# Patient Record
Sex: Female | Born: 1950
Health system: Southern US, Community
[De-identification: ages and names within clinical notes are randomized; demographics above are authoritative.]

## PROBLEM LIST (undated history)

## (undated) DIAGNOSIS — I739 Peripheral vascular disease, unspecified: Secondary | ICD-10-CM

## (undated) DIAGNOSIS — Z794 Long term (current) use of insulin: Secondary | ICD-10-CM

## (undated) DIAGNOSIS — K5730 Diverticulosis of large intestine without perforation or abscess without bleeding: Secondary | ICD-10-CM

## (undated) DIAGNOSIS — D649 Anemia, unspecified: Secondary | ICD-10-CM

## (undated) DIAGNOSIS — E119 Type 2 diabetes mellitus without complications: Secondary | ICD-10-CM

## (undated) DIAGNOSIS — Z862 Personal history of diseases of the blood and blood-forming organs and certain disorders involving the immune mechanism: Secondary | ICD-10-CM

## (undated) DIAGNOSIS — N812 Incomplete uterovaginal prolapse: Secondary | ICD-10-CM

## (undated) DIAGNOSIS — Z972 Presence of dental prosthetic device (complete) (partial): Secondary | ICD-10-CM

## (undated) DIAGNOSIS — N8110 Cystocele, unspecified: Secondary | ICD-10-CM

## (undated) DIAGNOSIS — Z8619 Personal history of other infectious and parasitic diseases: Secondary | ICD-10-CM

## (undated) DIAGNOSIS — M1990 Unspecified osteoarthritis, unspecified site: Secondary | ICD-10-CM

## (undated) DIAGNOSIS — G629 Polyneuropathy, unspecified: Secondary | ICD-10-CM

## (undated) DIAGNOSIS — Z973 Presence of spectacles and contact lenses: Secondary | ICD-10-CM

## (undated) DIAGNOSIS — G4733 Obstructive sleep apnea (adult) (pediatric): Secondary | ICD-10-CM

## (undated) DIAGNOSIS — K08109 Complete loss of teeth, unspecified cause, unspecified class: Secondary | ICD-10-CM

## (undated) DIAGNOSIS — G5600 Carpal tunnel syndrome, unspecified upper limb: Secondary | ICD-10-CM

## (undated) DIAGNOSIS — N182 Chronic kidney disease, stage 2 (mild): Secondary | ICD-10-CM

## (undated) DIAGNOSIS — I10 Essential (primary) hypertension: Secondary | ICD-10-CM

## (undated) DIAGNOSIS — J449 Chronic obstructive pulmonary disease, unspecified: Secondary | ICD-10-CM

## (undated) DIAGNOSIS — E785 Hyperlipidemia, unspecified: Secondary | ICD-10-CM

## (undated) DIAGNOSIS — G4730 Sleep apnea, unspecified: Secondary | ICD-10-CM

## (undated) DIAGNOSIS — I2510 Atherosclerotic heart disease of native coronary artery without angina pectoris: Secondary | ICD-10-CM

## (undated) HISTORY — PX: WRIST SURGERY: SHX841

## (undated) HISTORY — DX: Anemia, unspecified: D64.9

## (undated) HISTORY — DX: Type 2 diabetes mellitus without complications: E11.9

## (undated) HISTORY — DX: Peripheral vascular disease, unspecified: I73.9

## (undated) HISTORY — DX: Sleep apnea, unspecified: G47.30

## (undated) HISTORY — DX: Atherosclerotic heart disease of native coronary artery without angina pectoris: I25.10

## (undated) HISTORY — PX: APPENDECTOMY: SHX54

## (undated) HISTORY — DX: Carpal tunnel syndrome, unspecified upper limb: G56.00

## (undated) HISTORY — DX: Essential (primary) hypertension: I10

## (undated) HISTORY — DX: Unspecified osteoarthritis, unspecified site: M19.90

## (undated) HISTORY — PX: OTHER SURGICAL HISTORY: SHX169

## (undated) HISTORY — PX: BREAST EXCISIONAL BIOPSY: SUR124

## (undated) HISTORY — PX: CORONARY ANGIOPLASTY WITH STENT PLACEMENT: SHX49

## (undated) HISTORY — DX: Hyperlipidemia, unspecified: E78.5

## (undated) HISTORY — PX: TONSILLECTOMY: SUR1361

---

## 2001-06-23 HISTORY — PX: APPENDECTOMY: SHX54

## 2003-06-24 DIAGNOSIS — I2510 Atherosclerotic heart disease of native coronary artery without angina pectoris: Secondary | ICD-10-CM

## 2003-06-24 HISTORY — DX: Atherosclerotic heart disease of native coronary artery without angina pectoris: I25.10

## 2003-06-24 HISTORY — PX: CARPAL TUNNEL RELEASE: SHX101

## 2003-10-11 ENCOUNTER — Inpatient Hospital Stay (HOSPITAL_COMMUNITY): Admission: EM | Admit: 2003-10-11 | Discharge: 2003-10-12 | Payer: Self-pay | Admitting: Emergency Medicine

## 2004-02-07 ENCOUNTER — Ambulatory Visit (HOSPITAL_COMMUNITY): Admission: RE | Admit: 2004-02-07 | Discharge: 2004-02-07 | Payer: Self-pay | Admitting: Family Medicine

## 2004-03-14 ENCOUNTER — Ambulatory Visit (HOSPITAL_COMMUNITY): Admission: RE | Admit: 2004-03-14 | Discharge: 2004-03-14 | Payer: Self-pay | Admitting: Infectious Diseases

## 2004-05-19 ENCOUNTER — Inpatient Hospital Stay (HOSPITAL_COMMUNITY): Admission: EM | Admit: 2004-05-19 | Discharge: 2004-05-25 | Payer: Self-pay | Admitting: Emergency Medicine

## 2004-05-19 ENCOUNTER — Ambulatory Visit: Payer: Self-pay | Admitting: Internal Medicine

## 2004-05-19 ENCOUNTER — Ambulatory Visit: Payer: Self-pay | Admitting: Cardiology

## 2004-05-21 ENCOUNTER — Ambulatory Visit: Payer: Self-pay | Admitting: Cardiology

## 2004-05-21 ENCOUNTER — Ambulatory Visit: Payer: Self-pay | Admitting: Internal Medicine

## 2004-05-21 HISTORY — PX: CARDIAC CATHETERIZATION: SHX172

## 2004-05-23 DIAGNOSIS — Z955 Presence of coronary angioplasty implant and graft: Secondary | ICD-10-CM

## 2004-05-23 HISTORY — DX: Presence of coronary angioplasty implant and graft: Z95.5

## 2004-05-24 HISTORY — PX: CORONARY ANGIOPLASTY WITH STENT PLACEMENT: SHX49

## 2004-06-23 ENCOUNTER — Emergency Department (HOSPITAL_COMMUNITY): Admission: EM | Admit: 2004-06-23 | Discharge: 2004-06-23 | Payer: Self-pay | Admitting: Emergency Medicine

## 2004-06-25 ENCOUNTER — Ambulatory Visit: Payer: Self-pay | Admitting: Cardiology

## 2004-07-08 ENCOUNTER — Ambulatory Visit: Payer: Self-pay | Admitting: Dentistry

## 2004-07-08 ENCOUNTER — Encounter: Admission: RE | Admit: 2004-07-08 | Discharge: 2004-07-08 | Payer: Self-pay | Admitting: Dentistry

## 2004-07-30 ENCOUNTER — Ambulatory Visit: Payer: Self-pay | Admitting: Internal Medicine

## 2004-08-27 ENCOUNTER — Ambulatory Visit: Payer: Self-pay | Admitting: Cardiology

## 2004-08-27 ENCOUNTER — Inpatient Hospital Stay (HOSPITAL_COMMUNITY): Admission: EM | Admit: 2004-08-27 | Discharge: 2004-08-29 | Payer: Self-pay | Admitting: *Deleted

## 2004-09-17 ENCOUNTER — Ambulatory Visit: Payer: Self-pay | Admitting: Internal Medicine

## 2004-11-05 ENCOUNTER — Ambulatory Visit: Payer: Self-pay | Admitting: Internal Medicine

## 2005-01-06 ENCOUNTER — Emergency Department (HOSPITAL_COMMUNITY): Admission: EM | Admit: 2005-01-06 | Discharge: 2005-01-06 | Payer: Self-pay | Admitting: Emergency Medicine

## 2005-01-16 ENCOUNTER — Ambulatory Visit: Payer: Self-pay | Admitting: Cardiology

## 2005-01-29 ENCOUNTER — Emergency Department (HOSPITAL_COMMUNITY): Admission: EM | Admit: 2005-01-29 | Discharge: 2005-01-29 | Payer: Self-pay | Admitting: Family Medicine

## 2005-02-05 ENCOUNTER — Ambulatory Visit: Payer: Self-pay | Admitting: Internal Medicine

## 2005-04-02 ENCOUNTER — Ambulatory Visit: Payer: Self-pay | Admitting: Internal Medicine

## 2005-04-25 ENCOUNTER — Emergency Department (HOSPITAL_COMMUNITY): Admission: EM | Admit: 2005-04-25 | Discharge: 2005-04-25 | Payer: Self-pay | Admitting: *Deleted

## 2005-05-22 ENCOUNTER — Ambulatory Visit (HOSPITAL_COMMUNITY): Admission: RE | Admit: 2005-05-22 | Discharge: 2005-05-22 | Payer: Self-pay | Admitting: Internal Medicine

## 2005-06-05 ENCOUNTER — Ambulatory Visit: Payer: Self-pay | Admitting: Cardiology

## 2005-09-12 ENCOUNTER — Emergency Department (HOSPITAL_COMMUNITY): Admission: EM | Admit: 2005-09-12 | Discharge: 2005-09-12 | Payer: Self-pay | Admitting: Emergency Medicine

## 2005-09-18 ENCOUNTER — Emergency Department (HOSPITAL_COMMUNITY): Admission: EM | Admit: 2005-09-18 | Discharge: 2005-09-18 | Payer: Self-pay | Admitting: Emergency Medicine

## 2005-09-22 ENCOUNTER — Emergency Department (HOSPITAL_COMMUNITY): Admission: EM | Admit: 2005-09-22 | Discharge: 2005-09-22 | Payer: Self-pay | Admitting: Emergency Medicine

## 2006-04-20 ENCOUNTER — Ambulatory Visit: Payer: Self-pay | Admitting: Cardiovascular Disease

## 2006-04-20 ENCOUNTER — Observation Stay (HOSPITAL_COMMUNITY): Admission: EM | Admit: 2006-04-20 | Discharge: 2006-04-20 | Payer: Self-pay | Admitting: Emergency Medicine

## 2006-04-20 HISTORY — PX: CARDIAC CATHETERIZATION: SHX172

## 2006-04-22 ENCOUNTER — Ambulatory Visit: Payer: Self-pay | Admitting: Cardiology

## 2006-05-04 ENCOUNTER — Ambulatory Visit: Payer: Self-pay | Admitting: Cardiovascular Disease

## 2006-07-27 ENCOUNTER — Ambulatory Visit: Payer: Self-pay | Admitting: Cardiology

## 2006-07-27 ENCOUNTER — Emergency Department (HOSPITAL_COMMUNITY): Admission: EM | Admit: 2006-07-27 | Discharge: 2006-07-27 | Payer: Self-pay | Admitting: Emergency Medicine

## 2006-07-30 ENCOUNTER — Ambulatory Visit: Payer: Self-pay | Admitting: Cardiology

## 2006-10-08 ENCOUNTER — Emergency Department (HOSPITAL_COMMUNITY): Admission: EM | Admit: 2006-10-08 | Discharge: 2006-10-08 | Payer: Self-pay | Admitting: Emergency Medicine

## 2006-10-09 ENCOUNTER — Ambulatory Visit (HOSPITAL_COMMUNITY): Admission: RE | Admit: 2006-10-09 | Discharge: 2006-10-09 | Payer: Self-pay | Admitting: Internal Medicine

## 2007-02-03 ENCOUNTER — Ambulatory Visit (HOSPITAL_COMMUNITY): Admission: RE | Admit: 2007-02-03 | Discharge: 2007-02-03 | Payer: Self-pay | Admitting: Gastroenterology

## 2007-02-03 ENCOUNTER — Encounter (INDEPENDENT_AMBULATORY_CARE_PROVIDER_SITE_OTHER): Payer: Self-pay | Admitting: Gastroenterology

## 2007-05-05 ENCOUNTER — Encounter: Admission: RE | Admit: 2007-05-05 | Discharge: 2007-05-05 | Payer: Self-pay | Admitting: Internal Medicine

## 2007-07-16 ENCOUNTER — Ambulatory Visit: Payer: Self-pay | Admitting: Cardiovascular Disease

## 2007-07-16 ENCOUNTER — Observation Stay (HOSPITAL_COMMUNITY): Admission: EM | Admit: 2007-07-16 | Discharge: 2007-07-17 | Payer: Self-pay | Admitting: Emergency Medicine

## 2007-08-12 ENCOUNTER — Ambulatory Visit: Payer: Self-pay | Admitting: Cardiology

## 2007-10-22 ENCOUNTER — Emergency Department (HOSPITAL_COMMUNITY): Admission: EM | Admit: 2007-10-22 | Discharge: 2007-10-22 | Payer: Self-pay | Admitting: Emergency Medicine

## 2007-11-22 ENCOUNTER — Emergency Department (HOSPITAL_COMMUNITY): Admission: EM | Admit: 2007-11-22 | Discharge: 2007-11-22 | Payer: Self-pay | Admitting: Emergency Medicine

## 2008-03-06 ENCOUNTER — Ambulatory Visit (HOSPITAL_COMMUNITY): Admission: RE | Admit: 2008-03-06 | Discharge: 2008-03-06 | Payer: Self-pay | Admitting: Internal Medicine

## 2008-04-20 ENCOUNTER — Encounter: Admission: RE | Admit: 2008-04-20 | Discharge: 2008-04-20 | Payer: Self-pay | Admitting: Internal Medicine

## 2008-07-03 DIAGNOSIS — F17200 Nicotine dependence, unspecified, uncomplicated: Secondary | ICD-10-CM | POA: Insufficient documentation

## 2008-07-04 ENCOUNTER — Ambulatory Visit: Payer: Self-pay | Admitting: Cardiology

## 2008-07-05 DIAGNOSIS — I10 Essential (primary) hypertension: Secondary | ICD-10-CM | POA: Insufficient documentation

## 2008-07-05 DIAGNOSIS — I2510 Atherosclerotic heart disease of native coronary artery without angina pectoris: Secondary | ICD-10-CM | POA: Insufficient documentation

## 2008-07-07 ENCOUNTER — Ambulatory Visit: Payer: Self-pay

## 2008-07-15 ENCOUNTER — Emergency Department (HOSPITAL_COMMUNITY): Admission: EM | Admit: 2008-07-15 | Discharge: 2008-07-15 | Payer: Self-pay | Admitting: Emergency Medicine

## 2008-10-03 ENCOUNTER — Emergency Department (HOSPITAL_COMMUNITY): Admission: EM | Admit: 2008-10-03 | Discharge: 2008-10-03 | Payer: Self-pay | Admitting: Emergency Medicine

## 2008-12-27 ENCOUNTER — Encounter (INDEPENDENT_AMBULATORY_CARE_PROVIDER_SITE_OTHER): Payer: Self-pay | Admitting: Internal Medicine

## 2008-12-27 ENCOUNTER — Ambulatory Visit (HOSPITAL_COMMUNITY): Admission: RE | Admit: 2008-12-27 | Discharge: 2008-12-27 | Payer: Self-pay | Admitting: Internal Medicine

## 2008-12-27 ENCOUNTER — Ambulatory Visit: Payer: Self-pay | Admitting: Surgery

## 2009-02-19 ENCOUNTER — Encounter (INDEPENDENT_AMBULATORY_CARE_PROVIDER_SITE_OTHER): Payer: Self-pay | Admitting: *Deleted

## 2009-05-01 ENCOUNTER — Ambulatory Visit (HOSPITAL_COMMUNITY): Admission: RE | Admit: 2009-05-01 | Discharge: 2009-05-01 | Payer: Self-pay | Admitting: Internal Medicine

## 2009-05-04 ENCOUNTER — Ambulatory Visit: Payer: Self-pay | Admitting: Cardiovascular Disease

## 2009-05-04 ENCOUNTER — Inpatient Hospital Stay (HOSPITAL_COMMUNITY): Admission: EM | Admit: 2009-05-04 | Discharge: 2009-05-05 | Payer: Self-pay | Admitting: Emergency Medicine

## 2009-05-09 ENCOUNTER — Encounter: Admission: RE | Admit: 2009-05-09 | Discharge: 2009-05-09 | Payer: Self-pay | Admitting: Internal Medicine

## 2009-05-09 ENCOUNTER — Telehealth (INDEPENDENT_AMBULATORY_CARE_PROVIDER_SITE_OTHER): Payer: Self-pay

## 2009-05-10 ENCOUNTER — Encounter: Payer: Self-pay | Admitting: Cardiology

## 2009-05-10 ENCOUNTER — Encounter (HOSPITAL_COMMUNITY): Admission: RE | Admit: 2009-05-10 | Discharge: 2009-06-22 | Payer: Self-pay | Admitting: Cardiology

## 2009-05-10 ENCOUNTER — Ambulatory Visit: Payer: Self-pay | Admitting: Internal Medicine

## 2009-05-10 ENCOUNTER — Ambulatory Visit: Payer: Self-pay

## 2009-05-22 ENCOUNTER — Encounter: Payer: Self-pay | Admitting: Internal Medicine

## 2009-10-30 ENCOUNTER — Ambulatory Visit: Payer: Self-pay | Admitting: Internal Medicine

## 2009-10-30 ENCOUNTER — Encounter (INDEPENDENT_AMBULATORY_CARE_PROVIDER_SITE_OTHER): Payer: Self-pay | Admitting: Family Medicine

## 2009-10-30 LAB — CONVERTED CEMR LAB
ALT: 13 units/L (ref 0–35)
Alkaline Phosphatase: 95 units/L (ref 39–117)
BUN: 10 mg/dL (ref 6–23)
Basophils Absolute: 0 10*3/uL (ref 0.0–0.1)
Basophils Relative: 0 % (ref 0–1)
CO2: 28 meq/L (ref 19–32)
Calcium: 9 mg/dL (ref 8.4–10.5)
Chloride: 105 meq/L (ref 96–112)
Eosinophils Absolute: 0 10*3/uL (ref 0.0–0.7)
Eosinophils Relative: 0 % (ref 0–5)
Glucose, Bld: 75 mg/dL (ref 70–99)
HCT: 37.7 % (ref 36.0–46.0)
HDL: 66 mg/dL (ref >39)
Hemoglobin: 12.2 g/dL (ref 12.0–15.0)
Lymphs Abs: 2.7 10*3/uL (ref 0.7–4.0)
MCV: 75.2 fL — ABNORMAL LOW (ref 78.0–100.0)
Neutro Abs: 2.7 10*3/uL (ref 1.7–7.7)
Potassium: 4 meq/L (ref 3.5–5.3)
RBC: 5.01 M/uL (ref 3.87–5.11)
RDW: 16 % — ABNORMAL HIGH (ref 11.5–15.5)
TSH: 0.71 microintl units/mL (ref 0.350–4.500)
Total Bilirubin: 0.3 mg/dL (ref 0.3–1.2)
Total CHOL/HDL Ratio: 2.6
Triglycerides: 77 mg/dL (ref <150)

## 2009-10-31 ENCOUNTER — Ambulatory Visit: Payer: Self-pay | Admitting: Internal Medicine

## 2009-11-28 ENCOUNTER — Ambulatory Visit: Payer: Self-pay | Admitting: Internal Medicine

## 2009-12-17 ENCOUNTER — Ambulatory Visit: Payer: Self-pay | Admitting: Internal Medicine

## 2010-02-11 ENCOUNTER — Ambulatory Visit: Payer: Self-pay | Admitting: Internal Medicine

## 2010-02-22 ENCOUNTER — Ambulatory Visit: Payer: Self-pay | Admitting: Internal Medicine

## 2010-02-23 ENCOUNTER — Emergency Department (HOSPITAL_COMMUNITY): Admission: EM | Admit: 2010-02-23 | Discharge: 2010-02-24 | Payer: Self-pay | Admitting: Emergency Medicine

## 2010-05-02 ENCOUNTER — Ambulatory Visit (HOSPITAL_COMMUNITY): Admission: RE | Admit: 2010-05-02 | Discharge: 2010-05-02 | Payer: Self-pay | Admitting: Internal Medicine

## 2010-05-28 ENCOUNTER — Ambulatory Visit (HOSPITAL_COMMUNITY)
Admission: RE | Admit: 2010-05-28 | Discharge: 2010-05-28 | Payer: Self-pay | Source: Home / Self Care | Admitting: Internal Medicine

## 2010-07-14 ENCOUNTER — Encounter: Payer: Self-pay | Admitting: Internal Medicine

## 2010-09-16 ENCOUNTER — Encounter (INDEPENDENT_AMBULATORY_CARE_PROVIDER_SITE_OTHER): Payer: Self-pay | Admitting: *Deleted

## 2010-09-16 LAB — CONVERTED CEMR LAB
ALT: 15 units/L (ref 0–35)
Alkaline Phosphatase: 97 units/L (ref 39–117)
Basophils Absolute: 0 10*3/uL (ref 0.0–0.1)
Basophils Relative: 0 % (ref 0–1)
CO2: 28 meq/L (ref 19–32)
Calcium: 9.7 mg/dL (ref 8.4–10.5)
Cholesterol: 155 mg/dL (ref 0–200)
Eosinophils Absolute: 0 10*3/uL (ref 0.0–0.7)
Eosinophils Relative: 1 % (ref 0–5)
Hemoglobin: 12.2 g/dL (ref 12.0–15.0)
Hgb A1c MFr Bld: 6.3 % — ABNORMAL HIGH (ref <5.7)
LDL Cholesterol: 65 mg/dL (ref 0–99)
Monocytes Absolute: 0.6 10*3/uL (ref 0.1–1.0)
Monocytes Relative: 9 % (ref 3–12)
Platelets: 283 10*3/uL (ref 150–400)
RBC: 5.11 M/uL (ref 3.87–5.11)
Sodium: 143 meq/L (ref 135–145)
Total Bilirubin: 0.3 mg/dL (ref 0.3–1.2)
Triglycerides: 197 mg/dL — ABNORMAL HIGH (ref <150)
VLDL: 39 mg/dL (ref 0–40)
WBC: 6.6 10*3/uL (ref 4.0–10.5)

## 2010-09-25 LAB — BASIC METABOLIC PANEL
BUN: 20 mg/dL (ref 6–23)
Creatinine, Ser: 1.04 mg/dL (ref 0.4–1.2)
Glucose, Bld: 81 mg/dL (ref 70–99)
Sodium: 142 mEq/L (ref 135–145)

## 2010-09-25 LAB — CK TOTAL AND CKMB (NOT AT ARMC)
CK, MB: 1.7 ng/mL (ref 0.3–4.0)
Relative Index: 1.1 (ref 0.0–2.5)
Total CK: 149 U/L (ref 7–177)

## 2010-09-25 LAB — DIFFERENTIAL
Basophils Absolute: 0 10*3/uL (ref 0.0–0.1)
Basophils Relative: 0 % (ref 0–1)
Eosinophils Relative: 2 % (ref 0–5)
Lymphs Abs: 3.1 10*3/uL (ref 0.7–4.0)
Monocytes Absolute: 0.7 10*3/uL (ref 0.1–1.0)
Monocytes Relative: 9 % (ref 3–12)
Neutro Abs: 4.2 10*3/uL (ref 1.7–7.7)
Neutrophils Relative %: 51 % (ref 43–77)

## 2010-09-25 LAB — CARDIAC PANEL(CRET KIN+CKTOT+MB+TROPI)
Relative Index: 1 (ref 0.0–2.5)
Total CK: 130 U/L (ref 7–177)
Troponin I: 0.01 ng/mL (ref 0.00–0.06)

## 2010-09-25 LAB — LIPID PANEL
HDL: 58 mg/dL (ref >39)
Total CHOL/HDL Ratio: 3.1 RATIO
VLDL: 34 mg/dL (ref 0–40)

## 2010-09-25 LAB — POCT CARDIAC MARKERS
CKMB, poc: 1.4 ng/mL (ref 1.0–8.0)
CKMB, poc: 1.5 ng/mL (ref 1.0–8.0)
Myoglobin, poc: 64.2 ng/mL (ref 12–200)
Troponin i, poc: <0.05 ng/mL (ref 0.00–0.09)
Troponin i, poc: <0.05 ng/mL (ref 0.00–0.09)

## 2010-09-25 LAB — GLUCOSE, CAPILLARY
Glucose-Capillary: 127 mg/dL — ABNORMAL HIGH (ref 70–99)
Glucose-Capillary: 132 mg/dL — ABNORMAL HIGH (ref 70–99)

## 2010-09-25 LAB — HEMOGLOBIN A1C
Hgb A1c MFr Bld: 6.3 % — ABNORMAL HIGH (ref 4.6–6.1)
Mean Plasma Glucose: 134 mg/dL

## 2010-09-25 LAB — T4, FREE: Free T4: 1.3 ng/dL (ref 0.80–1.80)

## 2010-09-25 LAB — CBC
Platelets: 248 10*3/uL (ref 150–400)
RBC: 4.82 MIL/uL (ref 3.87–5.11)
WBC: 8.2 10*3/uL (ref 4.0–10.5)

## 2010-10-02 LAB — POCT CARDIAC MARKERS
CKMB, poc: <1 ng/mL — ABNORMAL LOW (ref 1.0–8.0)
Myoglobin, poc: 40.6 ng/mL (ref 12–200)
Myoglobin, poc: 41.1 ng/mL (ref 12–200)

## 2010-10-02 LAB — DIFFERENTIAL
Basophils Relative: 1 % (ref 0–1)
Eosinophils Absolute: 0.2 10*3/uL (ref 0.0–0.7)
Eosinophils Relative: 2 % (ref 0–5)
Lymphs Abs: 2.8 10*3/uL (ref 0.7–4.0)
Monocytes Relative: 6 % (ref 3–12)
Neutrophils Relative %: 52 % (ref 43–77)

## 2010-10-02 LAB — CBC
HCT: 34.5 % — ABNORMAL LOW (ref 36.0–46.0)
MCHC: 32.5 g/dL (ref 30.0–36.0)
MCV: 76.8 fL — ABNORMAL LOW (ref 78.0–100.0)
Platelets: 242 10*3/uL (ref 150–400)
RBC: 4.49 MIL/uL (ref 3.87–5.11)

## 2010-10-02 LAB — BASIC METABOLIC PANEL
BUN: 18 mg/dL (ref 6–23)
CO2: 30 mEq/L (ref 19–32)
Chloride: 103 mEq/L (ref 96–112)
Creatinine, Ser: 0.94 mg/dL (ref 0.4–1.2)
Potassium: 3.2 mEq/L — ABNORMAL LOW (ref 3.5–5.1)

## 2010-10-07 LAB — URINALYSIS, ROUTINE W REFLEX MICROSCOPIC
Bilirubin Urine: NEGATIVE
Glucose, UA: NEGATIVE mg/dL
Hgb urine dipstick: NEGATIVE
Ketones, ur: NEGATIVE mg/dL
Nitrite: NEGATIVE
Protein, ur: NEGATIVE mg/dL
Specific Gravity, Urine: 1.014 (ref 1.005–1.030)
Urobilinogen, UA: 0.2 mg/dL (ref 0.0–1.0)
pH: 7 (ref 5.0–8.0)

## 2010-10-07 LAB — URINE MICROSCOPIC-ADD ON

## 2010-11-05 NOTE — Assessment & Plan Note (Signed)
[facility]                            CARDIOLOGY OFFICE NOTE   NAME:[name] [name], [name] [name]                       MRN:          [id number]  DATE:07/04/2008                            DOB:          Sep 23, 1950    PRIMARY CARE PHYSICIAN:  Dr. Candyce Churn. Allyne Gee, M.D.   REASON FOR PRESENTATION:  Evaluate patient with coronary disease and  chest pain.   HISTORY OF PRESENT ILLNESS:  The patient presents for followup of the  above.  She is 60 years old now.  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  She was complaining of stress and fatigue and some  chest discomfort.  She was added to this schedule.  She said that she  had some chest discomfort in mid-December.  She actually takes  nitroglycerin.  She took 2 in 1 day.  She has not had to have any since  then.  She said this was a mild discomfort.  It was somewhat sharp.  She  was not sure whether this was similar to the previous angina.  There  were no associated symptoms.  It did go away after the second  nitroglycerin.  This was happening at rest.  She states she has been  under a lot of stress with her children and grandchildren.  She was  tearful talking about this.  She has not been particularly physically  active.  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  She feels chronically fatigued.  She says she is having  shortness of breath with moderate activity.  She is not having any  shortness of breath at rest and she is not describing any PND or  orthopnea.  She does continue to smoke about 3 cigarettes a day,  unfortunately.   PAST MEDICAL HISTORY:  Coronary artery disease. (40% left main stenosis,  LAD 50% - 60% stenosis, circumflex 30% proximal stenosis, right coronary  artery 80% stenosis followed by 50%, followed by 60% stenosis.  EF of  60%.  She had a Cypher stent placed in her right coronary artery in  March 2005.  She had a followup catheterization in 2007,  demonstrating  the stent to be widely patent).  1. Iron deficiency anemia.  2. Arthritis.  3. Hypertension.  4. Ongoing tobacco use.  5. Carpal tunnel syndrome.  6. Diabetes.   PAST SURGICAL HISTORY:  Appendectomy.   ALLERGIES:  None.   MEDICATIONS:  1. Calcium.  2. Multivitamins.  3. Aspirin 81 mg daily.  4. Lopressor 50 mg b.i.d.  5. Iron.  6. Crestor 10 mg daily.  7. Janumet.  8. Benicar 40/25 daily.  9. Amlodipine 10 mg daily.   REVIEW OF SYSTEMS:  As stated in the HPI, and otherwise negative for all  other systems.   PHYSICAL EXAMINATION:  GENERAL:  The patient is pleasant and in no  distress.  VITAL SIGNS:  Blood pressure 130/60, heart rate 70 and regular.  HEENT:  Eyes are unremarkable; pupils equal, round, and reactive to  light; fundi not visualized, oral mucosa unremarkable.  NECK:  No jugular venous distension at 45 degrees, carotid upstroke  brisk and symmetrical.  No bruits.  No thyromegaly.  LYMPHATICS:  No cervical, axillary, or inguinal adenopathy.  LUNGS:  Clear to auscultation bilaterally.  BACK:  No costovertebral angle tenderness.  CHEST:  Unremarkable.  HEART:  PMI not displaced or sustained.  S1 and S2 are within normal  limits.  No S3, no S4.  No clicks, no rubs, no murmurs.  ABDOMEN:  Flat, positive bowel sounds, normal in frequency and pitch.  No bruits, no rebound, no guarding.  No midline pulsatile mass.  No  hepatomegaly or splenomegaly.  SKIN:  No rashes, no nodules.  EXTREMITIES:  Pulses 2+, varicose veins.  No cyanosis, no clubbing, no  edema.  NEURO:  Oriented to person, place, and time.  Cranial nerves II through  XII grossly intact.  Motor grossly intact.   ASSESSMENT/PLAN:  1. Chest.  This patient does have chest discomfort with ongoing      cardiovascular risk factors and known coronary disease.  The pre-      test probability of obstructive coronary artery disease is at least      moderate.  Therefore, she will have a stress  perfusion study.      Further evaluation will be based on these results.  She will      continue on progressive risk reduction.  2. Dyslipidemia per Dr. Allyne Gee.  The goal will be an LDL less than      100 and HDL greater than 50.  3. Tobacco abuse.  I discussed with her the need to stop smoking.  I      spent greater than 3 minutes discussing this.  She does not want to      try Chantix, because she says  I am already depressed.  4. Depression.  I have asked her to talk this over with her primary      care doctor.  I do think that the stress of the graveyard shift      along with her medical problems is not healthy for her, and I have      written a letter to [employer] at [facility], to see if she can get      moved to a different shift.  5. Followup.  I will see her again in 6 months or sooner if needed.    Rollene Rotunda, MD, [facility]  Electronically Signed   JH/MedQ  DD: 07/06/2008  DT: 07/07/2008  Job #: [id number]   cc:   Candyce Churn. Allyne Gee, M.D.

## 2010-11-05 NOTE — Discharge Summary (Signed)
NAME:  [name] [name], [name] [name]              ACCOUNT NO.:  [id number]   MEDICAL RECORD NO.:  [id number]          PATIENT TYPE:  INP   LOCATION:  3736                         FACILITY:  [facility]   PHYSICIAN:  Peter C. Eden Emms, MD, FACCDATE OF BIRTH:  1951-04-11   DATE OF ADMISSION:  07/16/2007  DATE OF DISCHARGE:  07/17/2007                               DISCHARGE SUMMARY   PRIMARY CARDIOLOGIST:  Rollene Rotunda, MD.   PRIMARY CARE PHYSICIAN:  [name], MD.   PROCEDURES PERFORMED DURING HOSPITALIZATION:  None.   FINAL DISCHARGE DIAGNOSES:  1. Noncardiac chest pain.  2. Known coronary artery disease status post diethylstilbestrol to the      right coronary artery in December of 2005.      a.     Last cardiac catheterization April 20, 2006, revealing       minimal coronary artery disease with patent right coronary artery       stent.      b.     Last stress Myoview dated August 27, 2004, revealing an       ejection fraction of 66 percent with no ischemia.   SECONDARY DIAGNOSES:  1. Iron deficiency anemia.      a.     Status post esophagogastroduodenoscopy on January 24, 2007,       revealing small hiatal hernia.      b.     Status post colonoscopy August 05, 2006, revealing colon       polyps.  2. Arthritis.  3. Hypertension.  4. Hyperlipidemia.  5. Type 2 diabetes.  6. Carpal tunnel.  7. Chronic varicose veins.   HOSPITAL COURSE:  This is a 60 year old African-American female who  presented to [facility] Emergency Room with complaints of chest pain  described as left chest discomfort under breasts, radiating to left  shoulder and arm, occurring x2 weeks and described as sharp, lasting a  minute to 2 minutes several times a day, with and without exertion.  Nitroglycerin gave minimal relief.  She began to feel uncomfortable  around 4:30 in the morning and continued to have discomfort until 6:30  in the morning and came to the emergency room.  The patient admits to  social issues.   Stressors include finances and current bills and a  recent death of her sister.  The patient was admitted to rule out  myocardial infarction with cardiac enzymes cycled.  Troponin was  negative at 0.05 initially.  Followup troponins were also negative at  0.01, 0.01 and 0.01.  The patient also was checked for occult blood and  was found to be negative.  The patient's hemoglobin A1c was also checked  and was found to be high normal at 6.1.  The patients EKG revealed  nothing acute as far as ischemic event.  EKG revealed normal sinus  rhythm.   The patient was seen and examined by Dr. Charlton Haws on day of  discharge, found to be stable with blood pressure 129/85, heart rate 63,  respirations 20.  The patient had no further chest discomfort and was  diagnosed with atypical chest pain  not related to CAD.  The patient will  follow up with Dr. Antoine Poche in a couple of weeks post discharge and  continue her current medications.   DISCHARGE LABS:  Cholesterol 94, lipids 105, HDL 22, LDL 51, sodium 139,  potassium 3.4, chloride 101, CO2 29, glucose 140, BUN 13, creatinine  0.93.  Hemoglobin 11.1, hematocrit 34.4, white blood cells 7.7 and  platelets 273,000.  PTT 33, PT 12.6, INR 0.9.   VITAL SIGNS ON DISCHARGE:  As described.   DISCHARGE MEDICATIONS:  1. Metoprolol 50 mg twice a day.  2. DynaCirc CR 10 mg daily.  3. Benicar/HCTZ 40/25 mg daily.  4. Crestor 10 mg daily.  5. Feosol 325 mg three times a day.  6. Janumet 500/100 daily.  7. Bayer aspirin 325 mg daily.  8. Os-Cal D 500 mg daily.   ALLERGIES:  No known drug allergies.   FOLLOWUP PLANS AND APPOINTMENT:  1. The patient will follow with Dr. Antoine Poche in approximately 2 weeks.      Our office will call, as this is a weekend and they will schedule      that appointment for her by phone.  2. She is to follow up with her primary care physician for continued      medical management.   Time spent with the patient, to include  physician time, 30 minutes.      Bettey Mare. Lyman Bishop, NP      Noralyn Pick. Eden Emms, MD, [facility]  Electronically Signed    KML/MEDQ  D:  07/17/2007  T:  07/18/2007  Job:  [id number]   cc:   Candyce Churn. Allyne Gee, M.D.

## 2010-11-05 NOTE — Assessment & Plan Note (Signed)
[facility]                            CARDIOLOGY OFFICE NOTE   NAME:[name] [name], [name] [name]                       MRN:          [id number]  DATE:08/12/2007                            DOB:          10-08-1950    REASON FOR PRESENTATION:  Evaluate the patient with recent  hospitalization chest pain.   HISTORY OF PRESENT ILLNESS:  The patient was hospitalized on January 23  overnight for chest discomfort.  She had no objective evidence of  ischemia.  No further testing was done at that time.  Since then she has  had no further chest discomfort.  She has had no shortness of breath.  Denies any PND or orthopnea.  She had no palpitations, presyncope or  syncope.   PAST MEDICAL HISTORY:  Coronary artery disease (40% left main stenosis,  LAD 50-60% stenosis, circumflex 30% proximal stenosis, right coronary  artery 80% stenosis, followed by 50% followed by 60% stenosis.  EF 60%.  The patient has a CYPHER stent placed her right coronary artery March  2005.  She had a follow-up catheterization 2007 demonstrating the stent  to be wide open), iron-deficiency anemia, arthritis, hypertension,  tobacco use, carpal tunnel syndrome, appendectomy.   ALLERGIES:  None.   MEDICATIONS:  Calcium, multivitamin, aspirin, Lopressor 50 mg b.i.d.,  iron, Crestor 10 mg daily, Janumet, Benicar 40/25 daily, amlodipine 10  mg daily.   REVIEW OF SYSTEMS:  As stated in the HPI otherwise negative for other  systems.   PHYSICAL EXAMINATION:  The patient is in no distress.  Blood pressure 140/100, heart rate 70 and regular.  HEENT:  Eyelids unremarkable.  Pupils are equal, round, and reactive to  light and accommodation.  Fundi are not visualized.  Oral mucosa  unremarkable.  NECK:  No jugular venous distension at 45 degrees, carotid upstroke  brisk and symmetric, no bruits, thyromegaly.  LYMPHATICS:  No cervical, axillary, or inguinal adenopathy.  LUNGS:  Clear to auscultation  bilaterally.  BACK:  No costovertebral angle tenderness.  CHEST:  Unremarkable.  HEART:  PMI not displaced or sustained, S1 and S2 within normal limits,  no S3, no S4, no clicks, rubs, murmurs.  ABDOMEN:  Flat, positive bowel sounds, normal in frequency and pitch, no  bruits, rebound, guarding.  No midline pulsatile mass, hepatomegaly,  splenomegaly.  SKIN:  No rashes, no nodules.  EXTREMITIES:  With 2+ pulses throughout, no edema, cyanosis, clubbing.  NEURO:  Oriented to person, place, and time, cranial nerves 2-12 grossly  intact, motor grossly intact.    EKG sinus rhythm, rate 70, axis within normal limits, intervals within  normal limits, no acute ST-T wave changes.   ASSESSMENT/PLAN:  1. Coronary disease.  Patient is having no ongoing symptoms.  No      further cardiovascular testing is suggested.  She will continue      with risk reduction.  2. Hypertension.  Blood pressure is mildly elevated but had not been      on previous evaluations in the hospital.  She can keep a check on      this at home.  She does report that it is typically in the 120/70s      when she takes it at home.  She keep a blood pressure diary and      present this to her primary care doctor.  3. Follow-up.  I plan on seeing her back in 1 year or sooner if      needed.     Rollene Rotunda, MD, [facility]  Electronically Signed    JH/MedQ  DD: 08/12/2007  DT: 08/13/2007  Job #: [id number]   cc:   Candyce Churn. Allyne Gee, M.D.

## 2010-11-05 NOTE — H&P (Signed)
NAME:  [name] [name], [name] [name]              ACCOUNT NO.:  [id number]   MEDICAL RECORD NO.:  [id number]          PATIENT TYPE:  INP   LOCATION:  1846                         FACILITY:  [facility]   PHYSICIAN:  Veverly Fells. Excell Seltzer, MD  DATE OF BIRTH:  [birth year]   DATE OF ADMISSION:  07/16/2007  DATE OF DISCHARGE:                              HISTORY & PHYSICAL   SUMMARY OF HISTORY:  Ms. Dacey is a 60 year old African American  female who presents to [facility] Emergency Room complaining of chest  discomfort.  Her history is very vague and somewhat inconsistent.  However, she does describe a 2-week history of sharp, left-sided chest  discomfort underneath her breast radiating into her left shoulder and  arm.  She states at first that these discomforts last less than a  minute.  However, then she reiterates 15 minutes and then 30 minutes.  The frequency is unclear, however, it seems that she may be having these  up to 20 times a day and can occur anytime.  It is not necessarily  precipitated by exertion as they have occurred at rest, with exertion,  and nocturnally.  She has tried nitroglycerin on 2 occasions with  questionable relief.  She is not able to rate the discomfort.  She  describes it is difficult to catch her breath during these episodes.  It  is not clear if she actually short of breath.  She denied any nausea,  vomiting, or diaphoresis.  Today, towards the end of her work at  approximately 4:30 a.m., she stated that she became very uncomfortable  and started having similar chest discomfort as above.  When she got off  work at 6:30, the feelings were still there so as she was walking out of  the hospital she decided to come to the emergency room for further  evaluation.   In the emergency room, EKG and point of care marker were unremarkable.  She is tearful at the time of interview and she states that she is under  a lot of stress given the death of her sister in Aug 29, 2008which  sounds like sudden death, work situation, her sister's diagnosis of  cancer, finances, and multiple bill problems.  She states that she has  sold her house to try to downsize and is trying to manage her finances.  However, initially she states that she is taking her medications,  however, then later states that she has not been taking some of her  medications at least for 2 weeks.  These include Plavix and Lopressor,  as well as Crestor.   PAST MEDICAL HISTORY:  NO KNOWN DRUG ALLERGIES.   MEDICATIONS PRIOR TO ADMISSION:  Supposed to include:  1. Plavix 75 mg daily.  2. Lopressor 50 mg b.i.d.  3. DynaCirc CR 10 mg daily.  4. Benicar 40/25 daily.  5. Crestor 10 daily.  6. Iron 325, unknown frequency.  7. Possibly vitamin D she describes a recent prescription but has not      had this filled.  8. Janumet 50/1000 mg, unknown frequency.  9. Aspirin 325 daily.  10.Calcium, unknown  dosage and frequency.  11.It is not specifically clear from the patient as to which      medications that she is actually taking on a regular basis.   PAST MEDICAL HISTORY:  Notable for:  1. Iron-deficiency anemia with a recent evaluation by Dr. Loreta Ave with an      EGD and colonoscopy in august 2008.  This did show some small      hiatal hernia and 3 colon polyps which were biopsied.  2. She has a history of arthritis.  3. Hypertension.  4. Hyperlipidemia, unknown last check.  5. Type 2 diabetes for which her sugars range from 70 to 180 at home.  6. She has known coronary artery disease with a Cypher drug-eluting      stent to the RCA in December 2005, last catheterization by Dr.      Samule Ohm in October 2007 describes minimal coronary artery disease      with a patent RCA stent.  Her last stress Myoview was on August 27, 2004, showed an EF of 66%, no ischemia.  7. She has a history of varicose veins.  8. Carpal tunnel.  9. Status post appendectomy.   SOCIAL HISTORY:  She resides in [place] alone in an  apartment.  One  son is deceased at age 81 with a motor vehicle accident.  One daughter  is alive.  She has 13 grandchildren and 4 great grandchildren.  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  She  smokes approximately 1 pack every 3 days and has been doing so for 25  years.  She denies any alcohol for a long time or drug use for a long  time.  She denies any herbal medications.  She tries to maintain a low-  salt ADA diet.  She states that she uses her abdominal lounge machine  and walks without difficulty.   FAMILY HISTORY:  Her mother is deceased at age 57 with a history of  heart problems and diabetes.  Her father is deceased at age 58 with  myocardial infarction, hypertension, diabetes.  She has 4 sisters that  are living, one with a history of hypertension, stomach problems,  another was recently diagnosed with some type of cancer.  She has 1  brother who is deceased of a myocardial infarction.  She has a brother  that is living, unknown specific health.  She has 1 sister deceased with  heart problems.   REVIEW OF SYSTEMS:  In addition to the above, is notable for occasional  headaches, very poor dentition, and she states she needs several teeth  pulled but has not been able to come off Plavix to have this done,  glasses, edema which decreases during sleep, positive snoring, post  menopausal, numbness in her lower extremities, left worse than the  right, depression, anxiety, arthralgias in her hands, and occasional  constipation secondary to her medications.   PHYSICAL EXAM:  GENERAL:  Well-nourished, well-developed, pleasant,  African American female in no apparent distress.  On interview in the  emergency room, she becomes very tearful talking about her social  stressors.  Temperature is 98.1.  Blood pressure 131/77.  Pulse 56 and  regular.  Respirations 22 and regular and 98% sats on room air.  HEENT:  Unremarkable except for poor dentition.   NECK:  Supple without thyromegaly, adenopathy, JVD, or carotid bruits.  CHEST:  Symmetrical excursion.  Lung sounds are diminished  but clear to  auscultation without rales, rhonchi, or wheezing.  HEART:  PMI is not displaced.  Regular rate and rhythm.  I do not  appreciate S3, S4, murmurs, rubs, clicks, or gallops.  All pulses are  symmetrical and nontender so I did not appreciate any abdominal or  femoral bruits.  SKIN:  Integument is also intact.  ABDOMEN:  Rounded.  Bowel sounds present without organomegaly, masses,  or tenderness.  EXTREMITIES:  Negative for cyanosis, clubbing, or edema.  MUSCULOSKELETAL:  Unremarkable.  NEURO:  Unremarkable.   Chest x-ray shows cardiomegaly without signs of any acute findings or  edema.  EKG shows sinus bradycardia, normal axis, normal intervals.  She  does have a delayed R-wave but no essential change from September 07, 2006.  H&H is 11.1 and 34.4, normal indices, platelets 273, WBC 7.7, sodium  139, potassium 34, BUN 13, creatinine 0.93, glucose 140, normal LFTs,  PTT 33, point of care marker negative x1.   IMPRESSION:  1. Atypical chest discomfort with vague history.  2. Noncompliance with medications secondary to finances.  3. Multiple social and financial stressors.  4. History as noted per past medical history.   DISPOSITION:  Dr. Excell Seltzer reviewed the patient's history, spoke with, and  examined the patient and agrees with the above.  We will admit her  overnight for observation.  I think her main issue is mostly financial  and social stressors.  Her chest discomfort does not appear to be  cardiac in etiology.  However, if she does rule out for myocardial  infarction, we will discharge her home.  I have already arranged an  appointment with Dr. Antoine Poche for February 19th at 4 p.m.  We have asked  case management to see her today to assist with financial and possibly  social needs.  As she has been on the Plavix since 2005, we will   discontinue this and change her medications to more affordable  substitutes.  Dr. Excell Seltzer will discuss this with Dr. Allyne Gee.      Joellyn Rued, PA-C      Veverly Fells. Excell Seltzer, MD  Electronically Signed    EW/MEDQ  D:  07/16/2007  T:  07/16/2007  Job:  [id number]   cc:   Candyce Churn. Allyne Gee, M.D.  Dr. _____

## 2010-11-05 NOTE — Op Note (Signed)
NAME:  [name] [name], [name] [name]              ACCOUNT NO.:  [id number]   MEDICAL RECORD NO.:  [id number]          PATIENT TYPE:  AMB   LOCATION:  ENDO                         [facility]AC[facility]   PHYSICIAN:  Anselmo Rod, M.D.  DATE OF BIRTH:  1950-09-15   DATE OF PROCEDURE:  02/03/2007  DATE OF DISCHARGE:  02/03/2007                               OPERATIVE REPORT   PROCEDURE PERFORMED:  Esophagogastroduodenoscopy with multiple cold  biopsies.   ENDOSCOPIST:  Anselmo Rod, M.D.   INSTRUMENT USED:  Pentax video panendoscope.   INDICATIONS FOR PROCEDURE:  A 60 year old African American female  undergoing EGD.  The patient had a history of blood in stool and iron  deficiency anemia.  Rule out peptic ulcer disease, esophagitis,  gastritis, etc.   PREPROCEDURE PREPARATION:  Informed consent was procured from the  patient.  The patient fasted for 8 hours prior to the procedure.  Risks  and benefits of the procedure were discussed with the patient in great  detail.   PREPROCEDURE PHYSICAL:  The patient had stable vital signs.  Neck  supple.  Chest clear to auscultation.  S1, S2 regular.  Abdomen soft  with normal bowel sounds.   DESCRIPTION OF PROCEDURE:  The patient was placed in left lateral  decubitus position and sedated with 75 mcg of Fentanyl and 5 mg of  Versed given intravenously in slow incremental doses. Once the patient  was adequately sedated and maintained on low-flow oxygen and continuous  cardiac monitoring, the Pentax video panendoscope was advanced through  the mouthpiece over the tongue into the esophagus under direct vision. A  small papilloma was biopsied from the proximal esophagus at 20 cm.  The  scope was then advanced into the distal esophagus.  The rest of the  esophageal mucosa appeared healthy.  On advancing the scope into the  stomach, a small hiatal hernia was seen on high retroflexion.  There was  evidence of gastritis.  No ulcers, erosions, masses, polyps  were seen.  The proximal small bowel appeared normal.  Small-bowel biopsies were  done to rule out sprue.  The patient tolerated the procedure well  without immediate complications.   IMPRESSION:  1. Normal-appearing esophagus except for a small papilloma biopsied      from 20 cm.  2. Small hiatal hernia seen on high retroflexion.  3. Mild diffuse gastritis was no ulcers, erosions, masses or polyps      seen.  4. Normal proximal small bowel.  Small-bowel biopsies done to rule out      sprue.   RECOMMENDATIONS:  1. Await pathology results.  2. Avoid all nonsteroidals including aspirin for the next four weeks.  3. Proceed with a colonoscopy at this time.  4. Further recommendations made thereafter.      Anselmo Rod, M.D.  Electronically Signed     JNM/MEDQ  D:  02/03/2007  T:  02/05/2007  Job:  [id number]   cc:   Candyce Churn. Allyne Gee, M.D.  Fax: [phone]

## 2010-11-05 NOTE — Op Note (Signed)
NAME:  [name] [name], [name] [name]              ACCOUNT NO.:  [id number]   MEDICAL RECORD NO.:  [id number]          PATIENT TYPE:  AMB   LOCATION:  ENDO                         FACILITY:  [facility]   PHYSICIAN:  Anselmo Rod, M.D.  DATE OF BIRTH:  [birth year]   DATE OF PROCEDURE:  02/03/2007  DATE OF DISCHARGE:  02/03/2007                               OPERATIVE REPORT   PROCEDURE PERFORMED:  Colonoscopy with cold biopsies x 3.   ENDOSCOPIST:  Anselmo Rod, M.D.   INSTRUMENT USED:  Pentax video colonoscope.   INDICATIONS FOR PROCEDURE:  A 60 year old [place] American female with a  history of hypertension and diabetes, undergoing screening colonoscopy.  The patient has a history of iron-deficiency anemia and occasional  rectal bleeding rule out colonic polyps, masses, etc.   PREPROCEDURE PREPARATION:  Informed consent was procured from the  patient.  The patient fasted for 8 hours prior to the procedure and  prepped with a bottle of magnesium citrate and gallon of NuLytely the  night prior to the procedure.  The risks and benefits of the procedure  including a 10% miss rate of cancer and polyps were discussed with the  patient as well.   PREPROCEDURE PHYSICAL:  The patient had stable vital signs.  Neck  supple.  Chest clear to auscultation.  S1, S2 regular.  Abdomen soft  with normal bowel sounds.   DESCRIPTION OF PROCEDURE:  The patient was placed in left lateral  decubitus position and sedated with an additional 1 mg of Versed given  intravenously.  Once the patient was adequately sedated and maintained  on low-flow oxygen and continuous cardiac monitoring the Pentax video  colonoscope was advanced from the rectum to cecum.  A few sigmoid  diverticula were noted.  Three small sessile polyp were biopsied from  the rectum rest exam was unremarkable.  The left colon, transverse  colon, right colon, cecum and terminal ileum appeared healthy.  The  patient tolerated the procedure well  without immediate complications.   IMPRESSION:  1. Three small sessile polyps biopsied in the rectum.  2. A few early sigmoid diverticula.  3. Otherwise normal-appearing transverse colon, right colon, cecum and      terminal ileum.   RECOMMENDATIONS:  1. Await pathology results.  2. Repeat colonoscopy depending on pathology results.  3. Avoid all nonsteroidals including aspirin for the next four weeks.  4. Outpatient follow-up in the next two weeks for further      recommendations.      Anselmo Rod, M.D.  Electronically Signed     JNM/MEDQ  D:  02/03/2007  T:  02/05/2007  Job:  [id number]   cc:   Candyce Churn. Allyne Gee, M.D.  Fax: [phone]

## 2010-11-05 NOTE — Assessment & Plan Note (Signed)
[facility]                             PULMONARY OFFICE NOTE   NAME:[name] [name], [name] [name]                       MRN:          [id number]  DATE:07/04/2008                            DOB:          May 08, 1951    PRIMARY CARE PHYSICIAN:  Candyce Churn. Allyne Gee, MD.   REASON FOR PRESENTATION:  Evaluate the patient with coronary artery  disease.   HISTORY OF PRESENT ILLNESS:  The patient returns for followup of the  above.  Since I last saw her, she has had no new problems other than  fatigue.  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  She has been very tired.  She does not exercise routinely because of  this.  She does her activities of daily living which includes walking at  work.  With this, she does not get any chest pressure, neck or arm  discomfort.  She does not have any palpitation, presyncope, or syncope.  She has no PND or orthopnea.  Unfortunately, she is still smoking  cigarettes.   PAST MEDICAL HISTORY:  1. Coronary artery disease (40% left main stenosed, LAD 50-60%      stenosed, circumflex 30% proximal stenosed, right coronary artery      80% followed by 50% followed by 60% stenosed.  EF 60%.  The patient      had Cypher stent placed to her right coronary artery in March 2005.      She had a followup catheterization in 2007 demonstrating the stent      to be widely patent).  2. Iron-deficiency anemia.  3. Arthritis.  4. Hypertension.  5. Ongoing tobacco use.  6. Carpal tunnel syndrome.  7. Appendectomy.  8. Diabetes.   ALLERGIES:  None.   MEDICATIONS:  1. Calcium 600 mg daily.  2. Multivitamins.  3. Aspirin 325 mg daily.  4. Lopressor 50 mg b.i.d.  5. Iron.  6. Crestor 10 mg daily.  7. Janumet 5/100 b.i.d.  8. Benicar HCT 40/25 daily.  9. Amlodipine 10 mg daily.   REVIEW OF SYSTEMS:  As stated in the HPI and otherwise negative for  other systems.   PHYSICAL EXAMINATION:  GENERAL:  The patient is in no  distress.  VITAL SIGNS:  Blood pressure 140/91, heart rate 70 and regular, and  weight 158 pounds.  HEENT:  Eyes unremarkable.  Pupils equal, round, and reactive to light.  Fundi not visualized.  Oral mucosa unremarkable.  NECK:  No jugular venous distention at 45 degrees.  Carotid upstroke  brisk and symmetric.  No bruits.  No thyromegaly.  LYMPHATICS:  No cervical, axillary, or inguinal adenopathy.  LUNGS:  Clear to auscultation bilaterally.  BACK:  No costovertebral angle tenderness.  CHEST:  Unremarkable.  HEART:  PMI not displaced or sustained.  S1 and S2 within normal limits.  No S3, no S4.  No clicks, no rubs, no murmurs.  ABDOMEN:  Flat, positive bowel sounds, normal in frequency and pitch.  No bruits, no rebound, no guarding.  No midline pulsatile mass.  No  hepatomegaly.  No splenomegaly.  SKIN:  No rashes, no nodules.  EXTREMITIES:  Pulses 2+ throughout.  No edema, no cyanosis, no clubbing.  NEUROLOGIC:  Oriented to person, place, and time.  Cranial nerves II  through XII grossly intact.  Motor grossly intact.   EKG:  Sinus rhythm, rate 57, axis within normal limits, intervals within  normal limits, diffuse T-wave flattening, poor anterior R-wave  progression, no acute ST-T wave changes.   ASSESSMENT AND PLAN:  1. Coronary disease.  The patient is not having any new symptoms.  No      further cardiovascular testing is suggested.  She should continue      with aggressive risk reduction.  2. Tobacco.  We talked about the need to stop smoking.  I offered her      Chantix, but she says she has enough problem with depression and      she does not want to try this.  Hopefully, she can quit (greater      than 3 minutes spent discussing this).  3. Dyslipidemia per Dr. Allyne Gee.  The goal should be an LDL less than      100 and HDL greater than 40.  4. Hypertension.  Blood pressure is at the upper limits of normal.  No      further therapy is warranted though we can keep an eye  on this.      Therapeutic lifestyle changes such as salt restriction, increased      exercise, and a few pounds of weight loss will help.  5. Stress.  The patient was tearful today.  She is under stress      because of her family.  She is also under stress because of her      third shift.  I will send a letter to her employers suggesting a      change of shift.  6. Followup.  I will see her back in 6 months or sooner if needed      given the fact that she is having complaints of fatigue.     Rollene Rotunda, MD, [facility]     JH/MedQ  DD: 07/05/2008  DT: 07/06/2008  Job #: [id number]   cc:   Candyce Churn. Allyne Gee, M.D.

## 2010-11-05 NOTE — Letter (Signed)
July 05, 2008    To Whom It May Concern   RE:  KALEAH, HAGEMEISTER  MRN:  [id number]  /  DOB:  Feb 24, 1951   Dear Sir/Madam:   Ms. Lemus is under my care for treatment of coronary artery disease.  She has other medical problems including hypertension, dyslipidemia,  diabetes.  She is under quite a bit of stress and has chronic fatigue.  All of this I believe is compounded by working the third shift at [employer].  I think her health would improve with change to a different  shift, if you would consider this request.  If you have any further  questions, please do not hesitate to call my office at [phone].    Sincerely,      Rollene Rotunda, MD, [facility]  Electronically Signed    JH/MedQ  DD: 07/05/2008  DT: 07/06/2008  Job #: [id number]

## 2010-11-08 NOTE — Cardiovascular Report (Signed)
NAME:  [name] [name], [name] [name] NO.:  [id number]   MEDICAL RECORD NO.:  [id number]          PATIENT TYPE:  OBV   LOCATION:  1823                         FACILITY:  [facility]   PHYSICIAN:  Salvadore Farber, MD  DATE OF BIRTH:  26-Sep-1950   DATE OF PROCEDURE:  04/20/2006  DATE OF DISCHARGE:                              CARDIAC CATHETERIZATION   PROCEDURES:  1. Left heart catheterization.  2. Coronary angiography.  3. Star Close closure of the right common femoral arteriotomy site.   INDICATIONS:  Ms. Klett is a 60 year old woman with diabetes mellitus and  coronary disease, which she is status post stenting of the ostium of the  right coronary artery.  She presents with somewhat atypical chest pain over  the past 2 days.  It was felt by Dr. Eden Emms to represent unstable angina.  An electrocardiogram demonstrated minor nonspecific ST-T abnormalities.  Initial cardiac enzymes were negative.  He requested cardiac  catheterization.   PROCEDURAL TECHNIQUE:  Informed consent was obtained.  The patient was noted  to be on metformin, having taken a dose this morning.  Creatinine was 1.0.  Since she had preserved renal function, we felt that the risk of contrast  nephropathy was low especially with minimization of contrast use.  We  therefore decided to proceed to the requested angiography with plans for  holding the metformin post procedure and close follow-up of creatinine  subsequently.     Under 1% lidocaine local anesthesia, a 5-French sheath was placed in the  right common femoral artery using the modified Seldinger technique.  Diagnostic angiography was performed using JL-4 and JR-4 catheters.  The JR-  4 catheter was advanced into the left ventricle.  Pressures were measured.  The arteriotomy was then closed using a Star Close device.  Complete  hemostasis was obtained.  The patient was transferred to the holding room in  stable condition.   COMPLICATIONS:  None.   IMPRESSION/PLAN:  The patient has minimal coronary disease with a patent  stent in the right coronary artery.  I suspect a noncardiac etiology to her  chest discomfort.  Will plan on checking renal function renal function on  Wednesday.  Hold metformin until creatinine is known.  Will have her follow  up with Dr. Antoine Poche subsequently.      Salvadore Farber, MD  Electronically Signed     WED/MEDQ  D:  04/20/2006  T:  04/21/2006  Job:  [id number]   cc:   Lorne Skeens. Hoxworth, M.D.  Candyce Churn. Allyne Gee, M.D.

## 2010-11-08 NOTE — H&P (Signed)
NAME:  [name] [name], [name] [name]              ACCOUNT NO.:  [id number]   MEDICAL RECORD NO.:  [id number]          PATIENT TYPE:  EMS   LOCATION:  MAJO                         FACILITY:  [facility]   PHYSICIAN:  Peter C. Eden Emms, MD, FACCDATE OF BIRTH:  31-Jan-1951   DATE OF ADMISSION:  04/20/2006  DATE OF DISCHARGE:                                HISTORY & PHYSICAL   PRIMARY CARDIOLOGIST:  Dr. Antoine Poche.   PRIMARY CARE PHYSICIAN:  Tim Lair.   [name] [name] is a 60 year old African American female with a known history  of coronary artery disease who presents with a presentation of atypical  chest pain, initially started Saturday morning when she woke up.  She states  it felt like her chest was sore and then she noticed that she had a  heaviness, like someone was sitting on her chest midsternally.  No  associated symptoms at that time.  She states she got up, did not take any  nitroglycerin, went ahead and did her usual routine, the discomfort lasted 1  to 2 hours, it gradually eased off.  She rated it on a scale of 1 to 10  about a 4 to 5 at that time.  She states it returned intermittently Sunday.  She slept most of the day.  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  She says around 5:30 this morning, when she got  off work, she noticed more discomfort in the form of soreness in her right  side with some shooting pain through both arms.  She became short of breath  at that time and diaphoretic.  No dizziness.  She came down here to the ER  to get evaluated.  Currently, she is having some sharp pains under left  breast, which she is rating an 8.  No acute EKG changes noted.   ALLERGIES:  NO KNOWN DRUG ALLERGIES.   MEDICATIONS:  Include:  1. Metoprolol 50 mg b.i.d.  2. Aspirin 325.  3. Benicar HCT 40.  4. Crestor 10.  5. Ferrous sulfate 325 t.i.d.  6. Plavix 75.  7. DynaCirc CR 5.  8.  Januvia 100.  9. Glimepiride 4 mg.  10.Os-Cal.  11.Metformin 1000 mg b.i.d.   PAST MEDICAL HISTORY:  Includes:  1. Hypertension.  2. Hypercholesterolemia.  3. Arthritis.  4. Iron deficiency anemia.  5. Type 2 diabetes.  6. A remote history of drug abuse many years ago.  7. Appendectomy.  8. Ongoing tobacco use.  9. Coronary artery disease.   [name] [name] underwent a cardiac catheterization in 2005, at that time, she  was found to have 40% left main stenosis, LAD with 50% to 60% ostial  stenosis, circumflex 30% proximal stenosis and the right coronary had 80%  proximal followed by 60% stenosis.  Patient had a CYPHER drug-eluting stent  placed in the right coronary artery at that time.  Ejection fraction was  calculated at 60%.   SOCIAL HISTORY:  She lives in her [place] with her fiance.  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  Tobacco use ongoing, one pack lasts  her approximately 4 days.  No exercise.  ETOH:  She states she has a  _________p.r.n.Marland Kitchen  Diet:  She tries to follow a diabetic diet.  No drugs or  herbal medications.   FAMILY HISTORY:  Positive for CAD in her mother.  Father deceased at age 70,  secondary to MI.  She has a sister with hypertension.   REVIEW OF SYSTEMS:  Positive for chills and night sweats, chest pain,  shortness of breath, dyspnea on exertion, some peripheral edema and a  chronic cough.  Positive for some arthritis, arthralgia type symptoms in her  shoulders and neck.  All other systems negative per patient.   PHYSICAL EXAMINATION:  VITAL SIGNS:  Temperature 97.4.  Pulse of 59 and  regular.  Respirations 20.  Blood pressure 168/92.  Patient is saturating  100% on 2 liters.  GENERALLY:  She is in no acute distress.  HEENT:  Normocephalic, atraumatic.  Pupils equal, round and reactive to  light.  Sclerae is clear.  She has her own teeth.  NECK:  Supple without  lymphadenopathy.  Negative bruit or JVD.  CARDIOVASCULAR EXAM:  Regular rate  and rhythm.  S1 and S2.  Pulses are 2+  and equal without bruits.  LUNGS:  Clear to auscultation.  Somewhat distant breath sounds bilateral  bases.  SKIN:  Warm and dry.  ABDOMEN:  Soft, nontender.  Positive bowel sounds.  LOWER EXTREMITIES:  Without clubbing or cyanosis.  She has just a trace of  edema in the bilateral ankles.  She does have a superficial circular  swelling, maybe a half dollar size, to her left outer calf that is tender to  touch.  No redness or streaking noted.  NEUROLOGICALLY:  Patient alert and oriented x3.  Cranial nerves II-XII  grossly intact.   Chest x-ray showing mild cardiomegaly.  No CHF.  EKG:  Sinus rhythm without  acute ST or T-wave changes.   LAB WORK:  I STAT showing an H&H of 11.9 and 36.4, WBC 7.7 and 264,000  platelets.  Sodium 140, potassium 3.8, BUN 13, creatinine 1.0 with a glucose  of 78.  Point of care markers:  Troponin less than 0.05 x2.  D-dimer less  than 0.22.   Dr. Maurine Cane in to exam and assess patient with atypical chest  discomfort; however, that has been persistent on and off for 2 days.  Patient still smoking and multiple risk factors for coronary artery disease,  which include  hypertension, diabetes, hypercholesterolemia, family history.  Patient needs  further evaluation.  She has agreed to proceed with cardiac catheterization  today to reevaluate stent.  The risks and benefits have been discussed with  patient and patient agrees to proceed.     ______________________________  Dorian Pod, ACNP    ______________________________  Noralyn Pick. Eden Emms, MD, [facility]    MB/MEDQ  D:  04/20/2006  T:  04/20/2006  Job:  [id number]

## 2010-11-08 NOTE — Cardiovascular Report (Signed)
NAME:  [name] [name], [name] [name] NO.:  [id number]   MEDICAL RECORD NO.:  [id number]          PATIENT TYPE:  INP   LOCATION:  3728                         FACILITY:  [facility]   PHYSICIAN:  Jonelle Sidle, M.D. LHCDATE OF BIRTH:  1951/05/01   DATE OF PROCEDURE:  DATE OF DISCHARGE:                              CARDIAC CATHETERIZATION   CARDIOLOGIST:  Dr. Antoine Poche.   INDICATIONS:  [name] [name] is a 60 year old woman with a history of  hypertension and long term tobacco use as well as hyperglycemia without a  formal diagnosis of type 2 diabetes mellitus as yet.  She presents with  chest discomfort and has ruled out for myocardial infarction.  She had a  Cardiolite back in April of this year that was negative for ischemia.  This  study is requested to clearly assess the coronary anatomy and evaluate for  any potential revascularization strategies.  Of note, the patient also has a  microcytic anemia with a hemoglobin of 9.9 down from 11.   PROCEDURE PERFORMED:  1.  Left heart catheterization.  2.  Selective coronary angiography.  3.  Left ventriculography.   ACCESS AND EQUIPMENT:  The area about the right femoral artery was  anesthetized with 1% lidocaine and a 6 French sheath was placed in the right  femoral artery via the modified Seldinger technique.  Standard preformed 6  [place] and JR4 catheters were used for selective coronary angiography  and an angled pigtail catheter was used for left heart catheterization and  left ventriculography.  All exchanges were made over a wire and the patient  tolerated the procedure well without immediate complications.   HEMODYNAMICS:  Left ventricle 138/28, aorta 144/83, pull back was inaccurate  due to significant ectopy.   ANGIOGRAPHIC FINDINGS:  1.  Left main coronary artery exhibits approximately 30-40% distal tapering      involving the ostium of the left anterior descending and circumflex      coronary arteries.  2.  The  left anterior descending is a medium caliber vessel with two      diagonal branches.  There is 50-60% narrowing noted in the ostium of the      vessel.  There is also calcification in this region.  The degree of      narrowing did not change substantially following intracoronary      nitroglycerin.  3.  The circumflex coronary artery is a medium caliber vessel as well.      There is approximately 30% stenosis in the proximal portion of this      vessel as well as at the ostium.  No significant disease involved in the      obtuse marginal system.  4.  The right coronary artery is a small to medium caliber vessel that is      dominant.  There is an 80% stenosis in the proximal segment of the      vessel which does not change appreciably with intracoronary      nitroglycerin.  Further distally is noted a 50-60% stenosis.   Left ventriculography was performed in the RAO  projection and revealed an  ejection fraction of approximately 60% with no focal wall motion  abnormality.   DIAGNOSES:  1.  Coronary artery disease as described with a 40% distal left main      stenosis, 50-60% ostial left anterior descending stenosis, 30% proximal      circumflex stenosis, and a focal 80% stenosis in the proximal right      coronary artery with 50-60% more distal stenosis.  2.  Left ventricular ejection fraction approximately 60%.  Elevated left      ventricular end diastolic pressure of 28.  The pull back was inaccurate      due to significant ectopy.   RECOMMENDATIONS:  I reviewed the films in detail with Dr. Riley Kill.  Given  the size of the patient's right coronary artery and the fact that she has  been noted to be hyperglycemic and potentially a diabetic, revascularization  would be potentially best served with a drug-alluding stent.  This would  require long term Plavix and given the patient's newly diagnosed microcytic  anemia, she will need further investigation into this possibility prior to   proceeding.  The plan will be to have the patient's microcytic anemia  evaluated first and then tentatively plan on intervention thereafter.  Other  stenoses will be addressed medically and via risk factor modification at  this particular point.  Further plans to follow.       SGM/MEDQ  D:  05/21/2004  T:  05/21/2004  Job:  [id number]

## 2010-11-08 NOTE — Assessment & Plan Note (Signed)
[facility]                            CARDIOLOGY OFFICE NOTE   NAME:[name] [name], [name] [name]                       MRN:          [id number]  DATE:07/27/2006                            DOB:          06-30-1950    PRIMARY CARE PHYSICIAN:  Candyce Churn. Allyne Gee, M.D.   REASON FOR PRESENTATION:  Evaluate patient with coronary disease.   HISTORY OF PRESENT ILLNESS:  The patient was to present today for  routine follow up.  She is now 60 years old.  Coincidentally, she was in  the emergency room yesterday with chest discomfort.  This was actually a  tingling in her hands and her feet that was the predominant symptom.  However, when I read through the ER note, she does report some chest  discomfort that was 8/10.  She is kind of downplaying that now.  She  really points to her lower epigastric area and down into her abdomen.  She said that it would be worse with some palpation or movement.  It was  not similar to previous angina.  There was no associated nausea,  vomiting or diaphoresis.  She is not describing any palpitations,  presyncope or syncope.  She had negative cardiac enzymes and an  unremarkable EKG per the ER notes.  She did get treated with Toradol.  She says her symptoms have since resolved.  She says she has been under  some stress since the death of her sister in the fall.  She is  unfortunately continuing to smoke cigarettes.  She says that typically  she has not been getting any cardiac symptoms.  She has not had any  recently prior to this, or again none since yesterday.  She has had no  activity-induced nausea, vomiting, diaphoresis or chest pain.  She has  had no activity-induced jaw or arm discomfort.  She has had no new  shortness of breath.   PAST MEDICAL HISTORY:  1. Coronary artery disease (40% left main stenosis, LAD 50-60%      stenosis, circumflex 30% proximal stenosis, right coronary artery      80% proximal stenosis, followed by 50%,  followed by 60%, EF of 60%.      The patient had a Cypher stent placed to her right coronary artery      in March of 2005.  She had a follow up catheterization in 2007      demonstrating the stent to be wide open).  2. Iron-deficiency anemia.  3. Arthritis.  4. Hypertension.  5. Tobacco use.  6. Carpal tunnel syndrome.  7. Appendectomy.   ALLERGIES:  None.   MEDICATIONS:  1. Calcium.  2. Multivitamin.  3. Plavix 75 mg daily.  4. Aspirin 325 mg daily.  5. Lopressor 50 mg b.i.d.  6. Iron  7. Crestor 10 mg daily.  8. DynaCirc 5 mg daily.  9. Benicar 4 mg daily.  10.Janumet 50/1000 b.i.d.   REVIEW OF SYSTEMS:  As stated in the history of present illness and  otherwise negative for other systems.   PHYSICAL EXAMINATION:  GENERAL:  The patient is in no  distress.  VITAL SIGNS:  Blood pressure 139/88, heart rate 60 and regular.  HEENT:  Eyes unremarkable.  Pupils equal, round and reactive to light.  Fundi not visualized.  Oral mucosa unremarkable.  NECK:  No jugular venous distention at 45 degrees.  Carotid upstrokes  brisk and symmetric.  No bruits, no thyromegaly.  LYMPHATICS:  No cervical, axillary or inguinal adenopathy.  LUNGS:  Clear to auscultation bilaterally.  BACK:  No costovertebral angle tenderness.  CHEST:  Unremarkable.  HEART:  PMI not displaced or sustained.  S1 and S2 within normal limits.  No S3, no S4, no clicks, no rubs, no murmurs.  ABDOMEN:  Flat.  There is a palpable firm nodule right above the  umbilicus.  It seems to be about 2 x 3 cm and somewhat irregular.  It  seems to protrude a little bit with sit up, but it is not an umbilical  or ventral hernia.  It seems to be more subcutaneous and not consistent  with a lipoma.  No hepatomegaly, splenomegaly.  No midline pulse, mass  or bruit.  SKIN:  No rashes, no nodules.  EXTREMITIES:  There were 2+ pulses throughout.  No edema, no cyanosis,  no clubbing.  NEUROLOGIC:  Oriented to person, place, and time.   Cranial nerves  grossly intact.  Motor grossly intact.   ELECTROCARDIOGRAM:  EKG revealed sinus rhythm, rate 60.  Axis within  normal limits.  Intervals within normal limits.  No acute ST wave  changes.   ASSESSMENT AND PLAN:  1. The patient was evaluated in the ER yesterday with chest discomfort      and not felt to have angina.  She had a catheterization in December      of 2007 with no obstructive coronary disease.  At this point, she      is not having any symptoms consistent with angina.  No further      cardiovascular testing is suggested.  She will continue with      aggressive risk reduction.  She does have close follow up of her      diabetes and lipids and hypertension by Dr. Allyne Gee.  2. Abdominal mass.  The patient reports that this has been coming and      going and that it is bothersome.  I am not sure what it is.  I      spoke with Dr. Allyne Gee about this.  It had not been a complaint the      patient had described to her previously.  She has not had any work-      up of it.  Dr. Allyne Gee and I decided on starting with a      noncontrasted CT of the abdomen to look at the abdominal wall and      to evaluate this mass.  3. Followup - I will see the patient back in 1 year or sooner if she      is having any problems.  She will continue the follow closely with      Dr. Allyne Gee.     Rollene Rotunda, MD, [facility]  Electronically Signed    JH/MedQ  DD: 07/27/2006  DT: 07/27/2006  Job #: [id number]   cc:   Candyce Churn. Allyne Gee, M.D.

## 2010-11-08 NOTE — Cardiovascular Report (Signed)
NAME:  [name] [name], [name] [name] NO.:  [id number]   MEDICAL RECORD NO.:  [id number]          PATIENT TYPE:  INP   LOCATION:  6527                         FACILITY:  [facility]   PHYSICIAN:  Salvadore Farber, M.D. LHCDATE OF BIRTH:  February 28, 1951   DATE OF PROCEDURE:  05/24/2004  DATE OF DISCHARGE:                              CARDIAC CATHETERIZATION   PROCEDURE:  Drug-eluting stent placement in the proximal right coronary  artery.   INDICATION:  Ms. Millican is a 60 year old lady with hypertension, type 2  diabetes mellitus, and longstanding tobacco abuse who presented with  unstable angina.  She underwent diagnostic study by Dr. Diona Browner on November  29.  This demonstrated a moderate stenosis in the ostium of the LAD and a  severe stenosis of the proximal LAD.  Percutaneous revascularization was  recommended.  However, she was noted to have microcytic anemia.  This was  evaluated by GI who felt that she was not actively bleeding and was stable  for percutaneous intervention including long term Plavix use.  She is  therefore brought to the lab today for planned intervention on the RCA.   PROCEDURAL TECHNIQUE:  Informed consent was obtained.  Under 1% lidocaine  local anesthesia, a 6 French sheath was placed in the right common femoral  artery using the modified Seldinger technique.  Anticoagulation was  initiated with bivalirudin.  ACT was confirmed to be greater than 225  seconds.  A short-tip JR-4 guide was advanced over wire and engaged in the  ostium of the RCA.  Prowater wire was advanced beyond the lesion without  difficulty.  Lesion was directly stented using a 2.5 x 13-mm Cypher deployed  at 14 atmospheres.  The stent was post dilated using a 2.5 x 13-mm Quantum  at 16 atmospheres.  Final angiography demonstrated no residual stenosis and  TIMI-3 flow to the distal vasculature.  The patient tolerated the procedure  well and was transferred to the holding room in stable  condition.   COMPLICATIONS:  None.   IMPRESSION/RECOMMENDATIONS:  Successful drug-eluting stent placement in the  proximal right coronary artery.  The patient will be continued on Plavix for  six months.  Aspirin will be continued indefinitely.      Will   WED/MEDQ  D:  05/24/2004  T:  05/24/2004  Job:  [id number]   cc:   Rollene Rotunda, M.D.

## 2010-11-08 NOTE — Consult Note (Signed)
NAME:  [name] [name], [name] [name]              ACCOUNT NO.:  [id number]   MEDICAL RECORD NO.:  [id number]          PATIENT TYPE:  INP   LOCATION:  3728                         FACILITY:  [facility]   PHYSICIAN:  Lajuana Matte, MD  DATE OF BIRTH:  06/27/1950   DATE OF CONSULTATION:  05/21/2004  DATE OF DISCHARGE:                                   CONSULTATION   REASON FOR CONSULTATION:  Microcytic anemia.   HISTORY OF PRESENT ILLNESS:  [name] [name] is a pleasant 60 year old female  we were asked to see in consultation for evaluation of microcytic anemia.  She was admitted with chest pain on May 19, 2004, atypical, with T wave  depression, inferolaterally.  She was admitted to telemetry, placed on  heparin, and underwent catheterization on May 20, 2004, and now is  awaiting a stent placement today, on May 21, 2004.  She was noticed to  have a decreasing hemoglobin and hematocrit on admission, with MCV of 74,  suggestive of microcytic anemia.  On questioning, the patient mentioned that  she was diagnosed with iron-deficiency anemia, at least 2 years ago, back in  [place].  Initially, she was taking iron pills, but she is off iron for at  least 1 year, failing to follow up.  She denies any GI bleed history.  Hemoglobin is pending.  Iron studies are currently pending.   PAST MEDICAL HISTORY:  1.  Iron-deficiency anemia.  2.  Arthritis.  3.  Hypertension.  4.  History of tobacco abuse.  5.  Remote history of illicit drug use; none now.  6.  Medicine noncompliance.  7.  Carpal tunnel syndrome.   SURGERIES/PROCEDURES:  Status post appendectomy in 2003.   ALLERGIES:  No known drug allergies.   CURRENT MEDICATIONS:  1.  Aspirin 81 mg daily.  2.  Lovenox 80 mg subcutaneous prophylactically.  3.  Lopressor subcutaneous b.i.d.  4.  Protonix 40 mg daily.  5.  Maalox.  6.  Morphine.  7.  Nitroglycerin.  8.  Phenergan.  9.  Senokot.  10. Ambien.  11. Tylenol p.r.n.   REVIEW OF  SYSTEMS:  See HPI for significant positives.  The patient is  currently fatigued.  Denies any weight loss or decrease in appetite.  No  night sweats.  No myalgias.  There is a weight gain of about 10 pounds since  September of 2005, but no abdominal pain or vomiting.  There is mild nausea.  No constipation at this time.  No dysphagia or dysuria or gross hematuria.  No calf tenderness.  She does complain of tingling in the tips of her  digits.  She also complains of bilateral peripheral edema at the ankles.   FAMILY HISTORY:  Mother died with heart disease in her 55s.  Father died  with an MI in his 66s.  She has 5 sisters in good health.  She had 2  brothers, but 1 died with heart disease.  No history of bleeding disorders  in the family.   GYNECOLOGIC HISTORY:  Her last menstrual period was in September of 2005.   SOCIAL HISTORY:  The patient is single.  She had 2 grown children, but 1  died in a motor vehicle accident.  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  No alcohol history.  She smokes one pack of day of tobacco for  the last 25 years.  Her main caretaker is her sister, Wilnette Kales (telephone  number is [phone]).  The patient moved from [place] in September of this  year.  She lives in [place] now  (telephone number [phone]).   HEALTH MAINTENANCE:  Her last mammogram was on February 07, 2004, negative.  Colonoscopy and EGD have never been performed, per patient report.   PHYSICAL EXAMINATION:  GENERAL:  This is a mildly obese 60 year old black  female in no acute distress, alert and oriented x3.  VITAL SIGNS:  Blood pressure elevated at 160/101, pulse 64, respirations 16,  temperature 98, weight 181, height 71 inches, pulse oximetry 99% on room  air.  HEENT:  Normocephalic and atraumatic.  Throat - oral mucosa clear with no  thrush.  NECK:  Supple.  No JVD.  No cervical or supraclavicular masses.  LUNGS:  Clear to auscultation bilaterally.  BREASTS:    Without  masses.  CARDIOVASCULAR:  Regular rate and rhythm without murmurs, rubs, or gallops.  ABDOMEN:  Distended, but nontender.  Bowel sounds x4.  No palpable spleen or  liver.  GU/RECTAL:  Deferred.  EXTREMITIES:  No clubbing or cyanosis.  No edema.  SKIN:  Without lesions.  NEUROLOGIC:  Nonfocal.   LABORATORIES:  Hemoglobin of 10.5, hematocrit 32.5.  On admission, it was 11  and 33.8 respectively.  White count 5.4, platelets 156, neutrophils 2.7, MCV  74.7.  PT 12.2, PTT 33, INR 0.9.  Reticulocyte count pending.  B-12 pending.  Folic acid pending.  TSH 0.556.  Sodium 142, potassium 4.0, BUN 9,  creatinine 0.9, glucose 83.  There is no liver functions available at this  time.   ASSESSMENT AND PLAN:  Dr. Arbutus Ped has seen and evaluated the patient, and  the chart has been reviewed.  This is a 60 year old African-American female  admitted with recent cardiac complaints, consistent with coronary artery  disease, with focal 80% stenosis in the proximal right coronary artery, and  60% distal stenosis.  During her hospitalization, she was found to have  microcytic anemia.  The patient gives a history of iron deficiency,  according to the notes, since she was in 9th grade.  She was on iron  supplement, but discontinued it a year ago because of constipation.  She  denies any recent blood loss, and her menstrual cycles are irregular now,  since she is perimenopausal.  The last menstrual cycle was in September of  2005, and there is no personal family history of blood diseases, especially  sickle cell or thalassemia.  Iron studies are still pending.  The plan is to  check the pending iron studies, and check hemoglobin electrophoresis, to  rule out sickle cell and thalassemia.  Consider GI evaluation for  malabsorption syndrome.  If the studies confirm iron deficiency, the patient  may benefit from IV iron.  Will  continue to follow up with you, and will schedule a follow up appointment in 2-3 weeks  after discharge for further evaluation.   Thank you very much for allowing [place] the opportunity to participate in the  care of [name] [name].      Huntley Dec   SW/MEDQ  D:  05/22/2004  T:  05/22/2004  Job:  [id number]

## 2010-11-08 NOTE — Discharge Summary (Signed)
NAME:  [name] [name], [name] [name] NO.:  [id number]   MEDICAL RECORD NO.:  [id number]          PATIENT TYPE:  INP   LOCATION:  4737                         FACILITY:  [facility]   PHYSICIAN:  Isidor Holts, M.D.  DATE OF BIRTH:  1950/10/01   DATE OF ADMISSION:  08/27/2004  DATE OF DISCHARGE:  08/29/2004                                 DISCHARGE SUMMARY   PMD:  [name], M.D.   CARDIOLOGIST:  Rollene Rotunda, M.D.   DISCHARGE DIAGNOSES:  1.  Atypical chest pain. Negative stress Cardiolite.  2.  Cervical spondylosis with spinal stenosis and radiculopathy.  3.  History of chronic anemia.   DISCHARGE MEDICATIONS:  Pre-admission medications.   PROCEDURES:  1.  Chest x-ray dated August 27, 2004.  This showed cardiomegaly, diffuse      interstitial coarsening, likely chronic, although a component of      interstitial edema is not completely excluded.  2.  Stress Cardiolite dated August 27, 2004.  Myocardial imaging with SPECT      (resting exercise).  Findings: No reversible or irreversible defect to      suggest ischemia or infarct.  Gated left ventricular wall motion study      is normal.  Left ventricular ejection fraction is 66%.  3.  MRI cervical spine dated August 29, 2004.  This showed cervical      spondylitic changes with spinal stenosis most notable on the right at      the C3-4 level and on the left at the C5-6 level.  Uncovertebral joint      degenerative changes, moderate to slightly marked bilateral foraminal      narrowing at C3-4, C4-5, C5-6 and C6-7 levels.  Visualized intracranial      structures reveal mild atrophy as well as soft tissue prominence      posterior superior nasopharyngeal region.   CONSULTATIONS:  Arvilla Meres, M.D., Cardiology.   ADMISSION HISTORY:  As in H&P of August 27, 2004.  However, in brief, this is  a 60 year old African-American female, with known history of coronary artery  disease, status post cardiac catheterization November,  2005, at which time  she was found to have multiple vessel disease and is status post drug  eluting stent in the right coronary artery.  She presents on this occasion  with central chest pain radiating to both her arms, associated with numbness  and tingling in both arms.  She was admitted for further evaluation,  investigation and management.   CLINICAL COURSE:  1.  Chest pain.  This was felt to have both typical and atypical features.      However, given the patient's background history of coronary artery      disease, hypertension and dyslipidemia it was felt that her symptoms      were sufficiently concerning to merit further evaluation.  EKG showed no      acute ischemic changes, cardiac enzymes remained unelevated.  A      cardiology consultation was requested, this was kindly provided by Dr.      Arvilla Meres who felt that the patient's presenting  symptoms      included both typical and atypical features.  He, therefore, suggested a      stress Cardiolite which was carried out and which was negative for      ischemia or infarct.  Keeping in mind the atypical features of patient's      symptoms, i.e. tingling and numbness bilaterally both upper extremities,      it was felt that further investigations were to be carried out to rule      out the possibility of cervical spondylitic myelopathy/radiculopathy.      See #2 below.   1.  Cervical spondylosis with spinal stenosis and radiculopathy.  See      comments above in #1.  The patient underwent a cervical spine MRI on      August 29, 2004, which confirmed degenerative joint disease affecting the      cervical spine with spinal stenosis, most notable on the right in the C3-      4 level and on the left in the C5-6 level.  There were also bilateral      moderate to marked neuro foraminal stenosis at C3-4, C4-5, C5-6 and C6      to C7 levels.  These changes were deemed responsible for patient's arm      symptoms.  She has been  recommended use of a soft cervical collar and      requested to followup with her P.M.D., Dr. Allyne Gee, who we expect will      arrange neurosurgical consultation/opinion.   1.  Chronic anemia.  The patient has a history of chronic iron deficiency      anemia against a background of diverticulosis.  She is currently under      the care of Dr. Si Gaul, hematologist, who is treating her with      intravenous iron supplements.  This did not prove to be an issue during      the course of her hospital stay and, as a matter of fact, at the time of      discharge on August 29, 2004, her hemoglobin was stable at 11.0.   1.  Possible impaired glucose tolerance.  The patient has a background      history of questionable possible impaired glucose tolerance. Her most      recent hemoglobin A1c was 6.5.  She is not on any drug treatment or even      on dietary management.  Suffice it to say, that during the course of her      hospital stay regular Accu-Cheks revealed euglycemia.  Diabetes      mellitus, therefore, does not appear to be a concern at this time.      However, I suspect her primary care physician will continue to keep an      eye on her from this point of view.  This has been communicated to      patient.   DISPOSITION:  Patient was discharged in satisfactory condition on August 29, 2004.   PAIN MANAGEMENT:  Not applicable.   ACTIVITY:  As tolerated.   DIET:  Healthy heart diet.   WOUND CARE:  Not applicable.   SPECIAL INSTRUCTIONS:  Patient has been prescribed a soft cervical collar  which she is to utilize, until re-evaluated by her primary care physician.  She has been instructed to followup with her primary care physician, Dr.  [name], in 1-2 weeks.  She is to call for  an appointment and we  expect her primary care physician will arrange a neurosurgical consultation. The patient has been recommended to return to regular duties on September 09, 2004.      CO/MEDQ   D:  09/02/2004  T:  09/02/2004  Job:  [id number]   cc:   Candyce Churn. Allyne Gee, M.D.  [address]  [address]  [place]  [place] [address]  Fax: [phone]   Rollene Rotunda, M.D.   Arvilla Meres, M.D. [facility]

## 2010-11-08 NOTE — Discharge Summary (Signed)
NAME:  [name] [name], [name] [name] NO.:  [id number]   MEDICAL RECORD NO.:  [id number]          PATIENT TYPE:  OBV   LOCATION:  2853                         FACILITY:  [facility]   PHYSICIAN:  Salvadore Farber, MD  DATE OF BIRTH:  [birth year]   DATE OF ADMISSION:  04/20/2006  DATE OF DISCHARGE:  04/20/2006                                 DISCHARGE SUMMARY   DISCHARGING PHYSICIAN:  Dr. Randa Evens.   PRIMARY CARDIOLOGIST:  Dr. Carolynn Comment.   PRIMARY CARE DOCTOR:  Dr. [name].   DISCHARGE DIAGNOSIS:  Atypical chest pain status post cardiac  catheterization this admission showing patent stent to the right coronary  artery and otherwise non-obstructive CAD (minimal disease).   PAST MEDICAL HISTORY:  Includes hypertension, hypercholesteremia, arthritis,  iron deficiency anemia, type 2 II diabetes, appendectomy, coronary artery  disease status post cardiac catheterization December 2005, placement of a  drug-eluting stent to the proximal RCA.  Ejection fraction 60% by cath at  that time, a remote history of drug use.   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  She  complained of some shortness and heaviness in her chest that had been  intermittent since Saturday morning.  Patient initially evaluated by Dr.  Eden Emms, who felt the patient needed further evaluation of her coronary  arteries.  The patient was admitted to telemetry, proceeded with cardiac  catheterization on the same day.  The patient tolerated catheterization  without any complications, currently in a short stay post cardiac  catheterization, bed rest.  The patient receiving IV fluids x6 hours.  If  cath site is stable, the patient will be discharged home this evening with  instructions to follow up with Dr. Antoine Poche November 7th at 10:45,  schedule  her for a BMET on October 31 at 10:00 a.m.  She has been given the post-  cardiac catheterization discharge instructions with a prescription for  Protonix 40 mg daily, which is new.   Otherwise, she is to continue her previous medications to include metoprolol  15 mg b.i.d., aspirin 325, Benicar/HCT 40, Crestor 10, ferrous sulfate 325  t.i.d. or as previously instructed, Plavix 75, DynaCirc CR 5, Januvia 100,  glimepiride 4 mg, metformin 1000 mg b.i.d.  The patient is instructed to  resume on October 31, Os-Cal as previously taken.  She is to follow at our  office for any problems from her cath site.   Duration of discharge encounter 25 minutes.      Dorian Pod, ACNP      Salvadore Farber, MD  Electronically Signed    MB/MEDQ  D:  04/20/2006  T:  04/21/2006  Job:  [id number]

## 2010-11-08 NOTE — H&P (Signed)
NAME:  Nedd, Zanna              ACCOUNT NO.:  [id number]   MEDICAL RECORD NO.:  [id number]          PATIENT TYPE:  INP   LOCATION:  1823                         FACILITY:  [facility]   PHYSICIAN:  Jonna L. Robb Matar, M.D.DATE OF BIRTH:  1951/03/09   DATE OF ADMISSION:  08/27/2004  DATE OF DISCHARGE:                                HISTORY & PHYSICAL   PRIMARY CARE PHYSICIAN:  Dr. [name]   CARDIOLOGY:  Dr. Antoine Poche   CHIEF COMPLAINT:  Chest pain.   HISTORY:  This is a 60 year old African-American female who developed chest  pain at around 12:30 p.m.  Was in the middle of her chest.  Radiated to both  arms.  She was treated in the emergency room with morphine and  nitroglycerin.  It got better until about 4:30 in the morning when she again  has a dull aching in her chest, numbness and tingling in both arms.  She was  admitted in November of 2005.  At that time was found to have multiple  vessel disease and had a drug-eluting stent put in the right coronary  artery.   ALLERGIES:  None.   MEDICATIONS:  1.  Benicar 20.  2.  Crestor 5.  3.  Plavix 75.  4.  Aspirin 325.  5.  Lopressor 50 b.i.d.  6.  Vasotec 10 b.i.d.  7.  DynaCirc 10 daily.   PAST MEDICAL HISTORY:  1.  Hypercholesterolemia.  2.  Hypertension.  3.  Arthritis.  4.  Iron deficiency anemia felt secondary to menorrhagia.  In November she      had had an EGD and colonoscopy and was just found to have a little bit      of diverticulosis and no GI source of the anemia.  5.  Possible diabetes mellitus.  She has had episodes of hyperglycemia.  Her      A1C last time was 6.5.  Does not seem to be on any oral hypoglycemics.   FAMILY HISTORY:  Mostly heart disease.   SOCIAL HISTORY:  She is an ex-smoker, nondrinker.  No drugs.  Works in the  [employer], third shift.   REVIEW OF SYSTEMS:  Patient denies visual or ear, nose, and throat problems.  No lung disease.  No coughing or wheezing.  No nausea,  vomiting,  hematemesis, or melena.  No kidney problems.  No breast masses or  tenderness.  No thyroid disease.  No skin problems.  She does have mild  osteoarthritis, especially in her back, knees, and feet.  No psychiatric  problems.   PHYSICAL EXAMINATION:  VITAL SIGNS:  Temperature 96.9, pulse 62,  respirations 20, blood pressure 143/92, 98% on room air.  GENERAL:  Relatively overweight African-American female.  HEENT:  Conjunctivae, lids are normal.  Pupils are slightly constricted.  EOMs are full.  She has normal hearing and mucosa.  NECK:  She has no masses, thyromegaly, or carotid bruits.  LUNGS:  Respiratory effort is normal.  Lungs are clear to A&P without  wheezing, rales, rhonchi, or dullness.  BREASTS:  Without masses or tenderness.  HEART:  Regular rate and rhythm.  Normal S1, S2 without murmurs, rubs, or  gallops.  There is no cyanosis, clubbing, or edema.  ABDOMEN:  Nontender.  Normal bowel sounds.  No hepatosplenomegaly.  GENITOURINARY:  External genitalia is normal.  LYMPH:  There is no inguinal adenopathy or cervical adenopathy.  EXTREMITIES:  Muscle strength is 5/5 all four extremities.  SKIN:  No rash, lesion, or nodule.  NEUROLOGIC:  Cranial nerves are intact.  DTRs are 2+ and equal.  Sensation:  Although she says that her hands are numb and tingling, she does have good  sensation to touch.  She is alert and oriented x3.  Normal in memory,  judgment, and affect.   LABORATORY DATA:  EKG shows flattened T-waves and a U-wave.  EKG suggests an  old septal MI.  Cardiac enzymes are negative.  Potassium is 3.3.   IMPRESSION:  1.  Chest pain.  Will add beta blocker and aspirin.  Consult [facility].  Later this morning patient probably needs to have a repeat      catheterization.  2.  Hypokalemia.  I will replace this orally.  3.  Hypertension.  4.  Hyperlipidemia.  5.  Osteoarthritis.      JLB/MEDQ  D:  08/27/2004  T:  08/27/2004  Job:  [id number]    cc:   Candyce Churn. Allyne Gee, M.D.  [address]  [address]  [place]  [place] [address]  Fax: [phone]

## 2010-11-08 NOTE — Consult Note (Signed)
NAME:  [name] [name], PASCHEN NO.:  [id number]   MEDICAL RECORD NO.:  [id number]          PATIENT TYPE:  INP   LOCATION:  1823                         FACILITY:  [facility]   PHYSICIAN:  Arvilla Meres, M.D. LHCDATE OF BIRTH:  Jul 12, 1950   DATE OF CONSULTATION:  08/27/2004  DATE OF DISCHARGE:                                   CONSULTATION   PRIMARY CARDIOLOGIST:  Rollene Rotunda, M.D.   PRIMARY CARE PHYSICIAN:  Robyn N. Allyne Gee, M.D.   CHIEF COMPLAINT:  Hand numbness, bilateral, with tingling, radiating up  right arm and to chest.   HISTORY OF PRESENT ILLNESS:  The patient states her initial discomfort  started around 12:30 yesterday afternoon.  She woke up with numbness and  tingling in the bilateral hands and fingers.  Please note, she works third  shift.  The patient states she took an aspirin.  Her hand continued to be  numb.  Around 6 p.m. last night, she had some shortness of breath, so she  took a nitroglycerin for the hand numbness and she felt a little bit better.  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  She complained of  midsternal tightness with pressure, rating the pain around 8.  She states  the pain radiated across her chest with soreness.  The patient states she  walked down to the emergency department around 1 a.m.  She complained of  shortness of breath, negative for dizziness, positive for diaphoresis,  negative for nausea or vomiting.  She states the pain was relieved only  after morphine was given.  She fell asleep.  She woke up again this morning  in the emergency room with tingling in bilateral hands.   ALLERGIES:  No known drug allergies.   MEDICATIONS:  1.  Benicar 20.  2.  Crestor 5.  3.  Plavix 75 which patient states she continues until May of this year.  4.  Aspirin 325.  5.   Lopressor 50 b.i.d.  6.  Vasotec 10 b.i.d.  7.  DynaCirc 10 daily.   PAST MEDICAL HISTORY:  The patient states since her catheterization in  November 2005, she has increased fatigue with work schedule; however, she is  currently working six days a week.  She denies any further chest pain since  she had her stent in November.  She had a drug-eluting CYPHER stent to the  proximal right coronary artery in November 2005.  Her catheterization at  that time also showed left main coronary exhibiting approximately 30 to 40%  distal tapering involving the ostium of the LAD and circumflex.  The LAD had  a 50 to 60% narrowing noted in the ostium of the vessel.  The circumflex  coronary artery had a 30% stenosis in the proximal portion.  The patient was  seen in followup on June 07, 2004, by Dr. Antoine Poche, doing well from a  cardiac standpoint.  Other medical history includes  anemia, iron deficiency  secondary to menorrhagia.  She is status post endoscopy and colonoscopy  showing diverticulosis with outer hemorrhoids secondary to positive  Hemoccult stools.  Arthritis, hypertension, tobacco abuse in the past,  carpal tunnel syndrome, appendectomy, dyslipidemia, tobacco, type 2 diabetes  - diet controlled, remote history of substance abuse many years ago, EF of  60% by catheterization in November.   SOCIAL HISTORY:  She lives in [place] alone.  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  She has one adult daughter alive  and well.  Exercise limited to working six days a week here, she  states.  She denies any ETOH, or herbal medication use.  She is very compliant, she  states, with a decreased starch, decreased fried, and decreased sugar diet.   FAMILY HISTORY:  Mother deceased at age 25 from coronary artery disease,  father deceased at age 59 from an MI.  She has one adult brother who is  deceased from an MI.   REVIEW OF SYSTEMS:  Positive for chills and hot  flashes.  CARDIOPULMONARY:  Positive for chest pain, shortness of breath at times, and edema resolved at  rest in the lower extremities.  GU:  Positive for menopausal symptoms.  MUSCULOSKELETAL:  Positive for arthritic pain in joints, hands.   PHYSICAL EXAMINATION:  VITAL SIGNS:  Temperature 98.3, pulse 88,  respirations 19, blood pressure 132/88.  She is saturating 99% on 2 L.  GENERAL:  She is alert and oriented, very pleasant and cooperative, in no  acute distress.  HEENT:  Pupils are equal, round, and reactive to light.  NECK:  Supple without lymphadenopathy.  Negative bruit, negative JVD.  CARDIOVASCULAR:  Heart rate regular rate and rhythm.  S1, S2.  LUNGS:  Clear to auscultation bilaterally.  ABDOMEN:  Soft, nontender.  Positive bowel sounds .  EXTREMITIES:  No clubbing, cyanosis or edema.  NEUROLOGIC:  She is alert and oriented x3.  Cranial nerves II-XII are  grossly intact.   LABORATORY DATA:  Chest x-ray - positive cardiomegaly, diffuse interstitial  coarsening, likely chronic component of interstitial edema not excluded, EKG  showing a rate of 60, sinus rhythm, TR 162, QRS 70, QTC 328.  Lab work  showing a white blood cell count of 5.1, hemoglobin 10.9, hematocrit 33.6,  platelets 246.  Sodium 139, potassium 6.3, BUN 15, creatinine 0.8, glucose  139.  Cardiac enzymes:  Troponin point of care negative x 3.  D-dimer less  than 0.22.  Dr. Billy Fischer in to see patient, assessed, examined.   PROBLEM LIST:  A 60 year old female with recent right coronary artery stent  now with recurrent chest pain and arm pain with both typical and atypical  features.  Point of care enzymes negative x 3.  EKG without significant  change.  Doubt this is stent closure but would proceed with gaited stress  test Cardiolite to rule out ischemia, possible cervical disk disease with  bilateral hand pain.   PLAN:  Admit patient to telemetry unit.  We can discontinue the nitroglycerin drip, change to  nitroglycerin paste 1 inch q.6h.  Use Lovenox  in place of heparin prophylaxis, and I will schedule patient for a gaited  stress test in the a.m.      MB/MEDQ  D:  08/27/2004  T:  08/27/2004  Job:  [id number]

## 2010-11-08 NOTE — H&P (Signed)
NAME:  [name], [name]                          ACCOUNT NO.:  [id number]   MEDICAL RECORD NO.:  [id number]                   PATIENT TYPE:  INP   LOCATION:  1830                                 FACILITY:  [facility]   PHYSICIAN:  Corinna L. Lendell Caprice, MD             DATE OF BIRTH:  22-May-1951   DATE OF ADMISSION:  10/11/2003  DATE OF DISCHARGE:                                HISTORY & PHYSICAL   CHIEF COMPLAINT:  Chest pain.   HISTORY OF PRESENT ILLNESS:  [name] [name] is a 60 year old black female with  a history of hypertension and no primary care physician here in town, who  presents with a several-hour history of chest pain.  She describes it as  squeezing, pressure-like, heavy, and band-like.  It is worse on the left  chest and left back.  She also was slightly short of breath.  She had slight  diaphoresis, but she felt it might be due to her hot flashes.  It occurred  at 7 a.m. 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  She came to  the emergency room and apparently got two nitroglycerin sublingual, some  aspirin, and a GI cocktail.  Her chest pain at its worse was 9/10 and  currently is 5/10.  She reports that she had a negative stress test two to  three years ago in [place].  She reports that she has been under a lot of  emotional stress recently and thinks it may be related.  She has no nausea,  no reflux pain, no syncope, and no palpitations.  Her risk factors are blood  pressure for which she is not taking any medicines currently since she moved  from [place] and tobacco abuse.   PAST MEDICAL HISTORY:  1. Hypertension not treated since September when she moved from [place].  2. Tobacco abuse.   MEDICATIONS:  She had been taking an aspirin a day, but stopped a few weeks  ago.  She also takes calcium, vitamin E, and multivitamins.   SOCIAL HISTORY:  She smokes half of a packs of cigarettes per day.  She  moved from [place] to assist an ailing friend.  She use to use  drugs, but  does not currently.  She drinks alcohol occasionally.   FAMILY HISTORY:  Her mother died of heart disease.  She cannot recall her  age.  Her father also died of a heart attack in his 23s.  Her siblings all  have hypertension.   REVIEW OF SYSTEMS:  As above, otherwise negative.   PHYSICAL EXAMINATION:  VITAL SIGNS:  She is afebrile.  Her blood pressure  initially was 184/105.  Currently it is 169/113.  Pulse is 68, respiratory  rate 28,  and oxygen saturation 100% on room air.  GENERAL APPEARANCE:  The patient is well-nourished, well-developed, and in  no acute distress.  Asleep and easily arousable.  HEENT:  Normocephalic and  atraumatic.  Pupils equal, round, and reactive to  light.  Sclerae nonicteric.  Moist mucous membranes.  NECK:  Supple.  No carotid bruits.  LUNGS:  Clear to auscultation bilaterally without wheezing, rhonchi, or  rales.  CARDIOVASCULAR:  Regular rate and rhythm without murmurs, rubs, or gallops.  She has no chest wall tenderness.  ABDOMEN:  Soft, nontender, and nondistended.  GENITOURINARY:  Deferred.  EXTREMITIES:  No cyanosis, clubbing, or edema.  SKIN:  No rash.  PSYCHIATRIC:  Normal affect.  NEUROLOGIC:  Alert and oriented.  Cranial nerves, sensory, and motor exam  are intact.   LABORATORIES:  Three sets of point of care enzymes are normal.  EKG shows  normal sinus rhythm with nonspecific changes, namely flipped T waves  laterally in leads 1 and 2.  Chest x-ray is reportedly normal per ER  physician.   ASSESSMENT AND PLAN:  1. Chest pain.  The patient has received aspirin.  She has also received IV     beta blocker.  I will continue the day aspirin and oxygen.  Will add     nitroglycerin paste.  Rule out MI by serial cardiac enzymes and EKGs.     She will be admitted to telemetry.  I have also spoken with Irving Burton of     [facility], who will oversee the stress test.  I  have not     officially consulted them, but if positive, they  will need to be     consulted.  2. Uncontrolled hypertension.  I will give nitroglycerin paste.  As she will     need a stress test tomorrow, I will avoid further beta blockers and     instead given clonidine.  3. Tobacco abuse.  Counseled against.                                                Corinna L. Lendell Caprice, MD    CLS/MEDQ  D:  10/11/2003  T:  10/12/2003  Job:  [id number]

## 2010-11-08 NOTE — Assessment & Plan Note (Signed)
[facility]                              CARDIOLOGY OFFICE NOTE   NAME:[name] [name], [name] [name]                       MRN:          [id number]  DATE:05/04/2006                            DOB:          Dec 20, 1950    CARDIOLOGIST:  Rollene Rotunda, MD, [facility]   PRIMARY CARE PHYSICIAN:  Candyce Churn. Allyne Gee, M.D.   HISTORY OF PRESENT ILLNESS:  [name] [name] is a 60 year old female patient  followed by Dr. Antoine Poche with history of coronary disease status post Cypher  drug eluting stent placement to the RCA in December 2005 who was recently  admitted with chest pain and shortness of breath.  She ruled out for  myocardial infarction by enzymes.  She was taken for cardiac catheterization  the same day.  This revealed a patent stent in the RCA and minimal stenosis  in the diagonal branch of the LAD.  Her chest pain was felt to be  noncardiac.  She was discharged to home.  She was given a prescription for  Protonix, but she never filled this.  She is in the office today for follow  up post hospitalization.  She denies any recurrent chest pain.  She just got  off work and has not taken any of her blood pressure medications yet.  Blood  pressure is somewhat elevated now.  She denies any shortness of breath,  syncope or presyncope.  She does notice some epigastric tenderness from time  to time.  She did have an endoscopy in 2005 that showed gastroduodenitis.   CURRENT MEDICATIONS:  1. Calcium.  2. Multivitamin.  3. Plavix 75 mg daily.  4. Aspirin 325 mg daily.  5. Lopressor 50 mg b.i.d.  6. Ferrous sulfate 325 mg t.i.d.  7. Crestor 10 mg daily.  8. DynaCirc 10 mg daily.  9. Benicar 40 mg daily.  10.Janumet 50/1000 mg b.i.d.  11.Nitroglycerin.   ALLERGIES:  No known drug allergies.   PHYSICAL EXAMINATION:  GENERAL:  She is a well-developed, well-nourished  female.  VITAL SIGNS:  Blood pressure 160/97, pulse 59, weight 168 pounds.  HEENT:  Unremarkable.  NECK:   Without JVD.  CARDIAC:  S1, S2.  Regular rate and rhythm.  LUNGS:  Clear to auscultation bilaterally.  ABDOMEN:  Soft, normoactive bowel sounds.  Positive epigastric tenderness to  deep palpation.  EXTREMITIES:  Without edema.  Right groin without hematomas or bruits.   IMPRESSION:  1. Epigastric discomfort.  2. Coronary artery disease.      a.     Status post DES stent to the proximal right coronary artery       December 2005.      b.     Stent to the right coronary artery and minimal nonobstructive       CAD elsewhere by catheterization April 20, 2006.  3. Good left ventricular function.  4. Hypertension.  5. Hyperlipidemia.  6. Diabetes mellitus type 2.  7. Iron deficiency anemia.  8. History of gastroduodenitis.   PLAN:  The patient is doing well from a postcatheterization standpoint.  She  has had no further chest  pain.  She is, however, having some epigastric  discomfort.  I think it is important for her to get on the Protonix that was  prescribed to her.  I advised her that she should follow up with her primary  care physician regarding this.  She has an appointment in December.  If her  symptoms in her stomach are not improving in the next couple of weeks, she  should change that appointment to a sooner time.  Otherwise, if it is  improving, she can see Dr. Allyne Gee in December as scheduled.  She can follow  up with Dr. Antoine Poche in three months.      Tereso Newcomer, PA-C  Electronically Signed      Veverly Fells. Excell Seltzer, MD  Electronically Signed   SW/MedQ  DD: 05/04/2006  DT: 05/04/2006  Job #: [id number]   cc:   Candyce Churn. Allyne Gee, M.D.

## 2010-11-08 NOTE — Consult Note (Signed)
NAME:  [name] [name], HEPLER NO.:  [id number]   MEDICAL RECORD NO.:  [id number]          PATIENT TYPE:  INP   LOCATION:  3728                         FACILITY:  [facility]   PHYSICIAN:  Rollene Rotunda, M.D.   DATE OF BIRTH:  04/14/1951   DATE OF CONSULTATION:  05/19/2004  DATE OF DISCHARGE:                                   CONSULTATION   HISTORY OF PRESENT ILLNESS:  This is a very pleasant 60 year old female who  was admitted to [facility] today by the [facility]  for evaluation of atypical chest pain. The patient had previously been  evaluated for chest pain in April of this year. She had an Adenosine  Cardiolite performed at that time that was negative for ischemia. The  ejection fraction was 65%. Apparently, we saw her during that admission but  I do not believe that there was a formal consultation. The patient states  that she developed chest pain yesterday while working around the house. The  pain came on at approximately 5:00 p.m. She described it as being in the  center of her chest, radiating to her left arm. She stated that the pain was  sharp. It was associated with dyspnea and nausea as well as some  diaphoresis, however, she has occasional diaphoresis associated with hot  flashes. She stated that the pain was an 8 on a 1 to 10 scale. It started  around 5:00 p.m. She went off to bed and when she awoke around 10:00 p.m. to  go to work, she was pain free. 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 She went to work that evening. She later developed pain at  approximately 1:30 this morning. She was told to come to the emergency room  for further evaluation. She received morphine and aspirin in the emergency  room and nitroglycerin paste with eventual relief of her pain. She was  admitted to [facility] for further evaluation.   PAST MEDICAL HISTORY:  Significant for hypertension diagnosed in September  of this year. Long  history of tobacco use.   ALLERGIES:  No known drug allergies. No allergies to contrast dye, shrimp or  Iodine.   CURRENT MEDICATIONS:  Include aspirin 81 mg daily, Lovenox 85 mg  subcutaneously q. 12 hours, Lopressor 50 mg b.i.d., nitroglycerin paste,  Protonix 40 mg daily, morphine p.r.n. for pain.   SOCIAL HISTORY:  The patient moved to [place] from [place] in September  of this year to care for an ailing friend. She has a history of tobacco use.  She has smoked for at least 25 years, approximately 1 pack per day. She uses  alcohol occasionally. She has a remote history of illicit drug use but none  recently. 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   FAMILY HISTORY:  Her mother died from heart disease in her 44's. Her father  also died from a myocardial infarction in his 41's. She has 5 sisters and 2  brothers. One brother died from heart disease.   REVIEW OF SYSTEMS:  Negative  except for as noted above with the addition of  the following:  She has recently had some diaphoretic spells, which she  describes as hot flashes. She has had chest pain and dyspnea on exertion. A  non-productive cough and mild lower extremity edema. She has occasional  numbness of the left side of her body. She denies any history of  cerebrovascular accident or seizure history.   PHYSICAL EXAMINATION:  GENERAL:  A pleasant 60 year old African-American  female in no acute distress.  VITAL SIGNS:  Blood pressure 119/73, pulse 71, respiratory rate 20,  temperature 98.2.  HEENT:  Unremarkable.  NECK:  No bruits. No jugular venous distention.  HEART:  Regular rate and rhythm. Without murmur.  LUNGS:  Clear.  ABDOMEN:  Slightly obese, soft, nontender.  EXTREMITIES:  Pulses intact with trace edema.  SKIN:  Warm and dry.   LABORATORY DATA:  Chest x-ray showed cardiomegaly with pulmonary vascular  congestion and question of minimal edema versus bronchitic changes.   An  electrocardiogram shows normal sinus rhythm with rate of 60 beats per  minute with sinus arrhythmia and non-specific changes with poor R-wave  progression.   Other labs include a BUN of 8, creatinine 0.9, potassium 3.7, glucose 137. A  CBC on admission revealed hemoglobin 11 and hematocrit 33.8. White blood  cell 6.3,000. Platelets 309,000. The CBC was repeated several hours later.  The hemoglobin was 9.9 and hematocrit 30. White blood cell count 5.9,000.  Platelets 272,000. Cardiac enzymes x1 have been negative. A PTT was 33, INR  was 0.9.   IMPRESSION:  1.  Chest pain.  2.  Hypertension.  3.  Long history of tobacco use.  4.  Positive family history of coronary artery disease.  5.  Adenosine Cardiolite October 12, 2003 negative for ischemia and ejection      fraction of 65%.  6.  Anemia this admission.  7.  Remote history of substance abuse.   PLAN:  The patient was seen by both myself and Dr. Antoine Poche. The plan will  be to proceed with cardiac catheterization. This has been discussed with the  patient and she is in agreement. She will also need further evaluation of  her anemia. We will check a CBC in the morning. We will also supplement her  potassium somewhat prior to the catheterization.       DR/MEDQ  D:  05/19/2004  T:  05/19/2004  Job:  [id number]   cc:   [facility]

## 2010-11-08 NOTE — Discharge Summary (Signed)
NAME:  [name] [name], [name] [name]              ACCOUNT NO.:  [id number]   MEDICAL RECORD NO.:  [id number]          PATIENT TYPE:  INP   LOCATION:  6527                         FACILITY:  [facility]   PHYSICIAN:  Deirdre Peer. Polite, M.D. DATE OF BIRTH:  July 24, 1950   DATE OF ADMISSION:  05/19/2004  DATE OF DISCHARGE:  05/25/2004                                 DISCHARGE SUMMARY   DISCHARGE DIAGNOSES:  1.  Admitted with chest pain, found to have coronary artery disease.      Patient underwent catheterization which showed multi-vessel disease,      right coronary artery 80%, distal left main 40%, left anterior      descending 50-60% ostial lesion, circumflex 30% proximal lesion, right      coronary artery 80% proximal lesion, 50-60% distal lesion, ejection      fraction of 60%.  2.  Patient underwent drug-eluting stent in the right coronary artery; will      require Plavix and aspirin times six months.  3.  Hypertension.  4.  Early diabetes, hemoglobin A1c 6.5; will not be discharged with      medications, recommend diet and exercise.  5.  Iron deficient anemia.  The patient with history of menorrhagia.      Recommend followup with Dr. Arbutus Ped as outlined, ferrous sulfate b.i.d.      Please note, the patient has had esophagogastroduodenoscopy and      colonoscopy during this hospitalization without signs of active      bleeding.  Does show diverticulosis in the colon, some internal      hemorrhoids which is most likely cause for the heme positive stool.      __________ gastritis on esophagogastroduodenoscopy.  6.  History of tobacco use.  Recommend stop now.  7.  Positive ANA, unknown significance.  Recommend followup clinically with      further outpatient evaluation as deemed necessary.   DISCHARGE MEDICATIONS:  1.  Plavix 75 mg one daily.  2.  Coated aspirin 325 mg one daily.  3.  Nitroglycerin sublingual p.r.n.  4.  Lopressor 50 mg one every 12 hours.  5.  Ferrous sulfate 325 mg one every 12  hours.  6.  Peri-Colace 100 mg one every 12 hours.  7.  Lipitor 10 mg one daily.   DISPOSITION:  The patient is discharged to home in stable condition and  asked to followup with Dr. Antoine Poche on June 07, 2004, at 4:15, number  will be provided [phone].  Patient is asked to obtain a primary M.D., I  provided a number for her to call.   STUDIES:  1.  Patient underwent cardiac catheterization which showed multi-vessel      disease, proximal 80% RCA, distal 50-60% RCA lesion, distal left main      40% lesion, 50-60% LAD ostial lesion and 30% proximal lesion in the      circumflex, EF 60%.  2.  Patient underwent an EGD which showed nonspecific gastritis.  3.  Colonoscopy showed diverticulosis, internal hemorrhoids.   Hemoglobin A1c 6.5, hemoglobin 10.5, TSH 0.5.  BMET within normal limits.  Iron  studies consistent with iron deficiency.  Iron of 33, % sat 11, folate  1002, ferritin 49.  Chest x-ray:  Pulmonary vascular congestion.   HISTORY OF PRESENT ILLNESS:  60 year-old female presented to the ED  with complaint of chest pain.  Please note patient has had evaluation in the  past for chest pain and had an adenosine Cardiolite which was negative for  ischemia.  However, because of the patient's untreated hypertension, tobacco  use and age admission was deemed necessary for further evaluation and  treatment of chest pain.  Please see admission H&P for further details.   PAST MEDICAL HISTORY:  As stated above.   MEDICATIONS ON ADMISSION:  Patient was somewhat noncompliant with her meds.   HOSPITAL COURSE:  1.  Patient was admitted to a telemetry floor bed for evaluation and      treatment of chest pain.  Patient had cardiac enzymes which were      negative for ischemia.  Patient was seen in consultation by cardiology.      It was recommended patient undergo a cardiac cath.  The cardiac cath      results are as stated above, showed multi-vessel coronary artery       disease.  Patient underwent drug-eluting stent in the RCA.  The patient      is to continue on aspirin and Plavix on an outpatient for 6 months and      will have further outpatient cardiology followup.  2.  Untreated hypertension.  Recommend treatment as outlined with Lopressor      for the titration of meds as deemed necessary by her primary M.D.  3.  Iron deficiency anemia.  Most likely secondary to the patient's history      of menorrhagia.  Recommend iron supplement.  4.  Tobacco abuse.  Recommend stop now.  5.  Early diabetes.  Recommend diet and exercise.  6.  An A1c of 6.5.  Will not start medicines at this time.  Feel that      patient should be established with her primary M.D. before putting her      on an oral hypoglycemic.   At this time, patient is medically stable for discharge.      Joen Laura   RDP/MEDQ  D:  05/25/2004  T:  05/26/2004  Job:  [id number]

## 2010-11-08 NOTE — Consult Note (Signed)
NAME:  [name] [name], [name] [name] NO.:  [id number]   MEDICAL RECORD NO.:  [id number]          PATIENT TYPE:  INP   LOCATION:  3728                         FACILITY:  [facility]   PHYSICIAN:  Iva Boop, M.D. LHCDATE OF BIRTH:  December 22, 1950   DATE OF CONSULTATION:  05/22/2004  DATE OF DISCHARGE:                                   CONSULTATION   REASON FOR CONSULTATION:  Hemoccult positive stool with microcytic anemia.   HISTORY OF PRESENT ILLNESS:  This is a 60 year old African American woman  recently diagnosed with diabetes.  She is a smoker, has hypertension and as  part of an atypical chest pain workup had diffuse coronary artery disease.  She is going to need a right coronary artery stent that required Plavix  therapy because of the drug-eluding stent.  She denies any particular GI  symptoms, i.e. no bleeding, no dysphagia, no chronic heartburn symptoms, or  abdominal pain.   FAMILY HISTORY:  There is no family history of colon cancer.   ALLERGIES:  None known.   MEDICATIONS:  1.  Lopressor.  2.  Protonix 40 mg q.d.  3.  Insulin.  4.  Iron sulfate.  5.  Aspirin.   PAST MEDICAL HISTORY:  1.  Hypertension.  2.  There is a history of anemia and iron therapy in the past.  3.  Longstanding tobacco abuse.  4.  Carpal tunnel syndrome.   PAST SURGICAL HISTORY:  Appendectomy in 2003.   SOCIAL HISTORY:  Two children-one of whom has died secondary to a motor  vehicle accident.  She lives in [place].  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  Half pack per day smoker.  No alcohol.  Remote drug abuse in the 1960s.   FAMILY HISTORY:  Positive for coronary artery disease and MI.   REVIEW OF SYMPTOMS:  Recent dental abscess.  She has loose teeth.  She is  premenopausal with hot flashes.  Her last menstrual period was in September.  She has used some Aleve for hand pain, though not acutely recently.  She has  a lot of joint pains, some dyspnea with  exertion.  She has had the chest  pain.  All other systems are negative at this time.   PHYSICAL EXAMINATION:  GENERAL:  A pleasant, well-developed middle-aged  black woman in no acute distress.  VITAL SIGNS:  Temperature 98.6, pulse 68, respirations 20, blood pressure  164/77.  HEENT:  Anicteric.  Mouth free of lesions.  She has poor dentition.  Some  missing. There are multiple areas of caries and there is a right upper  incisor that is very loose.  NECK:  Supple.  No thyromegaly or mass.  CHEST:  Clear.  HEART:  S1 and S2.  No rubs or gallops.  ABDOMEN:  Soft and nontender without organomegaly or mass.  RECTAL:  On Dr. Arturo Morton. Stuckey's exam showed hemoccult positive stools.  EXTREMITIES:  No clubbing, cyanosis, or edema.  NEUROLOGICAL:  She is alert and oriented x 3.  LYMPH NODES:  No neck or supraclavicular nodes.  LABORATORY DATA:  Hemoglobin 10.9, hematocrit 34%, MCV 74.  Her ferritin is  49.  Iron 33, TIBC 287.  Saturation 11%.  BMET was normal.  B12 was normal.  She has a positive ANA in a speckled pattern.  Coagulases are normal.   ASSESSMENT:  1.  Hemoccult positive stool with microcytic anemia, question iron      deficiency:  I think it would be a mixed picture if so.  She has      features consistent with chronic disease though the low normal ferritin      suggest a mixed picture.  Her RDW is mildly elevated in the 15 range.      Other possible causes of anemia are certainly her menstrual blood loss      in the past.  Need to exclude chronic occult gastrointestinal blood loss      from arteriovenous malformations or tumors.  2.  The patient has a history of intravenous drug abuse and is a new      diabetic:  Testing for hepatitis C is reasonable.  Check liver function      tests and hepatitis C antibodies.   PLAN:  Upper GI endoscopy and colonoscopy on May 23, 2004.  She needs  the colonoscopy and EGD to evaluate this to determine safety of chronic  Plavix  therapy after her percutaneous coronary intervention.   I appreciate the opportunity to care for this patient.  I have explained the  risks, benefits, and indication of these procedures.  She understands and  agrees to proceed.       CEG/MEDQ  D:  05/23/2004  T:  05/23/2004  Job:  [id number]   cc:   Rollene Rotunda, M.D.   Deirdre Peer. Polite, M.D.

## 2011-03-13 LAB — COMPREHENSIVE METABOLIC PANEL
ALT: 19
AST: 25
CO2: 29
Calcium: 9.3
Chloride: 101
GFR calc Af Amer: >60
GFR calc non Af Amer: >60
Sodium: 139
Total Bilirubin: 0.6

## 2011-03-13 LAB — LIPID PANEL
Cholesterol: 94
HDL: 22 — ABNORMAL LOW
LDL Cholesterol: 51

## 2011-03-13 LAB — POCT CARDIAC MARKERS
CKMB, poc: 1.2
Myoglobin, poc: 65.1
Troponin i, poc: <0.05

## 2011-03-13 LAB — I-STAT 8, (EC8 V) (CONVERTED LAB)
Acid-Base Excess: 4 — ABNORMAL HIGH
Chloride: 104
HCT: 39
Hemoglobin: 13.3
Potassium: 3.3 — ABNORMAL LOW
Sodium: 140
pH, Ven: 7.381 — ABNORMAL HIGH

## 2011-03-13 LAB — POCT I-STAT CREATININE: Operator id: 288831

## 2011-03-13 LAB — CARDIAC PANEL(CRET KIN+CKTOT+MB+TROPI)
Total CK: 131
Total CK: 137

## 2011-03-13 LAB — APTT: aPTT: 33

## 2011-03-13 LAB — TROPONIN I: Troponin I: <0.01

## 2011-03-13 LAB — CBC
RBC: 4.49
WBC: 7.7

## 2011-03-13 LAB — DIFFERENTIAL
Eosinophils Absolute: 0.2
Eosinophils Relative: 3
Lymphs Abs: 2.9
Monocytes Absolute: 0.7

## 2011-03-13 LAB — PROTIME-INR: Prothrombin Time: 12.6

## 2011-03-13 LAB — OCCULT BLOOD X 1 CARD TO LAB, STOOL: Fecal Occult Bld: NEGATIVE

## 2011-03-20 LAB — COMPREHENSIVE METABOLIC PANEL
ALT: 19
AST: 22
Albumin: 3.7
Alkaline Phosphatase: 82
GFR calc Af Amer: 57 — ABNORMAL LOW
Potassium: 3.3 — ABNORMAL LOW
Sodium: 142
Total Protein: 7

## 2011-03-20 LAB — URINE MICROSCOPIC-ADD ON

## 2011-03-20 LAB — URINALYSIS, ROUTINE W REFLEX MICROSCOPIC
Bilirubin Urine: NEGATIVE
Glucose, UA: NEGATIVE
Hgb urine dipstick: NEGATIVE
Specific Gravity, Urine: 1.012

## 2011-03-20 LAB — DIFFERENTIAL
Basophils Relative: 1
Eosinophils Absolute: 0.1
Lymphocytes Relative: 32
Monocytes Absolute: 0.6
Neutro Abs: 3.9

## 2011-03-20 LAB — CBC
Platelets: 275
RDW: 15

## 2011-05-01 ENCOUNTER — Other Ambulatory Visit (HOSPITAL_COMMUNITY): Payer: Self-pay | Admitting: Internal Medicine

## 2011-05-01 DIAGNOSIS — Z1231 Encounter for screening mammogram for malignant neoplasm of breast: Secondary | ICD-10-CM

## 2011-05-30 ENCOUNTER — Ambulatory Visit (HOSPITAL_COMMUNITY): Payer: Self-pay

## 2011-07-09 ENCOUNTER — Ambulatory Visit (HOSPITAL_COMMUNITY): Payer: Medicaid Other

## 2011-07-10 ENCOUNTER — Other Ambulatory Visit: Payer: Self-pay | Admitting: Internal Medicine

## 2011-07-10 DIAGNOSIS — Z1231 Encounter for screening mammogram for malignant neoplasm of breast: Secondary | ICD-10-CM

## 2011-07-28 ENCOUNTER — Ambulatory Visit
Admission: RE | Admit: 2011-07-28 | Discharge: 2011-07-28 | Disposition: A | Discharge status: Home / Self Care | Payer: Medicaid Other | Source: Ambulatory Visit | Attending: Internal Medicine | Admitting: Internal Medicine

## 2011-07-28 DIAGNOSIS — Z1231 Encounter for screening mammogram for malignant neoplasm of breast: Secondary | ICD-10-CM

## 2011-08-08 ENCOUNTER — Other Ambulatory Visit: Payer: Self-pay | Admitting: Neurology

## 2011-08-08 DIAGNOSIS — M5481 Occipital neuralgia: Secondary | ICD-10-CM

## 2011-08-08 DIAGNOSIS — M47812 Spondylosis without myelopathy or radiculopathy, cervical region: Secondary | ICD-10-CM

## 2011-08-14 ENCOUNTER — Ambulatory Visit
Admission: RE | Admit: 2011-08-14 | Discharge: 2011-08-14 | Disposition: A | Discharge status: Home / Self Care | Payer: Medicaid Other | Source: Ambulatory Visit | Attending: Neurology | Admitting: Neurology

## 2011-08-14 DIAGNOSIS — M47812 Spondylosis without myelopathy or radiculopathy, cervical region: Secondary | ICD-10-CM

## 2011-08-14 DIAGNOSIS — M5481 Occipital neuralgia: Secondary | ICD-10-CM

## 2012-04-16 ENCOUNTER — Ambulatory Visit: Payer: Medicaid Other | Admitting: Cardiology

## 2012-05-13 ENCOUNTER — Ambulatory Visit (INDEPENDENT_AMBULATORY_CARE_PROVIDER_SITE_OTHER): Payer: PRIVATE HEALTH INSURANCE | Admitting: Cardiology

## 2012-05-13 ENCOUNTER — Encounter: Payer: Self-pay | Admitting: Cardiology

## 2012-05-13 VITALS — BP 166/95 | HR 63 | Wt 172.0 lb

## 2012-05-13 DIAGNOSIS — I2510 Atherosclerotic heart disease of native coronary artery without angina pectoris: Secondary | ICD-10-CM

## 2012-05-13 NOTE — Progress Notes (Signed)
HPI The patient presents for evaluation of chest discomfort. She had a history of a PCI in 2005. She has been away from this practice for greater than 3 years. She says that in September she started noticing some chest discomfort with walking. This doesn't happen every time but when it does she reports a mid sternal burning discomfort. She says there might be some radiation to the right arm with swelling of the right arm. It is moderate in intensity. She might get sweating and diaphoresis. It might last for 5-7 minutes. She's not sure that she's ever had this discomfort before. She has no symptoms at rest. She can do some vigorous activities without bringing on any symptoms. She denies any palpitations, presyncope or syncope. She denies any PND or orthopnea. She has had no weight gain or edema.  No Known Allergies  Current Outpatient Prescriptions  Medication Sig Dispense Refill  . aspirin 81 MG tablet Take 81 mg by mouth daily.      . metoprolol (LOPRESSOR) 50 MG tablet Take 50 mg by mouth 2 (two) times daily.      . Multiple Vitamin (MULTIVITAMIN) capsule Take 1 capsule by mouth daily.      Marland Kitchen olmesartan-hydrochlorothiazide (BENICAR HCT) 40-25 MG per tablet Take 1 tablet by mouth daily.      . Omega-3 Fatty Acids (OMEGA 3 PO) Take 1 tablet by mouth daily.      . potassium chloride SA (K-DUR,KLOR-CON) 20 MEQ tablet Take 1 tablet by mouth Daily.      . rosuvastatin (CRESTOR) 10 MG tablet Take 10 mg by mouth daily.      . sitaGLIPtan-metformin (JANUMET) 50-1000 MG per tablet Take 1 tablet by mouth 2 (two) times daily with a meal.      . vitamin C (ASCORBIC ACID) 500 MG tablet Take 500 mg by mouth daily.      . vitamin E 200 UNIT capsule Take 200 Units by mouth daily.        Past Medical History  Diagnosis Date  . Coronary artery disease   . Chest pain   . Hypertension   . Diabetes mellitus without complication   . Osteoarthritis   . Dyslipidemia   . Anemia   . Carpal tunnel syndrome      Past Surgical History  Procedure Date  . Wrist surgery   . Tonsillectomy   . Appendectomy     Family History  Problem Relation Age of Onset  . CAD Father 30  . CAD Mother 47  . CAD Brother     History   Social History  . Marital Status: Single    Spouse Name: N/A    Number of Children: 2  . Years of Education: N/A   Occupational History  . Not on file.   Social History Main Topics  . Smoking status: Former Smoker    Types: Cigarettes  . Smokeless tobacco: Not on file     Comment: Stopped cigarettes one year.    . Alcohol Use: Not on file  . Drug Use: Not on file  . Sexually Active: Not on file   Other Topics Concern  . Not on file   Social History Narrative   Lives alone.   One son died in a car accident.      ROS:  As stated in the HPI and negative for all other systems.  PHYSICAL EXAM BP 166/95  Pulse 63  Wt 172 lb (78.019 kg) GENERAL:  Well appearing HEENT:  Pupils equal round and reactive, fundi not visualized, oral mucosa unremarkable NECK:  No jugular venous distention, waveform within normal limits, carotid upstroke brisk and symmetric, no bruits, no thyromegaly LYMPHATICS:  No cervical, inguinal adenopathy LUNGS:  Clear to auscultation bilaterally BACK:  No CVA tenderness CHEST:  Unremarkable HEART:  PMI not displaced or sustained,S1 and S2 within normal limits, no S3, no S4, no clicks, no rubs, no murmurs ABD:  Flat, positive bowel sounds normal in frequency in pitch, no bruits, no rebound, no guarding, no midline pulsatile mass, no hepatomegaly, no splenomegaly EXT:  2 plus pulses throughout, no edema, no cyanosis no clubbing SKIN:  No rashes no nodules NEURO:  Cranial nerves II through XII grossly intact, motor grossly intact throughout PSYCH:  Cognitively intact, oriented to person place and time   EKG:  Sinus rhythm, rate 63, axis within normal limits, intervals within normal limits, poor anterior R wave progression, nonspecific T-wave  flattening.  ASSESSMENT AND PLAN  Chest pain - I think there is a moderate pretest probability that her symptoms are coming from coronary disease. Therefore, stress perfusion imaging is indicated.  Dyslipidemia - I will check with SANDERS,ROBYN N, MD to see if there has been a recent lipids with a goal LDL less than 100 and HDL greater than 40.  Hypertension - Her blood pressure is slightly elevated today. She is to keep a blood pressure diary and further management will be based on these readings. She also needs therapeutic lifestyle changes to include possibly a couple of pounds of weight loss. She has physical activity and restricts salt.

## 2012-05-13 NOTE — Patient Instructions (Addendum)
Your physician has requested that you have en exercise stress myoview. For further information please visit [email or web address] Please follow instruction sheet, as given.  Do not take your metoprolol the morning of your test.  Your physician recommends that you continue on your current medications as directed. Please refer to the Current Medication list given to you today.   Your physician wants you to follow-up in: 6 months with Dr. Antoine Poche. You will receive a reminder letter in the mail two months in advance. If you don't receive a letter, please call our office to schedule the follow-up appointment.

## 2012-05-21 NOTE — Addendum Note (Signed)
Addended by: Jarvis Newcomer on: 05/21/2012 01:49 PM   Modules accepted: Orders

## 2012-05-24 ENCOUNTER — Encounter (HOSPITAL_COMMUNITY): Payer: PRIVATE HEALTH INSURANCE

## 2012-05-26 ENCOUNTER — Ambulatory Visit (HOSPITAL_COMMUNITY): Payer: PRIVATE HEALTH INSURANCE | Attending: Cardiology | Admitting: Radiology

## 2012-05-26 VITALS — BP 146/95 | Ht 64.0 in | Wt 169.0 lb

## 2012-05-26 DIAGNOSIS — Z87891 Personal history of nicotine dependence: Secondary | ICD-10-CM | POA: Insufficient documentation

## 2012-05-26 DIAGNOSIS — Z8249 Family history of ischemic heart disease and other diseases of the circulatory system: Secondary | ICD-10-CM | POA: Insufficient documentation

## 2012-05-26 DIAGNOSIS — I10 Essential (primary) hypertension: Secondary | ICD-10-CM | POA: Insufficient documentation

## 2012-05-26 DIAGNOSIS — E119 Type 2 diabetes mellitus without complications: Secondary | ICD-10-CM | POA: Insufficient documentation

## 2012-05-26 DIAGNOSIS — R079 Chest pain, unspecified: Secondary | ICD-10-CM

## 2012-05-26 DIAGNOSIS — I2510 Atherosclerotic heart disease of native coronary artery without angina pectoris: Secondary | ICD-10-CM

## 2012-05-26 DIAGNOSIS — R42 Dizziness and giddiness: Secondary | ICD-10-CM | POA: Insufficient documentation

## 2012-05-26 DIAGNOSIS — E785 Hyperlipidemia, unspecified: Secondary | ICD-10-CM | POA: Insufficient documentation

## 2012-05-26 MED ORDER — TECHNETIUM TC 99M SESTAMIBI GENERIC - CARDIOLITE
33.0000 | Freq: Once | INTRAVENOUS | Status: AC | PRN
  Administered 2012-05-26: 33 via INTRAVENOUS

## 2012-05-26 MED ORDER — TECHNETIUM TC 99M SESTAMIBI GENERIC - CARDIOLITE
11.0000 | Freq: Once | INTRAVENOUS | Status: AC | PRN
  Administered 2012-05-26: 11 via INTRAVENOUS

## 2012-05-26 NOTE — Progress Notes (Signed)
[facility] SITE 3 NUCLEAR MED [address] [id number] [place] [address] [phone]  Cardiology Nuclear Med Study  [name] [name] is a 61 y.o. female     MRN : [id number]     DOB: 12-Dec-1950  Procedure Date: 05/26/2012  Nuclear Med Background Indication for Stress Test:  Evaluation for Ischemia History:  2010 MPS Normal EF 74%, 2007 Catheterization- Stent patent EF 60%,  2005 Stent-RCA Cardiac Risk Factors: Family History - CAD, History of Smoking, Hypertension, Lipids and NIDDM  Symptoms:  Chest Pain (last date of chest discomfort 2 weeks ago) and Light-Headedness   Nuclear Pre-Procedure Caffeine/Decaff Intake:  1:00am patient had two oreo cookies NPO After: 8:30am   Lungs:  clear O2 Sat: 98% on room air. IV 0.9% NS with Angio Cath:  20g  IV Site: R Antecubital x 1, tolerated well IV Started by:  Irean Hong, RN  Chest Size (in):  38 Cup Size: C  Height: 5\' 4"  (1.626 m)  Weight:  169 lb (76.658 kg)  BMI:  Body mass index is 29.01 kg/(m^2). Tech Comments:  Held lopressor x 48 hrs    Nuclear Med Study 1 or 2 day study: 1 day  Stress Test Type:  Stress  Reading MD: Cassell Clement, MD  Order Authorizing Provider:  Rollene Rotunda, MD  Resting Radionuclide: Technetium 56m Sestamibi  Resting Radionuclide Dose: 11.0 mCi   Stress Radionuclide:  Technetium 26m Sestamibi  Stress Radionuclide Dose: 33.0 mCi           Stress Protocol Rest HR: 62 Stress HR: 131  Rest BP: 146/95 Stress BP: 215/107  Exercise Time (min): 8:40 METS: 10.1   Predicted Max HR: 159 bpm % Max HR: 82.39 bpm Rate Pressure Product: 28413   Dose of Adenosine (mg):  n/a Dose of Lexiscan: n/a mg  Dose of Atropine (mg): n/a Dose of Dobutamine: n/a mcg/kg/min (at max HR)  Stress Test Technologist: Bonnita Levan, RN  Nuclear Technologist:  Domenic Polite, CNMT     Rest Procedure:  Myocardial perfusion imaging was performed at rest 45 minutes following the intravenous  administration of Technetium 39m Tetrofosmin. Rest ECG: NSR with non-specific ST-T wave changes  Stress Procedure:  The patient performed treadmill exercise using a Bruce  Protocol for 8:40 minutes. The patient stopped due to dyspnea and burning in chest. Patient reached 82% of THR, could not change to Lexiscan d/t prior caffeine intake. Technetium 55m Tetrofosmin was injected at peak exercise and myocardial perfusion imaging was performed after a brief delay. Stress ECG: No significant change from baseline ECG  QPS Raw Data Images:  Normal; no motion artifact; normal heart/lung ratio. Stress Images:  Normal homogeneous uptake in all areas of the myocardium. Rest Images:  Normal homogeneous uptake in all areas of the myocardium. Subtraction (SDS):  No evidence of ischemia. Transient Ischemic Dilatation (Normal <1.22):  1.06 Lung/Heart Ratio (Normal <0.45):  0.42  Quantitative Gated Spect Images QGS EDV:  69 ml QGS ESV:  19 ml  Impression Exercise Capacity:  Good exercise capacity. BP Response:  Hypertensive blood pressure response. Clinical Symptoms:  Mild chest pain/dyspnea. ECG Impression:  No significant ST segment change suggestive of ischemia. Comparison with Prior Nuclear Study: No images to compare  Overall Impression:  Normal stress nuclear study.  LV Ejection Fraction: 72%.  LV Wall Motion:  NL LV Function; NL Wall Motion  [employer]

## 2012-07-20 ENCOUNTER — Other Ambulatory Visit: Payer: Self-pay | Admitting: Internal Medicine

## 2012-07-20 DIAGNOSIS — Z1231 Encounter for screening mammogram for malignant neoplasm of breast: Secondary | ICD-10-CM

## 2012-08-20 ENCOUNTER — Ambulatory Visit
Admission: RE | Admit: 2012-08-20 | Discharge: 2012-08-20 | Disposition: A | Discharge status: Home / Self Care | Payer: PRIVATE HEALTH INSURANCE | Source: Ambulatory Visit | Attending: Internal Medicine | Admitting: Internal Medicine

## 2013-07-19 ENCOUNTER — Other Ambulatory Visit: Payer: Self-pay

## 2013-07-19 DIAGNOSIS — Z1231 Encounter for screening mammogram for malignant neoplasm of breast: Secondary | ICD-10-CM

## 2013-08-22 ENCOUNTER — Ambulatory Visit
Admission: RE | Admit: 2013-08-22 | Discharge: 2013-08-22 | Disposition: A | Discharge status: Home / Self Care | Payer: PRIVATE HEALTH INSURANCE | Source: Ambulatory Visit

## 2013-08-22 DIAGNOSIS — Z1231 Encounter for screening mammogram for malignant neoplasm of breast: Secondary | ICD-10-CM

## 2014-01-03 ENCOUNTER — Encounter: Payer: Self-pay | Admitting: Cardiology

## 2014-01-03 ENCOUNTER — Ambulatory Visit (INDEPENDENT_AMBULATORY_CARE_PROVIDER_SITE_OTHER): Payer: PRIVATE HEALTH INSURANCE | Admitting: Cardiology

## 2014-01-03 ENCOUNTER — Ambulatory Visit: Payer: PRIVATE HEALTH INSURANCE | Admitting: Cardiology

## 2014-01-03 VITALS — BP 130/70 | HR 64 | Ht 65.0 in | Wt 177.0 lb

## 2014-01-03 DIAGNOSIS — I10 Essential (primary) hypertension: Secondary | ICD-10-CM

## 2014-01-03 DIAGNOSIS — I2510 Atherosclerotic heart disease of native coronary artery without angina pectoris: Secondary | ICD-10-CM

## 2014-01-03 NOTE — Progress Notes (Signed)
HPI The patient presents for evaluation of chest discomfort. She had a history of a PCI in 2005.  She had a stress perfusion study in 2013 demonstrated no evidence of ischemia and well-preserved ejection fraction. Since I last saw her she has done well. She has been exercising routinely. She denies any cardiovascular symptoms. The patient denies any new symptoms such as chest discomfort, neck or arm discomfort. There has been no new shortness of breath, PND or orthopnea. There have been no reported palpitations, presyncope or syncope.    No Known Allergies  Current Outpatient Prescriptions  Medication Sig Dispense Refill  . aspirin 81 MG tablet Take 81 mg by mouth daily.      . carisoprodol (SOMA) 350 MG tablet Take 350 mg by mouth 3 (three) times daily.      . chlorthalidone (HYGROTON) 25 MG tablet       . Cholecalciferol ([employer] VIT D 5000 HIGH-POTENCY PO) Take by mouth.      . metoprolol (LOPRESSOR) 50 MG tablet Take 50 mg by mouth 2 (two) times daily.      . Multiple Vitamin (MULTIVITAMIN) capsule Take 1 capsule by mouth daily.      Marland Kitchen olmesartan-hydrochlorothiazide (BENICAR HCT) 40-25 MG per tablet Take 1 tablet by mouth daily.      . Omega-3 Fatty Acids (OMEGA 3 PO) Take 1 tablet by mouth daily.      . rosuvastatin (CRESTOR) 10 MG tablet Take 10 mg by mouth daily.      . sitaGLIPtan-metformin (JANUMET) 50-1000 MG per tablet Take 1 tablet by mouth 2 (two) times daily with a meal.      . vitamin C (ASCORBIC ACID) 500 MG tablet Take 500 mg by mouth daily.      . vitamin E 200 UNIT capsule Take 200 Units by mouth daily.       No current facility-administered medications for this visit.    Past Medical History  Diagnosis Date  . Coronary artery disease     DES to ostial RCA 2005  . Hypertension   . Diabetes mellitus without complication   . Osteoarthritis   . Dyslipidemia   . Anemia   . Carpal tunnel syndrome     Past Surgical History  Procedure Laterality Date  . Wrist surgery     . Tonsillectomy    . Appendectomy      ROS:  As stated in the HPI and negative for all other systems.  PHYSICAL EXAM BP 130/70  Pulse 64  Ht 5\' 5"  (1.651 m)  Wt 177 lb (80.287 kg)  BMI 29.45 kg/m2 GENERAL:  Well appearing HEENT:  Pupils equal round and reactive, fundi not visualized, oral mucosa unremarkable NECK:  No jugular venous distention, waveform within normal limits, carotid upstroke brisk and symmetric, no bruits, no thyromegaly LYMPHATICS:  No cervical, inguinal adenopathy LUNGS:  Clear to auscultation bilaterally BACK:  No CVA tenderness CHEST:  Unremarkable HEART:  PMI not displaced or sustained,S1 and S2 within normal limits, no S3, no S4, no clicks, no rubs, no murmurs ABD:  Flat, positive bowel sounds normal in frequency in pitch, no bruits, no rebound, no guarding, no midline pulsatile mass, no hepatomegaly, no splenomegaly EXT:  2 plus pulses throughout, no edema, no cyanosis no clubbing SKIN:  No rashes no nodules NEURO:  Cranial nerves II through XII grossly intact, motor grossly intact throughout PSYCH:  Cognitively intact, oriented to person place and time   EKG:  Sinus rhythm, rate 64, axis  within normal limits, intervals within normal limits, poor anterior R wave progression, nonspecific T-wave flattening.01/03/2014   ASSESSMENT AND PLAN  Chest pain - The patient has no new sypmtoms.  No further cardiovascular testing is indicated.  We will continue with aggressive risk reduction and meds as listed.  Dyslipidemia - I did get her lipid profile sent to the office.  The LDL was 94 with and HDL of 61.  This is a good ratio and she will stay on the meds as listed.   Hypertension - The blood pressure is at target. No change in medications is indicated. We will continue with therapeutic lifestyle changes (TLC).  Tobacco smoke - She stopped smoking and I congratulated her on this.

## 2014-01-03 NOTE — Patient Instructions (Signed)
Your physician recommends that you schedule a follow-up appointment in: one year with Dr. Hochrein  

## 2014-01-04 NOTE — Addendum Note (Signed)
Addended by: Abram SanderSIGMON, Chuong Casebeer J on: 01/04/2014 02:22 PM   Modules accepted: Orders

## 2014-07-25 ENCOUNTER — Other Ambulatory Visit: Payer: Self-pay

## 2014-07-25 DIAGNOSIS — Z1231 Encounter for screening mammogram for malignant neoplasm of breast: Secondary | ICD-10-CM

## 2014-08-03 ENCOUNTER — Emergency Department (HOSPITAL_COMMUNITY)
Admission: EM | Admit: 2014-08-03 | Discharge: 2014-08-03 | Disposition: A | Discharge status: Home / Self Care | Payer: Medicare Other | Attending: Emergency Medicine | Admitting: Emergency Medicine

## 2014-08-03 ENCOUNTER — Emergency Department (HOSPITAL_COMMUNITY): Payer: Medicare Other

## 2014-08-03 ENCOUNTER — Encounter (HOSPITAL_COMMUNITY): Payer: Self-pay | Admitting: Emergency Medicine

## 2014-08-03 DIAGNOSIS — I10 Essential (primary) hypertension: Secondary | ICD-10-CM | POA: Diagnosis not present

## 2014-08-03 DIAGNOSIS — Z7982 Long term (current) use of aspirin: Secondary | ICD-10-CM | POA: Diagnosis not present

## 2014-08-03 DIAGNOSIS — M1990 Unspecified osteoarthritis, unspecified site: Secondary | ICD-10-CM | POA: Diagnosis not present

## 2014-08-03 DIAGNOSIS — E119 Type 2 diabetes mellitus without complications: Secondary | ICD-10-CM | POA: Insufficient documentation

## 2014-08-03 DIAGNOSIS — Z862 Personal history of diseases of the blood and blood-forming organs and certain disorders involving the immune mechanism: Secondary | ICD-10-CM | POA: Insufficient documentation

## 2014-08-03 DIAGNOSIS — Z79899 Other long term (current) drug therapy: Secondary | ICD-10-CM | POA: Diagnosis not present

## 2014-08-03 DIAGNOSIS — I2510 Atherosclerotic heart disease of native coronary artery without angina pectoris: Secondary | ICD-10-CM | POA: Diagnosis not present

## 2014-08-03 DIAGNOSIS — Z8669 Personal history of other diseases of the nervous system and sense organs: Secondary | ICD-10-CM | POA: Diagnosis not present

## 2014-08-03 DIAGNOSIS — Z87891 Personal history of nicotine dependence: Secondary | ICD-10-CM | POA: Insufficient documentation

## 2014-08-03 DIAGNOSIS — R0789 Other chest pain: Secondary | ICD-10-CM | POA: Insufficient documentation

## 2014-08-03 DIAGNOSIS — Z791 Long term (current) use of non-steroidal anti-inflammatories (NSAID): Secondary | ICD-10-CM | POA: Insufficient documentation

## 2014-08-03 DIAGNOSIS — E785 Hyperlipidemia, unspecified: Secondary | ICD-10-CM | POA: Diagnosis not present

## 2014-08-03 DIAGNOSIS — R079 Chest pain, unspecified: Secondary | ICD-10-CM | POA: Diagnosis present

## 2014-08-03 LAB — CBC
HCT: 37.4 % (ref 36.0–46.0)
Hemoglobin: 12 g/dL (ref 12.0–15.0)
MCH: 23.9 pg — AB (ref 26.0–34.0)
MCHC: 32.1 g/dL (ref 30.0–36.0)
MCV: 74.5 fL — AB (ref 78.0–100.0)
Platelets: 234 10*3/uL (ref 150–400)
RBC: 5.02 MIL/uL (ref 3.87–5.11)
RDW: 14.9 % (ref 11.5–15.5)
WBC: 4.9 10*3/uL (ref 4.0–10.5)

## 2014-08-03 LAB — BASIC METABOLIC PANEL
ANION GAP: 8 (ref 5–15)
BUN: 13 mg/dL (ref 6–23)
CALCIUM: 9.8 mg/dL (ref 8.4–10.5)
CHLORIDE: 101 mmol/L (ref 96–112)
CO2: 35 mmol/L — ABNORMAL HIGH (ref 19–32)
CREATININE: 0.83 mg/dL (ref 0.50–1.10)
GFR calc non Af Amer: 73 mL/min — ABNORMAL LOW (ref >90)
GFR, EST AFRICAN AMERICAN: 85 mL/min — AB (ref >90)
Glucose, Bld: 105 mg/dL — ABNORMAL HIGH (ref 70–99)
Potassium: 3.8 mmol/L (ref 3.5–5.1)
Sodium: 144 mmol/L (ref 135–145)

## 2014-08-03 LAB — I-STAT TROPONIN, ED
Troponin i, poc: 0 ng/mL (ref 0.00–0.08)
Troponin i, poc: 0 ng/mL (ref 0.00–0.08)

## 2014-08-03 MED ORDER — ACETAMINOPHEN 325 MG PO TABS
650.0000 mg | ORAL_TABLET | Freq: Once | ORAL | Status: AC
  Administered 2014-08-03: 650 mg via ORAL
  Filled 2014-08-03: qty 2

## 2014-08-03 NOTE — ED Notes (Signed)
Pt with Hx of CAD and coronary stent c/o left sided pain on arm, chest, rib cage onset on Sunday.

## 2014-08-03 NOTE — ED Notes (Signed)
Awake. Verbally responsive. A/O x4. Resp even and unlabored. No audible adventitious breath sounds noted. ABC's intact.  

## 2014-08-03 NOTE — ED Notes (Signed)
Awake. Verbally responsive. A/O x4. Resp even and unlabored. No audible adventitious breath sounds noted. ABC's intact. Family at bedside. 

## 2014-08-03 NOTE — ED Notes (Signed)
Patient is resting comfortably. 

## 2014-08-03 NOTE — ED Notes (Signed)
MD at bedside and reported to d/c EKG.

## 2014-08-03 NOTE — ED Notes (Signed)
Awake. Verbally responsive. A/O x4. Resp even and unlabored. No audible adventitious breath sounds noted. ABC's intact. Pt denies diaphoresis/SHOB/nausea. Pt reported pain started on RLQ and radiated to Lt lateral torso. Pt reported sharp pain to LUA but denies chest pain.

## 2014-08-03 NOTE — ED Notes (Signed)
MD at bedside. 

## 2014-08-03 NOTE — Discharge Instructions (Signed)
Chest Wall Pain °Chest wall pain is pain in or around the bones and muscles of your chest. It may take up to 6 weeks to get better. It may take longer if you must stay physically active in your work and activities.  °CAUSES  °Chest wall pain may happen on its own. However, it may be caused by: °· A viral illness like the flu. °· Injury. °· Coughing. °· Exercise. °· Arthritis. °· Fibromyalgia. °· Shingles. °HOME CARE INSTRUCTIONS  °· Avoid overtiring physical activity. Try not to strain or perform activities that cause pain. This includes any activities using your chest or your abdominal and side muscles, especially if heavy weights are used. °· Put ice on the sore area. °¨ Put ice in a plastic bag. °¨ Place a towel between your skin and the bag. °¨ Leave the ice on for 15-20 minutes per hour while awake for the first 2 days. °· Only take over-the-counter or prescription medicines for pain, discomfort, or fever as directed by your caregiver. °SEEK IMMEDIATE MEDICAL CARE IF:  °· Your pain increases, or you are very uncomfortable. °· You have a fever. °· Your chest pain becomes worse. °· You have new, unexplained symptoms. °· You have nausea or vomiting. °· You feel sweaty or lightheaded. °· You have a cough with phlegm (sputum), or you cough up blood. °MAKE SURE YOU:  °· Understand these instructions. °· Will watch your condition. °· Will get help right away if you are not doing well or get worse. °Document Released: 06/09/2005 Document Revised: 09/01/2011 Document Reviewed: 02/03/2011 °ExitCare® Patient Information ©2015 [employer]. This information is not intended to replace advice given to you by your health care provider. Make sure you discuss any questions you have with your health care provider. ° °Chest Pain (Nonspecific) °It is often hard to give a specific diagnosis for the cause of chest pain. There is always a chance that your pain could be related to something serious, such as a heart attack or a blood  clot in the lungs. You need to follow up with your health care provider for further evaluation. °CAUSES  °· Heartburn. °· Pneumonia or bronchitis. °· Anxiety or stress. °· Inflammation around your heart (pericarditis) or lung (pleuritis or pleurisy). °· A blood clot in the lung. °· A collapsed lung (pneumothorax). It can develop suddenly on its own (spontaneous pneumothorax) or from trauma to the chest. °· Shingles infection (herpes zoster virus). °The chest wall is composed of bones, muscles, and cartilage. Any of these can be the source of the pain. °· The bones can be bruised by injury. °· The muscles or cartilage can be strained by coughing or overwork. °· The cartilage can be affected by inflammation and become sore (costochondritis). °DIAGNOSIS  °Lab tests or other studies may be needed to find the cause of your pain. Your health care provider may have you take a test called an ambulatory electrocardiogram (ECG). An ECG records your heartbeat patterns over a 24-hour period. You may also have other tests, such as: °· Transthoracic echocardiogram (TTE). During echocardiography, sound waves are used to evaluate how blood flows through your heart. °· Transesophageal echocardiogram (TEE). °· Cardiac monitoring. This allows your health care provider to monitor your heart rate and rhythm in real time. °· Holter monitor. This is a portable device that records your heartbeat and can help diagnose heart arrhythmias. It allows your health care provider to track your heart activity for several days, if needed. °· Stress tests by   exercise or by giving medicine that makes the heart beat faster. °TREATMENT  °· Treatment depends on what may be causing your chest pain. Treatment may include: °¨ Acid blockers for heartburn. °¨ Anti-inflammatory medicine. °¨ Pain medicine for inflammatory conditions. °¨ Antibiotics if an infection is present. °· You may be advised to change lifestyle habits. This includes stopping smoking and  avoiding alcohol, caffeine, and chocolate. °· You may be advised to keep your head raised (elevated) when sleeping. This reduces the chance of acid going backward from your stomach into your esophagus. °Most of the time, nonspecific chest pain will improve within 2-3 days with rest and mild pain medicine.  °HOME CARE INSTRUCTIONS  °· If antibiotics were prescribed, take them as directed. Finish them even if you start to feel better. °· For the next few days, avoid physical activities that bring on chest pain. Continue physical activities as directed. °· Do not use any tobacco products, including cigarettes, chewing tobacco, or electronic cigarettes. °· Avoid drinking alcohol. °· Only take medicine as directed by your health care provider. °· Follow your health care provider's suggestions for further testing if your chest pain does not go away. °· Keep any follow-up appointments you made. If you do not go to an appointment, you could develop lasting (chronic) problems with pain. If there is any problem keeping an appointment, call to reschedule. °SEEK MEDICAL CARE IF:  °· Your chest pain does not go away, even after treatment. °· You have a rash with blisters on your chest. °· You have a fever. °SEEK IMMEDIATE MEDICAL CARE IF:  °· You have increased chest pain or pain that spreads to your arm, neck, jaw, back, or abdomen. °· You have shortness of breath. °· You have an increasing cough, or you cough up blood. °· You have severe back or abdominal pain. °· You feel nauseous or vomit. °· You have severe weakness. °· You faint. °· You have chills. °This is an emergency. Do not wait to see if the pain will go away. Get medical help at once. Call your local emergency services (911 in [place] Do not drive yourself to the hospital. °MAKE SURE YOU:  °· Understand these instructions. °· Will watch your condition. °· Will get help right away if you are not doing well or get worse. °Document Released: 03/19/2005 Document Revised:  06/14/2013 Document Reviewed: 01/13/2008 °ExitCare® Patient Information ©2015 [employer]. This information is not intended to replace advice given to you by your health care provider. Make sure you discuss any questions you have with your health care provider. ° °

## 2014-08-03 NOTE — ED Provider Notes (Signed)
CSN: [id number]     Arrival date & time 08/03/14  1218 History   First MD Initiated Contact with Patient 08/03/14 1456     Chief Complaint  Patient presents with  . Chest Pain     (Consider location/radiation/quality/duration/timing/severity/associated sxs/prior Treatment) HPI  64 year old female presents with chest pain that started 4 days ago. Originally was on the right side and now is on her left lower chest and back. Patient states any type of movement, especially bending over makes the pain worse. The nurses note mentions abdominal pain to the patient states is always been in her chest. No nausea, vomiting, shortness of breath, or diaphoresis. The pain does occasionally worsen but over the past 24 hours it has been constant. Has a prior history of CAD with a stent in 2005, never had chest pain at that time and has never had pain similar to this. Has not noticed a rash. No pleuritic symptoms. No cough. No leg swelling or leg pain. The pain is sharp. Has not taken anything for the pain. Symptoms do not worsen with exertion.  Past Medical History  Diagnosis Date  . Coronary artery disease     DES to ostial RCA 2005  . Hypertension   . Diabetes mellitus without complication     No longer taking meds  . Osteoarthritis   . Dyslipidemia   . Anemia   . Carpal tunnel syndrome    Past Surgical History  Procedure Laterality Date  . Wrist surgery    . Tonsillectomy    . Appendectomy     Family History  Problem Relation Age of Onset  . CAD Father 7363  . CAD Mother [id number]  . CAD Brother    History  Substance Use Topics  . Smoking status: Former Smoker    Types: Cigarettes  . Smokeless tobacco: Not on file     Comment: Stopped cigarettes one year.    . Alcohol Use: Not on file   OB History    No data available     Review of Systems  Constitutional: Negative for fever and diaphoresis.  Respiratory: Negative for cough and shortness of breath.   Cardiovascular: Positive for chest  pain. Negative for leg swelling.  Gastrointestinal: Negative for nausea and vomiting.  Neurological: Negative for numbness.  All other systems reviewed and are negative.     Allergies  Review of patient's allergies indicates no known allergies.  Home Medications   Prior to Admission medications   Medication Sig Start Date End Date Taking? Authorizing Provider  amLODipine (NORVASC) 5 MG tablet Take 1 tablet by mouth daily. 07/19/14  Yes Historical Provider, MD  aspirin 81 MG tablet Take 81 mg by mouth daily.   Yes Historical Provider, MD  chlorthalidone (HYGROTON) 25 MG tablet Take 12.5 mg by mouth daily.  11/12/13  Yes Historical Provider, MD  Cholecalciferol ([employer] VIT D 5000 HIGH-POTENCY PO) Take 1 tablet by mouth daily.    Yes Historical Provider, MD  diazepam (VALIUM) 5 MG tablet Take 1 tablet by mouth 2 (two) times daily. 06/29/14  Yes Historical Provider, MD  Eszopiclone 3 MG TABS Take 1 tablet by mouth at bedtime as needed (sleep).  07/19/14  Yes Historical Provider, MD  metoprolol (LOPRESSOR) 50 MG tablet Take 50 mg by mouth 2 (two) times daily.   Yes Historical Provider, MD  Multiple Vitamin (MULTIVITAMIN) capsule Take 1 capsule by mouth daily.   Yes Historical Provider, MD  naproxen (NAPROSYN) 500 MG tablet Take 1 tablet  by mouth daily. 07/20/14  Yes Historical Provider, MD  olmesartan-hydrochlorothiazide (BENICAR HCT) 40-25 MG per tablet Take 1 tablet by mouth daily.   Yes Historical Provider, MD  Omega-3 Fatty Acids (OMEGA 3 PO) Take 1 tablet by mouth daily.   Yes Historical Provider, MD  rosuvastatin (CRESTOR) 10 MG tablet Take 10 mg by mouth daily.   Yes Historical Provider, MD  vitamin C (ASCORBIC ACID) 500 MG tablet Take 500 mg by mouth daily.   Yes Historical Provider, MD  vitamin E 200 UNIT capsule Take 200 Units by mouth daily.   Yes Historical Provider, MD   BP 158/86 mmHg  Pulse 62  Temp(Src) 97.8 F (36.6 C) (Oral)  Resp 16  SpO2 100% Physical Exam  Constitutional:  She is oriented to person, place, and time. She appears well-developed and well-nourished.  HENT:  Head: Normocephalic and atraumatic.  Right Ear: External ear normal.  Left Ear: External ear normal.  Nose: Nose normal.  Eyes: Right eye exhibits no discharge. Left eye exhibits no discharge.  Cardiovascular: Normal rate, regular rhythm and normal heart sounds.   Pulmonary/Chest: Effort normal and breath sounds normal. She exhibits tenderness.    Abdominal: Soft. She exhibits no distension. There is no tenderness.  Musculoskeletal: She exhibits no edema or tenderness.  Neurological: She is alert and oriented to person, place, and time.  Skin: Skin is warm and dry.  Nursing note and vitals reviewed.   ED Course  Procedures (including critical care time) Labs Review Labs Reviewed  CBC - Abnormal; Notable for the following:    MCV 74.5 (*)    MCH 23.9 (*)    All other components within normal limits  BASIC METABOLIC PANEL - Abnormal; Notable for the following:    CO2 35 (*)    Glucose, Bld 105 (*)    GFR calc non Af Amer 73 (*)    GFR calc Af Amer 85 (*)    All other components within normal limits  I-STAT TROPOININ, ED  Rosezena Sensor, ED    Imaging Review Dg Chest 2 View  08/03/2014   CLINICAL DATA:  Left-sided chest pain.  Hypertension  EXAM: CHEST  2 VIEW  COMPARISON:  May 04, 2009  FINDINGS: There is no edema or consolidation. Heart is upper normal in size with pulmonary vascularity within normal limits. Aorta is slightly prominent. No adenopathy. There is degenerative change in the thoracic spine.  IMPRESSION: No edema or consolidation. Aorta slightly prominent. Question chronic hypertensive change as etiology.   Electronically Signed   By: Bretta Bang III M.D.   On: 08/03/2014 13:42     EKG Interpretation   Date/Time:  Thursday August 03 2014 12:27:11 EST Ventricular Rate:  54 PR Interval:  169 QRS Duration: 88 QT Interval:  439 QTC Calculation:  416 R Axis:   -16 Text Interpretation:  Sinus or ectopic atrial rhythm Borderline left axis  deviation Low voltage, precordial leads Probable anteroseptal infarct, old  Borderline T abnormalities, inferior leads Baseline wander in lead(s) III  Confirmed by ZACKOWSKI  MD, Charlton Boule [phone]) on 08/03/2014 12:30:57 PM      MDM   Final diagnoses:  Chest wall pain    Patient's pain is localized over her lower ribs. There is no rash noted. There is no abdominal tenderness. Unclear why it started on the right side originally. This is very atypical and seems unlikely to be ACS. Initial troponin is negative. EKG shows inferior T-wave abnormalities which seems in a different place  than in 2015. Has always had some T wave abnormalities. No ST depression/elevation. D/w Cardiology, Dr. Tresa Endo, who reviewed this and past EKGs and feels these are very nonspecific and unlikely to be acute cardiac in nature. Recommends discharge with outpatient f/u with Dr. Antoine Poche. Given pain with palpation and any type of movement this is more likely MSK. Doubt PE, no tachycardia, dyspnea, or hypoxia. Discussed strict return precautions and plan.    Audree Camel, MD 08/03/14 [phone]

## 2014-08-23 ENCOUNTER — Ambulatory Visit (INDEPENDENT_AMBULATORY_CARE_PROVIDER_SITE_OTHER): Payer: Medicare Other | Admitting: Cardiology

## 2014-08-23 ENCOUNTER — Encounter: Payer: Self-pay | Admitting: Cardiology

## 2014-08-23 VITALS — BP 112/70 | HR 60 | Ht 65.0 in | Wt 181.0 lb

## 2014-08-23 DIAGNOSIS — R0789 Other chest pain: Secondary | ICD-10-CM

## 2014-08-23 NOTE — Patient Instructions (Signed)
Your physician recommends that you schedule a follow-up appointment in: one year with Dr. Antoine PocheHochrein  We are ordering a stress test for you to get done after April 8th 2016

## 2014-08-23 NOTE — Progress Notes (Signed)
HPI The patient presents for evaluation of back pain. She had a history of a PCI in 2005.  She had a stress perfusion study in 2013 demonstrated no evidence of ischemia and well-preserved ejection fraction.   Yesterday she did quite housework. Today she is having some back pain. This is a point tenderness that is worse with movement or lying flat. She actually had the same kind of discomfort though slightly broader last month and she was in the emergency room. I reviewed these records. There was no active evidence of ischemia. Was felt to be a musculoskeletal pain. Pain she had starting yesterday is slightly improved today after Advil. She's not having any substernal chest pressure, neck or arm discomfort. Not having any new shortness of breath, PND or orthopnea. She's had no palpitations, presyncope or syncope. She's had no weight gain or edema. She's very active.  No Known Allergies  Current Outpatient Prescriptions  Medication Sig Dispense Refill  . amLODipine (NORVASC) 5 MG tablet Take 1 tablet by mouth daily.  5  . aspirin 81 MG tablet Take 81 mg by mouth daily.    . chlorthalidone (HYGROTON) 25 MG tablet Take 12.5 mg by mouth daily.     . Cholecalciferol ([employer] VIT D 5000 HIGH-POTENCY PO) Take 1 tablet by mouth daily.     . diazepam (VALIUM) 5 MG tablet Take 1 tablet by mouth 2 (two) times daily.  5  . Eszopiclone 3 MG TABS Take 1 tablet by mouth at bedtime as needed (sleep).   1  . Iron Combinations (IRON COMPLEX PO) Take 65 mg by mouth.    . metoprolol (LOPRESSOR) 50 MG tablet Take 50 mg by mouth 2 (two) times daily.    . Multiple Vitamin (MULTIVITAMIN) capsule Take 1 capsule by mouth daily.    . naproxen (NAPROSYN) 500 MG tablet Take 1 tablet by mouth daily.  3  . olmesartan-hydrochlorothiazide (BENICAR HCT) 40-25 MG per tablet Take 1 tablet by mouth daily.    . Omega-3 Fatty Acids (OMEGA 3 PO) Take 1 tablet by mouth daily.    . rosuvastatin (CRESTOR) 10 MG tablet Take 10 mg by mouth  daily.    . vitamin C (ASCORBIC ACID) 500 MG tablet Take 500 mg by mouth daily.    . vitamin E 200 UNIT capsule Take 200 Units by mouth daily.     No current facility-administered medications for this visit.    Past Medical History  Diagnosis Date  . Coronary artery disease     DES to ostial RCA 2005  . Hypertension   . Diabetes mellitus without complication     No longer taking meds  . Osteoarthritis   . Dyslipidemia   . Anemia   . Carpal tunnel syndrome     Past Surgical History  Procedure Laterality Date  . Wrist surgery    . Tonsillectomy    . Appendectomy      ROS:  As stated in the HPI and negative for all other systems.  PHYSICAL EXAM BP 112/70 mmHg  Pulse 60  Ht 5\' 5"  (1.651 m)  Wt 181 lb (82.101 kg)  BMI 30.12 kg/m2 GENERAL:  Well appearing LUNGS:  Clear to auscultation bilaterally BACK:  Point tenderness to palpation posterior left ribs CHEST:  Unremarkable HEART:  PMI not displaced or sustained,S1 and S2 within normal limits, no S3, no S4, no clicks, no rubs, no murmurs ABD:  Flat, positive bowel sounds normal in frequency in pitch, no bruits, no rebound,  no guarding, no midline pulsatile mass, no hepatomegaly, no splenomegaly EXT:  2 plus pulses throughout, no edema, no cyanosis no clubbing SKIN:  No rashes no nodules    EKG:  Possible ectopic atrial rhythm, rate 60, axis within normal limits, intervals within normal limits, poor anterior R wave progression, nonspecific T-wave flattening.08/23/2014   ASSESSMENT AND PLAN  CAD - Given the fact she didn't have symptoms before a close eye on her. I'll bring her back and screen her with a POET (Plain Old Exercise Treadmill)  Back pain - I suspect muscle spasm or a pulled muscle. We talked about conservative therapies. This is not cardiac pain.  Dyslipidemia - This is followed by [name], MD  Hypertension - The blood pressure is at target. No change in medications is indicated. We will continue  with therapeutic lifestyle changes (TLC).  Tobacco smoke - She stopped smoking and I congratulated her on this.

## 2014-08-24 ENCOUNTER — Ambulatory Visit
Admission: RE | Admit: 2014-08-24 | Discharge: 2014-08-24 | Disposition: A | Discharge status: Home / Self Care | Payer: Medicare Other | Source: Ambulatory Visit

## 2014-08-24 DIAGNOSIS — Z1231 Encounter for screening mammogram for malignant neoplasm of breast: Secondary | ICD-10-CM

## 2014-09-19 ENCOUNTER — Observation Stay (HOSPITAL_COMMUNITY)
Admission: EM | Admit: 2014-09-19 | Discharge: 2014-09-21 | Disposition: A | Discharge status: Home / Self Care | Payer: Medicare Other | Attending: Internal Medicine | Admitting: Internal Medicine

## 2014-09-19 ENCOUNTER — Encounter (HOSPITAL_COMMUNITY): Payer: Self-pay | Admitting: Emergency Medicine

## 2014-09-19 ENCOUNTER — Other Ambulatory Visit (HOSPITAL_COMMUNITY): Payer: Self-pay

## 2014-09-19 ENCOUNTER — Observation Stay (HOSPITAL_COMMUNITY): Payer: Medicare Other

## 2014-09-19 DIAGNOSIS — I10 Essential (primary) hypertension: Secondary | ICD-10-CM | POA: Diagnosis present

## 2014-09-19 DIAGNOSIS — R739 Hyperglycemia, unspecified: Secondary | ICD-10-CM | POA: Diagnosis not present

## 2014-09-19 DIAGNOSIS — E785 Hyperlipidemia, unspecified: Secondary | ICD-10-CM | POA: Insufficient documentation

## 2014-09-19 DIAGNOSIS — Z7982 Long term (current) use of aspirin: Secondary | ICD-10-CM | POA: Diagnosis not present

## 2014-09-19 DIAGNOSIS — N390 Urinary tract infection, site not specified: Secondary | ICD-10-CM | POA: Diagnosis not present

## 2014-09-19 DIAGNOSIS — Z955 Presence of coronary angioplasty implant and graft: Secondary | ICD-10-CM | POA: Insufficient documentation

## 2014-09-19 DIAGNOSIS — E871 Hypo-osmolality and hyponatremia: Secondary | ICD-10-CM | POA: Diagnosis not present

## 2014-09-19 DIAGNOSIS — R5383 Other fatigue: Secondary | ICD-10-CM

## 2014-09-19 DIAGNOSIS — Z87891 Personal history of nicotine dependence: Secondary | ICD-10-CM | POA: Diagnosis not present

## 2014-09-19 DIAGNOSIS — D509 Iron deficiency anemia, unspecified: Secondary | ICD-10-CM | POA: Diagnosis not present

## 2014-09-19 DIAGNOSIS — N179 Acute kidney failure, unspecified: Secondary | ICD-10-CM | POA: Diagnosis not present

## 2014-09-19 DIAGNOSIS — Z794 Long term (current) use of insulin: Secondary | ICD-10-CM | POA: Diagnosis not present

## 2014-09-19 DIAGNOSIS — N3000 Acute cystitis without hematuria: Secondary | ICD-10-CM | POA: Diagnosis not present

## 2014-09-19 DIAGNOSIS — Z7902 Long term (current) use of antithrombotics/antiplatelets: Secondary | ICD-10-CM | POA: Insufficient documentation

## 2014-09-19 DIAGNOSIS — I2510 Atherosclerotic heart disease of native coronary artery without angina pectoris: Secondary | ICD-10-CM | POA: Diagnosis present

## 2014-09-19 DIAGNOSIS — E1165 Type 2 diabetes mellitus with hyperglycemia: Secondary | ICD-10-CM | POA: Diagnosis not present

## 2014-09-19 DIAGNOSIS — E876 Hypokalemia: Secondary | ICD-10-CM | POA: Insufficient documentation

## 2014-09-19 DIAGNOSIS — R748 Abnormal levels of other serum enzymes: Secondary | ICD-10-CM | POA: Diagnosis present

## 2014-09-19 DIAGNOSIS — N182 Chronic kidney disease, stage 2 (mild): Secondary | ICD-10-CM

## 2014-09-19 DIAGNOSIS — R001 Bradycardia, unspecified: Secondary | ICD-10-CM | POA: Diagnosis present

## 2014-09-19 DIAGNOSIS — M1990 Unspecified osteoarthritis, unspecified site: Secondary | ICD-10-CM | POA: Diagnosis not present

## 2014-09-19 DIAGNOSIS — E1122 Type 2 diabetes mellitus with diabetic chronic kidney disease: Secondary | ICD-10-CM | POA: Diagnosis present

## 2014-09-19 DIAGNOSIS — E860 Dehydration: Secondary | ICD-10-CM | POA: Diagnosis not present

## 2014-09-19 LAB — URINE MICROSCOPIC-ADD ON

## 2014-09-19 LAB — URINALYSIS, ROUTINE W REFLEX MICROSCOPIC
BILIRUBIN URINE: NEGATIVE
Glucose, UA: >1000 mg/dL — AB
Hgb urine dipstick: NEGATIVE
Ketones, ur: NEGATIVE mg/dL
LEUKOCYTES UA: NEGATIVE
NITRITE: NEGATIVE
PROTEIN: NEGATIVE mg/dL
SPECIFIC GRAVITY, URINE: 1.036 — AB (ref 1.005–1.030)
UROBILINOGEN UA: 0.2 mg/dL (ref 0.0–1.0)
pH: 6.5 (ref 5.0–8.0)

## 2014-09-19 LAB — BASIC METABOLIC PANEL
ANION GAP: 10 (ref 5–15)
BUN: 11 mg/dL (ref 6–23)
CHLORIDE: 100 mmol/L (ref 96–112)
CO2: 30 mmol/L (ref 19–32)
Calcium: 9.7 mg/dL (ref 8.4–10.5)
Creatinine, Ser: 1.01 mg/dL (ref 0.50–1.10)
GFR calc Af Amer: 67 mL/min — ABNORMAL LOW (ref >90)
GFR calc non Af Amer: 58 mL/min — ABNORMAL LOW (ref >90)
Glucose, Bld: 314 mg/dL — ABNORMAL HIGH (ref 70–99)
Potassium: 3.4 mmol/L — ABNORMAL LOW (ref 3.5–5.1)
Sodium: 140 mmol/L (ref 135–145)

## 2014-09-19 LAB — CBC
HEMATOCRIT: 36 % (ref 36.0–46.0)
HEMATOCRIT: 36 % (ref 36.0–46.0)
HEMOGLOBIN: 11.6 g/dL — AB (ref 12.0–15.0)
Hemoglobin: 11.7 g/dL — ABNORMAL LOW (ref 12.0–15.0)
MCH: 23.8 pg — ABNORMAL LOW (ref 26.0–34.0)
MCH: 23.5 pg — AB (ref 26.0–34.0)
MCHC: 32.2 g/dL (ref 30.0–36.0)
MCHC: 32.5 g/dL (ref 30.0–36.0)
MCV: 73.9 fL — AB (ref 78.0–100.0)
MCV: 72.4 fL — ABNORMAL LOW (ref 78.0–100.0)
PLATELETS: 225 10*3/uL (ref 150–400)
PLATELETS: 250 10*3/uL (ref 150–400)
RBC: 4.87 MIL/uL (ref 3.87–5.11)
RBC: 4.97 MIL/uL (ref 3.87–5.11)
RDW: 14.7 % (ref 11.5–15.5)
RDW: 14.6 % (ref 11.5–15.5)
WBC: 5 10*3/uL (ref 4.0–10.5)
WBC: 6.3 10*3/uL (ref 4.0–10.5)

## 2014-09-19 LAB — COMPREHENSIVE METABOLIC PANEL
ALK PHOS: 174 U/L — AB (ref 39–117)
ALT: 20 U/L (ref 0–35)
AST: 19 U/L (ref 0–37)
Albumin: 4 g/dL (ref 3.5–5.2)
Anion gap: 10 (ref 5–15)
BILIRUBIN TOTAL: 0.8 mg/dL (ref 0.3–1.2)
BUN: 15 mg/dL (ref 6–23)
CO2: 29 mmol/L (ref 19–32)
CREATININE: 1.18 mg/dL — AB (ref 0.50–1.10)
Calcium: 9.7 mg/dL (ref 8.4–10.5)
Chloride: 93 mmol/L — ABNORMAL LOW (ref 96–112)
GFR calc non Af Amer: 48 mL/min — ABNORMAL LOW (ref >90)
GFR, EST AFRICAN AMERICAN: 56 mL/min — AB (ref >90)
GLUCOSE: 774 mg/dL — AB (ref 70–99)
POTASSIUM: 4.4 mmol/L (ref 3.5–5.1)
Sodium: 132 mmol/L — ABNORMAL LOW (ref 135–145)
TOTAL PROTEIN: 7.4 g/dL (ref 6.0–8.3)

## 2014-09-19 LAB — CBG MONITORING, ED
Glucose-Capillary: >600 mg/dL (ref 70–99)
Glucose-Capillary: 582 mg/dL (ref 70–99)
Glucose-Capillary: 483 mg/dL — ABNORMAL HIGH (ref 70–99)

## 2014-09-19 LAB — CREATININE, SERUM
Creatinine, Ser: 1.01 mg/dL (ref 0.50–1.10)
GFR, EST AFRICAN AMERICAN: 67 mL/min — AB (ref >90)
GFR, EST NON AFRICAN AMERICAN: 58 mL/min — AB (ref >90)

## 2014-09-19 LAB — GLUCOSE, CAPILLARY
GLUCOSE-CAPILLARY: 156 mg/dL — AB (ref 70–99)
GLUCOSE-CAPILLARY: 138 mg/dL — AB (ref 70–99)
Glucose-Capillary: 435 mg/dL — ABNORMAL HIGH (ref 70–99)
Glucose-Capillary: 394 mg/dL — ABNORMAL HIGH (ref 70–99)
Glucose-Capillary: 348 mg/dL — ABNORMAL HIGH (ref 70–99)
Glucose-Capillary: 201 mg/dL — ABNORMAL HIGH (ref 70–99)
Glucose-Capillary: 127 mg/dL — ABNORMAL HIGH (ref 70–99)

## 2014-09-19 LAB — TSH: TSH: 0.467 u[IU]/mL (ref 0.350–4.500)

## 2014-09-19 LAB — TROPONIN I: Troponin I: <0.03 ng/mL (ref <0.031)

## 2014-09-19 MED ORDER — VITAMIN E 45 MG (100 UNIT) PO CAPS
200.0000 [IU] | ORAL_CAPSULE | Freq: Every day | ORAL | Status: DC
  Administered 2014-09-20 – 2014-09-21 (×2): 200 [IU] via ORAL
  Filled 2014-09-19 (×2): qty 2

## 2014-09-19 MED ORDER — ACETAMINOPHEN 325 MG PO TABS
650.0000 mg | ORAL_TABLET | Freq: Four times a day (QID) | ORAL | Status: DC | PRN
  Administered 2014-09-19: 650 mg via ORAL
  Filled 2014-09-19: qty 2

## 2014-09-19 MED ORDER — DEXTROSE 50 % IV SOLN: 25.0000 mL | INTRAVENOUS | Status: DC | PRN

## 2014-09-19 MED ORDER — SODIUM CHLORIDE 0.9 % IV SOLN
INTRAVENOUS | Status: DC
  Administered 2014-09-19: 18:00:00 via INTRAVENOUS
  Administered 2014-09-19: 3.8 [IU]/h via INTRAVENOUS
  Administered 2014-09-19: 8.6 [IU]/h via INTRAVENOUS
  Filled 2014-09-19: qty 2.5

## 2014-09-19 MED ORDER — METOPROLOL TARTRATE 25 MG PO TABS
25.0000 mg | ORAL_TABLET | Freq: Two times a day (BID) | ORAL | Status: DC
Start: 2014-09-19 — End: 2014-09-21
  Administered 2014-09-20 – 2014-09-21 (×3): 25 mg via ORAL
  Filled 2014-09-19 (×4): qty 1

## 2014-09-19 MED ORDER — AMLODIPINE BESYLATE 5 MG PO TABS
5.0000 mg | ORAL_TABLET | Freq: Every day | ORAL | Status: DC
  Administered 2014-09-20: 5 mg via ORAL
  Filled 2014-09-19: qty 1

## 2014-09-19 MED ORDER — HEPARIN SODIUM (PORCINE) 5000 UNIT/ML IJ SOLN
5000.0000 [IU] | Freq: Three times a day (TID) | INTRAMUSCULAR | Status: DC
  Filled 2014-09-19 (×2): qty 1

## 2014-09-19 MED ORDER — ROSUVASTATIN CALCIUM 10 MG PO TABS
10.0000 mg | ORAL_TABLET | Freq: Every day | ORAL | Status: DC
  Administered 2014-09-20: 10 mg via ORAL
  Filled 2014-09-19 (×2): qty 1

## 2014-09-19 MED ORDER — SODIUM CHLORIDE 0.9 % IJ SOLN
3.0000 mL | Freq: Two times a day (BID) | INTRAMUSCULAR | Status: DC
  Administered 2014-09-19 – 2014-09-20 (×3): 3 mL via INTRAVENOUS

## 2014-09-19 MED ORDER — INSULIN REGULAR BOLUS VIA INFUSION
0.0000 [IU] | Freq: Three times a day (TID) | INTRAVENOUS | Status: DC
  Filled 2014-09-19: qty 10

## 2014-09-19 MED ORDER — HYDRALAZINE HCL 20 MG/ML IJ SOLN: 10.0000 mg | Freq: Four times a day (QID) | INTRAMUSCULAR | Status: DC | PRN

## 2014-09-19 MED ORDER — DOCUSATE SODIUM 100 MG PO CAPS
100.0000 mg | ORAL_CAPSULE | Freq: Two times a day (BID) | ORAL | Status: DC
  Administered 2014-09-19 – 2014-09-21 (×4): 100 mg via ORAL
  Filled 2014-09-19 (×4): qty 1

## 2014-09-19 MED ORDER — CLOPIDOGREL BISULFATE 75 MG PO TABS
75.0000 mg | ORAL_TABLET | Freq: Every day | ORAL | Status: DC
Start: 2014-09-20 — End: 2014-09-21
  Filled 2014-09-19 (×2): qty 1

## 2014-09-19 MED ORDER — ONDANSETRON HCL 4 MG/2ML IJ SOLN
4.0000 mg | Freq: Three times a day (TID) | INTRAMUSCULAR | Status: AC | PRN
  Administered 2014-09-19: 4 mg via INTRAVENOUS
  Filled 2014-09-19: qty 2

## 2014-09-19 MED ORDER — GLUCOSAMINE-CHONDROITIN 500-400 MG PO TABS: 1.0000 | ORAL_TABLET | Freq: Every day | ORAL | Status: DC

## 2014-09-19 MED ORDER — SODIUM CHLORIDE 0.9 % IV SOLN
INTRAVENOUS | Status: DC
  Administered 2014-09-20: 01:00:00 via INTRAVENOUS

## 2014-09-19 MED ORDER — SODIUM CHLORIDE 0.9 % IV BOLUS (SEPSIS)
1000.0000 mL | Freq: Once | INTRAVENOUS | Status: AC
  Administered 2014-09-19: 1000 mL via INTRAVENOUS

## 2014-09-19 MED ORDER — VITAMIN C 500 MG PO TABS
500.0000 mg | ORAL_TABLET | Freq: Every day | ORAL | Status: DC
  Administered 2014-09-20 – 2014-09-21 (×2): 500 mg via ORAL
  Filled 2014-09-19 (×2): qty 1

## 2014-09-19 MED ORDER — SODIUM CHLORIDE 0.9 % IV SOLN: INTRAVENOUS | Status: DC

## 2014-09-19 MED ORDER — ZOLPIDEM TARTRATE 5 MG PO TABS
5.0000 mg | ORAL_TABLET | Freq: Every day | ORAL | Status: DC
Start: 2014-09-19 — End: 2014-09-19
  Filled 2014-09-19: qty 1

## 2014-09-19 MED ORDER — POLYETHYLENE GLYCOL 3350 17 G PO PACK
17.0000 g | PACK | Freq: Every day | ORAL | Status: DC
  Administered 2014-09-20 – 2014-09-21 (×2): 17 g via ORAL
  Filled 2014-09-19 (×2): qty 1

## 2014-09-19 MED ORDER — DEXTROSE-NACL 5-0.45 % IV SOLN
INTRAVENOUS | Status: DC
  Administered 2014-09-19: 20:00:00 via INTRAVENOUS

## 2014-09-19 MED ORDER — OMEGA-3-ACID ETHYL ESTERS 1 G PO CAPS
ORAL_CAPSULE | Freq: Every day | ORAL | Status: DC
  Administered 2014-09-20 – 2014-09-21 (×2): 1 g via ORAL
  Filled 2014-09-19 (×2): qty 1

## 2014-09-19 MED ORDER — ACETAMINOPHEN 650 MG RE SUPP: 650.0000 mg | Freq: Four times a day (QID) | RECTAL | Status: DC | PRN

## 2014-09-19 MED ORDER — DIAZEPAM 5 MG PO TABS
5.0000 mg | ORAL_TABLET | Freq: Two times a day (BID) | ORAL | Status: DC
  Administered 2014-09-20 – 2014-09-21 (×3): 5 mg via ORAL
  Filled 2014-09-19 (×4): qty 1

## 2014-09-19 MED ORDER — ASPIRIN EC 81 MG PO TBEC
81.0000 mg | DELAYED_RELEASE_TABLET | Freq: Every day | ORAL | Status: DC
  Administered 2014-09-20 – 2014-09-21 (×2): 81 mg via ORAL
  Filled 2014-09-19 (×2): qty 1

## 2014-09-19 NOTE — Progress Notes (Signed)
PT Cancellation Note  Patient Details Name: [name] [name] MRN: [id number] DOB: [birth year]   Cancelled Treatment:    Reason Eval/Treat Not Completed: Patient at procedure or test/unavailable Pt going off the floor for Xray. Will follow up next available time for PT evaluation.   Alvie HeidelbergFolan, Arron Mcnaught A 09/19/2014, 4:34 PM Alvie HeidelbergShauna Folan, PT, DPT [phone]

## 2014-09-19 NOTE — ED Notes (Signed)
CBG 483 

## 2014-09-19 NOTE — ED Notes (Signed)
Pt stated that she was up all night urinating and then checked her blood sugar today and it was greater than 600.

## 2014-09-19 NOTE — H&P (Signed)
[facility] History and Physical                                                                                    [name] [name], is a 64 y.o. female  MRN: [id number]   DOB - 05/21/1951  Admit Date - 09/19/2014  Outpatient Primary MD for the patient is [name] Aliment, MD  With History of -  Past Medical History  Diagnosis Date  . Coronary artery disease     DES to ostial RCA 2005  . Hypertension   . Diabetes mellitus without complication     No longer taking meds  . Osteoarthritis   . Dyslipidemia   . Anemia   . Carpal tunnel syndrome       Past Surgical History  Procedure Laterality Date  . Wrist surgery    . Tonsillectomy    . Appendectomy      in for   Chief Complaint  Patient presents with  . Hyperglycemia     HPI  [name] [name]  is a 64 y.o. 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 She has a past medical history significant for diabetes mellitus, coronary artery disease (status post stent placement), hypertension, and dyslipidemia. She presents to the ER today with extreme fatigue and is found to have a glucose of 774. [name] [name] states that starting last Saturday she has felt very weak and tired. She has had increased urination, and increased thirst. Her granddaughter attempted suicide and that has put a great deal of stress on her. She felt so weak and tired that she related that all day on Easter Sunday. She reports that her primary care stopped her metformin a few months ago stating that her blood work looked good and she no longer needed it.  In the emergency department her CBGs 774. Creatinine is slightly elevated at 1.18 compared to a baseline of 1.01. Her sodium is 132, chloride 93 these are likely reflective of her diuresis due to hyperglycemia. She is slightly bradycardic at 52 bpm. Blood pressure is low while I am in the room 81/66. She also has isolated elevation of her alkaline phosphatase. (174).  She will be admitted observation in  order to control her hyperglycemia and rule out infection.  Review of Systems   In addition to the HPI above,  No Fever-chills, No Headache, No changes with Vision or hearing, No problems swallowing food or Liquids, No Chest pain, Cough or Shortness of Breath, No Abdominal pain, No Nausea or Vomiting, Bowel movements are regular, No Blood in stool or Urine, No new skin rashes or bruises, No new joints pains-aches,  No new weakness, tingling, numbness in any extremity, No recent weight gain or loss, A full 10 point Review of Systems was done, except as stated above, all other Review of Systems were negative.  Social History History  Substance Use Topics  . Smoking status: Former Smoker    Types: Cigarettes  . Smokeless tobacco: Not on file     Comment: Stopped cigarettes one year.    . Alcohol Use: No    Family History Family History  Problem Relation Age of Onset  .  CAD Father 78  . CAD Mother 73  . CAD Brother     Prior to Admission medications   Medication Sig Start Date End Date Taking? Authorizing Provider  amLODipine (NORVASC) 5 MG tablet Take 1 tablet by mouth daily. 07/19/14  Yes Historical Provider, MD  aspirin 81 MG tablet Take 81 mg by mouth daily.   Yes Historical Provider, MD  chlorthalidone (HYGROTON) 25 MG tablet Take 12.5 mg by mouth daily.  11/12/13  Yes Historical Provider, MD  Cholecalciferol ([employer] VIT D 5000 HIGH-POTENCY PO) Take 1 tablet by mouth daily.    Yes Historical Provider, MD  clopidogrel (PLAVIX) 75 MG tablet Take 75 mg by mouth daily.   Yes Historical Provider, MD  diazepam (VALIUM) 5 MG tablet Take 1 tablet by mouth 2 (two) times daily. 06/29/14  Yes Historical Provider, MD  Eszopiclone 3 MG TABS Take 1 tablet by mouth at bedtime as needed (sleep).  07/19/14  Yes Historical Provider, MD  glucosamine-chondroitin 500-400 MG tablet Take 1 tablet by mouth daily.   Yes Historical Provider, MD  Iron Combinations (IRON COMPLEX PO) Take 65 mg by mouth.    Yes Historical Provider, MD  metoprolol (LOPRESSOR) 50 MG tablet Take 50 mg by mouth 2 (two) times daily.   Yes Historical Provider, MD  Multiple Vitamin (MULTIVITAMIN) capsule Take 1 capsule by mouth daily.   Yes Historical Provider, MD  naproxen (NAPROSYN) 500 MG tablet Take 1 tablet by mouth daily. 07/20/14  Yes Historical Provider, MD  olmesartan-hydrochlorothiazide (BENICAR HCT) 40-25 MG per tablet Take 1 tablet by mouth daily.   Yes Historical Provider, MD  Omega-3 Fatty Acids (OMEGA 3 PO) Take 1 tablet by mouth daily.   Yes Historical Provider, MD  rosuvastatin (CRESTOR) 10 MG tablet Take 10 mg by mouth daily.   Yes Historical Provider, MD  vitamin C (ASCORBIC ACID) 500 MG tablet Take 500 mg by mouth daily.   Yes Historical Provider, MD  vitamin E 200 UNIT capsule Take 200 Units by mouth daily.   Yes Historical Provider, MD    No Known Allergies  Physical Exam  Vitals  Blood pressure 140/95, pulse 62, temperature 97.5 F (36.4 C), temperature source Oral, resp. rate 18, SpO2 100 %.   General: Well-developed, well-nourished, pleasant female lying in bed in NAD, friend at bedside.  Psych:  Normal affect and insight, Not Suicidal or Homicidal, Awake Alert, Oriented X 3.  Neuro:   No F.N deficits, ALL C.Nerves Intact, Strength 5/5 all 4 extremities, Sensation intact all 4 extremities.  ENT:  Ears and Eyes appear Normal, Conjunctivae clear, PER. Moist oral mucosa without erythema or exudates.  Neck:  Supple, No lymphadenopathy appreciated  Respiratory:  Symmetrical chest wall movement, Good air movement bilaterally, CTAB.  Cardiac:  RRR, No Murmurs, no LE edema noted, no JVD.    Abdomen:  Positive bowel sounds, Soft, Non tender, Non distended,  No masses appreciated  Skin:  No Cyanosis, Normal Skin Turgor, No Skin Rash or Bruise.  Extremities:  Able to move all 4. 5/5 strength in each,  no effusions.  Data Review  CBC  Recent Labs Lab 09/19/14 1100  WBC 5.0  HGB  11.6*  HCT 36.0  PLT 225  MCV 73.9*  MCH 23.8*  MCHC 32.2  RDW 14.7    Chemistries   Recent Labs Lab 09/19/14 1100  NA 132*  K 4.4  CL 93*  CO2 29  GLUCOSE 774*  BUN 15  CREATININE 1.18*  CALCIUM 9.7  AST 19  ALT 20  ALKPHOS 174*  BILITOT 0.8     Urinalysis    Component Value Date/Time   COLORURINE YELLOW 09/19/2014 1055   APPEARANCEUR CLEAR 09/19/2014 1055   LABSPEC 1.036* 09/19/2014 1055   PHURINE 6.5 09/19/2014 1055   GLUCOSEU >1000* 09/19/2014 1055   HGBUR NEGATIVE 09/19/2014 1055   BILIRUBINUR NEGATIVE 09/19/2014 1055   KETONESUR NEGATIVE 09/19/2014 1055   PROTEINUR NEGATIVE 09/19/2014 1055   UROBILINOGEN 0.2 09/19/2014 1055   NITRITE NEGATIVE 09/19/2014 1055   LEUKOCYTESUR NEGATIVE 09/19/2014 1055    Imaging results:   Mm Digital Screening Bilateral  08/24/2014   CLINICAL DATA:  Screening.  EXAM: DIGITAL SCREENING BILATERAL MAMMOGRAM WITH CAD  COMPARISON:  Previous exam(s).  ACR Breast Density Category b: There are scattered areas of fibroglandular density.  FINDINGS: There are no findings suspicious for malignancy. Images were processed with CAD.  IMPRESSION: No mammographic evidence of malignancy. A result letter of this screening mammogram will be mailed directly to the patient.  RECOMMENDATION: Screening mammogram in one year. (Code:[id number]-01Y)  BI-RADS CATEGORY  1: Negative.   Electronically Signed   By: Britta MccreedySusan  Turner M.D.   On: 08/24/2014 14:38    My personal review of EKG: NSR, No ST changes noted.   Assessment & Plan  Principal Problem:   Hyperglycemia Active Problems:   Essential hypertension, benign   Coronary atherosclerosis   Bradycardia   Diabetes mellitus   Hyponatremia   Elevated alkaline phosphatase level  Hyperglycemia in the setting of type 2 diabetes.  I do not know why her glucose has become so elevated. Particularly given that her PCP thought it was well enough to DC her metformin. Urinary analysis is clear. Chest x-ray is  clear. She does not show signs of infection. We will admit her to a telemetry bed and place her on a glucose stabilizer. She will likely need insulin at discharge. I will request a diabetic coordinator consultation and insulin teaching. Hemoglobin A1c pending.  Dehydration with mild hyponatremia and hypochloremia Secondary to hyperglycemia. Will hold hydrochlorothiazide, chlorthalidone, ARB, naproxen. Patient receiving IV fluids.  Hypertension Will continue metoprolol at half dose due to bradycardia. We'll continue amlodipine. Holding HCTZ, chlorthalidone and Benicar. Patient may well benefit from olmesartan without the HCTZ combination. Please re-evaluate blood pressure medications prior to discharge.   Bradycardia EKG unrevealing. Will decrease dose of metoprolol and monitor.  history of coronary artery disease with stent placement Continue aspirin and Plavix.  Hyperlipidemia Continue statin   Elevated alkaline phosphatase Uncertain etiology. Have ordered fractionated alkaline phosphatase.  DVT Prophylaxis: heparin  AM Labs Ordered, also please review Full Orders  Family Communication:    none. Patient is alert and oriented   Code Status:   full   Condition:   guarded   Time spent in minutes : [address]   Kilyn Maragh York,  PA-C on 09/19/2014 at 2:11 PM  Between 7am to 7pm - Pager - [phone]  After 7pm go to [email or web address] - password [email or web address]  And look for the night coverage person covering me after hours  [facility]

## 2014-09-19 NOTE — Progress Notes (Signed)
Informed on-call about patient's low blood pressure.  79/46 Took B/P manuel and got 97/62. Informed on-call of current situation.  Held metoprolol.  Also held Ambien and Valem per doctors order.

## 2014-09-20 ENCOUNTER — Encounter (HOSPITAL_COMMUNITY): Payer: Self-pay | Admitting: General Practice

## 2014-09-20 DIAGNOSIS — R739 Hyperglycemia, unspecified: Secondary | ICD-10-CM | POA: Diagnosis not present

## 2014-09-20 DIAGNOSIS — E1165 Type 2 diabetes mellitus with hyperglycemia: Secondary | ICD-10-CM | POA: Diagnosis not present

## 2014-09-20 LAB — BASIC METABOLIC PANEL
Anion gap: 7 (ref 5–15)
BUN: 15 mg/dL (ref 6–23)
CALCIUM: 8.9 mg/dL (ref 8.4–10.5)
CHLORIDE: 99 mmol/L (ref 96–112)
CO2: 29 mmol/L (ref 19–32)
CREATININE: 1.04 mg/dL (ref 0.50–1.10)
GFR calc non Af Amer: 56 mL/min — ABNORMAL LOW (ref >90)
GFR, EST AFRICAN AMERICAN: 65 mL/min — AB (ref >90)
Glucose, Bld: 297 mg/dL — ABNORMAL HIGH (ref 70–99)
Potassium: 4 mmol/L (ref 3.5–5.1)
SODIUM: 135 mmol/L (ref 135–145)

## 2014-09-20 LAB — CBC
HCT: 32.9 % — ABNORMAL LOW (ref 36.0–46.0)
HEMOGLOBIN: 10.6 g/dL — AB (ref 12.0–15.0)
MCH: 23.7 pg — ABNORMAL LOW (ref 26.0–34.0)
MCHC: 32.2 g/dL (ref 30.0–36.0)
MCV: 73.6 fL — ABNORMAL LOW (ref 78.0–100.0)
Platelets: 199 10*3/uL (ref 150–400)
RBC: 4.47 MIL/uL (ref 3.87–5.11)
RDW: 14.7 % (ref 11.5–15.5)
WBC: 6.2 10*3/uL (ref 4.0–10.5)

## 2014-09-20 LAB — GLUCOSE, CAPILLARY
GLUCOSE-CAPILLARY: 269 mg/dL — AB (ref 70–99)
GLUCOSE-CAPILLARY: 241 mg/dL — AB (ref 70–99)
Glucose-Capillary: 123 mg/dL — ABNORMAL HIGH (ref 70–99)
Glucose-Capillary: 265 mg/dL — ABNORMAL HIGH (ref 70–99)
Glucose-Capillary: 293 mg/dL — ABNORMAL HIGH (ref 70–99)
Glucose-Capillary: 320 mg/dL — ABNORMAL HIGH (ref 70–99)

## 2014-09-20 LAB — VITAMIN B12: VITAMIN B 12: 1740 pg/mL — AB (ref 211–911)

## 2014-09-20 MED ORDER — LIVING WELL WITH DIABETES BOOK
Freq: Once | Status: AC
  Administered 2014-09-20: 16:00:00
  Filled 2014-09-20: qty 1

## 2014-09-20 MED ORDER — INSULIN STARTER KIT- PEN NEEDLES (ENGLISH)
1.0000 | Freq: Once | Status: AC
  Administered 2014-09-20: 1
  Filled 2014-09-20: qty 1

## 2014-09-20 MED ORDER — INSULIN ASPART 100 UNIT/ML ~~LOC~~ SOLN
0.0000 [IU] | Freq: Three times a day (TID) | SUBCUTANEOUS | Status: DC
  Administered 2014-09-20 (×2): 8 [IU] via SUBCUTANEOUS
  Administered 2014-09-20: 11 [IU] via SUBCUTANEOUS
  Administered 2014-09-21: 15 [IU] via SUBCUTANEOUS
  Administered 2014-09-21: 5 [IU] via SUBCUTANEOUS

## 2014-09-20 MED ORDER — INSULIN GLARGINE 100 UNIT/ML ~~LOC~~ SOLN
10.0000 [IU] | Freq: Every day | SUBCUTANEOUS | Status: DC
  Administered 2014-09-20 (×2): 10 [IU] via SUBCUTANEOUS
  Filled 2014-09-20 (×2): qty 0.1

## 2014-09-20 NOTE — Progress Notes (Signed)
[facility] PROGRESS NOTE  [name] [name] DOA: 09/19/2014 PCP: [name], MD  Assessment/Plan: Hyperglycemia in the setting of type 2 diabetes.  - f/u with hgb a1c - transitioned to long acting insulin regimen. Will tailor her sq regimen pending blood sugar values.  Dehydration with mild hyponatremia and hypochloremia Secondary to hyperglycemia. Will continued to hold hydrochlorothiazide, chlorthalidone, ARB, naproxen. Patient receiving IV fluids. - improving  Hypertension Will continue metoprolol at half dose due to bradycardia. Given soft blood pressures will plan on discontinuing amlodipine.  Bradycardia EKG unrevealing. Continued decrease dose of metoprolol and monitor.  history of coronary artery disease with stent placement Continue aspirin and Plavix.  Hyperlipidemia Continue statin   Elevated alkaline phosphatase Uncertain etiology. Have ordered fractionated alkaline phosphatase.  Code Status: full Family Communication: none at bedside  Disposition Plan: pending improvement in condition.   Consultants:  None  Procedures:  None  Antibiotics:  None  HPI/Subjective: Pt  Has no new complaints  Objective: Filed Vitals:   09/20/14 1430  BP: 106/68  Pulse: 69  Temp: 98.1 F (36.7 C)  Resp: 16    Intake/Output Summary (Last 24 hours) at 09/20/14 1749 Last data filed at 09/20/14 0500  Gross per 24 hour  Intake    615 ml  Output      0 ml  Net    615 ml   Filed Weights   09/20/14 0525  Weight: 85.866 kg (189 lb 4.8 oz)    Exam:   General:  Pt in nad, alert and awake  Cardiovascular: rrr, no mrg  Respiratory: cta bl, no wheezes  Abdomen: soft, NT, ND  Musculoskeletal: no cyanosis or clubbing   Data Reviewed: Basic Metabolic Panel:  Recent Labs Lab 09/19/14 1100 09/19/14 1600 09/19/14 1858 09/20/14 0422  NA 132*  --  140 135  K 4.4  --  3.4* 4.0  CL 93*  --  100 99  CO2 29  --  30 29   GLUCOSE 774*  --  314* 297*  BUN 15  --  11 15  CREATININE 1.18* 1.01 1.01 1.04  CALCIUM 9.7  --  9.7 8.9   Liver Function Tests:  Recent Labs Lab 09/19/14 1100  AST 19  ALT 20  ALKPHOS 174*  BILITOT 0.8  PROT 7.4  ALBUMIN 4.0   No results for input(s): LIPASE, AMYLASE in the last 168 hours. No results for input(s): AMMONIA in the last 168 hours. CBC:  Recent Labs Lab 09/19/14 1100 09/19/14 1600 09/20/14 0422  WBC 5.0 6.3 6.2  HGB 11.6* 11.7* 10.6*  HCT 36.0 36.0 32.9*  MCV 73.9* 72.4* 73.6*  PLT 225 250 199   Cardiac Enzymes:  Recent Labs Lab 09/19/14 1600  TROPONINI <0.03   BNP (last 3 results) No results for input(s): BNP in the last 8760 hours.  ProBNP (last 3 results) No results for input(s): PROBNP in the last 8760 hours.  CBG:  Recent Labs Lab 09/19/14 2301 09/20/14 0006 09/20/14 0425 09/20/14 0718 09/20/14 1247  GLUCAP 138* 123* 293* 269* 320*    No results found for this or any previous visit (from the past 240 hour(s)).   Studies: X-ray Chest Pa And Lateral  09/19/2014   CLINICAL DATA:  Fatigue, hypertension, hyperglycemia  EXAM: CHEST  2 VIEW  COMPARISON:  08/03/2014  FINDINGS: The heart size and mediastinal contours are within normal limits. Both lungs are clear. The visualized skeletal structures are unremarkable.  IMPRESSION: No active cardiopulmonary disease.  Electronically Signed   By: Charlett NoseKevin  Dover M.D.   On: 09/19/2014 17:09    Scheduled Meds: . amLODipine  5 mg Oral Daily  . aspirin EC  81 mg Oral Daily  . clopidogrel  75 mg Oral Daily  . diazepam  5 mg Oral BID  . docusate sodium  100 mg Oral BID  . heparin  5,000 Units Subcutaneous 3 times per day  . insulin aspart  0-15 Units Subcutaneous TID WC  . insulin glargine  10 Units Subcutaneous QHS  . metoprolol  25 mg Oral BID  . omega-3 acid ethyl esters   Oral Daily  . polyethylene glycol  17 g Oral Daily  . rosuvastatin  10 mg Oral Daily  . sodium chloride  3 mL  Intravenous Q12H  . vitamin C  500 mg Oral Daily  . vitamin E  200 Units Oral Daily   Continuous Infusions:   Principal Problem:   Hyperglycemia Active Problems:   Essential hypertension, benign   Coronary atherosclerosis   Bradycardia   Diabetes mellitus   Hyponatremia   Elevated alkaline phosphatase level   Acute kidney injury    Time spent: > 35 minutes    [name], [name]  [facility] Pager [phone] If 7PM-7AM, please contact night-coverage at [email or web address], password [email or web address] 09/20/2014, 5:49 PM

## 2014-09-20 NOTE — Progress Notes (Signed)
Informed on-call that blood sugar was in target range four times while on glucose stabilizer.  On-call put new orders in.  Started new orders.

## 2014-09-20 NOTE — Progress Notes (Addendum)
Inpatient Diabetes Program Recommendations  AACE/ADA: New Consensus Statement on Inpatient Glycemic Control (2013)  Target Ranges:  Prepandial:   less than 140 mg/dL      Peak postprandial:   less than 180 mg/dL (1-2 hours)      Critically ill patients:  140 - 180 mg/dL   Results for [name], [name] (MRN [id number]) as of 09/20/2014 10:32  Ref. Range 09/19/2014 22:07 09/19/2014 23:01 09/20/2014 00:06 09/20/2014 04:25 09/20/2014 07:18  Glucose-Capillary Latest Range: 70-99 mg/dL 127 (H) 138 (H) 123 (H) 293 (H) 269 (H)   Diabetes history: DM2 Outpatient Diabetes medications: none Current orders for Inpatient glycemic control: Lantus 10 units QHS, Novolog 0-15 units TID with meals  Inpatient Diabetes Program Recommendations Insulin - Basal: Please increase Lantus to 16 units QHS (based on 88 kg x 0.2 units). Correction (SSI): Please order Novolog bedtime correction scale. HgbA1C: A1C in process. Note patient will likely need insulin at discharge. Plan to see patient today.  Note: Initial glucose was 774 mg/dl on 09/19/14 and patient was started on IV insulin and has transitioned to SQ insulin. Patient received Lantus 10 units on 09/20/14 at 1:17 am. Noted in the chart that patient has a history of DM2 but is no longer on any DM medications as an outpatient. Will talk with patient today regarding diabetes and insulin teaching.  09/20/14@11 :50-Spoke with patient about diabetes and home regimen for diabetes control. Patient reports that she is followed by her PCP for diabetes management and currently she was not taking any medications for diabetes as an outpatient for diabetes control. Patient reports that she was taking Janumet 50-1000 mg BID and her PCP took her off the medication in October or November of 2015 since her A1C was down to 6%. Patient reports that she did not check her glucose routinely and when she did check her glucose was between 100-200's mg/dl. According to the patient she saw her PCP on  09/13/14 due to glucose being elevated. Patient states that her PCP was going to have her restart the Janumet but patient wanted to try something else because she kept diarrhea when she was taking the Janumet. Therefore, her PCP talked with her about going on a once a week injection but patient is not sure of name of medication (likely Budureon or Trulicity). Patient states that her PCP told her she would wait on the results of the blood work before she decided whether to start her on the injection.  She began felling bad over the past week and she decided to check her glucose and it was HI on her glucometer at home so she came to the hospital. Discussed basic pathophysiology of DM Type 2, basic home care, importance of checking CBGs and maintaining good CBG control to prevent long-term and short-term complications. Discussed impact of nutrition, exercise, stress, sickness, and medications on diabetes control. Patient reports that she has been under a lot of stress over the past few days (her granddaughter tried to commit suicide and is currently in the hospital and her daughter has been having seizures).  Patient states that she does a lot in her community to help others and she has not been taking time to take care of herself. Stressed importance of getting diabetes controlled and maintaining glycemic control. Discussed Lantus and Novolog insulin (onset and duration). Patient states that her mother use to be on insulin and she use to give her insulin shots (vial/syringe). Discussed insulin pens and insulin vial/syringe. Patient states that she prefers to  use the insulin pens. Educated patient on insulin pen use at home. Reviewed contents of insulin flexpen starter kit. Reviewed all steps of insulin pen including attachment of needle, 2-unit air shot, dialing up dose, giving injection, removing needle, disposal of sharps, storage of unused insulin, disposal of insulin etc. Patient able to provide successful return  demonstration. Have asked patient to check her glucose 3-4 times per day and to take her glucometer or written glucose results with her to her follow up appointment so her PCP can continue to make adjustments with diabetes medications if needed. Patient verbalized understanding of information discussed and she states that she has no further questions at this time related to diabetes.    MD to give patient Rxs for insulin pens and insulin pen needles at time of discharge.  Thanks, Barnie Alderman, RN, MSN, CCRN, CDE Diabetes Coordinator Inpatient Diabetes Program [phone] (Team Pager from [place] to [place]) [phone] ([facility] office) [phone] [facility] office)

## 2014-09-20 NOTE — Progress Notes (Signed)
UR completed 

## 2014-09-20 NOTE — Evaluation (Signed)
Physical Therapy Evaluation Patient Details Name: [name] [name] MRN: [id number] DOB: [birth year] Today's Date: 09/20/2014   History of Present Illness  Patient is a 64 y/o female with PMH of diabetes mellitus, coronary artery disease (status post stent placement), hypertension, and dyslipidemia who presents to the ER with extreme fatigue and is found to have a glucose of 774.   Clinical Impression  Patient presents close to functional baseline and able to tolerate ambulating community distances while performing higher level balance challenges without difficulty or LOB. Pt very active and independent PTA and does an exercise class weekly and walks daily. Pt does not require skilled therapy services. Encourage daily ambulation while in hospital. Discharge from therapy.    Follow Up Recommendations No PT follow up;Supervision - Intermittent    Equipment Recommendations  None recommended by PT    Recommendations for Other Services       Precautions / Restrictions Precautions Precautions: None Restrictions Weight Bearing Restrictions: No      Mobility  Bed Mobility Overal bed mobility: Modified Independent                Transfers Overall transfer level: Independent                  Ambulation/Gait Ambulation/Gait assistance: Independent Ambulation Distance (Feet): 510 Feet Assistive device: None Gait Pattern/deviations: Step-through pattern;Decreased stride length   Gait velocity interpretation: at or above normal speed for age/gender General Gait Details: Pt with steady gait; tolerated higher level balance challenges without difficulty. See balance section.  Stairs            Wheelchair Mobility    Modified Rankin (Stroke Patients Only)       Balance Overall balance assessment: Needs assistance Sitting-balance support: Feet supported;No upper extremity supported Sitting balance-Leahy Scale: Good     Standing balance support: During functional  activity Standing balance-Leahy Scale: Good               High level balance activites: Backward walking;Direction changes;Turns;Sudden stops;Head turns High Level Balance Comments: Tolerated above activities without LOB or deviations in gait.              Pertinent Vitals/Pain Pain Assessment: No/denies pain    Home Living Family/patient expects to be discharged to:: Private residence Living Arrangements: Alone Available Help at Discharge: Family;Available PRN/intermittently Type of Home: Apartment Home Access: Stairs to enter Entrance Stairs-Rails: Right Entrance Stairs-Number of Steps: 12 or stair lift Home Layout: One level Home Equipment: None      Prior Function Level of Independence: Independent               Hand Dominance        Extremity/Trunk Assessment   Upper Extremity Assessment: Defer to OT evaluation;Overall WFL for tasks assessed           Lower Extremity Assessment: Overall WFL for tasks assessed         Communication   Communication: No difficulties  Cognition Arousal/Alertness: Awake/alert Behavior During Therapy: WFL for tasks assessed/performed Overall Cognitive Status: Within Functional Limits for tasks assessed                      General Comments      Exercises        Assessment/Plan    PT Assessment Patent does not need any further PT services  PT Diagnosis Difficulty walking   PT Problem List    PT Treatment Interventions     PT  Goals (Current goals can be found in the Care Plan section) Acute Rehab PT Goals PT Goal Formulation: All assessment and education complete, DC therapy    Frequency     Barriers to discharge        Co-evaluation               End of Session Equipment Utilized During Treatment: Gait belt Activity Tolerance: Patient tolerated treatment well Patient left: in chair;with call bell/phone within reach      Functional Assessment Tool Used: Clinical  judgment Functional Limitation: Mobility: Walking and moving around Mobility: Walking and Moving Around Current Status (Z6109): At least 1 percent but less than 20 percent impaired, limited or restricted Mobility: Walking and Moving Around Goal Status [phone]): 0 percent impaired, limited or restricted Mobility: Walking and Moving Around Discharge Status [phone]): 0 percent impaired, limited or restricted    Time: 1355-1407 PT Time Calculation (min) (ACUTE ONLY): 12 min   Charges:   PT Evaluation $Initial PT Evaluation Tier I: 1 Procedure     PT G Codes:   PT G-Codes **NOT FOR INPATIENT CLASS** Functional Assessment Tool Used: Clinical judgment Functional Limitation: Mobility: Walking and moving around Mobility: Walking and Moving Around Current Status (B1478): At least 1 percent but less than 20 percent impaired, limited or restricted Mobility: Walking and Moving Around Goal Status [phone]): 0 percent impaired, limited or restricted Mobility: Walking and Moving Around Discharge Status [phone]): 0 percent impaired, limited or restricted    Alvie Heidelberg A 09/20/2014, 2:17 PM Alvie Heidelberg, PT, DPT [phone]

## 2014-09-21 DIAGNOSIS — N3000 Acute cystitis without hematuria: Secondary | ICD-10-CM | POA: Diagnosis not present

## 2014-09-21 DIAGNOSIS — Z794 Long term (current) use of insulin: Secondary | ICD-10-CM

## 2014-09-21 DIAGNOSIS — E1122 Type 2 diabetes mellitus with diabetic chronic kidney disease: Secondary | ICD-10-CM | POA: Diagnosis present

## 2014-09-21 DIAGNOSIS — E1165 Type 2 diabetes mellitus with hyperglycemia: Secondary | ICD-10-CM | POA: Diagnosis not present

## 2014-09-21 DIAGNOSIS — R001 Bradycardia, unspecified: Secondary | ICD-10-CM | POA: Diagnosis not present

## 2014-09-21 DIAGNOSIS — N182 Chronic kidney disease, stage 2 (mild): Secondary | ICD-10-CM

## 2014-09-21 DIAGNOSIS — N179 Acute kidney failure, unspecified: Secondary | ICD-10-CM | POA: Diagnosis not present

## 2014-09-21 LAB — GLUCOSE, CAPILLARY
GLUCOSE-CAPILLARY: 357 mg/dL — AB (ref 70–99)
Glucose-Capillary: 205 mg/dL — ABNORMAL HIGH (ref 70–99)
Glucose-Capillary: 261 mg/dL — ABNORMAL HIGH (ref 70–99)

## 2014-09-21 LAB — HEMOGLOBIN A1C
HEMOGLOBIN A1C: 10.6 % — AB (ref 4.8–5.6)
Mean Plasma Glucose: 258 mg/dL

## 2014-09-21 LAB — URINE MICROSCOPIC-ADD ON

## 2014-09-21 LAB — URINALYSIS, ROUTINE W REFLEX MICROSCOPIC
BILIRUBIN URINE: NEGATIVE
Glucose, UA: >1000 mg/dL — AB
HGB URINE DIPSTICK: NEGATIVE
Ketones, ur: NEGATIVE mg/dL
Leukocytes, UA: NEGATIVE
NITRITE: NEGATIVE
PH: 7.5 (ref 5.0–8.0)
Protein, ur: NEGATIVE mg/dL
SPECIFIC GRAVITY, URINE: 1.022 (ref 1.005–1.030)
Urobilinogen, UA: 1 mg/dL (ref 0.0–1.0)

## 2014-09-21 MED ORDER — GLIMEPIRIDE 2 MG PO TABS
2.0000 mg | ORAL_TABLET | Freq: Every day | ORAL | Status: DC
  Filled 2014-09-21: qty 1

## 2014-09-21 MED ORDER — METOPROLOL TARTRATE 50 MG PO TABS: 25.0000 mg | ORAL_TABLET | Freq: Two times a day (BID) | ORAL | Status: DC

## 2014-09-21 MED ORDER — INSULIN GLARGINE 100 UNIT/ML SOLOSTAR PEN: 20.0000 [IU] | PEN_INJECTOR | Freq: Every day | SUBCUTANEOUS | Status: DC

## 2014-09-21 MED ORDER — METFORMIN HCL 500 MG PO TABS: 500.0000 mg | ORAL_TABLET | Freq: Two times a day (BID) | ORAL | Status: DC

## 2014-09-21 MED ORDER — GLIMEPIRIDE 2 MG PO TABS: 2.0000 mg | ORAL_TABLET | Freq: Every day | ORAL | Status: DC

## 2014-09-21 MED ORDER — INSULIN GLARGINE 100 UNIT/ML SOLOSTAR PEN: 30.0000 [IU] | PEN_INJECTOR | Freq: Every day | SUBCUTANEOUS | Status: DC

## 2014-09-21 MED ORDER — INSULIN GLARGINE 100 UNIT/ML ~~LOC~~ SOLN
20.0000 [IU] | Freq: Every day | SUBCUTANEOUS | Status: DC
  Filled 2014-09-21: qty 0.2

## 2014-09-21 MED ORDER — "INSULIN SYRINGE 29G X 1/2"" 0.5 ML MISC": 1.0000 "application " | Freq: Three times a day (TID) | Status: DC

## 2014-09-21 MED ORDER — INSULIN ASPART 100 UNIT/ML ~~LOC~~ SOLN: SUBCUTANEOUS | Status: DC

## 2014-09-21 MED ORDER — CIPROFLOXACIN HCL 500 MG PO TABS: 500.0000 mg | ORAL_TABLET | Freq: Two times a day (BID) | ORAL | Status: AC

## 2014-09-21 NOTE — Progress Notes (Addendum)
Patient had just finished watching Diabetic Teaching videos 502-508 except 505 and will continue to view videos [phone] tomorrow.

## 2014-09-21 NOTE — Progress Notes (Signed)
Pt verbally understood DC instructions, no questions asked 

## 2014-09-21 NOTE — Discharge Instructions (Signed)

## 2014-09-21 NOTE — Progress Notes (Signed)
Inpatient Diabetes Program Recommendations  AACE/ADA: New Consensus Statement on Inpatient Glycemic Control (2013)  Target Ranges:  Prepandial:   less than 140 mg/dL      Peak postprandial:   less than 180 mg/dL (1-2 hours)      Critically ill patients:  140 - 180 mg/dL   Results for [name] [name], [name] [name] (MRN [id number]) as of 09/21/2014 09:58  Ref. Range 09/20/2014 07:18 09/20/2014 12:47 09/20/2014 17:09 09/20/2014 21:29 09/21/2014 07:41  Glucose-Capillary Latest Range: 70-99 mg/dL 409269 (H) 811320 (H) 914265 (H) 241 (H) 205 (H)  Results for [name] [name], [name] [name] (MRN [id number]) as of 09/21/2014 09:58  Ref. Range 09/19/2014 18:58  Hemoglobin A1C Latest Range: 4.8-5.6 % 10.6 (H)   Diabetes history: DM2 Outpatient Diabetes medications: none Current orders for Inpatient glycemic control: Lantus 20 units QHS, Novolog 0-15 units TID with meals  Inpatient Diabetes Program Recommendations Insulin - Basal: Noted Lantus was increased from 10 units to 20 units QHS this morning. Correction (SSI): Please order Novolog bedtime correction scale. HgbA1C: A1C 10.6% on 09/19/14.  Thanks, Orlando PennerMarie Mishal Probert, RN, MSN, CCRN, CDE Diabetes Coordinator Inpatient Diabetes Program [phone] (Team Pager from 8am to 5pm) [phone] ([facility] office) [phone] [facility] office)

## 2014-09-21 NOTE — Discharge Summary (Signed)
Physician Discharge Summary  [name] [name] WUJ:[id number] DOB: 06-15-1951 DOA: 09/19/2014  PCP: [name], MD  Admit date: 09/19/2014 Discharge date: 09/21/2014  Time spent: 30  minutes  Recommendations for Outpatient Follow-up:  1. Discharged home in home health RN 2. Follow-up with PCP in 1 week.  Discharge Diagnoses:  Principal Problem:   Uncontrolled diabetes mellitus type 2 without complications  Active Problems:   Hyperglycemia   Essential hypertension, benign   Coronary atherosclerosis   Bradycardia   Hyponatremia   Elevated alkaline phosphatase level   Acute kidney injury   Acute cystitis   Discharge Condition: Fair  Diet recommendation: Diabetic  Filed Weights   09/20/14 0525 09/20/14 1925 09/21/14 0500  Weight: 85.866 kg (189 lb 4.8 oz) 88.27 kg (194 lb 9.6 oz) 87.091 kg (192 lb)    History of present illness:  Please refer to admission H&P for details, in brief, 64 year old female with history of diabetes mellitus, coronary artery disease status post stent placement, hypertension, lipidemia presented to the ED with fatigue, polyuria and polydipsia at home for past few days prior to admission. She also reports a lot of stress at home. As per H&P primary care stop her metformin a few months ago stating her blood work to GERD and she did not need it any further. However in the ED her CBGs were in 774 without any anion gap on chemistry.. Creatinine was mildly elevated and was mildly hyponatremic as well. A blood pressure was low as well at 81/67 mmHg. Patient admitted under observation and started on insulin drip on admission.  Hospital Course:  Hyperglycemia secondary to uncontrolled type 2 diabetes mellitus pt placed on insulin drip on admission and blood glucose started to improve. Hemoglobin A1c of 10.6. Patient started on Lantus 20 units at bedtime with sliding scale coverage. She refused to resume metformin as it causes persistent diarrhea. Have started  her on Amaryl. Her fsg are better but noted to be elevated from 200-350s. I will increase her Lantus to 30 units upon discharge with sliding scale coverage. (Instructions provided). Arrange for home health RN to provide monitoring of her blood glucose control and education on her disease process. UA is negative for ketones. She reports having a glucometer and other diabetic applies at home.  Dehydration with mild hyponatremia  Secondary to hyperglycemia and polyuria. Blood pressure medications held on admission have been resumed. Improved with IV hydration.  Essential Hypertension Resume home blood pressure medications. Metoprolol dose had been reduced due to sinus bradycardia with heart rate in low 50s.  History of CAD status post stent Continue aspirin and Plavix  Hyperlipidemia Continue statin  UTI Patient complains of burning urination this morning. UA showing few WBC and rare bacteria. Will treat with a 5 day course of oral ciprofloxacin empirically.  Iron deficiency anemia Continue supplement.  Hypokalemia Replenished  Procedures:  None  Consultations:  None  Discharge Exam: Filed Vitals:   09/21/14 0613  BP: 105/67  Pulse: 64  Temp: 98.7 F (37.1 C)  Resp: 16    General: Elderly female in no acute distress HEENT: No pallor, moist oral mucosa, no icterus, neck supple Chest: Clear to auscultation bilaterally, no added sounds CVS: Normal S1 and S2, no murmurs rub or gallop GI: Soft, nondistended, nontender, bowel sounds present Musculoskeletal: Warm, no edema CNS: Alert and oriented   Discharge Instructions    Current Discharge Medication List    START taking these medications   Details  ciprofloxacin (CIPRO) 500 MG tablet Take  1 tablet (500 mg total) by mouth 2 (two) times daily. Qty: 10 tablet, Refills: 0    glimepiride (AMARYL) 2 MG tablet Take 1 tablet (2 mg total) by mouth daily with breakfast. Qty: 30 tablet, Refills: 0    insulin aspart  (NOVOLOG) 100 UNIT/ML injection Blood glucose 0-120, no insulin 120-150, inject 2 units 150-200, inject 4 units 200-250, inject 6 units 250-300, inject 10 units  300-350, inject 12 units 350-400, inject 16 units >400, please call your PCP or go to urgent care during off hours or come to the ED. Qty: 10 mL, Refills: 5    Insulin Glargine (LANTUS SOLOSTAR) 100 UNIT/ML Solostar Pen Inject 30 Units into the skin daily at 10 pm. Qty: 15 mL, Refills: 5    INSULIN SYRINGE .5CC/29G 29G X 1/2" 0.5 ML MISC 1 application by Does not apply route 3 (three) times daily before meals. Qty: 100 each, Refills: 0      CONTINUE these medications which have CHANGED   Details  metoprolol (LOPRESSOR) 50 MG tablet Take 0.5 tablets (25 mg total) by mouth 2 (two) times daily. Qty: 30 tablet, Refills: 0      CONTINUE these medications which have NOT CHANGED   Details  amLODipine (NORVASC) 5 MG tablet Take 1 tablet by mouth daily. Refills: 5    aspirin 81 MG tablet Take 81 mg by mouth daily.    chlorthalidone (HYGROTON) 25 MG tablet Take 12.5 mg by mouth daily.     Cholecalciferol ([employer]S VIT D 5000 HIGH-POTENCY PO) Take 1 tablet by mouth daily.     clopidogrel (PLAVIX) 75 MG tablet Take 75 mg by mouth daily.    diazepam (VALIUM) 5 MG tablet Take 1 tablet by mouth 2 (two) times daily. Refills: 5    Eszopiclone 3 MG TABS Take 1 tablet by mouth at bedtime as needed (sleep).  Refills: 1    glucosamine-chondroitin 500-400 MG tablet Take 1 tablet by mouth daily.    Iron Combinations (IRON COMPLEX PO) Take 65 mg by mouth.    Multiple Vitamin (MULTIVITAMIN) capsule Take 1 capsule by mouth daily.    olmesartan-hydrochlorothiazide (BENICAR HCT) 40-25 MG per tablet Take 1 tablet by mouth daily.    Omega-3 Fatty Acids (OMEGA 3 PO) Take 1 tablet by mouth daily.    rosuvastatin (CRESTOR) 10 MG tablet Take 10 mg by mouth daily.    vitamin C (ASCORBIC ACID) 500 MG tablet Take 500 mg by mouth daily.     vitamin E 200 UNIT capsule Take 200 Units by mouth daily.      STOP taking these medications     naproxen (NAPROSYN) 500 MG tablet        No Known Allergies Follow-up Information    Follow up with SANDERS,ROBYN N, MD. Schedule an appointment as soon as possible for a visit in 1 week.   Specialty:  Internal Medicine   Contact information:   [address] [place] [address] [phone]        The results of significant diagnostics from this hospitalization (including imaging, microbiology, ancillary and laboratory) are listed below for reference.    Significant Diagnostic Studies: X-ray Chest Pa And Lateral  09/19/2014   CLINICAL DATA:  Fatigue, hypertension, hyperglycemia  EXAM: CHEST  2 VIEW  COMPARISON:  08/03/2014  FINDINGS: The heart size and mediastinal contours are within normal limits. Both lungs are clear. The visualized skeletal structures are unremarkable.  IMPRESSION: No active cardiopulmonary disease.   Electronically Signed  By: Charlett NoseKevin  Dover M.D.   On: 09/19/2014 17:09   Mm Digital Screening Bilateral  08/24/2014   CLINICAL DATA:  Screening.  EXAM: DIGITAL SCREENING BILATERAL MAMMOGRAM WITH CAD  COMPARISON:  Previous exam(s).  ACR Breast Density Category b: There are scattered areas of fibroglandular density.  FINDINGS: There are no findings suspicious for malignancy. Images were processed with CAD.  IMPRESSION: No mammographic evidence of malignancy. A result letter of this screening mammogram will be mailed directly to the patient.  RECOMMENDATION: Screening mammogram in one year. (Code:[id number]-B-01Y)  BI-RADS CATEGORY  1: Negative.   Electronically Signed   By: Britta MccreedySusan  Turner M.D.   On: 08/24/2014 14:38    Microbiology: No results found for this or any previous visit (from the past 240 hour(s)).   Labs: Basic Metabolic Panel:  Recent Labs Lab 09/19/14 1100 09/19/14 1600 09/19/14 1858 09/20/14 0422  NA 132*  --  140 135  K 4.4  --  3.4* 4.0  CL  93*  --  100 99  CO2 29  --  30 29  GLUCOSE 774*  --  314* 297*  BUN 15  --  11 15  CREATININE 1.18* 1.01 1.01 1.04  CALCIUM 9.7  --  9.7 8.9   Liver Function Tests:  Recent Labs Lab 09/19/14 1100  AST 19  ALT 20  ALKPHOS 174*  BILITOT 0.8  PROT 7.4  ALBUMIN 4.0   No results for input(s): LIPASE, AMYLASE in the last 168 hours. No results for input(s): AMMONIA in the last 168 hours. CBC:  Recent Labs Lab 09/19/14 1100 09/19/14 1600 09/20/14 0422  WBC 5.0 6.3 6.2  HGB 11.6* 11.7* 10.6*  HCT 36.0 36.0 32.9*  MCV 73.9* 72.4* 73.6*  PLT 225 250 199   Cardiac Enzymes:  Recent Labs Lab 09/19/14 1600  TROPONINI <0.03   BNP: BNP (last 3 results) No results for input(s): BNP in the last 8760 hours.  ProBNP (last 3 results) No results for input(s): PROBNP in the last 8760 hours.  CBG:  Recent Labs Lab 09/20/14 1247 09/20/14 1709 09/20/14 2129 09/21/14 0741 09/21/14 1206  GLUCAP 320* 265* 241* 205* 357*       Signed:  Tanyah Debruyne  [facility] 09/21/2014, 2:30 PM

## 2014-09-25 LAB — ALKALINE PHOSPHATASE ISOENZYMES
Alkaline phosphatase (APISO): 105 U/L (ref 33–130)
Bone %: 47 % (ref 28–66)
INTESTINAL %: 22 % (ref 1–24)
LIVER %: 31 % (ref 25–69)
Macrohepatic isoenzymes: 0 %

## 2014-09-28 NOTE — ED Provider Notes (Signed)
CSN: [id number]     Arrival date & time 09/19/14  1034 History   First MD Initiated Contact with Patient 09/19/14 1045     Chief Complaint  Patient presents with  . Hyperglycemia      HPI Patient presents with urination and elevated blood sugar at home. Past Medical History  Diagnosis Date  . Coronary artery disease     DES to ostial RCA 2005  . Hypertension   . Diabetes mellitus without complication     No longer taking meds  . Osteoarthritis   . Dyslipidemia   . Anemia   . Carpal tunnel syndrome    Past Surgical History  Procedure Laterality Date  . Wrist surgery    . Tonsillectomy    . Appendectomy     Family History  Problem Relation Age of Onset  . CAD Father 36  . CAD Mother 50  . CAD Brother    History  Substance Use Topics  . Smoking status: Former Smoker    Types: Cigarettes    Quit date: 12/20/2013  . Smokeless tobacco: Never Used     Comment: Stopped cigarettes one year.    . Alcohol Use: No   OB History    No data available     Review of Systems  All other systems reviewed and are negative  Allergies  Review of patient's allergies indicates no known allergies.  Home Medications   Prior to Admission medications   Medication Sig Start Date End Date Taking? Authorizing Provider  amLODipine (NORVASC) 5 MG tablet Take 1 tablet by mouth daily. 07/19/14  Yes Historical Provider, MD  aspirin 81 MG tablet Take 81 mg by mouth daily.   Yes Historical Provider, MD  chlorthalidone (HYGROTON) 25 MG tablet Take 12.5 mg by mouth daily.  11/12/13  Yes Historical Provider, MD  Cholecalciferol (CVS VIT D 5000 HIGH-POTENCY PO) Take 1 tablet by mouth daily.    Yes Historical Provider, MD  clopidogrel (PLAVIX) 75 MG tablet Take 75 mg by mouth daily.   Yes Historical Provider, MD  diazepam (VALIUM) 5 MG tablet Take 1 tablet by mouth 2 (two) times daily. 06/29/14  Yes Historical Provider, MD  Eszopiclone 3 MG TABS Take 1 tablet by mouth at bedtime as needed (sleep).   07/19/14  Yes Historical Provider, MD  glucosamine-chondroitin 500-400 MG tablet Take 1 tablet by mouth daily.   Yes Historical Provider, MD  Iron Combinations (IRON COMPLEX PO) Take 65 mg by mouth.   Yes Historical Provider, MD  Multiple Vitamin (MULTIVITAMIN) capsule Take 1 capsule by mouth daily.   Yes Historical Provider, MD  olmesartan-hydrochlorothiazide (BENICAR HCT) 40-25 MG per tablet Take 1 tablet by mouth daily.   Yes Historical Provider, MD  Omega-3 Fatty Acids (OMEGA 3 PO) Take 1 tablet by mouth daily.   Yes Historical Provider, MD  rosuvastatin (CRESTOR) 10 MG tablet Take 10 mg by mouth daily.   Yes Historical Provider, MD  vitamin C (ASCORBIC ACID) 500 MG tablet Take 500 mg by mouth daily.   Yes Historical Provider, MD  vitamin E 200 UNIT capsule Take 200 Units by mouth daily.   Yes Historical Provider, MD  glimepiride (AMARYL) 2 MG tablet Take 1 tablet (2 mg total) by mouth daily with breakfast. 09/22/14   Nishant Dhungel, MD  insulin aspart (NOVOLOG) 100 UNIT/ML injection Blood glucose 0-120, no insulin 120-150, inject 2 units 150-200, inject 4 units 200-250, inject 6 units 250-300, inject 10 units  300-350, inject 12 units  350-400, inject 16 units >400, please call your PCP or go to urgent care during off hours or come to the ED. 09/21/14   Louellen Molder, MD  Insulin Glargine (LANTUS SOLOSTAR) 100 UNIT/ML Solostar Pen Inject 30 Units into the skin daily at 10 pm. 09/21/14   Nishant Dhungel, MD  INSULIN SYRINGE .5CC/29G 29G X 1/2" 0.5 ML MISC 1 application by Does not apply route 3 (three) times daily before meals. 09/21/14   Nishant Dhungel, MD  metoprolol (LOPRESSOR) 50 MG tablet Take 0.5 tablets (25 mg total) by mouth 2 (two) times daily. 09/21/14   Nishant Dhungel, MD   BP 105/67 mmHg  Pulse 64  Temp(Src) 98.7 F (37.1 C) (Oral)  Resp 16  Ht 5' 4"  (1.626 m)  Wt 192 lb (87.091 kg)  BMI 32.94 kg/m2  SpO2 100% Physical Exam Physical Exam  Nursing note and vitals  reviewed. Constitutional: She is oriented to person, place, and time. She appears well-developed and well-nourished. No distress.  HENT:  Head: Normocephalic and atraumatic.  Eyes: Pupils are equal, round, and reactive to light.  Neck: Normal range of motion.  Cardiovascular: Normal rate and intact distal pulses.   Pulmonary/Chest: No respiratory distress.  Abdominal: Normal appearance. She exhibits no distension.  Musculoskeletal: Normal range of motion.  Neurological: She is alert and oriented to person, place, and time. No cranial nerve deficit.  Skin: Skin is warm and dry. No rash noted.  Psychiatric: She has a normal mood and affect. Her behavior is normal.   ED Course  Procedures (including critical care time)  Medications  ondansetron (ZOFRAN) injection 4 mg (4 mg Intravenous Given 09/19/14 1932)  sodium chloride 0.9 % bolus 1,000 mL (0 mLs Intravenous Stopped 09/19/14 1204)  insulin starter kit- pen needles (English) 1 kit (1 kit Other Given 09/20/14 1211)  living well with diabetes book MISC ( Does not apply Given 09/20/14 1629)      Imaging Review No results found.    Patient given IV fluids and admitted to the hospital. MDM   Final diagnoses:  Hyperglycemia        Leonard Schwartz, MD 09/28/14 1547

## 2014-10-26 ENCOUNTER — Other Ambulatory Visit: Payer: Self-pay | Admitting: Internal Medicine

## 2014-11-24 ENCOUNTER — Other Ambulatory Visit: Payer: Self-pay | Admitting: Cardiology

## 2014-11-24 DIAGNOSIS — R0789 Other chest pain: Secondary | ICD-10-CM

## 2014-12-15 ENCOUNTER — Telehealth: Payer: Self-pay | Admitting: Cardiology

## 2014-12-16 ENCOUNTER — Other Ambulatory Visit: Payer: Self-pay | Admitting: Internal Medicine

## 2014-12-18 NOTE — Telephone Encounter (Signed)
Closed encounter °

## 2014-12-21 ENCOUNTER — Encounter: Payer: Self-pay | Admitting: Cardiology

## 2014-12-21 ENCOUNTER — Ambulatory Visit (INDEPENDENT_AMBULATORY_CARE_PROVIDER_SITE_OTHER): Payer: Medicare Other | Admitting: Cardiology

## 2014-12-21 ENCOUNTER — Telehealth (HOSPITAL_COMMUNITY): Payer: Self-pay

## 2014-12-21 VITALS — BP 104/76 | HR 67 | Ht 65.0 in | Wt 181.0 lb

## 2014-12-21 DIAGNOSIS — I2510 Atherosclerotic heart disease of native coronary artery without angina pectoris: Secondary | ICD-10-CM

## 2014-12-21 NOTE — Progress Notes (Signed)
HPI The patient presents for evaluation of back pain. She had a history of a PCI in 2005.  She had a stress perfusion study in 2013 demonstrated no evidence of ischemia and well-preserved ejection fraction.   I saw her recently and she was having some back pain.  I had planned on performing a stress perfusion study but this has not yet been scheduled. She comes back for routine follow-up and actually was hospitalized since I saw her with hyperglycemia. She's now on insulin. However, she has started exercising routinely. She is exercising most days. With an increased level of exertion she feels great. She's lost 10 pounds. She denies any cardiovascular symptoms.    The patient denies any new symptoms such as chest discomfort, neck or arm discomfort. There has been no new shortness of breath, PND or orthopnea. There have been no reported palpitations, presyncope or syncope.  She's not having the back pain she was describing previously.  No Known Allergies  Current Outpatient Prescriptions  Medication Sig Dispense Refill  . amLODipine (NORVASC) 5 MG tablet Take 1 tablet by mouth daily.  5  . aspirin 81 MG tablet Take 81 mg by mouth daily.    . chlorthalidone (HYGROTON) 25 MG tablet Take 12.5 mg by mouth daily.     . Cholecalciferol (CVS VIT D 5000 HIGH-POTENCY PO) Take 1 tablet by mouth daily.     . clopidogrel (PLAVIX) 75 MG tablet Take 75 mg by mouth daily.    . diazepam (VALIUM) 5 MG tablet Take 1 tablet by mouth 2 (two) times daily.  5  . Eszopiclone 3 MG TABS Take 1 tablet by mouth at bedtime as needed (sleep).   1  . glimepiride (AMARYL) 2 MG tablet Take 1 tablet (2 mg total) by mouth daily with breakfast. 30 tablet 0  . glucosamine-chondroitin 500-400 MG tablet Take 1 tablet by mouth daily.    . insulin aspart (NOVOLOG) 100 UNIT/ML injection Blood glucose 0-120, no insulin 120-150, inject 2 units 150-200, inject 4 units 200-250, inject 6 units 250-300, inject 10 units  300-350, inject 12  units 350-400, inject 16 units >400, please call your PCP or go to urgent care during off hours or come to the ED. 10 mL 5  . Insulin Glargine (LANTUS SOLOSTAR) 100 UNIT/ML Solostar Pen Inject 30 Units into the skin daily at 10 pm. 15 mL 5  . INSULIN SYRINGE .5CC/29G 29G X 1/2" 0.5 ML MISC 1 application by Does not apply route 3 (three) times daily before meals. 100 each 0  . Iron Combinations (IRON COMPLEX PO) Take 65 mg by mouth.    . metoprolol (LOPRESSOR) 50 MG tablet Take 0.5 tablets (25 mg total) by mouth 2 (two) times daily. 30 tablet 0  . Multiple Vitamin (MULTIVITAMIN) capsule Take 1 capsule by mouth daily.    Marland Kitchen olmesartan-hydrochlorothiazide (BENICAR HCT) 40-25 MG per tablet Take 1 tablet by mouth daily.    . Omega-3 Fatty Acids (OMEGA 3 PO) Take 1 tablet by mouth daily.    . rosuvastatin (CRESTOR) 10 MG tablet Take 10 mg by mouth daily.    . vitamin C (ASCORBIC ACID) 500 MG tablet Take 500 mg by mouth daily.    . vitamin E 200 UNIT capsule Take 200 Units by mouth daily.     No current facility-administered medications for this visit.    Past Medical History  Diagnosis Date  . Coronary artery disease     DES to ostial RCA 2005  .  Hypertension   . Diabetes mellitus without complication     No longer taking meds  . Osteoarthritis   . Dyslipidemia   . Anemia   . Carpal tunnel syndrome     Past Surgical History  Procedure Laterality Date  . Wrist surgery    . Tonsillectomy    . Appendectomy      ROS:  As stated in the HPI and negative for all other systems.  PHYSICAL EXAM BP 104/76 mmHg  Pulse 67  Ht 5\' 5"  (1.651 m)  Wt 181 lb (82.101 kg)  BMI 30.12 kg/m2 GENERAL:  Well appearing LUNGS:  Clear to auscultation bilaterally BACK:  Point tenderness to palpation posterior left ribs CHEST:  Unremarkable HEART:  PMI not displaced or sustained,S1 and S2 within normal limits, no S3, no S4, no clicks, no rubs, no murmurs ABD:  Flat, positive bowel sounds normal in  frequency in pitch, no bruits, no rebound, no guarding, no midline pulsatile mass, no hepatomegaly, no splenomegaly EXT:  2 plus pulses throughout, no edema, no cyanosis no clubbing SKIN:  No rashes no nodules   ASSESSMENT AND PLAN  CAD  Given the fact that she is exercising routinely not getting any symptoms no further cardiovascular testing is suggested.  I will cancel the previously scheduled POET (Plain Old Exercise Treadmill).    Dyslipidemia - This is followed by Gwynneth AlimentSANDERS,ROBYN N, MD  Hypertension - The blood pressure is at target. No change in medications is indicated. We will continue with therapeutic lifestyle changes (TLC).  Tobacco smoke - She is still not smoking and I congratulated her on this.

## 2014-12-21 NOTE — Telephone Encounter (Signed)
Encounter complete. 

## 2014-12-21 NOTE — Patient Instructions (Addendum)
Your physician wants you to follow-up in: 1 Year You will receive a reminder letter in the mail two months in advance. If you don't receive a letter, please call our office to schedule the follow-up appointment.  You have been referred to Dietitian, Karenann CaiAmy Hager

## 2014-12-22 ENCOUNTER — Telehealth: Payer: Self-pay | Admitting: *Deleted

## 2014-12-22 NOTE — Telephone Encounter (Signed)
Emailed signed order referral for Medical Nutrition Therapy to [email or web address]

## 2014-12-26 ENCOUNTER — Other Ambulatory Visit: Payer: Self-pay | Admitting: Internal Medicine

## 2014-12-26 ENCOUNTER — Encounter (HOSPITAL_COMMUNITY): Payer: Medicare Other

## 2015-01-03 ENCOUNTER — Ambulatory Visit: Payer: Medicare Other | Admitting: Cardiology

## 2015-01-15 ENCOUNTER — Telehealth: Payer: Self-pay | Admitting: Cardiology

## 2015-01-16 NOTE — Telephone Encounter (Signed)
Close encounter 

## 2015-03-16 DIAGNOSIS — N182 Chronic kidney disease, stage 2 (mild): Secondary | ICD-10-CM | POA: Diagnosis not present

## 2015-03-16 DIAGNOSIS — I129 Hypertensive chronic kidney disease with stage 1 through stage 4 chronic kidney disease, or unspecified chronic kidney disease: Secondary | ICD-10-CM | POA: Diagnosis not present

## 2015-03-16 DIAGNOSIS — R51 Headache: Secondary | ICD-10-CM | POA: Diagnosis not present

## 2015-03-16 DIAGNOSIS — E1165 Type 2 diabetes mellitus with hyperglycemia: Secondary | ICD-10-CM | POA: Diagnosis not present

## 2015-03-26 DIAGNOSIS — G629 Polyneuropathy, unspecified: Secondary | ICD-10-CM | POA: Diagnosis not present

## 2015-03-26 DIAGNOSIS — G5600 Carpal tunnel syndrome, unspecified upper limb: Secondary | ICD-10-CM | POA: Diagnosis not present

## 2015-03-29 DIAGNOSIS — E11621 Type 2 diabetes mellitus with foot ulcer: Secondary | ICD-10-CM | POA: Diagnosis not present

## 2015-03-29 DIAGNOSIS — N08 Glomerular disorders in diseases classified elsewhere: Secondary | ICD-10-CM | POA: Diagnosis not present

## 2015-03-29 DIAGNOSIS — E1122 Type 2 diabetes mellitus with diabetic chronic kidney disease: Secondary | ICD-10-CM | POA: Diagnosis not present

## 2015-03-29 DIAGNOSIS — N182 Chronic kidney disease, stage 2 (mild): Secondary | ICD-10-CM | POA: Diagnosis not present

## 2015-05-07 DIAGNOSIS — H8113 Benign paroxysmal vertigo, bilateral: Secondary | ICD-10-CM | POA: Diagnosis not present

## 2015-05-07 DIAGNOSIS — R0982 Postnasal drip: Secondary | ICD-10-CM | POA: Diagnosis not present

## 2015-05-07 DIAGNOSIS — R002 Palpitations: Secondary | ICD-10-CM | POA: Diagnosis not present

## 2015-05-19 ENCOUNTER — Emergency Department (HOSPITAL_COMMUNITY)
Admission: EM | Admit: 2015-05-19 | Discharge: 2015-05-19 | Disposition: A | Discharge status: Home / Self Care | Payer: Medicare Other | Attending: Emergency Medicine | Admitting: Emergency Medicine

## 2015-05-19 ENCOUNTER — Encounter (HOSPITAL_COMMUNITY): Payer: Self-pay | Admitting: Vascular Surgery

## 2015-05-19 DIAGNOSIS — I10 Essential (primary) hypertension: Secondary | ICD-10-CM | POA: Diagnosis not present

## 2015-05-19 DIAGNOSIS — Z7902 Long term (current) use of antithrombotics/antiplatelets: Secondary | ICD-10-CM | POA: Insufficient documentation

## 2015-05-19 DIAGNOSIS — Z794 Long term (current) use of insulin: Secondary | ICD-10-CM | POA: Diagnosis not present

## 2015-05-19 DIAGNOSIS — Z8669 Personal history of other diseases of the nervous system and sense organs: Secondary | ICD-10-CM | POA: Diagnosis not present

## 2015-05-19 DIAGNOSIS — Z87891 Personal history of nicotine dependence: Secondary | ICD-10-CM | POA: Insufficient documentation

## 2015-05-19 DIAGNOSIS — M1990 Unspecified osteoarthritis, unspecified site: Secondary | ICD-10-CM | POA: Diagnosis not present

## 2015-05-19 DIAGNOSIS — Z79899 Other long term (current) drug therapy: Secondary | ICD-10-CM | POA: Insufficient documentation

## 2015-05-19 DIAGNOSIS — M79661 Pain in right lower leg: Secondary | ICD-10-CM | POA: Insufficient documentation

## 2015-05-19 DIAGNOSIS — I2510 Atherosclerotic heart disease of native coronary artery without angina pectoris: Secondary | ICD-10-CM | POA: Diagnosis not present

## 2015-05-19 DIAGNOSIS — Z7982 Long term (current) use of aspirin: Secondary | ICD-10-CM | POA: Insufficient documentation

## 2015-05-19 DIAGNOSIS — M79604 Pain in right leg: Secondary | ICD-10-CM

## 2015-05-19 DIAGNOSIS — E119 Type 2 diabetes mellitus without complications: Secondary | ICD-10-CM | POA: Insufficient documentation

## 2015-05-19 DIAGNOSIS — D649 Anemia, unspecified: Secondary | ICD-10-CM | POA: Insufficient documentation

## 2015-05-19 DIAGNOSIS — E785 Hyperlipidemia, unspecified: Secondary | ICD-10-CM | POA: Diagnosis not present

## 2015-05-19 MED ORDER — ENOXAPARIN SODIUM 150 MG/ML ~~LOC~~ SOLN: 1.0000 mg/kg | Freq: Once | SUBCUTANEOUS | Status: DC

## 2015-05-19 MED ORDER — ENOXAPARIN SODIUM 80 MG/0.8ML ~~LOC~~ SOLN
80.0000 mg | Freq: Once | SUBCUTANEOUS | Status: AC
  Administered 2015-05-19: 80 mg via SUBCUTANEOUS
  Filled 2015-05-19: qty 0.8

## 2015-05-19 NOTE — ED Provider Notes (Addendum)
CSN: [id number]     Arrival date & time 05/19/15  1903 History   First MD Initiated Contact with Patient 05/19/15 1936     Chief Complaint  Patient presents with  . Leg Pain     (Consider location/radiation/quality/duration/timing/severity/associated sxs/prior Treatment) Patient is a 64 y.o. female presenting with leg pain. The history is provided by the patient.  Leg Pain Location:  Leg Time since incident:  5 days Injury: no   Leg location:  R lower leg Pain details:    Quality:  Burning (burning warm sensation but no true pain)   Radiates to:  Does not radiate   Severity:  No pain   Onset quality:  Sudden   Timing:  Intermittent   Progression:  Worsening Chronicity:  New Relieved by:  None tried Worsened by:  Nothing tried Ineffective treatments:  None tried Associated symptoms: no back pain, no decreased ROM, no fatigue, no muscle weakness, no neck pain, no numbness and no swelling     Past Medical History  Diagnosis Date  . Coronary artery disease     DES to ostial RCA 2005  . Hypertension   . Diabetes mellitus without complication (HCC)     No longer taking meds  . Osteoarthritis   . Dyslipidemia   . Anemia   . Carpal tunnel syndrome    Past Surgical History  Procedure Laterality Date  . Wrist surgery    . Tonsillectomy    . Appendectomy     Family History  Problem Relation Age of Onset  . CAD Father 4  . CAD Mother 69  . CAD Brother    Social History  Substance Use Topics  . Smoking status: Former Smoker    Types: Cigarettes    Quit date: 12/20/2013  . Smokeless tobacco: Never Used     Comment: Stopped cigarettes one year.    . Alcohol Use: No   OB History    No data available     Review of Systems  Constitutional: Negative for fatigue.  Musculoskeletal: Negative for back pain and neck pain.  All other systems reviewed and are negative.     Allergies  Review of patient's allergies indicates no known allergies.  Home Medications    Prior to Admission medications   Medication Sig Start Date End Date Taking? Authorizing Provider  amLODipine (NORVASC) 5 MG tablet Take 1 tablet by mouth daily. 07/19/14   Historical Provider, MD  aspirin 81 MG tablet Take 81 mg by mouth daily.    Historical Provider, MD  chlorthalidone (HYGROTON) 25 MG tablet Take 12.5 mg by mouth daily.  11/12/13   Historical Provider, MD  Cholecalciferol ([employer]S VIT D 5000 HIGH-POTENCY PO) Take 1 tablet by mouth daily.     Historical Provider, MD  clopidogrel (PLAVIX) 75 MG tablet Take 75 mg by mouth daily.    Historical Provider, MD  diazepam (VALIUM) 5 MG tablet Take 1 tablet by mouth 2 (two) times daily. 06/29/14   Historical Provider, MD  Eszopiclone 3 MG TABS Take 1 tablet by mouth at bedtime as needed (sleep).  07/19/14   Historical Provider, MD  glimepiride (AMARYL) 2 MG tablet Take 1 tablet (2 mg total) by mouth daily with breakfast. 09/22/14   Nishant Dhungel, MD  glucosamine-chondroitin 500-400 MG tablet Take 1 tablet by mouth daily.    Historical Provider, MD  insulin aspart (NOVOLOG) 100 UNIT/ML injection Blood glucose 0-120, no insulin 120-150, inject 2 units 150-200, inject 4 units 200-250, inject 6  units 250-300, inject 10 units  300-350, inject 12 units 350-400, inject 16 units >400, please call your PCP or go to urgent care during off hours or come to the ED. 09/21/14   Eddie NorthNishant Dhungel, MD  Insulin Glargine (LANTUS SOLOSTAR) 100 UNIT/ML Solostar Pen Inject 30 Units into the skin daily at 10 pm. 09/21/14   Nishant Dhungel, MD  INSULIN SYRINGE .5CC/29G 29G X 1/2" 0.5 ML MISC 1 application by Does not apply route 3 (three) times daily before meals. 09/21/14   Nishant Dhungel, MD  Iron Combinations (IRON COMPLEX PO) Take 65 mg by mouth.    Historical Provider, MD  metoprolol (LOPRESSOR) 50 MG tablet Take 0.5 tablets (25 mg total) by mouth 2 (two) times daily. 09/21/14   Nishant Dhungel, MD  Multiple Vitamin (MULTIVITAMIN) capsule Take 1 capsule by mouth  daily.    Historical Provider, MD  olmesartan-hydrochlorothiazide (BENICAR HCT) 40-25 MG per tablet Take 1 tablet by mouth daily.    Historical Provider, MD  Omega-3 Fatty Acids (OMEGA 3 PO) Take 1 tablet by mouth daily.    Historical Provider, MD  rosuvastatin (CRESTOR) 10 MG tablet Take 10 mg by mouth daily.    Historical Provider, MD  vitamin C (ASCORBIC ACID) 500 MG tablet Take 500 mg by mouth daily.    Historical Provider, MD  vitamin E 200 UNIT capsule Take 200 Units by mouth daily.    Historical Provider, MD   BP 148/89 mmHg  Pulse 55  Temp(Src) 97.7 F (36.5 C) (Oral)  Resp 18  Wt 182 lb (82.555 kg)  SpO2 98% Physical Exam  Constitutional: She is oriented to person, place, and time. She appears well-developed and well-nourished. No distress.  HENT:  Head: Normocephalic and atraumatic.  Eyes: EOM are normal. Pupils are equal, round, and reactive to light.  Cardiovascular: Normal rate.   Pulmonary/Chest: Effort normal.  Musculoskeletal:       Legs: No rashes, swelling or erythema.  2+ DP pulse  Normal sensation in the foot and toes on the right.  Neurological: She is alert and oriented to person, place, and time.  Skin: Skin is warm and dry. No rash noted. No erythema.  Psychiatric: She has a normal mood and affect. Her behavior is normal.  Nursing note and vitals reviewed.   ED Course  Procedures (including critical care time) Labs Review Labs Reviewed - No data to display  Imaging Review No results found. I have personally reviewed and evaluated these images and lab results as part of my medical decision-making.   EKG Interpretation None      MDM   Final diagnoses:  Right leg pain    Patient is a 64 year old female who presents with a warm burning sensation in the right calf that was intermittent since Tuesday but became constant today. She denies tenderness, redness, swelling. No prior symptoms of similar. No trauma. On exam unable to reproduce any type of  pain with normal-appearing skin without signs of cellulitis. No palpable cyst or abscesses. Normal function of the foot with a 2+ DP pulse. Feel most likely patient's symptoms are nerve related however feel that she does warrant DVT rule out. Ultrasound is not available at this time. Patient was given a dose of Lovenox and is to return tomorrow for vascular study.    Gwyneth SproutWhitney Zahki Hoogendoorn, MD 05/19/15 [id number]  Gwyneth SproutWhitney Carola Viramontes, MD 05/19/15 2046

## 2015-05-19 NOTE — ED Notes (Signed)
Pt verbalized understanding of d/c instructions, prescriptions, and follow-up care. No further questions/concerns, VSS, assisted to lobby in wheelchair.  Pt verbalized understanding to return to ED in morning for vascular study.

## 2015-05-19 NOTE — ED Notes (Signed)
Pt reports to the ED for eval of hot sensation to her right calf since Tuesday. Denies any swelling, erythema, or ecchymosis. Also denies any known injury, numbness, tingling, or paralysis. Pt has hx of diabetes. Pt A&Ox4, resp e/u, and skin warm and dry.

## 2015-05-19 NOTE — ED Notes (Signed)
Pt c/o warm sensation in right lower leg x's 5 days.  Strong pedal pulse present.  Pt denies any pain with wt. Bearing or ambulation

## 2015-05-20 ENCOUNTER — Ambulatory Visit (HOSPITAL_COMMUNITY)
Admission: RE | Admit: 2015-05-20 | Discharge: 2015-05-20 | Disposition: A | Discharge status: Home / Self Care | Payer: Medicare Other | Source: Ambulatory Visit | Attending: Emergency Medicine | Admitting: Emergency Medicine

## 2015-05-20 ENCOUNTER — Other Ambulatory Visit (HOSPITAL_COMMUNITY): Payer: Self-pay | Admitting: Emergency Medicine

## 2015-05-20 DIAGNOSIS — M79609 Pain in unspecified limb: Secondary | ICD-10-CM | POA: Diagnosis present

## 2015-05-20 DIAGNOSIS — R52 Pain, unspecified: Secondary | ICD-10-CM | POA: Diagnosis not present

## 2015-05-20 DIAGNOSIS — M79604 Pain in right leg: Secondary | ICD-10-CM | POA: Diagnosis not present

## 2015-05-20 NOTE — Progress Notes (Signed)
VASCULAR LAB PRELIMINARY  PRELIMINARY  PRELIMINARY  PRELIMINARY  Right lower extremity venous duplex completed.    Preliminary report:  There is no DVT or SVT noted in the right lower extremity.    Yuritzi Kamp, RVT 05/20/2015, 10:52 AM

## 2015-05-31 DIAGNOSIS — I10 Essential (primary) hypertension: Secondary | ICD-10-CM | POA: Diagnosis not present

## 2015-05-31 DIAGNOSIS — R42 Dizziness and giddiness: Secondary | ICD-10-CM | POA: Diagnosis not present

## 2015-06-01 DIAGNOSIS — E1165 Type 2 diabetes mellitus with hyperglycemia: Secondary | ICD-10-CM | POA: Diagnosis not present

## 2015-06-14 DIAGNOSIS — N08 Glomerular disorders in diseases classified elsewhere: Secondary | ICD-10-CM | POA: Diagnosis not present

## 2015-06-14 DIAGNOSIS — I129 Hypertensive chronic kidney disease with stage 1 through stage 4 chronic kidney disease, or unspecified chronic kidney disease: Secondary | ICD-10-CM | POA: Diagnosis not present

## 2015-06-14 DIAGNOSIS — N182 Chronic kidney disease, stage 2 (mild): Secondary | ICD-10-CM | POA: Diagnosis not present

## 2015-06-14 DIAGNOSIS — E1122 Type 2 diabetes mellitus with diabetic chronic kidney disease: Secondary | ICD-10-CM | POA: Diagnosis not present

## 2015-06-14 DIAGNOSIS — N951 Menopausal and female climacteric states: Secondary | ICD-10-CM | POA: Diagnosis not present

## 2015-07-12 DIAGNOSIS — I129 Hypertensive chronic kidney disease with stage 1 through stage 4 chronic kidney disease, or unspecified chronic kidney disease: Secondary | ICD-10-CM | POA: Diagnosis not present

## 2015-07-12 DIAGNOSIS — N182 Chronic kidney disease, stage 2 (mild): Secondary | ICD-10-CM | POA: Diagnosis not present

## 2015-07-12 DIAGNOSIS — M542 Cervicalgia: Secondary | ICD-10-CM | POA: Diagnosis not present

## 2015-07-12 DIAGNOSIS — E1140 Type 2 diabetes mellitus with diabetic neuropathy, unspecified: Secondary | ICD-10-CM | POA: Diagnosis not present

## 2015-07-25 ENCOUNTER — Other Ambulatory Visit: Payer: Self-pay

## 2015-07-25 DIAGNOSIS — Z1231 Encounter for screening mammogram for malignant neoplasm of breast: Secondary | ICD-10-CM

## 2015-08-27 ENCOUNTER — Ambulatory Visit
Admission: RE | Admit: 2015-08-27 | Discharge: 2015-08-27 | Disposition: A | Discharge status: Home / Self Care | Payer: Medicare Other | Source: Ambulatory Visit

## 2015-08-27 DIAGNOSIS — Z1231 Encounter for screening mammogram for malignant neoplasm of breast: Secondary | ICD-10-CM

## 2015-09-13 DIAGNOSIS — Z79899 Other long term (current) drug therapy: Secondary | ICD-10-CM | POA: Diagnosis not present

## 2015-09-13 DIAGNOSIS — N182 Chronic kidney disease, stage 2 (mild): Secondary | ICD-10-CM | POA: Diagnosis not present

## 2015-09-13 DIAGNOSIS — I129 Hypertensive chronic kidney disease with stage 1 through stage 4 chronic kidney disease, or unspecified chronic kidney disease: Secondary | ICD-10-CM | POA: Diagnosis not present

## 2015-09-13 DIAGNOSIS — E1140 Type 2 diabetes mellitus with diabetic neuropathy, unspecified: Secondary | ICD-10-CM | POA: Diagnosis not present

## 2015-09-21 DIAGNOSIS — Z79899 Other long term (current) drug therapy: Secondary | ICD-10-CM | POA: Diagnosis not present

## 2015-09-21 DIAGNOSIS — B1920 Unspecified viral hepatitis C without hepatic coma: Secondary | ICD-10-CM | POA: Diagnosis not present

## 2015-10-10 DIAGNOSIS — H2521 Age-related cataract, morgagnian type, right eye: Secondary | ICD-10-CM | POA: Diagnosis not present

## 2015-10-10 DIAGNOSIS — E119 Type 2 diabetes mellitus without complications: Secondary | ICD-10-CM | POA: Diagnosis not present

## 2015-10-10 DIAGNOSIS — B182 Chronic viral hepatitis C: Secondary | ICD-10-CM | POA: Diagnosis not present

## 2015-10-10 DIAGNOSIS — H52223 Regular astigmatism, bilateral: Secondary | ICD-10-CM | POA: Diagnosis not present

## 2015-10-10 DIAGNOSIS — H5213 Myopia, bilateral: Secondary | ICD-10-CM | POA: Diagnosis not present

## 2015-10-11 ENCOUNTER — Other Ambulatory Visit (HOSPITAL_COMMUNITY): Payer: Self-pay | Admitting: Nurse Practitioner

## 2015-10-11 DIAGNOSIS — B182 Chronic viral hepatitis C: Secondary | ICD-10-CM

## 2015-10-17 ENCOUNTER — Other Ambulatory Visit: Payer: Self-pay | Admitting: Nurse Practitioner

## 2015-10-17 ENCOUNTER — Ambulatory Visit
Admission: RE | Admit: 2015-10-17 | Discharge: 2015-10-17 | Disposition: A | Discharge status: Home / Self Care | Payer: Medicare Other | Source: Ambulatory Visit | Attending: Internal Medicine | Admitting: Internal Medicine

## 2015-10-17 DIAGNOSIS — M179 Osteoarthritis of knee, unspecified: Secondary | ICD-10-CM | POA: Diagnosis not present

## 2015-10-17 DIAGNOSIS — M25562 Pain in left knee: Secondary | ICD-10-CM | POA: Diagnosis not present

## 2015-10-17 DIAGNOSIS — E1140 Type 2 diabetes mellitus with diabetic neuropathy, unspecified: Secondary | ICD-10-CM | POA: Diagnosis not present

## 2015-10-25 NOTE — Progress Notes (Signed)
No show

## 2015-10-26 ENCOUNTER — Encounter: Payer: Medicare Other | Admitting: Cardiology

## 2015-10-30 ENCOUNTER — Ambulatory Visit (HOSPITAL_COMMUNITY)
Admission: RE | Admit: 2015-10-30 | Discharge: 2015-10-30 | Disposition: A | Discharge status: Home / Self Care | Payer: Medicare Other | Source: Ambulatory Visit | Attending: Nurse Practitioner | Admitting: Nurse Practitioner

## 2015-10-30 DIAGNOSIS — B182 Chronic viral hepatitis C: Secondary | ICD-10-CM

## 2015-10-30 DIAGNOSIS — B1920 Unspecified viral hepatitis C without hepatic coma: Secondary | ICD-10-CM | POA: Diagnosis not present

## 2015-11-13 ENCOUNTER — Encounter: Payer: Self-pay | Admitting: *Deleted

## 2015-11-13 DIAGNOSIS — M25562 Pain in left knee: Secondary | ICD-10-CM | POA: Diagnosis not present

## 2015-11-22 DIAGNOSIS — M25562 Pain in left knee: Secondary | ICD-10-CM | POA: Diagnosis not present

## 2015-11-22 DIAGNOSIS — M1712 Unilateral primary osteoarthritis, left knee: Secondary | ICD-10-CM | POA: Diagnosis not present

## 2015-11-28 DIAGNOSIS — M1712 Unilateral primary osteoarthritis, left knee: Secondary | ICD-10-CM | POA: Diagnosis not present

## 2015-11-28 DIAGNOSIS — M25562 Pain in left knee: Secondary | ICD-10-CM | POA: Diagnosis not present

## 2015-11-30 DIAGNOSIS — M1712 Unilateral primary osteoarthritis, left knee: Secondary | ICD-10-CM | POA: Diagnosis not present

## 2015-11-30 DIAGNOSIS — M25562 Pain in left knee: Secondary | ICD-10-CM | POA: Diagnosis not present

## 2015-12-04 DIAGNOSIS — M25562 Pain in left knee: Secondary | ICD-10-CM | POA: Diagnosis not present

## 2015-12-04 DIAGNOSIS — M1712 Unilateral primary osteoarthritis, left knee: Secondary | ICD-10-CM | POA: Diagnosis not present

## 2015-12-05 DIAGNOSIS — Z23 Encounter for immunization: Secondary | ICD-10-CM | POA: Diagnosis not present

## 2015-12-06 DIAGNOSIS — M25562 Pain in left knee: Secondary | ICD-10-CM | POA: Diagnosis not present

## 2015-12-06 DIAGNOSIS — M1712 Unilateral primary osteoarthritis, left knee: Secondary | ICD-10-CM | POA: Diagnosis not present

## 2015-12-13 DIAGNOSIS — M25562 Pain in left knee: Secondary | ICD-10-CM | POA: Diagnosis not present

## 2015-12-16 NOTE — Progress Notes (Signed)
HPI The patient presents for follow up of CAD.  I saw her one year ago.  She had a history of a PCI in 2005.  She had a stress perfusion study in 2013 demonstrated no evidence of ischemia and well-preserved ejection fraction.  Since I last saw her she has done well.  The patient denies any new symptoms such as chest discomfort, neck or arm discomfort. There has been no new shortness of breath, PND or orthopnea. There have been no reported palpitations, presyncope or syncope.  No Known Allergies  Current Outpatient Prescriptions  Medication Sig Dispense Refill  . amLODipine (NORVASC) 5 MG tablet Take 1 tablet by mouth daily.  5  . aspirin 81 MG tablet Take 81 mg by mouth daily.    . Cholecalciferol (CVS VIT D 5000 HIGH-POTENCY PO) Take 1 tablet by mouth daily.     . Insulin Glargine (LANTUS SOLOSTAR) 100 UNIT/ML Solostar Pen Inject 30 Units into the skin daily at 10 pm. (Patient taking differently: Inject 15 Units into the skin daily at 10 pm. ) 15 mL 5  . Iron Combinations (IRON COMPLEX PO) Take 65 mg by mouth.    . metoprolol (LOPRESSOR) 50 MG tablet Take 0.5 tablets (25 mg total) by mouth 2 (two) times daily. 30 tablet 0  . Multiple Vitamin (MULTIVITAMIN) capsule Take 1 capsule by mouth daily.    Marland Kitchen. olmesartan-hydrochlorothiazide (BENICAR HCT) 40-25 MG per tablet Take 1 tablet by mouth daily.    . Omega-3 Fatty Acids (OMEGA 3 PO) Take 1 tablet by mouth daily.    . rosuvastatin (CRESTOR) 10 MG tablet Take 10 mg by mouth daily.    . vitamin C (ASCORBIC ACID) 500 MG tablet Take 500 mg by mouth daily.    . vitamin E 200 UNIT capsule Take 200 Units by mouth daily.    Marland Kitchen. glucosamine-chondroitin 500-400 MG tablet Take 1 tablet by mouth daily. Reported on 12/17/2015     No current facility-administered medications for this visit.    Past Medical History  Diagnosis Date  . Coronary artery disease     DES to ostial RCA 2005  . Hypertension   . Diabetes mellitus without complication (HCC)    No longer taking meds  . Osteoarthritis   . Dyslipidemia   . Anemia   . Carpal tunnel syndrome     Past Surgical History  Procedure Laterality Date  . Wrist surgery    . Tonsillectomy    . Appendectomy      ROS:  As stated in the HPI and negative for all other systems.  PHYSICAL EXAM BP 106/72 mmHg  Pulse 60  Ht 5\' 5"  (1.651 m)  Wt 184 lb 6 oz (83.632 kg)  BMI 30.68 kg/m2 GENERAL:  Well appearing LUNGS:  Clear to auscultation bilaterally BACK:  Point tenderness to palpation posterior left ribs CHEST:  Unremarkable HEART:  PMI not displaced or sustained,S1 and S2 within normal limits, no S3, no S4, no clicks, no rubs, no murmurs ABD:  Flat, positive bowel sounds normal in frequency in pitch, no bruits, no rebound, no guarding, no midline pulsatile mass, no hepatomegaly, no splenomegaly EXT:  2 plus pulses throughout, no edema, no cyanosis no clubbing SKIN:  No rashes no nodules  EKG:  Sinus rhythm, rate 61, axis within normal limits, intervals within normal limits, poor anterior R wave progression, no acute ST-T wave changes area  12/17/2015    ASSESSMENT AND PLAN  CAD  The patient has no new  sypmtoms.  No further cardiovascular testing is indicated.  We will continue with aggressive risk reduction and meds as listed.  Dyslipidemia/DM - This is followed by Gwynneth AlimentSANDERS,ROBYN N, MD.  I have asked for a copy of her lipids and A1C.  Hypertension - The blood pressure is at target. No change in medications is indicated. We will continue with therapeutic lifestyle changes (TLC).  Tobacco smoke - She is still not smoking (two years)!!!

## 2015-12-17 ENCOUNTER — Ambulatory Visit (INDEPENDENT_AMBULATORY_CARE_PROVIDER_SITE_OTHER): Payer: Medicare Other | Admitting: Cardiology

## 2015-12-17 ENCOUNTER — Encounter: Payer: Self-pay | Admitting: Cardiology

## 2015-12-17 VITALS — BP 106/72 | HR 60 | Ht 65.0 in | Wt 184.4 lb

## 2015-12-17 DIAGNOSIS — I2510 Atherosclerotic heart disease of native coronary artery without angina pectoris: Secondary | ICD-10-CM

## 2015-12-17 DIAGNOSIS — I10 Essential (primary) hypertension: Secondary | ICD-10-CM | POA: Diagnosis not present

## 2015-12-17 NOTE — Patient Instructions (Signed)
Medication Instructions:  Continue current medications  Labwork: NONE  Testing/Procedures: NONE  Follow-Up: Your physician wants you to follow-up in: 1 Year. You will receive a reminder letter in the mail two months in advance. If you don't receive a letter, please call our office to schedule the follow-up appointment.   Any Other Special Instructions Will Be Listed Below (If Applicable).   If you need a refill on your cardiac medications before your next appointment, please call your pharmacy.   

## 2015-12-18 DIAGNOSIS — M25562 Pain in left knee: Secondary | ICD-10-CM | POA: Diagnosis not present

## 2015-12-18 DIAGNOSIS — M1712 Unilateral primary osteoarthritis, left knee: Secondary | ICD-10-CM | POA: Diagnosis not present

## 2015-12-19 DIAGNOSIS — Z0000 Encounter for general adult medical examination without abnormal findings: Secondary | ICD-10-CM | POA: Diagnosis not present

## 2015-12-19 DIAGNOSIS — D649 Anemia, unspecified: Secondary | ICD-10-CM | POA: Diagnosis not present

## 2015-12-19 DIAGNOSIS — E1140 Type 2 diabetes mellitus with diabetic neuropathy, unspecified: Secondary | ICD-10-CM | POA: Diagnosis not present

## 2015-12-19 DIAGNOSIS — I129 Hypertensive chronic kidney disease with stage 1 through stage 4 chronic kidney disease, or unspecified chronic kidney disease: Secondary | ICD-10-CM | POA: Diagnosis not present

## 2015-12-19 DIAGNOSIS — E559 Vitamin D deficiency, unspecified: Secondary | ICD-10-CM | POA: Diagnosis not present

## 2015-12-19 DIAGNOSIS — N182 Chronic kidney disease, stage 2 (mild): Secondary | ICD-10-CM | POA: Diagnosis not present

## 2015-12-24 ENCOUNTER — Other Ambulatory Visit: Payer: Self-pay | Admitting: Internal Medicine

## 2015-12-24 DIAGNOSIS — R1905 Periumbilic swelling, mass or lump: Secondary | ICD-10-CM

## 2015-12-24 DIAGNOSIS — K469 Unspecified abdominal hernia without obstruction or gangrene: Secondary | ICD-10-CM

## 2015-12-27 DIAGNOSIS — K740 Hepatic fibrosis: Secondary | ICD-10-CM | POA: Diagnosis not present

## 2015-12-27 DIAGNOSIS — B182 Chronic viral hepatitis C: Secondary | ICD-10-CM | POA: Diagnosis not present

## 2016-01-01 ENCOUNTER — Ambulatory Visit
Admission: RE | Admit: 2016-01-01 | Discharge: 2016-01-01 | Disposition: A | Discharge status: Home / Self Care | Payer: Medicare Other | Source: Ambulatory Visit | Attending: Internal Medicine | Admitting: Internal Medicine

## 2016-01-01 DIAGNOSIS — R1907 Generalized intra-abdominal and pelvic swelling, mass and lump: Secondary | ICD-10-CM | POA: Diagnosis not present

## 2016-01-01 DIAGNOSIS — K469 Unspecified abdominal hernia without obstruction or gangrene: Secondary | ICD-10-CM

## 2016-01-01 DIAGNOSIS — R1905 Periumbilic swelling, mass or lump: Secondary | ICD-10-CM

## 2016-01-01 MED ORDER — IOPAMIDOL (ISOVUE-300) INJECTION 61%
100.0000 mL | Freq: Once | INTRAVENOUS | Status: AC | PRN
  Administered 2016-01-01: 100 mL via INTRAVENOUS

## 2016-01-04 DIAGNOSIS — Z23 Encounter for immunization: Secondary | ICD-10-CM | POA: Diagnosis not present

## 2016-01-11 ENCOUNTER — Telehealth: Payer: Self-pay | Admitting: Cardiology

## 2016-01-11 NOTE — Telephone Encounter (Signed)
New message     FYI Calling to let Dr Antoine PocheHochrein know that pt has been switch from crestor to pravastatin 10mg  daily for 12 weeks while on treatment for hepatitis C,  then resume crestor after treatment.

## 2016-01-11 NOTE — Telephone Encounter (Signed)
Will make dr hochrein aware. 

## 2016-01-14 ENCOUNTER — Telehealth: Payer: Self-pay | Admitting: Cardiology

## 2016-01-14 NOTE — Telephone Encounter (Signed)
Error

## 2016-02-14 DIAGNOSIS — K740 Hepatic fibrosis: Secondary | ICD-10-CM | POA: Diagnosis not present

## 2016-02-14 DIAGNOSIS — B182 Chronic viral hepatitis C: Secondary | ICD-10-CM | POA: Diagnosis not present

## 2016-04-17 DIAGNOSIS — N182 Chronic kidney disease, stage 2 (mild): Secondary | ICD-10-CM | POA: Diagnosis not present

## 2016-04-17 DIAGNOSIS — I129 Hypertensive chronic kidney disease with stage 1 through stage 4 chronic kidney disease, or unspecified chronic kidney disease: Secondary | ICD-10-CM | POA: Diagnosis not present

## 2016-04-17 DIAGNOSIS — E1140 Type 2 diabetes mellitus with diabetic neuropathy, unspecified: Secondary | ICD-10-CM | POA: Diagnosis not present

## 2016-04-21 DIAGNOSIS — Z79899 Other long term (current) drug therapy: Secondary | ICD-10-CM | POA: Diagnosis not present

## 2016-07-20 IMAGING — CT CT ABD-PELV W/ CM
3 of 5 series · 13 of 36 positions shown, 19 images · IV contrast (READICAT/WATER & [ID] ISOVUE 300)
Comparison: CT 07/30/2006

CLINICAL DATA: Supraumbilical mass.  Present for several years.

EXAM:
CT ABDOMEN AND PELVIS WITH CONTRAST
TECHNIQUE: Multidetector CT imaging of the abdomen and pelvis was performed
using the standard protocol following bolus administration of
intravenous contrast.
CONTRAST:  100mL [id number] IOPAMIDOL ([id number]) INJECTION 61%

[Series 3: abd/pelvis with · axial · 0.70mm/px · z∈[-249,+36]mm · 7 of 77 slices shown, 12 images]
[im 10/77  soft-tissue]
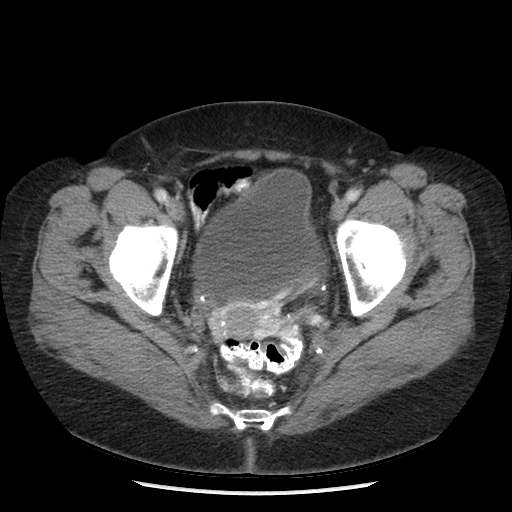
[im 10/77  bone]
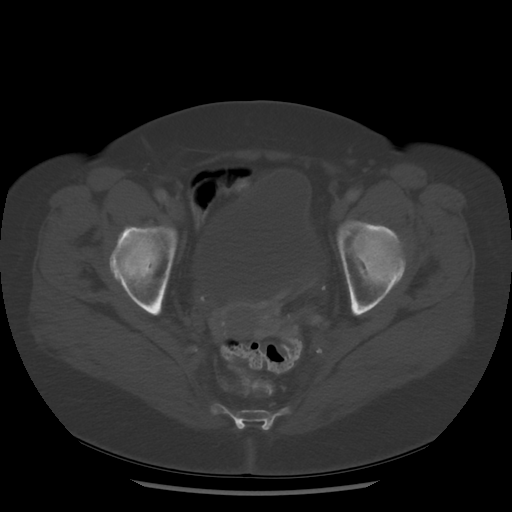
[im 20/77  soft-tissue]
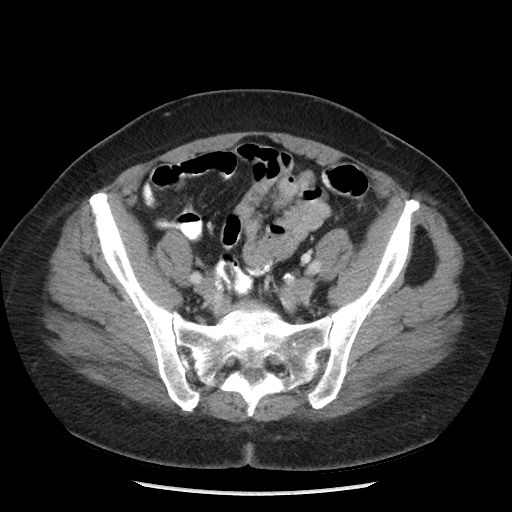
[im 29/77  soft-tissue]
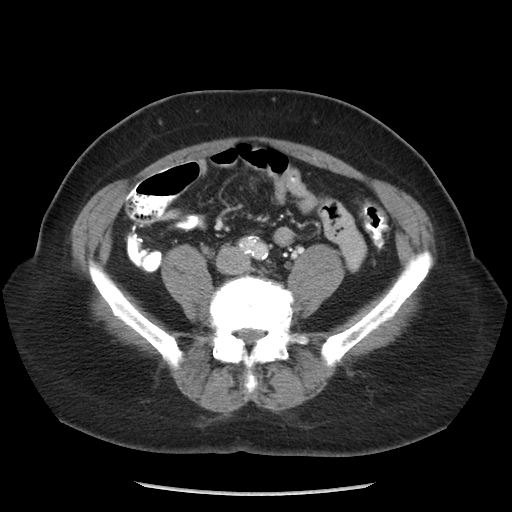
[im 39/77  soft-tissue]
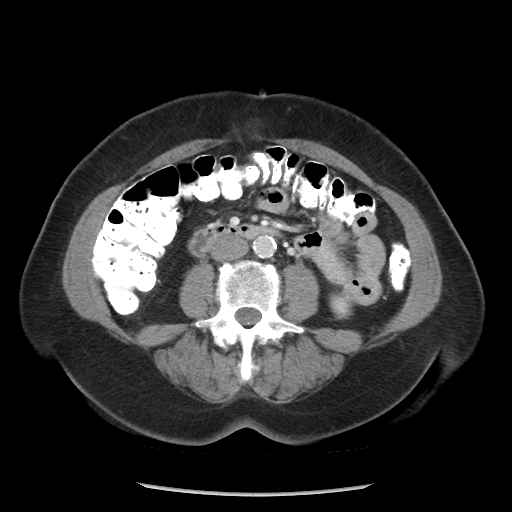
[im 39/77  lung]
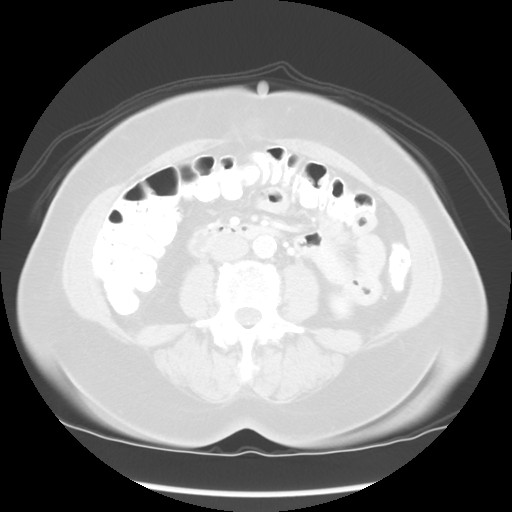
[im 48/77  soft-tissue]
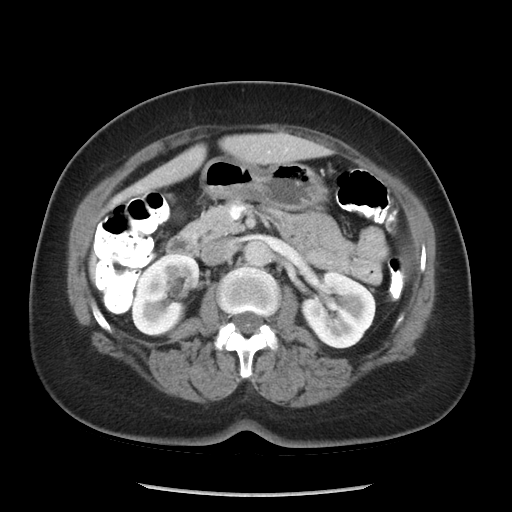
[im 48/77  lung]
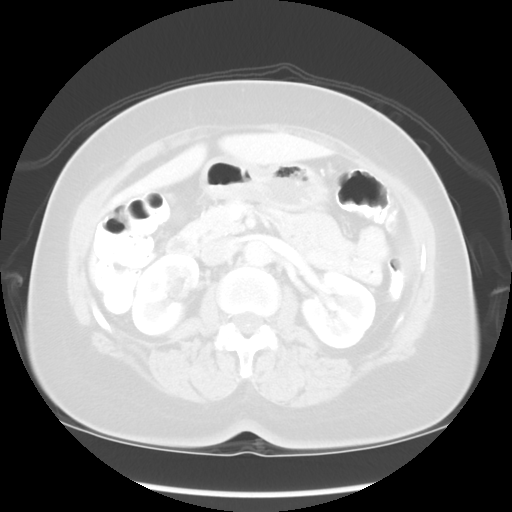
[im 58/77  soft-tissue]
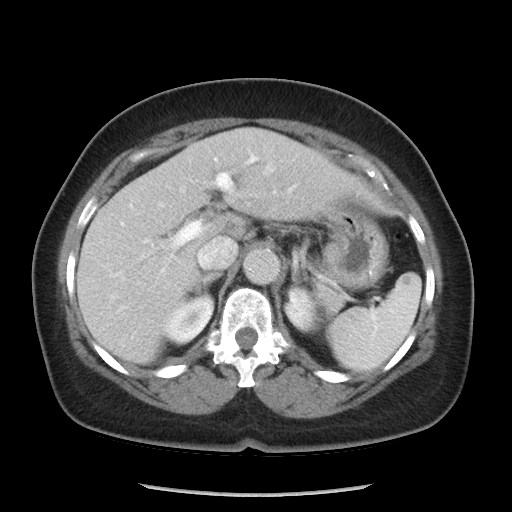
[im 58/77  lung]
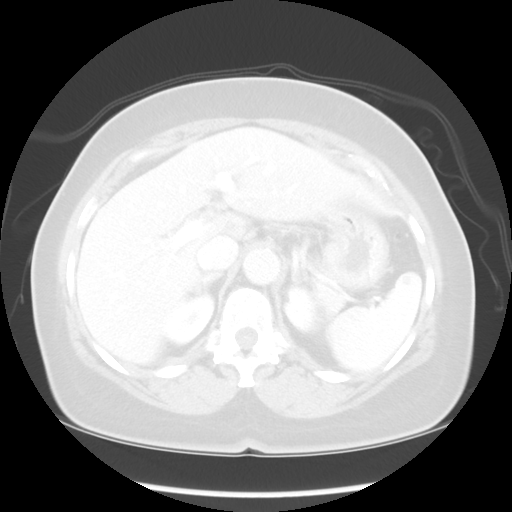
[im 67/77  soft-tissue]
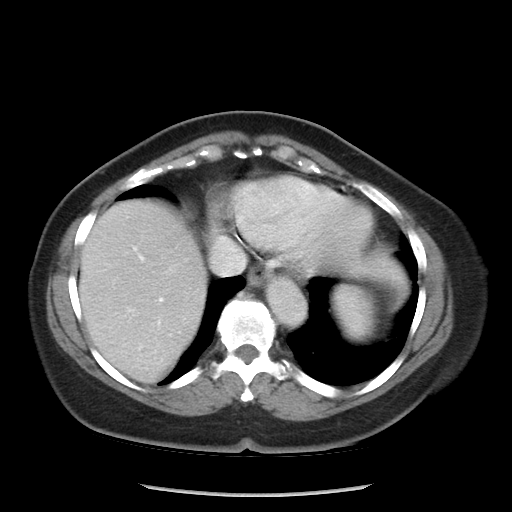
[im 67/77  lung]
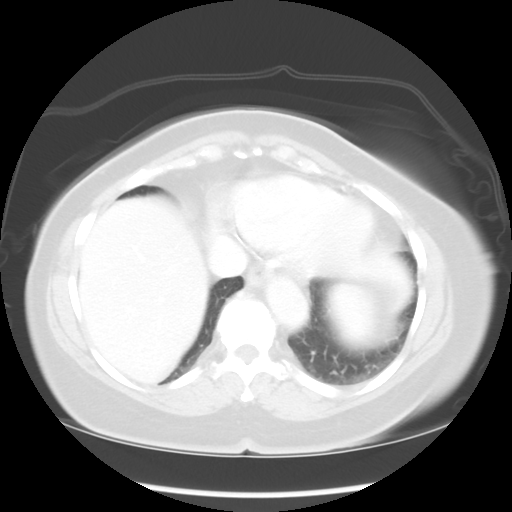

[Series 601: coronal body · coronal · 0.86mm/px · 1 of 115 slices shown, 2 images]
[im 39/115  soft-tissue]
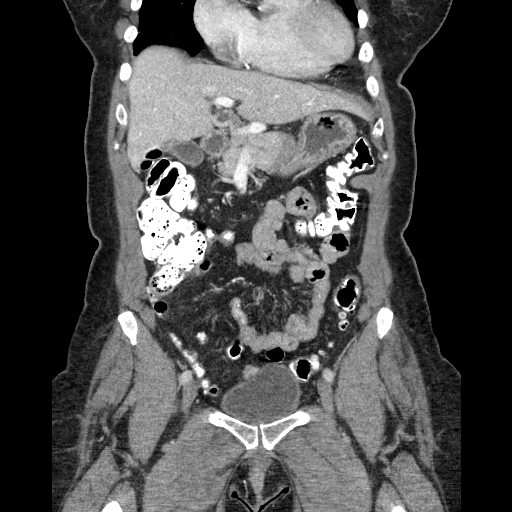
[im 39/115  bone]
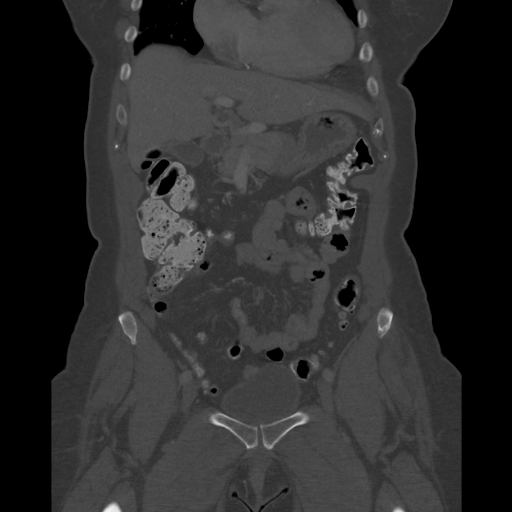

[Series 602: sagittal body · sagittal · 0.86mm/px · 5 of 145 slices shown]
[im 10/145  soft-tissue]
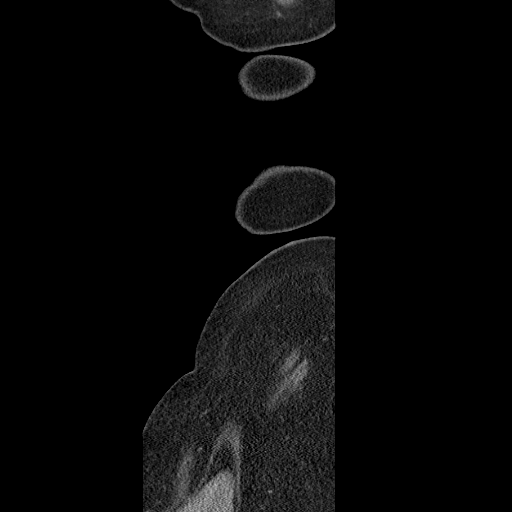
[im 28/145  soft-tissue]
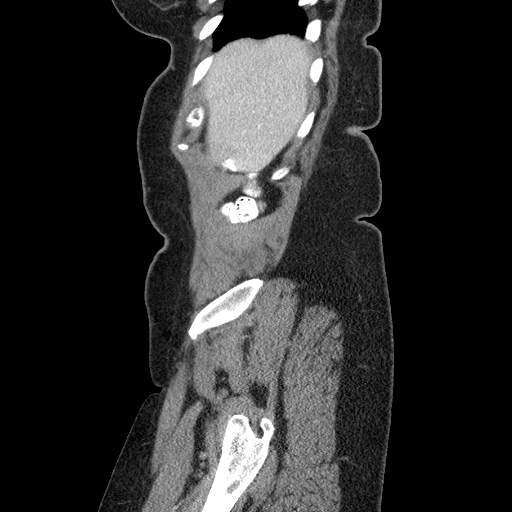
[im 46/145  soft-tissue]
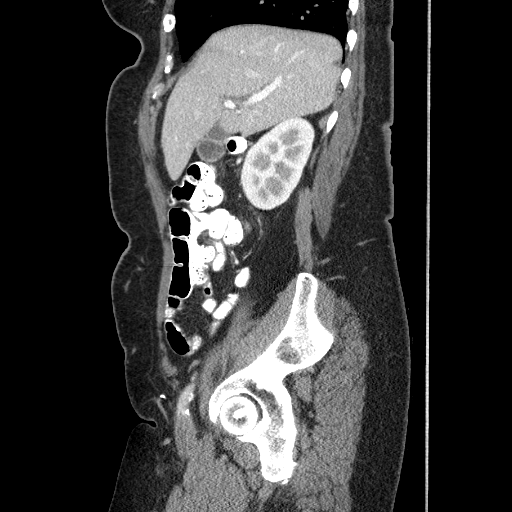
[im 64/145  soft-tissue]
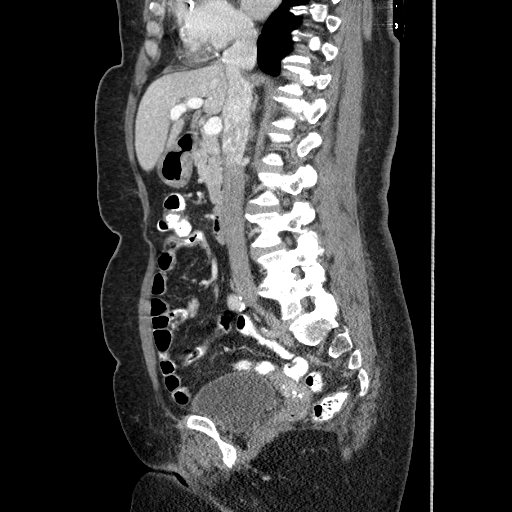
[im 82/145  soft-tissue]
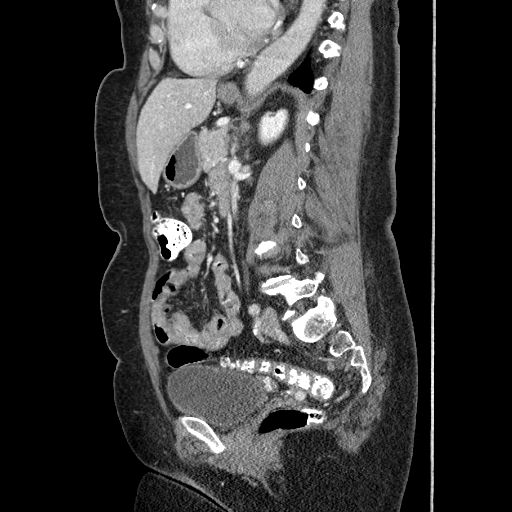

[13 of 36 positions shown; findings below may reference images not displayed]

FINDINGS: Lower chest: Mild cardiomegaly. Coronary artery calcifications in
the visualized right coronary artery. Lung bases clear. No
effusions.

Hepatobiliary: No focal hepatic abnormality. Gallbladder
unremarkable.

Pancreas: No focal abnormality or ductal dilatation.

Spleen: No focal abnormality.  Normal size.

Adrenals/Urinary Tract: 1.8 cm cyst in the midpole of the left
kidney. No hydronephrosis. Small nodule in the left adrenal gland is
stable since prior study, likely adenoma. Right adrenal gland and
urinary bladder unremarkable.

Stomach/Bowel: Sigmoid diverticulosis. No active diverticulitis.
Stomach and small bowel decompressed, grossly unremarkable.

Vascular/Lymphatic: Diffuse aortic calcifications. No aneurysm. No
adenopathy.

Reproductive: Uterus and adnexa unremarkable.  No mass.

Other: No free fluid or free air.

Musculoskeletal: Degenerative disc disease in the lower lumbar
spine. No acute bony abnormality.
IMPRESSION: No acute findings.

Coronary artery disease.  Aortic atherosclerosis.

Sigmoid diverticulosis.

## 2016-07-21 DIAGNOSIS — E1140 Type 2 diabetes mellitus with diabetic neuropathy, unspecified: Secondary | ICD-10-CM | POA: Diagnosis not present

## 2016-07-21 DIAGNOSIS — Z79899 Other long term (current) drug therapy: Secondary | ICD-10-CM | POA: Diagnosis not present

## 2016-07-21 DIAGNOSIS — I129 Hypertensive chronic kidney disease with stage 1 through stage 4 chronic kidney disease, or unspecified chronic kidney disease: Secondary | ICD-10-CM | POA: Diagnosis not present

## 2016-07-21 DIAGNOSIS — N182 Chronic kidney disease, stage 2 (mild): Secondary | ICD-10-CM | POA: Diagnosis not present

## 2016-08-21 ENCOUNTER — Other Ambulatory Visit: Payer: Self-pay | Admitting: Internal Medicine

## 2016-08-21 DIAGNOSIS — Z1231 Encounter for screening mammogram for malignant neoplasm of breast: Secondary | ICD-10-CM

## 2016-09-04 DIAGNOSIS — E1140 Type 2 diabetes mellitus with diabetic neuropathy, unspecified: Secondary | ICD-10-CM | POA: Diagnosis not present

## 2016-09-04 DIAGNOSIS — R252 Cramp and spasm: Secondary | ICD-10-CM | POA: Diagnosis not present

## 2016-09-04 DIAGNOSIS — Z79899 Other long term (current) drug therapy: Secondary | ICD-10-CM | POA: Diagnosis not present

## 2016-09-09 ENCOUNTER — Ambulatory Visit
Admission: RE | Admit: 2016-09-09 | Discharge: 2016-09-09 | Disposition: A | Discharge status: Home / Self Care | Payer: Medicare Other | Source: Ambulatory Visit | Attending: Internal Medicine | Admitting: Internal Medicine

## 2016-09-09 DIAGNOSIS — Z1231 Encounter for screening mammogram for malignant neoplasm of breast: Secondary | ICD-10-CM | POA: Diagnosis not present

## 2016-10-15 ENCOUNTER — Ambulatory Visit (INDEPENDENT_AMBULATORY_CARE_PROVIDER_SITE_OTHER): Payer: Medicare Other | Admitting: Podiatry

## 2016-10-15 ENCOUNTER — Encounter: Payer: Self-pay | Admitting: Podiatry

## 2016-10-15 VITALS — BP 162/93 | HR 60

## 2016-10-15 DIAGNOSIS — R52 Pain, unspecified: Secondary | ICD-10-CM

## 2016-10-15 DIAGNOSIS — M79676 Pain in unspecified toe(s): Secondary | ICD-10-CM | POA: Diagnosis not present

## 2016-10-15 DIAGNOSIS — E119 Type 2 diabetes mellitus without complications: Secondary | ICD-10-CM | POA: Diagnosis not present

## 2016-10-15 DIAGNOSIS — B351 Tinea unguium: Secondary | ICD-10-CM | POA: Diagnosis not present

## 2016-10-15 NOTE — Progress Notes (Addendum)
   Subjective:    Patient ID: [name] [name], female    DOB: [birth year], 66 y.o.   MRN: [id number]  HPI this patient presents the office with chief complaint of painful long thick nails. Patient states the nails are painful walking and wearing her shoes. This patient is a type II diabetic for over 10 years. She presents the office today for further evaluation and treatment. She says that she had fungal toenails which was previously treated successfully with medicine except for the second toe right foot    Review of Systems  HENT: Positive for ear pain.   Respiratory: Positive for cough and wheezing.   Cardiovascular: Positive for leg swelling.  Musculoskeletal:       Joint pain       Objective:   Physical Exam GENERAL APPEARANCE: Alert, conversant. Appropriately groomed. No acute distress.  VASCULAR: Pedal pulses are  palpable at  [facility] and PT bilateral.  Capillary refill time is immediate to all digits,  Normal temperature gradient.   NEUROLOGIC: sensation is normal to 5.07 monofilament at 5/5 sites bilateral.  Light touch is intact bilateral, Muscle strength normal.  MUSCULOSKELETAL: acceptable muscle strength, tone and stability bilateral.  HAV  B/L  DERMATOLOGIC: skin color, texture, and turgor are within normal limits.  No preulcerative lesions or ulcers  are seen, no interdigital maceration noted.  No open lesions present.  . No drainage noted.  NAILS  Thick disfigured discolored nails both feet especially second toenail right foot.         Assessment & Plan:  Onychomycosis  B/L  Diabetes  IE  Debride nails.  She gives a history of having heel pain upon rising in the morning. This patient needs to return to work so we told her to make a follow-up appointment. Solely for her heels   Helane Gunther DPM

## 2016-11-21 ENCOUNTER — Other Ambulatory Visit: Payer: Self-pay

## 2016-11-21 ENCOUNTER — Telehealth: Payer: Self-pay | Admitting: Cardiology

## 2016-11-21 NOTE — Telephone Encounter (Signed)
SENT NOTES TO [facility] BY CARRIER AND ALSO FAXED

## 2016-11-21 NOTE — Telephone Encounter (Signed)
11/21/2016 [facility] for upcoming appointment with Dr. Antoine PocheHochrein on 12/09/2016. [facility]  cbr

## 2016-12-08 NOTE — Progress Notes (Signed)
HPI The patient presents for follow up of CAD.  I saw her one year ago.  She had a history of a PCI in 2005.  She had a stress perfusion study in 2013 demonstrated no evidence of ischemia and well-preserved ejection fraction.  Since I last saw her she has been treated for hepatitis C.  She has had some mild dyspnea and some apparent wheezing at times at night.  She stopped smoking 3 years ago.  The patient denies any new symptoms such as chest discomfort, neck or arm discomfort. There has been no  PND or orthopnea. There have been no reported palpitations, presyncope or syncope.  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     Allergies  Allergen Reactions  . Latex Rash    Current Outpatient Prescriptions  Medication Sig Dispense Refill  . amLODipine (NORVASC) 5 MG tablet Take 1 tablet by mouth daily.  5  . aspirin 81 MG tablet Take 81 mg by mouth daily.    Marland Kitchen BIOTIN PO Take by mouth.    . Cholecalciferol (CVS VIT D 5000 HIGH-POTENCY PO) Take 1 tablet by mouth daily.     Marland Kitchen glucosamine-chondroitin 500-400 MG tablet Take 1 tablet by mouth daily. Reported on 12/17/2015    . Insulin Glargine (LANTUS SOLOSTAR) 100 UNIT/ML Solostar Pen Inject 30 Units into the skin daily at 10 pm. (Patient taking differently: Inject 15 Units into the skin daily at 10 pm. ) 15 mL 5  . JANUVIA 100 MG tablet     . metoprolol (LOPRESSOR) 50 MG tablet Take 0.5 tablets (25 mg total) by mouth 2 (two) times daily. 30 tablet 0  . ONETOUCH VERIO test strip     . rosuvastatin (CRESTOR) 10 MG tablet Take 10 mg by mouth daily.    . temazepam (RESTORIL) 15 MG capsule Take 15 mg by mouth at bedtime.     . vitamin C (ASCORBIC ACID) 500 MG tablet Take 500 mg by mouth daily.     No current facility-administered medications for this visit.     Past Medical History:  Diagnosis Date  . Anemia   . Carpal tunnel syndrome   . Coronary artery disease    DES to ostial RCA 2005  . Diabetes mellitus without  complication (HCC)    No longer taking meds  . Dyslipidemia   . Hypertension   . Osteoarthritis   . PAD (peripheral artery disease) (HCC)     Past Surgical History:  Procedure Laterality Date  . APPENDECTOMY    . TONSILLECTOMY    . WRIST SURGERY      ROS:  As stated in the HPI and negative for all other systems.  PHYSICAL EXAM BP (!) 128/92   Pulse 62   Ht 5\' 5"  (1.651 m)   Wt 190 lb (86.2 kg)   BMI 31.62 kg/m   GENERAL:  Well appearing NECK:  No jugular venous distention, waveform within normal limits, carotid upstroke brisk and symmetric, no bruits, no thyromegaly LUNGS:  Clear to auscultation bilaterally BACK:  No CVA tenderness CHEST:  Unremarkable HEART:  PMI not displaced or sustained,S1 and S2 within normal limits, no S3, no S4, no clicks, no rubs, no murmurs ABD:  Flat, positive bowel sounds normal in frequency in pitch, no bruits, no rebound, no guarding, no midline pulsatile mass, no hepatomegaly, no splenomegaly EXT:  2 plus pulses throughout, no edema, no cyanosis no clubbing    EKG:  Sinus rhythm,  rate 62 , axis within normal limits, intervals within normal limits, poor anterior R wave progression, no acute ST-T wave changes area  12/10/2016   ASSESSMENT AND PLAN  CAD  She does have some dyspnea.  I will bring the patient back for a POET (Plain Old Exercise Test). This will allow me to screen for obstructive coronary disease, risk stratify and very importantly provide a prescription for exercise.  Dyslipidemia/DM - This is followed by Dorothyann PengSanders, Robyn, MD.  Her A1C was slightly increased at 7.1 in Jan.   However, she did have her meds adjusted.  LDL was 78 with an HDL of 52.  This is a good ratio.    Hypertension - The blood pressure is at target. No change in medications is indicated. We will continue with therapeutic lifestyle changes (TLC).  Tobacco smoke - She is  still not smoking (three years)!!!

## 2016-12-09 ENCOUNTER — Encounter: Payer: Self-pay | Admitting: Cardiology

## 2016-12-09 ENCOUNTER — Ambulatory Visit (INDEPENDENT_AMBULATORY_CARE_PROVIDER_SITE_OTHER): Payer: Medicare Other | Admitting: Cardiology

## 2016-12-09 VITALS — BP 128/92 | HR 62 | Ht 65.0 in | Wt 190.0 lb

## 2016-12-09 DIAGNOSIS — R0609 Other forms of dyspnea: Secondary | ICD-10-CM | POA: Diagnosis not present

## 2016-12-09 DIAGNOSIS — I25119 Atherosclerotic heart disease of native coronary artery with unspecified angina pectoris: Secondary | ICD-10-CM | POA: Diagnosis not present

## 2016-12-09 DIAGNOSIS — I10 Essential (primary) hypertension: Secondary | ICD-10-CM | POA: Diagnosis not present

## 2016-12-09 DIAGNOSIS — E785 Hyperlipidemia, unspecified: Secondary | ICD-10-CM

## 2016-12-09 NOTE — Patient Instructions (Signed)
Medication Instructions:  Continue current medications  Labwork: None Ordered  Testing/Procedures: Your physician has requested that you have an exercise tolerance test. For further information please visit [email or web address]. Please also follow instruction sheet, as given.   Follow-Up: Your physician wants you to follow-up in: 1 Year. You will receive a reminder letter in the mail two months in advance. If you don't receive a letter, please call our office to schedule the follow-up appointment.   Any Other Special Instructions Will Be Listed Below (If Applicable).   If you need a refill on your cardiac medications before your next appointment, please call your pharmacy.   

## 2016-12-10 ENCOUNTER — Encounter: Payer: Self-pay | Admitting: Cardiology

## 2016-12-10 DIAGNOSIS — R0609 Other forms of dyspnea: Secondary | ICD-10-CM

## 2016-12-11 DIAGNOSIS — E1140 Type 2 diabetes mellitus with diabetic neuropathy, unspecified: Secondary | ICD-10-CM | POA: Diagnosis not present

## 2016-12-11 DIAGNOSIS — Z1389 Encounter for screening for other disorder: Secondary | ICD-10-CM | POA: Diagnosis not present

## 2016-12-11 DIAGNOSIS — I1310 Hypertensive heart and chronic kidney disease without heart failure, with stage 1 through stage 4 chronic kidney disease, or unspecified chronic kidney disease: Secondary | ICD-10-CM | POA: Diagnosis not present

## 2016-12-11 DIAGNOSIS — N182 Chronic kidney disease, stage 2 (mild): Secondary | ICD-10-CM | POA: Diagnosis not present

## 2016-12-11 DIAGNOSIS — I739 Peripheral vascular disease, unspecified: Secondary | ICD-10-CM | POA: Diagnosis not present

## 2017-01-07 ENCOUNTER — Telehealth (HOSPITAL_COMMUNITY): Payer: Self-pay

## 2017-01-07 NOTE — Telephone Encounter (Signed)
Encounter complete. 

## 2017-01-09 ENCOUNTER — Ambulatory Visit (HOSPITAL_COMMUNITY)
Admission: RE | Admit: 2017-01-09 | Payer: Medicare Other | Source: Ambulatory Visit | Attending: Cardiology | Admitting: Cardiology

## 2017-01-14 ENCOUNTER — Ambulatory Visit: Payer: Medicare Other | Admitting: Podiatry

## 2017-01-21 DIAGNOSIS — Z0000 Encounter for general adult medical examination without abnormal findings: Secondary | ICD-10-CM | POA: Diagnosis not present

## 2017-01-21 DIAGNOSIS — E1140 Type 2 diabetes mellitus with diabetic neuropathy, unspecified: Secondary | ICD-10-CM | POA: Diagnosis not present

## 2017-01-21 DIAGNOSIS — N182 Chronic kidney disease, stage 2 (mild): Secondary | ICD-10-CM | POA: Diagnosis not present

## 2017-01-21 DIAGNOSIS — I1310 Hypertensive heart and chronic kidney disease without heart failure, with stage 1 through stage 4 chronic kidney disease, or unspecified chronic kidney disease: Secondary | ICD-10-CM | POA: Diagnosis not present

## 2017-01-21 DIAGNOSIS — E559 Vitamin D deficiency, unspecified: Secondary | ICD-10-CM | POA: Diagnosis not present

## 2017-01-22 ENCOUNTER — Encounter: Payer: Self-pay | Admitting: Podiatry

## 2017-01-22 ENCOUNTER — Ambulatory Visit (INDEPENDENT_AMBULATORY_CARE_PROVIDER_SITE_OTHER): Payer: Medicare Other | Admitting: Podiatry

## 2017-01-22 ENCOUNTER — Ambulatory Visit (INDEPENDENT_AMBULATORY_CARE_PROVIDER_SITE_OTHER): Payer: Medicare Other

## 2017-01-22 DIAGNOSIS — M722 Plantar fascial fibromatosis: Secondary | ICD-10-CM

## 2017-01-22 MED ORDER — TRIAMCINOLONE ACETONIDE 10 MG/ML IJ SUSP
10.0000 mg | Freq: Once | INTRAMUSCULAR | Status: AC
  Administered 2017-01-22: 10 mg

## 2017-01-23 ENCOUNTER — Ambulatory Visit (HOSPITAL_COMMUNITY)
Admission: RE | Admit: 2017-01-23 | Discharge: 2017-01-23 | Disposition: A | Discharge status: Home / Self Care | Payer: Medicare Other | Source: Ambulatory Visit | Attending: Cardiovascular Disease | Admitting: Cardiovascular Disease

## 2017-01-23 DIAGNOSIS — R0609 Other forms of dyspnea: Secondary | ICD-10-CM

## 2017-01-23 DIAGNOSIS — I25119 Atherosclerotic heart disease of native coronary artery with unspecified angina pectoris: Secondary | ICD-10-CM

## 2017-01-23 NOTE — Progress Notes (Signed)
Subjective:    Patient ID: Oris DroneLinda Teixeira, female   DOB: 66 y.o.   MRN: [id number]   HPI patient presents complaining of significant heel pain over the last several months of both feet stating it's worse when she gets up in the morning and after periods of sitting    ROS      Objective:  Physical Exam neurovascular status intact muscle strength adequate with exquisite discomfort plantar heel region bilateral with inflammation and fluid around the medial band at the insertion calcaneus     Assessment:   Acute plantar fasciitis bilateral      Plan:    H&P x-ray reviewed and today injected the plantar fascial bilateral 3 mg Kenalog 5 mg Xylocaine and instructed on physical therapy supportive shoes and reappoint as needed  X-ray indicates there is spur but no indications of stress fracture arthritis

## 2017-02-04 ENCOUNTER — Telehealth (HOSPITAL_COMMUNITY): Payer: Self-pay

## 2017-02-04 NOTE — Telephone Encounter (Signed)
Encounter complete. 

## 2017-02-05 ENCOUNTER — Telehealth (HOSPITAL_COMMUNITY): Payer: Self-pay

## 2017-02-05 NOTE — Telephone Encounter (Signed)
Awaiting return call at this time.  Encounter complete. 

## 2017-02-06 ENCOUNTER — Ambulatory Visit (HOSPITAL_COMMUNITY)
Admission: RE | Admit: 2017-02-06 | Discharge: 2017-02-06 | Disposition: A | Discharge status: Home / Self Care | Payer: Medicare Other | Source: Ambulatory Visit | Attending: Cardiovascular Disease | Admitting: Cardiovascular Disease

## 2017-02-06 DIAGNOSIS — R0609 Other forms of dyspnea: Secondary | ICD-10-CM | POA: Insufficient documentation

## 2017-02-06 DIAGNOSIS — I25119 Atherosclerotic heart disease of native coronary artery with unspecified angina pectoris: Secondary | ICD-10-CM | POA: Diagnosis not present

## 2017-02-06 LAB — EXERCISE TOLERANCE TEST
CHL RATE OF PERCEIVED EXERTION: 18
CSEPED: 5 min
CSEPEDS: 13 s
Estimated workload: 7 METS
MPHR: 154 {beats}/min
Peak HR: 104 {beats}/min
Percent HR: 67 %
Rest HR: 54 {beats}/min

## 2017-03-03 DIAGNOSIS — I2510 Atherosclerotic heart disease of native coronary artery without angina pectoris: Secondary | ICD-10-CM | POA: Diagnosis not present

## 2017-03-03 DIAGNOSIS — E119 Type 2 diabetes mellitus without complications: Secondary | ICD-10-CM | POA: Diagnosis not present

## 2017-03-03 DIAGNOSIS — Z800 Family history of malignant neoplasm of digestive organs: Secondary | ICD-10-CM | POA: Diagnosis not present

## 2017-04-23 DIAGNOSIS — I739 Peripheral vascular disease, unspecified: Secondary | ICD-10-CM | POA: Diagnosis not present

## 2017-04-23 DIAGNOSIS — E1140 Type 2 diabetes mellitus with diabetic neuropathy, unspecified: Secondary | ICD-10-CM | POA: Diagnosis not present

## 2017-04-23 DIAGNOSIS — N182 Chronic kidney disease, stage 2 (mild): Secondary | ICD-10-CM | POA: Diagnosis not present

## 2017-04-23 DIAGNOSIS — I1310 Hypertensive heart and chronic kidney disease without heart failure, with stage 1 through stage 4 chronic kidney disease, or unspecified chronic kidney disease: Secondary | ICD-10-CM | POA: Diagnosis not present

## 2017-04-23 DIAGNOSIS — M2550 Pain in unspecified joint: Secondary | ICD-10-CM | POA: Diagnosis not present

## 2017-04-23 DIAGNOSIS — I129 Hypertensive chronic kidney disease with stage 1 through stage 4 chronic kidney disease, or unspecified chronic kidney disease: Secondary | ICD-10-CM | POA: Diagnosis not present

## 2017-04-23 DIAGNOSIS — E1122 Type 2 diabetes mellitus with diabetic chronic kidney disease: Secondary | ICD-10-CM | POA: Diagnosis not present

## 2017-04-23 DIAGNOSIS — Z79899 Other long term (current) drug therapy: Secondary | ICD-10-CM | POA: Diagnosis not present

## 2017-05-21 DIAGNOSIS — Z800 Family history of malignant neoplasm of digestive organs: Secondary | ICD-10-CM | POA: Diagnosis not present

## 2017-05-21 DIAGNOSIS — K640 First degree hemorrhoids: Secondary | ICD-10-CM | POA: Diagnosis not present

## 2017-05-21 DIAGNOSIS — K5730 Diverticulosis of large intestine without perforation or abscess without bleeding: Secondary | ICD-10-CM | POA: Diagnosis not present

## 2017-06-18 DIAGNOSIS — H9190 Unspecified hearing loss, unspecified ear: Secondary | ICD-10-CM | POA: Diagnosis not present

## 2017-06-18 DIAGNOSIS — G894 Chronic pain syndrome: Secondary | ICD-10-CM | POA: Diagnosis not present

## 2017-06-18 DIAGNOSIS — M25532 Pain in left wrist: Secondary | ICD-10-CM | POA: Diagnosis not present

## 2017-06-18 DIAGNOSIS — E1140 Type 2 diabetes mellitus with diabetic neuropathy, unspecified: Secondary | ICD-10-CM | POA: Diagnosis not present

## 2017-08-19 DIAGNOSIS — Z79899 Other long term (current) drug therapy: Secondary | ICD-10-CM | POA: Diagnosis not present

## 2017-08-19 DIAGNOSIS — M2550 Pain in unspecified joint: Secondary | ICD-10-CM | POA: Diagnosis not present

## 2017-08-19 DIAGNOSIS — E1140 Type 2 diabetes mellitus with diabetic neuropathy, unspecified: Secondary | ICD-10-CM | POA: Diagnosis not present

## 2017-08-19 DIAGNOSIS — N182 Chronic kidney disease, stage 2 (mild): Secondary | ICD-10-CM | POA: Diagnosis not present

## 2017-08-19 DIAGNOSIS — I129 Hypertensive chronic kidney disease with stage 1 through stage 4 chronic kidney disease, or unspecified chronic kidney disease: Secondary | ICD-10-CM | POA: Diagnosis not present

## 2017-08-27 ENCOUNTER — Other Ambulatory Visit: Payer: Self-pay | Admitting: Internal Medicine

## 2017-08-27 DIAGNOSIS — Z1231 Encounter for screening mammogram for malignant neoplasm of breast: Secondary | ICD-10-CM

## 2017-09-18 ENCOUNTER — Ambulatory Visit
Admission: RE | Admit: 2017-09-18 | Discharge: 2017-09-18 | Disposition: A | Discharge status: Home / Self Care | Payer: Medicare Other | Source: Ambulatory Visit | Attending: Internal Medicine | Admitting: Internal Medicine

## 2017-09-18 DIAGNOSIS — Z1231 Encounter for screening mammogram for malignant neoplasm of breast: Secondary | ICD-10-CM

## 2017-09-23 NOTE — Progress Notes (Signed)
Office Visit Note  Patient: [name] [name]             Date of Birth: [birth year]           MRN: [id number]             PCP: Dorothyann PengSanders, Robyn, MD Referring: Dorothyann PengSanders, Robyn, MD Visit Date: 10/06/2017 Occupation: @GUAROCC @    Subjective:  Pain and Numbness of the Left Hand and New Patient (Initial Visit) (left hand pain and swelling)   History of Present Illness: [name] [name] is a [age] y.o. female seen in consultation per request of her PCP.  According to patient her symptoms started in 2006 with pain in her bilateral CMC joints.  She states the pain had been so severe and difficult to tolerate that she had to go on disability.  Her left CMC was more painful than the right.  Over time she has developed pain in her both knee joints.  She has more pain in the right than the left knee.  She has noticed swelling in her ankles.  She is experiencing some generalized achiness which she describes over her upper extremities and lower extremities she also has burning sensation on her skin.  She denies any history of sicca symptoms.  She is concerned as her daughter has been diagnosed with lupus.  She also has family history of Crohn's disease and multiple sclerosis.  Activities of Daily Living:  Patient reports morning stiffness for 10  minutes.   Patient Reports nocturnal pain.  Difficulty dressing/grooming: Denies Difficulty climbing stairs: Reports Difficulty getting out of chair: Reports Difficulty using hands for taps, buttons, cutlery, and/or writing: Reports   Review of Systems  Constitutional: Positive for fatigue. Negative for night sweats, weight gain and weight loss.  HENT: Negative for ear pain, mouth sores, trouble swallowing, trouble swallowing, mouth dryness and nose dryness.   Eyes: Negative for pain, redness, visual disturbance and dryness.  Respiratory: Negative for cough, shortness of breath and difficulty breathing.   Cardiovascular: Positive for swelling in legs/feet. Negative  for chest pain, palpitations, hypertension and irregular heartbeat.  Gastrointestinal: Positive for constipation. Negative for blood in stool and diarrhea.  Endocrine: Negative for increased urination.  Genitourinary: Negative for difficulty urinating and vaginal dryness.  Musculoskeletal: Positive for arthralgias, joint pain, joint swelling, myalgias, muscle weakness, morning stiffness and myalgias. Negative for muscle tenderness.  Skin: Positive for hair loss. Negative for color change, rash, skin tightness, ulcers and sensitivity to sunlight.  Allergic/Immunologic: Negative for susceptible to infections.  Neurological: Negative for dizziness, numbness, memory loss, night sweats and weakness.  Hematological: Negative for bruising/bleeding tendency and swollen glands.  Psychiatric/Behavioral: Positive for sleep disturbance. Negative for depressed mood. The patient is not nervous/anxious.     PMFS History:  Patient Active Problem List   Diagnosis Date Noted  . Dyspnea on exertion 12/10/2016  . Uncontrolled diabetes mellitus type 2 without complications (HCC) 09/21/2014  . Acute cystitis 09/21/2014  . Hyperglycemia 09/19/2014  . Bradycardia 09/19/2014  . Hyponatremia 09/19/2014  . Elevated alkaline phosphatase level 09/19/2014  . Acute kidney injury (HCC)   . Essential hypertension, benign 07/05/2008  . Coronary atherosclerosis 07/05/2008  . TOBACCO USER 07/03/2008    Past Medical History:  Diagnosis Date  . Anemia   . Carpal tunnel syndrome   . Coronary artery disease    DES to ostial RCA 2005  . Diabetes mellitus without complication (HCC)    No longer taking meds  . Dyslipidemia   . Hypertension   .  Osteoarthritis   . PAD (peripheral artery disease) (HCC)     Family History  Problem Relation Age of Onset  . CAD Father 95  . CAD Mother 1  . CAD Brother   . Cancer Brother   . CAD Sister   . CAD Brother    Past Surgical History:  Procedure Laterality Date  .  APPENDECTOMY    . TONSILLECTOMY    . WRIST SURGERY     Social History   Social History Narrative   Lives alone.   One son died in a car accident.       Objective: Vital Signs: BP 131/75 (BP Location: Left Arm, Patient Position: Sitting, Cuff Size: Large)   Pulse (!) 52   Ht 5\' 4"  (1.626 m)   Wt 190 lb (86.2 kg)   BMI 32.61 kg/m    Physical Exam  Constitutional: She is oriented to person, place, and time. She appears well-developed and well-nourished.  HENT:  Head: Normocephalic and atraumatic.  Eyes: Conjunctivae and EOM are normal.  Neck: Normal range of motion.  Cardiovascular: Normal rate, regular rhythm, normal heart sounds and intact distal pulses.  Pulmonary/Chest: Effort normal and breath sounds normal.  Abdominal: Soft. Bowel sounds are normal.  Lymphadenopathy:    She has no cervical adenopathy.  Neurological: She is alert and oriented to person, place, and time.  Skin: Skin is warm and dry. Capillary refill takes less than 2 seconds.  Psychiatric: She has a normal mood and affect. Her behavior is normal.  Nursing note and vitals reviewed.    Musculoskeletal Exam: C-spine thoracic lumbar spine good range of motion.  No SI joint tenderness was noted.  Shoulder joints, joints, wrist joints good range of motion.  She has good fist formation bilaterally.  She has prominence of bilateral CMC joint with some tenderness.  Hip joints knee joints ankles MTPs PIPs with good range of motion.  She has some crepitus in the knee joints without any warmth swelling or effusion.  She has some generalized hyperalgesia.  CDAI Exam: No CDAI exam completed.    Investigation: No additional findings.  labs 04/23/17: sed rate 25, uric acid 4, RF 15, ANA +, Ro + La -, smith -, RNP -, dsDNA-, CCP - CBC Latest Ref Rng & Units 09/20/2014 09/19/2014 09/19/2014  WBC 4.0 - 10.5 K/uL 6.2 6.3 5.0  Hemoglobin 12.0 - 15.0 g/dL 10.6(L) 11.7(L) 11.6(L)  Hematocrit 36.0 - 46.0 % 32.9(L) 36.0 36.0    Platelets 150 - 400 K/uL 199 250 225   CMP Latest Ref Rng & Units 09/20/2014 09/19/2014 09/19/2014  Glucose 70 - 99 mg/dL 782(N) 562(Z) -  BUN 6 - 23 mg/dL 15 11 -  Creatinine 3.08 - 1.10 mg/dL 6.57 8.46 9.62  Sodium 135 - 145 mmol/L 135 140 -  Potassium 3.5 - 5.1 mmol/L 4.0 3.4(L) -  Chloride 96 - 112 mmol/L 99 100 -  CO2 19 - 32 mmol/L 29 30 -  Calcium 8.4 - 10.5 mg/dL 8.9 9.7 -  Total Protein 6.0 - 8.3 g/dL - - -  Total Bilirubin 0.3 - 1.2 mg/dL - - -  Alkaline Phos 39 - 117 U/L - - -  AST 0 - 37 U/L - - -  ALT 0 - 35 U/L - - -    Imaging: Mm Screening Breast Tomo Bilateral  Result Date: 09/21/2017 CLINICAL DATA:  Screening. EXAM: DIGITAL SCREENING BILATERAL MAMMOGRAM WITH TOMO AND CAD COMPARISON:  Previous exam(s). ACR Breast Density Category c:  The breast tissue is heterogeneously dense, which may obscure small masses. FINDINGS: There are no findings suspicious for malignancy. Images were processed with CAD. IMPRESSION: No mammographic evidence of malignancy. A result letter of this screening mammogram will be mailed directly to the patient. RECOMMENDATION: Screening mammogram in one year. (Code:SM-B-01Y) BI-RADS CATEGORY  1: Negative. Electronically Signed   By: Annia Belt M.D.   On: 09/21/2017 07:49   Xr Hand 2 View Left  Result Date: 10/06/2017 Severe CMC narrowing with calcification around the joint was noted.  PIP and DIP narrowing was noted.  No MCP, intercarpal radiocarpal narrowing was noted.  Noticed the changes were noted. Impression: These findings are consistent with severe CMC arthropathy and osteoarthritis of the hand.  Xr Hand 2 View Right  Result Date: 10/06/2017 Mild spurring in the [facility] joint was noted.  PIP and DIP joint narrowing was noted.  No significant MCP intercarpal radiocarpal joint space narrowing or erosive changes were noted. Impression: These findings are consistent with osteoarthritis of the hand.  Xr Knee 3 View Left  Result Date:  10/06/2017 Moderate to severe medial compartment narrowing with medial and lateral osteophytes were noted.  Intercondylar osteophytes were noted.  No chondrocalcinosis was noted.  Moderate patellofemoral narrowing was noted. Impression: These findings are consistent with moderate osteoarthritis and moderate chondromalacia patella.  Xr Knee 3 View Right  Result Date: 10/06/2017 Severe medial compartment narrowing with medial osteophytes and moderate medial lateral compartment narrowing with intercondylar osteophytes was noted.  No chondrocalcinosis was noted.  Severe patellofemoral narrowing was noted. Impression: These findings are consistent with severe osteoarthritis and severe chondromalacia patella.   Speciality Comments: No specialty comments available.    Procedures:  No procedures performed Allergies: Latex   Assessment / Plan:     Visit Diagnoses: Polyarthralgia - labs 04/23/17: sed rate 25, uric acid 4, RF 15, ANA +, Ro +La -, smith -, RNP -, dsDNA-, CCP -.  Patient has no clinical features of autoimmune disease on examination.  There is positive family history of lupus in her sister.  She also gives family history of Crohn's disease and multiple sclerosis.  Pain in both hands -she continues to have pain and discomfort in her bilateral hands especially her CMC joints.  No synovitis was noted on examination.  Plan: XR Hand 2 View Right, XR Hand 2 View Left.  The x-rays revealed severe right CMC arthritis with calcification.  Mild osteoarthritic changes in both hands were noted otherwise.  Joint protection muscle strengthening was discussed.  A handout on hand and knee exercises was given.  A list of natural anti-inflammatories was given.  She would benefit from a left CMC brace.  A prescription for CMC brace was given.  Chronic pain of both knees -she has ongoing pain and discomfort in the bilateral knee joints.  She is some crepitus of knee examination but no warmth swelling or effusion  was noted.  Plan: XR KNEE 3 VIEW RIGHT, XR KNEE 3 VIEW LEFT.  She has moderate to severe osteoarthritis in bilateral knee joints.  Worse in the right knee than the left.  She has moderate to severe patellofemoral narrowing.  I have given her a handout of knee exercises.  We discussed the option of Visco supplement injections as she is diabetic she cannot do cortisone injections.  Total knee replacement was also discussed with patient.  I can she declined Visco supplement injections and total knee replacement at this time.  Weight loss diet and exercise was  discussed.  Essential hypertension, benign: Appears to be controlled.  History of diabetes mellitus, type II  Vitamin D deficiency: She is on supplement.  Other insomnia: Good sleep hygiene was discussed.  Chronic kidney disease, stage 2 (mild)  Peripheral arterial disease (HCC)    Orders: Orders Placed This Encounter  Procedures  . XR Hand 2 View Right  . XR Hand 2 View Left  . XR KNEE 3 VIEW RIGHT  . XR KNEE 3 VIEW LEFT   No orders of the defined types were placed in this encounter.   Face-to-face time spent with patient was 50 minutes. Greater than 50% of time was spent in counseling and coordination of care.  Follow-Up Instructions: Return for polyarthralgia.   Pollyann Savoy, MD  Note - This record has been created using Animal nutritionist.  Chart creation errors have been sought, but may not always  have been located. Such creation errors do not reflect on  the standard of medical care.

## 2017-10-06 ENCOUNTER — Ambulatory Visit (INDEPENDENT_AMBULATORY_CARE_PROVIDER_SITE_OTHER): Payer: Medicare Other | Admitting: Rheumatology

## 2017-10-06 ENCOUNTER — Ambulatory Visit (INDEPENDENT_AMBULATORY_CARE_PROVIDER_SITE_OTHER): Payer: Self-pay

## 2017-10-06 ENCOUNTER — Encounter: Payer: Self-pay | Admitting: Rheumatology

## 2017-10-06 VITALS — BP 131/75 | HR 52 | Ht 64.0 in | Wt 190.0 lb

## 2017-10-06 DIAGNOSIS — G8929 Other chronic pain: Secondary | ICD-10-CM

## 2017-10-06 DIAGNOSIS — N182 Chronic kidney disease, stage 2 (mild): Secondary | ICD-10-CM

## 2017-10-06 DIAGNOSIS — M79642 Pain in left hand: Secondary | ICD-10-CM

## 2017-10-06 DIAGNOSIS — M79641 Pain in right hand: Secondary | ICD-10-CM | POA: Diagnosis not present

## 2017-10-06 DIAGNOSIS — I739 Peripheral vascular disease, unspecified: Secondary | ICD-10-CM | POA: Diagnosis not present

## 2017-10-06 DIAGNOSIS — G4709 Other insomnia: Secondary | ICD-10-CM | POA: Diagnosis not present

## 2017-10-06 DIAGNOSIS — I10 Essential (primary) hypertension: Secondary | ICD-10-CM

## 2017-10-06 DIAGNOSIS — M25561 Pain in right knee: Secondary | ICD-10-CM | POA: Diagnosis not present

## 2017-10-06 DIAGNOSIS — M2550 Pain in unspecified joint: Secondary | ICD-10-CM

## 2017-10-06 DIAGNOSIS — E559 Vitamin D deficiency, unspecified: Secondary | ICD-10-CM

## 2017-10-06 DIAGNOSIS — M25562 Pain in left knee: Secondary | ICD-10-CM | POA: Diagnosis not present

## 2017-10-06 DIAGNOSIS — Z8639 Personal history of other endocrine, nutritional and metabolic disease: Secondary | ICD-10-CM

## 2017-10-06 NOTE — Patient Instructions (Signed)
Natural anti-inflammatories  You can purchase these at [employer], [employer] or online.  . Turmeric (capsules)  . Ginger (ginger root or capsules)  . Omega 3 (Fish, flax seeds, chia seeds, walnuts, almonds)  . Tart cherry (dried or extract)   Patient should be under the care of a physician while taking these supplements. This may not be reproduced without the permission of Dr. Shaindel Sweeten.  Hand Exercises Hand exercises can be helpful to almost anyone. These exercises can strengthen the hands, improve flexibility and movement, and increase blood flow to the hands. These results can make work and daily tasks easier. Hand exercises can be especially helpful for people who have joint pain from arthritis or have nerve damage from overuse (carpal tunnel syndrome). These exercises can also help people who have injured a hand. Most of these hand exercises are fairly gentle stretching routines. You can do them often throughout the day. Still, it is a good idea to ask your health care provider which exercises would be best for you. Warming your hands before exercise may help to reduce stiffness. You can do this with gentle massage or by placing your hands in warm water for 15 minutes. Also, make sure you pay attention to your level of hand pain as you begin an exercise routine. Exercises Knuckle Bend Repeat this exercise 5-10 times with each hand. 1. Stand or sit with your arm, hand, and all five fingers pointed straight up. Make sure your wrist is straight. 2. Gently and slowly bend your fingers down and inward until the tips of your fingers are touching the tops of your palm. 3. Hold this position for a few seconds. 4. Extend your fingers out to their original position, all pointing straight up again.  Finger Fan Repeat this exercise 5-10 times with each hand. 1. Hold your arm and hand out in front of you. Keep your wrist straight. 2. Squeeze your hand into a fist. 3. Hold this  position for a few seconds. 4. Fan out, or spread apart, your hand and fingers as much as possible, stretching every joint fully.  Tabletop Repeat this exercise 5-10 times with each hand. 1. Stand or sit with your arm, hand, and all five fingers pointed straight up. Make sure your wrist is straight. 2. Gently and slowly bend your fingers at the knuckles where they meet the hand until your hand is making an upside-down L shape. Your fingers should form a tabletop. 3. Hold this position for a few seconds. 4. Extend your fingers out to their original position, all pointing straight up again.  Making Os Repeat this exercise 5-10 times with each hand. 1. Stand or sit with your arm, hand, and all five fingers pointed straight up. Make sure your wrist is straight. 2. Make an O shape by touching your pointer finger to your thumb. Hold for a few seconds. Then open your hand wide. 3. Repeat this motion with each finger on your hand.  Table Spread Repeat this exercise 5-10 times with each hand. 1. Place your hand on a table with your palm facing down. Make sure your wrist is straight. 2. Spread your fingers out as much as possible. Hold this position for a few seconds. 3. Slide your fingers back together again. Hold for a few seconds.  Ball Grip  Repeat this exercise 10-15 times with each hand. 1. Hold a tennis ball or another soft ball in your hand. 2. While slowly increasing pressure, squeeze the ball as hard as   possible. 3. Squeeze as hard as you can for 3-5 seconds. 4. Relax and repeat.  Wrist Curls Repeat this exercise 10-15 times with each hand. 1. Sit in a chair that has armrests. 2. Hold a light weight in your hand, such as a dumbbell that weighs 1-3 pounds (0.5-1.4 kg). Ask your health care provider what weight would be best for you. 3. Rest your hand just over the end of the chair arm with your palm facing up. 4. Gently pivot your wrist up and down while holding the weight. Do not  twist your wrist from side to side.  Contact a health care provider if:  Your hand pain or discomfort gets much worse when you do an exercise.  Your hand pain or discomfort does not improve within 2 hours after you exercise. If you have any of these problems, stop doing these exercises right away. Do not do them again unless your health care provider says that you can. Get help right away if:  You develop sudden, severe hand pain. If this happens, stop doing these exercises right away. Do not do them again unless your health care provider says that you can. This information is not intended to replace advice given to you by your health care provider. Make sure you discuss any questions you have with your health care provider. Document Released: 05/21/2015 Document Revised: 11/15/2015 Document Reviewed: 12/18/2014 Elsevier Interactive Patient Education  2018 [employer]  Knee Exercises Ask your health care provider which exercises are safe for you. Do exercises exactly as told by your health care provider and adjust them as directed. It is normal to feel mild stretching, pulling, tightness, or discomfort as you do these exercises, but you should stop right away if you feel sudden pain or your pain gets worse.Do not begin these exercises until told by your health care provider. STRETCHING AND RANGE OF MOTION EXERCISES These exercises warm up your muscles and joints and improve the movement and flexibility of your knee. These exercises also help to relieve pain, numbness, and tingling. Exercise A: Knee Extension, Prone 1. Lie on your abdomen on a bed. 2. Place your left / right knee just beyond the edge of the surface so your knee is not on the bed. You can put a towel under your left / right thigh just above your knee for comfort. 3. Relax your leg muscles and allow gravity to straighten your knee. You should feel a stretch behind your left / right knee. 4. Hold this position for __________  seconds. 5. Scoot up so your knee is supported between repetitions. Repeat __________ times. Complete this stretch __________ times a day. Exercise B: Knee Flexion, Active  1. Lie on your back with both knees straight. If this causes back discomfort, bend your left / right knee so your foot is flat on the floor. 2. Slowly slide your left / right heel back toward your buttocks until you feel a gentle stretch in the front of your knee or thigh. 3. Hold this position for __________ seconds. 4. Slowly slide your left / right heel back to the starting position. Repeat __________ times. Complete this exercise __________ times a day. Exercise C: Quadriceps, Prone  1. Lie on your abdomen on a firm surface, such as a bed or padded floor. 2. Bend your left / right knee and hold your ankle. If you cannot reach your ankle or pant leg, loop a belt around your foot and grab the belt instead. 3. Gently pull   your heel toward your buttocks. Your knee should not slide out to the side. You should feel a stretch in the front of your thigh and knee. 4. Hold this position for __________ seconds. Repeat __________ times. Complete this stretch __________ times a day. Exercise D: Hamstring, Supine 1. Lie on your back. 2. Loop a belt or towel over the ball of your left / right foot. The ball of your foot is on the walking surface, right under your toes. 3. Straighten your left / right knee and slowly pull on the belt to raise your leg until you feel a gentle stretch behind your knee. ? Do not let your left / right knee bend while you do this. ? Keep your other leg flat on the floor. 4. Hold this position for __________ seconds. Repeat __________ times. Complete this stretch __________ times a day. STRENGTHENING EXERCISES These exercises build strength and endurance in your knee. Endurance is the ability to use your muscles for a long time, even after they get tired. Exercise E: Quadriceps, Isometric  1. Lie on  your back with your left / right leg extended and your other knee bent. Put a rolled towel or small pillow under your knee if told by your health care provider. 2. Slowly tense the muscles in the front of your left / right thigh. You should see your kneecap slide up toward your hip or see increased dimpling just above the knee. This motion will push the back of the knee toward the floor. 3. For __________ seconds, keep the muscle as tight as you can without increasing your pain. 4. Relax the muscles slowly and completely. Repeat __________ times. Complete this exercise __________ times a day. Exercise F: Straight Leg Raises - Quadriceps 1. Lie on your back with your left / right leg extended and your other knee bent. 2. Tense the muscles in the front of your left / right thigh. You should see your kneecap slide up or see increased dimpling just above the knee. Your thigh may even shake a bit. 3. Keep these muscles tight as you raise your leg 4-6 inches (10-15 cm) off the floor. Do not let your knee bend. 4. Hold this position for __________ seconds. 5. Keep these muscles tense as you lower your leg. 6. Relax your muscles slowly and completely after each repetition. Repeat __________ times. Complete this exercise __________ times a day. Exercise G: Hamstring, Isometric 1. Lie on your back on a firm surface. 2. Bend your left / right knee approximately __________ degrees. 3. Dig your left / right heel into the surface as if you are trying to pull it toward your buttocks. Tighten the muscles in the back of your thighs to dig as hard as you can without increasing any pain. 4. Hold this position for __________ seconds. 5. Release the tension gradually and allow your muscles to relax completely for __________ seconds after each repetition. Repeat __________ times. Complete this exercise __________ times a day. Exercise H: Hamstring Curls  If told by your health care provider, do this exercise while  wearing ankle weights. Begin with __________ weights. Then increase the weight by 1 lb (0.5 kg) increments. Do not wear ankle weights that are more than __________. 1. Lie on your abdomen with your legs straight. 2. Bend your left / right knee as far as you can without feeling pain. Keep your hips flat against the floor. 3. Hold this position for __________ seconds. 4. Slowly lower your leg to the   starting position.  Repeat __________ times. Complete this exercise __________ times a day. Exercise I: Squats (Quadriceps) 1. Stand in front of a table, with your feet and knees pointing straight ahead. You may rest your hands on the table for balance but not for support. 2. Slowly bend your knees and lower your hips like you are going to sit in a chair. ? Keep your weight over your heels, not over your toes. ? Keep your lower legs upright so they are parallel with the table legs. ? Do not let your hips go lower than your knees. ? Do not bend lower than told by your health care provider. ? If your knee pain increases, do not bend as low. 3. Hold the squat position for __________ seconds. 4. Slowly push with your legs to return to standing. Do not use your hands to pull yourself to standing. Repeat __________ times. Complete this exercise __________ times a day. Exercise J: Wall Slides (Quadriceps)  1. Lean your back against a smooth wall or door while you walk your feet out 18-24 inches (46-61 cm) from it. 2. Place your feet hip-width apart. 3. Slowly slide down the wall or door until your knees bend __________ degrees. Keep your knees over your heels, not over your toes. Keep your knees in line with your hips. 4. Hold for __________ seconds. Repeat __________ times. Complete this exercise __________ times a day. Exercise K: Straight Leg Raises - Hip Abductors 1. Lie on your side with your left / right leg in the top position. Lie so your head, shoulder, knee, and hip line up. You may bend your  bottom knee to help you keep your balance. 2. Roll your hips slightly forward so your hips are stacked directly over each other and your left / right knee is facing forward. 3. Leading with your heel, lift your top leg 4-6 inches (10-15 cm). You should feel the muscles in your outer hip lifting. ? Do not let your foot drift forward. ? Do not let your knee roll toward the ceiling. 4. Hold this position for __________ seconds. 5. Slowly return your leg to the starting position. 6. Let your muscles relax completely after each repetition. Repeat __________ times. Complete this exercise __________ times a day. Exercise L: Straight Leg Raises - Hip Extensors 1. Lie on your abdomen on a firm surface. You can put a pillow under your hips if that is more comfortable. 2. Tense the muscles in your buttocks and lift your left / right leg about 4-6 inches (10-15 cm). Keep your knee straight as you lift your leg. 3. Hold this position for __________ seconds. 4. Slowly lower your leg to the starting position. 5. Let your leg relax completely after each repetition. Repeat __________ times. Complete this exercise __________ times a day. This information is not intended to replace advice given to you by your health care provider. Make sure you discuss any questions you have with your health care provider. Document Released: 04/23/2005 Document Revised: 03/03/2016 Document Reviewed: 04/15/2015 Elsevier Interactive Patient Education  2018 Elsevier Inc.  

## 2017-10-21 DIAGNOSIS — N182 Chronic kidney disease, stage 2 (mild): Secondary | ICD-10-CM | POA: Diagnosis not present

## 2017-10-21 DIAGNOSIS — I129 Hypertensive chronic kidney disease with stage 1 through stage 4 chronic kidney disease, or unspecified chronic kidney disease: Secondary | ICD-10-CM | POA: Diagnosis not present

## 2017-10-21 DIAGNOSIS — E1122 Type 2 diabetes mellitus with diabetic chronic kidney disease: Secondary | ICD-10-CM | POA: Diagnosis not present

## 2017-10-21 DIAGNOSIS — N08 Glomerular disorders in diseases classified elsewhere: Secondary | ICD-10-CM | POA: Diagnosis not present

## 2017-10-30 DIAGNOSIS — M170 Bilateral primary osteoarthritis of knee: Secondary | ICD-10-CM | POA: Insufficient documentation

## 2017-10-30 DIAGNOSIS — Z840 Family history of diseases of the skin and subcutaneous tissue: Secondary | ICD-10-CM | POA: Insufficient documentation

## 2017-10-30 DIAGNOSIS — M19042 Primary osteoarthritis, left hand: Secondary | ICD-10-CM

## 2017-10-30 DIAGNOSIS — M19041 Primary osteoarthritis, right hand: Secondary | ICD-10-CM | POA: Insufficient documentation

## 2017-10-30 DIAGNOSIS — F5101 Primary insomnia: Secondary | ICD-10-CM | POA: Insufficient documentation

## 2017-10-30 NOTE — Progress Notes (Signed)
Office Visit Note  Patient: [name] [name]             Date of Birth: [birth year]           MRN: [id number]             PCP: Glendale Chard, MD Referring: Glendale Chard, MD Visit Date: 11/05/2017 Occupation: _0 @    Subjective:  Pain in hands and knees.   History of Present Illness: [name] [name] is a 67 y.o. female the of osteoarthritis.  She states she continues to have some discomfort in her bilateral hands and her bilateral knees.  She has been trying to take natural anti-inflammatories and also using right CMC brace.  She states she had some paresthesias in her hands recently which was related to high blood sugar.  She has made some adjustments in her diabetes medications.  Has been also trying to do some exercises for her knee joints which were given during the last visit.  She has been exercising on the treadmill and also on the bike.  She is been also lifting weights.  Activities of Daily Living:  Patient reports morning stiffness for 0 minute.   Patient Reports nocturnal pain.  Difficulty dressing/grooming: Denies Difficulty climbing stairs: Reports Difficulty getting out of chair: Reports Difficulty using hands for taps, buttons, cutlery, and/or writing: Denies   Review of Systems  Constitutional: Positive for fatigue. Negative for night sweats, weight gain and weight loss.  HENT: Negative for mouth sores, trouble swallowing, trouble swallowing, mouth dryness and nose dryness.   Eyes: Negative for pain, redness, visual disturbance and dryness.  Respiratory: Negative for cough, shortness of breath and difficulty breathing.   Cardiovascular: Negative for chest pain, palpitations, hypertension, irregular heartbeat and swelling in legs/feet.  Gastrointestinal: Negative for blood in stool, constipation and diarrhea.  Endocrine: Negative for increased urination.  Genitourinary: Negative for vaginal dryness.  Musculoskeletal: Positive for arthralgias, joint pain and  morning stiffness. Negative for joint swelling, myalgias, muscle weakness, muscle tenderness and myalgias.  Skin: Negative for color change, rash, hair loss, skin tightness, ulcers and sensitivity to sunlight.  Allergic/Immunologic: Negative for susceptible to infections.  Neurological: Negative for dizziness, memory loss, night sweats and weakness.  Hematological: Negative for swollen glands.  Psychiatric/Behavioral: Positive for sleep disturbance. Negative for depressed mood. The patient is not nervous/anxious.     PMFS History:  Patient Active Problem List   Diagnosis Date Noted  . Primary osteoarthritis of both feet 11/05/2017  . Primary osteoarthritis of both hands 10/30/2017  . Primary osteoarthritis of both knees 10/30/2017  . Primary insomnia 10/30/2017  . Family history of lupus erythematosus 10/30/2017  . Dyspnea on exertion 12/10/2016  . Uncontrolled diabetes mellitus type 2 without complications ([place]) 22/07/5425  . Acute cystitis 09/21/2014  . Hyperglycemia 09/19/2014  . Bradycardia 09/19/2014  . Hyponatremia 09/19/2014  . Elevated alkaline phosphatase level 09/19/2014  . Acute kidney injury ([place])   . Essential hypertension, benign 07/05/2008  . Coronary atherosclerosis 07/05/2008  . TOBACCO USER 07/03/2008    Past Medical History:  Diagnosis Date  . Anemia   . Carpal tunnel syndrome   . Coronary artery disease    DES to ostial RCA 2005  . Diabetes mellitus without complication ([place])    No longer taking meds  . Dyslipidemia   . Hypertension   . Osteoarthritis   . PAD (peripheral artery disease) ([facility])     Family History  Problem Relation Age of Onset  . CAD Father 57  .  CAD Mother 61  . CAD Brother   . Cancer Brother   . CAD Sister   . CAD Brother    Past Surgical History:  Procedure Laterality Date  . APPENDECTOMY    . TONSILLECTOMY    . WRIST SURGERY     Social History   Social History Narrative   Lives alone.   One son died in a car accident.        Objective: Vital Signs: BP 130/78 (BP Location: Left Arm, Patient Position: Sitting, Cuff Size: Normal)   Pulse 62   Resp 15   Ht 5' 4" (1.626 m)   Wt 188 lb (85.3 kg)   BMI 32.27 kg/m    Physical Exam  Constitutional: She is oriented to person, place, and time. She appears well-developed and well-nourished.  HENT:  Head: Normocephalic and atraumatic.  Eyes: Conjunctivae and EOM are normal.  Neck: Normal range of motion.  Cardiovascular: Normal rate, regular rhythm, normal heart sounds and intact distal pulses.  Pulmonary/Chest: Effort normal and breath sounds normal.  Abdominal: Soft. Bowel sounds are normal.  Lymphadenopathy:    She has no cervical adenopathy.  Neurological: She is alert and oriented to person, place, and time.  Skin: Skin is warm and dry. Capillary refill takes less than 2 seconds.  Psychiatric: She has a normal mood and affect. Her behavior is normal.  Nursing note and vitals reviewed.    Musculoskeletal Exam: C-spine thoracic lumbar spine good range of motion.  Shoulder joints elbow joints wrist joints are good range of motion.  She has DIP PIP thickening in her hands and left CMC prominence.  Hip joints were in good range of motion.  She has incomplete extension of bilateral knee joints due to osteoarthritis.  She also had crepitus in her bilateral knee joints.  She had bilateral first hallux valgus deformity.  CDAI Exam: No CDAI exam completed.    Investigation: No additional findings.   Imaging: No results found.  Speciality Comments: No specialty comments available.    Procedures:  No procedures performed Allergies: Latex   Assessment / Plan:     Visit Diagnoses: Polyarthralgia - ANA positive, Ro positive,La negative (Smith, RNP, dsDNA, RF, anti-CCP negative, ESR normal, uric acid normal.  No clinical features of autoimmune disease are present at this point.  We went over the clinical features of autoimmune disease if she develops any  new symptoms she is supposed to notify me.  I will see her back in 1 year.  Primary osteoarthritis of both hands-joint protection muscle strengthening was discussed.  I have advised her to use CMC brace on regular basis.  She has been taking natural anti-inflammatories.  Primary osteoarthritis of both knees - Bilateral moderate to severe medial compartment narrowing, moderate to severe chondromalacia patella.  We had detailed discussion regarding Visco supplement injections again today.  Patient declined.  Need for regular exercise and muscle strengthening was discussed.  Primary osteoarthritis of both feet-she has bilateral hallux valgus deformity.  Proper fitting shoes were discussed.  Uncontrolled diabetes mellitus type 2 without complications ([facility])-followed up by her PCP.  Atherosclerosis of native coronary artery of native heart with angina pectoris (HCC)  TOBACCO USER  Essential hypertension, benign  Primary insomnia  Family history of lupus erythematosus - History of lupus daughter, positive family history of Crohn's disease and multiple sclerosis.    Orders: No orders of the defined types were placed in this encounter.  No orders of the defined types were placed in   this encounter.   Face-to-face time spent with patient was 30 minutes. >50% of time was spent in counseling and coordination of care.  Follow-Up Instructions: Return in about 1 year (around 11/06/2018) for Osteoarthritis, +ANA, +Ro.   Shaili Deveshwar, MD  Note - This record has been created using Dragon software.  Chart creation errors have been sought, but may not always  have been located. Such creation errors do not reflect on  the standard of medical care. 

## 2017-11-05 ENCOUNTER — Ambulatory Visit (INDEPENDENT_AMBULATORY_CARE_PROVIDER_SITE_OTHER): Payer: Medicare Other | Admitting: Rheumatology

## 2017-11-05 ENCOUNTER — Encounter: Payer: Self-pay | Admitting: Rheumatology

## 2017-11-05 VITALS — BP 130/78 | HR 62 | Resp 15 | Ht 64.0 in | Wt 188.0 lb

## 2017-11-05 DIAGNOSIS — M2550 Pain in unspecified joint: Secondary | ICD-10-CM

## 2017-11-05 DIAGNOSIS — M19071 Primary osteoarthritis, right ankle and foot: Secondary | ICD-10-CM | POA: Diagnosis not present

## 2017-11-05 DIAGNOSIS — M19072 Primary osteoarthritis, left ankle and foot: Secondary | ICD-10-CM

## 2017-11-05 DIAGNOSIS — M19041 Primary osteoarthritis, right hand: Secondary | ICD-10-CM | POA: Diagnosis not present

## 2017-11-05 DIAGNOSIS — F5101 Primary insomnia: Secondary | ICD-10-CM | POA: Diagnosis not present

## 2017-11-05 DIAGNOSIS — M170 Bilateral primary osteoarthritis of knee: Secondary | ICD-10-CM | POA: Diagnosis not present

## 2017-11-05 DIAGNOSIS — F17200 Nicotine dependence, unspecified, uncomplicated: Secondary | ICD-10-CM | POA: Diagnosis not present

## 2017-11-05 DIAGNOSIS — I25119 Atherosclerotic heart disease of native coronary artery with unspecified angina pectoris: Secondary | ICD-10-CM

## 2017-11-05 DIAGNOSIS — IMO0001 Reserved for inherently not codable concepts without codable children: Secondary | ICD-10-CM

## 2017-11-05 DIAGNOSIS — I10 Essential (primary) hypertension: Secondary | ICD-10-CM

## 2017-11-05 DIAGNOSIS — M19042 Primary osteoarthritis, left hand: Secondary | ICD-10-CM

## 2017-11-05 DIAGNOSIS — Z840 Family history of diseases of the skin and subcutaneous tissue: Secondary | ICD-10-CM | POA: Diagnosis not present

## 2017-11-05 DIAGNOSIS — E1165 Type 2 diabetes mellitus with hyperglycemia: Secondary | ICD-10-CM

## 2017-12-08 DIAGNOSIS — H25093 Other age-related incipient cataract, bilateral: Secondary | ICD-10-CM | POA: Diagnosis not present

## 2017-12-08 DIAGNOSIS — H35373 Puckering of macula, bilateral: Secondary | ICD-10-CM | POA: Diagnosis not present

## 2017-12-08 DIAGNOSIS — H25041 Posterior subcapsular polar age-related cataract, right eye: Secondary | ICD-10-CM | POA: Diagnosis not present

## 2017-12-08 DIAGNOSIS — E119 Type 2 diabetes mellitus without complications: Secondary | ICD-10-CM | POA: Diagnosis not present

## 2017-12-15 DIAGNOSIS — E1122 Type 2 diabetes mellitus with diabetic chronic kidney disease: Secondary | ICD-10-CM | POA: Diagnosis not present

## 2017-12-15 DIAGNOSIS — I129 Hypertensive chronic kidney disease with stage 1 through stage 4 chronic kidney disease, or unspecified chronic kidney disease: Secondary | ICD-10-CM | POA: Diagnosis not present

## 2017-12-15 DIAGNOSIS — N182 Chronic kidney disease, stage 2 (mild): Secondary | ICD-10-CM | POA: Diagnosis not present

## 2017-12-15 DIAGNOSIS — N08 Glomerular disorders in diseases classified elsewhere: Secondary | ICD-10-CM | POA: Diagnosis not present

## 2017-12-18 DIAGNOSIS — E1122 Type 2 diabetes mellitus with diabetic chronic kidney disease: Secondary | ICD-10-CM | POA: Diagnosis not present

## 2018-03-17 DIAGNOSIS — M542 Cervicalgia: Secondary | ICD-10-CM | POA: Diagnosis not present

## 2018-03-17 DIAGNOSIS — R51 Headache: Secondary | ICD-10-CM | POA: Diagnosis not present

## 2018-03-17 DIAGNOSIS — E1165 Type 2 diabetes mellitus with hyperglycemia: Secondary | ICD-10-CM | POA: Diagnosis not present

## 2018-03-17 DIAGNOSIS — G4700 Insomnia, unspecified: Secondary | ICD-10-CM

## 2018-03-17 DIAGNOSIS — Z79899 Other long term (current) drug therapy: Secondary | ICD-10-CM

## 2018-03-17 DIAGNOSIS — I10 Essential (primary) hypertension: Secondary | ICD-10-CM | POA: Diagnosis not present

## 2018-03-17 LAB — LIPID PANEL
Cholesterol: 166 (ref 0–200)
HDL: 48 (ref 35–70)
LDL CALC: 91
LDL/HDL RATIO: 1.9
Triglycerides: 135 (ref 40–160)

## 2018-03-17 LAB — VITAMIN D 25 HYDROXY (VIT D DEFICIENCY, FRACTURES): Vit D, 25-Hydroxy: 28.8

## 2018-03-23 ENCOUNTER — Other Ambulatory Visit: Payer: Self-pay | Admitting: Internal Medicine

## 2018-03-23 IMAGING — US US ABDOMEN COMPLETE W/ ELASTOGRAPHY
1 series · 13 of 25 positions shown · non-contrast
Comparison: None.

CLINICAL DATA: Hepatitis-C.



[Series 1: us abdomen complete w/ elastography · 0.19mm/px · 13 of 89 slices shown]
[im 1/89]
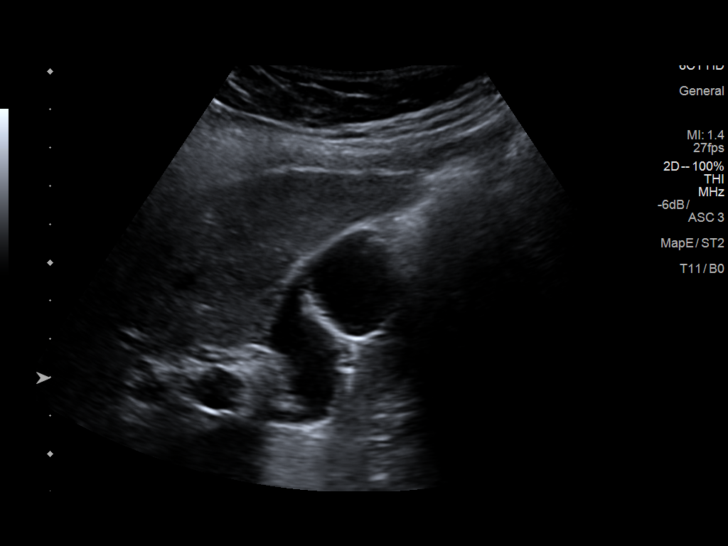
[im 8/89]
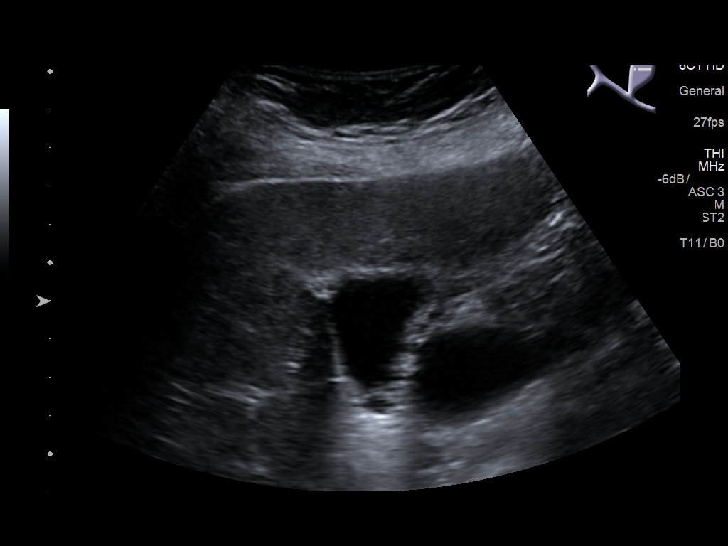
[im 15/89]
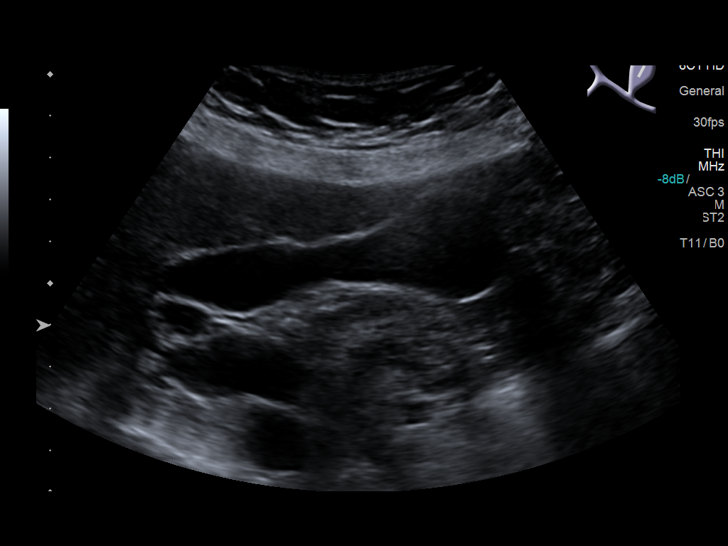
[im 23/89]
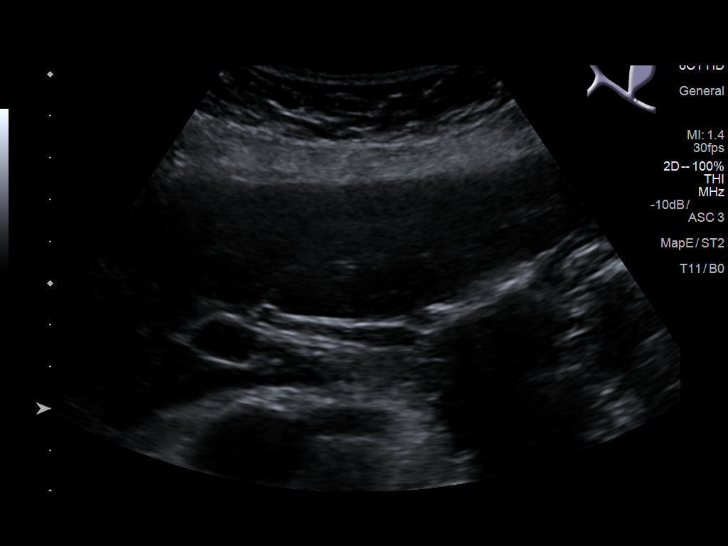
[im 30/89]
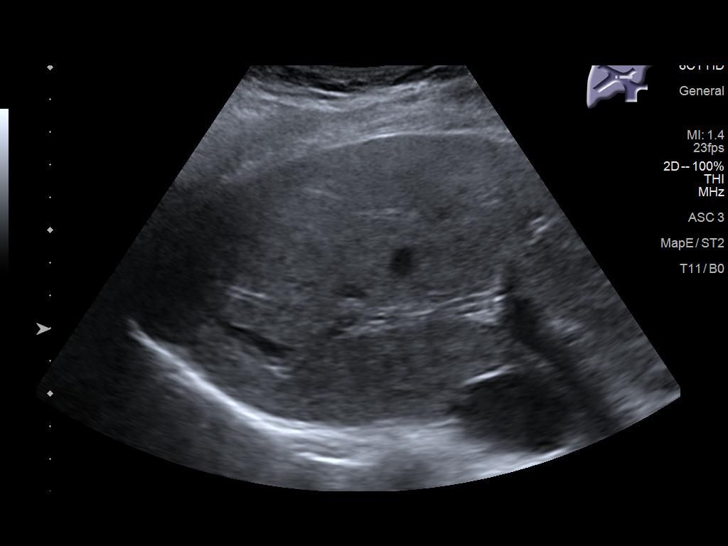
[im 37/89]
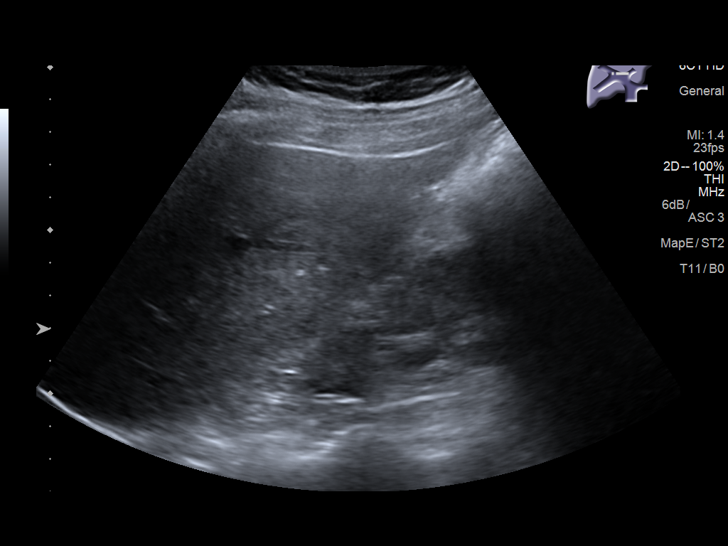
[im 45/89]
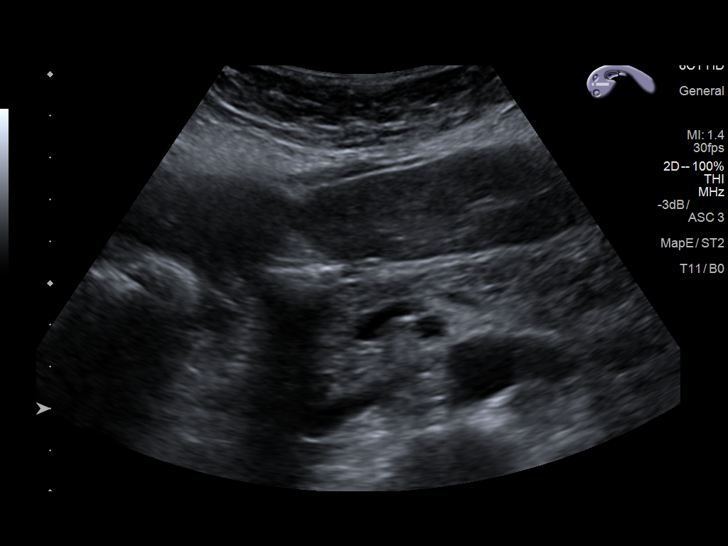
[im 52/89]
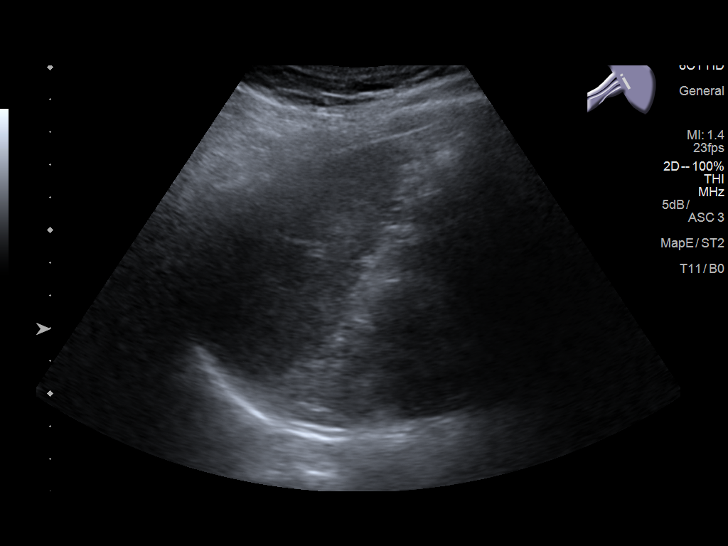
[im 59/89]
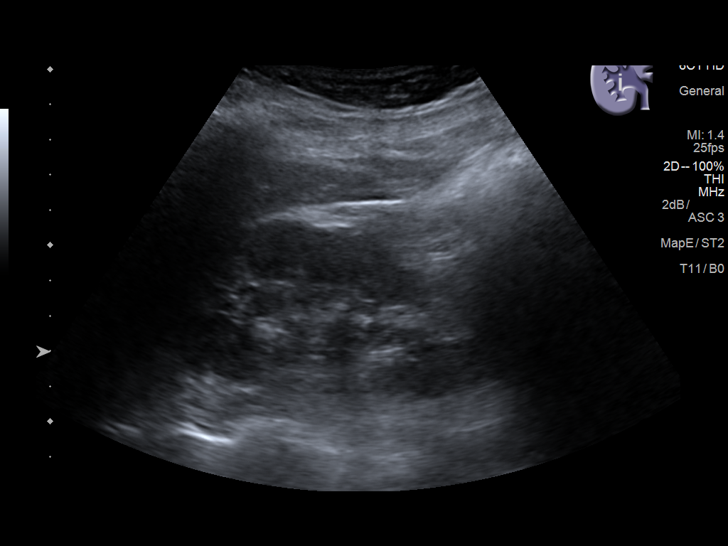
[im 67/89]
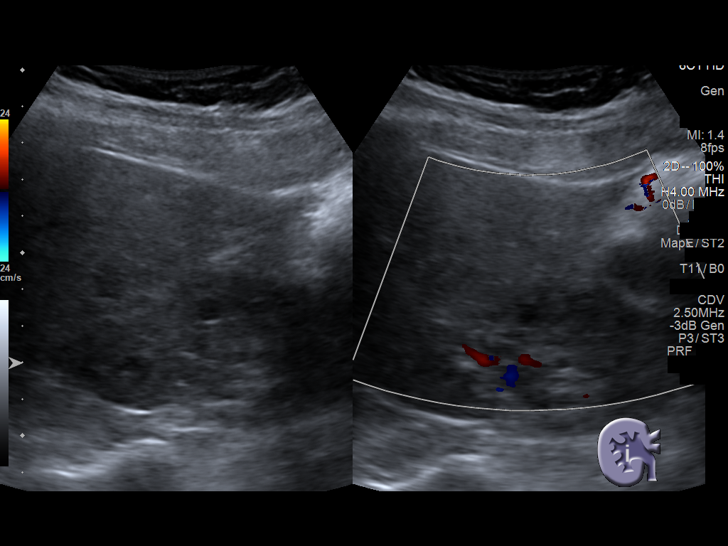
[im 74/89]
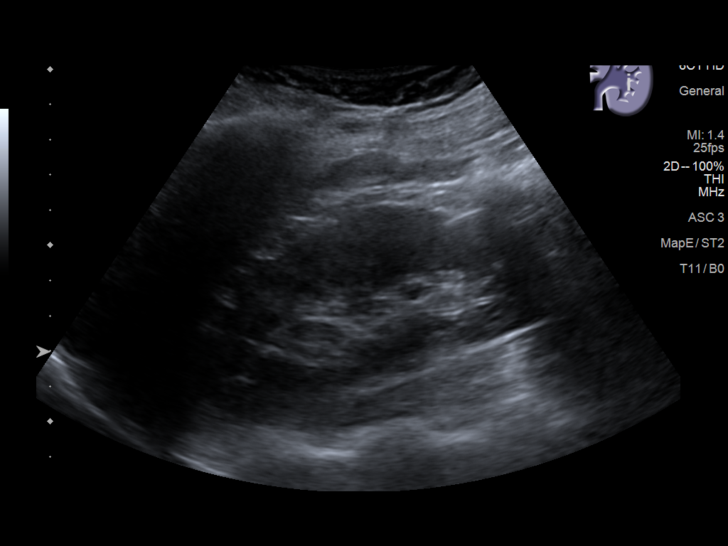
[im 81/89]
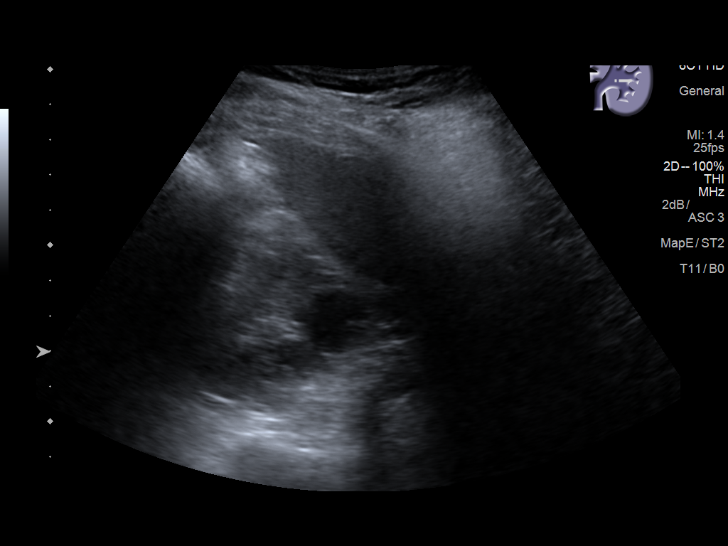
[im 89/89]
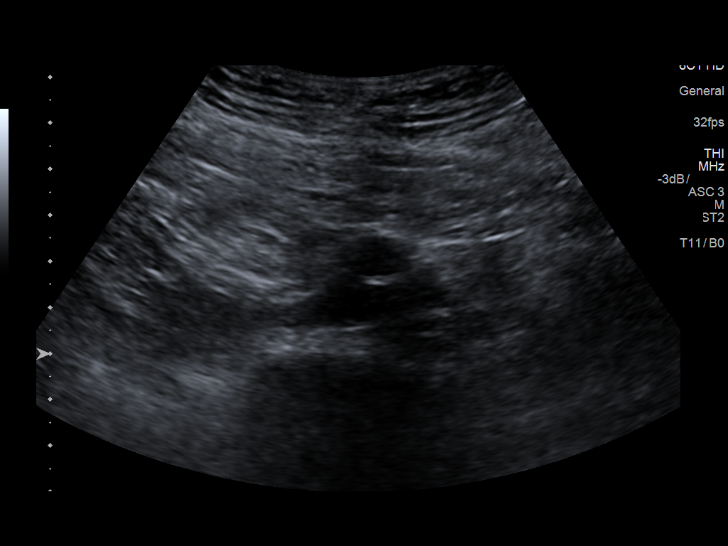

[13 of 25 positions shown; findings below may reference images not displayed]

FINDINGS: ULTRASOUND ABDOMEN

Gallbladder: No gallstones or wall thickening visualized. No
sonographic Murphy sign noted by sonographer.

Common bile duct: Diameter: 3.6 mm

Liver: Normal echogenicity without focal lesion or biliary
dilatation.

IVC: Normal caliber

Pancreas: Sonographically unremarkable.

Spleen: Normal size.  No focal lesions.

Right Kidney: Length: 10.0 cm. Normal renal cortical thickness and
echogenicity without hydronephrosis. There is a simple appearing 11
mm cyst noted.

Left Kidney: Length: 10.6 cm. Normal renal cortical thickness and
echogenicity without hydronephrosis. A simple appearing 2 cm cyst is
noted.

Abdominal aorta: Normal caliber

Other findings: No ascites.

ULTRASOUND HEPATIC ELASTOGRAPHY

Device: Siemens Helix VTQ

Patient position:  Left lateral decubitus

Transducer 6 C1 HD

Number of measurements:  10

Hepatic Segment:  8

Median velocity:   1.07  m/sec

IQR:

IQR/Median velocity ratio

Corresponding Metavir fibrosis score:  F0/1

Risk of fibrosis: Minimal

Limitations of exam: None

Pertinent findings noted on other imaging exams:  None

Please note that abnormal shear wave velocities may also be
identified in clinical settings other than with hepatic fibrosis,
such as: acute hepatitis, elevated right heart and central venous
pressures including use of beta blockers, Yaxcal disease
([place]), infiltrative processes such as
mastocytosis/amyloidosis/infiltrative tumor, extrahepatic
cholestasis, in the post-prandial state, and liver transplantation.
Correlation with patient history, laboratory data, and clinical
condition recommended.
IMPRESSION: Unremarkable abdominal ultrasound examination.

Median hepatic shear wave velocity is calculated at 1.07 m/sec.

Corresponding Metavir fibrosis score is F0/1.

Risk of fibrosis is minimal.

Follow-up:  None required

## 2018-04-08 ENCOUNTER — Encounter: Payer: Self-pay | Admitting: Cardiology

## 2018-04-08 NOTE — Progress Notes (Signed)
HPI The patient presents for follow up of CAD.  I saw her one year ago.  She had a history of a PCI in 2005.  She had a stress perfusion study in 2013 demonstrated no evidence of ischemia and well-preserved ejection fraction.  Last year she had a negative inadequate POET (Plain Old Exercise Treadmill)   Since I last saw her she has done well. The patient denies any new symptoms such as chest discomfort, neck or arm discomfort. There has been no new shortness of breath, PND or orthopnea. There have been no reported palpitations, presyncope or syncope.    Allergies  Allergen Reactions  . Latex Rash    Current Outpatient Medications  Medication Sig Dispense Refill  . amLODipine (NORVASC) 5 MG tablet Take 1 tablet by mouth daily.  5  . aspirin 81 MG tablet Take 81 mg by mouth daily.    . beta carotene 56213 UNIT capsule Take 25,000 Units by mouth daily.    Marland Kitchen BIOTIN PO Take by mouth.    . Cholecalciferol ([employer] VIT D 5000 HIGH-POTENCY PO) Take 1 tablet by mouth daily.     . CHROMIUM PICOLINATE PO Take 1,000 mg by mouth daily.    Marland Kitchen gabapentin (NEURONTIN) 300 MG capsule TK 1 C PO BID  0  . glucosamine-chondroitin 500-400 MG tablet Take 1 tablet by mouth daily. Reported on 12/17/2015    . glucose blood (ONETOUCH VERIO) test strip CHECK BLOOD SUGAR THREE TIMES DAILY dx: e11.65 150 each 11  . MEGARED OMEGA-3 KRILL OIL PO Take by mouth daily.    . metoprolol (LOPRESSOR) 50 MG tablet Take 0.5 tablets (25 mg total) by mouth 2 (two) times daily. 30 tablet 0  . rosuvastatin (CRESTOR) 10 MG tablet Take 10 mg by mouth daily.    . traZODone (DESYREL) 50 MG tablet Take 50 mg by mouth as needed.   2  . TRESIBA FLEXTOUCH 200 UNIT/ML SOPN INJECT 40 UNITS UNDER THE SKIN EVERY NIGHT AT BEDTIME. MAX DOSE IS 80 UNITS.  1  . vitamin C (ASCORBIC ACID) 500 MG tablet Take 500 mg by mouth daily.     No current facility-administered medications for this visit.     Past Medical History:  Diagnosis Date  .  Anemia   . Carpal tunnel syndrome   . Coronary artery disease    DES to ostial RCA 2005  . Diabetes mellitus without complication (HCC)    No longer taking meds  . Dyslipidemia   . Hypertension   . Osteoarthritis   . PAD (peripheral artery disease) (HCC)     Past Surgical History:  Procedure Laterality Date  . APPENDECTOMY    . TONSILLECTOMY    . WRIST SURGERY      ROS:  As stated in the HPI and negative for all other systems.  PHYSICAL EXAM BP 136/86   Pulse (!) 59   Ht 5\' 4"  (1.626 m)   Wt 193 lb (87.5 kg)   BMI 33.13 kg/m   GENERAL:  Well appearing NECK:  No jugular venous distention, waveform within normal limits, carotid upstroke brisk and symmetric, no bruits, no thyromegaly LUNGS:  Clear to auscultation bilaterally CHEST:  Unremarkable HEART:  PMI not displaced or sustained,S1 and S2 within normal limits, no S3, no S4, no clicks, no rubs, no murmurs ABD:  Flat, positive bowel sounds normal in frequency in pitch, no bruits, no rebound, no guarding, no midline pulsatile mass, no hepatomegaly, no splenomegaly EXT:  2 plus pulses throughout, no edema, no cyanosis no clubbing   EKG:  Sinus rhythm, rate 59, axis within normal limits, intervals within normal limits, poor anterior R wave progression, no acute ST-T wave changes area  04/09/2018   ASSESSMENT AND PLAN  CAD  She had a negative inadequate POET (Plain Old Exercise Treadmill) last year. No change in therapy.  She will continue with risk reduction.   Dyslipidemia/DM -   This is followed by [name], MD.  Was 15 recently.  She is been off her diet because she had to move temporarily until although tell was a fix her apartment.  She can get back on her diet and I rather she did this and increase her Crestor but if in 6 months her LDL is not less than 70 she should have that increased.   Hypertension - The blood pressure is at target.  No change in therapy.   DM - She says her A1c is better than the 8.9  but I see.  We talked about diet and activity.  I will defer to [name], MD  Tobacco smoke - She says she is done with cigarettes forever.   She is  still not smoking (four years)!!!

## 2018-04-09 ENCOUNTER — Ambulatory Visit (INDEPENDENT_AMBULATORY_CARE_PROVIDER_SITE_OTHER): Payer: Medicare Other | Admitting: Cardiology

## 2018-04-09 ENCOUNTER — Encounter: Payer: Self-pay | Admitting: Cardiology

## 2018-04-09 VITALS — BP 136/86 | HR 59 | Ht 64.0 in | Wt 193.0 lb

## 2018-04-09 DIAGNOSIS — I10 Essential (primary) hypertension: Secondary | ICD-10-CM

## 2018-04-09 DIAGNOSIS — E785 Hyperlipidemia, unspecified: Secondary | ICD-10-CM

## 2018-04-09 DIAGNOSIS — I25119 Atherosclerotic heart disease of native coronary artery with unspecified angina pectoris: Secondary | ICD-10-CM | POA: Diagnosis not present

## 2018-04-09 NOTE — Patient Instructions (Signed)
Medication Instructions:  Continue current medications  If you need a refill on your cardiac medications before your next appointment, please call your pharmacy.  Labwork: None Ordered   If you have labs (blood work) drawn today and your tests are completely normal, you will receive your results only by: . MyChart Message (if you have MyChart) OR . A paper copy in the mail If you have any lab test that is abnormal or we need to change your treatment, we will call you to review the results.  Testing/Procedures: None Ordered  Follow-Up: You will need a follow up appointment in 1 Year.  Please call our office 2 months in advance([phone]) to schedule the appointment.  You may see  DR Hochrein or one of the following Advanced Practice Providers on your designated Care Team:   . Kathryn Lawrence, DNP, ANP . Rhonda Barrett, PA-C .  . Luke Kilroy, PA-C . Krista Kroeger, PA-C . Callie Goodrich, PA-C .  . Hao Meng, PA-C . Angela Duke, PA-C  At [facility], you and your health needs are our priority.  As part of our continuing mission to provide you with exceptional heart care, we have created designated Provider Care Teams.  These Care Teams include your primary Cardiologist (physician) and Advanced Practice Providers (APPs -  Physician Assistants and Nurse Practitioners) who all work together to provide you with the care you need, when you need it.   Thank you for choosing [facility]!!       

## 2018-04-19 ENCOUNTER — Other Ambulatory Visit: Payer: Self-pay | Admitting: Internal Medicine

## 2018-04-30 DIAGNOSIS — H43812 Vitreous degeneration, left eye: Secondary | ICD-10-CM | POA: Diagnosis not present

## 2018-05-02 ENCOUNTER — Other Ambulatory Visit: Payer: Self-pay | Admitting: Internal Medicine

## 2018-05-13 ENCOUNTER — Encounter: Payer: Self-pay | Admitting: Internal Medicine

## 2018-05-13 ENCOUNTER — Ambulatory Visit (INDEPENDENT_AMBULATORY_CARE_PROVIDER_SITE_OTHER): Payer: Medicare Other | Admitting: Internal Medicine

## 2018-05-13 VITALS — BP 132/84 | HR 69 | Temp 98.1°F | Ht 64.0 in | Wt 193.2 lb

## 2018-05-13 DIAGNOSIS — E6609 Other obesity due to excess calories: Secondary | ICD-10-CM | POA: Diagnosis not present

## 2018-05-13 DIAGNOSIS — I10 Essential (primary) hypertension: Secondary | ICD-10-CM

## 2018-05-13 DIAGNOSIS — J0100 Acute maxillary sinusitis, unspecified: Secondary | ICD-10-CM

## 2018-05-13 DIAGNOSIS — Z6833 Body mass index (BMI) 33.0-33.9, adult: Secondary | ICD-10-CM

## 2018-05-13 DIAGNOSIS — E1165 Type 2 diabetes mellitus with hyperglycemia: Secondary | ICD-10-CM

## 2018-05-13 MED ORDER — AMOXICILLIN-POT CLAVULANATE 875-125 MG PO TABS: 1.0000 | ORAL_TABLET | Freq: Two times a day (BID) | ORAL | 0 refills | Status: AC

## 2018-05-13 MED ORDER — CEFTRIAXONE SODIUM 500 MG IJ SOLR
500.0000 mg | Freq: Once | INTRAMUSCULAR | Status: AC
  Administered 2018-05-13: 500 mg via INTRAMUSCULAR

## 2018-05-13 MED ORDER — HYDROCODONE-HOMATROPINE 5-1.5 MG/5ML PO SYRP: 5.0000 mL | ORAL_SOLUTION | Freq: Four times a day (QID) | ORAL | 0 refills | Status: DC | PRN

## 2018-05-13 NOTE — Patient Instructions (Signed)

## 2018-05-14 LAB — BMP8+EGFR
BUN / CREAT RATIO: 13 (ref 12–28)
BUN: 11 mg/dL (ref 8–27)
CALCIUM: 9.7 mg/dL (ref 8.7–10.3)
CHLORIDE: 100 mmol/L (ref 96–106)
CO2: 27 mmol/L (ref 20–29)
Creatinine, Ser: 0.84 mg/dL (ref 0.57–1.00)
GFR calc Af Amer: 83 mL/min/{1.73_m2} (ref >59)
GFR calc non Af Amer: 72 mL/min/{1.73_m2} (ref >59)
GLUCOSE: 92 mg/dL (ref 65–99)
POTASSIUM: 4.1 mmol/L (ref 3.5–5.2)
Sodium: 141 mmol/L (ref 134–144)

## 2018-05-14 LAB — HEMOGLOBIN A1C
Est. average glucose Bld gHb Est-mCnc: 174 mg/dL
HEMOGLOBIN A1C: 7.7 % — AB (ref 4.8–5.6)

## 2018-05-14 NOTE — Progress Notes (Signed)
Your kidney fxn is stable. hba1c is 7.7, we are back within an acceptable range. Be sure to exercise no less than four days weekly for at least 30 minutes. Kw-please abstract previous a1c and last lipid. Thanks.

## 2018-05-15 ENCOUNTER — Encounter: Payer: Self-pay | Admitting: Internal Medicine

## 2018-05-15 NOTE — Progress Notes (Signed)
Subjective:     Patient ID: [name] [name] , female    DOB: May 21, 1951 , 67 y.o.   MRN: [id number]   Chief Complaint  Patient presents with  . Diabetes  . Hypertension  . Cough    HPI  Diabetes  She presents for her follow-up diabetic visit. She has type 2 diabetes mellitus. Her disease course has been improving. There are no hypoglycemic associated symptoms. Pertinent negatives for hypoglycemia include no headaches. Associated symptoms include fatigue. Pertinent negatives for diabetes include no blurred vision and no chest pain. There are no hypoglycemic complications. Risk factors for coronary artery disease include diabetes mellitus, dyslipidemia, hypertension and post-menopausal. Current diabetic treatment includes insulin injections. She is compliant with treatment most of the time. She is following a diabetic diet. Eye exam is current.  Hypertension  This is a chronic problem. The current episode started more than 1 year ago. The problem has been gradually improving since onset. The problem is controlled. Pertinent negatives include no blurred vision, chest pain, headaches, palpitations or shortness of breath.  Cough  This is a new problem. The current episode started 1 to 4 weeks ago. The problem has been unchanged. The cough is non-productive. Associated symptoms include nasal congestion and postnasal drip. Pertinent negatives include no chest pain, headaches or shortness of breath. The symptoms are aggravated by lying down. She has tried OTC cough suppressant for the symptoms. The treatment provided no relief.     Past Medical History:  Diagnosis Date  . Anemia   . Carpal tunnel syndrome   . Coronary artery disease    DES to ostial RCA 2005  . Diabetes mellitus without complication ([place])    No longer taking meds  . Dyslipidemia   . Hypertension   . Osteoarthritis   . PAD (peripheral artery disease) (HCC)      Family History  Problem Relation Age of Onset  . CAD Father  4  . CAD Mother 25  . CAD Brother   . Cancer Brother   . CAD Sister   . CAD Brother      Current Outpatient Medications:  .  amLODipine (NORVASC) 5 MG tablet, TAKE 1 TABLET BY MOUTH EVERY DAY, Disp: 90 tablet, Rfl: 0 .  aspirin 81 MG tablet, Take 81 mg by mouth daily., Disp: , Rfl:  .  BD PEN NEEDLE NANO U/F 32G X 4 MM MISC, USE TO INJECT INSULIN DAILY, Disp: , Rfl: 4 .  beta carotene 25000 UNIT capsule, Take 25,000 Units by mouth daily., Disp: , Rfl:  .  BIOTIN PO, Take by mouth., Disp: , Rfl:  .  Cholecalciferol ([employer]S VIT D 5000 HIGH-POTENCY PO), Take 1 tablet by mouth daily. , Disp: , Rfl:  .  CHROMIUM PICOLINATE PO, Take 1,000 mg by mouth daily., Disp: , Rfl:  .  Coenzyme Q10 (COQ-10) 100 MG CAPS, Take by mouth., Disp: , Rfl:  .  Cyanocobalamin (VITAMIN B-12 PO), Take by mouth., Disp: , Rfl:  .  gabapentin (NEURONTIN) 300 MG capsule, TK 1 C PO BID, Disp: , Rfl: 0 .  glucosamine-chondroitin 500-400 MG tablet, Take 1 tablet by mouth daily. Reported on 12/17/2015, Disp: , Rfl:  .  glucose blood (ONETOUCH VERIO) test strip, CHECK BLOOD SUGAR THREE TIMES DAILY dx: e11.65, Disp: 150 each, Rfl: 11 .  MEGARED OMEGA-3 KRILL OIL PO, Take by mouth daily., Disp: , Rfl:  .  metoprolol tartrate (LOPRESSOR) 50 MG tablet, Take 50 mg by mouth 2 (two) times daily.,  Disp: , Rfl:  .  ONETOUCH DELICA LANCETS 54O MISC, USE AS DIRECTED TO CHECK BLOOD SUGAR THREE TIMES DAILY, Disp: 100 each, Rfl: 0 .  rosuvastatin (CRESTOR) 10 MG tablet, Take 10 mg by mouth daily., Disp: , Rfl:  .  traZODone (DESYREL) 50 MG tablet, Take 50 mg by mouth as needed. , Disp: , Rfl: 2 .  TRESIBA FLEXTOUCH 200 UNIT/ML SOPN, 44 Units. , Disp: , Rfl: 1 .  vitamin C (ASCORBIC ACID) 500 MG tablet, Take 500 mg by mouth daily., Disp: , Rfl:  .  amoxicillin-clavulanate (AUGMENTIN) 875-125 MG tablet, Take 1 tablet by mouth 2 (two) times daily for 10 days., Disp: 20 tablet, Rfl: 0 .  HYDROcodone-homatropine (HYDROMET) 5-1.5 MG/5ML syrup,  Take 5 mLs by mouth every 6 (six) hours as needed for cough., Disp: 120 mL, Rfl: 0   Allergies  Allergen Reactions  . Latex Rash     Review of Systems  Constitutional: Positive for fatigue.  HENT: Positive for postnasal drip, sinus pressure and sinus pain.   Eyes: Negative for blurred vision.  Respiratory: Positive for cough. Negative for shortness of breath.   Cardiovascular: Negative.  Negative for chest pain and palpitations.  Gastrointestinal: Negative.   Neurological: Negative.  Negative for headaches.     Today's Vitals   05/13/18 1510  BP: 132/84  Pulse: 69  Temp: 98.1 F (36.7 C)  TempSrc: Oral  SpO2: 95%  Weight: 193 lb 3.2 oz (87.6 kg)  Height: 5' 4"  (1.626 m)  PainSc: 0-No pain   Body mass index is 33.16 kg/m.   Objective:  Physical Exam  Constitutional: She is oriented to person, place, and time. She appears well-developed and well-nourished.  Ill appearing  HENT:  Head: Normocephalic and atraumatic.  Right Ear: External ear normal.  Left Ear: External ear normal.  Mouth/Throat: Posterior oropharyngeal erythema present. No oropharyngeal exudate.  Maxillary sinus tenderness  Eyes: EOM are normal.  Cardiovascular: Normal rate, regular rhythm and normal heart sounds.  Pulmonary/Chest: Effort normal and breath sounds normal.  Neurological: She is alert and oriented to person, place, and time.  Psychiatric: She has a normal mood and affect.        Assessment And Plan:     1. Uncontrolled type 2 diabetes mellitus with hyperglycemia ([place])  I will check labs as listed below. She is encouraged to incorporate more exercise into her daily routine.  - BMP8+EGFR - Hemoglobin A1c  2. Essential hypertension, benign  Controlled. She will continue with current meds. She is encouraged to avoid adding salt to her foods.   3. Acute non-recurrent maxillary sinusitis  She was given rx augmentin to take twice daily. She is encouraged to take full course of abx.  She is also encouraged to avoid dairy products while on abx.   - cefTRIAXone (ROCEPHIN) injection 500 mg  4. Class 1 obesity due to excess calories with serious comorbidity and body mass index (BMI) of 33.0 to 33.9 in adult  She is encouraged to strive for BMI less than 30 to decrease cardiac risk. She is encouraged to exercise no less than five days weekly for 30 minutes.   [name], MD

## 2018-05-18 ENCOUNTER — Encounter: Payer: Self-pay | Admitting: Internal Medicine

## 2018-05-18 ENCOUNTER — Telehealth: Payer: Self-pay

## 2018-05-18 ENCOUNTER — Ambulatory Visit: Payer: Medicare Other | Admitting: Internal Medicine

## 2018-05-18 NOTE — Telephone Encounter (Signed)
Returned the pt's call and left a message that I was calling to see what medication that the pt was taking that she said that

## 2018-05-19 ENCOUNTER — Other Ambulatory Visit: Payer: Self-pay | Admitting: Internal Medicine

## 2018-05-19 ENCOUNTER — Encounter: Payer: Self-pay | Admitting: Internal Medicine

## 2018-05-28 ENCOUNTER — Ambulatory Visit (INDEPENDENT_AMBULATORY_CARE_PROVIDER_SITE_OTHER): Payer: Medicare Other | Admitting: Internal Medicine

## 2018-05-28 ENCOUNTER — Encounter: Payer: Self-pay | Admitting: Internal Medicine

## 2018-05-28 VITALS — BP 150/92 | HR 71 | Temp 97.9°F | Ht 64.0 in | Wt 194.2 lb

## 2018-05-28 DIAGNOSIS — M79609 Pain in unspecified limb: Secondary | ICD-10-CM

## 2018-05-28 DIAGNOSIS — R202 Paresthesia of skin: Secondary | ICD-10-CM | POA: Diagnosis not present

## 2018-05-28 DIAGNOSIS — R040 Epistaxis: Secondary | ICD-10-CM | POA: Diagnosis not present

## 2018-05-28 DIAGNOSIS — M791 Myalgia, unspecified site: Secondary | ICD-10-CM | POA: Diagnosis not present

## 2018-05-28 DIAGNOSIS — I10 Essential (primary) hypertension: Secondary | ICD-10-CM

## 2018-05-28 DIAGNOSIS — Z7982 Long term (current) use of aspirin: Secondary | ICD-10-CM

## 2018-05-28 NOTE — Patient Instructions (Signed)
Nosebleed, Adult A nosebleed is when blood comes out of the nose. Nosebleeds are common. Usually, they are not a sign of a serious condition. Nosebleeds can happen if a small blood vessel in your nose starts to bleed or if the lining of your nose (mucous membrane) cracks. They are commonly caused by:  Allergies.  Colds.  Picking your nose.  Blowing your nose too hard.  An injury from sticking an object into your nose or getting hit in the nose.  Dry or cold air.  Less common causes of nosebleeds include:  Toxic fumes.  Something abnormal in the nose or in the air-filled spaces in the bones of the face (sinuses).  Growths in the nose, such as polyps.  Medicines or conditions that cause blood to clot slowly.  Certain illnesses or procedures that irritate or dry out the nasal passages.  Follow these instructions at home: When you have a nosebleed:  Sit down and tilt your head slightly forward.  Use a clean towel or tissue to pinch your nostrils under the bony part of your nose. After 10 minutes, let go of your nose and see if bleeding starts again. Do not release pressure before that time. If there is still bleeding, repeat the pinching and holding for 10 minutes until the bleeding stops.  Do not place tissues or gauze in the nose to stop bleeding.  Avoid lying down and avoid tilting your head backward. That may make blood collect in the throat and cause gagging or coughing.  Use a nasal spray decongestant to help with a nosebleed as told by your health care provider.  Do not use petroleum jelly or mineral oil in your nose. It can drip into your lungs. After a nosebleed:  Avoid blowing your nose or sniffing for a number of hours.  Avoid straining, lifting, or bending at the waist for several days. You may resume other normal activities as you are able.  Use saline spray or a humidifier as told by your health care provider.  Aspirinand blood thinners make bleeding more  likely. If you are prescribed these medicines and you suffer from nosebleeds: ? Ask your health care provider if you should stop taking the medicines or if you should adjust the dose. ? Do not stop taking medicines that your health care provider has recommended unless told by your health care provider.  If your nosebleed was caused by dry mucous membranes, use over-the-counter saline nasal spray or gel. This will keep the mucous membranes moist and allow them to heal. If you must use a lubricant: ? Choose one that is water-soluble. ? Use only as much as you need and use it only as often as needed. ? Do not lie down until several hours after you use it. Contact a health care provider if:  You have a fever.  You get nosebleeds often or more often than usual.  You bruise very easily.  You have a nosebleed from having something stuck in your nose.  You have bleeding in your mouth.  You vomit or cough up brown material.  You have a nosebleed after you start a new medicine. Get help right away if:  You have a nosebleed after a fall or a head injury.  Your nosebleed does not go away after 20 minutes.  You feel dizzy or weak.  You have unusual bleeding from other parts of your body.  You have unusual bruising on other parts of your body.  You become sweaty.    You vomit blood. This information is not intended to replace advice given to you by your health care provider. Make sure you discuss any questions you have with your health care provider. Document Released: 03/19/2005 Document Revised: 02/07/2016 Document Reviewed: 12/25/2015 Elsevier Interactive Patient Education  2018 [employer] Inc[employer]  

## 2018-05-29 LAB — CK: Total CK: 137 U/L (ref 24–173)

## 2018-05-30 ENCOUNTER — Encounter: Payer: Self-pay | Admitting: Internal Medicine

## 2018-05-30 NOTE — Progress Notes (Signed)
Subjective:     Patient ID: [name] [name] , female    DOB: 09/23/1950 , 67 y.o.   MRN: [id number]   Chief Complaint  Patient presents with  . nose bleed    HPI  Epistaxis   The bleeding has been from the left nare. This is a recurrent problem. The current episode started 1 to 4 weeks ago. The problem occurs every several days. The problem has been unchanged. The bleeding is associated with aspirin and dry air. She has tried nothing for the symptoms. The treatment provided no relief. Her past medical history is significant for frequent nosebleeds.     Past Medical History:  Diagnosis Date  . Anemia   . Carpal tunnel syndrome   . Coronary artery disease    DES to ostial RCA 2005  . Diabetes mellitus without complication (HCC)    No longer taking meds  . Dyslipidemia   . Hypertension   . Osteoarthritis   . PAD (peripheral artery disease) (HCC)      Family History  Problem Relation Age of Onset  . CAD Father 52  . CAD Mother 60  . CAD Brother   . Cancer Brother   . CAD Sister   . CAD Brother      Current Outpatient Medications:  .  amLODipine (NORVASC) 5 MG tablet, TAKE 1 TABLET BY MOUTH EVERY DAY, Disp: 90 tablet, Rfl: 0 .  aspirin 81 MG tablet, Take 81 mg by mouth daily., Disp: , Rfl:  .  BD PEN NEEDLE NANO U/F 32G X 4 MM MISC, USE TO INJECT INSULIN DAILY, Disp: , Rfl: 4 .  beta carotene 40981 UNIT capsule, Take 25,000 Units by mouth daily., Disp: , Rfl:  .  BIOTIN PO, Take by mouth., Disp: , Rfl:  .  Cholecalciferol ([employer]S VIT D 5000 HIGH-POTENCY PO), Take 1 tablet by mouth daily. , Disp: , Rfl:  .  CHROMIUM PICOLINATE PO, Take 1,000 mg by mouth daily., Disp: , Rfl:  .  Coenzyme Q10 (COQ-10) 100 MG CAPS, Take by mouth., Disp: , Rfl:  .  Cyanocobalamin (VITAMIN B-12 PO), Take by mouth., Disp: , Rfl:  .  gabapentin (NEURONTIN) 300 MG capsule, TK 1 C PO BID, Disp: , Rfl: 0 .  glucosamine-chondroitin 500-400 MG tablet, Take 1 tablet by mouth daily. Reported on 12/17/2015,  Disp: , Rfl:  .  glucose blood (ONETOUCH VERIO) test strip, CHECK BLOOD SUGAR THREE TIMES DAILY dx: e11.65, Disp: 150 each, Rfl: 11 .  MEGARED OMEGA-3 KRILL OIL PO, Take by mouth daily., Disp: , Rfl:  .  metoprolol tartrate (LOPRESSOR) 50 MG tablet, Take 50 mg by mouth 2 (two) times daily., Disp: , Rfl:  .  ONETOUCH DELICA LANCETS 33G MISC, USE AS DIRECTED TO CHECK BLOOD SUGAR THREE TIMES DAILY, Disp: 100 each, Rfl: 0 .  rosuvastatin (CRESTOR) 10 MG tablet, TAKE 1 TABLET BY MOUTH DAILY, Disp: 90 tablet, Rfl: 0 .  traZODone (DESYREL) 50 MG tablet, Take 50 mg by mouth as needed. , Disp: , Rfl: 2 .  TRESIBA FLEXTOUCH 200 UNIT/ML SOPN, 44 Units. , Disp: , Rfl: 1 .  vitamin C (ASCORBIC ACID) 500 MG tablet, Take 500 mg by mouth daily., Disp: , Rfl:  .  HYDROcodone-homatropine (HYDROMET) 5-1.5 MG/5ML syrup, Take 5 mLs by mouth every 6 (six) hours as needed for cough. (Patient not taking: Reported on 05/28/2018), Disp: 120 mL, Rfl: 0   Allergies  Allergen Reactions  . Latex Rash  Review of Systems  Constitutional: Negative.   HENT: Positive for nosebleeds.   Respiratory: Negative.   Cardiovascular: Negative.   Gastrointestinal: Negative.   Genitourinary: Negative.   Musculoskeletal: Negative.   Psychiatric/Behavioral: Negative.      Today's Vitals   05/28/18 1534  BP: (!) 150/92  Pulse: 71  Temp: 97.9 F (36.6 C)  TempSrc: Oral  Weight: 194 lb 3.2 oz (88.1 kg)  Height: 5\' 4"  (1.626 m)  PainSc: 5   PainLoc: Rib Cage   Body mass index is 33.33 kg/m.   Objective:  Physical Exam  Constitutional: She is oriented to person, place, and time. She appears well-developed and well-nourished.  HENT:  Right Ear: Hearing, tympanic membrane, external ear and ear canal normal.  Left Ear: Hearing, tympanic membrane, external ear and ear canal normal.  Nose: No nasal deformity, septal deviation or nasal septal hematoma. No epistaxis.  Eyes: EOM are normal.  Cardiovascular: Normal rate,  regular rhythm and normal heart sounds.  Pulmonary/Chest: Effort normal and breath sounds normal.  Neurological: She is alert and oriented to person, place, and time.  Psychiatric: She has a normal mood and affect.  Nursing note and vitals reviewed.       Assessment And Plan:     1. Epistaxis  Recurrent. I suspect her sx are related to dry air in her home. She is encouraged to line nostrils w/ Vaseline nightly. She will let me know if her sx persist. If so, I will refer her to ENT for further evaluation. It is also possible her sx are related to her elevated blood pressure. .    2. Essential hypertension, benign  She admits she took her meds along with a [employer] meal on her way to the office. She is encouraged to take meds daily, around the same time. I will not add any meds to her current regimen at this time. If elevated at next visit, I plan to increase her amlodipine. She is encouraged to avoid fast foods, avoid adding salt to her foods and to incorporate more exercise into her daily routine.   3. Myalgia  She is advised to stop cholesterol meds for now. She is also encouraged to increase her water intake as well. I do plan to change statin dosing to M-F and skip the weekends, starting next week.  - CK, total  4. Paresthesia and pain of left extremity  I will stop gabapentin. She was given samples of Lyrica,50mg  to take twice daily. She will RTO in four weeks for re-evaluation. She is encouraged to let me know if she experiences any side effects.   Gwynneth Alimentobyn N Margit Batte, MD

## 2018-05-30 NOTE — Progress Notes (Signed)
Your muscle enzymes are wnl. pls take chol meds m-f and skip the weekends. I do not think your muscle aches are due to your chol meds.

## 2018-06-04 ENCOUNTER — Other Ambulatory Visit: Payer: Self-pay

## 2018-06-05 ENCOUNTER — Other Ambulatory Visit: Payer: Self-pay | Admitting: Internal Medicine

## 2018-06-05 MED ORDER — PREGABALIN 50 MG PO CAPS: 50.0000 mg | ORAL_CAPSULE | Freq: Two times a day (BID) | ORAL | 1 refills | Status: DC

## 2018-06-24 ENCOUNTER — Ambulatory Visit: Payer: Medicare Other | Admitting: Internal Medicine

## 2018-06-29 ENCOUNTER — Other Ambulatory Visit: Payer: Self-pay | Admitting: Internal Medicine

## 2018-06-29 ENCOUNTER — Ambulatory Visit (INDEPENDENT_AMBULATORY_CARE_PROVIDER_SITE_OTHER): Payer: Medicare Other | Admitting: Internal Medicine

## 2018-06-29 ENCOUNTER — Encounter: Payer: Self-pay | Admitting: Internal Medicine

## 2018-06-29 VITALS — BP 156/90 | HR 52 | Temp 97.5°F | Ht 64.0 in | Wt 193.0 lb

## 2018-06-29 DIAGNOSIS — I25119 Atherosclerotic heart disease of native coronary artery with unspecified angina pectoris: Secondary | ICD-10-CM

## 2018-06-29 DIAGNOSIS — Z09 Encounter for follow-up examination after completed treatment for conditions other than malignant neoplasm: Secondary | ICD-10-CM

## 2018-06-29 DIAGNOSIS — I1310 Hypertensive heart and chronic kidney disease without heart failure, with stage 1 through stage 4 chronic kidney disease, or unspecified chronic kidney disease: Secondary | ICD-10-CM | POA: Diagnosis not present

## 2018-06-29 DIAGNOSIS — N182 Chronic kidney disease, stage 2 (mild): Secondary | ICD-10-CM | POA: Diagnosis not present

## 2018-06-29 DIAGNOSIS — Z6833 Body mass index (BMI) 33.0-33.9, adult: Secondary | ICD-10-CM

## 2018-06-29 DIAGNOSIS — R202 Paresthesia of skin: Secondary | ICD-10-CM | POA: Diagnosis not present

## 2018-06-29 DIAGNOSIS — E66811 Obesity, class 1: Secondary | ICD-10-CM

## 2018-06-29 DIAGNOSIS — E6609 Other obesity due to excess calories: Secondary | ICD-10-CM

## 2018-06-29 DIAGNOSIS — Z7982 Long term (current) use of aspirin: Secondary | ICD-10-CM

## 2018-06-29 MED ORDER — AMLODIPINE BESYLATE 10 MG PO TABS: 10.0000 mg | ORAL_TABLET | Freq: Every day | ORAL | 1 refills | Status: DC

## 2018-06-29 NOTE — Patient Instructions (Signed)
Diabetic Neuropathy  Diabetic neuropathy refers to nerve damage that is caused by diabetes (diabetes mellitus). Over time, people with diabetes can develop nerve damage throughout the body. There are several types of diabetic neuropathy:  · Peripheral neuropathy. This is the most common type of diabetic neuropathy. It causes damage to nerves that carry signals between the spinal cord and other parts of the body (peripheral nerves). This usually affects nerves in the feet and legs first, and may eventually affect the hands and arms. The damage affects the ability to sense touch or temperature.  · Autonomic neuropathy. This type causes damage to nerves that control involuntary functions (autonomic nerves). These nerves carry signals that control:  ? Heartbeat.  ? Body temperature.  ? Blood pressure.  ? Urination.  ? Digestion.  ? Sweating.  ? Sexual function.  ? Response to changing blood sugar (glucose) levels.  · Focal neuropathy. This type of nerve damage affects one area of the body, such as an arm, a leg, or the face. The injury may involve one nerve or a small group of nerves. Focal neuropathy can be painful and unpredictable, and occurs most often in older adults with diabetes. This often develops suddenly, but usually improves over time and does not cause long-term problems.  · Proximal neuropathy. This type of nerve damage affects the nerves of the thighs, hips, buttocks, or legs. It causes severe pain, weakness, and muscle death (atrophy), usually in the thigh muscles. It is more common among older men and people who have type 2 diabetes. The length of recovery time may vary.  What are the causes?  Peripheral, autonomic, and focal neuropathies are caused by diabetes that is not well controlled with treatment. The cause of proximal neuropathy is not known, but it may be caused by inflammation related to uncontrolled blood glucose levels.  What are the signs or symptoms?  Peripheral neuropathy  Peripheral  neuropathy develops slowly over time. When the nerves of the feet and legs no longer work, you may experience:  · Burning, stabbing, or aching pain in the legs or feet.  · Pain or cramping in the legs or feet.  · Loss of feeling (numbness) and inability to feel pressure or pain in the feet. This can lead to:  ? Thick calluses or sores on areas of constant pressure.  ? Ulcers.  ? Reduced ability to feel temperature changes.  · Foot deformities.  · Muscle weakness.  · Loss of balance or coordination.  Autonomic neuropathy  The symptoms of autonomic neuropathy vary depending on which nerves are affected. Symptoms may include:  · Problems with digestion, such as:  ? Nausea or vomiting.  ? Poor appetite.  ? Bloating.  ? Diarrhea or constipation.  ? Trouble swallowing.  ? Losing weight without trying to.  · Problems with the heart, blood and lungs, such as:  ? Dizziness, especially when standing up.  ? Fainting.  ? Shortness of breath.  ? Irregular heartbeat.  · Bladder problems, such as:  ? Trouble starting or stopping urination.  ? Leaking urine.  ? Trouble emptying the bladder.  ? Urinary tract infections (UTIs).  · Problems with other body functions, such as:  ? Sweat. You may sweat too much or too little.  ? Temperature. You might get hot easily. Or, you might feel cold more than usual.  ? Sexual function. Men may not be able to get or maintain an erection. Women may have vaginal dryness and difficulty with arousal.    Focal neuropathy  Symptoms affect only one area of the body. Common symptoms include:  · Numbness.  · Tingling.  · Burning pain.  · Prickling feeling.  · Very sensitive skin.  · Weakness.  · Inability to move (paralysis).  · Muscle twitching.  · Muscles getting smaller (wasting).  · Poor coordination.  · Double or blurred vision.  Proximal neuropathy  · Sudden, severe pain in the hip, thigh, or buttocks. Pain may spread from the back into the legs (sciatica).  · Pain and numbness in the arms and  legs.  · Tingling.  · Loss of bladder control or bowel control.  · Weakness and wasting of thigh muscles.  · Difficulty getting up from a seated position.  · Abdominal swelling.  · Unexplained weight loss.  How is this diagnosed?  Diagnosis usually involves reviewing your medical history and any symptoms you have. Diagnosis varies depending on the type of neuropathy your health care provider suspects.  Peripheral neuropathy  Your health care provider will check areas that are affected by your nervous system (neurologic exam), such as your reflexes, how you move, and what you can feel. You may have other tests, such as:  · Blood tests.  · Removal and examination of fluid that surrounds the spinal cord (lumbar puncture).  · CT scan.  · MRI.  · A test to check the nerves that control muscles (electromyogram, EMG).  · Tests of how quickly messages pass through your nerves (nerve conduction velocity tests).  · Removal of a small piece of nerve to be examined under a microscope (biopsy).  Autonomic neuropathy  You may have tests, such as:  · Tests to measure your blood pressure and heart rate. This may include monitoring you while you are safely secured to an exam table that moves you from a lying position to an upright position (table tilt test).  · Breathing tests to check your lungs.  · Tests to check how food moves through the digestive system (gastric emptying tests).  · Blood, sweat, or urine tests.  · Ultrasound of your bladder.  · Spinal fluid tests.  Focal neuropathy  This condition may be diagnosed with:  · A neurologic exam.  · CT scan.  · MRI.  · EMG.  · Nerve conduction velocity tests.  Proximal neuropathy  There is no test to diagnose this type of neuropathy. You may have tests to rule out other possible causes of this type of neuropathy. Tests may include:  · X-rays of your spine and lumbar region.  · Lumbar puncture.  · MRI.  How is this treated?  The goal of treatment is to keep nerve damage from getting  worse. The most important part of treatment is keeping your blood glucose level and your A1C level within your target range by following your diabetes management plan. Over time, maintaining lower blood glucose levels helps lessen symptoms. In some cases, you may need prescription pain medicine.  Follow these instructions at home:    Lifestyle    · Do not use any products that contain nicotine or tobacco, such as cigarettes and e-cigarettes. If you need help quitting, ask your health care provider.  · Be physically active every day. Include strength training and balance exercises.  · Follow a healthy meal plan.  · Work with your health care provider to manage your blood pressure.  General instructions  · Follow your diabetes management plan as directed.  ? Check your blood glucose levels as directed   by your health care provider.  ? Keep your blood glucose in your target range as directed by your health care provider.  ? Have your A1C level checked at least two times a year, or as often as told by your health care provider.  · Take over the counter and prescription medicines only as told by your health care provider. This includes insulin and diabetes medicine.  · Do not drive or use heavy machinery while taking prescription pain medicines.  · Check your skin and feet every day for cuts, bruises, redness, blisters, or sores.  · Keep all follow up visits as told by your health care provider. This is important.  Contact a health care provider if:  · You have burning, stabbing, or aching pain in your legs or feet.  · You are unable to feel pressure or pain in your feet.  · You develop problems with digestion, such as:  ? Nausea.  ? Vomiting.  ? Bloating.  ? Constipation.  ? Diarrhea.  ? Abdominal pain.  · You have difficulty with urination, such as inability:  ? To control when you urinate (incontinence).  ? To completely empty the bladder (retention).  · You have palpitations.  · You feel dizzy, weak, or faint when you  stand up.  Get help right away if:  · You cannot urinate.  · You have sudden weakness or loss of coordination.  · You have trouble speaking.  · You have pain or pressure in your chest.  · You have an irregular heart beat.  · You have sudden inability to move a part of your body.  Summary  · Diabetic neuropathy refers to nerve damage that is caused by diabetes. It can affect nerves throughout the entire body, causing numbness and pain in the arms, legs, digestive tract, heart, and other body systems.  · Keep your blood glucose level and your blood pressure in your target range, as directed by your health care provider. This can help prevent neuropathy from getting worse.  · Check your skin and feet every day for cuts, bruises, redness, blisters, or sores.  · Do not use any products that contain nicotine or tobacco, such as cigarettes and e-cigarettes. If you need help quitting, ask your health care provider.  This information is not intended to replace advice given to you by your health care provider. Make sure you discuss any questions you have with your health care provider.  Document Released: 08/18/2001 Document Revised: 07/22/2017 Document Reviewed: 07/14/2016  Elsevier Interactive Patient Education © 2019 [employer]

## 2018-07-10 ENCOUNTER — Other Ambulatory Visit: Payer: Self-pay | Admitting: Internal Medicine

## 2018-07-12 NOTE — Telephone Encounter (Signed)
Gabapentin refill

## 2018-07-12 NOTE — Progress Notes (Signed)
Subjective:     Patient ID: [name] [name] , female    DOB: May 01, 1951 , 68 y.o.   MRN: [id number]   Chief Complaint  Patient presents with  . Lyrica f/u    HPI  She is here today for f/u paresthesias upper extremity. She was started on Lyrica 50mg  twice daily at her last visit. She reports that she has had some improvement in her symptoms.     Past Medical History:  Diagnosis Date  . Anemia   . Carpal tunnel syndrome   . Coronary artery disease    DES to ostial RCA 2005  . Diabetes mellitus without complication (HCC)    No longer taking meds  . Dyslipidemia   . Hypertension   . Osteoarthritis   . PAD (peripheral artery disease) (HCC)      Family History  Problem Relation Age of Onset  . CAD Father 103  . CAD Mother 15  . CAD Brother   . Cancer Brother   . CAD Sister   . CAD Brother      Current Outpatient Medications:  .  aspirin 81 MG tablet, Take 81 mg by mouth daily., Disp: , Rfl:  .  BD PEN NEEDLE NANO U/F 32G X 4 MM MISC, USE TO INJECT INSULIN DAILY, Disp: , Rfl: 4 .  beta carotene 53794 UNIT capsule, Take 25,000 Units by mouth daily., Disp: , Rfl:  .  BIOTIN PO, Take by mouth., Disp: , Rfl:  .  Cholecalciferol ([employer] VIT D 5000 HIGH-POTENCY PO), Take 1 tablet by mouth daily. , Disp: , Rfl:  .  CHROMIUM PICOLINATE PO, Take 1,000 mg by mouth daily., Disp: , Rfl:  .  Coenzyme Q10 (COQ-10) 100 MG CAPS, Take by mouth., Disp: , Rfl:  .  Cyanocobalamin (VITAMIN B-12 PO), Take by mouth., Disp: , Rfl:  .  glucosamine-chondroitin 500-400 MG tablet, Take 1 tablet by mouth daily. Reported on 12/17/2015, Disp: , Rfl:  .  glucose blood (ONETOUCH VERIO) test strip, CHECK BLOOD SUGAR THREE TIMES DAILY dx: e11.65, Disp: 150 each, Rfl: 11 .  HYDROcodone-homatropine (HYDROMET) 5-1.5 MG/5ML syrup, Take 5 mLs by mouth every 6 (six) hours as needed for cough., Disp: 120 mL, Rfl: 0 .  MEGARED OMEGA-3 KRILL OIL PO, Take by mouth daily., Disp: , Rfl:  .  metoprolol tartrate (LOPRESSOR)  50 MG tablet, Take 50 mg by mouth 2 (two) times daily., Disp: , Rfl:  .  ONETOUCH DELICA LANCETS 33G MISC, USE AS DIRECTED TO CHECK BLOOD SUGAR THREE TIMES DAILY, Disp: 100 each, Rfl: 0 .  pregabalin (LYRICA) 50 MG capsule, Take 1 capsule (50 mg total) by mouth 2 (two) times daily., Disp: 60 capsule, Rfl: 1 .  rosuvastatin (CRESTOR) 10 MG tablet, TAKE 1 TABLET BY MOUTH DAILY, Disp: 90 tablet, Rfl: 0 .  traZODone (DESYREL) 50 MG tablet, Take 50 mg by mouth as needed. , Disp: , Rfl: 2 .  TRESIBA FLEXTOUCH 200 UNIT/ML SOPN, 44 Units. , Disp: , Rfl: 1 .  vitamin C (ASCORBIC ACID) 500 MG tablet, Take 500 mg by mouth daily., Disp: , Rfl:  .  amLODipine (NORVASC) 10 MG tablet, TAKE 1 TABLET(10 MG) BY MOUTH DAILY, Disp: 90 tablet, Rfl: 1   Allergies  Allergen Reactions  . Latex Rash     Review of Systems  Constitutional: Negative.   Respiratory: Negative.   Cardiovascular: Negative.   Gastrointestinal: Negative.   Neurological: Negative.   Psychiatric/Behavioral: Negative.      Today's  Vitals   06/29/18 1007  BP: (!) 156/90  Pulse: (!) 52  Temp: (!) 97.5 F (36.4 C)  TempSrc: Oral  Weight: 193 lb (87.5 kg)  Height: 5\' 4"  (1.626 m)  PainSc: 8   PainLoc: Generalized   Body mass index is 33.13 kg/m.   Objective:  Physical Exam Vitals signs and nursing note reviewed.  Constitutional:      Appearance: Normal appearance.  HENT:     Head: Normocephalic and atraumatic.  Cardiovascular:     Rate and Rhythm: Normal rate and regular rhythm.     Heart sounds: Normal heart sounds.  Pulmonary:     Effort: Pulmonary effort is normal.     Breath sounds: Normal breath sounds.  Musculoskeletal: Normal range of motion.  Neurological:     General: No focal deficit present.     Mental Status: She is alert.  Psychiatric:        Mood and Affect: Mood normal.         Assessment And Plan:     1. Paresthesia of left upper extremity  Somewhat improved with Lyrica. I will increase her  dose to 75mg  twice daily.   2. Hypertensive heart and renal disease with renal failure, stage 1 through stage 4 or unspecified chronic kidney disease, without heart failure  Uncontrolled. I will increase amlodipine to 10mg  once daily. She will rto in four weeks for re-evaluation.   3. Atherosclerosis of native coronary artery of native heart with angina pectoris (HCC)  Chronic, yet stable. Importance of statin compliance was discussed with the patient.   4. Chronic renal disease, stage II  Chronic. I will check a GFR, Cr at her next visit.   5. Class 1 obesity due to excess calories with serious comorbidity and body mass index (BMI) of 33.0 to 33.9 in adult  She is encouraged to strive for BMI less than 30 to decrease cardiac risk. She is encouraged to walk 30 minutes five days weekly.   [name], MD

## 2018-07-17 ENCOUNTER — Other Ambulatory Visit: Payer: Self-pay | Admitting: Nurse Practitioner

## 2018-07-18 ENCOUNTER — Other Ambulatory Visit: Payer: Self-pay | Admitting: Internal Medicine

## 2018-08-10 ENCOUNTER — Other Ambulatory Visit: Payer: Self-pay | Admitting: Internal Medicine

## 2018-08-10 DIAGNOSIS — Z1231 Encounter for screening mammogram for malignant neoplasm of breast: Secondary | ICD-10-CM

## 2018-08-17 ENCOUNTER — Other Ambulatory Visit: Payer: Self-pay | Admitting: Internal Medicine

## 2018-08-19 ENCOUNTER — Ambulatory Visit (INDEPENDENT_AMBULATORY_CARE_PROVIDER_SITE_OTHER): Payer: Medicare Other | Admitting: Internal Medicine

## 2018-08-19 ENCOUNTER — Encounter: Payer: Self-pay | Admitting: Internal Medicine

## 2018-08-19 VITALS — BP 132/86 | HR 57 | Temp 98.5°F | Ht 64.0 in | Wt 197.6 lb

## 2018-08-19 DIAGNOSIS — I25119 Atherosclerotic heart disease of native coronary artery with unspecified angina pectoris: Secondary | ICD-10-CM

## 2018-08-19 DIAGNOSIS — E1165 Type 2 diabetes mellitus with hyperglycemia: Secondary | ICD-10-CM | POA: Diagnosis not present

## 2018-08-19 DIAGNOSIS — N182 Chronic kidney disease, stage 2 (mild): Secondary | ICD-10-CM | POA: Diagnosis not present

## 2018-08-19 DIAGNOSIS — I1310 Hypertensive heart and chronic kidney disease without heart failure, with stage 1 through stage 4 chronic kidney disease, or unspecified chronic kidney disease: Secondary | ICD-10-CM | POA: Diagnosis not present

## 2018-08-19 DIAGNOSIS — E66811 Obesity, class 1: Secondary | ICD-10-CM | POA: Insufficient documentation

## 2018-08-19 DIAGNOSIS — M2550 Pain in unspecified joint: Secondary | ICD-10-CM | POA: Diagnosis not present

## 2018-08-19 DIAGNOSIS — E1122 Type 2 diabetes mellitus with diabetic chronic kidney disease: Secondary | ICD-10-CM | POA: Diagnosis not present

## 2018-08-19 DIAGNOSIS — Z6833 Body mass index (BMI) 33.0-33.9, adult: Secondary | ICD-10-CM

## 2018-08-19 DIAGNOSIS — J40 Bronchitis, not specified as acute or chronic: Secondary | ICD-10-CM | POA: Diagnosis not present

## 2018-08-19 DIAGNOSIS — Z77120 Contact with and (suspected) exposure to mold (toxic): Secondary | ICD-10-CM

## 2018-08-19 DIAGNOSIS — Z794 Long term (current) use of insulin: Secondary | ICD-10-CM

## 2018-08-19 DIAGNOSIS — E6609 Other obesity due to excess calories: Secondary | ICD-10-CM

## 2018-08-19 LAB — POCT URINALYSIS DIPSTICK
BILIRUBIN UA: NEGATIVE
Blood, UA: NEGATIVE
Glucose, UA: POSITIVE — AB
Ketones, UA: NEGATIVE
Leukocytes, UA: NEGATIVE
Nitrite, UA: NEGATIVE
Protein, UA: NEGATIVE
Spec Grav, UA: 1.02 (ref 1.010–1.025)
Urobilinogen, UA: 0.2 E.U./dL
pH, UA: 6 (ref 5.0–8.0)

## 2018-08-19 LAB — POCT UA - MICROALBUMIN
Creatinine, POC: 200 mg/dL
Microalbumin Ur, POC: 30 mg/L

## 2018-08-19 MED ORDER — TRIAMCINOLONE ACETONIDE 40 MG/ML IJ SUSP
40.0000 mg | Freq: Once | INTRAMUSCULAR | Status: AC
  Administered 2018-08-19: 40 mg via INTRAMUSCULAR

## 2018-08-19 MED ORDER — ALBUTEROL SULFATE (2.5 MG/3ML) 0.083% IN NEBU
2.5000 mg | INHALATION_SOLUTION | Freq: Once | RESPIRATORY_TRACT | Status: AC
  Administered 2018-08-19: 2.5 mg via RESPIRATORY_TRACT

## 2018-08-19 NOTE — Patient Instructions (Signed)
Mediterranean Diet  A Mediterranean diet refers to food and lifestyle choices that are based on the traditions of countries located on the [place]. This way of eating has been shown to help prevent certain conditions and improve outcomes for people who have chronic diseases, like kidney disease and heart disease.  What are tips for following this plan?  Lifestyle   Cook and eat meals together with your family, when possible.   Drink enough fluid to keep your urine clear or pale yellow.   Be physically active every day. This includes:  ? Aerobic exercise like running or swimming.  ? Leisure activities like gardening, walking, or housework.   Get 7-8 hours of sleep each night.   If recommended by your health care provider, drink red wine in moderation. This means 1 glass a day for nonpregnant women and 2 glasses a day for men. A glass of wine equals 5 oz (150 mL).  Reading food labels     Check the serving size of packaged foods. For foods such as rice and pasta, the serving size refers to the amount of cooked product, not dry.   Check the total fat in packaged foods. Avoid foods that have saturated fat or trans fats.   Check the ingredients list for added sugars, such as corn syrup.  Shopping   At the grocery store, buy most of your food from the areas near the walls of the store. This includes:  ? Fresh fruits and vegetables (produce).  ? Grains, beans, nuts, and seeds. Some of these may be available in unpackaged forms or large amounts (in bulk).  ? Fresh seafood.  ? Poultry and eggs.  ? Low-fat dairy products.   Buy whole ingredients instead of prepackaged foods.   Buy fresh fruits and vegetables in-season from local farmers markets.   Buy frozen fruits and vegetables in resealable bags.   If you do not have access to quality fresh seafood, buy precooked frozen shrimp or canned fish, such as tuna, salmon, or sardines.   Buy small amounts of raw or cooked vegetables, salads, or olives from  the deli or salad bar at your store.   Stock your pantry so you always have certain foods on hand, such as olive oil, canned tuna, canned tomatoes, rice, pasta, and beans.  Cooking   Cook foods with extra-virgin olive oil instead of using butter or other vegetable oils.   Have meat as a side dish, and have vegetables or grains as your main dish. This means having meat in small portions or adding small amounts of meat to foods like pasta or stew.   Use beans or vegetables instead of meat in common dishes like chili or lasagna.   Experiment with different cooking methods. Try roasting or broiling vegetables instead of steaming or sauteing them.   Add frozen vegetables to soups, stews, pasta, or rice.   Add nuts or seeds for added healthy fat at each meal. You can add these to yogurt, salads, or vegetable dishes.   Marinate fish or vegetables using olive oil, lemon juice, garlic, and fresh herbs.  Meal planning     Plan to eat 1 vegetarian meal one day each week. Try to work up to 2 vegetarian meals, if possible.   Eat seafood 2 or more times a week.   Have healthy snacks readily available, such as:  ? Vegetable sticks with hummus.  ? Greek yogurt.  ? Fruit and nut trail mix.   Eat balanced   meals throughout the week. This includes:  ? Fruit: 2-3 servings a day  ? Vegetables: 4-5 servings a day  ? Low-fat dairy: 2 servings a day  ? Fish, poultry, or lean meat: 1 serving a day  ? Beans and legumes: 2 or more servings a week  ? Nuts and seeds: 1-2 servings a day  ? Whole grains: 6-8 servings a day  ? Extra-virgin olive oil: 3-4 servings a day   Limit red meat and sweets to only a few servings a month  What are my food choices?   Mediterranean diet  ? Recommended  ? Grains: Whole-grain pasta. Brown rice. Bulgar wheat. Polenta. Couscous. Whole-wheat bread. Oatmeal. Quinoa.  ? Vegetables: Artichokes. Beets. Broccoli. Cabbage. Carrots. Eggplant. Green beans. Chard. Kale. Spinach. Onions. Leeks. Peas. Squash.  Tomatoes. Peppers. Radishes.  ? Fruits: Apples. Apricots. Avocado. Berries. Bananas. Cherries. Dates. Figs. Grapes. Lemons. Melon. Oranges. Peaches. Plums. Pomegranate.  ? Meats and other protein foods: Beans. Almonds. Sunflower seeds. Pine nuts. Peanuts. Cod. Salmon. Scallops. Shrimp. Tuna. Tilapia. Clams. Oysters. Eggs.  ? Dairy: Low-fat milk. Cheese. Greek yogurt.  ? Beverages: Water. Red wine. Herbal tea.  ? Fats and oils: Extra virgin olive oil. Avocado oil. Grape seed oil.  ? Sweets and desserts: Greek yogurt with honey. Baked apples. Poached pears. Trail mix.  ? Seasoning and other foods: Basil. Cilantro. Coriander. Cumin. Mint. Parsley. Sage. Rosemary. Tarragon. Garlic. Oregano. Thyme. Pepper. Balsalmic vinegar. Tahini. Hummus. Tomato sauce. Olives. Mushrooms.  ? Limit these  ? Grains: Prepackaged pasta or rice dishes. Prepackaged cereal with added sugar.  ? Vegetables: Deep fried potatoes (french fries).  ? Fruits: Fruit canned in syrup.  ? Meats and other protein foods: Beef. Pork. Lamb. Poultry with skin. Hot dogs. Bacon.  ? Dairy: Ice cream. Sour cream. Whole milk.  ? Beverages: Juice. Sugar-sweetened soft drinks. Beer. Liquor and spirits.  ? Fats and oils: Butter. Canola oil. Vegetable oil. Beef fat (tallow). Lard.  ? Sweets and desserts: Cookies. Cakes. Pies. Candy.  ? Seasoning and other foods: Mayonnaise. Premade sauces and marinades.  ? The items listed may not be a complete list. Talk with your dietitian about what dietary choices are right for you.  Summary   The Mediterranean diet includes both food and lifestyle choices.   Eat a variety of fresh fruits and vegetables, beans, nuts, seeds, and whole grains.   Limit the amount of red meat and sweets that you eat.   Talk with your health care provider about whether it is safe for you to drink red wine in moderation. This means 1 glass a day for nonpregnant women and 2 glasses a day for men. A glass of wine equals 5 oz (150 mL).  This information  is not intended to replace advice given to you by your health care provider. Make sure you discuss any questions you have with your health care provider.  Document Released: 01/31/2016 Document Revised: 03/04/2016 Document Reviewed: 01/31/2016  [employer]vier Interactive Patient Education  2019 [employer]

## 2018-08-19 NOTE — Progress Notes (Signed)
Subjective:     Patient ID: Charlean Sanfilippo , female    DOB: July 04, 1950 , 68 y.o.   MRN: [id number]   Chief Complaint  Patient presents with  . Diabetes  . Hypertension    HPI  Diabetes  She presents for her follow-up diabetic visit. She has type 2 diabetes mellitus. Her disease course has been worsening. There are no hypoglycemic associated symptoms. Pertinent negatives for diabetes include no blurred vision and no chest pain. There are no hypoglycemic complications. Diabetic complications include nephropathy.  Hypertension  Associated symptoms include shortness of breath. Pertinent negatives include no blurred vision or chest pain.     Past Medical History:  Diagnosis Date  . Anemia   . Carpal tunnel syndrome   . Coronary artery disease    DES to ostial RCA 2005  . Diabetes mellitus without complication ([place])    No longer taking meds  . Dyslipidemia   . Hypertension   . Osteoarthritis   . PAD (peripheral artery disease) (HCC)      Family History  Problem Relation Age of Onset  . CAD Father 54  . CAD Mother 55  . CAD Brother   . Cancer Brother   . CAD Sister   . CAD Brother      Current Outpatient Medications:  .  amLODipine (NORVASC) 10 MG tablet, TAKE 1 TABLET(10 MG) BY MOUTH DAILY, Disp: 90 tablet, Rfl: 1 .  aspirin 81 MG tablet, Take 81 mg by mouth daily., Disp: , Rfl:  .  BD PEN NEEDLE NANO U/F 32G X 4 MM MISC, USE TO INJECT INSULIN DAILY, Disp: 100 each, Rfl: 1 .  beta carotene 25000 UNIT capsule, Take 25,000 Units by mouth daily., Disp: , Rfl:  .  BIOTIN PO, Take by mouth., Disp: , Rfl:  .  Cholecalciferol ([employer] VIT D 5000 HIGH-POTENCY PO), Take 1 tablet by mouth daily. , Disp: , Rfl:  .  CHROMIUM PICOLINATE PO, Take 1,000 mg by mouth daily., Disp: , Rfl:  .  Coenzyme Q10 (COQ-10) 100 MG CAPS, Take by mouth., Disp: , Rfl:  .  Cyanocobalamin (VITAMIN B-12 PO), Take by mouth., Disp: , Rfl:  .  glucosamine-chondroitin 500-400 MG tablet, Take 1 tablet by mouth  daily. Reported on 12/17/2015, Disp: , Rfl:  .  glucose blood (ONETOUCH VERIO) test strip, CHECK BLOOD SUGAR THREE TIMES DAILY dx: e11.65, Disp: 150 each, Rfl: 11 .  metoprolol tartrate (LOPRESSOR) 50 MG tablet, TAKE 1 TABLET BY MOUTH TWICE DAILY, Disp: 180 tablet, Rfl: 4 .  ONETOUCH DELICA LANCETS 78H MISC, USE AS DIRECTED TO CHECK BLOOD SUGAR THREE TIMES DAILY, Disp: 100 each, Rfl: 0 .  pregabalin (LYRICA) 50 MG capsule, Take 1 capsule (50 mg total) by mouth 2 (two) times daily., Disp: 60 capsule, Rfl: 1 .  rosuvastatin (CRESTOR) 10 MG tablet, TAKE 1 TABLET BY MOUTH DAILY, Disp: 90 tablet, Rfl: 0 .  traZODone (DESYREL) 50 MG tablet, TAKE 1 TABLET BY MOUTH EVERY NIGHT AT BEDTIME, Disp: 30 tablet, Rfl: 1 .  TRESIBA FLEXTOUCH 200 UNIT/ML SOPN, 44 Units. , Disp: , Rfl: 1 .  vitamin C (ASCORBIC ACID) 500 MG tablet, Take 500 mg by mouth daily., Disp: , Rfl:  .  HYDROcodone-homatropine (HYDROMET) 5-1.5 MG/5ML syrup, Take 5 mLs by mouth every 6 (six) hours as needed for cough. (Patient not taking: Reported on 08/19/2018), Disp: 120 mL, Rfl: 0   Allergies  Allergen Reactions  . Latex Rash     Review of Systems  Constitutional: Negative.   Eyes: Negative for blurred vision.  Respiratory: Positive for shortness of breath and wheezing.        She c/o SOB/wheezing. She volunteers at school. States she sits next to a moldy window. She feels better when at home. Denies fever/chills. Not going to work this week due to her sx.   Cardiovascular: Negative.  Negative for chest pain.  Gastrointestinal: Negative.   Musculoskeletal: Positive for arthralgias (she c/o generalized joint pains. not sure what causes triggers).  Neurological: Negative.   Psychiatric/Behavioral: Negative.      Today's Vitals   08/19/18 0844  BP: 132/86  Pulse: (!) 57  Temp: 98.5 F (36.9 C)  TempSrc: Oral  SpO2: 98%  Weight: 197 lb 9.6 oz (89.6 kg)  Height: _0  (1.626 m)  PainSc: 8   PainLoc: Generalized   Body mass  index is 33.92 kg/m.   Objective:  Physical Exam Vitals signs and nursing note reviewed.  Constitutional:      Appearance: Normal appearance.  HENT:     Head: Normocephalic and atraumatic.  Cardiovascular:     Rate and Rhythm: Normal rate and regular rhythm.     Heart sounds: Normal heart sounds.  Pulmonary:     Effort: Pulmonary effort is normal.     Breath sounds: Wheezing present.  Skin:    General: Skin is warm.  Neurological:     General: No focal deficit present.     Mental Status: She is alert.  Psychiatric:        Mood and Affect: Mood normal.        Behavior: Behavior normal.         Assessment And Plan:     1. Type 2 diabetes with CKD stage 2, w/ long-term use of insulin   I will check labs as listed below. Importance of regular exercise was discussed with the patient. She is encouraged to incorporate more exercise into her daily routine.  She is advised to walk 30 minutes five days weekly. Pt is reminded that exercise will help to improve her blood sugar control.   - Lipid panel - CMP14+EGFR - Hemoglobin A1c  2. Hypertensive heart and renal disease with renal failure, stage 1 through stage 4 or unspecified chronic kidney disease, without heart failure  Fair control. She is encouraged to incorporate more exercise into her daily routine.   3. Atherosclerosis of native coronary artery of native heart with angina pectoris ([place])  Chronic, she is encouraged to comply with statin therapy.   4. Bronchitis  She was given albuterol treatment with improvement of her breath sounds. Pt advised if SOB does not improve over next several days, she will need cardiac evaluation.   - triamcinolone acetonide (KENALOG-40) injection 40 mg - albuterol (PROVENTIL) (2.5 MG/3ML) 0.083% nebulizer solution 2.5 mg  5. Class 1 obesity due to excess calories with serious comorbidity and body mass index (BMI) of 33.0 to 33.9 in adult  Importance of achieving optimal weight to  decrease risk of cardiovascular disease and cancers was discussed with the patient in full detail. She is encouraged to start slowly - start with 10 minutes twice daily at least three to four days per week and to gradually build to 30 minutes five days weekly. She was given tips to incorporate more activity into her daily routine - take stairs when possible, park farther away from her job, grocery stores, etc.     6. Arthralgia, unspecified joint  I will check an  arthritis panel. She is also reminded that her elevated sugars may be contributing to her symptoms. She is also encouraged to follow an anti-inflammatory diet.   - ANA, IFA (with reflex) - CYCLIC CITRUL PEPTIDE ANTIBODY, IGG/IGA - Rheumatoid factor - Sedimentation rate - Uric acid  7. Exposure to mold   [name], MD

## 2018-08-19 NOTE — Addendum Note (Signed)
Addended by: Mariam Dollar on: 08/19/2018 12:41 PM   Modules accepted: Orders

## 2018-08-20 LAB — CMP14+EGFR
ALT: 11 IU/L (ref 0–32)
AST: 15 IU/L (ref 0–40)
Albumin/Globulin Ratio: 1.4 (ref 1.2–2.2)
Albumin: 4.2 g/dL (ref 3.8–4.8)
Alkaline Phosphatase: 190 IU/L — ABNORMAL HIGH (ref 39–117)
BUN/Creatinine Ratio: 13 (ref 12–28)
BUN: 11 mg/dL (ref 8–27)
Bilirubin Total: 0.3 mg/dL (ref 0.0–1.2)
CALCIUM: 9.1 mg/dL (ref 8.7–10.3)
CO2: 23 mmol/L (ref 20–29)
Chloride: 100 mmol/L (ref 96–106)
Creatinine, Ser: 0.83 mg/dL (ref 0.57–1.00)
GFR calc Af Amer: 84 mL/min/{1.73_m2} (ref >59)
GFR, EST NON AFRICAN AMERICAN: 73 mL/min/{1.73_m2} (ref >59)
Globulin, Total: 3.1 g/dL (ref 1.5–4.5)
Glucose: 299 mg/dL — ABNORMAL HIGH (ref 65–99)
Potassium: 3.6 mmol/L (ref 3.5–5.2)
Sodium: 140 mmol/L (ref 134–144)
Total Protein: 7.3 g/dL (ref 6.0–8.5)

## 2018-08-20 LAB — LIPID PANEL
Chol/HDL Ratio: 3 ratio (ref 0.0–4.4)
Cholesterol, Total: 169 mg/dL (ref 100–199)
HDL: 57 mg/dL (ref >39)
LDL Calculated: 92 mg/dL (ref 0–99)
Triglycerides: 99 mg/dL (ref 0–149)
VLDL Cholesterol Cal: 20 mg/dL (ref 5–40)

## 2018-08-20 LAB — ANTINUCLEAR ANTIBODIES, IFA: ANA Titer 1: NEGATIVE

## 2018-08-20 LAB — RHEUMATOID FACTOR: Rhuematoid fact SerPl-aCnc: 14.1 IU/mL — ABNORMAL HIGH (ref 0.0–13.9)

## 2018-08-20 LAB — CYCLIC CITRUL PEPTIDE ANTIBODY, IGG/IGA: Cyclic Citrullin Peptide Ab: 8 units (ref 0–19)

## 2018-08-20 LAB — SEDIMENTATION RATE: Sed Rate: 61 mm/hr — ABNORMAL HIGH (ref 0–40)

## 2018-08-20 LAB — HEMOGLOBIN A1C
ESTIMATED AVERAGE GLUCOSE: 223 mg/dL
Hgb A1c MFr Bld: 9.4 % — ABNORMAL HIGH (ref 4.8–5.6)

## 2018-08-20 LAB — URIC ACID: Uric Acid: 3.9 mg/dL (ref 2.5–7.1)

## 2018-08-21 ENCOUNTER — Other Ambulatory Visit: Payer: Self-pay | Admitting: Internal Medicine

## 2018-08-21 DIAGNOSIS — Z794 Long term (current) use of insulin: Principal | ICD-10-CM

## 2018-08-21 DIAGNOSIS — Z720 Tobacco use: Secondary | ICD-10-CM

## 2018-08-21 DIAGNOSIS — I25119 Atherosclerotic heart disease of native coronary artery with unspecified angina pectoris: Secondary | ICD-10-CM

## 2018-08-21 DIAGNOSIS — I1310 Hypertensive heart and chronic kidney disease without heart failure, with stage 1 through stage 4 chronic kidney disease, or unspecified chronic kidney disease: Secondary | ICD-10-CM

## 2018-08-21 DIAGNOSIS — N182 Chronic kidney disease, stage 2 (mild): Principal | ICD-10-CM

## 2018-08-21 DIAGNOSIS — E1122 Type 2 diabetes mellitus with diabetic chronic kidney disease: Secondary | ICD-10-CM

## 2018-08-24 ENCOUNTER — Telehealth: Payer: Self-pay

## 2018-08-24 ENCOUNTER — Ambulatory Visit: Payer: Self-pay

## 2018-08-24 DIAGNOSIS — Z794 Long term (current) use of insulin: Principal | ICD-10-CM

## 2018-08-24 DIAGNOSIS — E1122 Type 2 diabetes mellitus with diabetic chronic kidney disease: Secondary | ICD-10-CM

## 2018-08-24 DIAGNOSIS — N182 Chronic kidney disease, stage 2 (mild): Principal | ICD-10-CM

## 2018-08-24 NOTE — Telephone Encounter (Signed)
-----   Message from [name], MD sent at 08/21/2018  2:09 PM EST ----- Your chol is not quite at goal Your LDL, bad chol is 92, ideally this should be less than 70. Are you taking chol meds daily? When she was a smoker, how many ppd did she smoke? hba1c is much higher - 9.4. What has changed in her dier? Please increase insulin by 3 units nightly. Needs to call in 3 days with morning blood sugars so we can further adjust her insulin. Also, I will refer her to a case management nurse - she will help me get her blood sugars to goal.

## 2018-08-24 NOTE — Telephone Encounter (Signed)
Left the pt a message to call back for her most recent lab results.

## 2018-08-24 NOTE — Chronic Care Management (AMB) (Signed)
  Care Management Note   [name] [name] is a 68 y.o. year old female who is a primary care patient of [name], MD . Dr. Allyne Gee asked the CM team to consult with the patient for assistance related to uncontrolled DM and HTN.  Review of patient status, including review of consultants reports, rand collaboration with appropriate care team members and the patient's provider was performed as part of comprehensive patient evaluation and provision of chronic care management services. Telephone outreach to patient today to introduce CCM services.   Unsuccessful outreach to the patient by phone today. SW left a HIPAA compliant voice message requesting a return call.  Follow Up Plan: Appointment scheduled for SW follow up with client by phone on: in the next week regarding CCM referral.   Bevelyn Ngo, BSW, CDP [facility] Care Management Social Worker [phone]

## 2018-08-25 ENCOUNTER — Other Ambulatory Visit: Payer: Self-pay | Admitting: Internal Medicine

## 2018-08-25 ENCOUNTER — Other Ambulatory Visit: Payer: Self-pay

## 2018-08-25 DIAGNOSIS — M2550 Pain in unspecified joint: Secondary | ICD-10-CM

## 2018-08-25 MED ORDER — TRESIBA FLEXTOUCH 200 UNIT/ML ~~LOC~~ SOPN: 47.0000 [IU] | PEN_INJECTOR | Freq: Every day | SUBCUTANEOUS | 1 refills | Status: DC

## 2018-08-26 NOTE — Telephone Encounter (Signed)
Pregabalin 50 mg

## 2018-08-30 ENCOUNTER — Ambulatory Visit: Payer: Self-pay

## 2018-08-30 DIAGNOSIS — Z794 Long term (current) use of insulin: Principal | ICD-10-CM

## 2018-08-30 DIAGNOSIS — I10 Essential (primary) hypertension: Secondary | ICD-10-CM

## 2018-08-30 DIAGNOSIS — E1122 Type 2 diabetes mellitus with diabetic chronic kidney disease: Secondary | ICD-10-CM

## 2018-08-30 DIAGNOSIS — N182 Chronic kidney disease, stage 2 (mild): Secondary | ICD-10-CM

## 2018-08-30 NOTE — Chronic Care Management (AMB) (Signed)
  Chronic Care Management   Social Work Note  08/30/2018 Name: [name] [name] MRN: [id number] DOB: 10/14/1950  [name] is a 68 y.o. year old female who sees [name], MD for primary care. Dr. Allyne Gee asked the CCM team to consult the patient for assistance with diabetes education and community resource needs.  Second unsuccessful outreach to the patient to introduced CCM program. SW left a HIPAA compliant voice message requesting a return call.  Follow Up Plan: SW will plan a third and final outreach to introduce the CCM program to the patient within the next week.  Bevelyn Ngo, BSW, CDP [facility] / [facility] Care Management Social Worker [phone]  Total time spent performing care coordination and/or care management activities with the patient by phone or face to face = 5 minutes.

## 2018-08-31 ENCOUNTER — Ambulatory Visit: Payer: Self-pay

## 2018-08-31 DIAGNOSIS — Z77120 Contact with and (suspected) exposure to mold (toxic): Secondary | ICD-10-CM

## 2018-08-31 DIAGNOSIS — N182 Chronic kidney disease, stage 2 (mild): Principal | ICD-10-CM

## 2018-08-31 DIAGNOSIS — E1122 Type 2 diabetes mellitus with diabetic chronic kidney disease: Secondary | ICD-10-CM

## 2018-08-31 DIAGNOSIS — I10 Essential (primary) hypertension: Secondary | ICD-10-CM

## 2018-08-31 DIAGNOSIS — Z794 Long term (current) use of insulin: Principal | ICD-10-CM

## 2018-08-31 NOTE — Patient Instructions (Signed)
Social Worker Visit Information  Materials provided: Verbal education about CCM program provided by phone  Ms. Olenick was given information about Chronic Care Management services today including:  1. CCM service includes personalized support from designated clinical staff supervised by her physician, including individualized plan of care and coordination with other care providers 2. 24/7 contact phone numbers for assistance for urgent and routine care needs. 3. Service will only be billed when office clinical staff spend 20 minutes or more in a month to coordinate care. 4. Only one practitioner may furnish and bill the service in a calendar month. 5. The patient may stop CCM services at any time (effective at the end of the month) by phone call to the office staff. 6. The patient will be responsible for cost sharing (co-pay) of up to 20% of the service fee (after annual deductible is met).  Patient agreed to services and verbal consent obtained.   The patient verbalized understanding of instructions provided today and declined a print copy of patient instruction materials.   Follow up plan: Face to Face appointment with CCM team member scheduled for: Thursday March 19  Bashir Marchetti, [place], [place] [facility] Care Management Social Worker [phone]

## 2018-08-31 NOTE — Chronic Care Management (AMB) (Signed)
  Chronic Care Management    Clinical Social Work CCM Outreach Note  08/31/2018 Name: [name] [name] MRN: [id number] DOB: [birth year]  [name] [name] is a 68 y.o. year old female who is a primary care patient of Glendale Chard, MD . Dr. Baird Cancer asked the CCM team to consult the patient for assistance with diabetes education and community resource needs. SW received return call from [name] [name] today and introduced the CCM program.  [name] [name] was given information about Chronic Care Management services today including:  1. CCM service includes personalized support from designated clinical staff supervised by her physician, including individualized plan of care and coordination with other care providers 2. 24/7 contact phone numbers for assistance for urgent and routine care needs. 3. Service will only be billed when office clinical staff spend 20 minutes or more in a month to coordinate care. 4. Only one practitioner may furnish and bill the service in a calendar month. 5. The patient may stop CCM services at any time (effective at the end of the month) by phone call to the office staff. 6. The patient will be responsible for cost sharing (co-pay) of up to 20% of the service fee (after annual deductible is met).  Patient agreed to services and verbal consent obtained.  The patient states "I want to participate and get my diabetes under control".  Follow Up Plan: Face to Face appointment with CCM team member scheduled for: Thursday March 19.  Daneen Schick, BSW, CDP [facility] / [facility] Care Management Social Worker [phone]  Total time spent performing care coordination and/or care management activities with the patient by phone or face to face = 10 minutes.

## 2018-09-03 ENCOUNTER — Other Ambulatory Visit: Payer: Self-pay

## 2018-09-03 ENCOUNTER — Ambulatory Visit (HOSPITAL_COMMUNITY)
Admission: RE | Admit: 2018-09-03 | Discharge: 2018-09-03 | Disposition: A | Discharge status: Home / Self Care | Payer: Medicare Other | Source: Ambulatory Visit | Attending: Internal Medicine | Admitting: Internal Medicine

## 2018-09-03 DIAGNOSIS — I2510 Atherosclerotic heart disease of native coronary artery without angina pectoris: Secondary | ICD-10-CM | POA: Insufficient documentation

## 2018-09-03 DIAGNOSIS — J432 Centrilobular emphysema: Secondary | ICD-10-CM | POA: Insufficient documentation

## 2018-09-03 DIAGNOSIS — Z720 Tobacco use: Secondary | ICD-10-CM | POA: Diagnosis present

## 2018-09-03 DIAGNOSIS — I700 Atherosclerosis of aorta: Secondary | ICD-10-CM | POA: Diagnosis not present

## 2018-09-03 DIAGNOSIS — Z87891 Personal history of nicotine dependence: Secondary | ICD-10-CM | POA: Diagnosis not present

## 2018-09-09 ENCOUNTER — Other Ambulatory Visit: Payer: Self-pay

## 2018-09-09 ENCOUNTER — Ambulatory Visit: Payer: Self-pay

## 2018-09-09 ENCOUNTER — Ambulatory Visit (INDEPENDENT_AMBULATORY_CARE_PROVIDER_SITE_OTHER): Payer: Medicare Other

## 2018-09-09 DIAGNOSIS — I10 Essential (primary) hypertension: Secondary | ICD-10-CM | POA: Diagnosis not present

## 2018-09-09 DIAGNOSIS — N182 Chronic kidney disease, stage 2 (mild): Secondary | ICD-10-CM

## 2018-09-09 DIAGNOSIS — I25119 Atherosclerotic heart disease of native coronary artery with unspecified angina pectoris: Secondary | ICD-10-CM

## 2018-09-09 DIAGNOSIS — E1122 Type 2 diabetes mellitus with diabetic chronic kidney disease: Secondary | ICD-10-CM

## 2018-09-09 DIAGNOSIS — Z794 Long term (current) use of insulin: Secondary | ICD-10-CM

## 2018-09-09 NOTE — Chronic Care Management (AMB) (Signed)
Chronic Care Management   Initial Visit Note  09/09/2018 Name: [name] [name] MRN: [id number] DOB: 07-Nov-1950  Referred by: Glendale Chard, MD Reason for referral : Chronic Care Management ([place] )   [name] [name] is a 68 y.o. year old female who is a primary care patient of Glendale Chard, MD. The CCM team was consulted for assistance with chronic disease management and care coordination needs.   Review of patient status, including review of consultants reports, relevant laboratory and other test results, and collaboration with appropriate care team members and the patient's provider was performed as part of comprehensive patient evaluation and provision of chronic care management services.    The CCM team spoke with [name] [name] by telephone today.   Objective:  Lab Results  Component Value Date   HGBA1C 9.4 (H) 08/19/2018   HGBA1C 7.7 (H) 05/13/2018   HGBA1C 10.6 (H) 09/19/2014   Lab Results  Component Value Date   MICROALBUR 30 08/19/2018   LDLCALC 92 08/19/2018   CREATININE 0.83 08/19/2018   BP Readings from Last 3 Encounters:  08/19/18 132/86  06/29/18 (!) 156/90  05/28/18 (!) 150/92    Goals Addressed      Patient Stated   . "I am not able to get a full nights sleep" (pt-stated)       Current Barriers:  Marland Kitchen Knowledge Deficits related to Impaired Sleep Disturbance  Nurse Case Manager Clinical Goal(s):  Marland Kitchen Over the next 30 days, patient will work with RN CM  to address needs related to Impaired Sleep/Insomnia  Interventions:  . Evaluation of current treatment plan related to insomnia and patient's adherence to plan as established by provider. . Reviewed medications with patient and discussed taking her vitamins in the am verses taking in the PM to help avoid sleep disturbance . Discussed plans with patient for ongoing care management follow up and provided patient with direct contact information for care management team  .  Scheduled CCM telephone follow up with patient   Patient Self Care Activities:  . Self administers medications as prescribed . Attends all scheduled provider appointments . Calls pharmacy for medication refills . Attends church or other social activities . Performs ADL's independently . Performs IADL's independently . Calls provider office for new concerns or questions  Initial goal documentation    . "I don't want to be Diabetic anymore" (pt-stated)       Current Barriers:  Marland Kitchen Knowledge Deficits related to disease process and Self Health Management of Diabetes  Nurse Case Manager Clinical Goal(s):  Marland Kitchen Over the next 30 days, patient will work with RN CM to address needs related to weight loss plan and Diabetes  Interventions:  . Evaluation of current treatment plan related to Diabetes management and patient's adherence to plan as established by provider. . Reviewed medications with patient and discussed rationale for use of antidiabetic drug . Discussed plans with patient for ongoing care management follow up and provided patient with direct contact information for care management team . Advised patient, providing education and rationale, to check cbg 2-3 times daily before meals and record, calling RN CM and or Dr. Baird Cancer for findings outside established parameters.   . Scheduled CCM telephone follow up with patient  Patient Self Care Activities:  . Self administers medications as prescribed . Attends all scheduled provider appointments . Calls pharmacy for medication refills . Attends church or other social activities . Performs ADL's independently . Performs IADL's independently . Calls provider office  for new concerns or questions  Initial goal documentation    . "I have a lot of questions about my medications" (pt-stated)       Current Barriers:  Marland Kitchen Knowledge Deficits related to potential Side Effects and or Contraindications associated with patients current medication  regimen  . Polypharmacy  Nurse Case Manager Clinical Goal(s):   Over the next 14 days, a medication review by the clinic PharmD will be completed  Over the next 30 days, patient will verbalize increased knowledge and understanding of the indication for use, prescribed dosage and potential contraindications for each medication she is taking.  Interventions:  . Evaluation of current treatment plan related to patient's prescribed medications and patient's adherence to plan as established by provider. . Provided education to patient re: indication of use for each medication prescribed, and how to take each medication based on clinical pharmacology recommendations . Reviewed medications with patient and discussed the importance of taking all Vitamin supplements in the am verses bedtime and explained the rationale . Collaborated with embedded clinic PharmD Lottie Dawson; requesting to review patient's current medication regimen and make any recommendations related to any contraindications noted . Discussed plans with patient for ongoing care management follow up and provided patient with direct contact information for care management team . Reviewed scheduled/upcoming provider appointments including: upcoming mammography scheduled for 10/01/18 _0 :50 AM . Pharmacy referral for medication review  . Collaboration with Dr. Baird Cancer via in basket re: order for labs; Vitamin D & TSH  . Scheduled follow up call with patient   Patient Self Care Activities:  . Self administers medications as prescribed . Attends all scheduled provider appointments . Calls pharmacy for medication refills . Attends church or other social activities . Performs ADL's independently . Performs IADL's independently . Calls provider office for new concerns or questions  Initial goal documentation      [name] was given information about Chronic Care Management services today including:  1. CCM service includes  personalized support from designated clinical staff supervised by her physician, including individualized plan of care and coordination with other care providers 2. 24/7 contact phone numbers for assistance for urgent and routine care needs. 3. Service will only be billed when office clinical staff spend 20 minutes or more in a month to coordinate care. 4. Only one practitioner may furnish and bill the service in a calendar month. 5. The patient may stop CCM services at any time (effective at the end of the month) by phone call to the office staff. 6. The patient will be responsible for cost sharing (co-pay) of up to 20% of the service fee (after annual deductible is met).  Patient agreed to services and verbal consent obtained.   The CM team will reach out to the patient again over the next 7-14 days.   Barb Merino, RN,CCM Care Management Coordinator [facility]  Direct Phone: [phone]

## 2018-09-09 NOTE — Patient Instructions (Signed)
Visit Information  Goals Addressed      Patient Stated   . "I am not able to get a full nights sleep" (pt-stated)       Current Barriers:  Marland Kitchen Knowledge Deficits related to Impaired Sleep Disturbance  Nurse Case Manager Clinical Goal(s):  Marland Kitchen Over the next 30 days, patient will work with RN CM  to address needs related to Impaired Sleep/Insomnia  Interventions:  . Evaluation of current treatment plan related to insomnia and patient's adherence to plan as established by provider. . Reviewed medications with patient and discussed taking her vitamins in the am verses taking in the PM to help avoid sleep disturbance . Discussed plans with patient for ongoing care management follow up and provided patient with direct contact information for care management team  . Scheduled CCM telephone follow up with patient   Patient Self Care Activities:  . Self administers medications as prescribed . Attends all scheduled provider appointments . Calls pharmacy for medication refills . Attends church or other social activities . Performs ADL's independently . Performs IADL's independently . Calls provider office for new concerns or questions  Initial goal documentation    . "I don't want to be Diabetic anymore" (pt-stated)       Current Barriers:  Marland Kitchen Knowledge Deficits related to disease process and Self Health Management of Diabetes  Nurse Case Manager Clinical Goal(s):  Marland Kitchen Over the next 30 days, patient will work with RN CM to address needs related to weight loss plan and Diabetes  Interventions:  . Evaluation of current treatment plan related to Diabetes management and patient's adherence to plan as established by provider. . Reviewed medications with patient and discussed rationale for use of antidiabetic drug . Discussed plans with patient for ongoing care management follow up and provided patient with direct contact information for care management team . Advised patient, providing education and  rationale, to check cbg 2-3 times daily before meals and record, calling RN CM and or Dr. Allyne Gee for findings outside established parameters.   . Scheduled CCM telephone follow up with patient  Patient Self Care Activities:  . Self administers medications as prescribed . Attends all scheduled provider appointments . Calls pharmacy for medication refills . Attends church or other social activities . Performs ADL's independently . Performs IADL's independently . Calls provider office for new concerns or questions  Initial goal documentation    . "I have a lot of questions about my medications" (pt-stated)       Current Barriers:  Marland Kitchen Knowledge Deficits related to potential Side Effects and or Contraindications associated with patients current medication regimen  . Polypharmacy  Nurse Case Manager Clinical Goal(s):   Over the next 14 days, a medication review by the clinic PharmD will be completed  Over the next 30 days, patient will verbalize increased knowledge and understanding of the indication for use, prescribed dosage and potential contraindications for each medication she is taking.  Interventions:  . Evaluation of current treatment plan related to patient's prescribed medications and patient's adherence to plan as established by provider. . Provided education to patient re: indication of use for each medication prescribed, and how to take each medication based on clinical pharmacology recommendations . Reviewed medications with patient and discussed the importance of taking all Vitamin supplements in the am verses bedtime and explained the rationale . Collaborated with embedded clinic PharmD Vanice Sarah; requesting to review patient's current medication regimen and make any recommendations related to any contraindications noted .  Discussed plans with patient for ongoing care management follow up and provided patient with direct contact information for care management  team . Reviewed scheduled/upcoming provider appointments including: upcoming mammography scheduled for 10/01/18 @9 :50 AM . Pharmacy referral for medication review  . Collaboration with Dr. Allyne Gee via in basket re: order for labs; Vitamin D & TSH  . Scheduled follow up call with patient   Patient Self Care Activities:  . Self administers medications as prescribed . Attends all scheduled provider appointments . Calls pharmacy for medication refills . Attends church or other social activities . Performs ADL's independently . Performs IADL's independently . Calls provider office for new concerns or questions  Initial goal documentation     The patient verbalized understanding of instructions provided today and declined a print copy of patient instruction materials.    The CM team will reach out to the patient again over the next 7-14 days.    [name], RN,CCM Care Management Coordinator [facility] Care Management/[facility]  Direct Phone: [phone]

## 2018-09-09 NOTE — Chronic Care Management (AMB) (Addendum)
Chronic Care Management    Clinical Social Work General Note  09/09/2018 Name: [name] [name] MRN: [id number] DOB: 11/13/1950  [name] [name] is a 68 y.o. year old female who is a primary care patient of Glendale Chard, MD. The CCM was consulted to assist the patient with chronic disease management and care coordination   [name] [name] was given information about Chronic Care Management services today including:  1. CCM service includes personalized support from designated clinical staff supervised by her physician, including individualized plan of care and coordination with other care providers 2. 24/7 contact phone numbers for assistance for urgent and routine care needs. 3. Service will only be billed when office clinical staff spend 20 minutes or more in a month to coordinate care. 4. Only one practitioner may furnish and bill the service in a calendar month. 5. The patient may stop CCM services at any time (effective at the end of the month) by phone call to the office staff. 6. The patient will be responsible for cost sharing (co-pay) of up to 20% of the service fee (after annual deductible is met).  Patient agreed to services and verbal consent obtained.   Review of patient status, including review of consultants reports, relevant laboratory and other test results, and collaboration with appropriate care team members and the patient's provider was performed as part of comprehensive patient evaluation and provision of chronic care management services.    SDOH (Social Determinants of Health) screening performed today. See Care Plan Entry related to challenges with: Financial Strain    The patient has reported concerns of running out food at times throughout the month. The patient lives in a low income apartment complex and has reported income from [place]. The patient was offered information on local food pantry sites. The patient reports she receives a  meal delivery from a local food resource every third Saturday. The patient declined any other resources stating her family assists as needed each month in making sure the patient has food.  The patient reports she does not currently have a healthcare agent. Brief education provided to the patient regarding the importance of establishing a health care power of attorney. The patient declines forms stating she has them and plans to complete at a later date.  Goals Addressed            This Visit's Progress     Patient Stated   . "I get anxiety about my power bill" (pt-stated)       Current Barriers:  . Financial constraints  . Increased power bill since recent apartment renovation . [employer] Program assistance has already been used. Patient unaware of other resources available to assist  Clinical Social Work Clinical Goal(s):  Marland Kitchen Over the next 20 days, patient will work with SW to address concerns related to identifying community resources to assist with utility costs . Over the next 10 days, patient will follow up with [employer] to arrange an energy assessment as directed by SW  Interventions: . Patient interviewed and appropriate assessments performed . Provided patient with information about community resource programs . Discussed plans with patient for ongoing care management follow up and provided patient with direct contact information for care management team  . Placed referral via [id number] to [employer] for financial assistance . Placed referral via [place] to [employer] for financial assistance . Patient educated on the importance of outreaching [employer] to discuss concerns of increased bill amount .  Patient advised to arrange an energy assessment  Patient Self Care Activities:  . Self administers medications as prescribed . Attends all scheduled provider appointments . Calls pharmacy for medication refills . Attends church or other social  activities . Performs ADL's independently  Initial goal documentation          Follow Up Plan: SW will follow up with patient by phone over the next two weeks.       Daneen Schick, BSW, CDP [facility] / [facility] Care Management Social Worker [phone]  Total time spent performing care coordination and/or care management activities with the patient by phone or face to face = 40 minutes.

## 2018-09-09 NOTE — Patient Instructions (Signed)
Social Worker Visit Information  Goals we discussed today:  Goals Addressed            This Visit's Progress     Patient Stated   . "I get anxiety about my power bill" (pt-stated)       Current Barriers:  . Financial constraints  . Increased power bill since recent apartment renovation . [employer] Program assistance has already been used. Patient unaware of other resources available to assist  Clinical Social Work Clinical Goal(s):  Marland Kitchen Over the next 20 days, patient will work with SW to address concerns related to identifying community resources to assist with utility costs . Over the next 10 days, patient will follow up with [employer] to arrange an energy assessment as directed by SW  Interventions: . Patient interviewed and appropriate assessments performed . Provided patient with information about community resource programs . Discussed plans with patient for ongoing care management follow up and provided patient with direct contact information for care management team  . Placed referral via [id number] to [employer] for financial assistance . Placed referral via [place] to [employer] for financial assistance  Patient Self Care Activities:  . Self administers medications as prescribed . Attends all scheduled provider appointments . Calls pharmacy for medication refills . Attends church or other social activities . Performs ADL's independently  Initial goal documentation         Materials provided: Verbal education about community resources and advance directives provided by phone  Ms. Poch was given information about Chronic Care Management services today including:  1. CCM service includes personalized support from designated clinical staff supervised by her physician, including individualized plan of care and coordination with other care providers 2. 24/7 contact phone numbers for assistance for urgent and routine care needs. 3. Service will  only be billed when office clinical staff spend 20 minutes or more in a month to coordinate care. 4. Only one practitioner may furnish and bill the service in a calendar month. 5. The patient may stop CCM services at any time (effective at the end of the month) by phone call to the office staff. 6. The patient will be responsible for cost sharing (co-pay) of up to 20% of the service fee (after annual deductible is met).  Patient agreed to services and verbal consent obtained.   The patient verbalized understanding of instructions provided today and declined a print copy of patient instruction materials.   Follow up plan: SW will follow up with patient by phone over the next two weeks   Daneen Schick, BSW, CDP [facility] / [place] Management Social Worker [phone]

## 2018-09-10 ENCOUNTER — Ambulatory Visit: Payer: Self-pay | Admitting: Pharmacist

## 2018-09-12 ENCOUNTER — Other Ambulatory Visit: Payer: Self-pay | Admitting: Internal Medicine

## 2018-09-13 ENCOUNTER — Ambulatory Visit: Payer: Self-pay | Admitting: Pharmacist

## 2018-09-15 ENCOUNTER — Ambulatory Visit: Payer: Self-pay | Admitting: Pharmacist

## 2018-09-16 ENCOUNTER — Telehealth: Payer: Self-pay

## 2018-09-17 ENCOUNTER — Telehealth: Payer: Self-pay

## 2018-09-21 ENCOUNTER — Telehealth: Payer: Self-pay

## 2018-09-22 ENCOUNTER — Telehealth: Payer: Self-pay

## 2018-09-23 ENCOUNTER — Telehealth: Payer: Self-pay

## 2018-09-27 ENCOUNTER — Telehealth: Payer: Self-pay

## 2018-09-28 ENCOUNTER — Telehealth: Payer: Self-pay

## 2018-09-30 ENCOUNTER — Other Ambulatory Visit: Payer: Self-pay

## 2018-10-01 ENCOUNTER — Ambulatory Visit: Payer: Medicare Other

## 2018-10-04 ENCOUNTER — Other Ambulatory Visit: Payer: Self-pay

## 2018-10-04 ENCOUNTER — Ambulatory Visit: Payer: Self-pay | Admitting: Pharmacist

## 2018-10-04 ENCOUNTER — Telehealth: Payer: Self-pay

## 2018-10-04 DIAGNOSIS — E1122 Type 2 diabetes mellitus with diabetic chronic kidney disease: Secondary | ICD-10-CM

## 2018-10-04 DIAGNOSIS — Z794 Long term (current) use of insulin: Principal | ICD-10-CM

## 2018-10-04 DIAGNOSIS — I10 Essential (primary) hypertension: Secondary | ICD-10-CM

## 2018-10-04 DIAGNOSIS — N182 Chronic kidney disease, stage 2 (mild): Secondary | ICD-10-CM

## 2018-10-05 ENCOUNTER — Ambulatory Visit: Payer: Self-pay

## 2018-10-05 ENCOUNTER — Ambulatory Visit (INDEPENDENT_AMBULATORY_CARE_PROVIDER_SITE_OTHER): Payer: Medicare Other

## 2018-10-05 ENCOUNTER — Telehealth: Payer: Self-pay

## 2018-10-05 DIAGNOSIS — E1122 Type 2 diabetes mellitus with diabetic chronic kidney disease: Secondary | ICD-10-CM | POA: Diagnosis not present

## 2018-10-05 DIAGNOSIS — N182 Chronic kidney disease, stage 2 (mild): Secondary | ICD-10-CM | POA: Diagnosis not present

## 2018-10-05 DIAGNOSIS — Z794 Long term (current) use of insulin: Secondary | ICD-10-CM

## 2018-10-05 DIAGNOSIS — I10 Essential (primary) hypertension: Secondary | ICD-10-CM

## 2018-10-05 NOTE — Patient Instructions (Signed)
Social Worker Visit Information  Goals we discussed today:  Goals Addressed            This Visit's Progress     Patient Stated   . "I get anxiety about my power bill" (pt-stated)   Not on track    Current Barriers:  . Financial constraints  . Increased power bill since recent apartment renovation . [employer] Program assistance has already been used. Patient unaware of other resources available to assist  Clinical Social Work Clinical Goal(s):  Marland Kitchen Over the next 20 days, patient will work with SW to address concerns related to identifying community resources to assist with utility costs . Over the next 10 days, patient will follow up with [employer] to arrange an energy assessment as directed by SW  Interventions: . Telephonic follow up call by CCM SW . Updated the patient on [employer] declining patient referral due to lack of funds . Informed the patient SW still awaiting response from [employer] regarding funding capacity . Encouraged the patient to contact [employer] to request an energy assessment based on patient self reported increase in utility costs . Contacted [employer] to request feedback on previous submitted referral . Provided patient demographic information via phone referral and was informed the patient would be contacted regarding program ability to assist with costs  Patient Self Care Activities:  . Self administers medications as prescribed . Attends all scheduled provider appointments . Calls pharmacy for medication refills . Attends church or other social activities . Performs ADL's independently  Please see past updates related to this goal by clicking on the "Past Updates" button in the selected goal          Materials Provided: Verbal education about community resources provided by phone  Follow Up Plan: SW will follow up with patient by phone over the next 7-10 days   Bevelyn Ngo, [place], CDP [facility] / [facility]  Care Management Social Worker [phone]

## 2018-10-05 NOTE — Chronic Care Management (AMB) (Signed)
  Chronic Care Management    Clinical Social Work Follow Up Note  10/05/2018 Name: [name] [name] MRN: [id number] DOB: April 16, 1951  [name] [name] is a 68 y.o. year old female who is a primary care patient of [name], MD. The CCM team was consulted for assistance with [employer].   Review of patient status, including review of consultants reports, other relevant assessments, and collaboration with appropriate care team members and the patient's provider was performed as part of comprehensive patient evaluation and provision of chronic care management services.     Goals Addressed            This Visit's Progress     Patient Stated   . "I get anxiety about my power bill" (pt-stated)   Not on track    Current Barriers:  . Financial constraints  . Increased power bill since recent apartment renovation . [employer] Program assistance has already been used. Patient unaware of other resources available to assist  Clinical Social Work Clinical Goal(s):  Marland Kitchen Over the next 20 days, patient will work with SW to address concerns related to identifying community resources to assist with utility costs . Over the next 10 days, patient will follow up with [employer] to arrange an energy assessment as directed by SW  Interventions: . Telephonic follow up call by CCM SW . Updated the patient on [employer] declining patient referral due to lack of funds . Informed the patient SW still awaiting response from [employer] regarding funding capacity . Encouraged the patient to contact [employer] to request an energy assessment based on patient self reported increase in utility costs . Contacted [employer] to request feedback on previous submitted referral . Provided patient demographic information via phone referral and was informed the patient would be contacted regarding program ability to assist with costs  Patient Self Care Activities:  . Self  administers medications as prescribed . Attends all scheduled provider appointments . Calls pharmacy for medication refills . Attends church or other social activities . Performs ADL's independently  Please see past updates related to this goal by clicking on the "Past Updates" button in the selected goal          Follow Up Plan: SW will follow up with patient by phone over the next 7-10 days.  Bevelyn Ngo, BSW, CDP [facility] / [facility] Care Management Social Worker [phone]  Total time spent performing care coordination and/or care management activities with the patient by phone or face to face = 20 minutes.

## 2018-10-06 NOTE — Chronic Care Management (AMB) (Signed)
Chronic Care Management   Follow Up Note   10/05/2018 Name: [name] [name] Biasi MRN: [id number] DOB: Sep 26, 1950  Referred by: Dorothyann PengSanders, Robyn, MD Reason for referral : Chronic Care Management (CCM Telephone Follow Up)   [name] [name] Fleck is a 68 y.o. year old female who is a primary care patient of Dorothyann PengSanders, Robyn, MD. The CCM team was consulted for assistance with chronic disease management and care coordination needs.    Review of patient status, including review of consultants reports, relevant laboratory and other test results, and collaboration with appropriate care team members and the patient's provider was performed as part of comprehensive patient evaluation and provision of chronic care management services.    I spoke with Ms. Fuente by telephone today.   Goals Addressed      Patient Stated   . "I am not able to get a full nights sleep" (pt-stated)   On track    Current Barriers:  Marland Kitchen. Knowledge Deficits related to Impaired Sleep Disturbance  Nurse Case Manager Clinical Goal(s):  Marland Kitchen. Over the next 30 days, patient will work with RN CM  to address needs related to Impaired Sleep/Insomnia  Interventions:   CCM Telephone Follow Up with patient  Assessed for persistent insomnia  Advised Dr. Allyne GeeSanders recommended she take Trazodone nightly x 3 weeks   Discussed avoiding caffeine and or other stimulus that may worsen insomnia  Scheduled CCM telephone follow up with patient 2-3 weeks  Patient Self Care Activities:   Verbalizes understanding of education/information provided  . Self administers medications as prescribed . Attends all scheduled provider appointments . Calls pharmacy for medication refills . Attends church or other social activities . Performs ADL's independently . Performs IADL's independently . Calls provider office for new concerns or questions  Please see past updates related to this goal by clicking on the "Past Updates" button in the selected goal     . "I  don't want to be Diabetic anymore" (pt-stated)   On track    Current Barriers:  Marland Kitchen. Knowledge Deficits related to disease process and Self Health Management of Diabetes  Nurse Case Manager Clinical Goal(s):  Marland Kitchen. Over the next 30 days, patient will work with RN CM to address needs related to weight loss plan and Diabetes  Interventions:   CCM Telephone Follow up completed with patient  Assessed for adherence to following her prescribed txt plan for diabetes   Assessed for self monitoring of CBG's (pt reports average am CBG is running between 80's-90's, pt is self monitoring her CBG's twice daily, she is following a diabetic diet & exercising daily within her home)  Positive reinforcement provided for patient adherence with her Diabetes treatment regimen  CCM Telephone Follow Up call scheduled for 2-3 weeks  Patient Self Care Activities:   Verbalizes understanding of the education/information provided  . Self administers medications as prescribed . Attends all scheduled provider appointments . Calls pharmacy for medication refills . Attends church or other social activities . Performs ADL's independently . Performs IADL's independently . Calls provider office for new concerns or questions  Please see past updates related to this goal by clicking on the "Past Updates" button in the selected goal     . "I have a lot of questions about my medications" (pt-stated)   On track    Current Barriers:  Marland Kitchen. Knowledge Deficits related to potential Side Effects and or Contraindications associated with patients current medication regimen  . Polypharmacy  Nurse Case Manager Clinical Goal(s):   Over the  next 14 days, a medication review by the clinic PharmD will be completed  Over the next 30 days, patient will verbalize increased knowledge and understanding of the indication for use, prescribed dosage and potential contraindications for each medication she is taking.  Interventions:   CCM  Telephone Follow Up call completed with patient  Assessed for medication questions or concerns  Confirmed embedded pharmacy referral with Vanice Sarah, PharmD was completed  Sent an in basket request to Vanice Sarah, PharmD requesting she contact member to answer question about adding a new supplement, antioxidant green tea extract multipride w/Vitamin A, C, E & Selenium free radicals . Advised Dr. Allyne Gee approved order for labs; Vitamin D & TSH once safe to come into the office for labs . Scheduled follow up call with patient for 2-3 weeks  Patient Self Care Activities:  . Self administers medications as prescribed . Attends all scheduled provider appointments . Calls pharmacy for medication refills . Attends church or other social activities . Performs ADL's independently . Performs IADL's independently . Calls provider office for new concerns or questions  Please see past updates related to this goal by clicking on the "Past Updates" button in the selected goal         The CM team will reach out to the patient again over the next 2-3 weeks.    [name], RN,CCM Care Management Coordinator [facility] Care Management/[facility]  Direct Phone: [phone]

## 2018-10-06 NOTE — Patient Instructions (Signed)
Visit Information  Goals Addressed      Patient Stated   . "I am not able to get a full nights sleep" (pt-stated)   On track    Current Barriers:  Marland Kitchen Knowledge Deficits related to Impaired Sleep Disturbance  Nurse Case Manager Clinical Goal(s):  Marland Kitchen Over the next 30 days, patient will work with RN CM  to address needs related to Impaired Sleep/Insomnia  Interventions:   CCM Telephone Follow Up with patient  Assessed for persistent insomnia  Advised Dr. Allyne Gee recommended she take Trazodone nightly x 3 weeks   Discussed avoiding caffeine and or other stimulus that may worsen insomnia  Scheduled CCM telephone follow up with patient 2-3 weeks  Patient Self Care Activities:   Verbalizes understanding of education/information provided  . Self administers medications as prescribed . Attends all scheduled provider appointments . Calls pharmacy for medication refills . Attends church or other social activities . Performs ADL's independently . Performs IADL's independently . Calls provider office for new concerns or questions  Please see past updates related to this goal by clicking on the "Past Updates" button in the selected goal     . "I don't want to be Diabetic anymore" (pt-stated)   On track    Current Barriers:  Marland Kitchen Knowledge Deficits related to disease process and Self Health Management of Diabetes  Nurse Case Manager Clinical Goal(s):  Marland Kitchen Over the next 30 days, patient will work with RN CM to address needs related to weight loss plan and Diabetes  Interventions:   CCM Telephone Follow up completed with patient  Assessed for adherence to following her prescribed txt plan for diabetes   Assessed for self monitoring of CBG's (pt reports average am CBG is running between 80's-90's, pt is self monitoring her CBG's twice daily, she is following a diabetic diet & exercising daily within her home)  Positive reinforcement provided for patient adherence with her Diabetes  treatment regimen  CCM Telephone Follow Up call scheduled for 2-3 weeks  Patient Self Care Activities:   Verbalizes understanding of the education/information provided  . Self administers medications as prescribed . Attends all scheduled provider appointments . Calls pharmacy for medication refills . Attends church or other social activities . Performs ADL's independently . Performs IADL's independently . Calls provider office for new concerns or questions  Please see past updates related to this goal by clicking on the "Past Updates" button in the selected goal     . "I have a lot of questions about my medications" (pt-stated)   On track    Current Barriers:  Marland Kitchen Knowledge Deficits related to potential Side Effects and or Contraindications associated with patients current medication regimen  . Polypharmacy  Nurse Case Manager Clinical Goal(s):   Over the next 14 days, a medication review by the clinic PharmD will be completed  Over the next 30 days, patient will verbalize increased knowledge and understanding of the indication for use, prescribed dosage and potential contraindications for each medication she is taking.  Interventions:   CCM Telephone Follow Up call completed with patient  Assessed for medication questions or concerns  Confirmed embedded pharmacy referral with Vanice Sarah, PharmD was completed  Sent an in basket request to Vanice Sarah, PharmD requesting she contact member to answer question about adding a new supplement, antioxidant green tea extract multipride w/Vitamin A, C, E & Selenium free radicals . Advised Dr. Allyne Gee approved order for labs; Vitamin D & TSH once safe to come into the  office for labs . Scheduled follow up call with patient for 2-3 weeks  Patient Self Care Activities:  . Self administers medications as prescribed . Attends all scheduled provider appointments . Calls pharmacy for medication refills . Attends church or other social  activities . Performs ADL's independently . Performs IADL's independently . Calls provider office for new concerns or questions  Please see past updates related to this goal by clicking on the "Past Updates" button in the selected goal        The patient verbalized understanding of instructions provided today and declined a print copy of patient instruction materials.   The CM team will reach out to the patient again over the next 2-3 weeks.   Delsa SaleAngel Daijon Wenke, RN,CCM Care Management Coordinator [facility]  Direct Phone: [phone]

## 2018-10-11 ENCOUNTER — Telehealth: Payer: Self-pay | Admitting: Cardiology

## 2018-10-11 NOTE — Telephone Encounter (Signed)
Mychart link sent via email, smartphone, pre reg complete 10/11/18 AF

## 2018-10-11 NOTE — Progress Notes (Signed)
Virtual Visit via Video Note   This visit type was conducted due to national recommendations for restrictions regarding the COVID-19 Pandemic (e.g. social distancing) in an effort to limit this patient's exposure and mitigate transmission in our community.  Due to her co-morbid illnesses, this patient is at least at moderate risk for complications without adequate follow up.  This format is felt to be most appropriate for this patient at this time.  All issues noted in this document were discussed and addressed.  A limited physical exam was performed with this format.  Please refer to the patient's chart for her consent to tele[facility].   Evaluation Performed:  Follow-up visit  Date:  10/12/2018   ID:  [name] DroneLinda [name], DOB September 24, 1950, MRN [id number]  Patient Location: Home Provider Location: Home  PCP:  Dorothyann PengSanders, Robyn, MD  Cardiologist:  Rollene RotundaJames Tredarius Cobern, MD  Electrophysiologist:  None   Chief Complaint:  Chest pain   History of Present Illness:    [name] [name] is a 68 y.o. female who presents for follow up of CAD.   She had a history of a PCI in 2005.  She had a stress perfusion study in 2013 demonstrated no evidence of ischemia and well-preserved ejection fraction.  In 2018 she had a negative inadequate POET (Plain Old Exercise Treadmill).    Since I last saw her the patient is done relatively well.  She denies any symptoms such as chest pressure or neck discomfort.  She gets some fleeting arm discomfort.  This seems to be mild and sporadic.  There is no associated nausea vomiting or diaphoresis.  It is a shooting pain lasting only a few seconds at a time.  It happens at rest.  She cannot bring it on with activities.  She does try to do a little exercise without bringing this on.  She is not been as active since the virus broke out.  It does not sound like the symptoms are new since her treadmill test in 2018.  She has no new shortness of breath, PND or orthopnea.  She is  not describing any palpitations, presyncope or syncope.  She said no weight gain or edema.  The patient does not have symptoms concerning for COVID-19 infection (fever, chills, cough, or new shortness of breath).    Past Medical History:  Diagnosis Date   Anemia    Carpal tunnel syndrome    Coronary artery disease    DES to ostial RCA 2005   Diabetes mellitus without complication (HCC)    No longer taking meds   Dyslipidemia    Hypertension    Osteoarthritis    PAD (peripheral artery disease) (HCC)    Past Surgical History:  Procedure Laterality Date   APPENDECTOMY     TONSILLECTOMY     WRIST SURGERY       Current Meds  Medication Sig   amLODipine (NORVASC) 10 MG tablet TAKE 1 TABLET(10 MG) BY MOUTH DAILY   aspirin 81 MG tablet Take 81 mg by mouth daily.   BD PEN NEEDLE NANO U/F 32G X 4 MM MISC USE TO INJECT INSULIN DAILY   beta carotene [id number] UNIT capsule Take 25,000 Units by mouth daily.   BIOTIN PO Take 500 mg by mouth daily.    Cholecalciferol ([employer] VIT D 5000 HIGH-POTENCY PO) Take 1 tablet by mouth daily.    CHROMIUM PICOLINATE PO Take 1,000 mg by mouth daily.   Coenzyme Q10 (COQ-10) 100 MG CAPS Take by mouth.  Cyanocobalamin (VITAMIN B-12 PO) Take 500 mg by mouth.    Flaxseed, Linseed, (FLAXSEED OIL) 1000 MG CAPS Take 1 mg by mouth.   glucosamine-chondroitin 500-400 MG tablet Take 1 tablet by mouth daily. Reported on 12/17/2015   glucose blood (ONETOUCH VERIO) test strip CHECK BLOOD SUGAR THREE TIMES DAILY dx: e11.65   Krill Oil 350 MG CAPS Take 350 mg by mouth.   magnesium oxide (MAG-OX) 400 MG tablet Take 400 mg by mouth daily.   metoprolol tartrate (LOPRESSOR) 50 MG tablet TAKE 1 TABLET BY MOUTH TWICE DAILY   ONETOUCH DELICA LANCETS 33G MISC USE AS DIRECTED TO CHECK BLOOD SUGAR THREE TIMES DAILY   pregabalin (LYRICA) 50 MG capsule TAKE 1 CAPSULE(50 MG) BY MOUTH TWICE DAILY   rosuvastatin (CRESTOR) 10 MG tablet TAKE 1 TABLET BY MOUTH  DAILY   traZODone (DESYREL) 50 MG tablet TAKE 1 TABLET BY MOUTH EVERY NIGHT AT BEDTIME (Patient taking differently: at bedtime as needed. )   TRESIBA FLEXTOUCH 200 UNIT/ML SOPN INJECT 34 UNITS UNDER THE SKIN EVERY NIGHT AT BEDTIME, MAX DOSE IS 80 UNITS (Patient taking differently: 48 Units. )   vitamin C (ASCORBIC ACID) 500 MG tablet Take 1,000 mg by mouth 2 (two) times daily.    vitamin E 200 UNIT capsule Take 200 Units by mouth daily.   VITAMINS A C E-ZN PO Take by mouth.     Allergies:   Latex   Social History   Tobacco Use   Smoking status: Former Smoker    Years: 30.00    Types: Cigarettes    Last attempt to quit: 12/20/2013    Years since quitting: 4.8   Smokeless tobacco: Never Used  Substance Use Topics   Alcohol use: Yes    Comment: occ   Drug use: Never     Family Hx: The patient's family history includes CAD in her brother, brother, and sister; CAD (age of onset: 81) in her mother; CAD (age of onset: 71) in her father; Cancer in her brother.  ROS:   Please see the history of present illness.    As stated in the HPI and negative for all other systems.   Prior CV studies:   The following studies were reviewed today:  Labs  Labs/Other Tests and Data Reviewed:    EKG:  No ECG reviewed.  Recent Labs: 08/19/2018: ALT 11; BUN 11; Creatinine, Ser 0.83; Potassium 3.6; Sodium 140   Recent Lipid Panel Lab Results  Component Value Date/Time   CHOL 169 08/19/2018 10:01 AM   TRIG 99 08/19/2018 10:01 AM   HDL 57 08/19/2018 10:01 AM   CHOLHDL 3.0 08/19/2018 10:01 AM   CHOLHDL 3.0 Ratio 09/16/2010 09:37 PM   LDLCALC 92 08/19/2018 10:01 AM    Wt Readings from Last 3 Encounters:  10/12/18 197 lb 6.4 oz (89.5 kg)  08/19/18 197 lb 9.6 oz (89.6 kg)  06/29/18 193 lb (87.5 kg)     Objective:    Vital Signs:  BP 123/66    Pulse 63    Ht  (1.626 m)    Wt 197 lb 6.4 oz (89.5 kg)    BMI 33.88 kg/m    VITAL SIGNS:  reviewed GEN:  no acute distress EYES:   sclerae anicteric, EOMI - Extraocular Movements Intact NEURO:  alert and oriented x 3, no obvious focal deficit PSYCH:  normal affect  ASSESSMENT & PLAN:    CAD:  The patient has no new sypmtoms.  No further cardiovascular testing is  indicated.  We will continue with aggressive risk reduction and meds as listed.  Her chest pain is very atypical I think the pretest probability of obstructive coronary disease is low.  DYSLIPIDEMIA: I am going to increase her Crestor to 20 mg daily.  She will get a lipid and liver profile at her primary care office in about 8 to 10 weeks.  Her LDL was still elevated at 92 with an HDL of 57.  DM: Her A1c was very poorly controlled at 9.2.  She will continue the meds as listed.  HTN: Her blood pressure is well controlled.  She continue the meds as listed.  TOBACCO ABUSE: She has not smoked cigarettes in 5 years.  COVID-19 Education: The signs and symptoms of COVID-19 were discussed with the patient and how to seek care for testing (follow up with PCP or arrange E-visit).  The importance of social distancing was discussed today.  Time:   Today, I have spent 25 minutes with the patient with telehealth technology discussing the above problems.     Medication Adjustments/Labs and Tests Ordered: Current medicines are reviewed at length with the patient today.  Concerns regarding medicines are outlined above.   Tests Ordered: No orders of the defined types were placed in this encounter.   Medication Changes: No orders of the defined types were placed in this encounter.   Disposition:  Follow up 12 months  Signed, Rollene Rotunda, MD  10/12/2018 2:43 PM    [facility]

## 2018-10-12 ENCOUNTER — Encounter: Payer: Self-pay | Admitting: Cardiology

## 2018-10-12 ENCOUNTER — Telehealth (INDEPENDENT_AMBULATORY_CARE_PROVIDER_SITE_OTHER): Payer: Medicare Other | Admitting: Cardiology

## 2018-10-12 VITALS — BP 123/66 | HR 63 | Ht 64.0 in | Wt 197.4 lb

## 2018-10-12 DIAGNOSIS — E785 Hyperlipidemia, unspecified: Secondary | ICD-10-CM

## 2018-10-12 DIAGNOSIS — I25119 Atherosclerotic heart disease of native coronary artery with unspecified angina pectoris: Secondary | ICD-10-CM

## 2018-10-12 DIAGNOSIS — I10 Essential (primary) hypertension: Secondary | ICD-10-CM

## 2018-10-12 MED ORDER — ROSUVASTATIN CALCIUM 20 MG PO TABS: 20.0000 mg | ORAL_TABLET | Freq: Every day | ORAL | 3 refills | Status: DC

## 2018-10-12 NOTE — Patient Instructions (Signed)
Medication Instructions:  INCREASE- Crestor 20 mg daily  If you need a refill on your cardiac medications before your next appointment, please call your pharmacy.  Labwork: None Ordered   Testing/Procedures: None Ordered  Follow-Up: You will need a follow up appointment in 1 Year.  Please call our office 2 months in advance to schedule this appointment.  You may see Rollene Rotunda, MD or one of the following Advanced Practice Providers on your designated Care Team:   Theodore Demark, PA-C . Joni Reining, DNP, ANP      At [facility], you and your health needs are our priority.  As part of our continuing mission to provide you with exceptional heart care, we have created designated Provider Care Teams.  These Care Teams include your primary Cardiologist (physician) and Advanced Practice Providers (APPs -  Physician Assistants and Nurse Practitioners) who all work together to provide you with the care you need, when you need it.  Thank you for choosing [facility] at [facility]!!

## 2018-10-14 ENCOUNTER — Telehealth: Payer: Self-pay

## 2018-10-14 ENCOUNTER — Ambulatory Visit: Payer: Self-pay

## 2018-10-14 DIAGNOSIS — I10 Essential (primary) hypertension: Secondary | ICD-10-CM

## 2018-10-14 DIAGNOSIS — N182 Chronic kidney disease, stage 2 (mild): Secondary | ICD-10-CM

## 2018-10-14 DIAGNOSIS — E1122 Type 2 diabetes mellitus with diabetic chronic kidney disease: Secondary | ICD-10-CM

## 2018-10-14 DIAGNOSIS — Z794 Long term (current) use of insulin: Principal | ICD-10-CM

## 2018-10-14 NOTE — Patient Instructions (Signed)
Social Worker Visit Information   Materials Provided: No. Patient not reached.  Follow Up Plan: SW will follow up with patient by phone over the next 7 days.   Imari Reen, BSW, CDP [facility] Care Management Social Worker [phone]         

## 2018-10-14 NOTE — Chronic Care Management (AMB) (Signed)
  Chronic Care Management   Social Work Note  10/14/2018 Name: [name] [name] MRN: [id number] DOB: September 23, 1950  [name] [name] is a 68 y.o. year old female who sees [name], MD for primary care. The CCM team was consulted for assistance with chronic care management of DM.  I placed an unsuccessful call to the patient in effort to follow up on the progression of patient stated goals. I left a HIPAA compliant voice message for the patient requesting a return call.  Follow Up Plan: SW will follow up with patient by phone over the next 7 days.  Bevelyn Ngo, BSW, CDP [facility] / [facility] Care Management Social Worker [phone]  Total time spent performing care coordination and/or care management activities with the patient by phone or face to face = 5 minutes.

## 2018-10-14 NOTE — Patient Instructions (Signed)
Social Worker Visit Information  Goals we discussed today:  Goals Addressed            This Visit's Progress     Patient Stated   . "I get anxiety about my power bill" (pt-stated)   On track    Current Barriers:  . Financial constraints  . Increased power bill since recent apartment renovation . [employer] Program assistance has already been used. Patient unaware of other resources available to assist  Clinical Social Work Clinical Goal(s):  Marland Kitchen Over the next 20 days, patient will work with SW to address concerns related to identifying community resources to assist with utility costs . Over the next 10 days, patient will follow up with [employer] to arrange an energy assessment as directed by SW  Interventions:  Telephonic outreach by [employer] SW  Discussed patient requested energy assessment to [employer]- the patient reports the company is not scheduling these assessments at this time due to COVID- 19  Communicated outcome of this SW call to [employer] regarding patient referral for utility assistance  Determined the patient has yet to be contacted by this program  Provided the patient with the contact number to [facility]  Encouraged the patient to contact this agency and request assistance with her current utility bill  Patient Self Care Activities:  . Self administers medications as prescribed . Attends all scheduled provider appointments . Calls pharmacy for medication refills . Attends church or other social activities . Performs ADL's independently  Please see past updates related to this goal by clicking on the "Past Updates" button in the selected goal          Materials Provided: Verbal education about Designer, fashion/clothing provided by phone  Follow Up Plan: SW will follow up with patient by phone over the next two weeks   Bevelyn Ngo, [place], CDP [facility] / [facility] Care Management Social Worker [phone]

## 2018-10-14 NOTE — Progress Notes (Signed)
  Chronic Care Management   Initial Visit Note  10/14/2018 Name: [name] [name] MRN: [id number] DOB: [birth year]  Referred by: [name] Peng, MD Reason for referral : Chronic Care Management-->medication management   [name] [name] is a 68 y.o. year old female who is a primary care patient of [name] Peng, MD. The CCM team was consulted for assistance with chronic disease management and care coordination needs.   Review of patient status, including review of consultants reports, relevant laboratory and other test results, and collaboration with appropriate care team members and the patient's provider was performed as part of comprehensive patient evaluation and provision of chronic care management services.    Objective:   Goals Addressed            This Visit's Progress     Patient Stated   . I would like to reduce my A1c  (pt-stated)       Current Barriers:  . Non Adherence to DM health diet/lifestyle (current A1c 9.4% on 08/19/18)  Pharmacist Clinical Goal(s):  [name] [name] Over the next 30 days, patient will demonstrate Improved medication adherence as evidenced by daily medication adherence, FBG <130 and controlled BGs throughout the day  Interventions: . Comprehensive medication review performed. . Discussed plans with patient for ongoing care management follow up and provided patient with direct contact information for care management team  . Encouraged patient to continue taking medications as prescribed.  She states her new insulin regimen (updated in Epic).  She denies sign/SX of hypoglycemia, adverse events.  Patient Self Care Activities:  . Self administers medications as prescribed . Attends all scheduled provider appointments . Calls pharmacy for medication refills  Initial goal documentation    . I would like to review my medications for drug interactions (pt-stated)       Current Barriers:  [name] [name] Knowledge Deficits related to pharmacotherapy  Pharmacist Clinical  Goal(s):  [name] [name] Over the next 21 days, polypharmacy will be reduced as evidenced by decreasing the number of vitamins/supplements patient is taking  Interventions: . Comprehensive medication review performed.  Drug-drug interaction report ran via [employer].  No current DDIs exist. . Advised patient to start taking trazodone regularly as she continues to have sleep issues. Encouraged patient to continue good sleep hygiene. . Corrected dosing of biotin, chromuim, and vitamin B from mg -->mcg.  Also, provided patient with maximum recommended daily dosing of supplements.  Dosing these vitamins in mg provide no added benefit and could potentially be harmful. . Cautioned patient to only take additional supplements & OTC medications as directed by PCP and as needed.  Patient has started taking some of her supplements in the AM to aid in her ability to sleep better at night. [name] [name] Collaboration with provider re: medication management and CCM RN   Patient Self Care Activities:  . Attends all scheduled provider appointments . Calls pharmacy for medication refills . Calls provider office for new concerns or questions  Initial goal documentation     PLAN: -Will continue CCM outreach--> The CM team will reach out to the patient again over the next 7 days.   Kieth Brightly, PharmD, BCPS Clinical Pharmacist, [facility]  [facility]  Direct Dial: [phone]

## 2018-10-14 NOTE — Patient Instructions (Addendum)
Visit Information  Goals Addressed            This Visit's Progress     Patient Stated   . I would like to reduce my A1c  (pt-stated)       Current Barriers:  . Non Adherence to DM health diet/lifestyle (current A1c 9.4% on 08/19/18)  Pharmacist Clinical Goal(s):  Marland Kitchen Over the next 30 days, patient will demonstrate Improved medication adherence as evidenced by daily medication adherence, FBG <130 and controlled BGs throughout the day  Interventions: . Comprehensive medication review performed. . Discussed plans with patient for ongoing care management follow up and provided patient with direct contact information for care management team  . Encouraged patient to continue taking medications as prescribed.  She states her new insulin regimen (updated in Epic).  She denies sign/SX of hypoglycemia, adverse events.  Patient Self Care Activities:  . Self administers medications as prescribed . Attends all scheduled provider appointments . Calls pharmacy for medication refills  Initial goal documentation     . I would like to review my medications for drug interactions (pt-stated)       Current Barriers:  Marland Kitchen Knowledge Deficits related to pharmacotherapy  Pharmacist Clinical Goal(s):  Marland Kitchen Over the next 21 days, polypharmacy will be reduced as evidenced by decreasing the number of vitamins/supplements patient is taking  Interventions: . Comprehensive medication review performed.  Drug-drug interaction report ran via [employer]-Drug.  No current DDIs exist. . Advised patient to start taking trazodone regularly as she continues to have sleep issues. Encouraged patient to continue good sleep hygiene. . Corrected dosing of biotin, chromuim, and vitamin B from mg -->mcg.  Dosing these vitamins in mg could potentially be harmdful . Cautioned patient to only take additional supplements & OTC medications as directed by PCP and as needed.  Patient has started taking all supplements in the AM to aid in  her ability to sleep better at night . Collaboration with provider re: medication management  Patient Self Care Activities:  . Attends all scheduled provider appointments . Calls pharmacy for medication refills . Calls provider office for new concerns or questions  Initial goal documentation        The patient verbalized understanding of instructions provided today and declined a print copy of patient instruction materials.   The CM team will reach out to the patient again over the next 10 days.   Kieth Brightly, PharmD, BCPS Clinical Pharmacist, [facility]  [facility]  Direct Dial: [phone]

## 2018-10-14 NOTE — Chronic Care Management (AMB) (Signed)
  Chronic Care Management    Clinical Social Work Follow Up Note  10/14/2018 Name: [name] [name] MRN: [id number] DOB: 1950-10-11  [name] [name] is a 68 y.o. year old female who is a primary care patient of [name], MD. The CCM team was consulted for assistance with chronic care management and care coordination of resource needs.   Review of patient status, including review of consultants reports, other relevant assessments, and collaboration with appropriate care team members and the patient's provider was performed as part of comprehensive patient evaluation and provision of chronic care management services.    I placed a return call to the patient to assess the progression of patient stated goal. HIPAA identifiers confirmed.   Goals Addressed            This Visit's Progress     Patient Stated   . "I get anxiety about my power bill" (pt-stated)   On track    Current Barriers:  . Financial constraints  . Increased power bill since recent apartment renovation . L[employer] Program assistance has already been used. Patient unaware of other resources available to assist  Clinical Social Work Clinical Goal(s):  Marland Kitchen Over the next 20 days, patient will work with SW to address concerns related to identifying community resources to assist with utility costs . Over the next 10 days, patient will follow up with [employer] to arrange an energy assessment as directed by SW  Interventions:  Telephonic outreach by [employer] SW  Discussed patient requested energy assessment to [employer]- the patient reports the company is not scheduling these assessments at this time due to COVID- 19  Communicated outcome of this SW call to [employer] regarding patient referral for utility assistance  Determined the patient has yet to be contacted by this program  Provided the patient with the contact number to [facility]  Encouraged the patient to contact this agency and request assistance with  her current utility bill  Patient Self Care Activities:  . Self administers medications as prescribed . Attends all scheduled provider appointments . Calls pharmacy for medication refills . Attends church or other social activities . Performs ADL's independently  Please see past updates related to this goal by clicking on the "Past Updates" button in the selected goal        Follow Up Plan: SW will follow up with patient by phone over the next two weeks.  Bevelyn Ngo, BSW, CDP [facility] Care Management Social Worker [phone]  Total time spent performing care coordination and/or care management activities with the patient by phone or face to face = 18 minutes.

## 2018-10-19 ENCOUNTER — Telehealth: Payer: Self-pay

## 2018-10-21 ENCOUNTER — Telehealth: Payer: Self-pay

## 2018-10-28 ENCOUNTER — Ambulatory Visit: Payer: Self-pay

## 2018-10-28 ENCOUNTER — Telehealth: Payer: Self-pay

## 2018-10-28 DIAGNOSIS — Z794 Long term (current) use of insulin: Principal | ICD-10-CM

## 2018-10-28 DIAGNOSIS — N182 Chronic kidney disease, stage 2 (mild): Secondary | ICD-10-CM

## 2018-10-28 DIAGNOSIS — I10 Essential (primary) hypertension: Secondary | ICD-10-CM

## 2018-10-28 DIAGNOSIS — E1122 Type 2 diabetes mellitus with diabetic chronic kidney disease: Secondary | ICD-10-CM

## 2018-10-28 NOTE — Chronic Care Management (AMB) (Signed)
  Chronic Care Management   Social Work Note  10/28/2018 Name: Daenerys Wisor MRN: [id number] DOB: 06-13-1951  Damaya Keim is a 68 y.o. year old female who sees [name], MD for primary care. The CCM team was consulted for assistance with [employer].   I placed an outreach call to the patient to follow up on progression of patient stated goals. The patient reported this was not a good time and requested a return call at a later date.  Follow Up Plan: SW will follow up with patient by phone over the next 7-10 days  Bevelyn Ngo, [place], CDP [facility] Care Management Social Worker [phone]  Total time spent performing care coordination and/or care management activities with the patient by phone or face to face = 5 minutes.

## 2018-11-02 ENCOUNTER — Ambulatory Visit: Payer: Self-pay

## 2018-11-02 ENCOUNTER — Telehealth: Payer: Self-pay

## 2018-11-02 ENCOUNTER — Other Ambulatory Visit: Payer: Self-pay

## 2018-11-02 DIAGNOSIS — I10 Essential (primary) hypertension: Secondary | ICD-10-CM

## 2018-11-02 DIAGNOSIS — M791 Myalgia, unspecified site: Secondary | ICD-10-CM

## 2018-11-02 DIAGNOSIS — E1122 Type 2 diabetes mellitus with diabetic chronic kidney disease: Secondary | ICD-10-CM

## 2018-11-03 ENCOUNTER — Ambulatory Visit (INDEPENDENT_AMBULATORY_CARE_PROVIDER_SITE_OTHER): Payer: Medicare Other

## 2018-11-03 DIAGNOSIS — N182 Chronic kidney disease, stage 2 (mild): Secondary | ICD-10-CM | POA: Diagnosis not present

## 2018-11-03 DIAGNOSIS — I10 Essential (primary) hypertension: Secondary | ICD-10-CM

## 2018-11-03 DIAGNOSIS — Z794 Long term (current) use of insulin: Secondary | ICD-10-CM

## 2018-11-03 DIAGNOSIS — E1122 Type 2 diabetes mellitus with diabetic chronic kidney disease: Secondary | ICD-10-CM

## 2018-11-03 NOTE — Chronic Care Management (AMB) (Signed)
  Chronic Care Management    Clinical Social Work Follow Up Note  11/03/2018 Name: [name] [name] MRN: [id number] DOB: April 09, 1951  [name] [name] is a 68 y.o. year old female who is a primary care patient of Glendale Chard, MD. The CCM team was consulted for assistance with [name].   Review of patient status, including review of consultants reports, other relevant assessments, and collaboration with appropriate care team members and the patient's provider was performed as part of comprehensive patient evaluation and provision of chronic care management services.    I placed a follow up call to the patient on today's date to assess progress of patient stated goal.   Goals Addressed            This Visit's Progress     Patient Stated   . "I get anxiety about my power bill" (pt-stated)   Not on track    Current Barriers:  . Financial constraints  . Increased power bill since recent apartment renovation . [employer]IE[employer] Program assistance has already been used. Patient unaware of other resources available to assist  Clinical Social Work Clinical Goal(s):  Marland Kitchen Over the next 20 days, patient will work with SW to address concerns related to identifying community resources to assist with utility costs not met - re-established 5/13 . Over the next 10 days, patient will follow up with [employer] to arrange an energy assessment as directed by SW completed  Interventions:  Telephonic outreach by [employer] SW  Reviewed goal with the patient who reports she has yet to receive a return call from [facility] regarding utility assistance  Reviewed referral sent via [place] with the patient to determine a note had been added by [facility] indicating they had attempted to contact the patient on 5/6 and left a voice message  Provided the patient with the contact number to [facility]  Encouraged the patient to contact this resource today in reference to referral sent by CCM SW   Patient Self Care Activities:  . Self administers medications as prescribed . Attends all scheduled provider appointments . Calls pharmacy for medication refills . Attends church or other social activities . Performs ADL's independently  Please see past updates related to this goal by clicking on the "Past Updates" button in the selected goal          Follow Up Plan: SW will follow up with patient by phone over the next two weeks   Daneen Schick, BSW, CDP [facility] Care Management Social Worker [phone]  Total time spent performing care coordination and/or care management activities with the patient by phone or face to face = 12 minutes.

## 2018-11-03 NOTE — Chronic Care Management (AMB) (Signed)
Chronic Care Management   Follow Up Note   11/02/2018 Name: [name] [name] MRN: [id number] DOB: May 31, 1951  Referred by: Glendale Chard, MD Reason for referral : Chronic Care Management (CCM RN Telephone Follow Up )   [name] [name] is a 68 y.o. year old female who is a primary care patient of Glendale Chard, MD. The CCM team was consulted for assistance with chronic disease management and care coordination needs.    Review of patient status, including review of consultants reports, relevant laboratory and other test results, and collaboration with appropriate care team members and the patient's provider was performed as part of comprehensive patient evaluation and provision of chronic care management services.    I spoke with Ms. Mcauley today by telephone to follow up on her RA and Diabetes management.   Goals Addressed      Patient Stated   . COMPLETED: "I am not able to get a full nights sleep" (pt-stated)       Current Barriers:  Marland Kitchen Knowledge Deficits related to Impaired Sleep Disturbance  Nurse Case Manager Clinical Goal(s):  Marland Kitchen Over the next 30 days, patient will work with RN CM  to address needs related to Impaired Sleep/Insomnia  Interventions:   CCM Telephone Follow Up with patient  Assessed for persistent insomnia  Assessed for patient adherence and effectiveness of taking Trazodone as prescribed for insomnia (pt reports consistently taking this medication each night and states her insomnia has resolved)  Encouraged patient to keep Dr. Baird Cancer informed of ineffectiveness of Trazodone if occurs  Goal Met   Patient Self Care Activities:   Verbalizes understanding of education/information provided  . Self administers medications as prescribed . Attends all scheduled provider appointments . Calls pharmacy for medication refills . Attends church or other social activities . Performs ADL's independently . Performs IADL's independently . Calls provider office for  new concerns or questions  Please see past updates related to this goal by clicking on the "Past Updates" button in the selected goal      . "I don't want to be Diabetic anymore" (pt-stated)   On track    Current Barriers:  Marland Kitchen Knowledge Deficits related to disease process and Self Health Management of Diabetes  Nurse Case Manager Clinical Goal(s):  Marland Kitchen Over the next 30 days, patient will work with RN CM to address needs related to weight loss plan and Diabetes  Interventions:   CCM Telephone Follow up completed with patient  Assessed for adherence to following her prescribed txt plan for diabetes   Assessed for self monitoring of CBG's (pt reports average am CBG is running between 80's-90's, pt is self monitoring her CBG's twice daily, she is following a diabetic diet & exercising daily within her home)  Positive reinforcement provided for patient adherence with her Diabetes treatment regimen  Education provided related to portion control and using the plate method to help adhere to plate method  Mailed printed diabetes educational materials; Meal Planning using the Plate Method, Know Your A1C; Signs and Symptoms of Hypo/Hyperglycemia; RA & DMARDs  CCM Telephone Follow Up call scheduled for 2-3 weeks  Patient Self Care Activities:   Verbalizes understanding of the education/information provided  . Self administers medications as prescribed . Attends all scheduled provider appointments . Calls pharmacy for medication refills . Attends church or other social activities . Performs ADL's independently . Performs IADL's independently . Calls provider office for new concerns or questions  Please see past updates related to this goal by clicking  on the "Past Updates" button in the selected goal      . COMPLETED: "I have a lot of questions about my medications" (pt-stated)       Current Barriers:  Marland Kitchen Knowledge Deficits related to potential Side Effects and or Contraindications  associated with patients current medication regimen  . Polypharmacy  Nurse Case Manager Clinical Goal(s):   Over the next 14 days, a medication review by the clinic PharmD will be completed  Over the next 30 days, patient will verbalize increased knowledge and understanding of the indication for use, prescribed dosage and potential contraindications for each medication she is taking.  Interventions:   CCM Telephone Follow Up call completed with patient  Assessed for medication questions or concerns-pt denies  Encouraged patient to keep Dr. Baird Cancer informed of any new concerns related to cost of meds, effectiveness of meds and or issues with medication management-pt agreed  Goal Met   Patient Self Care Activities:  . Self administers medications as prescribed . Attends all scheduled provider appointments . Calls pharmacy for medication refills . Attends church or other social activities . Performs ADL's independently . Performs IADL's independently . Calls provider office for new concerns or questions  Please see past updates related to this goal by clicking on the "Past Updates" button in the selected goal     . "I would like to know more about DMARDs" (pt-stated)       Current Barriers:  Marland Kitchen Knowledge Deficits related to disease process and pharmacological treatment for Rheumatoid Arthritis  Nurse Case Manager Clinical Goal(s):  Marland Kitchen Over the next 30 days, patient will attend all scheduled medical appointments: with Rheumatologist, Dr. Estanislado Pandy on 11/12/18.  Marland Kitchen Over the next 30 days, patient will verbalize increased knowledge and understanding of the RA disease process and treatment recommendations to best manage this disease.   Interventions:  . Evaluation of current treatment plan related to RA and patient's adherence to plan as established by provider . Provided education to patient re: basic disease process for RA and reason to treat with a DMARD to slow the progression of the  disease . Discussed plans with patient for ongoing care management follow up and provided patient with direct contact information for care management team . Provided patient with printed educational materials related to RA and DMARDs . Confirmed patient has RNCM contact # and discussed nurse availability . Planned a call back to patient for 2-3 weeks to review printed patient ed mats  Patient Self Care Activities:  . Self administers medications as prescribed . Attends all scheduled provider appointments . Calls pharmacy for medication refills . Performs ADL's independently . Performs IADL's independently . Calls provider office for new concerns or questions  Initial goal documentation        The CCM team will reach out to the patient again over the next 2-3 weeks.    Barb Merino, RN,CCM Care Management Coordinator [facility]  Direct Phone: [phone]

## 2018-11-03 NOTE — Progress Notes (Signed)
Office Visit Note  Patient: [name] [name]             Date of Birth: 1950/10/25           MRN: [id number]             PCP: [name], MD Referring: [name], MD Visit Date: 11/12/2018 Occupation: @  Subjective:  Pain in both hands   History of Present Illness: [name] [name] is a 68 y.o. female with history of osteoarthritis of hands, knees, and feet.  She reports that she has intermittent pain and stiffness in both hands but denies any joint swelling.  She states that at times she wears her [facility] joint brace if she is having increased discomfort.  She experiences some discomfort in both knee joints especially when getting up from a chair.  She experiences stiffness in both knees if she sits for prolonged peers of time.  She states that she is starting to walk for exercise and will be starting "groove" dancing exercises once she receives the videos.  She states that she has pedal edema bilaterally.  She denies any other joint pain or joint swelling at this time.   Activities of Daily Living:  Patient reports morning stiffness for 15 minutes.   Patient Denies nocturnal pain.  Difficulty dressing/grooming: Denies Difficulty climbing stairs: Denies Difficulty getting out of chair: Reports Difficulty using hands for taps, buttons, cutlery, and/or writing: Reports  Review of Systems  Constitutional: Positive for fatigue.  HENT: Negative for mouth sores, mouth dryness and nose dryness.   Eyes: Positive for dryness. Negative for pain and visual disturbance.  Respiratory: Negative for cough, hemoptysis, shortness of breath and difficulty breathing.   Cardiovascular: Positive for swelling in legs/feet. Negative for chest pain, palpitations and hypertension.  Gastrointestinal: Positive for constipation. Negative for blood in stool and diarrhea.  Endocrine: Negative for increased urination.  Genitourinary: Negative for difficulty urinating and painful urination.   Musculoskeletal: Positive for arthralgias, joint pain, joint swelling, morning stiffness and muscle tenderness. Negative for myalgias, muscle weakness and myalgias.  Skin: Negative for color change, pallor, rash, hair loss, nodules/bumps, skin tightness, ulcers and sensitivity to sunlight.  Allergic/Immunologic: Negative for susceptible to infections.  Neurological: Negative for dizziness, headaches and weakness.  Hematological: Negative for bruising/bleeding tendency and swollen glands.  Psychiatric/Behavioral: Negative for depressed mood and sleep disturbance. The patient is not nervous/anxious.     PMFS History:  Patient Active Problem List   Diagnosis Date Noted  . Hypertensive heart and renal disease 08/19/2018  . Class 1 obesity due to excess calories with serious comorbidity and body mass index (BMI) of 33.0 to 33.9 in adult 08/19/2018  . Paresthesia of left upper extremity 06/29/2018  . Atherosclerosis of native coronary artery of native heart with angina pectoris (HCC) 04/09/2018  . Dyslipidemia 04/09/2018  . Primary osteoarthritis of both feet 11/05/2017  . Primary osteoarthritis of both hands 10/30/2017  . Primary osteoarthritis of both knees 10/30/2017  . Primary insomnia 10/30/2017  . Family history of lupus erythematosus 10/30/2017  . Dyspnea on exertion 12/10/2016  . Type 2 diabetes mellitus with stage 2 chronic kidney disease, with long-term current use of insulin (HCC) 09/21/2014  . Acute cystitis 09/21/2014  . Hyperglycemia 09/19/2014  . Bradycardia 09/19/2014  . Hyponatremia 09/19/2014  . Elevated alkaline phosphatase level 09/19/2014  . Acute kidney injury (HCC)   . Essential hypertension, benign 07/05/2008  . Coronary atherosclerosis 07/05/2008  . TOBACCO USER 07/03/2008    Past Medical  History:  Diagnosis Date  . Anemia   . Carpal tunnel syndrome   . Coronary artery disease    DES to ostial RCA 2005  . Diabetes mellitus without complication (HCC)    No  longer taking meds  . Dyslipidemia   . Hypertension   . Osteoarthritis   . PAD (peripheral artery disease) (HCC)     Family History  Problem Relation Age of Onset  . CAD Father 2863  . CAD Mother [id number]  . CAD Brother   . Cancer Brother   . CAD Sister   . CAD Brother    Past Surgical History:  Procedure Laterality Date  . APPENDECTOMY    . heart stent    . TONSILLECTOMY    . WRIST SURGERY     Social History   Social History Narrative   Lives alone.   One son died in a car accident.     Immunization History  Administered Date(s) Administered  . Influenza, High Dose Seasonal PF 03/11/2018     Objective: Vital Signs: BP 117/69 (BP Location: Left Arm, Patient Position: Sitting, Cuff Size: Normal)   Pulse 61   Resp 14   Ht 5' 4.5" (1.638 m)   Wt 198 lb 6.4 oz (90 kg)   BMI 33.53 kg/m    Physical Exam Vitals signs and nursing note reviewed.  Constitutional:      Appearance: She is well-developed.  HENT:     Head: Normocephalic and atraumatic.  Eyes:     Conjunctiva/sclera: Conjunctivae normal.  Neck:     Musculoskeletal: Normal range of motion.  Cardiovascular:     Rate and Rhythm: Normal rate and regular rhythm.     Heart sounds: Normal heart sounds.  Pulmonary:     Effort: Pulmonary effort is normal.     Breath sounds: Normal breath sounds.  Abdominal:     General: Bowel sounds are normal.     Palpations: Abdomen is soft.  Lymphadenopathy:     Cervical: No cervical adenopathy.  Skin:    General: Skin is warm and dry.     Capillary Refill: Capillary refill takes less than 2 seconds.  Neurological:     Mental Status: She is alert and oriented to person, place, and time.  Psychiatric:        Behavior: Behavior normal.      Musculoskeletal Exam: C-spine, thoracic spine, spine good range of motion.  No midline spinal tenderness.  No SI joint tenderness.  Shoulder joints have good range of motion.  She has some discomfort with right shoulder range of motion.   Elbow joints, wrist joints, MCPs, PIPs, DIPs good range of motion with no synovitis.  She has complete fist formation bilaterally.  Hip joints have good range of motion with no discomfort.  No tenderness over trochanteric bursa.  Knee joints, ankle joints, MTPs and PIPs and DIPs good range of motion no synovitis.  No warmth or effusion of bilateral knee joints.  Pedal edema noted bilaterally.  CDAI Exam: CDAI Score: Not documented Patient Global Assessment: Not documented; Provider Global Assessment: Not documented Swollen: Not documented; Tender: Not documented Joint Exam   Not documented   There is currently no information documented on the homunculus. Go to the Rheumatology activity and complete the homunculus joint exam.  Investigation: No additional findings.  Imaging: No results found.  Recent Labs: Lab Results  Component Value Date   WBC 6.2 09/20/2014   HGB 10.6 (L) 09/20/2014   PLT 199 09/20/2014  NA 140 08/19/2018   K 3.6 08/19/2018   CL 100 08/19/2018   CO2 23 08/19/2018   GLUCOSE 299 (H) 08/19/2018   BUN 11 08/19/2018   CREATININE 0.83 08/19/2018   BILITOT 0.3 08/19/2018   ALKPHOS 190 (H) 08/19/2018   AST 15 08/19/2018   ALT 11 08/19/2018   PROT 7.3 08/19/2018   ALBUMIN 4.2 08/19/2018   CALCIUM 9.1 08/19/2018   GFRAA 84 08/19/2018    Speciality Comments: No specialty comments available.  Procedures:  No procedures performed Allergies: Latex   Assessment / Plan:     Visit Diagnoses: Primary osteoarthritis of both hands: She has PIP and DIP synovial thickening consistent with osteoarthritis of both hands.  No tenderness or synovitis noted. Joint protection and muscle strengthening were discussed.  She was given a handout of hand exercises to perform.  She was advised to notify us if she develops increased joint pain or joint swelling.    Primary osteoarthritis of both knees: No warmth or effusion of knee joints.  She has good ROM with no discomfort.  She  experiences intermittent pain in both knee joints.  She has had recent weight gain.  We discussed the importance of weight loss.  She is planning on walking and dancing for exercise. She was also given a handout of knee joint exercises.     Primary osteoarthritis of both feet: No joint swelling or tenderness.  Pedal edema bilaterally.  She wears proper fitting shoes.    Other medical conditions are listed as follows:   Uncontrolled diabetes mellitus type 2 without complications (HCC)  Atherosclerosis of native coronary artery of native heart with angina pectoris (HCC)  Essential hypertension, benign  Primary insomnia  Family history of lupus erythematosus  Chronic kidney disease, stage 2 (mild)   Orders: No orders of the defined types were placed in this encounter.  No orders of the defined types were placed in this encounter.    Follow-Up Instructions: Return in about 1 year (around 11/12/2019) for Osteoarthritis.   Gearldine Bienenstock, PA-C   I examined and evaluated the patient with Sherron Ales PA.  Patient continues to have some discomfort due to underlying osteoarthritis.  She had no synovitis on examination.  The plan of care was discussed as noted above.  Pollyann Savoy, MD  Note - This record has been created using Animal nutritionist.  Chart creation errors have been sought, but may not always  have been located. Such creation errors do not reflect on  the standard of medical care.

## 2018-11-03 NOTE — Patient Instructions (Signed)
Social Worker Visit Information  Goals we discussed today:  Goals Addressed            This Visit's Progress     Patient Stated   . "I get anxiety about my power bill" (pt-stated)   Not on track    Current Barriers:  . Financial constraints  . Increased power bill since recent apartment renovation . LIE[employer] Program assistance has already been used. Patient unaware of other resources available to assist  Clinical Social Work Clinical Goal(s):  Marland Kitchen Over the next 20 days, patient will work with SW to address concerns related to identifying community resources to assist with utility costs not met - re-established 5/13 . Over the next 10 days, patient will follow up with [employer] to arrange an energy assessment as directed by SW completed  Interventions:  Telephonic outreach by [employer] SW  Reviewed goal with the patient who reports she has yet to receive a return call from [facility]) regarding utility assistance  Reviewed referral sent via [place] with the patient to determine a note had been added by [facility] indicating they had attempted to contact the patient on 5/6 and left a voice message  Provided the patient with the contact number to [facility]  Encouraged the patient to contact this resource today in reference to referral sent by CCM SW  Patient Self Care Activities:  . Self administers medications as prescribed . Attends all scheduled provider appointments . Calls pharmacy for medication refills . Attends church or other social activities . Performs ADL's independently  Please see past updates related to this goal by clicking on the "Past Updates" button in the selected goal          Materials Provided: No: Patient declined  Follow Up Plan: SW will follow up with patient by phone over the next two weeks   Daneen Schick, BSW, CDP [facility] [facility] Social Worker [phone]

## 2018-11-03 NOTE — Patient Instructions (Signed)
Visit Information  Goals Addressed      Patient Stated   . COMPLETED: "I am not able to get a full nights sleep" (pt-stated)       Current Barriers:  Marland Kitchen Knowledge Deficits related to Impaired Sleep Disturbance  Nurse Case Manager Clinical Goal(s):  Marland Kitchen Over the next 30 days, patient will work with RN CM  to address needs related to Impaired Sleep/Insomnia  Interventions:   CCM Telephone Follow Up with patient  Assessed for persistent insomnia  Assessed for patient adherence and effectiveness of taking Trazodone as prescribed for insomnia (pt reports consistently taking this medication each night and states her insomnia has resolved)  Encouraged patient to keep Dr. Baird Cancer informed of ineffectiveness of Trazodone if occurs  Goal Met   Patient Self Care Activities:   Verbalizes understanding of education/information provided  . Self administers medications as prescribed . Attends all scheduled provider appointments . Calls pharmacy for medication refills . Attends church or other social activities . Performs ADL's independently . Performs IADL's independently . Calls provider office for new concerns or questions  Please see past updates related to this goal by clicking on the "Past Updates" button in the selected goal      . "I don't want to be Diabetic anymore" (pt-stated)   On track    Current Barriers:  Marland Kitchen Knowledge Deficits related to disease process and Self Health Management of Diabetes  Nurse Case Manager Clinical Goal(s):  Marland Kitchen Over the next 30 days, patient will work with RN CM to address needs related to weight loss plan and Diabetes  Interventions:   CCM Telephone Follow up completed with patient  Assessed for adherence to following her prescribed txt plan for diabetes   Assessed for self monitoring of CBG's (pt reports average am CBG is running between 80's-90's, pt is self monitoring her CBG's twice daily, she is following a diabetic diet & exercising daily  within her home)  Positive reinforcement provided for patient adherence with her Diabetes treatment regimen  Education provided related to portion control and using the plate method to help adhere to plate method  Mailed printed diabetes educational materials; Meal Planning using the Plate Method, Know Your A1C; Signs and Symptoms of Hypo/Hyperglycemia; RA & DMARDs  CCM Telephone Follow Up call scheduled for 2-3 weeks  Patient Self Care Activities:   Verbalizes understanding of the education/information provided  . Self administers medications as prescribed . Attends all scheduled provider appointments . Calls pharmacy for medication refills . Attends church or other social activities . Performs ADL's independently . Performs IADL's independently . Calls provider office for new concerns or questions  Please see past updates related to this goal by clicking on the "Past Updates" button in the selected goal      . COMPLETED: "I have a lot of questions about my medications" (pt-stated)       Current Barriers:  Marland Kitchen Knowledge Deficits related to potential Side Effects and or Contraindications associated with patients current medication regimen  . Polypharmacy  Nurse Case Manager Clinical Goal(s):   Over the next 14 days, a medication review by the clinic PharmD will be completed  Over the next 30 days, patient will verbalize increased knowledge and understanding of the indication for use, prescribed dosage and potential contraindications for each medication she is taking.  Interventions:   CCM Telephone Follow Up call completed with patient  Assessed for medication questions or concerns-pt denies  Encouraged patient to keep Dr. Baird Cancer informed of  any new concerns related to cost of meds, effectiveness of meds and or issues with medication management-pt agreed  Goal Met  Patient Self Care Activities:  . Self administers medications as prescribed . Attends all scheduled  provider appointments . Calls pharmacy for medication refills . Attends church or other social activities . Performs ADL's independently . Performs IADL's independently . Calls provider office for new concerns or questions  Please see past updates related to this goal by clicking on the "Past Updates" button in the selected goal     . "I would like to know more about DMARDs" (pt-stated)       Current Barriers:  Marland Kitchen Knowledge Deficits related to disease process and pharmacological treatment for Rheumatoid Arthritis  Nurse Case Manager Clinical Goal(s):  Marland Kitchen Over the next 30 days, patient will attend all scheduled medical appointments: with Rheumatologist, Dr. Estanislado Pandy on 11/12/18.  Marland Kitchen Over the next 30 days, patient will verbalize increased knowledge and understanding of the RA disease process and treatment recommendations to best manage this disease.   Interventions:  . Evaluation of current treatment plan related to RA and patient's adherence to plan as established by provider . Provided education to patient re: basic disease process for RA and reason to treat with a DMARD to slow the progression of the disease . Discussed plans with patient for ongoing care management follow up and provided patient with direct contact information for care management team . Provided patient with printed educational materials related to RA and DMARDs . Confirmed patient has RNCM contact # and discussed nurse availability . Planned a call back to patient for 2-3 weeks to review printed patient ed mats  Patient Self Care Activities:  . Self administers medications as prescribed . Attends all scheduled provider appointments . Calls pharmacy for medication refills . Performs ADL's independently . Performs IADL's independently . Calls provider office for new concerns or questions  Initial goal documentation       The patient verbalized understanding of instructions provided today and declined a print copy  of patient instruction materials.   The CM team will reach out to the patient again over the next 2-3 weeks.   Barb Merino, RN,CCM Care Management Coordinator [facility]  Direct Phone: [phone]

## 2018-11-08 ENCOUNTER — Ambulatory Visit: Payer: Medicare Other | Admitting: Rheumatology

## 2018-11-12 ENCOUNTER — Other Ambulatory Visit: Payer: Self-pay

## 2018-11-12 ENCOUNTER — Ambulatory Visit (INDEPENDENT_AMBULATORY_CARE_PROVIDER_SITE_OTHER): Payer: Medicare Other | Admitting: Rheumatology

## 2018-11-12 ENCOUNTER — Ambulatory Visit: Payer: Self-pay

## 2018-11-12 ENCOUNTER — Encounter: Payer: Self-pay | Admitting: Rheumatology

## 2018-11-12 VITALS — BP 117/69 | HR 61 | Resp 14 | Ht 64.5 in | Wt 198.4 lb

## 2018-11-12 DIAGNOSIS — M19041 Primary osteoarthritis, right hand: Secondary | ICD-10-CM

## 2018-11-12 DIAGNOSIS — Z599 Problem related to housing and economic circumstances, unspecified: Secondary | ICD-10-CM

## 2018-11-12 DIAGNOSIS — M19072 Primary osteoarthritis, left ankle and foot: Secondary | ICD-10-CM

## 2018-11-12 DIAGNOSIS — M19071 Primary osteoarthritis, right ankle and foot: Secondary | ICD-10-CM | POA: Diagnosis not present

## 2018-11-12 DIAGNOSIS — IMO0001 Reserved for inherently not codable concepts without codable children: Secondary | ICD-10-CM

## 2018-11-12 DIAGNOSIS — I10 Essential (primary) hypertension: Secondary | ICD-10-CM

## 2018-11-12 DIAGNOSIS — Z840 Family history of diseases of the skin and subcutaneous tissue: Secondary | ICD-10-CM

## 2018-11-12 DIAGNOSIS — N182 Chronic kidney disease, stage 2 (mild): Secondary | ICD-10-CM

## 2018-11-12 DIAGNOSIS — M19042 Primary osteoarthritis, left hand: Secondary | ICD-10-CM

## 2018-11-12 DIAGNOSIS — M170 Bilateral primary osteoarthritis of knee: Secondary | ICD-10-CM

## 2018-11-12 DIAGNOSIS — E1165 Type 2 diabetes mellitus with hyperglycemia: Secondary | ICD-10-CM | POA: Diagnosis not present

## 2018-11-12 DIAGNOSIS — I25119 Atherosclerotic heart disease of native coronary artery with unspecified angina pectoris: Secondary | ICD-10-CM

## 2018-11-12 DIAGNOSIS — F5101 Primary insomnia: Secondary | ICD-10-CM

## 2018-11-12 DIAGNOSIS — Z598 Other problems related to housing and economic circumstances: Secondary | ICD-10-CM

## 2018-11-12 NOTE — Chronic Care Management (AMB) (Signed)
  Chronic Care Management    Clinical Social Work Follow Up Note  11/12/2018 Name: Avamarie Crossley MRN: [id number] DOB: [birth year]  Melvena Vink is a 68 y.o. year old female who is a primary care patient of Glendale Chard, MD. The CCM team was consulted for assistance with [name].   Review of patient status, including review of consultants reports, other relevant assessments, and collaboration with appropriate care team members and the patient's provider was performed as part of comprehensive patient evaluation and provision of chronic care management services.     Goals Addressed            This Visit's Progress     Patient Stated   . "I get anxiety about my power bill" (pt-stated)   On track    Current Barriers:  . Financial constraints  . Increased power bill since recent apartment renovation . LIEAP Program assistance has already been used. Patient unaware of other resources available to assist  Clinical Social Work Clinical Goal(s):  Marland Kitchen Over the next 20 days, patient will work with SW to address concerns related to identifying community resources to assist with utility costs not met - re-established 5/13 . Over the next 10 days, patient will follow up with [employer] to arrange an energy assessment as directed by SW completed  CCM SW Interventions: Completed 11/12/18  Telephonic outreach by CCM SW to assess progress of patient stated goal  Confirmed the patient had been in contact with [employer] and was instructed to complete an online application  Determined the patient has begun working on the application but has yet to complete  Assessed for barriers the patient is experiencing with application completion. The patient reports when using the computer too long her eyes begin to hurt. The patient also attributes being focused on other things  Discussed the option to enlist a family member or friend to assist with application completion - the  patient reports she does not feel comfortable allowing anyone in her home for extended periods to assist due to concerns with COVID 19  Encouraged the patient to work on the application in short intervals over the next week in order to complete and submit  Patient Self Care Activities:  . Self administers medications as prescribed . Attends all scheduled provider appointments . Calls pharmacy for medication refills . Attends church or other social activities . Performs ADL's independently  Please see past updates related to this goal by clicking on the "Past Updates" button in the selected goal          Follow Up Plan: SW will follow up with patient by phone over the next 3-4 weeks   Daneen Schick, BSW, CDP [facility] / [facility] Care Management Social Worker [phone]  Total time spent performing care coordination and/or care management activities with the patient by phone or face to face = 15 minutes.

## 2018-11-12 NOTE — Patient Instructions (Signed)
Social Worker Visit Information  Goals we discussed today:  Goals Addressed            This Visit's Progress     Patient Stated   . "I get anxiety about my power bill" (pt-stated)   On track    Current Barriers:  . Financial constraints  . Increased power bill since recent apartment renovation . [employer]IE[employer] Program assistance has already been used. Patient unaware of other resources available to assist  Clinical Social Work Clinical Goal(s):  Marland Kitchen Over the next 20 days, patient will work with SW to address concerns related to identifying community resources to assist with utility costs not met - re-established 5/13 . Over the next 10 days, patient will follow up with [employer] to arrange an energy assessment as directed by SW completed  CCM SW Interventions: Completed 11/12/18  Telephonic outreach by CCM SW to assess progress of patient stated goal  Confirmed the patient had been in contact with [employer] and was instructed to complete an online application  Determined the patient has begun working on the application but has yet to complete  Assessed for barriers the patient is experiencing with application completion. The patient reports when using the computer too long her eyes begin to hurt. The patient also attributes being focused on other things  Discussed the option to enlist a family member or friend to assist with application completion - the patient reports she does not feel comfortable allowing anyone in her home for extended periods to assist due to concerns with COVID 19  Encouraged the patient to work on the application in short intervals over the next week in order to complete and submit  Patient Self Care Activities:  . Self administers medications as prescribed . Attends all scheduled provider appointments . Calls pharmacy for medication refills . Attends church or other social activities . Performs ADL's independently  Please see past updates  related to this goal by clicking on the "Past Updates" button in the selected goal          Materials Provided: No: Patient declined  Follow Up Plan: SW will follow up with patient by phone over the next 3-4 weeks  Daneen Schick, BSW, CDP [facility] [place] [facility] Social Worker [phone]

## 2018-11-12 NOTE — Patient Instructions (Signed)
Knee Exercises Ask your health care provider which exercises are safe for you. Do exercises exactly as told by your health care provider and adjust them as directed. It is normal to feel mild stretching, pulling, tightness, or discomfort as you do these exercises, but you should stop right away if you feel sudden pain or your pain gets worse.Do not begin these exercises until told by your health care provider. STRETCHING AND RANGE OF MOTION EXERCISES These exercises warm up your muscles and joints and improve the movement and flexibility of your knee. These exercises also help to relieve pain, numbness, and tingling. Exercise A: Knee Extension, Prone 1. Lie on your abdomen on a bed. 2. Place your left / right knee just beyond the edge of the surface so your knee is not on the bed. You can put a towel under your left / right thigh just above your knee for comfort. 3. Relax your leg muscles and allow gravity to straighten your knee. You should feel a stretch behind your left / right knee. 4. Hold this position for __________ seconds. 5. Scoot up so your knee is supported between repetitions. Repeat __________ times. Complete this stretch __________ times a day. Exercise B: Knee Flexion, Active  1. Lie on your back with both knees straight. If this causes back discomfort, bend your left / right knee so your foot is flat on the floor. 2. Slowly slide your left / right heel back toward your buttocks until you feel a gentle stretch in the front of your knee or thigh. 3. Hold this position for __________ seconds. 4. Slowly slide your left / right heel back to the starting position. Repeat __________ times. Complete this exercise __________ times a day. Exercise C: Quadriceps, Prone  1. Lie on your abdomen on a firm surface, such as a bed or padded floor. 2. Bend your left / right knee and hold your ankle. If you cannot reach your ankle or pant leg, loop a belt around your foot and grab the belt  instead. 3. Gently pull your heel toward your buttocks. Your knee should not slide out to the side. You should feel a stretch in the front of your thigh and knee. 4. Hold this position for __________ seconds. Repeat __________ times. Complete this stretch __________ times a day. Exercise D: Hamstring, Supine 1. Lie on your back. 2. Loop a belt or towel over the ball of your left / right foot. The ball of your foot is on the walking surface, right under your toes. 3. Straighten your left / right knee and slowly pull on the belt to raise your leg until you feel a gentle stretch behind your knee. ? Do not let your left / right knee bend while you do this. ? Keep your other leg flat on the floor. 4. Hold this position for __________ seconds. Repeat __________ times. Complete this stretch __________ times a day. STRENGTHENING EXERCISES These exercises build strength and endurance in your knee. Endurance is the ability to use your muscles for a long time, even after they get tired. Exercise E: Quadriceps, Isometric  1. Lie on your back with your left / right leg extended and your other knee bent. Put a rolled towel or small pillow under your knee if told by your health care provider. 2. Slowly tense the muscles in the front of your left / right thigh. You should see your kneecap slide up toward your hip or see increased dimpling just above the knee. This   motion will push the back of the knee toward the floor. 3. For __________ seconds, keep the muscle as tight as you can without increasing your pain. 4. Relax the muscles slowly and completely. Repeat __________ times. Complete this exercise __________ times a day. Exercise F: Straight Leg Raises - Quadriceps 1. Lie on your back with your left / right leg extended and your other knee bent. 2. Tense the muscles in the front of your left / right thigh. You should see your kneecap slide up or see increased dimpling just above the knee. Your thigh may  even shake a bit. 3. Keep these muscles tight as you raise your leg 4-6 inches (10-15 cm) off the floor. Do not let your knee bend. 4. Hold this position for __________ seconds. 5. Keep these muscles tense as you lower your leg. 6. Relax your muscles slowly and completely after each repetition. Repeat __________ times. Complete this exercise __________ times a day. Exercise G: Hamstring, Isometric 1. Lie on your back on a firm surface. 2. Bend your left / right knee approximately __________ degrees. 3. Dig your left / right heel into the surface as if you are trying to pull it toward your buttocks. Tighten the muscles in the back of your thighs to dig as hard as you can without increasing any pain. 4. Hold this position for __________ seconds. 5. Release the tension gradually and allow your muscles to relax completely for __________ seconds after each repetition. Repeat __________ times. Complete this exercise __________ times a day. Exercise H: Hamstring Curls  If told by your health care provider, do this exercise while wearing ankle weights. Begin with __________ weights. Then increase the weight by 1 lb (0.5 kg) increments. Do not wear ankle weights that are more than __________. 1. Lie on your abdomen with your legs straight. 2. Bend your left / right knee as far as you can without feeling pain. Keep your hips flat against the floor. 3. Hold this position for __________ seconds. 4. Slowly lower your leg to the starting position.  Repeat __________ times. Complete this exercise __________ times a day. Exercise I: Squats (Quadriceps) 1. Stand in front of a table, with your feet and knees pointing straight ahead. You may rest your hands on the table for balance but not for support. 2. Slowly bend your knees and lower your hips like you are going to sit in a chair. ? Keep your weight over your heels, not over your toes. ? Keep your lower legs upright so they are parallel with the table  legs. ? Do not let your hips go lower than your knees. ? Do not bend lower than told by your health care provider. ? If your knee pain increases, do not bend as low. 3. Hold the squat position for __________ seconds. 4. Slowly push with your legs to return to standing. Do not use your hands to pull yourself to standing. Repeat __________ times. Complete this exercise __________ times a day. Exercise J: Wall Slides (Quadriceps)  1. Lean your back against a smooth wall or door while you walk your feet out 18-24 inches (46-61 cm) from it. 2. Place your feet hip-width apart. 3. Slowly slide down the wall or door until your knees bend __________ degrees. Keep your knees over your heels, not over your toes. Keep your knees in line with your hips. 4. Hold for __________ seconds. Repeat __________ times. Complete this exercise __________ times a day. Exercise K: Straight Leg Raises -   Hip Abductors 1. Lie on your side with your left / right leg in the top position. Lie so your head, shoulder, knee, and hip line up. You may bend your bottom knee to help you keep your balance. 2. Roll your hips slightly forward so your hips are stacked directly over each other and your left / right knee is facing forward. 3. Leading with your heel, lift your top leg 4-6 inches (10-15 cm). You should feel the muscles in your outer hip lifting. ? Do not let your foot drift forward. ? Do not let your knee roll toward the ceiling. 4. Hold this position for __________ seconds. 5. Slowly return your leg to the starting position. 6. Let your muscles relax completely after each repetition. Repeat __________ times. Complete this exercise __________ times a day. Exercise L: Straight Leg Raises - Hip Extensors 1. Lie on your abdomen on a firm surface. You can put a pillow under your hips if that is more comfortable. 2. Tense the muscles in your buttocks and lift your left / right leg about 4-6 inches (10-15 cm). Keep your knee  straight as you lift your leg. 3. Hold this position for __________ seconds. 4. Slowly lower your leg to the starting position. 5. Let your leg relax completely after each repetition. Repeat __________ times. Complete this exercise __________ times a day. This information is not intended to replace advice given to you by your health care provider. Make sure you discuss any questions you have with your health care provider. Document Released: 04/23/2005 Document Revised: 03/03/2016 Document Reviewed: 04/15/2015 Elsevier Interactive Patient Education  2018 [employer] Hand Exercises Hand exercises can be helpful to almost anyone. These exercises can strengthen the hands, improve flexibility and movement, and increase blood flow to the hands. These results can make work and daily tasks easier. Hand exercises can be especially helpful for people who have joint pain from arthritis or have nerve damage from overuse (carpal tunnel syndrome). These exercises can also help people who have injured a hand. Most of these hand exercises are fairly gentle stretching routines. You can do them often throughout the day. Still, it is a good idea to ask your health care provider which exercises would be best for you. Warming your hands before exercise may help to reduce stiffness. You can do this with gentle massage or by placing your hands in warm water for 15 minutes. Also, make sure you pay attention to your level of hand pain as you begin an exercise routine. Exercises Knuckle bend Repeat this exercise 5-10 times with each hand. 1. Stand or sit with your arm, hand, and all five fingers pointed straight up. Make sure your wrist is straight. 2. Gently and slowly bend your fingers down and inward until the tips of your fingers are touching the tops of your palm. 3. Hold this position for a few seconds. 4. Extend your fingers out to their original position, all pointing straight up again. Finger fan Repeat this  exercise 5-10 times with each hand. 1. Hold your arm and hand out in front of you. Keep your wrist straight. 2. Squeeze your hand into a fist. 3. Hold this position for a few seconds. 4. Fan out, or spread apart, your hand and fingers as much as possible, stretching every joint fully. Tabletop Repeat this exercise 5-10 times with each hand. 1. Stand or sit with your arm, hand, and all five fingers pointed straight up. Make sure your wrist is straight. 2. Gently   and slowly bend your fingers at the knuckles where they meet the hand until your hand is making an upside-down L shape. Your fingers should form a tabletop. 3. Hold this position for a few seconds. 4. Extend your fingers out to their original position, all pointing straight up again. Making Os Repeat this exercise 5-10 times with each hand. 1. Stand or sit with your arm, hand, and all five fingers pointed straight up. Make sure your wrist is straight. 2. Make an O shape by touching your pointer finger to your thumb. Hold for a few seconds. Then open your hand wide. 3. Repeat this motion with each finger on your hand. Table spread Repeat this exercise 5-10 times with each hand. 1. Place your hand on a table with your palm facing down. Make sure your wrist is straight. 2. Spread your fingers out as much as possible. Hold this position for a few seconds. 3. Slide your fingers back together again. Hold for a few seconds. Ball grip Repeat this exercise 10-15 times with each hand. 1. Hold a tennis ball or another soft ball in your hand. 2. While slowly increasing pressure, squeeze the ball as hard as possible. 3. Squeeze as hard as you can for 3-5 seconds. 4. Relax and repeat.  Wrist curls Repeat this exercise 10-15 times with each hand. 1. Sit in a chair that has armrests. 2. Hold a light weight in your hand, such as a dumbbell that weighs 1-3 pounds (0.5-1.4 kg). Ask your health care provider what weight would be best for you. 3.  Rest your hand just over the end of the chair arm with your palm facing up. 4. Gently pivot your wrist up and down while holding the weight. Do not twist your wrist from side to side. Contact a health care provider if:  Your hand pain or discomfort gets much worse when you do an exercise.  Your hand pain or discomfort does not improve within 2 hours after you exercise. If you have any of these problems, stop doing these exercises right away. Do not do them again unless your health care provider says that you can. Get help right away if:  You develop sudden, severe hand pain. If this happens, stop doing these exercises right away. Do not do them again unless your health care provider says that you can. This information is not intended to replace advice given to you by your health care provider. Make sure you discuss any questions you have with your health care provider. Document Released: 05/21/2015 Document Revised: 10/13/2017 Document Reviewed: 12/18/2014 Elsevier Interactive Patient Education  2019 [employer]  

## 2018-11-16 ENCOUNTER — Other Ambulatory Visit: Payer: Self-pay | Admitting: Internal Medicine

## 2018-11-16 ENCOUNTER — Telehealth: Payer: Self-pay

## 2018-11-16 NOTE — Telephone Encounter (Signed)
REFILL TRAZODONE

## 2018-11-17 ENCOUNTER — Ambulatory Visit: Payer: Self-pay

## 2018-11-17 ENCOUNTER — Ambulatory Visit: Payer: Medicare Other | Admitting: Cardiology

## 2018-11-17 ENCOUNTER — Telehealth: Payer: Self-pay

## 2018-11-17 ENCOUNTER — Other Ambulatory Visit: Payer: Self-pay

## 2018-11-17 DIAGNOSIS — Z794 Long term (current) use of insulin: Secondary | ICD-10-CM | POA: Diagnosis not present

## 2018-11-17 DIAGNOSIS — E1122 Type 2 diabetes mellitus with diabetic chronic kidney disease: Secondary | ICD-10-CM

## 2018-11-17 DIAGNOSIS — M2550 Pain in unspecified joint: Secondary | ICD-10-CM

## 2018-11-17 DIAGNOSIS — N182 Chronic kidney disease, stage 2 (mild): Secondary | ICD-10-CM

## 2018-11-17 DIAGNOSIS — I10 Essential (primary) hypertension: Secondary | ICD-10-CM | POA: Diagnosis not present

## 2018-11-17 DIAGNOSIS — M791 Myalgia, unspecified site: Secondary | ICD-10-CM

## 2018-11-18 ENCOUNTER — Ambulatory Visit: Payer: Medicare Other | Admitting: Cardiology

## 2018-11-18 ENCOUNTER — Telehealth: Payer: Self-pay

## 2018-11-18 NOTE — Patient Instructions (Signed)
Visit Information  Goals Addressed      Patient Stated   . "I don't want to be Diabetic anymore" (pt-stated)   On track    Current Barriers:  Marland Kitchen Knowledge Deficits related to disease process and Self Health Management of Diabetes  Nurse Case Manager Clinical Goal(s):  Marland Kitchen Over the next 90 days, patient will work with RN CM to address needs related to weight loss plan and Diabetes 11/17/18: Target goal date revised to 90 days secondary to COVID-19 treatment delays  CCM RN CM Interventions: Completed on 11/17/18: completed call with patient   Assessed for ongoing adherence to following her prescribed txt plan for diabetes including, following ADA diet, implementing 150 mins of exercise weekly and taking diabetic medications exactly as prescribed  Assessed for self monitoring of CBG's (pt reports average am CBG is running between 80's-90's -- pt is self monitoring her CBG's twice daily  Positive reinforcement provided for patient adherence with her Diabetes treatment regimen  Reinforced utilizing portion control and using the plate method   Discussed patient's A1C and target A1C to be < 7.0  Discussed upcoming PCP follow with Dr. Baird Cancer date/time; recommended fasting for labs  Confirmed patient has the contact # for RN CM and discussed hours of nurse availability  Scheduled a CCM RN CM Telephone follow up for 2-3 weeks  Patient Self Care Activities:   Verbalizes understanding of the education/information provided  . Self administers medications as prescribed . Attends all scheduled provider appointments . Calls pharmacy for medication refills . Attends church or other social activities . Performs ADL's independently . Performs IADL's independently . Calls provider office for new concerns or questions  Please see past updates related to this goal by clicking on the "Past Updates" button in the selected goal     . COMPLETED: "I would like to know more about DMARDs" (pt-stated)        Current Barriers:  Marland Kitchen Knowledge Deficits related to disease process and pharmacological treatment for Rheumatoid Arthritis  Nurse Case Manager Clinical Goal(s):  Marland Kitchen Over the next 30 days, patient will attend all scheduled medical appointments: with Rheumatologist, Dr. Estanislado Pandy on 11/12/18. 11/17/18: Goal Met . Over the next 30 days, patient will verbalize increased knowledge and understanding of the RA disease process and treatment recommendations to best manage this disease. 11/17/18: Goal Met   CCM RN CM Interventions: Completed on 11/17/18: completed call with patient    Discussed and confirmed patient's MD f/u with Rheumatology was completed  Discussed and reviewed MD recommendations for ongoing follow up with Rheumatology in 1 year for Osteoarthritis  Provided verbal education to patient regarding basic disease process and treatment management of Osteoarthritis  Mailed printed materials "Everything You need to Know about Osteoarthritis" to patient for review  Discussed patient's home exercise plan and reinforced patient starting off with 10-15 min. Increments to help avoid exertion, and overworking of joints - pt verbalizes understanding   Patient Self Care Activities:  . Self administers medications as prescribed . Attends all scheduled provider appointments . Calls pharmacy for medication refills . Performs ADL's independently . Performs IADL's independently . Calls provider office for new concerns or questions  Please see past updates related to this goal by clicking on the "Past Updates" button in the selected goal     . I would like to reduce my A1c  (pt-stated)   On track    Current Barriers:  . Non Adherence to DM health diet/lifestyle (current A1c 9.4% on  08/19/18)  Pharmacist Clinical Goal(s):  Marland Kitchen Over the next 60 days, patient will demonstrate Improved medication adherence as evidenced by daily medication adherence, FBG <130 and controlled BGs throughout the day:  11/17/18: Target Goal Date revised to 60 days due to treatment delays secondary to COVID-19  CCM RN CM Interventions: Completed on 11/17/18: completed call with patient  . Comprehensive medication review performed. . Discussed plans with patient for ongoing care management follow up and provided patient with direct contact information for care management team  . Reinforced importance of continuing to take medications as prescribed . Encouraged patient to contact Dr. Baird Cancer and or a CCM team member for questions/concerns related to her medication regimen  Patient Self Care Activities:  . Self administers medications as prescribed . Attends all scheduled provider appointments . Calls pharmacy for medication refills  Please see past updates related to this goal by clicking on the "Past Updates" button in the selected goal      . I would like to review my medications for drug interactions (pt-stated)   On track    Current Barriers:  Marland Kitchen Knowledge Deficits related to pharmacotherapy  Pharmacist Clinical Goal(s):  Marland Kitchen Over the next 60 days, polypharmacy will be reduced as evidenced by decreasing the number of vitamins/supplements patient is taking 11/17/18: Target Goal Date revised to 60 days due to treatment delays secondary to COVID-19  CCM RN CM Interventions: Completed on 11/17/18: completed call with patient   Comprehensive medication review performed  Assessed for questions/concerns related to prescribed medication regimen - pt denies at this time  Assessed for good effectiveness from medications w/o SE - pt denies at this time  Discussed plans with patient for ongoing care management follow up and provided patient with direct contact information for care management team   Patient Self Care Activities:  . Attends all scheduled provider appointments . Calls pharmacy for medication refills . Calls provider office for new concerns or questions  Please see past updates related to  this goal by clicking on the "Past Updates" button in the selected goal         The patient verbalized understanding of instructions provided today and declined a print copy of patient instruction materials.    Telephone follow up appointment with CCM team member scheduled for: 2-3 weeks   Barb Merino, [facility] Care Management Coordinator [place] [facility]  Direct Phone: [phone]

## 2018-11-18 NOTE — Chronic Care Management (AMB) (Signed)
Chronic Care Management   Follow Up Note   11/17/2018 Name: [name] [name] MRN: [id number] DOB: 1951-05-05  Referred by: Glendale Chard, MD Reason for referral : Chronic Care Management (CCM RN CM Telephone Follow Up )   [name] [name] is a 68 y.o. year old female who is a primary care patient of Glendale Chard, MD. The CCM team was consulted for assistance with chronic disease management and care coordination needs.    Review of patient status, including review of consultants reports, relevant laboratory and other test results, and collaboration with appropriate care team members and the patient's provider was performed as part of comprehensive patient evaluation and provision of chronic care management services.    I spoke with [name] by telephone today for a CCM update.   Goals Addressed      Patient Stated   . "I don't want to be Diabetic anymore" (pt-stated)   On track    Current Barriers:  Marland Kitchen Knowledge Deficits related to disease process and Self Health Management of Diabetes  Nurse Case Manager Clinical Goal(s):  Marland Kitchen Over the next 90 days, patient will work with RN CM to address needs related to weight loss plan and Diabetes 11/17/18: Target goal date revised to 90 days secondary to COVID-19 treatment delays  CCM RN CM Interventions: Completed on 11/17/18: completed call with patient   Assessed for ongoing adherence to following her prescribed txt plan for diabetes including, following ADA diet, implementing 150 mins of exercise weekly and taking diabetic medications exactly as prescribed  Assessed for self monitoring of CBG's (pt reports average am CBG is running between 80's-90's -- pt is self monitoring her CBG's twice daily  Positive reinforcement provided for patient adherence with her Diabetes treatment regimen  Reinforced utilizing portion control and using the plate method   Discussed patient's A1C and target A1C to be < 7.0  Discussed upcoming PCP follow  with Dr. Baird Cancer date/time; recommended fasting for labs  Confirmed patient has the contact # for RN CM and discussed hours of nurse availability  Scheduled a CCM RN CM Telephone follow up for 2-3 weeks  Patient Self Care Activities:   Verbalizes understanding of the education/information provided  . Self administers medications as prescribed . Attends all scheduled provider appointments . Calls pharmacy for medication refills . Attends church or other social activities . Performs ADL's independently . Performs IADL's independently . Calls provider office for new concerns or questions  Please see past updates related to this goal by clicking on the "Past Updates" button in the selected goal     . COMPLETED: "I would like to know more about DMARDs" (pt-stated)       Current Barriers:  Marland Kitchen Knowledge Deficits related to disease process and pharmacological treatment for Rheumatoid Arthritis  Nurse Case Manager Clinical Goal(s):  Marland Kitchen Over the next 30 days, patient will attend all scheduled medical appointments: with Rheumatologist, Dr. Estanislado Pandy on 11/12/18. 11/17/18: Goal Met . Over the next 30 days, patient will verbalize increased knowledge and understanding of the RA disease process and treatment recommendations to best manage this disease. 11/17/18: Goal Met   CCM RN CM Interventions: Completed on 11/17/18: completed call with patient    Discussed and confirmed patient's MD f/u with Rheumatology was completed  Discussed and reviewed MD recommendations for ongoing follow up with Rheumatology in 1 year for Osteoarthritis  Provided verbal education to patient regarding basic disease process and treatment management of Osteoarthritis  Mailed printed materials "Everything You need  to Know about Osteoarthritis" to patient for review  Discussed patient's home exercise plan and reinforced patient starting off with 10-15 min. Increments to help avoid exertion, and overworking of joints - pt  verbalizes understanding   Patient Self Care Activities:  . Self administers medications as prescribed . Attends all scheduled provider appointments . Calls pharmacy for medication refills . Performs ADL's independently . Performs IADL's independently . Calls provider office for new concerns or questions  Please see past updates related to this goal by clicking on the "Past Updates" button in the selected goal     . I would like to reduce my A1c  (pt-stated)   On track    Current Barriers:  . Non Adherence to DM health diet/lifestyle (current A1c 9.4% on 08/19/18)  Pharmacist Clinical Goal(s):  Marland Kitchen Over the next 60 days, patient will demonstrate Improved medication adherence as evidenced by daily medication adherence, FBG <130 and controlled BGs throughout the day: 11/17/18: Target Goal Date revised to 60 days due to treatment delays secondary to COVID-19  CCM RN CM Interventions: Completed on 11/17/18: completed call with patient  . Comprehensive medication review performed. . Discussed plans with patient for ongoing care management follow up and provided patient with direct contact information for care management team  . Reinforced importance of continuing to take medications as prescribed . Encouraged patient to contact Dr. Baird Cancer and or a CCM team member for questions/concerns related to her medication regimen  Patient Self Care Activities:  . Self administers medications as prescribed . Attends all scheduled provider appointments . Calls pharmacy for medication refills  Please see past updates related to this goal by clicking on the "Past Updates" button in the selected goal     . I would like to review my medications for drug interactions (pt-stated)   On track    Current Barriers:  Marland Kitchen Knowledge Deficits related to pharmacotherapy  Pharmacist Clinical Goal(s):  Marland Kitchen Over the next 60 days, polypharmacy will be reduced as evidenced by decreasing the number of vitamins/supplements  patient is taking 11/17/18: Target Goal Date revised to 60 days due to treatment delays secondary to COVID-19  CCM RN CM Interventions: Completed on 11/17/18: completed call with patient   Comprehensive medication review performed  Assessed for questions/concerns related to prescribed medication regimen - pt denies at this time  Assessed for good effectiveness from medications w/o SE - pt denies at this time  Discussed plans with patient for ongoing care management follow up and provided patient with direct contact information for care management team  Patient Self Care Activities:  . Attends all scheduled provider appointments . Calls pharmacy for medication refills . Calls provider office for new concerns or questions  Please see past updates related to this goal by clicking on the "Past Updates" button in the selected goal         Telephone follow up appointment with CCM team member scheduled for: 2-3 weeks  Barb Merino, [facility] Care Management Coordinator [facility]  Direct Phone: [phone]

## 2018-11-19 ENCOUNTER — Ambulatory Visit: Payer: Medicare Other

## 2018-11-19 ENCOUNTER — Telehealth: Payer: Self-pay

## 2018-11-22 ENCOUNTER — Ambulatory Visit
Admission: RE | Admit: 2018-11-22 | Discharge: 2018-11-22 | Disposition: A | Discharge status: Home / Self Care | Payer: Medicare Other | Source: Ambulatory Visit | Attending: Internal Medicine | Admitting: Internal Medicine

## 2018-11-22 ENCOUNTER — Other Ambulatory Visit: Payer: Self-pay

## 2018-11-22 DIAGNOSIS — Z1231 Encounter for screening mammogram for malignant neoplasm of breast: Secondary | ICD-10-CM | POA: Diagnosis not present

## 2018-11-23 ENCOUNTER — Encounter: Payer: Self-pay | Admitting: Internal Medicine

## 2018-11-23 ENCOUNTER — Ambulatory Visit (INDEPENDENT_AMBULATORY_CARE_PROVIDER_SITE_OTHER): Payer: Medicare Other | Admitting: Internal Medicine

## 2018-11-23 ENCOUNTER — Telehealth: Payer: Self-pay

## 2018-11-23 VITALS — BP 126/78 | HR 62 | Temp 98.0°F | Ht 64.5 in | Wt 197.8 lb

## 2018-11-23 DIAGNOSIS — N182 Chronic kidney disease, stage 2 (mild): Secondary | ICD-10-CM

## 2018-11-23 DIAGNOSIS — E1122 Type 2 diabetes mellitus with diabetic chronic kidney disease: Secondary | ICD-10-CM

## 2018-11-23 DIAGNOSIS — Z6833 Body mass index (BMI) 33.0-33.9, adult: Secondary | ICD-10-CM

## 2018-11-23 DIAGNOSIS — I25119 Atherosclerotic heart disease of native coronary artery with unspecified angina pectoris: Secondary | ICD-10-CM | POA: Diagnosis not present

## 2018-11-23 DIAGNOSIS — I1310 Hypertensive heart and chronic kidney disease without heart failure, with stage 1 through stage 4 chronic kidney disease, or unspecified chronic kidney disease: Secondary | ICD-10-CM

## 2018-11-23 DIAGNOSIS — R600 Localized edema: Secondary | ICD-10-CM | POA: Diagnosis not present

## 2018-11-23 DIAGNOSIS — Z794 Long term (current) use of insulin: Secondary | ICD-10-CM

## 2018-11-23 DIAGNOSIS — E6609 Other obesity due to excess calories: Secondary | ICD-10-CM

## 2018-11-23 MED ORDER — DULAGLUTIDE 0.75 MG/0.5ML ~~LOC~~ SOAJ: 0.7500 mg | SUBCUTANEOUS | 1 refills | Status: DC

## 2018-11-24 ENCOUNTER — Telehealth: Payer: Self-pay

## 2018-11-24 LAB — BMP8+EGFR
BUN/Creatinine Ratio: 13 (ref 12–28)
BUN: 11 mg/dL (ref 8–27)
CO2: 27 mmol/L (ref 20–29)
Calcium: 9.2 mg/dL (ref 8.7–10.3)
Chloride: 102 mmol/L (ref 96–106)
Creatinine, Ser: 0.88 mg/dL (ref 0.57–1.00)
GFR calc Af Amer: 78 mL/min/{1.73_m2} (ref >59)
GFR calc non Af Amer: 68 mL/min/{1.73_m2} (ref >59)
Glucose: 305 mg/dL — ABNORMAL HIGH (ref 65–99)
Potassium: 4.6 mmol/L (ref 3.5–5.2)
Sodium: 143 mmol/L (ref 134–144)

## 2018-11-24 LAB — HEMOGLOBIN A1C
Est. average glucose Bld gHb Est-mCnc: 214 mg/dL
Hgb A1c MFr Bld: 9.1 % — ABNORMAL HIGH (ref 4.8–5.6)

## 2018-11-24 NOTE — Telephone Encounter (Signed)
Pt left voicemail stating that she has had low blood sugars. Dr Allyne Gee advised pt to decrease insulin to 35 units at night

## 2018-11-25 ENCOUNTER — Telehealth: Payer: Self-pay

## 2018-11-26 ENCOUNTER — Telehealth: Payer: Self-pay

## 2018-11-26 NOTE — Telephone Encounter (Signed)
Left the patient a message to call back for lab results. 

## 2018-11-26 NOTE — Telephone Encounter (Signed)
-----   Message from [name], MD sent at 11/24/2018  8:24 AM EDT ----- Your kidney fxn is stable. Your hba1c is 9.1, it has improved slightly. Goal is less than 7.8. Please increase exercise as we discussed. I do not want to only prescribe more medication to help you reach your goal. I want [place] to work together to achieve optimal glycemic control. Please walk 30 minutes five days a week - start with 15 minutes and each week add 5 minutes until you reach 30 minutes. Also, avoid sodas and juices - deli meats and other processed foods. I know you can do this!

## 2018-11-28 NOTE — Progress Notes (Signed)
Subjective:     Patient ID: [name] [name] , female    DOB: [birth year] , 68 y.o.   MRN: [id number]   Chief Complaint  Patient presents with  . Diabetes  . Hypertension    HPI  Diabetes  She presents for her follow-up diabetic visit. She has type 2 diabetes mellitus. Her disease course has been improving. There are no hypoglycemic associated symptoms. Pertinent negatives for diabetes include no blurred vision and no chest pain. There are no hypoglycemic complications. Risk factors for coronary artery disease include diabetes mellitus, dyslipidemia, hypertension and post-menopausal. Current diabetic treatment includes insulin injections. She is compliant with treatment most of the time. She is following a diabetic diet. She participates in exercise intermittently. Eye exam is current.  Hypertension  This is a chronic problem. The current episode started more than 1 year ago. The problem has been gradually improving since onset. The problem is controlled. Pertinent negatives include no blurred vision, chest pain or palpitations. The current treatment provides moderate improvement. Compliance problems include exercise and diet.  Hypertensive end-organ damage includes kidney disease. Identifiable causes of hypertension include chronic renal disease.     Past Medical History:  Diagnosis Date  . Anemia   . Carpal tunnel syndrome   . Coronary artery disease    DES to ostial RCA 2005  . Diabetes mellitus without complication ([place])    No longer taking meds  . Dyslipidemia   . Hypertension   . Osteoarthritis   . PAD (peripheral artery disease) (HCC)      Family History  Problem Relation Age of Onset  . CAD Father 62  . CAD Mother 19  . CAD Brother   . Cancer Brother   . CAD Sister   . CAD Brother      Current Outpatient Medications:  .  amLODipine (NORVASC) 10 MG tablet, TAKE 1 TABLET(10 MG) BY MOUTH DAILY, Disp: 90 tablet, Rfl: 1 .  aspirin 81 MG tablet, Take 81 mg by mouth  daily., Disp: , Rfl:  .  BD PEN NEEDLE NANO U/F 32G X 4 MM MISC, USE TO INJECT INSULIN DAILY, Disp: 100 each, Rfl: 1 .  beta carotene 25000 UNIT capsule, Take 25,000 Units by mouth daily., Disp: , Rfl:  .  BIOTIN PO, Take 500 mcg by mouth daily. , Disp: , Rfl:  .  Cholecalciferol ([employer] VIT D 5000 HIGH-POTENCY PO), Take 1 tablet by mouth daily. , Disp: , Rfl:  .  CHROMIUM PICOLINATE PO, Take 200 mcg by mouth daily. , Disp: , Rfl:  .  Coenzyme Q10 (COQ-10) 100 MG CAPS, Take 1 tablet by mouth daily. , Disp: , Rfl:  .  Cyanocobalamin (VITAMIN B-12 PO), Take 500 mcg by mouth. , Disp: , Rfl:  .  Flaxseed, Linseed, (FLAXSEED OIL) 1000 MG CAPS, Take 1 capsule by mouth daily. , Disp: , Rfl:  .  glucosamine-chondroitin 500-400 MG tablet, Take 1 tablet by mouth daily. Reported on 12/17/2015, Disp: , Rfl:  .  glucose blood (ONETOUCH VERIO) test strip, CHECK BLOOD SUGAR THREE TIMES DAILY dx: e11.65, Disp: 150 each, Rfl: 11 .  Krill Oil 350 MG CAPS, Take 350 mg by mouth daily. , Disp: , Rfl:  .  magnesium oxide (MAG-OX) 400 MG tablet, Take 400 mg by mouth daily., Disp: , Rfl:  .  metoprolol tartrate (LOPRESSOR) 50 MG tablet, TAKE 1 TABLET BY MOUTH TWICE DAILY, Disp: 180 tablet, Rfl: 4 .  ONETOUCH DELICA LANCETS 86V MISC, USE AS DIRECTED TO  CHECK BLOOD SUGAR THREE TIMES DAILY, Disp: 100 each, Rfl: 0 .  pregabalin (LYRICA) 50 MG capsule, TAKE 1 CAPSULE(50 MG) BY MOUTH TWICE DAILY, Disp: 60 capsule, Rfl: 2 .  rosuvastatin (CRESTOR) 20 MG tablet, Take 1 tablet (20 mg total) by mouth daily., Disp: 90 tablet, Rfl: 3 .  traZODone (DESYREL) 50 MG tablet, TAKE 1 TABLET BY MOUTH EVERY NIGHT AT BEDTIME, Disp: 30 tablet, Rfl: 1 .  TRESIBA FLEXTOUCH 200 UNIT/ML SOPN, INJECT 34 UNITS UNDER THE SKIN EVERY NIGHT AT BEDTIME, MAX DOSE IS 80 UNITS (Patient taking differently: 48 Units. ), Disp: 9 mL, Rfl: 2 .  vitamin C (ASCORBIC ACID) 500 MG tablet, Take 1,000 mg by mouth 2 (two) times daily. , Disp: , Rfl:  .  vitamin E 200 UNIT  capsule, Take 200 Units by mouth daily., Disp: , Rfl:  .  Dulaglutide (TRULICITY) 5.27 PO/2.4MP SOPN, Inject 0.75 mg into the skin once a week., Disp: 4 pen, Rfl: 1   Allergies  Allergen Reactions  . Latex Rash     Review of Systems  Constitutional: Negative.   Eyes: Negative for blurred vision.  Respiratory: Negative.   Cardiovascular: Negative.  Negative for chest pain and palpitations.  Gastrointestinal: Negative.   Neurological: Negative.   Psychiatric/Behavioral: Negative.      Today's Vitals   11/23/18 0939  BP: 126/78  Pulse: 62  Temp: 98 F (36.7 C)  TempSrc: Oral  Weight: 197 lb 12.8 oz (89.7 kg)  Height: 5' 4.5" (1.638 m)  PainSc: 5   PainLoc: Ankle   Body mass index is 33.43 kg/m.   Objective:  Physical Exam Vitals signs and nursing note reviewed.  Constitutional:      Appearance: Normal appearance.  HENT:     Head: Normocephalic and atraumatic.  Cardiovascular:     Rate and Rhythm: Normal rate and regular rhythm.     Heart sounds: Normal heart sounds.  Pulmonary:     Effort: Pulmonary effort is normal.     Breath sounds: Normal breath sounds.  Musculoskeletal:     Right lower leg: 1+ Pitting Edema present.     Left lower leg: 1+ Pitting Edema present.  Skin:    General: Skin is warm.  Neurological:     General: No focal deficit present.     Mental Status: She is alert.  Psychiatric:        Mood and Affect: Mood normal.        Behavior: Behavior normal.         Assessment And Plan:     1. Type 2 diabetes mellitus with stage 2 chronic kidney disease, with long-term current use of insulin ([place])  I will check labs as listed below. Importance of regular exercise was discussed with the patient.   - BMP8+EGFR - Hemoglobin A1c  2. Hypertensive heart and renal disease with renal failure, stage 1 through stage 4 or unspecified chronic kidney disease, without heart failure  Well controlled. She will continue with current meds. She is encouraged  to avoid adding salt to her foods.   3. Atherosclerosis of native coronary artery of native heart with angina pectoris (HCC)  Chronic, yet stable. Importance of dietary, exercise and medication compliance was discussed with the patient.   4. Lower extremity edema  Chronic. She is encouraged to elevate her feet while seated. She is encouraged to avoid cooking with salt, and to avoid adding salt to her foods.   5. Class 1 obesity due to excess  calories with serious comorbidity and body mass index (BMI) of 33.0 to 33.9 in adult  Importance of achieving optimal weight to decrease risk of cardiovascular disease and cancers was discussed with the patient in full detail. She is encouraged to start slowly - start with 10 minutes twice daily at least three to four days per week and to gradually build to 30 minutes five days weekly. She was given tips to incorporate more activity into her daily routine - take stairs when possible, park farther away from grocery stores, etc.    [name], MD    THE PATIENT IS ENCOURAGED TO PRACTICE SOCIAL DISTANCING DUE TO THE COVID-19 PANDEMIC.

## 2018-11-29 ENCOUNTER — Other Ambulatory Visit: Payer: Self-pay | Admitting: Internal Medicine

## 2018-11-29 ENCOUNTER — Telehealth: Payer: Self-pay

## 2018-11-29 NOTE — Telephone Encounter (Signed)
2nd attempt to give labs   

## 2018-11-30 NOTE — Telephone Encounter (Signed)
Pregabalin refill

## 2018-12-07 ENCOUNTER — Ambulatory Visit: Payer: Self-pay

## 2018-12-07 DIAGNOSIS — Z599 Problem related to housing and economic circumstances, unspecified: Secondary | ICD-10-CM

## 2018-12-07 DIAGNOSIS — Z598 Other problems related to housing and economic circumstances: Secondary | ICD-10-CM

## 2018-12-07 NOTE — Chronic Care Management (AMB) (Signed)
  Chronic Care Management    Social Work Follow Up Note  12/07/2018 Name: [name] [name] MRN: [id number] DOB: 01/31/1951  [name] [name] is a 68 y.o. year old female who is a primary care patient of Glendale Chard, MD. The CCM team was consulted for assistance with [name].   Review of patient status, including review of consultants reports, other relevant assessments, and collaboration with appropriate care team members and the patient's provider was performed as part of comprehensive patient evaluation and provision of chronic care management services.     Goals Addressed            This Visit's Progress     Patient Stated   . "I get anxiety about my power bill" (pt-stated)   On track    Current Barriers:  . Financial constraints  . Increased power bill since recent apartment renovation . [employer] Program assistance has already been used. Patient unaware of other resources available to assist  Clinical Social Work Clinical Goal(s):  Marland Kitchen Over the next 20 days, patient will work with SW to address concerns related to identifying community resources to assist with utility costs not met - re-established 5/13 completed 12/07/18 . Over the next 10 days, patient will follow up with [employer] to arrange an energy assessment as directed by SW completed . 12/07/18: Over the next 30 days patient will work with [employer] to determine eligibility for receiving financial assistance  CCM SW Interventions: Completed 12/07/18  Telephonic outreach by CCM SW to assess progress of patient stated goal  Confirmed the patient has completed the application to receive financial assistance from [employer] and submitted the application to the agency  Determined the patient is currently awaiting confirmation whether or not she qualifies for the program  Advised the patient to contact CCM SW once hearing from [facility] with determination  Informed by the patient  she is continuing to make payments on her utility bill as she is able. The patient also reports she keeps her thermostat set to 76 degrees in hopes of lessening her monthly costs  Assessed for continued anxiety over amount owed; the patient reports "I know whether they help me or not I will be okay because God will provide"  Encouraged the patient to continue leaning on her faith to alleviate stress and anxiety towards current challenges  Patient Self Care Activities:  . Self administers medications as prescribed . Attends all scheduled provider appointments . Calls pharmacy for medication refills . Attends church or other social activities . Performs ADL's independently  Please see past updates related to this goal by clicking on the "Past Updates" button in the selected goal         Follow Up Plan: SW will follow up with patient by phone over the next 3-4 weeks   Daneen Schick, BSW, CDP Social Worker, Certified Dementia Practitioner [facility] [phone]  Total time spent performing care coordination and/or care management activities with the patient by phone or face to face = 18 minutes.

## 2018-12-07 NOTE — Patient Instructions (Signed)
Social Worker Visit Information  Goals we discussed today:  Goals Addressed            This Visit's Progress     Patient Stated   . "I get anxiety about my power bill" (pt-stated)   On track    Current Barriers:  . Financial constraints  . Increased power bill since recent apartment renovation . [employer] Program assistance has already been used. Patient unaware of other resources available to assist  Clinical Social Work Clinical Goal(s):  . Over the next 20 days, patient will work with SW to address concerns related to identifying community resources to assist with utility costs not met - re-established 5/13 completed 12/07/18 . Over the next 10 days, patient will follow up with [employer] to arrange an energy assessment as directed by SW completed . 12/07/18: Over the next 30 days patient will work with [employer] to determine eligibility for receiving financial assistance  CCM SW Interventions: Completed 12/07/18  Telephonic outreach by CCM SW to assess progress of patient stated goal  Confirmed the patient has completed the application to receive financial assistance from [employer] and submitted the application to the agency  Determined the patient is currently awaiting confirmation whether or not she qualifies for the program  Advised the patient to contact CCM SW once hearing from [employer] with determination  Informed by the patient she is continuing to make payments on her utility bill as she is able. The patient also reports she keeps her thermostat set to 76 degrees in hopes of lessening her monthly costs  Assessed for continued anxiety over amount owed; the patient reports "I know whether they help me or not I will be okay because God will provide"  Encouraged the patient to continue leaning on her faith to alleviate stress and anxiety towards current challenges  Patient Self Care Activities:  . Self administers medications as  prescribed . Attends all scheduled provider appointments . Calls pharmacy for medication refills . Attends church or other social activities . Performs ADL's independently  Please see past updates related to this goal by clicking on the "Past Updates" button in the selected goal          Follow Up Plan: SW will follow up with patient by phone over the next 3-4 weeks  Kendra Humble, BSW, CDP Social Worker, Certified Dementia Practitioner [facility] [phone]     

## 2018-12-08 ENCOUNTER — Telehealth: Payer: Self-pay

## 2018-12-21 ENCOUNTER — Ambulatory Visit: Payer: Medicare Other | Admitting: Rheumatology

## 2018-12-21 ENCOUNTER — Ambulatory Visit (INDEPENDENT_AMBULATORY_CARE_PROVIDER_SITE_OTHER): Payer: Medicare Other

## 2018-12-21 DIAGNOSIS — I1310 Hypertensive heart and chronic kidney disease without heart failure, with stage 1 through stage 4 chronic kidney disease, or unspecified chronic kidney disease: Secondary | ICD-10-CM | POA: Diagnosis not present

## 2018-12-21 DIAGNOSIS — N182 Chronic kidney disease, stage 2 (mild): Secondary | ICD-10-CM

## 2018-12-21 DIAGNOSIS — Z794 Long term (current) use of insulin: Secondary | ICD-10-CM

## 2018-12-21 DIAGNOSIS — E1122 Type 2 diabetes mellitus with diabetic chronic kidney disease: Secondary | ICD-10-CM | POA: Diagnosis not present

## 2018-12-21 NOTE — Chronic Care Management (AMB) (Signed)
  Chronic Care Management   Outreach Note  12/21/2018 Name: Ellary Casamento MRN: [id number] DOB: February 17, 1951  Referred by: Glendale Chard, MD Reason for referral : Chronic Care Management (CCM RNCM Telephone Follow up )   An unsuccessful telephone outreach was attempted today. The patient was referred to the case management team by Glendale Chard MD for assistance with chronic care management and care coordination.   Follow Up Plan: Telephone follow up appointment with care management team member scheduled for: 12/22/18 per patient request  Barb Merino, RN, BSN, CCM Care Management Coordinator [facility]  Direct Phone: [phone]

## 2018-12-22 ENCOUNTER — Other Ambulatory Visit: Payer: Self-pay

## 2018-12-22 ENCOUNTER — Ambulatory Visit: Payer: Self-pay

## 2018-12-22 ENCOUNTER — Telehealth: Payer: Self-pay

## 2018-12-22 DIAGNOSIS — I1310 Hypertensive heart and chronic kidney disease without heart failure, with stage 1 through stage 4 chronic kidney disease, or unspecified chronic kidney disease: Secondary | ICD-10-CM

## 2018-12-22 DIAGNOSIS — E1122 Type 2 diabetes mellitus with diabetic chronic kidney disease: Secondary | ICD-10-CM

## 2018-12-22 DIAGNOSIS — N182 Chronic kidney disease, stage 2 (mild): Secondary | ICD-10-CM

## 2018-12-22 NOTE — Chronic Care Management (AMB) (Signed)
Chronic Care Management   Follow Up Note   12/22/2018 Name: [name] [name] MRN: [id number] DOB: Jun 06, 1951  Referred by: Glendale Chard, MD Reason for referral : Chronic Care Management (CCM RNCM Telephone Follow Up )   [name] [name] is a 68 y.o. year old female who is a primary care patient of Glendale Chard, MD. The CCM team was consulted for assistance with chronic disease management and care coordination needs.    Review of patient status, including review of consultants reports, relevant laboratory and other test results, and collaboration with appropriate care team members and the patient's provider was performed as part of comprehensive patient evaluation and provision of chronic care management services.    I spoke with [name] [name] by telephone today to f/u on her Diabetes.   Goals Addressed      Patient Stated    "I don't want to be Diabetic anymore" (pt-stated)   On track    Current Barriers:   Knowledge Deficits related to disease process and Self Health Management of Diabetes  Nurse Case Manager Clinical Goal(s):   Over the next 90 days, patient will work with RN CM to address needs related to weight loss plan and Diabetes 11/17/18: Target goal date revised to 90 days secondary to COVID-19 treatment delays -12/22/18 Goal Met  Over the next 30 days, patient will have increased understanding of what foods to eat per recommendations provided by the American Diabetic Association   CCM RN CM Interventions: 12/22/18 call completed with patient   Assessed for ongoing adherence to following her prescribed txt plan for diabetes including, following ADA diet, implementing 150 mins of exercise weekly and taking diabetic medications exactly as prescribed - patient states she is adhering and recently started taking her Trulicity as directed by Dr. Baird Cancer - patient reports she is holding her [place] for post prandial BG<125 - patient reports she is walking 5 days out of 7 in the  early morning with a small group of friends and is feeling better overall  Assessed for self monitoring of CBG's (pt reports average am CBG is running between 80's-90's-- pt is self monitoring her CBG's twice daily  Positive reinforcement provided for patient adherence with her Diabetes treatment regimen  Reinforced utilizing portion control and using the plate method   Discussed patient's A1C and target A1C to be < 7.0  Discussed upcoming PCP follow with Dr. Baird Cancer date/time; recommended fasting for labs  Confirmed patient has the contact # for RN CM and discussed hours of nurse availability  Discussed plans with patient for ongoing care management follow up and provided patient with direct contact information for care management team  Mailed printed diabetic patient information sheet on what foods to eat  Patient Self Care Activities:   Verbalizes understanding of the education/information provided   Self administers medications as prescribed  Attends all scheduled provider appointments  Calls pharmacy for medication refills  Attends church or other social activities  Performs ADL's independently  Performs IADL's independently  Calls provider office for new concerns or questions  Please see past updates related to this goal by clicking on the "Past Updates" button in the selected goal         The care management team will reach out to the patient again over the next 14-21 days.    Barb Merino, RN, BSN, CCM Care Management Coordinator [facility]  Direct Phone: [phone]

## 2018-12-22 NOTE — Patient Instructions (Signed)
Visit Information  Goals Addressed      Patient Stated   . "I don't want to be Diabetic anymore" (pt-stated)   On track    Current Barriers:  Marland Kitchen Knowledge Deficits related to disease process and Self Health Management of Diabetes  Nurse Case Manager Clinical Goal(s):  Marland Kitchen Over the next 90 days, patient will work with RN CM to address needs related to weight loss plan and Diabetes 11/17/18: Target goal date revised to 90 days secondary to COVID-19 treatment delays -12/22/18 Goal Met . Over the next 30 days, patient will have increased understanding of what foods to eat per recommendations provided by the American Diabetic Association   CCM RN CM Interventions: 12/22/18 call completed with patient   Assessed for ongoing adherence to following her prescribed txt plan for diabetes including, following ADA diet, implementing 150 mins of exercise weekly and taking diabetic medications exactly as prescribed - patient states she is adhering and recently started taking her Trulicity as directed by Dr. Baird Cancer - patient reports she is holding her [place] for post prandial BG<125 - patient reports she is walking 5 days out of 7 in the early morning with a small group of friends and is feeling better overall  Assessed for self monitoring of CBG's (pt reports average am CBG is running between 80's-90's-- pt is self monitoring her CBG's twice daily  Positive reinforcement provided for patient adherence with her Diabetes treatment regimen  Reinforced utilizing portion control and using the plate method   Discussed patient's A1C and target A1C to be < 7.0  Discussed upcoming PCP follow with Dr. Baird Cancer date/time; recommended fasting for labs  Confirmed patient has the contact # for RN CM and discussed hours of nurse availability  Discussed plans with patient for ongoing care management follow up and provided patient with direct contact information for care management team  Mailed printed diabetic  patient information sheet on what foods to eat  Patient Self Care Activities:   Verbalizes understanding of the education/information provided  . Self administers medications as prescribed . Attends all scheduled provider appointments . Calls pharmacy for medication refills . Attends church or other social activities . Performs ADL's independently . Performs IADL's independently . Calls provider office for new concerns or questions  Please see past updates related to this goal by clicking on the "Past Updates" button in the selected goal          The patient verbalized understanding of instructions provided today and declined a print copy of patient instruction materials.   The care management team will reach out to the patient again over the next 14-21 days.   Barb Merino, RN, BSN, CCM Care Management Coordinator [place] [facility]  Direct Phone: [phone]

## 2018-12-29 ENCOUNTER — Telehealth: Payer: Self-pay

## 2019-01-04 ENCOUNTER — Ambulatory Visit: Payer: Self-pay

## 2019-01-04 ENCOUNTER — Telehealth: Payer: Self-pay

## 2019-01-04 DIAGNOSIS — Z599 Problem related to housing and economic circumstances, unspecified: Secondary | ICD-10-CM

## 2019-01-04 DIAGNOSIS — I10 Essential (primary) hypertension: Secondary | ICD-10-CM

## 2019-01-04 DIAGNOSIS — Z598 Other problems related to housing and economic circumstances: Secondary | ICD-10-CM

## 2019-01-04 DIAGNOSIS — N182 Chronic kidney disease, stage 2 (mild): Secondary | ICD-10-CM

## 2019-01-04 DIAGNOSIS — E1122 Type 2 diabetes mellitus with diabetic chronic kidney disease: Secondary | ICD-10-CM

## 2019-01-04 NOTE — Chronic Care Management (AMB) (Signed)
  Chronic Care Management   Outreach Note  01/04/2019 Name: [name] [name] MRN: [id number] DOB: 20-Jul-1950  Referred by: Glendale Chard, MD Reason for referral : Care Coordination   An unsuccessful outreach call was placed to the patient in an attempt to assess progression of patient stated goals. CCM SW left a HIPAA compliant voice message requesting a return call.  Follow Up Plan: CCM SW will plan to outreach the patient over the next two weeks.  Daneen Schick, BSW, CDP Social Worker, Certified Dementia Practitioner [place] [facility] [place] [facility] [phone]  Total time spent performing care coordination and/or care management activities with the patient by phone or face to face = 5 minutes.

## 2019-01-05 ENCOUNTER — Ambulatory Visit: Payer: Medicare Other | Admitting: Internal Medicine

## 2019-01-12 ENCOUNTER — Telehealth: Payer: Self-pay

## 2019-01-18 ENCOUNTER — Telehealth: Payer: Self-pay

## 2019-01-18 ENCOUNTER — Ambulatory Visit: Payer: Self-pay

## 2019-01-18 DIAGNOSIS — N182 Chronic kidney disease, stage 2 (mild): Secondary | ICD-10-CM

## 2019-01-18 DIAGNOSIS — Z599 Problem related to housing and economic circumstances, unspecified: Secondary | ICD-10-CM

## 2019-01-18 DIAGNOSIS — Z598 Other problems related to housing and economic circumstances: Secondary | ICD-10-CM

## 2019-01-18 DIAGNOSIS — I10 Essential (primary) hypertension: Secondary | ICD-10-CM

## 2019-01-18 DIAGNOSIS — E1122 Type 2 diabetes mellitus with diabetic chronic kidney disease: Secondary | ICD-10-CM

## 2019-01-18 NOTE — Patient Instructions (Signed)
Social Worker Visit Information  Goals we discussed today:  Goals Addressed            This Visit's Progress     Patient Stated   . "I get anxiety about my power bill" (pt-stated)       Current Barriers:  . Financial constraints  . Increased power bill since recent apartment renovation . LIE[employer] Program assistance has already been used. Patient unaware of other resources available to assist  Clinical Social Work Clinical Goal(s):  Marland Kitchen Over the next 20 days, patient will work with SW to address concerns related to identifying community resources to assist with utility costs not met - re-established 5/13 completed 12/07/18 . Over the next 10 days, patient will follow up with [employer] to arrange an energy assessment as directed by SW completed . 12/07/18: Over the next 30 days patient will work with [employer] to determine eligibility for receiving financial assistance Not met- re-established 01/18/19  CCM SW Interventions: Completed 01/18/19  Inbound call received from patient in response to previous unsuccessful outbound calls  Determined the patient has completed and submitted the online application to receive financial assistance from [employer] [facility]) but the patient has yet to be contacted regarding eligibility  Outbound call placed to [facility] with patient on the call, voice message left requesting return call regarding patients application for assistance  Scheduled follow up call to [facility] if a return call is not received within the next 48 hours  Assessed for patients progress with current outstanding utility bill - the patient reports she currently owes $320 and is making small payments as she is able  Encouraged the patient to continue making payments as her budget allows  Patient Self Care Activities:  . Self administers medications as prescribed . Attends all scheduled provider appointments . Calls pharmacy for medication  refills . Attends church or other social activities . Performs ADL's independently  Please see past updates related to this goal by clicking on the "Past Updates" button in the selected goal          Follow Up Plan: CCM SW will contact [facility] over the next 3 days if return call is not received.   Daneen Schick, BSW, CDP Social Worker, Certified Dementia Practitioner [place] [facility] [place] [facility] [phone]

## 2019-01-18 NOTE — Chronic Care Management (AMB) (Signed)
  Chronic Care Management   Social Work Follow Up Note  01/18/2019 Name: [name] [name] MRN: [id number] DOB: 12/25/1950  [name] [name] is a 68 y.o. year old female who is a primary care patient of Glendale Chard, MD. The CCM team was consulted for assistance with [name].   Review of patient status, including review of consultants reports, other relevant assessments, and collaboration with appropriate care team members and the patient's provider was performed as part of comprehensive patient evaluation and provision of chronic care management services.     Goals Addressed            This Visit's Progress     Patient Stated   . "I get anxiety about my power bill" (pt-stated)       Current Barriers:  . Financial constraints  . Increased power bill since recent apartment renovation . [employer] Program assistance has already been used. Patient unaware of other resources available to assist  Clinical Social Work Clinical Goal(s):  Marland Kitchen Over the next 20 days, patient will work with SW to address concerns related to identifying community resources to assist with utility costs not met - re-established 5/13 completed 12/07/18 . Over the next 10 days, patient will follow up with [employer] to arrange an energy assessment as directed by SW completed . 12/07/18: Over the next 30 days patient will work with [employer] to determine eligibility for receiving financial assistance Not met- re-established 01/18/19  CCM SW Interventions: Completed 01/18/19  Inbound call received from patient in response to previous unsuccessful outbound calls  Determined the patient has completed and submitted the online application to receive financial assistance from [employer] [facility]) but the patient has yet to be contacted regarding eligibility  Outbound call placed to [facility] with patient on the call, voice message left requesting return call regarding patients application  for assistance  Scheduled follow up call to [facility] if a return call is not received within the next 48 hours  Assessed for patients progress with current outstanding utility bill - the patient reports she currently owes $320 and is making small payments as she is able  Encouraged the patient to continue making payments as her budget allows  Patient Self Care Activities:  . Self administers medications as prescribed . Attends all scheduled provider appointments . Calls pharmacy for medication refills . Attends church or other social activities . Performs ADL's independently  Please see past updates related to this goal by clicking on the "Past Updates" button in the selected goal          Follow Up Plan: SW will follow up with [facility] over the next 3 days if return call is not received.   Daneen Schick, BSW, CDP Social Worker, Certified Dementia Practitioner [place] [facility] [place] [facility] [phone]  Total time spent performing care coordination and/or care management activities with the patient by phone or face to face = 9 minutes.

## 2019-01-18 NOTE — Chronic Care Management (AMB) (Signed)
  Chronic Care Management   Outreach Note  01/18/2019 Name: [name] [name] MRN: [id number] DOB: 1951/06/14  Referred by: Glendale Chard, MD Reason for referral : Care Coordination   A second unsuccessful telephone outreach was attempted today in effort of following up on patient referral to [employer] for financial assistance. CCM SW left a HIPAA compliant voice message requesting a return call.  Follow Up Plan: The care management team will reach out to the patient again over the next 14 days.   Daneen Schick, BSW, CDP Social Worker, Certified Dementia Practitioner [place] [facility] [place] [facility] [phone]  Total time spent performing care coordination and/or care management activities with the patient by phone or face to face = 5 minutes.

## 2019-01-21 ENCOUNTER — Ambulatory Visit: Payer: Self-pay

## 2019-01-21 DIAGNOSIS — Z598 Other problems related to housing and economic circumstances: Secondary | ICD-10-CM

## 2019-01-21 DIAGNOSIS — Z599 Problem related to housing and economic circumstances, unspecified: Secondary | ICD-10-CM

## 2019-01-21 NOTE — Chronic Care Management (AMB) (Signed)
  Chronic Care Management   Social Work Follow Up Note  01/21/2019 Name: [name] [name] MRN: [id number] DOB: Jan 03, 1951  [name] [name] is a 68 y.o. year old female who is a primary care patient of Glendale Chard, MD. The CCM team was consulted for assistance with [name] .   Review of patient status, including review of consultants reports, other relevant assessments, and collaboration with appropriate care team members and the patient's provider was performed as part of comprehensive patient evaluation and provision of chronic care management services.     Goals Addressed            This Visit's Progress     Patient Stated   . "I get anxiety about my power bill" (pt-stated)   Not on track    Current Barriers:  . Financial constraints  . Increased power bill since recent apartment renovation . [employer] Program assistance has already been used. Patient unaware of other resources available to assist  Clinical Social Work Clinical Goal(s):  Marland Kitchen Over the next 20 days, patient will work with SW to address concerns related to identifying community resources to assist with utility costs not met - re-established 5/13 completed 12/07/18 . Over the next 10 days, patient will follow up with [employer] to arrange an energy assessment as directed by SW completed . 12/07/18: Over the next 30 days patient will work with [employer] to determine eligibility for receiving financial assistance Not met- re-established 01/18/19  CCM SW Interventions: Completed 01/21/19  Outbound call to [employer] to request status of patients application  Transferred to voice mailbox for Mr. Tomasita Morrow  Voice message left requesting a return call  Patient Self Care Activities:  . Self administers medications as prescribed . Attends all scheduled provider appointments . Calls pharmacy for medication refills . Attends church or other social activities . Performs ADL's  independently  Please see past updates related to this goal by clicking on the "Past Updates" button in the selected goal          Follow Up Plan: SW will follow up with patient by phone over the next 10 days  Daneen Schick, BSW, CDP Social Worker, Certified Dementia Practitioner [place] [facility] [place] [facility] [phone]  Total time spent performing care coordination and/or care management activities with the patient by phone or face to face = 6 minutes.

## 2019-01-21 NOTE — Patient Instructions (Signed)
Social Worker Visit Information  Goals we discussed today:  Goals Addressed            This Visit's Progress     Patient Stated   . "I get anxiety about my power bill" (pt-stated)   Not on track    Current Barriers:  . Financial constraints  . Increased power bill since recent apartment renovation . [employer] Program assistance has already been used. Patient unaware of other resources available to assist  Clinical Social Work Clinical Goal(s):  Marland Kitchen Over the next 20 days, patient will work with SW to address concerns related to identifying community resources to assist with utility costs not met - re-established 5/13 completed 12/07/18 . Over the next 10 days, patient will follow up with [employer] to arrange an energy assessment as directed by SW completed . 12/07/18: Over the next 30 days patient will work with [employer] to determine eligibility for receiving financial assistance Not met- re-established 01/18/19  CCM SW Interventions: Completed 01/21/19  Outbound call to [employer] to request status of patients application  Transferred to voice mailbox for Mr. Tomasita Morrow  Voice message left requesting a return call  Patient Self Care Activities:  . Self administers medications as prescribed . Attends all scheduled provider appointments . Calls pharmacy for medication refills . Attends church or other social activities . Performs ADL's independently  Please see past updates related to this goal by clicking on the "Past Updates" button in the selected goal          Materials Provided: No. Patient not reached.  Follow Up Plan: SW will follow up with patient by phone over the next 10 days   Daneen Schick, BSW, CDP Social Worker, Certified Dementia Practitioner [place] [facility] [place] [facility] [phone]

## 2019-01-26 ENCOUNTER — Other Ambulatory Visit: Payer: Self-pay | Admitting: Internal Medicine

## 2019-01-26 ENCOUNTER — Ambulatory Visit (INDEPENDENT_AMBULATORY_CARE_PROVIDER_SITE_OTHER): Payer: Medicare Other

## 2019-01-26 ENCOUNTER — Encounter: Payer: Self-pay | Admitting: Internal Medicine

## 2019-01-26 ENCOUNTER — Other Ambulatory Visit: Payer: Self-pay

## 2019-01-26 ENCOUNTER — Ambulatory Visit (INDEPENDENT_AMBULATORY_CARE_PROVIDER_SITE_OTHER): Payer: Medicare Other | Admitting: Internal Medicine

## 2019-01-26 VITALS — BP 116/74 | HR 81 | Temp 98.8°F | Ht 64.5 in | Wt 196.4 lb

## 2019-01-26 DIAGNOSIS — I129 Hypertensive chronic kidney disease with stage 1 through stage 4 chronic kidney disease, or unspecified chronic kidney disease: Secondary | ICD-10-CM

## 2019-01-26 DIAGNOSIS — B182 Chronic viral hepatitis C: Secondary | ICD-10-CM

## 2019-01-26 DIAGNOSIS — Z79899 Other long term (current) drug therapy: Secondary | ICD-10-CM

## 2019-01-26 DIAGNOSIS — N182 Chronic kidney disease, stage 2 (mild): Secondary | ICD-10-CM

## 2019-01-26 DIAGNOSIS — R351 Nocturia: Secondary | ICD-10-CM

## 2019-01-26 DIAGNOSIS — I10 Essential (primary) hypertension: Secondary | ICD-10-CM

## 2019-01-26 DIAGNOSIS — Z0000 Encounter for general adult medical examination without abnormal findings: Secondary | ICD-10-CM

## 2019-01-26 DIAGNOSIS — E6609 Other obesity due to excess calories: Secondary | ICD-10-CM

## 2019-01-26 DIAGNOSIS — R600 Localized edema: Secondary | ICD-10-CM | POA: Diagnosis not present

## 2019-01-26 DIAGNOSIS — Z794 Long term (current) use of insulin: Secondary | ICD-10-CM | POA: Diagnosis not present

## 2019-01-26 DIAGNOSIS — E1122 Type 2 diabetes mellitus with diabetic chronic kidney disease: Secondary | ICD-10-CM | POA: Diagnosis not present

## 2019-01-26 DIAGNOSIS — Z6833 Body mass index (BMI) 33.0-33.9, adult: Secondary | ICD-10-CM

## 2019-01-26 DIAGNOSIS — I739 Peripheral vascular disease, unspecified: Secondary | ICD-10-CM

## 2019-01-26 DIAGNOSIS — R0602 Shortness of breath: Secondary | ICD-10-CM | POA: Diagnosis not present

## 2019-01-26 NOTE — Patient Instructions (Signed)
[name] [name] , Thank you for taking time to come for your Medicare Wellness Visit. I appreciate your ongoing commitment to your health goals. Please review the following plan we discussed and let me know if I can assist you in the future.   Screening recommendations/referrals: Colonoscopy: 04/2017 Mammogram: 11/2018 Bone Density: 05/2010 Recommended yearly ophthalmology/optometry visit for glaucoma screening and checkup Recommended yearly dental visit for hygiene and checkup  Vaccinations: Influenza vaccine: 02/2018 Pneumococcal vaccine: 10/2015 Tdap vaccine: 11/2014 Shingles vaccine: 01/2019    Advanced directives: Advance directive discussed with you today. Even though you declined this today please call our office should you change your mind and we can give you the proper paperwork for you to fill out.   Conditions/risks identified: obesity  Next appointment: 03/23/2019 at 9:30   Preventive Care 68 Years and Older, Female Preventive care refers to lifestyle choices and visits with your health care provider that can promote health and wellness. What does preventive care include?  A yearly physical exam. This is also called an annual well check.  Dental exams once or twice a year.  Routine eye exams. Ask your health care provider how often you should have your eyes checked.  Personal lifestyle choices, including:  Daily care of your teeth and gums.  Regular physical activity.  Eating a healthy diet.  Avoiding tobacco and drug use.  Limiting alcohol use.  Practicing safe sex.  Taking low-dose aspirin every day.  Taking vitamin and mineral supplements as recommended by your health care provider. What happens during an annual well check? The services and screenings done by your health care provider during your annual well check will depend on your age, overall health, lifestyle risk factors, and family history of disease. Counseling  Your health care provider may ask you  questions about your:  Alcohol use.  Tobacco use.  Drug use.  Emotional well-being.  Home and relationship well-being.  Sexual activity.  Eating habits.  History of falls.  Memory and ability to understand (cognition).  Work and work Statistician.  Reproductive health. Screening  You may have the following tests or measurements:  Height, weight, and BMI.  Blood pressure.  Lipid and cholesterol levels. These may be checked every 5 years, or more frequently if you are over 92 years old.  Skin check.  Lung cancer screening. You may have this screening every year starting at age 40 if you have a 30-pack-year history of smoking and currently smoke or have quit within the past 15 years.  Fecal occult blood test (FOBT) of the stool. You may have this test every year starting at age 31.  Flexible sigmoidoscopy or colonoscopy. You may have a sigmoidoscopy every 5 years or a colonoscopy every 10 years starting at age 6.  Hepatitis C blood test.  Hepatitis B blood test.  Sexually transmitted disease (STD) testing.  Diabetes screening. This is done by checking your blood sugar (glucose) after you have not eaten for a while (fasting). You may have this done every 1-3 years.  Bone density scan. This is done to screen for osteoporosis. You may have this done starting at age 53.  Mammogram. This may be done every 1-2 years. Talk to your health care provider about how often you should have regular mammograms. Talk with your health care provider about your test results, treatment options, and if necessary, the need for more tests. Vaccines  Your health care provider may recommend certain vaccines, such as:  Influenza vaccine. This is recommended every  year.  Tetanus, diphtheria, and acellular pertussis (Tdap, Td) vaccine. You may need a Td booster every 10 years.  Zoster vaccine. You may need this after age 42.  Pneumococcal 13-valent conjugate (PCV13) vaccine. One dose is  recommended after age 19.  Pneumococcal polysaccharide (PPSV23) vaccine. One dose is recommended after age 72. Talk to your health care provider about which screenings and vaccines you need and how often you need them. This information is not intended to replace advice given to you by your health care provider. Make sure you discuss any questions you have with your health care provider. Document Released: 07/06/2015 Document Revised: 02/27/2016 Document Reviewed: 04/10/2015 Elsevier Interactive Patient Education  2017 [employer] Prevention in the Home Falls can cause injuries. They can happen to people of all ages. There are many things you can do to make your home safe and to help prevent falls. What can I do on the outside of my home?  Regularly fix the edges of walkways and driveways and fix any cracks.  Remove anything that might make you trip as you walk through a door, such as a raised step or threshold.  Trim any bushes or trees on the path to your home.  Use bright outdoor lighting.  Clear any walking paths of anything that might make someone trip, such as rocks or tools.  Regularly check to see if handrails are loose or broken. Make sure that both sides of any steps have handrails.  Any raised decks and porches should have guardrails on the edges.  Have any leaves, snow, or ice cleared regularly.  Use sand or salt on walking paths during winter.  Clean up any spills in your garage right away. This includes oil or grease spills. What can I do in the bathroom?  Use night lights.  Install grab bars by the toilet and in the tub and shower. Do not use towel bars as grab bars.  Use non-skid mats or decals in the tub or shower.  If you need to sit down in the shower, use a plastic, non-slip stool.  Keep the floor dry. Clean up any water that spills on the floor as soon as it happens.  Remove soap buildup in the tub or shower regularly.  Attach bath mats  securely with double-sided non-slip rug tape.  Do not have throw rugs and other things on the floor that can make you trip. What can I do in the bedroom?  Use night lights.  Make sure that you have a light by your bed that is easy to reach.  Do not use any sheets or blankets that are too big for your bed. They should not hang down onto the floor.  Have a firm chair that has side arms. You can use this for support while you get dressed.  Do not have throw rugs and other things on the floor that can make you trip. What can I do in the kitchen?  Clean up any spills right away.  Avoid walking on wet floors.  Keep items that you use a lot in easy-to-reach places.  If you need to reach something above you, use a strong step stool that has a grab bar.  Keep electrical cords out of the way.  Do not use floor polish or wax that makes floors slippery. If you must use wax, use non-skid floor wax.  Do not have throw rugs and other things on the floor that can make you trip. What can  I do with my stairs?  Do not leave any items on the stairs.  Make sure that there are handrails on both sides of the stairs and use them. Fix handrails that are broken or loose. Make sure that handrails are as long as the stairways.  Check any carpeting to make sure that it is firmly attached to the stairs. Fix any carpet that is loose or worn.  Avoid having throw rugs at the top or bottom of the stairs. If you do have throw rugs, attach them to the floor with carpet tape.  Make sure that you have a light switch at the top of the stairs and the bottom of the stairs. If you do not have them, ask someone to add them for you. What else can I do to help prevent falls?  Wear shoes that:  Do not have high heels.  Have rubber bottoms.  Are comfortable and fit you well.  Are closed at the toe. Do not wear sandals.  If you use a stepladder:  Make sure that it is fully opened. Do not climb a closed  stepladder.  Make sure that both sides of the stepladder are locked into place.  Ask someone to hold it for you, if possible.  Clearly mark and make sure that you can see:  Any grab bars or handrails.  First and last steps.  Where the edge of each step is.  Use tools that help you move around (mobility aids) if they are needed. These include:  Canes.  Walkers.  Scooters.  Crutches.  Turn on the lights when you go into a dark area. Replace any light bulbs as soon as they burn out.  Set up your furniture so you have a clear path. Avoid moving your furniture around.  If any of your floors are uneven, fix them.  If there are any pets around you, be aware of where they are.  Review your medicines with your doctor. Some medicines can make you feel dizzy. This can increase your chance of falling. Ask your doctor what other things that you can do to help prevent falls. This information is not intended to replace advice given to you by your health care provider. Make sure you discuss any questions you have with your health care provider. Document Released: 04/05/2009 Document Revised: 11/15/2015 Document Reviewed: 07/14/2014 Elsevier Interactive Patient Education  2017 [employer].

## 2019-01-26 NOTE — Progress Notes (Signed)
Subjective:   [name] [name] is a 68 y.o. female who presents for Medicare Annual (Subsequent) preventive examination.  Review of Systems:  n/a Cardiac Risk Factors include: advanced age (>58mn, >>36women);dyslipidemia;diabetes mellitus     Objective:     Vitals: BP 116/74 (BP Location: Left Arm, Patient Position: Sitting)   Pulse 81   Temp 98.8 F (37.1 C) (Oral)   Ht 5' 4.5" (1.638 m)   Wt 196 lb 6.4 oz (89.1 kg)   BMI 33.19 kg/m   Body mass index is 33.19 kg/m.  Advanced Directives 01/26/2019 05/19/2015 09/19/2014 08/03/2014  Does Patient Have a Medical Advance Directive? No No No No  Would patient like information on creating a medical advance directive? No - Patient declined No - patient declined information No - patient declined information -    Tobacco Social History   Tobacco Use  Smoking Status Former Smoker  . Packs/day: 0.50  . Years: 30.00  . Pack years: 15.00  . Types: Cigarettes  . Quit date: 12/20/2013  . Years since quitting: 5.1  Smokeless Tobacco Never Used     Counseling given: Not Answered   Clinical Intake:  Pre-visit preparation completed: Yes  Pain : No/denies pain     Nutritional Status: BMI > 30  Obese Nutritional Risks: None Diabetes: Yes CBG done?: No Did pt. bring in CBG monitor from home?: No  How often do you need to have someone help you when you read instructions, pamphlets, or other written materials from your doctor or pharmacy?: 1 - Never What is the last grade level you completed in school?: some college  Interpreter Needed?: No  Information entered by :: NAllen LPN  Past Medical History:  Diagnosis Date  . Anemia   . Carpal tunnel syndrome   . Coronary artery disease    DES to ostial RCA 2005  . [facility] [place]    No longer taking meds  . Dyslipidemia   . Hypertension   . Osteoarthritis   . [place]    Past Surgical History:  Procedure Laterality  Date  . APPENDECTOMY    . BREAST EXCISIONAL BIOPSY Right >10+ yrs ago   benign  . heart stent    . TONSILLECTOMY    . WRIST SURGERY     Family History  Problem Relation Age of Onset  . CAD Father 685 . CAD Mother 642 . CAD Brother   . Cancer Brother   . CAD Sister   . CAD Brother    Social History   Socioeconomic History  . Marital status: Single    Spouse name: Not on file  . Number of children: 2  . Years of education: Not on file  . Highest education level: Not on file  Occupational History  . Occupation: retired  SScientific laboratory technician . Financial resource strain: Not hard at all  . Food insecurity    Worry: Never true    Inability: Never true  . Transportation needs    Medical: No    Non-medical: No  Tobacco Use  . Smoking status: Former Smoker    Packs/day: 0.50    Years: 30.00    Pack years: 15.00    Types: Cigarettes    Quit date: 12/20/2013    Years since quitting: 5.1  . Smokeless tobacco: Never Used  Substance and Sexual Activity  . Alcohol use: Yes    Comment: rarely  . Drug use: Not Currently  .  Sexual activity: Not Currently  Lifestyle  . Physical activity    Days per week: 3 days    Minutes per session: 40 min  . Stress: Only a little  Relationships  . Social connections    Talks on phone: More than three times a week    Gets together: More than three times a week    Attends religious service: More than 4 times per year    Active member of club or organization: Yes    Attends meetings of clubs or organizations: More than 4 times per year    Relationship status: Not on file  Other Topics Concern  . Not on file  Social History Narrative   Lives alone.   One son died in a car accident.      Outpatient Encounter Medications as of 01/26/2019  Medication Sig  . amLODipine (NORVASC) 10 MG tablet TAKE 1 TABLET(10 MG) BY MOUTH DAILY  . aspirin 81 MG tablet Take 81 mg by mouth daily.  . BD PEN NEEDLE NANO U/F 32G X 4 MM MISC USE TO INJECT INSULIN DAILY   . beta carotene 25000 UNIT capsule Take 25,000 Units by mouth daily.  Marland Kitchen BIOTIN PO Take 500 mcg by mouth daily.   . Cholecalciferol ([employer] VIT D 5000 HIGH-POTENCY PO) Take 1 tablet by mouth daily.   . CHROMIUM PICOLINATE PO Take 200 mcg by mouth daily.   . Coenzyme Q10 (COQ-10) 100 MG CAPS Take 1 tablet by mouth daily.   . Cyanocobalamin (VITAMIN B-12 PO) Take 500 mcg by mouth.   . Dulaglutide (TRULICITY) 1.27 NT/7.0YF SOPN Inject 0.75 mg into the skin once a week.  . Flaxseed, Linseed, (FLAXSEED OIL) 1000 MG CAPS Take 1 capsule by mouth daily.   Marland Kitchen glucosamine-chondroitin 500-400 MG tablet Take 1 tablet by mouth daily. Reported on 12/17/2015  . glucose blood (ONETOUCH VERIO) test strip CHECK BLOOD SUGAR THREE TIMES DAILY dx: e11.65  . Krill Oil 350 MG CAPS Take 350 mg by mouth daily.   . magnesium oxide (MAG-OX) 400 MG tablet Take 400 mg by mouth daily.  . metoprolol tartrate (LOPRESSOR) 50 MG tablet TAKE 1 TABLET BY MOUTH TWICE DAILY  . ONETOUCH DELICA LANCETS 74B MISC USE AS DIRECTED TO CHECK BLOOD SUGAR THREE TIMES DAILY  . pregabalin (LYRICA) 50 MG capsule TAKE 1 CAPSULE(50 MG) BY MOUTH TWICE DAILY  . rosuvastatin (CRESTOR) 20 MG tablet Take 1 tablet (20 mg total) by mouth daily.  . traZODone (DESYREL) 50 MG tablet TAKE 1 TABLET BY MOUTH EVERY NIGHT AT BEDTIME  . TRESIBA FLEXTOUCH 200 UNIT/ML SOPN INJECT 34 UNITS UNDER THE SKIN EVERY NIGHT AT BEDTIME, MAX DOSE IS 80 UNITS (Patient taking differently: 34 Units. Patient does not take the [place] for BG <125)  . vitamin C (ASCORBIC ACID) 500 MG tablet Take 1,000 mg by mouth 2 (two) times daily.   . vitamin E 200 UNIT capsule Take 200 Units by mouth daily.   No facility-administered encounter medications on file as of 01/26/2019.     Activities of Daily Living In your present state of health, do you have any difficulty performing the following activities: 01/26/2019 09/09/2018  Hearing? N N  Vision? Y Y  Comment blurry sometimes -  Difficulty  concentrating or making decisions? N N  Walking or climbing stairs? N N  Dressing or bathing? N N  Doing errands, shopping? N N  Preparing Food and eating ? N N  Using the Toilet? N N  In  the past six months, have you accidently leaked urine? Y N  Comment waiting to the last minute -  Do you have problems with loss of bowel control? N N  Managing your Medications? N N  Managing your Finances? N N  Housekeeping or managing your Housekeeping? N N  Some recent data might be hidden    Patient Care Team: Glendale Chard, MD as PCP - General (Internal Medicine) Minus Breeding, MD as PCP - Cardiology (Cardiology) Daneen Schick as Social Worker Little, Claudette Stapler, RN as Case Manager Lavera Guise, [facility] (Pharmacist)    Assessment:   This is a routine wellness examination for [place].  Exercise Activities and Dietary recommendations Current Exercise Habits: Home exercise routine, Type of exercise: walking;calisthenics, Time (Minutes): 40, Frequency (Times/Week): 3, Weekly Exercise (Minutes/Week): 120  Goals    . "I don't want to be Diabetic anymore" (pt-stated)     Current Barriers:  Marland Kitchen Knowledge Deficits related to disease process and Self Health Management of Diabetes  Nurse Case Manager Clinical Goal(s):  Marland Kitchen Over the next 90 days, patient will work with RN CM to address needs related to weight loss plan and Diabetes 11/17/18: Target goal date revised to 90 days secondary to COVID-19 treatment delays -12/22/18 Goal Met . Over the next 30 days, patient will have increased understanding of what foods to eat per recommendations provided by the American Diabetic Association   CCM RN CM Interventions: 12/22/18 call completed with patient   Assessed for ongoing adherence to following her prescribed txt plan for diabetes including, following ADA diet, implementing 150 mins of exercise weekly and taking diabetic medications exactly as prescribed - patient states she is adhering and recently  started taking her Trulicity as directed by Dr. Baird Cancer - patient reports she is holding her [place] for post prandial BG<125 - patient reports she is walking 5 days out of 7 in the early morning with a small group of friends and is feeling better overall  Assessed for self monitoring of CBG's (pt reports average am CBG is running between 80's-90's-- pt is self monitoring her CBG's twice daily  Positive reinforcement provided for patient adherence with her Diabetes treatment regimen  Reinforced utilizing portion control and using the plate method   Discussed patient's A1C and target A1C to be < 7.0  Discussed upcoming PCP follow with Dr. Baird Cancer date/time; recommended fasting for labs  Confirmed patient has the contact # for RN CM and discussed hours of nurse availability  Discussed plans with patient for ongoing care management follow up and provided patient with direct contact information for care management team  Mailed printed diabetic patient information sheet on what foods to eat  Internal collaboration with embedded PharmD Lottie Dawson with patient update  Patient Self Care Activities:   Verbalizes understanding of the education/information provided  . Self administers medications as prescribed . Attends all scheduled provider appointments . Calls pharmacy for medication refills . Attends church or other social activities . Performs ADL's independently . Performs IADL's independently . Calls provider office for new concerns or questions  Please see past updates related to this goal by clicking on the "Past Updates" button in the selected goal       . "I get anxiety about my power bill" (pt-stated)     Current Barriers:  . Financial constraints  . Increased power bill since recent apartment renovation . [employer] Program assistance has already been used. Patient unaware of other resources available to assist  Clinical Social  Work Clinical Goal(s):  Marland Kitchen Over the next 20 days,  patient will work with SW to address concerns related to identifying community resources to assist with utility costs not met - re-established 5/13 completed 12/07/18 . Over the next 10 days, patient will follow up with [employer] to arrange an energy assessment as directed by SW completed . 12/07/18: Over the next 30 days patient will work with [employer] to determine eligibility for receiving financial assistance Not met- re-established 01/18/19  CCM SW Interventions: Completed 01/21/19  Outbound call to [employer] to request status of patients application  Transferred to voice mailbox for Mr. Tomasita Morrow  Voice message left requesting a return call  Patient Self Care Activities:  . Self administers medications as prescribed . Attends all scheduled provider appointments . Calls pharmacy for medication refills . Attends church or other social activities . Performs ADL's independently  Please see past updates related to this goal by clicking on the "Past Updates" button in the selected goal      . I would like to reduce my A1c  (pt-stated)     Current Barriers:  . Non Adherence to DM health diet/lifestyle (current A1c 9.4% on 08/19/18)  Pharmacist Clinical Goal(s):  Marland Kitchen Over the next 60 days, patient will demonstrate Improved medication adherence as evidenced by daily medication adherence, FBG <130 and controlled BGs throughout the day: 11/17/18: Target Goal Date revised to 60 days due to treatment delays secondary to COVID-19  CCM RN CM Interventions: Completed on 11/17/18: completed call with patient  . Comprehensive medication review performed. . Discussed plans with patient for ongoing care management follow up and provided patient with direct contact information for care management team  . Reinforced importance of continuing to take medications as prescribed . Encouraged patient to contact Dr. Baird Cancer and or a CCM team member for  questions/concerns related to her medication regimen  Patient Self Care Activities:  . Self administers medications as prescribed . Attends all scheduled provider appointments . Calls pharmacy for medication refills  Please see past updates related to this goal by clicking on the "Past Updates" button in the selected goal      . I would like to review my medications for drug interactions (pt-stated)     Current Barriers:  Marland Kitchen Knowledge Deficits related to pharmacotherapy  Pharmacist Clinical Goal(s):  Marland Kitchen Over the next 60 days, polypharmacy will be reduced as evidenced by decreasing the number of vitamins/supplements patient is taking 11/17/18: Target Goal Date revised to 60 days due to treatment delays secondary to COVID-19  CCM RN CM Interventions: Completed on 11/17/18: completed call with patient   Comprehensive medication review performed  Assessed for questions/concerns related to prescribed medication regimen - pt denies at this time  Assessed for good effectiveness from medications w/o SE - pt denies at this time  Discussed plans with patient for ongoing care management follow up and provided patient with direct contact information for care management team   Patient Self Care Activities:  . Attends all scheduled provider appointments . Calls pharmacy for medication refills . Calls provider office for new concerns or questions  Please see past updates related to this goal by clicking on the "Past Updates" button in the selected goal      . Patient Stated     01/26/2019, Get off diabetic medication and lose weight       Fall Risk Fall Risk  01/26/2019 01/26/2019 11/23/2018 08/19/2018 06/29/2018  Falls in  the past year? 0 0 0 0 0  Risk for fall due to : Medication side effect - - - -  Follow up Falls evaluation completed;Falls prevention discussed - - - -   Is the patient's home free of loose throw rugs in walkways, pet beds, electrical cords, etc?   yes      Grab bars in the  bathroom? yes      Handrails on the stairs?   n/a      Adequate lighting?   no  Timed Get Up and Go performed: n/a  Depression Screen PHQ 2/9 Scores 01/26/2019 11/23/2018 09/09/2018 08/19/2018  PHQ - 2 Score 2 0 0 0  PHQ- 9 Score 10 - - -     Cognitive Function     6CIT Screen 01/26/2019  What Year? 0 points  What month? 0 points  What time? 0 points  Count back from 20 0 points  Months in reverse 0 points  Repeat phrase 0 points  Total Score 0    Immunization History  Administered Date(s) Administered  . Influenza, High Dose Seasonal PF 03/11/2018  . Zoster Recombinat (Shingrix) 01/25/2019    Qualifies for Shingles Vaccine? yes  Screening Tests Health Maintenance  Topic Date Due  . FOOT EXAM  11/04/1960  . OPHTHALMOLOGY EXAM  11/04/1960  . TETANUS/TDAP  11/04/1969  . PNA vac Low Risk Adult (1 of 2 - PCV13) 11/05/2015  . INFLUENZA VACCINE  01/22/2019  . HEMOGLOBIN A1C  05/25/2019  . URINE MICROALBUMIN  08/20/2019  . MAMMOGRAM  11/21/2020  . COLONOSCOPY  05/22/2027  . DEXA SCAN  Completed  . Hepatitis C Screening  Completed    Cancer Screenings: Lung: Low Dose CT Chest recommended if Age 74-80 years, 30 pack-year currently smoking OR have quit w/in 15years. Patient does not qualify. Breast:  Up to date on Mammogram? Yes   Up to date of Bone Density/Dexa? Yes Colorectal: up to date  Additional Screenings: : Hepatitis C Screening: 09/13/2015     Plan:    Patient would like to get off diabetic meds and lose weight.   I have personally reviewed and noted the following in the patient's chart:   . Medical and social history . Use of alcohol, tobacco or illicit drugs  . Current medications and supplements . Functional ability and status . Nutritional status . Physical activity . Advanced directives . List of other physicians . Hospitalizations, surgeries, and ER visits in previous 12 months . Vitals . Screenings to include cognitive, depression, and falls .  Referrals and appointments  In addition, I have reviewed and discussed with patient certain preventive protocols, quality metrics, and best practice recommendations. A written personalized care plan for preventive services as well as general preventive health recommendations were provided to patient.     Kellie Simmering, LPN  08/29/4663

## 2019-01-26 NOTE — Patient Instructions (Signed)
Exercising to Stay Healthy To become healthy and stay healthy, it is recommended that you do moderate-intensity and vigorous-intensity exercise. You can tell that you are exercising at a moderate intensity if your heart starts beating faster and you start breathing faster but can still hold a conversation. You can tell that you are exercising at a vigorous intensity if you are breathing much harder and faster and cannot hold a conversation while exercising. Exercising regularly is important. It has many health benefits, such as:  Improving overall fitness, flexibility, and endurance.  Increasing bone density.  Helping with weight control.  Decreasing body fat.  Increasing muscle strength.  Reducing stress and tension.  Improving overall health. How often should I exercise? Choose an activity that you enjoy, and set realistic goals. Your health care provider can help you make an activity plan that works for you. Exercise regularly as told by your health care provider. This may include:  Doing strength training two times a week, such as: ? Lifting weights. ? Using resistance bands. ? Push-ups. ? Sit-ups. ? Yoga.  Doing a certain intensity of exercise for a given amount of time. Choose from these options: ? A total of 150 minutes of moderate-intensity exercise every week. ? A total of 75 minutes of vigorous-intensity exercise every week. ? A mix of moderate-intensity and vigorous-intensity exercise every week. Children, pregnant women, people who have not exercised regularly, people who are overweight, and older adults may need to talk with a health care provider about what activities are safe to do. If you have a medical condition, be sure to talk with your health care provider before you start a new exercise program. What are some exercise ideas? Moderate-intensity exercise ideas include:  Walking 1 mile (1.6 km) in about 15 minutes.  Biking.  Hiking.  Golfing.  Dancing.   Water aerobics. Vigorous-intensity exercise ideas include:  Walking 4.5 miles (7.2 km) or more in about 1 hour.  Jogging or running 5 miles (8 km) in about 1 hour.  Biking 10 miles (16.1 km) or more in about 1 hour.  Lap swimming.  Roller-skating or in-line skating.  Cross-country skiing.  Vigorous competitive sports, such as football, basketball, and soccer.  Jumping rope.  Aerobic dancing. What are some everyday activities that can help me to get exercise?  Yard work, such as: ? Pushing a lawn mower. ? Raking and bagging leaves.  Washing your car.  Pushing a stroller.  Shoveling snow.  Gardening.  Washing windows or floors. How can I be more active in my day-to-day activities?  Use stairs instead of an elevator.  Take a walk during your lunch break.  If you drive, park your car farther away from your work or school.  If you take public transportation, get off one stop early and walk the rest of the way.  Stand up or walk around during all of your indoor phone calls.  Get up, stretch, and walk around every 30 minutes throughout the day.  Enjoy exercise with a friend. Support to continue exercising will help you keep a regular routine of activity. What guidelines can I follow while exercising?  Before you start a new exercise program, talk with your health care provider.  Do not exercise so much that you hurt yourself, feel dizzy, or get very short of breath.  Wear comfortable clothes and wear shoes with good support.  Drink plenty of water while you exercise to prevent dehydration or heat stroke.  Work out until your breathing   and your heartbeat get faster. Where to find more information  [place] Department of Health and Human Services: www.hhs.gov  Centers for Disease Control and Prevention (CDC): www.cdc.gov Summary  Exercising regularly is important. It will improve your overall fitness, flexibility, and endurance.  Regular exercise also will  improve your overall health. It can help you control your weight, reduce stress, and improve your bone density.  Do not exercise so much that you hurt yourself, feel dizzy, or get very short of breath.  Before you start a new exercise program, talk with your health care provider. This information is not intended to replace advice given to you by your health care provider. Make sure you discuss any questions you have with your health care provider. Document Released: 07/12/2010 Document Revised: 05/22/2017 Document Reviewed: 04/30/2017 Elsevier Patient Education  2020 [employer]  

## 2019-01-26 NOTE — Progress Notes (Signed)
Subjective:     Patient ID: [name] [name] , female    DOB: July 18, 1950 , 68 y.o.   MRN: [id number]   Chief Complaint  Patient presents with  . Diabetes  . Hypertension    HPI  She is here today for diabetes check. She feels well with Trulicity. She has had no issues with the medication.  She notes that her sugars have improved since starting Trulicity. So much so, that she has stopped using her [place]. She was previously taking 34 units nightly.   Diabetes She presents for her follow-up diabetic visit. She has type 2 diabetes mellitus. Her disease course has been improving. There are no hypoglycemic associated symptoms. Pertinent negatives for diabetes include no blurred vision and no chest pain. There are no hypoglycemic complications. Risk factors for coronary artery disease include diabetes mellitus, dyslipidemia, hypertension and post-menopausal. Current diabetic treatment includes insulin injections. She is compliant with treatment most of the time. She is following a diabetic diet. She participates in exercise intermittently. Eye exam is current.  Hypertension This is a chronic problem. The current episode started more than 1 year ago. The problem has been gradually improving since onset. The problem is controlled. Pertinent negatives include no blurred vision, chest pain or palpitations. Past treatments include angiotensin blockers. The current treatment provides moderate improvement. Compliance problems include exercise and diet.  Hypertensive end-organ damage includes kidney disease. Identifiable causes of hypertension include chronic renal disease.     Past Medical History:  Diagnosis Date  . Anemia   . Carpal tunnel syndrome   . Coronary artery disease    DES to ostial RCA 2005  . Diabetes mellitus without complication ([place])    No longer taking meds  . Dyslipidemia   . Hypertension   . Osteoarthritis   . PAD (peripheral artery disease) (HCC)      Family History   Problem Relation Age of Onset  . CAD Father 11  . CAD Mother 51  . CAD Brother   . Cancer Brother   . CAD Sister   . CAD Brother      Current Outpatient Medications:  .  amLODipine (NORVASC) 10 MG tablet, TAKE 1 TABLET(10 MG) BY MOUTH DAILY, Disp: 90 tablet, Rfl: 1 .  aspirin 81 MG tablet, Take 81 mg by mouth daily., Disp: , Rfl:  .  BD PEN NEEDLE NANO U/F 32G X 4 MM MISC, USE TO INJECT INSULIN DAILY, Disp: 100 each, Rfl: 1 .  beta carotene 25000 UNIT capsule, Take 25,000 Units by mouth daily., Disp: , Rfl:  .  BIOTIN PO, Take 500 mcg by mouth daily. , Disp: , Rfl:  .  Cholecalciferol ([employer] VIT D 5000 HIGH-POTENCY PO), Take 1 tablet by mouth daily. , Disp: , Rfl:  .  CHROMIUM PICOLINATE PO, Take 200 mcg by mouth daily. , Disp: , Rfl:  .  Coenzyme Q10 (COQ-10) 100 MG CAPS, Take 1 tablet by mouth daily. , Disp: , Rfl:  .  Cyanocobalamin (VITAMIN B-12 PO), Take 500 mcg by mouth. , Disp: , Rfl:  .  Dulaglutide (TRULICITY) 7.34 KA/7.6OT SOPN, Inject 0.75 mg into the skin once a week., Disp: 4 pen, Rfl: 1 .  Flaxseed, Linseed, (FLAXSEED OIL) 1000 MG CAPS, Take 1 capsule by mouth daily. , Disp: , Rfl:  .  glucosamine-chondroitin 500-400 MG tablet, Take 1 tablet by mouth daily. Reported on 12/17/2015, Disp: , Rfl:  .  glucose blood (ONETOUCH VERIO) test strip, CHECK BLOOD SUGAR THREE TIMES DAILY  dx: e11.65, Disp: 150 each, Rfl: 11 .  Krill Oil 350 MG CAPS, Take 350 mg by mouth daily. , Disp: , Rfl:  .  magnesium oxide (MAG-OX) 400 MG tablet, Take 400 mg by mouth daily., Disp: , Rfl:  .  metoprolol tartrate (LOPRESSOR) 50 MG tablet, TAKE 1 TABLET BY MOUTH TWICE DAILY, Disp: 180 tablet, Rfl: 4 .  ONETOUCH DELICA LANCETS 66Z MISC, USE AS DIRECTED TO CHECK BLOOD SUGAR THREE TIMES DAILY, Disp: 100 each, Rfl: 0 .  pregabalin (LYRICA) 50 MG capsule, TAKE 1 CAPSULE(50 MG) BY MOUTH TWICE DAILY, Disp: 60 capsule, Rfl: 3 .  rosuvastatin (CRESTOR) 20 MG tablet, Take 1 tablet (20 mg total) by mouth daily.,  Disp: 90 tablet, Rfl: 3 .  traZODone (DESYREL) 50 MG tablet, TAKE 1 TABLET BY MOUTH EVERY NIGHT AT BEDTIME, Disp: 30 tablet, Rfl: 1 .  TRESIBA FLEXTOUCH 200 UNIT/ML SOPN, INJECT 34 UNITS UNDER THE SKIN EVERY NIGHT AT BEDTIME, MAX DOSE IS 80 UNITS (Patient taking differently: 34 Units. Patient does not take the [place] for BG <125), Disp: 9 mL, Rfl: 2 .  vitamin C (ASCORBIC ACID) 500 MG tablet, Take 1,000 mg by mouth 2 (two) times daily. , Disp: , Rfl:  .  vitamin E 200 UNIT capsule, Take 200 Units by mouth daily., Disp: , Rfl:    Allergies  Allergen Reactions  . Latex Rash     Review of Systems  Constitutional: Negative.   Eyes: Negative for blurred vision.  Respiratory: Negative.   Cardiovascular: Positive for leg swelling. Negative for chest pain and palpitations.       She c/o LE swelling. Goes down when she is seated. She denies using salt.  Gastrointestinal: Negative.   Neurological: Negative.   Psychiatric/Behavioral: Negative.      Today's Vitals   01/26/19 0854  BP: 116/74  Pulse: 81  Temp: 98.8 F (37.1 C)  TempSrc: Oral  Weight: 196 lb 6.4 oz (89.1 kg)  Height: 5' 4.5" (1.638 m)  PainSc: 0-No pain   Body mass index is 33.19 kg/m.   Objective:  Physical Exam Vitals signs and nursing note reviewed.  Constitutional:      Appearance: Normal appearance.  HENT:     Head: Normocephalic and atraumatic.  Cardiovascular:     Rate and Rhythm: Normal rate and regular rhythm.     Heart sounds: Normal heart sounds.  Pulmonary:     Effort: Pulmonary effort is normal.     Breath sounds: Normal breath sounds.  Musculoskeletal:     Right lower leg: 1+ Pitting Edema present.     Left lower leg: 1+ Pitting Edema present.  Skin:    General: Skin is warm.  Neurological:     General: No focal deficit present.     Mental Status: She is alert.  Psychiatric:        Mood and Affect: Mood normal.        Behavior: Behavior normal.         Assessment And Plan:     1.  Type 2 diabetes mellitus with stage 2 chronic kidney disease, with long-term current use of insulin ([place])  Diabetic foot exam was performed. Importance of dietary compliance was discussed with the patient. She will continue with Trulicity 9.93TT once weekly for now. Pt is aware that I will likely increase her dose to 1.15m once I have reviewed her labwork.   - CMP14+EGFR - Hemoglobin A1c - Lipid Profile  2. Essential hypertension, benign  Chronic, well controlled. She will continue with current meds.   3. Lower extremity edema  I will check BNP today. Pt advised that I may schedule her for 2d echocardiogram.   - Brain natriuretic peptide  4. Peripheral arterial disease (HCC)  Chronic, yet stable. She is encouraged to continue with her regular walking program. Encouraged to take statin as prescribed.   5. Hepatitis C carrier (HCC)  Chronic, yet stable.   6. Drug therapy  - Brain natriuretic peptide  7. Class 1 obesity due to excess calories with serious comorbidity and body mass index (BMI) of 33.0 to 33.9 in adult  Importance of achieving optimal weight to decrease risk of cardiovascular disease and cancers was discussed with the patient in full detail. She is encouraged to start slowly - start with 10 minutes twice daily at least three to four days per week and to gradually build to 30 minutes five days weekly. She was given tips to incorporate more activity into her daily routine - take stairs when possible, park farther away from grocery stores, etc.    8. Nocturia  Intermittent. Pt advised that this could be due to elevated blood sugars. Additionally, pt advised that this can be a symptom of untreated OSA. She agrees to Neuro referral for a sleep study.   - Ambulatory referral to Neurology         [name], MD    THE PATIENT IS ENCOURAGED TO PRACTICE SOCIAL DISTANCING DUE TO THE COVID-19 PANDEMIC.

## 2019-01-27 LAB — CMP14+EGFR
ALT: 11 IU/L (ref 0–32)
AST: 13 IU/L (ref 0–40)
Albumin/Globulin Ratio: 1.3 (ref 1.2–2.2)
Albumin: 4.3 g/dL (ref 3.8–4.8)
Alkaline Phosphatase: 134 IU/L — ABNORMAL HIGH (ref 39–117)
BUN/Creatinine Ratio: 14 (ref 12–28)
BUN: 14 mg/dL (ref 8–27)
Bilirubin Total: 0.3 mg/dL (ref 0.0–1.2)
CO2: 23 mmol/L (ref 20–29)
Calcium: 9.5 mg/dL (ref 8.7–10.3)
Chloride: 99 mmol/L (ref 96–106)
Creatinine, Ser: 1.02 mg/dL — ABNORMAL HIGH (ref 0.57–1.00)
GFR calc Af Amer: 65 mL/min/{1.73_m2} (ref >59)
GFR calc non Af Amer: 57 mL/min/{1.73_m2} — ABNORMAL LOW (ref >59)
Globulin, Total: 3.3 g/dL (ref 1.5–4.5)
Glucose: 164 mg/dL — ABNORMAL HIGH (ref 65–99)
Potassium: 4.1 mmol/L (ref 3.5–5.2)
Sodium: 142 mmol/L (ref 134–144)
Total Protein: 7.6 g/dL (ref 6.0–8.5)

## 2019-01-27 LAB — HEMOGLOBIN A1C
Est. average glucose Bld gHb Est-mCnc: 183 mg/dL
Hgb A1c MFr Bld: 8 % — ABNORMAL HIGH (ref 4.8–5.6)

## 2019-01-27 LAB — LIPID PANEL
Chol/HDL Ratio: 2.8 ratio (ref 0.0–4.4)
Cholesterol, Total: 150 mg/dL (ref 100–199)
HDL: 53 mg/dL (ref >39)
LDL Calculated: 81 mg/dL (ref 0–99)
Triglycerides: 80 mg/dL (ref 0–149)
VLDL Cholesterol Cal: 16 mg/dL (ref 5–40)

## 2019-01-27 LAB — BRAIN NATRIURETIC PEPTIDE: BNP: 32.9 pg/mL (ref 0.0–100.0)

## 2019-01-31 ENCOUNTER — Ambulatory Visit: Payer: Self-pay

## 2019-01-31 DIAGNOSIS — Z599 Problem related to housing and economic circumstances, unspecified: Secondary | ICD-10-CM

## 2019-01-31 DIAGNOSIS — N182 Chronic kidney disease, stage 2 (mild): Secondary | ICD-10-CM

## 2019-01-31 DIAGNOSIS — Z598 Other problems related to housing and economic circumstances: Secondary | ICD-10-CM

## 2019-01-31 DIAGNOSIS — E1122 Type 2 diabetes mellitus with diabetic chronic kidney disease: Secondary | ICD-10-CM

## 2019-01-31 DIAGNOSIS — I10 Essential (primary) hypertension: Secondary | ICD-10-CM

## 2019-01-31 NOTE — Chronic Care Management (AMB) (Signed)
  Chronic Care Management   Social Work Follow Up Note  01/31/2019 Name: [name] [name] MRN: [id number] DOB: Aug 30, 1950  [name] [name] is a 68 y.o. year old female who is a primary care patient of Glendale Chard, MD. The CCM team was consulted for assistance with [name] .   Review of patient status, including review of consultants reports, other relevant assessments, and collaboration with appropriate care team members and the patient's provider was performed as part of comprehensive patient evaluation and provision of chronic care management services.     Goals Addressed            This Visit's Progress     Patient Stated   . COMPLETED: "I get anxiety about my power bill" (pt-stated)       Current Barriers:  . Financial constraints  . Increased power bill since recent apartment renovation . [employer] Program assistance has already been used. Patient unaware of other resources available to assist  Clinical Social Work Clinical Goal(s):  Marland Kitchen Over the next 20 days, patient will work with SW to address concerns related to identifying community resources to assist with utility costs not met - re-established 5/13 completed 12/07/18 . Over the next 10 days, patient will follow up with [employer] to arrange an energy assessment as directed by SW completed . 12/07/18: Over the next 30 days patient will work with [employer] to determine eligibility for receiving financial assistance Not met- re-established 01/18/19  CCM SW Interventions: Completed 01/31/19  Outbound call to the patient to assess outcome of referral to [facility])  Determined the patient has yet to have contact regarding previously submission for financial assistance  Advised the patient CCM SW has also yet to receive communication from [facility] regarding patient referral  Reviewed barriers to receiving assistance surrounding so many residents who are applying for assistance  related to [place] pandemic  Educated the patient on a recent program for rent and utility assistance through [employer], advised the patient this program is for [employer] whom are behind on bills due to the pandemic. The patient stated "that's why I am behind"  Provided the patient with the contact number to [facility]   Encouraged the patient to contact this agency to perform a screening call to determine eligibility   Patient Self Care Activities:  . Self administers medications as prescribed . Attends all scheduled provider appointments . Calls pharmacy for medication refills . Attends church or other social activities . Performs ADL's independently  Please see past updates related to this goal by clicking on the "Past Updates" button in the selected goal          Follow Up Plan: No further follow up planned at this time. CCM SW has completed SW goal as all resources have been provided to the patient. The patient is encouraged to contact CCM SW for future resource needs.   Daneen Schick, BSW, CDP Social Worker, Certified Dementia Practitioner [place] [facility] [place] [facility] [phone]  Total time spent performing care coordination and/or care management activities with the patient by phone or face to face = 8 minutes.

## 2019-01-31 NOTE — Patient Instructions (Signed)
Social Worker Visit Information  Goals we discussed today:  Goals Addressed            This Visit's Progress     Patient Stated   . COMPLETED: "I get anxiety about my power bill" (pt-stated)       Current Barriers:  . Financial constraints  . Increased power bill since recent apartment renovation . [employer] Program assistance has already been used. Patient unaware of other resources available to assist  Clinical Social Work Clinical Goal(s):  Marland Kitchen Over the next 20 days, patient will work with SW to address concerns related to identifying community resources to assist with utility costs not met - re-established 5/13 completed 12/07/18 . Over the next 10 days, patient will follow up with [employer] to arrange an energy assessment as directed by SW completed . 12/07/18: Over the next 30 days patient will work with [employer] to determine eligibility for receiving financial assistance Not met- re-established 01/18/19  CCM SW Interventions: Completed 01/31/19  Outbound call to the patient to assess outcome of referral to [facility])  Determined the patient has yet to have contact regarding previously submission for financial assistance  Advised the patient CCM SW has also yet to receive communication from [facility] regarding patient referral  Reviewed barriers to receiving assistance surrounding so many residents who are applying for assistance related to [place] pandemic  Educated the patient on a recent program for rent and utility assistance through [employer], advised the patient this program is for [employer] whom are behind on bills due to the pandemic. The patient stated "that's why I am behind"  Provided the patient with the contact number to [facility]   Encouraged the patient to contact this agency to perform a screening call to determine eligibility   Patient Self Care Activities:  . Self  administers medications as prescribed . Attends all scheduled provider appointments . Calls pharmacy for medication refills . Attends church or other social activities . Performs ADL's independently  Please see past updates related to this goal by clicking on the "Past Updates" button in the selected goal            Follow Up Plan: No further SW follow up planned. Please contact me for future resource needs.  Daneen Schick, BSW, CDP Social Worker, Certified Dementia Practitioner [place] [facility] [place] [facility] [phone]

## 2019-02-01 ENCOUNTER — Ambulatory Visit (INDEPENDENT_AMBULATORY_CARE_PROVIDER_SITE_OTHER): Payer: Medicare Other | Admitting: Pharmacist

## 2019-02-01 ENCOUNTER — Telehealth: Payer: Self-pay

## 2019-02-01 DIAGNOSIS — E1122 Type 2 diabetes mellitus with diabetic chronic kidney disease: Secondary | ICD-10-CM

## 2019-02-01 DIAGNOSIS — I10 Essential (primary) hypertension: Secondary | ICD-10-CM

## 2019-02-01 DIAGNOSIS — Z794 Long term (current) use of insulin: Secondary | ICD-10-CM

## 2019-02-01 DIAGNOSIS — N182 Chronic kidney disease, stage 2 (mild): Secondary | ICD-10-CM

## 2019-02-03 NOTE — Progress Notes (Signed)
  Chronic Care Management   Visit Note  02/01/2019 Name: [name] [name] MRN: [id number] DOB: 1951/02/28  Referred by: Glendale Chard, MD Reason for referral : Chronic Care Management   [name] [name] is a 68 y.o. year old female who is a primary care patient of Glendale Chard, MD. The CCM team was consulted for assistance with chronic disease management and care coordination needs.   Review of patient status, including review of consultants reports, relevant laboratory and other test results, and collaboration with appropriate care team members and the patient's provider was performed as part of comprehensive patient evaluation and provision of chronic care management services.    I spoke with [name] [name] by telephone today.  Objective:   Goals Addressed            This Visit's Progress     Patient Stated   . I would like to reduce my A1c  (pt-stated)       Pharmacist Clinical Goal(s):  Marland Kitchen Over the next 90 days, patient will demonstrate Improved medication adherence as evidenced by daily medication adherence, FBG <130 and controlled BGs throughout the day: 02/03/19: Target Goal Date revised to 90 days due to treatment delays secondary to COVID-19  Current Barriers:  . Diabetes: T2DM; most recent A1c 8% on 01/26/2019 (decreased from 9.1% on 11/23/2018).  Patient states she is adhering to a better diet.  . Current antihyperglycemic regimen: Tresiba 10 units, Trulicity 0.75mg  . denies hypoglycemic symptoms; denies hyperglycemic symptoms . Current meal patterns: patient is now mindful of carbohydrates; choosing meat and vegetables over carbs o Encouraged patient to avoid juices/sugary drinks . Current exercise: walking . Current blood glucose readings: FBG<130 . Cardiovascular risk reduction: o Current hypertensive regimen: amlodipine, metoprolol BID o Current hyperlipidemia regimen:  Crestor 20mg  daily (last filled 90 DS on 11/16/18)  PharmD & CCM RN Interventions: . Comprehensive  medication review performed, medication list updated in the EMR.  Reviewed medication fill history via insurance claims data confirming patient appears compliant with having his medications filled on time as prescribed by provider. . Reviewed & discussed the following diabetes-related information with patient: o Continue checking blood sugars as directed o Follow ADA recommended "diabetes-friendly" diet  (reviewed healthy snack/food options) o Discussed insulin/GLP-1 injection technique; Patient uses One touch Verio glucometer o Reviewed medication purpose/side effects-->patient denies adverse events, denies hypoglycemia, reports FBG<130, most recently in the upper 90s. o Continue taking all medications as prescribed by provider  Patient Self Care Activities:  . Patient will check blood glucose daily , document, and provide at future appointments . Patient will focus on medication adherence by continuing to take medications as prescribed . Patient will take medications as prescribed . Patient will contact provider with any episodes of hypoglycemia . Patient will report any questions or concerns to provider    Please see past updates related to this goal by clicking on the "Past Updates" button in the selected goal          Plan:   The care management team will reach out to the patient again over the next 4 weeks.  Regina Eck, PharmD, BCPS Clinical Pharmacist, [facility] [place]: [phone]

## 2019-02-03 NOTE — Patient Instructions (Signed)
Visit Information  Goals Addressed            This Visit's Progress     Patient Stated   . I would like to reduce my A1c  (pt-stated)       Pharmacist Clinical Goal(s):  Marland Kitchen Over the next 90 days, patient will demonstrate Improved medication adherence as evidenced by daily medication adherence, FBG <130 and controlled BGs throughout the day: 02/03/19: Target Goal Date revised to 90 days due to treatment delays secondary to COVID-19  Current Barriers:  . Diabetes: T2DM; most recent A1c 8% on 01/26/2019 (decreased from 9.1% on 11/23/2018).  Patient states she is adhering to a better diet.  . Current antihyperglycemic regimen: Tresiba 10 units, Trulicity 0.75mg  . denies hypoglycemic symptoms; denies hyperglycemic symptoms . Current meal patterns: patient is now mindful of carbohydrates; choosing meat and vegetables over carbs o Encouraged patient to avoid juices/sugary drinks . Current exercise: walking . Current blood glucose readings: FBG<130 . Cardiovascular risk reduction: o Current hypertensive regimen: amlodipine, metoprolol BID o Current hyperlipidemia regimen:  Crestor 20mg  daily (patient states she is taking although fill history does not align)  PharmD & CCM RN Interventions: . Comprehensive medication review performed, medication list updated in the EMR.  Reviewed medication fill history via insurance claims data confirming patient appears compliant with having his medications filled on time as prescribed by provider. . Reviewed & discussed the following diabetes-related information with patient: o Continue checking blood sugars as directed o Follow ADA recommended "diabetes-friendly" diet  (reviewed healthy snack/food options) o Discussed insulin/GLP-1 injection technique; Patient uses One touch Verio glucometer o Reviewed medication purpose/side effects-->patient denies adverse events, denies hypoglycemia, reports FBG<130, most recently in the upper 90s. o Continue taking all  medications as prescribed by provider  Patient Self Care Activities:  . Patient will check blood glucose daily , document, and provide at future appointments . Patient will focus on medication adherence by continuing to take medications as prescribed . Patient will take medications as prescribed . Patient will contact provider with any episodes of hypoglycemia . Patient will report any questions or concerns to provider    Please see past updates related to this goal by clicking on the "Past Updates" button in the selected goal         The patient verbalized understanding of instructions provided today and declined a print copy of patient instruction materials.   The care management team will reach out to the patient again over the next 4 weeks.  Regina Eck, PharmD, BCPS Clinical Pharmacist, [facility] [place]: [phone]

## 2019-02-13 ENCOUNTER — Other Ambulatory Visit: Payer: Self-pay | Admitting: Internal Medicine

## 2019-02-17 ENCOUNTER — Encounter: Payer: Self-pay | Admitting: Neurology

## 2019-02-17 ENCOUNTER — Other Ambulatory Visit: Payer: Self-pay

## 2019-02-17 ENCOUNTER — Ambulatory Visit (INDEPENDENT_AMBULATORY_CARE_PROVIDER_SITE_OTHER): Payer: Medicare Other | Admitting: Neurology

## 2019-02-17 VITALS — BP 127/80 | HR 72 | Temp 96.9°F | Resp 20 | Ht 66.0 in | Wt 196.5 lb

## 2019-02-17 DIAGNOSIS — M25471 Effusion, right ankle: Secondary | ICD-10-CM

## 2019-02-17 DIAGNOSIS — I739 Peripheral vascular disease, unspecified: Secondary | ICD-10-CM

## 2019-02-17 DIAGNOSIS — R0609 Other forms of dyspnea: Secondary | ICD-10-CM | POA: Diagnosis not present

## 2019-02-17 DIAGNOSIS — I1310 Hypertensive heart and chronic kidney disease without heart failure, with stage 1 through stage 4 chronic kidney disease, or unspecified chronic kidney disease: Secondary | ICD-10-CM | POA: Diagnosis not present

## 2019-02-17 DIAGNOSIS — B182 Chronic viral hepatitis C: Secondary | ICD-10-CM

## 2019-02-17 DIAGNOSIS — R0683 Snoring: Secondary | ICD-10-CM | POA: Diagnosis not present

## 2019-02-17 DIAGNOSIS — I25119 Atherosclerotic heart disease of native coronary artery with unspecified angina pectoris: Secondary | ICD-10-CM

## 2019-02-17 DIAGNOSIS — N182 Chronic kidney disease, stage 2 (mild): Secondary | ICD-10-CM

## 2019-02-17 DIAGNOSIS — M25472 Effusion, left ankle: Secondary | ICD-10-CM

## 2019-02-17 DIAGNOSIS — Z794 Long term (current) use of insulin: Secondary | ICD-10-CM

## 2019-02-17 DIAGNOSIS — E1122 Type 2 diabetes mellitus with diabetic chronic kidney disease: Secondary | ICD-10-CM
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
## 2019-02-17 NOTE — Patient Instructions (Signed)

## 2019-03-01 ENCOUNTER — Other Ambulatory Visit: Payer: Self-pay

## 2019-03-01 MED ORDER — TRULICITY 0.75 MG/0.5ML ~~LOC~~ SOAJ: 0.7500 mg | SUBCUTANEOUS | 1 refills | Status: DC

## 2019-03-06 ENCOUNTER — Ambulatory Visit (INDEPENDENT_AMBULATORY_CARE_PROVIDER_SITE_OTHER): Payer: Medicare Other | Admitting: Neurology

## 2019-03-06 DIAGNOSIS — I1310 Hypertensive heart and chronic kidney disease without heart failure, with stage 1 through stage 4 chronic kidney disease, or unspecified chronic kidney disease: Secondary | ICD-10-CM

## 2019-03-06 DIAGNOSIS — G4733 Obstructive sleep apnea (adult) (pediatric): Secondary | ICD-10-CM

## 2019-03-06 DIAGNOSIS — J449 Chronic obstructive pulmonary disease, unspecified: Secondary | ICD-10-CM

## 2019-03-06 DIAGNOSIS — E1122 Type 2 diabetes mellitus with diabetic chronic kidney disease: Secondary | ICD-10-CM

## 2019-03-06 DIAGNOSIS — I25119 Atherosclerotic heart disease of native coronary artery with unspecified angina pectoris: Secondary | ICD-10-CM

## 2019-03-06 DIAGNOSIS — M25472 Effusion, left ankle: Secondary | ICD-10-CM

## 2019-03-06 DIAGNOSIS — I739 Peripheral vascular disease, unspecified: Secondary | ICD-10-CM

## 2019-03-06 DIAGNOSIS — R0683 Snoring: Secondary | ICD-10-CM

## 2019-03-06 DIAGNOSIS — M25471 Effusion, right ankle: Secondary | ICD-10-CM

## 2019-03-06 DIAGNOSIS — R0609 Other forms of dyspnea: Secondary | ICD-10-CM

## 2019-03-07 ENCOUNTER — Telehealth: Payer: Self-pay

## 2019-03-14 ENCOUNTER — Telehealth: Payer: Self-pay | Admitting: Neurology

## 2019-03-14 DIAGNOSIS — G4733 Obstructive sleep apnea (adult) (pediatric): Secondary | ICD-10-CM | POA: Insufficient documentation

## 2019-03-14 DIAGNOSIS — J449 Chronic obstructive pulmonary disease, unspecified: Secondary | ICD-10-CM | POA: Insufficient documentation

## 2019-03-14 NOTE — Telephone Encounter (Signed)
-----   Message from Larey Seat, MD sent at 03/14/2019  9:05 AM EDT ----- The Wake baseline 02 saturation was  96%, with the lowest being 62%. Time spent below 89% saturation  equaled 178 minutes.  IMPRESSION:   1. Moderate Obstructive Sleep Apnea(OSA)  2. Snoring.  3. REM sleep accentuated hypoxemia.    RECOMMENDATIONS:   1. Advise full-night, attended, CPAP titration study to optimize  therapy.

## 2019-03-14 NOTE — Telephone Encounter (Signed)
I called pt. I advised pt that Dr. Dohmeier reviewed their sleep study results and found that moderate sleep apnea and recommends that pt be treated with a cpap. Dr. Dohmeier recommends that pt return for a repeat sleep study in order to properly titrate the cpap and ensure a good mask fit. Pt is agreeable to returning for a titration study. I advised pt that our sleep lab will file with pt's insurance and call pt to schedule the sleep study when we hear back from the pt's insurance regarding coverage of this sleep study. Pt verbalized understanding of results. Pt had no questions at this time but was encouraged to call back if questions arise.  

## 2019-03-14 NOTE — Addendum Note (Signed)
Addended by: Larey Seat on: 03/14/2019 09:06 AM   Modules accepted: Orders

## 2019-03-14 NOTE — Procedures (Signed)
PATIENT'S NAME:  [name] [name], [name] [name] DOB:      December 31, 1950      MR#:    [id number]     DATE OF RECORDING: 03/06/2019 [facility] REFERRING M.D.:  Glendale Chard MD Study Performed:   Baseline Polysomnogram HISTORY:  [name] [name] is a 68 year old African American female patient seen on 02/17/2019. Referral by Dr. Baird Cancer, MD.  Chief concern according to patient: "I don't sleep well and my legs swell". She also feels troubled by inability to initiate sleep. A medical history of : Anemia, Carpal tunnel syndrome, Coronary artery disease, Diabetes mellitus without complication ([place]), Dyslipidemia, Hypertension, Osteoarthritis, and PAD (peripheral artery disease) ([place]). Family medical /sleep history: brother had OSA, and used to sleep walk.   She reports not feeling refreshed or restored in AM, with symptoms such as dry mouth, morning headaches and some residual fatigue. Naps are taken frequently in front of the TV- or while reading- lasting from 30 to 45 minutes and are refreshing.   The patient endorsed the Epworth Sleepiness Scale at 12/24 points.   The patient's weight 196 pounds with a height of 66 (inches), resulting in a BMI of 31.5 kg/m2. The patient's neck circumference measured 17 inches.  CURRENT MEDICATIONS: Norvasc, Aspirin, Biotin, Cholecalciferol, COQ-10, Vitamin V76, Trulicity, Mag-ox, Lyrica, Crestor, Desyrel, Vitamin C,   PROCEDURE:  This is a multichannel digital polysomnogram utilizing the Somnostar 11.2 system.  Electrodes and sensors were applied and monitored per AASM Specifications.   EEG, EOG, Chin and Limb EMG, were sampled at 200 Hz.  ECG, Snore and Nasal Pressure, Thermal Airflow, Respiratory Effort, CPAP Flow and Pressure, Oximetry was sampled at 50 Hz. Digital video and audio were recorded.      BASELINE STUDY  Lights Out was at 21:17 and Lights On at 04:56.  Total recording time (TRT) was 459.5 minutes, with a total sleep time (TST) of 397.5 minutes.   The patient's sleep latency was 41  minutes.  REM latency was 116 minutes.  The sleep efficiency was 86.5 %.     SLEEP ARCHITECTURE: WASO (Wake after sleep onset) was 22.5 minutes.  There were 2.5 minutes in Stage N1, 193 minutes Stage N2, 91 minutes Stage N3 and 111 minutes in Stage REM.  The percentage of Stage N1 was .6%, Stage N2 was 48.6%, Stage N3 was 22.9% and Stage R (REM sleep) was 27.9%.    RESPIRATORY ANALYSIS:  There were a total of 133 respiratory events:  39 obstructive apneas, 2 central apneas and 0 mixed apneas with a total of 41 apneas and an apnea index (AI) of 6.2 /hour. There were 92 hypopneas with a hypopnea index of 13.9 /hour.    The total APNEA/HYPOPNEA INDEX (AHI) was 20.1/hour.  111 events occurred in REM sleep and 42 events in NREM. The REM AHI was 60.0 /hour, versus a non-REM AHI of 4.6. The patient spent 230 minutes of total sleep time in the supine position and 168 minutes in non-supine.. The supine AHI was 17.8 versus a non-supine AHI of 23.3.  OXYGEN SATURATION & C02:  The Wake baseline 02 saturation was 96%, with the lowest being 62%. Time spent below 89% saturation equaled 178 minutes.  The arousals were noted as: 29 were spontaneous, 0 were associated with PLMs, and 13 were associated with respiratory events. The patient had 0 Periodic Limb Movements.    Audio and video analysis did not show any abnormal or unusual movements, behaviors, phonations or vocalizations. Snoring was noted. EKG was in keeping with normal  sinus rhythm (NSR).   IMPRESSION:  1. Moderate Obstructive Sleep Apnea(OSA) 2. Snoring. 3. REM sleep accentuated hypoxemia.    RECOMMENDATIONS:  1. Advise full-night, attended, CPAP titration study to optimize therapy.     I certify that I have reviewed the entire raw data recording prior to the issuance of this report in accordance with the Standards of Accreditation of the American Academy of Sleep Medicine (AASM)   Melvyn Novas, MD   03-10-2019 Diplomat, American Board  of Psychiatry and Neurology  Diplomat, American Board of Sleep Medicine Medical Director, [place] [facility] [employer]

## 2019-03-17 ENCOUNTER — Telehealth: Payer: Self-pay

## 2019-03-23 ENCOUNTER — Other Ambulatory Visit: Payer: Self-pay

## 2019-03-23 ENCOUNTER — Encounter: Payer: Medicare Other | Admitting: Internal Medicine

## 2019-03-23 ENCOUNTER — Encounter: Payer: Self-pay | Admitting: Internal Medicine

## 2019-03-23 ENCOUNTER — Ambulatory Visit: Payer: Medicare Other

## 2019-03-23 ENCOUNTER — Ambulatory Visit (INDEPENDENT_AMBULATORY_CARE_PROVIDER_SITE_OTHER): Payer: Medicare Other | Admitting: Internal Medicine

## 2019-03-23 VITALS — BP 128/74 | HR 63 | Temp 97.6°F | Ht 63.4 in | Wt 192.4 lb

## 2019-03-23 DIAGNOSIS — I129 Hypertensive chronic kidney disease with stage 1 through stage 4 chronic kidney disease, or unspecified chronic kidney disease: Secondary | ICD-10-CM | POA: Diagnosis not present

## 2019-03-23 DIAGNOSIS — L304 Erythema intertrigo: Secondary | ICD-10-CM | POA: Diagnosis not present

## 2019-03-23 DIAGNOSIS — R0609 Other forms of dyspnea: Secondary | ICD-10-CM | POA: Diagnosis not present

## 2019-03-23 DIAGNOSIS — Z6833 Body mass index (BMI) 33.0-33.9, adult: Secondary | ICD-10-CM

## 2019-03-23 DIAGNOSIS — N182 Chronic kidney disease, stage 2 (mild): Secondary | ICD-10-CM | POA: Diagnosis not present

## 2019-03-23 DIAGNOSIS — M25511 Pain in right shoulder: Secondary | ICD-10-CM | POA: Diagnosis not present

## 2019-03-23 DIAGNOSIS — E6609 Other obesity due to excess calories: Secondary | ICD-10-CM

## 2019-03-23 DIAGNOSIS — G8929 Other chronic pain: Secondary | ICD-10-CM

## 2019-03-23 DIAGNOSIS — G4733 Obstructive sleep apnea (adult) (pediatric): Secondary | ICD-10-CM | POA: Diagnosis not present

## 2019-03-23 DIAGNOSIS — Z0000 Encounter for general adult medical examination without abnormal findings: Secondary | ICD-10-CM | POA: Diagnosis not present

## 2019-03-23 DIAGNOSIS — E1122 Type 2 diabetes mellitus with diabetic chronic kidney disease: Secondary | ICD-10-CM

## 2019-03-23 DIAGNOSIS — Z794 Long term (current) use of insulin: Secondary | ICD-10-CM | POA: Diagnosis not present

## 2019-03-23 DIAGNOSIS — E559 Vitamin D deficiency, unspecified: Secondary | ICD-10-CM | POA: Diagnosis not present

## 2019-03-23 LAB — CBC
Hematocrit: 41.1 % (ref 34.0–46.6)
Hemoglobin: 12.7 g/dL (ref 11.1–15.9)
MCH: 22.5 pg — ABNORMAL LOW (ref 26.6–33.0)
MCHC: 30.9 g/dL — ABNORMAL LOW (ref 31.5–35.7)
MCV: 73 fL — ABNORMAL LOW (ref 79–97)
Platelets: 265 10*3/uL (ref 150–450)
RBC: 5.65 x10E6/uL — ABNORMAL HIGH (ref 3.77–5.28)
RDW: 16.2 % — ABNORMAL HIGH (ref 11.7–15.4)
WBC: 6.5 10*3/uL (ref 3.4–10.8)

## 2019-03-23 LAB — POCT URINALYSIS DIPSTICK
Bilirubin, UA: NEGATIVE
Blood, UA: NEGATIVE
Glucose, UA: NEGATIVE
Ketones, UA: NEGATIVE
Nitrite, UA: NEGATIVE
Protein, UA: NEGATIVE
Spec Grav, UA: 1.02 (ref 1.010–1.025)
Urobilinogen, UA: 0.2 E.U./dL
pH, UA: 7 (ref 5.0–8.0)

## 2019-03-23 LAB — POCT UA - MICROALBUMIN
Albumin/Creatinine Ratio, Urine, POC: <30
Creatinine, POC: 100 mg/dL
Microalbumin Ur, POC: 30 mg/L

## 2019-03-23 MED ORDER — NYSTATIN 100000 UNIT/GM EX POWD: Freq: Three times a day (TID) | CUTANEOUS | 0 refills | Status: DC

## 2019-03-23 NOTE — Patient Instructions (Addendum)
Please increase Tresiba 25 units nightly And increase Trulicity 1.5mg  weekly   Health Maintenance, Female Adopting a healthy lifestyle and getting preventive care are important in promoting health and wellness. Ask your health care provider about:  The right schedule for you to have regular tests and exams.  Things you can do on your own to prevent diseases and keep yourself healthy. What should I know about diet, weight, and exercise? Eat a healthy diet   Eat a diet that includes plenty of vegetables, fruits, low-fat dairy products, and lean protein.  Do not eat a lot of foods that are high in solid fats, added sugars, or sodium. Maintain a healthy weight Body mass index (BMI) is used to identify weight problems. It estimates body fat based on height and weight. Your health care provider can help determine your BMI and help you achieve or maintain a healthy weight. Get regular exercise Get regular exercise. This is one of the most important things you can do for your health. Most adults should:  Exercise for at least 150 minutes each week. The exercise should increase your heart rate and make you sweat (moderate-intensity exercise).  Do strengthening exercises at least twice a week. This is in addition to the moderate-intensity exercise.  Spend less time sitting. Even light physical activity can be beneficial. Watch cholesterol and blood lipids Have your blood tested for lipids and cholesterol at 68 years of age, then have this test every 5 years. Have your cholesterol levels checked more often if:  Your lipid or cholesterol levels are high.  You are older than 68 years of age.  You are at high risk for heart disease. What should I know about cancer screening? Depending on your health history and family history, you may need to have cancer screening at various ages. This may include screening for:  Breast cancer.  Cervical cancer.  Colorectal cancer.  Skin cancer.   Lung cancer. What should I know about heart disease, diabetes, and high blood pressure? Blood pressure and heart disease  High blood pressure causes heart disease and increases the risk of stroke. This is more likely to develop in people who have high blood pressure readings, are of African descent, or are overweight.  Have your blood pressure checked: ? Every 3-5 years if you are 34-42 years of age. ? Every year if you are 30 years old or older. Diabetes Have regular diabetes screenings. This checks your fasting blood sugar level. Have the screening done:  Once every three years after age 29 if you are at a normal weight and have a low risk for diabetes.  More often and at a younger age if you are overweight or have a high risk for diabetes. What should I know about preventing infection? Hepatitis B If you have a higher risk for hepatitis B, you should be screened for this virus. Talk with your health care provider to find out if you are at risk for hepatitis B infection. Hepatitis C Testing is recommended for:  Everyone born from [birth year] through 1965.  Anyone with known risk factors for hepatitis C. Sexually transmitted infections (STIs)  Get screened for STIs, including gonorrhea and chlamydia, if: ? You are sexually active and are younger than 68 years of age. ? You are older than 68 years of age and your health care provider tells you that you are at risk for this type of infection. ? Your sexual activity has changed since you were last screened, and you are  at increased risk for chlamydia or gonorrhea. Ask your health care provider if you are at risk.  Ask your health care provider about whether you are at high risk for HIV. Your health care provider may recommend a prescription medicine to help prevent HIV infection. If you choose to take medicine to prevent HIV, you should first get tested for HIV. You should then be tested every 3 months for as long as you are taking the  medicine. Pregnancy  If you are about to stop having your period (premenopausal) and you may become pregnant, seek counseling before you get pregnant.  Take 400 to 800 micrograms (mcg) of folic acid every day if you become pregnant.  Ask for birth control (contraception) if you want to prevent pregnancy. Osteoporosis and menopause Osteoporosis is a disease in which the bones lose minerals and strength with aging. This can result in bone fractures. If you are 45 years old or older, or if you are at risk for osteoporosis and fractures, ask your health care provider if you should:  Be screened for bone loss.  Take a calcium or vitamin D supplement to lower your risk of fractures.  Be given hormone replacement therapy (HRT) to treat symptoms of menopause. Follow these instructions at home: Lifestyle  Do not use any products that contain nicotine or tobacco, such as cigarettes, e-cigarettes, and chewing tobacco. If you need help quitting, ask your health care provider.  Do not use street drugs.  Do not share needles.  Ask your health care provider for help if you need support or information about quitting drugs. Alcohol use  Do not drink alcohol if: ? Your health care provider tells you not to drink. ? You are pregnant, may be pregnant, or are planning to become pregnant.  If you drink alcohol: ? Limit how much you use to 0-1 drink a day. ? Limit intake if you are breastfeeding.  Be aware of how much alcohol is in your drink. In the [place] one drink equals one 12 oz bottle of beer (355 mL), one 5 oz glass of wine (148 mL), or one 1 oz glass of hard liquor (44 mL). General instructions  Schedule regular health, dental, and eye exams.  Stay current with your vaccines.  Tell your health care provider if: ? You often feel depressed. ? You have ever been abused or do not feel safe at home. Summary  Adopting a healthy lifestyle and getting preventive care are important in  promoting health and wellness.  Follow your health care provider's instructions about healthy diet, exercising, and getting tested or screened for diseases.  Follow your health care provider's instructions on monitoring your cholesterol and blood pressure. This information is not intended to replace advice given to you by your health care provider. Make sure you discuss any questions you have with your health care provider. Document Released: 12/23/2010 Document Revised: 06/02/2018 Document Reviewed: 06/02/2018 Elsevier Patient Education  2020 [employer].

## 2019-03-23 NOTE — Progress Notes (Signed)
Subjective:     Patient ID: [name] [name] , female    DOB: October 09, 1950 , 68 y.o.   MRN: [id number]   Chief Complaint  Patient presents with  . Annual Exam  . Diabetes  . Hypertension    HPI  She is here today for a full physical examination.  She is no longer followed by GYN.   Diabetes She presents for her follow-up diabetic visit. She has type 2 diabetes mellitus. There are no hypoglycemic associated symptoms. Pertinent negatives for diabetes include no blurred vision and no chest pain. There are no hypoglycemic complications. Diabetic complications include nephropathy. Risk factors for coronary artery disease include diabetes mellitus, dyslipidemia, obesity, post-menopausal, sedentary lifestyle and hypertension. She is compliant with treatment most of the time. Eye exam is current.  Hypertension This is a chronic problem. The current episode started more than 1 year ago. The problem has been gradually improving since onset. The problem is controlled. Associated symptoms include shortness of breath. Pertinent negatives include no blurred vision, chest pain, orthopnea or palpitations.     Past Medical History:  Diagnosis Date  . Anemia   . Carpal tunnel syndrome   . Coronary artery disease    DES to ostial RCA 2005  . Diabetes mellitus without complication ([place])    No longer taking meds  . Dyslipidemia   . Hypertension   . Osteoarthritis   . PAD (peripheral artery disease) ([facility])      Family History  Problem Relation Age of Onset  . CAD Father 63  . Heart attack Father   . CAD Mother 34  . Heart attack Mother   . CAD Brother   . Cancer Brother        prostate  . CAD Sister   . Cancer Sister        breast  . CAD Brother      Current Outpatient Medications:  .  amLODipine (NORVASC) 10 MG tablet, TAKE 1 TABLET(10 MG) BY MOUTH DAILY, Disp: 90 tablet, Rfl: 1 .  aspirin 81 MG tablet, Take 81 mg by mouth daily., Disp: , Rfl:  .  BD PEN NEEDLE NANO U/F 32G X 4 MM MISC,  USE TO INJECT INSULIN DAILY, Disp: 100 each, Rfl: 1 .  beta carotene 25000 UNIT capsule, Take 25,000 Units by mouth daily., Disp: , Rfl:  .  BIOTIN PO, Take 500 mcg by mouth daily. , Disp: , Rfl:  .  Cholecalciferol ([employer] VIT D 5000 HIGH-POTENCY PO), Take 1 tablet by mouth daily. , Disp: , Rfl:  .  CHROMIUM PICOLINATE PO, Take 200 mcg by mouth daily. , Disp: , Rfl:  .  Coenzyme Q10 (COQ-10) 100 MG CAPS, Take 1 tablet by mouth daily. , Disp: , Rfl:  .  Cyanocobalamin (VITAMIN B-12 PO), Take 500 mcg by mouth. , Disp: , Rfl:  .  Dulaglutide (TRULICITY) 2.68 TM/1.9QQ SOPN, Inject 0.75 mg into the skin once a week., Disp: 4 pen, Rfl: 1 .  Flaxseed, Linseed, (FLAXSEED OIL) 1000 MG CAPS, Take 1 capsule by mouth daily. , Disp: , Rfl:  .  glucosamine-chondroitin 500-400 MG tablet, Take 1 tablet by mouth daily. Reported on 12/17/2015, Disp: , Rfl:  .  glucose blood (ONETOUCH VERIO) test strip, CHECK BLOOD SUGAR THREE TIMES DAILY dx: e11.65, Disp: 150 each, Rfl: 11 .  Krill Oil 350 MG CAPS, Take 350 mg by mouth daily. , Disp: , Rfl:  .  magnesium oxide (MAG-OX) 400 MG tablet, Take 400 mg by  mouth daily., Disp: , Rfl:  .  metoprolol tartrate (LOPRESSOR) 50 MG tablet, TAKE 1 TABLET BY MOUTH TWICE DAILY, Disp: 180 tablet, Rfl: 4 .  ONETOUCH DELICA LANCETS 16X MISC, USE AS DIRECTED TO CHECK BLOOD SUGAR THREE TIMES DAILY, Disp: 100 each, Rfl: 0 .  pregabalin (LYRICA) 50 MG capsule, TAKE 1 CAPSULE(50 MG) BY MOUTH TWICE DAILY, Disp: 60 capsule, Rfl: 3 .  rosuvastatin (CRESTOR) 10 MG tablet, TAKE 1 TABLET BY MOUTH DAILY, Disp: 90 tablet, Rfl: 0 .  rosuvastatin (CRESTOR) 20 MG tablet, Take 1 tablet (20 mg total) by mouth daily., Disp: 90 tablet, Rfl: 3 .  traZODone (DESYREL) 50 MG tablet, TAKE 1 TABLET BY MOUTH EVERY NIGHT AT BEDTIME, Disp: 30 tablet, Rfl: 1 .  TRESIBA FLEXTOUCH 200 UNIT/ML SOPN, INJECT 34 UNITS UNDER THE SKIN EVERY NIGHT AT BEDTIME, MAX DOSE IS 80 UNITS (Patient taking differently: 10 Units. Patient  does not take the [place] for BG <125), Disp: 9 mL, Rfl: 2 .  vitamin C (ASCORBIC ACID) 500 MG tablet, Take 1,000 mg by mouth 2 (two) times daily. , Disp: , Rfl:  .  vitamin E 200 UNIT capsule, Take 200 Units by mouth daily., Disp: , Rfl:    Allergies  Allergen Reactions  . [place]  . Latex Rash     The patient states she uses post menopausal status for birth control. Last LMP was No LMP recorded. Patient is postmenopausal.. Negative for Dysmenorrhea Negative for: breast discharge, breast lump(s), breast pain and breast self exam. Associated symptoms include abnormal vaginal bleeding. Pertinent negatives include abnormal bleeding (hematology), anxiety, decreased libido, depression, difficulty falling sleep, dyspareunia, history of infertility, nocturia, sexual dysfunction, sleep disturbances, urinary incontinence, urinary urgency, vaginal discharge and vaginal itching. Diet regular.The patient states her exercise level is  intermittent.   . The patient's tobacco use is:  Social History   Tobacco Use  Smoking Status Former Smoker  . Packs/day: 0.50  . Years: 30.00  . Pack years: 15.00  . Types: Cigarettes  . Quit date: 12/20/2013  . Years since quitting: 5.3  Smokeless Tobacco Never Used  . She has been exposed to passive smoke. The patient's alcohol use is:  Social History   Substance and Sexual Activity  Alcohol Use Yes   Comment: rarely    Review of Systems  Constitutional: Negative.   HENT: Negative.   Eyes: Negative.  Negative for blurred vision.  Respiratory: Positive for shortness of breath.        She c/o occasional SOB w/ exertion. She does not feel SOB at rest. She denies palpitations and chest pains. She admits that she does not exercise as much as she should. Has occasional lower extremity edema.   Cardiovascular: Negative.  Negative for chest pain, palpitations and orthopnea.  Endocrine: Negative.   Genitourinary: Negative.   Musculoskeletal: Positive  for arthralgias.       She c/o r shoulder pain.  She denies fall/trauma. There is pain with movement. She has not tried any OTC meds for relief. Sometimes has pain with lifting arm over head.   Skin: Negative.   Allergic/Immunologic: Negative.   Neurological: Negative.   Hematological: Negative.   Psychiatric/Behavioral: Negative.      Today's Vitals   03/23/19 0939  BP: 128/74  Pulse: 63  Temp: 97.6 F (36.4 C)  TempSrc: Oral  SpO2: 98%  Weight: 192 lb 6.4 oz (87.3 kg)  Height: 5' 3.4" (1.61 m)   Body  mass index is 33.65 kg/m.   Objective:  Physical Exam Vitals signs and nursing note reviewed.  Constitutional:      Appearance: Normal appearance. She is obese.  HENT:     Head: Normocephalic and atraumatic.     Right Ear: Tympanic membrane, ear canal and external ear normal.     Left Ear: Tympanic membrane, ear canal and external ear normal.     Nose: Nose normal.     Mouth/Throat:     Mouth: Mucous membranes are moist.     Pharynx: Oropharynx is clear.  Eyes:     Extraocular Movements: Extraocular movements intact.     Conjunctiva/sclera: Conjunctivae normal.     Pupils: Pupils are equal, round, and reactive to light.  Neck:     Musculoskeletal: Normal range of motion and neck supple.  Cardiovascular:     Rate and Rhythm: Normal rate and regular rhythm.     Pulses:          Dorsalis pedis pulses are 2+ on the right side and 2+ on the left side.     Heart sounds: Normal heart sounds.  Pulmonary:     Effort: Pulmonary effort is normal.     Breath sounds: Normal breath sounds.  Chest:     Breasts: Tanner Score is 5.        Right: Normal.        Left: Normal.  Abdominal:     General: Abdomen is flat. Bowel sounds are normal.     Palpations: Abdomen is soft.  Genitourinary:    Comments: deferred Musculoskeletal: Normal range of motion.     Right shoulder: She exhibits tenderness.  Feet:     Right foot:     Protective Sensation: 5 sites tested. 5 sites sensed.      Skin integrity: Callus and dry skin present.     Toenail Condition: Right toenails are normal.     Left foot:     Protective Sensation: 5 sites tested. 5 sites sensed.     Skin integrity: Callus and dry skin present.     Toenail Condition: Left toenails are normal.  Skin:    General: Skin is warm and dry.     Findings: Rash present. Rash is macular.     Comments: Hyperpigmented, erythematous area underneath both breasts. No vesicular lesions noted .  Neurological:     General: No focal deficit present.     Mental Status: She is alert and oriented to person, place, and time.  Psychiatric:        Mood and Affect: Mood normal.        Behavior: Behavior normal.         Assessment And Plan:     1. Routine general medical examination at health care facility  A full exam was performed.  Importance of monthly self breast exams was discussed with the patient.  PATIENT HAS BEEN ADVISED TO GET 30-45 MINUTES REGULAR EXERCISE NO LESS THAN FOUR TO FIVE DAYS PER WEEK - BOTH WEIGHTBEARING EXERCISES AND AEROBIC ARE RECOMMENDED.  SHE WAS ADVISED TO FOLLOW A HEALTHY DIET WITH AT LEAST SIX FRUITS/VEGGIES PER DAY, DECREASE INTAKE OF RED MEAT, AND TO INCREASE FISH INTAKE TO TWO DAYS PER WEEK.  MEATS/FISH SHOULD NOT BE FRIED, BAKED OR BROILED IS PREFERABLE.  I SUGGEST WEARING SPF 50 SUNSCREEN ON EXPOSED PARTS AND ESPECIALLY WHEN IN THE DIRECT SUNLIGHT FOR AN EXTENDED PERIOD OF TIME.  PLEASE AVOID FAST FOOD RESTAURANTS AND INCREASE YOUR WATER  INTAKE.  - POCT Urinalysis Dipstick (81002) - POCT UA - Microalbumin  2. Type 2 diabetes mellitus with stage 2 chronic kidney disease, with long-term current use of insulin ([place])  Diabetic foot exam was performed.  I DISCUSSED WITH THE PATIENT AT LENGTH REGARDING THE GOALS OF GLYCEMIC CONTROL AND POSSIBLE LONG-TERM COMPLICATIONS.  I  ALSO STRESSED THE IMPORTANCE OF COMPLIANCE WITH HOME GLUCOSE MONITORING, DIETARY RESTRICTIONS INCLUDING AVOIDANCE OF SUGARY  DRINKS/PROCESSED FOODS,  ALONG WITH REGULAR EXERCISE.  I  ALSO STRESSED THE IMPORTANCE OF ANNUAL EYE EXAMS, SELF FOOT CARE AND COMPLIANCE WITH OFFICE VISITS.  - Referral to Chronic Care Management Services - BMP8+EGFR - CBC no Diff - POCT Urinalysis Dipstick (81002) - POCT UA - Microalbumin  3. Parenchymal renal hypertension, stage 1 through stage 4 or unspecified chronic kidney disease  Chronic, well controlled. She will continue with current meds. She is encouraged to avoid adding salt to her foods. EKG performed, no acute changes noted.  She will rto in six months for re-evaluation.   - EKG 12-Lead - Referral to Chronic Care Management Services - POCT Urinalysis Dipstick (81002) - POCT UA - Microalbumin  4. OSA (obstructive sleep apnea)  Chronic. She reports compliance with CPAP. She has noticed benefit with using CPAP.   5. Chronic right shoulder pain  She is advised to apply topical muscle rub to affected area. She will let me know if her sx persist.   6. Intertrigo  She was given rx nystatin powder to apply to affected area twice daily as needed.   7. Dyspnea on exertion  Her sx could be due to deconditioning. ETT results from 2018 were reviewed in detail. She agrees to 2D echocardiogram for further evaluation.   - ECHOCARDIOGRAM COMPLETE; Future  8. Class 1 obesity due to excess calories with serious comorbidity and body mass index (BMI) of 33.0 to 33.9 in adult  She is encouraged to strive for BMI less than 30 to decrease cardiac risk. Importance of regular exercise was discussed with the patient.  She is encouraged to start slowly - start with 10 minutes twice daily at least three to four days per week and to gradually build to 30 minutes five days weekly. She was given tips to incorporate more activity into her daily routine - take stairs when possible, park farther away from her job, grocery stores, etc.    9. Vitamin D deficiency disease  I WILL CHECK A VIT D  LEVEL AND SUPPLEMENT AS NEEDED.  ALSO ENCOURAGED TO SPEND 15 MINUTES IN THE SUN DAILY.  - Vitamin D (25 hydroxy)    [name], MD    THE PATIENT IS ENCOURAGED TO PRACTICE SOCIAL DISTANCING DUE TO THE COVID-19 PANDEMIC.

## 2019-03-24 LAB — VITAMIN D 25 HYDROXY (VIT D DEFICIENCY, FRACTURES): Vit D, 25-Hydroxy: 32.5 ng/mL (ref 30.0–100.0)

## 2019-03-25 ENCOUNTER — Telehealth: Payer: Self-pay

## 2019-03-25 ENCOUNTER — Other Ambulatory Visit: Payer: Self-pay | Admitting: Internal Medicine

## 2019-03-25 ENCOUNTER — Ambulatory Visit: Payer: Self-pay

## 2019-03-25 DIAGNOSIS — E1122 Type 2 diabetes mellitus with diabetic chronic kidney disease: Secondary | ICD-10-CM

## 2019-03-25 MED ORDER — VITAMIN D (ERGOCALCIFEROL) 1.25 MG (50000 UNIT) PO CAPS: 50000.0000 [IU] | ORAL_CAPSULE | ORAL | 1 refills | Status: DC

## 2019-03-25 NOTE — Chronic Care Management (AMB) (Signed)
  Chronic Care Management   Social Work Note  03/25/2019 Name: [name] [name] MRN: [id number] DOB: 1950/09/12  [name] [name] is a 68 y.o. year old female who sees Glendale Chard, MD for primary care. The CCM team was consulted for assistance with [name] .   CCM SW placed an unsuccessful outbound call to the patient in efforts to conduct a quarterly SDOH (Social determinants of health) screen. SW left a HIPAA compliant voice message requesting a return call.  Follow Up Plan: SW will follow up with patient by phone over the next 10 days.  Daneen Schick, BSW, CDP Social Worker, Certified Dementia Practitioner [facility] [phone]

## 2019-03-28 ENCOUNTER — Other Ambulatory Visit: Payer: Self-pay

## 2019-03-28 ENCOUNTER — Ambulatory Visit (INDEPENDENT_AMBULATORY_CARE_PROVIDER_SITE_OTHER): Payer: Medicare Other | Admitting: Pharmacist

## 2019-03-28 ENCOUNTER — Other Ambulatory Visit (HOSPITAL_COMMUNITY): Payer: Medicare Other

## 2019-03-28 DIAGNOSIS — N182 Chronic kidney disease, stage 2 (mild): Secondary | ICD-10-CM

## 2019-03-28 DIAGNOSIS — Z794 Long term (current) use of insulin: Secondary | ICD-10-CM | POA: Diagnosis not present

## 2019-03-28 DIAGNOSIS — I10 Essential (primary) hypertension: Secondary | ICD-10-CM | POA: Diagnosis not present

## 2019-03-28 DIAGNOSIS — E1122 Type 2 diabetes mellitus with diabetic chronic kidney disease: Secondary | ICD-10-CM

## 2019-03-28 MED ORDER — AMLODIPINE BESYLATE 10 MG PO TABS: ORAL_TABLET | ORAL | 1 refills | Status: DC

## 2019-04-01 ENCOUNTER — Ambulatory Visit: Payer: Self-pay

## 2019-04-01 ENCOUNTER — Telehealth: Payer: Self-pay

## 2019-04-01 DIAGNOSIS — E1122 Type 2 diabetes mellitus with diabetic chronic kidney disease: Secondary | ICD-10-CM

## 2019-04-01 NOTE — Progress Notes (Signed)
Chronic Care Management    Visit Note  03/28/2019 Name: [name] [name] MRN: [id number] DOB: 1951-06-14  Referred by: Glendale Chard, MD Reason for referral : Chronic Care Management   [name] [name] is a 68 y.o. year old female who is a primary care patient of Glendale Chard, MD. The CCM team was consulted for assistance with chronic disease management and care coordination needs related to HTN DMII  Review of patient status, including review of consultants reports, relevant laboratory and other test results, and collaboration with appropriate care team members and the patient's provider was performed as part of comprehensive patient evaluation and provision of chronic care management services.    I spoke with [name] [name] by telephone today  Advanced Directives Status: [place] and [employer] application for related entries.   Medications: Outpatient Encounter Medications as of 03/28/2019  Medication Sig   aspirin 81 MG tablet Take 81 mg by mouth daily.   BD PEN NEEDLE NANO U/F 32G X 4 MM MISC USE TO INJECT INSULIN DAILY   beta carotene 25000 UNIT capsule Take 25,000 Units by mouth daily.   BIOTIN PO Take 500 mcg by mouth daily.    Cholecalciferol (CVS VIT D 5000 HIGH-POTENCY PO) Take 1 tablet by mouth daily.    CHROMIUM PICOLINATE PO Take 200 mcg by mouth daily.    Coenzyme Q10 (COQ-10) 100 MG CAPS Take 1 tablet by mouth daily.    Cyanocobalamin (VITAMIN B-12 PO) Take 500 mcg by mouth.    Dulaglutide (TRULICITY) 2.99 ME/2.6ST SOPN Inject 0.75 mg into the skin once a week.   Flaxseed, Linseed, (FLAXSEED OIL) 1000 MG CAPS Take 1 capsule by mouth daily.    glucosamine-chondroitin 500-400 MG tablet Take 1 tablet by mouth daily. Reported on 12/17/2015   glucose blood (ONETOUCH VERIO) test strip CHECK BLOOD SUGAR THREE TIMES DAILY dx: e11.65   Krill Oil 350 MG CAPS Take 350 mg by mouth daily.    magnesium oxide (MAG-OX) 400 MG tablet Take 400 mg by mouth daily.    metoprolol tartrate (LOPRESSOR) 50 MG tablet TAKE 1 TABLET BY MOUTH TWICE DAILY   nystatin (NYSTATIN) powder Apply topically 3 (three) times daily. As needed   ONETOUCH DELICA LANCETS 41D MISC USE AS DIRECTED TO CHECK BLOOD SUGAR THREE TIMES DAILY   pregabalin (LYRICA) 50 MG capsule TAKE 1 CAPSULE(50 MG) BY MOUTH TWICE DAILY   rosuvastatin (CRESTOR) 10 MG tablet TAKE 1 TABLET BY MOUTH DAILY   rosuvastatin (CRESTOR) 20 MG tablet Take 1 tablet (20 mg total) by mouth daily.   traZODone (DESYREL) 50 MG tablet TAKE 1 TABLET BY MOUTH EVERY NIGHT AT BEDTIME   TRESIBA FLEXTOUCH 200 UNIT/ML SOPN INJECT 34 UNITS UNDER THE SKIN EVERY NIGHT AT BEDTIME, MAX DOSE IS 80 UNITS (Patient taking differently: 25 Units. Patient does not take the [place] for BG <125)   vitamin C (ASCORBIC ACID) 500 MG tablet Take 1,000 mg by mouth 2 (two) times daily.    Vitamin D, Ergocalciferol, (DRISDOL) 1.25 MG (50000 UT) CAPS capsule Take 1 capsule (50,000 Units total) by mouth every 7 (seven) days.   vitamin E 200 UNIT capsule Take 200 Units by mouth daily.   [DISCONTINUED] amLODipine (NORVASC) 10 MG tablet TAKE 1 TABLET(10 MG) BY MOUTH DAILY   No facility-administered encounter medications on file as of 03/28/2019.      Objective:   Goals Addressed            This Visit's Progress  Patient Stated    I would like to reduce my A1c  (pt-stated)       Pharmacist Clinical Goal(s):   Over the next 90 days, patient will demonstrate Improved medication adherence as evidenced by daily medication adherence, FBG <130 and controlled BGs throughout the day: 02/03/19: Target Goal Date revised to 90 days due to treatment delays secondary to COVID-19  Current Barriers:   Diabetes: T2DM; most recent A1c 8% on 01/26/2019 (decreased from 9.1% on 11/23/2018).  Patient states she is adhering to a healthier diet. She has also lost 4 lbs.  Current antihyperglycemic regimen: Tresiba 25 units (this has increased based on recent  BGs), Trulicity 0.75mg   denies hypoglycemic symptoms; denies hyperglycemic symptoms  Current meal patterns: patient is now mindful of carbohydrates; choosing meat and vegetables over carbs o Encouraged patient to avoid juices/sugary drinks  Current exercise: walking  Current blood glucose readings: FBG<130  Cardiovascular risk reduction: o Current hypertensive regimen: amlodipine, metoprolol BID o Current hyperlipidemia regimen:  Crestor 20mg  daily (last filled 90 DS on 02/14/19)  PharmD & CCM RN Interventions:  Comprehensive medication review performed, medication list updated in the EMR.  Reviewed medication fill history via insurance claims data confirming patient appears compliant with having her medications filled on time as prescribed by provider.  Reviewed & discussed the following diabetes-related information with patient: o Follow ADA recommended "diabetes-friendly" diet  (reviewed healthy snack/food options) o Discussed insulin/GLP-1 injection technique; Patient uses One touch Verio glucometer o Reviewed medication purpose/side effects-->patient denies adverse events  Patient Self Care Activities:   Patient will check blood glucose daily , document, and provide at future appointments  Patient will focus on medication adherence by continuing to take medications as prescribed  Patient will take medications as prescribed  Patient will contact provider with any episodes of hypoglycemia  Patient will report any questions or concerns to provider    Please see past updates related to this goal by clicking on the "Past Updates" button in the selected goal          Plan:   The care management team will reach out to the patient again over the next 6 weeks.  02/16/19, PharmD, BCPS Clinical Pharmacist, [facility]  [facility]  Direct Dial: [phone]

## 2019-04-01 NOTE — Patient Instructions (Signed)
Visit Information  Goals Addressed            This Visit's Progress     Patient Stated   . I would like to reduce my A1c  (pt-stated)       Pharmacist Clinical Goal(s):  Marland Kitchen Over the next 90 days, patient will demonstrate Improved medication adherence as evidenced by daily medication adherence, FBG <130 and controlled BGs throughout the day: 02/03/19: Target Goal Date revised to 90 days due to treatment delays secondary to COVID-19  Current Barriers:  . Diabetes: T2DM; most recent A1c 8% on 01/26/2019 (decreased from 9.1% on 11/23/2018).  Patient states she is adhering to a healthier diet. She has also lost 4 lbs. . Current antihyperglycemic regimen: Tresiba 25 units (this has increased based on recent BGs), Trulicity 0.75mg  . denies hypoglycemic symptoms; denies hyperglycemic symptoms . Current meal patterns: patient is now mindful of carbohydrates; choosing meat and vegetables over carbs o Encouraged patient to avoid juices/sugary drinks . Current exercise: walking . Current blood glucose readings: FBG<130 . Cardiovascular risk reduction: o Current hypertensive regimen: amlodipine, metoprolol BID o Current hyperlipidemia regimen:  Crestor 20mg  daily (last filled 90 DS on 02/14/19)  PharmD & CCM RN Interventions: . Comprehensive medication review performed, medication list updated in the EMR.  Reviewed medication fill history via insurance claims data confirming patient appears compliant with having her medications filled on time as prescribed by provider. . Reviewed & discussed the following diabetes-related information with patient: o Follow ADA recommended "diabetes-friendly" diet  (reviewed healthy snack/food options) o Discussed insulin/GLP-1 injection technique; Patient uses One touch Verio glucometer o Reviewed medication purpose/side effects-->patient denies adverse events  Patient Self Care Activities:  . Patient will check blood glucose daily , document, and provide at future  appointments . Patient will focus on medication adherence by continuing to take medications as prescribed . Patient will take medications as prescribed . Patient will contact provider with any episodes of hypoglycemia . Patient will report any questions or concerns to provider    Please see past updates related to this goal by clicking on the "Past Updates" button in the selected goal         The patient verbalized understanding of instructions provided today and declined a print copy of patient instruction materials.   The care management team will reach out to the patient again over the next 6 weeks.  Regina Eck, PharmD, BCPS Clinical Pharmacist, [facility] [place]: [phone]

## 2019-04-01 NOTE — Chronic Care Management (AMB) (Signed)
  Chronic Care Management   Social Work Note  04/01/2019 Name: [name] [name] MRN: [id number] DOB: Aug 31, 1950  Second unsuccessful outbound call to the patient to assist with care coordination and conduct a quarterly SDOH screen. SW left a HIPAA compliant voice message requesting a return call.  Follow Up Plan: SW will follow up with the patient over the next 2-3 weeks if no return call is received.  Daneen Schick, BSW, CDP Social Worker, Certified Dementia Practitioner [place] [facility] [place] [facility] [phone]

## 2019-04-03 ENCOUNTER — Other Ambulatory Visit: Payer: Self-pay | Admitting: Internal Medicine

## 2019-04-03 MED ORDER — PREGABALIN 50 MG PO CAPS: ORAL_CAPSULE | ORAL | 1 refills | Status: DC

## 2019-04-08 ENCOUNTER — Other Ambulatory Visit (HOSPITAL_COMMUNITY)
Admission: RE | Admit: 2019-04-08 | Discharge: 2019-04-08 | Disposition: A | Discharge status: Home / Self Care | Payer: Medicare Other | Source: Ambulatory Visit | Attending: Neurology | Admitting: Neurology

## 2019-04-08 DIAGNOSIS — Z01812 Encounter for preprocedural laboratory examination: Secondary | ICD-10-CM | POA: Insufficient documentation

## 2019-04-08 DIAGNOSIS — Z20828 Contact with and (suspected) exposure to other viral communicable diseases: Secondary | ICD-10-CM | POA: Insufficient documentation

## 2019-04-09 LAB — NOVEL CORONAVIRUS, NAA (HOSP ORDER, SEND-OUT TO REF LAB; TAT 18-24 HRS): SARS-CoV-2, NAA: NOT DETECTED

## 2019-04-12 ENCOUNTER — Ambulatory Visit (INDEPENDENT_AMBULATORY_CARE_PROVIDER_SITE_OTHER): Payer: Medicare Other | Admitting: Neurology

## 2019-04-12 DIAGNOSIS — G4733 Obstructive sleep apnea (adult) (pediatric): Secondary | ICD-10-CM

## 2019-04-12 DIAGNOSIS — I1310 Hypertensive heart and chronic kidney disease without heart failure, with stage 1 through stage 4 chronic kidney disease, or unspecified chronic kidney disease: Secondary | ICD-10-CM

## 2019-04-12 DIAGNOSIS — E1122 Type 2 diabetes mellitus with diabetic chronic kidney disease: Secondary | ICD-10-CM

## 2019-04-12 DIAGNOSIS — I25119 Atherosclerotic heart disease of native coronary artery with unspecified angina pectoris: Secondary | ICD-10-CM

## 2019-04-12 DIAGNOSIS — R0609 Other forms of dyspnea: Secondary | ICD-10-CM

## 2019-04-12 DIAGNOSIS — Z794 Long term (current) use of insulin: Secondary | ICD-10-CM

## 2019-04-12 DIAGNOSIS — I739 Peripheral vascular disease, unspecified: Secondary | ICD-10-CM

## 2019-04-13 NOTE — Procedures (Signed)
PATIENT'S NAME:  [name] [name], Spohr DOB:      12/19/1950      MR#:    [id number]     DATE OF RECORDING: 04/12/2019  CGA REFERRING M.D.:  [name], MD Study Performed:   Titration to positive airway pressure Patient returns for a CPAP Titration sleep study following her PSG from 03/06/19 which resulted in an AHI of 20.1/h, REM AHI of 60/h, supine AHI of 17.8/h and an oxygen nadir of 69%.  Moderate Obstructive Sleep Apnea The patient endorsed the Epworth Sleepiness Scale at 12/24 points.   The patient's weight 196 pounds with a height of 66 (inches), resulting in a BMI of 31.5 kg/m2. The patient's neck circumference measured 17 inches.  CURRENT MEDICATIONS: Norvasc, Aspirin, Biotin, Cholecalciferol, COQ-10, Vitamin B12, Trulicity, Mag-ox, Lyrica, Crestor, Desyrel, Vitamin C,   PROCEDURE:  This is a multichannel digital polysomnogram utilizing the SomnoStar 11.2 system.  Electrodes and sensors were applied and monitored per AASM Specifications.   EEG, EOG, Chin and Limb EMG, were sampled at 200 Hz.  ECG, Snore and Nasal Pressure, Thermal Airflow, Respiratory Effort, CPAP Flow and Pressure, Oximetry was sampled at 50 Hz. Digital video and audio were recorded.      CPAP was initiated at 5 cmH20 with heated humidity per AASM split night standards and pressure was advanced to 10 cmH20 because of hypopneas, apneas and desaturations.  At a PAP pressure of 10 cmH20, there was a reduction of the AHI to 2.4/h with improvement of sleep apnea. Lights Out was at 21:38 and Lights On at 04:59. Total recording time (TRT) was 442 minutes, with a total sleep time (TST) of 271.5 minutes. The patient's sleep latency was 42 minutes. REM latency was 222 minutes. The sleep efficiency was poor at 61.4 %.    SLEEP ARCHITECTURE: WASO (Wake after sleep onset) was 75.5 minutes. There were 6.5 minutes in Stage N1, 144.5 minutes Stage N2, 85.5 minutes Stage N3 and 35 minutes in Stage REM.  The percentage of Stage N1 was 2.4%,  Stage N2 was 53.2%, Stage N3 was 31.5% and Stage R (REM sleep) was 12.9%.   RESPIRATORY ANALYSIS:  There was a total of 27 respiratory events: 3 obstructive apneas, 5 central apneas and 0 mixed apneas with a total of 8 apneas and an apnea index (AI) of 1.8 /hour. There were 19 hypopneas with a hypopnea index of 4.2/hour. The total APNEA/HYPOPNEA INDEX (AHI) was 6.0 /h. 9 events occurred in REM sleep and 18 events in NREM. The REM AHI was 15.4 /hour versus a non-REM AHI of 4.6 /hour. The patient spent 234 minutes of total sleep time in the supine position and 38 minutes in non-supine. The supine AHI was 6.5, versus a non-supine AHI of 3.2.  OXYGEN SATURATION & C02:  The baseline 02 saturation was 98%, with the lowest being 87%. Time spent below 89% saturation equaled 5 minutes.  The arousals were noted as: 17 were spontaneous, 0 were associated with PLMs, and 2 were associated with respiratory events. The patient had a total of 0 Periodic Limb Movements.   Audio and video analysis did not show any abnormal or unusual movements, behaviors, phonations or vocalizations. The patient took bathroom breaks. Snoring was controlled. EKG was in keeping with normal sinus rhythm. Post-study, the patient indicated that sleep was the same as usual.  The patient was fitted with a ResMed Mirage Quattro Medium full face mask.  DIAGNOSIS 1. Obstructive Sleep Apnea became well controlled under 9 and 10 cm  water CPAP, using a FFM.    PLANS/RECOMMENDATIONS: autotitration capable CPAP machine with an pressure window from 6-12 cm water, and ResMed medium FFM or any interface of patients choice.    A follow up appointment will be scheduled in the Sleep Clinic at [facility].   Please call [phone] with any questions.      I certify that I have reviewed the entire raw data recording prior to the issuance of this report in accordance with the Standards of Accreditation of the American Academy of  Sleep Medicine (AASM)   Larey Seat, M.D. Diplomat, Tax adviser of Psychiatry and Neurology  Diplomat, Tax adviser of Sleep Medicine Market researcher, [employer] [facility] at [employer]

## 2019-04-13 NOTE — Addendum Note (Signed)
Addended by: Larey Seat on: 04/13/2019 04:58 PM   Modules accepted: Orders

## 2019-04-14 ENCOUNTER — Other Ambulatory Visit (HOSPITAL_COMMUNITY): Payer: Medicare Other

## 2019-04-14 ENCOUNTER — Telehealth: Payer: Self-pay

## 2019-04-14 NOTE — Telephone Encounter (Signed)
Pt has returned the call to Ginnie Smart, please call

## 2019-04-14 NOTE — Telephone Encounter (Signed)
-----   Message from Larey Seat, MD sent at 04/13/2019  4:58 PM EDT ----- DIAGNOSIS  1. Obstructive Sleep Apnea became well controlled under 9 and 10  cm water CPAP, using a FFM.    PLANS/RECOMMENDATIONS: autotitration capable CPAP machine with an  pressure window from 6-12 cm water, and ResMed medium FFM or any  interface of patients choice.    A follow up appointment will be scheduled in the Sleep Clinic at  [facility].  Please call [phone] with  any questions.

## 2019-04-14 NOTE — Telephone Encounter (Signed)
I called pt again to discuss her sleep study results. No answer, left a message asking her to call me back. 

## 2019-04-14 NOTE — Progress Notes (Signed)
Cardiology Office Note   Date:  04/15/2019   ID:  [name] [name], DOB Dec 02, 1950, MRN [id number]  PCP:  Glendale Chard, MD  Cardiologist:   Minus Breeding, MD   Chief Complaint  Patient presents with  . Shortness of Breath      History of Present Illness: [name] [name] is a 68 y.o. female who presents follow up of CAD.   She had a history of a PCI in 2005.  She had a stress perfusion study in 2013 demonstrated no evidence of ischemia and well-preserved ejection fraction.  In 2018 she had a negative inadequate POET (Plain Old Exercise Treadmill).    Since I last talked to her on a virtual visit.  She has had some increased dyspnea.  She has been walking 3 to 5 miles a day and then she had to stop because she had a fall couple months ago.  She was getting a little short of breath at the end of that prior to that follow-up.  She gets short of breath more than she thought she had been.  She was not describing any resting shortness of breath, PND or orthopnea.  She is not been having any chest discomfort, neck or arm discomfort.  He has not been having any palpitations, presyncope or syncope.  She has had some chronic right shoulder pain that she says is there all the time and she thinks is arthritis.  She does not remember her symptoms from 2006 when she did have her PCI.  Of note she is pending an echocardiogram which has been ordered by Dr. Baird Cancer.  Of note the patient was diagnosed with sleep apnea and is being fitted for CPAP.   Past Medical History:  Diagnosis Date  . Anemia   . Carpal tunnel syndrome   . Coronary artery disease    DES to ostial RCA 2005  . Diabetes mellitus without complication ([place])    No longer taking meds  . Dyslipidemia   . Hypertension   . Osteoarthritis   . PAD (peripheral artery disease) ([place])   . Sleep apnea     Past Surgical History:  Procedure Laterality Date  . APPENDECTOMY    . BREAST EXCISIONAL BIOPSY Right >10+ yrs ago   benign  .  heart stent    . TONSILLECTOMY    . WRIST SURGERY       Current Outpatient Medications  Medication Sig Dispense Refill  . amLODipine (NORVASC) 10 MG tablet TAKE 1 TABLET(10 MG) BY MOUTH DAILY 90 tablet 1  . aspirin 81 MG tablet Take 81 mg by mouth daily.    . BD PEN NEEDLE NANO U/F 32G X 4 MM MISC USE TO INJECT INSULIN DAILY 100 each 1  . beta carotene 25000 UNIT capsule Take 25,000 Units by mouth daily.    Marland Kitchen BIOTIN PO Take 500 mcg by mouth daily.     . Cholecalciferol (CVS VIT D 5000 HIGH-POTENCY PO) Take 1 tablet by mouth daily.     . CHROMIUM PICOLINATE PO Take 200 mcg by mouth daily.     . Coenzyme Q10 (COQ-10) 100 MG CAPS Take 1 tablet by mouth daily.     . Cyanocobalamin (VITAMIN B-12 PO) Take 500 mcg by mouth.     . Dulaglutide (TRULICITY) 3.00 PQ/3.3AQ SOPN Inject 0.75 mg into the skin once a week. 4 pen 1  . Flaxseed, Linseed, (FLAXSEED OIL) 1000 MG CAPS Take 1 capsule by mouth daily.     Marland Kitchen glucosamine-chondroitin  500-400 MG tablet Take 1 tablet by mouth daily. Reported on 12/17/2015    . glucose blood (ONETOUCH VERIO) test strip CHECK BLOOD SUGAR THREE TIMES DAILY dx: e11.65 150 each 11  . Insulin Degludec (TRESIBA FLEXTOUCH) 200 UNIT/ML SOPN Inject 25 Units into the skin daily.    Boris Lown Oil 350 MG CAPS Take 350 mg by mouth daily.     . magnesium oxide (MAG-OX) 400 MG tablet Take 400 mg by mouth daily.    . metoprolol tartrate (LOPRESSOR) 50 MG tablet TAKE 1 TABLET BY MOUTH TWICE DAILY 180 tablet 4  . nystatin (NYSTATIN) powder Apply topically 3 (three) times daily. As needed 60 g 0  . ONETOUCH DELICA LANCETS 33G MISC USE AS DIRECTED TO CHECK BLOOD SUGAR THREE TIMES DAILY 100 each 0  . pregabalin (LYRICA) 50 MG capsule TAKE 1 CAPSULE(50 MG) BY MOUTH TWICE DAILY 180 capsule 1  . rosuvastatin (CRESTOR) 20 MG tablet Take 1 tablet (20 mg total) by mouth daily. 90 tablet 3  . traZODone (DESYREL) 50 MG tablet TAKE 1 TABLET BY MOUTH EVERY NIGHT AT BEDTIME 30 tablet 1  . vitamin C  (ASCORBIC ACID) 500 MG tablet Take 1,000 mg by mouth 2 (two) times daily.     . Vitamin D, Ergocalciferol, (DRISDOL) 1.25 MG (50000 UT) CAPS capsule Take 1 capsule (50,000 Units total) by mouth every 7 (seven) days. 12 capsule 1  . vitamin E 200 UNIT capsule Take 200 Units by mouth daily.     No current facility-administered medications for this visit.     Allergies:   Fish allergy and Latex    ROS:  Please see the history of present illness.   Otherwise, review of systems are positive for none.   All other systems are reviewed and negative.    PHYSICAL EXAM: VS:  BP 139/84   Pulse 67   Ht 5' 3.5" (1.613 m)   Wt 194 lb 9.6 oz (88.3 kg)   SpO2 99%   BMI 33.93 kg/m  , BMI Body mass index is 33.93 kg/m. GENERAL:  Well appearing NECK:  No jugular venous distention, waveform within normal limits, carotid upstroke brisk and symmetric, no bruits, no thyromegaly LUNGS:  Clear to auscultation bilaterally CHEST:  Unremarkable HEART:  PMI not displaced or sustained,S1 and S2 within normal limits, no S3, no S4, no clicks, no rubs, no murmurs ABD:  Flat, positive bowel sounds normal in frequency in pitch, no bruits, no rebound, no guarding, no midline pulsatile mass, no hepatomegaly, no splenomegaly EXT:  2 plus pulses throughout, no edema, no cyanosis no clubbing   EKG:  EKG is ordered today. The ekg ordered today demonstrates sinus rhythm, rate 65 premature ectopic complexes, low voltage on the limb leads, poor anterior R wave progression, nonspecific T wave flattening.   Recent Labs: 01/26/2019: ALT 11; BNP 32.9; BUN 14; Creatinine, Ser 1.02; Potassium 4.1; Sodium 142 03/23/2019: Hemoglobin 12.7; Platelets 265    Lipid Panel    Component Value Date/Time   CHOL 150 01/26/2019 1001   TRIG 80 01/26/2019 1001   HDL 53 01/26/2019 1001   CHOLHDL 2.8 01/26/2019 1001   CHOLHDL 3.0 Ratio 09/16/2010 2137   VLDL 39 09/16/2010 2137   LDLCALC 81 01/26/2019 1001      Wt Readings from Last 3  Encounters:  04/15/19 194 lb 9.6 oz (88.3 kg)  03/23/19 192 lb 6.4 oz (87.3 kg)  02/17/19 196 lb 8 oz (89.1 kg)      Other  studies Reviewed: Additional studies/ records that were reviewed today include:Labs. Review of the above records demonstrates:  Please see elsewhere in the note.     ASSESSMENT AND PLAN:  CAD:   She has some atypical right shoulder pain.  She had a negative inadequate treadmill test in 2018 but I do not think she is having any symptoms consistent with unstable angina although if she has increasing dyspnea going forward I might be assessed with perfusion imaging.   DYSLIPIDEMIA:   LDL was 81 with an HDL of 53 in August.  She will continue on meds as listed.  I increased her Crestor the last visit.  DM:  Her A1c was 8.0.  However, she has had her meds adjusted to the current regimen above.  She is having this followed closely by Dr. Allyne GeeSanders.  HTN: Her blood pressure is at target.  No change in therapy.   DYSPNEA: She is going to get an echocardiogram for her primary provider is happy to review this.  Did note that she had a BNP that was 33.6.  If this is normal I will have her monitor back on her walking regimen and only come back for further evaluation of this if this gets worse rather than better as she gets back into that.  Current medicines are reviewed at length with the patient today.  The patient does not have concerns regarding medicines.  The following changes have been made:  no change  Labs/ tests ordered today include:  Orders Placed This Encounter  Procedures  . EKG 12-Lead     Disposition:   FU with me in one year.     Signed, Rollene RotundaJames Dublin Grayer, MD  04/15/2019 11:26 AM    [facility]

## 2019-04-14 NOTE — Telephone Encounter (Signed)
I called pt. She is on a conference call and will call me back later.

## 2019-04-15 ENCOUNTER — Ambulatory Visit (INDEPENDENT_AMBULATORY_CARE_PROVIDER_SITE_OTHER): Payer: Medicare Other | Admitting: Cardiology

## 2019-04-15 ENCOUNTER — Encounter: Payer: Self-pay | Admitting: Cardiology

## 2019-04-15 ENCOUNTER — Other Ambulatory Visit: Payer: Self-pay

## 2019-04-15 VITALS — BP 139/84 | HR 67 | Ht 63.5 in | Wt 194.6 lb

## 2019-04-15 DIAGNOSIS — E118 Type 2 diabetes mellitus with unspecified complications: Secondary | ICD-10-CM

## 2019-04-15 DIAGNOSIS — E785 Hyperlipidemia, unspecified: Secondary | ICD-10-CM

## 2019-04-15 DIAGNOSIS — I2510 Atherosclerotic heart disease of native coronary artery without angina pectoris: Secondary | ICD-10-CM | POA: Diagnosis not present

## 2019-04-15 DIAGNOSIS — I10 Essential (primary) hypertension: Secondary | ICD-10-CM

## 2019-04-15 NOTE — Patient Instructions (Signed)
Medication Instructions:  Your physician recommends that you continue on your current medications as directed. Please refer to the Current Medication list given to you today.  If you need a refill on your cardiac medications before your next appointment, please call your pharmacy.   Lab work: NONE  Testing/Procedures: NONE  Follow-Up: At [facility], you and your health needs are our priority.  As part of our continuing mission to provide you with exceptional heart care, we have created designated Provider Care Teams.  These Care Teams include your primary Cardiologist (physician) and Advanced Practice Providers (APPs -  Physician Assistants and Nurse Practitioners) who all work together to provide you with the care you need, when you need it. You may see James Hochrein, MD or one of the following Advanced Practice Providers on your designated Care Team:    Rhonda Barrett, PA-C  Kathryn Lawrence, DNP, ANP  Cadence Furth, NP  Your physician wants you to follow-up in: 1 year. You will receive a reminder letter in the mail two months in advance. If you don't receive a letter, please call our office to schedule the follow-up appointment.    

## 2019-04-18 ENCOUNTER — Other Ambulatory Visit: Payer: Self-pay

## 2019-04-18 MED ORDER — TRESIBA FLEXTOUCH 200 UNIT/ML ~~LOC~~ SOPN: 34.0000 [IU] | PEN_INJECTOR | Freq: Every day | SUBCUTANEOUS | 1 refills | Status: DC

## 2019-04-19 ENCOUNTER — Ambulatory Visit (HOSPITAL_COMMUNITY): Payer: Medicare Other | Attending: Cardiology

## 2019-04-19 ENCOUNTER — Other Ambulatory Visit: Payer: Self-pay

## 2019-04-19 ENCOUNTER — Encounter: Payer: Self-pay | Admitting: Neurology

## 2019-04-19 DIAGNOSIS — R0609 Other forms of dyspnea: Secondary | ICD-10-CM

## 2019-04-19 DIAGNOSIS — R0600 Dyspnea, unspecified: Secondary | ICD-10-CM | POA: Diagnosis not present

## 2019-04-20 ENCOUNTER — Ambulatory Visit: Payer: Self-pay

## 2019-04-20 ENCOUNTER — Telehealth: Payer: Self-pay

## 2019-04-20 DIAGNOSIS — E1122 Type 2 diabetes mellitus with diabetic chronic kidney disease: Secondary | ICD-10-CM

## 2019-04-20 DIAGNOSIS — N182 Chronic kidney disease, stage 2 (mild): Secondary | ICD-10-CM

## 2019-04-20 DIAGNOSIS — I10 Essential (primary) hypertension: Secondary | ICD-10-CM

## 2019-04-20 NOTE — Chronic Care Management (AMB) (Signed)
  Chronic Care Management   Social Work Note  04/20/2019 Name: [name] [name] MRN: [id number] DOB: 1951/06/07  SW placed a third unsuccessful outbound call to assess for resource needs. SW unable to leave a voice message due to phone continually ringing without initiation of a mailbox.   Follow Up Plan: No further SW follow up planned at this time. Patient will remain active with RN Case Manager and PharmD. SW available to assist with any future resource needs identified.  Daneen Schick, BSW, CDP Social Worker, Certified Dementia Practitioner [place] [facility] [place] [facility] [phone]

## 2019-04-21 NOTE — Telephone Encounter (Signed)
Patient has read my mychart message informing her that we needed to discuss results at this point I will wait for the patient to contact us to move forward.

## 2019-04-28 NOTE — Telephone Encounter (Signed)
Pt returned call and I was able to review that information with her of her sleep study results. I called pt. I advised pt that Dr. Brett Fairy reviewed their sleep study results and found that pt was treated with CPAP. Dr. Brett Fairy recommends that pt starts auto CPAP. I reviewed PAP compliance expectations with the pt. Pt is agreeable to starting a CPAP. I advised pt that an order will be sent to a DME, [facility], and [facility] will call the pt within about one week after they file with the pt's insurance. [facility] will show the pt how to use the machine, fit for masks, and troubleshoot the CPAP if needed. A follow up appt was made for insurance purposes with Debbora Presto, NP on Jan 7,2020 at 8 am. Pt verbalized understanding to arrive 15 minutes early and bring their CPAP. A letter with all of this information in it will be mailed to the pt as a reminder. I verified with the pt that the address we have on file is correct. Pt verbalized understanding of results. Pt had no questions at this time but was encouraged to call back if questions arise. I have sent the order to [facility] and have received confirmation that they have received the order.

## 2019-05-02 ENCOUNTER — Telehealth: Payer: Self-pay

## 2019-05-04 ENCOUNTER — Ambulatory Visit: Payer: Self-pay | Admitting: Pharmacist

## 2019-05-04 DIAGNOSIS — G4733 Obstructive sleep apnea (adult) (pediatric): Secondary | ICD-10-CM | POA: Diagnosis not present

## 2019-05-04 DIAGNOSIS — I10 Essential (primary) hypertension: Secondary | ICD-10-CM

## 2019-05-04 DIAGNOSIS — E1122 Type 2 diabetes mellitus with diabetic chronic kidney disease: Secondary | ICD-10-CM

## 2019-05-04 DIAGNOSIS — N182 Chronic kidney disease, stage 2 (mild): Secondary | ICD-10-CM

## 2019-05-04 NOTE — Progress Notes (Signed)
  Chronic Care Management   Outreach Note  05/04/2019 Name: [name] [name] MRN: [id number] DOB: [birth year]  Referred by: Glendale Chard, MD Reason for referral : Chronic Care Management   An unsuccessful telephone outreach was attempted today. The patient was referred to the case management team by for assistance with care management and care coordination.   Follow Up Plan: A HIPPA compliant phone message was left for the patient providing contact information and requesting a return call.  The care management team will reach out to the patient again over the next 10-14 days.    SIGNATURE Regina Eck, PharmD, BCPS Clinical Pharmacist, [facility] [place]: [phone]

## 2019-05-05 ENCOUNTER — Other Ambulatory Visit: Payer: Self-pay

## 2019-05-05 ENCOUNTER — Ambulatory Visit (INDEPENDENT_AMBULATORY_CARE_PROVIDER_SITE_OTHER): Payer: Medicare Other | Admitting: Internal Medicine

## 2019-05-05 ENCOUNTER — Encounter: Payer: Self-pay | Admitting: Internal Medicine

## 2019-05-05 VITALS — BP 120/84 | HR 73 | Temp 98.5°F | Ht 63.5 in | Wt 194.4 lb

## 2019-05-05 DIAGNOSIS — E1122 Type 2 diabetes mellitus with diabetic chronic kidney disease: Secondary | ICD-10-CM | POA: Diagnosis not present

## 2019-05-05 DIAGNOSIS — K921 Melena: Secondary | ICD-10-CM | POA: Diagnosis not present

## 2019-05-05 DIAGNOSIS — Z794 Long term (current) use of insulin: Secondary | ICD-10-CM | POA: Diagnosis not present

## 2019-05-05 DIAGNOSIS — E6609 Other obesity due to excess calories: Secondary | ICD-10-CM

## 2019-05-05 DIAGNOSIS — I129 Hypertensive chronic kidney disease with stage 1 through stage 4 chronic kidney disease, or unspecified chronic kidney disease: Secondary | ICD-10-CM | POA: Diagnosis not present

## 2019-05-05 DIAGNOSIS — K219 Gastro-esophageal reflux disease without esophagitis: Secondary | ICD-10-CM

## 2019-05-05 DIAGNOSIS — Z6833 Body mass index (BMI) 33.0-33.9, adult: Secondary | ICD-10-CM

## 2019-05-05 DIAGNOSIS — G4733 Obstructive sleep apnea (adult) (pediatric): Secondary | ICD-10-CM

## 2019-05-05 DIAGNOSIS — N182 Chronic kidney disease, stage 2 (mild): Secondary | ICD-10-CM | POA: Diagnosis not present

## 2019-05-05 MED ORDER — OMEPRAZOLE 20 MG PO CPDR: 20.0000 mg | DELAYED_RELEASE_CAPSULE | Freq: Every day | ORAL | 1 refills | Status: DC

## 2019-05-05 MED ORDER — TRULICITY 1.5 MG/0.5ML ~~LOC~~ SOAJ: 1.5000 mg | SUBCUTANEOUS | 1 refills | Status: DC

## 2019-05-05 NOTE — Patient Instructions (Signed)

## 2019-05-06 LAB — BMP8+EGFR
BUN/Creatinine Ratio: 13 (ref 12–28)
BUN: 10 mg/dL (ref 8–27)
CO2: 28 mmol/L (ref 20–29)
Calcium: 9.4 mg/dL (ref 8.7–10.3)
Chloride: 102 mmol/L (ref 96–106)
Creatinine, Ser: 0.79 mg/dL (ref 0.57–1.00)
GFR calc Af Amer: 89 mL/min/{1.73_m2} (ref >59)
GFR calc non Af Amer: 77 mL/min/{1.73_m2} (ref >59)
Glucose: 106 mg/dL — ABNORMAL HIGH (ref 65–99)
Potassium: 4.8 mmol/L (ref 3.5–5.2)
Sodium: 144 mmol/L (ref 134–144)

## 2019-05-06 LAB — HEMOGLOBIN A1C
Est. average glucose Bld gHb Est-mCnc: 180 mg/dL
Hgb A1c MFr Bld: 7.9 % — ABNORMAL HIGH (ref 4.8–5.6)

## 2019-05-06 LAB — CBC
Hematocrit: 38.7 % (ref 34.0–46.6)
Hemoglobin: 12.3 g/dL (ref 11.1–15.9)
MCH: 22.8 pg — ABNORMAL LOW (ref 26.6–33.0)
MCHC: 31.8 g/dL (ref 31.5–35.7)
MCV: 72 fL — ABNORMAL LOW (ref 79–97)
Platelets: 288 10*3/uL (ref 150–450)
RBC: 5.39 x10E6/uL — ABNORMAL HIGH (ref 3.77–5.28)
RDW: 16.3 % — ABNORMAL HIGH (ref 11.7–15.4)
WBC: 6.5 10*3/uL (ref 3.4–10.8)

## 2019-05-17 ENCOUNTER — Ambulatory Visit (INDEPENDENT_AMBULATORY_CARE_PROVIDER_SITE_OTHER): Payer: Medicare Other | Admitting: Pharmacist

## 2019-05-17 DIAGNOSIS — I10 Essential (primary) hypertension: Secondary | ICD-10-CM | POA: Diagnosis not present

## 2019-05-17 DIAGNOSIS — Z794 Long term (current) use of insulin: Secondary | ICD-10-CM | POA: Diagnosis not present

## 2019-05-17 DIAGNOSIS — N182 Chronic kidney disease, stage 2 (mild): Secondary | ICD-10-CM

## 2019-05-17 DIAGNOSIS — E1122 Type 2 diabetes mellitus with diabetic chronic kidney disease: Secondary | ICD-10-CM | POA: Diagnosis not present

## 2019-05-18 ENCOUNTER — Other Ambulatory Visit: Payer: Self-pay

## 2019-05-18 MED ORDER — TRULICITY 1.5 MG/0.5ML ~~LOC~~ SOAJ: 1.5000 mg | SUBCUTANEOUS | 1 refills | Status: DC

## 2019-05-20 NOTE — Progress Notes (Signed)
Chronic Care Management   Visit Note  05/17/2019 Name: [name] [name] MRN: [id number] DOB: [birth year]  Referred by: [name] Chard, MD Reason for referral : Chronic Care Management   [name] [name] is a 68 y.o. year old female who is a primary care patient of [name] Chard, MD. The CCM team was consulted for assistance with chronic disease management and care coordination needs related to HLD and DMII  Review of patient status, including review of consultants reports, relevant laboratory and other test results, and collaboration with appropriate care team members and the patient's provider was performed as part of comprehensive patient evaluation and provision of chronic care management services.    I spoke with [name] [name] by telephone today.  Medications: Outpatient Encounter Medications as of 05/17/2019  Medication Sig   amLODipine (NORVASC) 10 MG tablet TAKE 1 TABLET(10 MG) BY MOUTH DAILY   aspirin 81 MG tablet Take 81 mg by mouth daily.   BD PEN NEEDLE NANO U/F 32G X 4 MM MISC USE TO INJECT INSULIN DAILY   beta carotene 25000 UNIT capsule Take 25,000 Units by mouth daily.   BIOTIN PO Take 500 mcg by mouth daily.    Cholecalciferol (CVS VIT D 5000 HIGH-POTENCY PO) Take 1 tablet by mouth daily.    CHROMIUM PICOLINATE PO Take 200 mcg by mouth daily.    Coenzyme Q10 (COQ-10) 100 MG CAPS Take 1 tablet by mouth daily.    Cyanocobalamin (VITAMIN B-12 PO) Take 500 mcg by mouth.    Flaxseed, Linseed, (FLAXSEED OIL) 1000 MG CAPS Take 1 capsule by mouth daily.    glucosamine-chondroitin 500-400 MG tablet Take 1 tablet by mouth daily. Reported on 12/17/2015   glucose blood (ONETOUCH VERIO) test strip CHECK BLOOD SUGAR THREE TIMES DAILY dx: e11.65   Insulin Degludec (TRESIBA FLEXTOUCH) 200 UNIT/ML SOPN Inject 34 Units into the skin daily. Max dose is 80 units   Krill Oil 350 MG CAPS Take 350 mg by mouth daily.    magnesium oxide (MAG-OX) 400 MG tablet Take 400 mg by mouth  daily.   metoprolol tartrate (LOPRESSOR) 50 MG tablet TAKE 1 TABLET BY MOUTH TWICE DAILY   nystatin (NYSTATIN) powder Apply topically 3 (three) times daily. As needed   omeprazole (PRILOSEC) 20 MG capsule Take 1 capsule (20 mg total) by mouth daily.   ONETOUCH DELICA LANCETS 93Z MISC USE AS DIRECTED TO CHECK BLOOD SUGAR THREE TIMES DAILY   pregabalin (LYRICA) 50 MG capsule TAKE 1 CAPSULE(50 MG) BY MOUTH TWICE DAILY   rosuvastatin (CRESTOR) 20 MG tablet Take 1 tablet (20 mg total) by mouth daily.   traZODone (DESYREL) 50 MG tablet TAKE 1 TABLET BY MOUTH EVERY NIGHT AT BEDTIME   vitamin C (ASCORBIC ACID) 500 MG tablet Take 1,000 mg by mouth 2 (two) times daily.    Vitamin D, Ergocalciferol, (DRISDOL) 1.25 MG (50000 UT) CAPS capsule Take 1 capsule (50,000 Units total) by mouth every 7 (seven) days.   vitamin E 200 UNIT capsule Take 200 Units by mouth daily.   [DISCONTINUED] Dulaglutide (TRULICITY) 1.5 JI/9.6VE SOPN Inject 1.5 mg into the skin once a week.   No facility-administered encounter medications on file as of 05/17/2019.      Objective:   Goals Addressed            This Visit's Progress     Patient Stated    I would like to reduce my A1c  (pt-stated)       Pharmacist Clinical Goal(s):   Over the  next 90 days, patient will demonstrate Improved medication adherence as evidenced by daily medication adherence, FBG <130 and controlled BGs throughout the day: 05/17/19: Target Goal Date revised to 90 days due to treatment delays secondary to COVID-19  Current Barriers:   Diabetes: T2DM; most recent A1c 7.9% on 05/05/19 (was 8% on 8/5, 9.1% on 6/2).  Patient states she is adhering to a healthier diet. She has also lost 4 lbs.  She is working to lose more.  Current antihyperglycemic regimen: Tresiba 34 units (this has increased based on recent BGs), Trulicity 1.5mg  weekly o Trulicity dose increased-->new RX sent to to pharmacy on 05/18/19  denies hypoglycemic symptoms;  denies hyperglycemic symptoms  Current meal patterns: patient is now mindful of carbohydrates; choosing meat and vegetables over carbs o Encouraged patient to avoid juices/sugary drinks  Current exercise: walking  Current blood glucose readings: FBG<130  Cardiovascular risk reduction: o Current hypertensive regimen: amlodipine, metoprolol BID o Current hyperlipidemia regimen:  Crestor 20mg  daily (last filled 90 DS on 02/14/19)  PharmD & CCM RN Interventions:  Comprehensive medication review performed, medication list updated in the EMR.  Reviewed medication fill history via insurance claims data confirming patient appears compliant with having her medications filled on time as prescribed by provider.  Reviewed & discussed the following diabetes-related information with patient: o Follow ADA recommended "diabetes-friendly" diet  (reviewed healthy snack/food options) o Discussed insulin/GLP-1 injection technique; Patient uses One touch Verio glucometer o Reviewed medication purpose/side effects-->patient denies adverse events  Patient Self Care Activities:   Patient will check blood glucose daily , document, and provide at future appointments  Patient will focus on medication adherence by continuing to take medications as prescribed  Patient will take medications as prescribed  Patient will contact provider with any episodes of hypoglycemia  Patient will report any questions or concerns to provider    Please see past updates related to this goal by clicking on the "Past Updates" button in the selected goal          Plan:   The care management team will reach out to the patient again over the next 30 days.   Provider Signature  02/16/19, PharmD, BCPS Clinical Pharmacist, [facility]  [facility]  Direct Dial: [phone]

## 2019-05-20 NOTE — Patient Instructions (Signed)
Visit Information  Goals Addressed            This Visit's Progress     Patient Stated   . I would like to reduce my A1c  (pt-stated)       Pharmacist Clinical Goal(s):  Marland Kitchen Over the next 90 days, patient will demonstrate Improved medication adherence as evidenced by daily medication adherence, FBG <130 and controlled BGs throughout the day: 05/17/19: Target Goal Date revised to 90 days due to treatment delays secondary to COVID-19  Current Barriers:  . Diabetes: T2DM; most recent A1c 7.9% on 05/05/19 (was 8% on 8/5, 9.1% on 6/2).  Patient states she is adhering to a healthier diet. She has also lost 4 lbs.  She is working to lose more. . Current antihyperglycemic regimen: Tresiba 34 units (this has increased based on recent BGs), Trulicity 1.5mg  weekly o Trulicity dose increased-->new RX sent to to pharmacy on 05/18/19 . denies hypoglycemic symptoms; denies hyperglycemic symptoms . Current meal patterns: patient is now mindful of carbohydrates; choosing meat and vegetables over carbs o Encouraged patient to avoid juices/sugary drinks . Current exercise: walking . Current blood glucose readings: FBG<130 . Cardiovascular risk reduction: o Current hypertensive regimen: amlodipine, metoprolol BID o Current hyperlipidemia regimen:  Crestor 20mg  daily (last filled 90 DS on 02/14/19)  PharmD & CCM RN Interventions: . Comprehensive medication review performed, medication list updated in the EMR.  Reviewed medication fill history via insurance claims data confirming patient appears compliant with having her medications filled on time as prescribed by provider. . Reviewed & discussed the following diabetes-related information with patient: o Follow ADA recommended "diabetes-friendly" diet  (reviewed healthy snack/food options) o Discussed insulin/GLP-1 injection technique; Patient uses One touch Verio glucometer o Reviewed medication purpose/side effects-->patient denies adverse events  Patient  Self Care Activities:  . Patient will check blood glucose daily , document, and provide at future appointments . Patient will focus on medication adherence by continuing to take medications as prescribed . Patient will take medications as prescribed . Patient will contact provider with any episodes of hypoglycemia . Patient will report any questions or concerns to provider    Please see past updates related to this goal by clicking on the "Past Updates" button in the selected goal         The patient verbalized understanding of instructions provided today and declined a print copy of patient instruction materials.   The care management team will reach out to the patient again over the next 30 days.   SIGNATURE Regina Eck, PharmD, BCPS Clinical Pharmacist, [facility] [place]: [phone]

## 2019-05-21 LAB — POC HEMOCCULT BLD/STL (OFFICE/1-CARD/DIAGNOSTIC): Fecal Occult Blood, POC: NEGATIVE

## 2019-05-21 NOTE — Progress Notes (Signed)
Subjective:     Patient ID: [name] [name] , female    DOB: Jul 26, 1950 , 68 y.o.   MRN: [id number]   Chief Complaint  Patient presents with  . Diabetes  . Blood In Stools  . Hypertension    HPI  She is here today for diabetes/bp check. Also concerned about blood in stools. She has noticed on two occasions, she denies constipation. Denies h/o hemorrhoids. There is also some GI discomfort.  She admits to having more heartburn than usual.  She has not tried any OTC meds for relief.   Diabetes She presents for her follow-up diabetic visit. She has type 2 diabetes mellitus. Her disease course has been improving. There are no hypoglycemic associated symptoms. Pertinent negatives for diabetes include no blurred vision and no chest pain. There are no hypoglycemic complications. Risk factors for coronary artery disease include diabetes mellitus, dyslipidemia, hypertension and post-menopausal. Current diabetic treatment includes insulin injections. She is compliant with treatment most of the time. She is following a diabetic diet. She participates in exercise intermittently. Eye exam is current.  Hypertension This is a chronic problem. The current episode started more than 1 year ago. The problem has been gradually improving since onset. The problem is controlled. Pertinent negatives include no blurred vision, chest pain or palpitations. Risk factors for coronary artery disease include diabetes mellitus, dyslipidemia, obesity, post-menopausal state and sedentary lifestyle. Past treatments include angiotensin blockers. The current treatment provides moderate improvement. Compliance problems include exercise and diet.  Hypertensive end-organ damage includes kidney disease. Identifiable causes of hypertension include chronic renal disease.     Past Medical History:  Diagnosis Date  . Anemia   . Carpal tunnel syndrome   . Coronary artery disease    DES to ostial RCA 2005  . Diabetes mellitus without  complication ([place])    No longer taking meds  . Dyslipidemia   . Hypertension   . Osteoarthritis   . PAD (peripheral artery disease) ([place])   . Sleep apnea      Family History  Problem Relation Age of Onset  . CAD Father 52  . Heart attack Father   . CAD Mother 18  . Heart attack Mother   . CAD Brother   . Cancer Brother        prostate  . CAD Sister   . Cancer Sister        breast  . CAD Brother      Current Outpatient Medications:  .  amLODipine (NORVASC) 10 MG tablet, TAKE 1 TABLET(10 MG) BY MOUTH DAILY, Disp: 90 tablet, Rfl: 1 .  aspirin 81 MG tablet, Take 81 mg by mouth daily., Disp: , Rfl:  .  BD PEN NEEDLE NANO U/F 32G X 4 MM MISC, USE TO INJECT INSULIN DAILY, Disp: 100 each, Rfl: 1 .  beta carotene 25000 UNIT capsule, Take 25,000 Units by mouth daily., Disp: , Rfl:  .  BIOTIN PO, Take 500 mcg by mouth daily. , Disp: , Rfl:  .  Cholecalciferol (CVS VIT D 5000 HIGH-POTENCY PO), Take 1 tablet by mouth daily. , Disp: , Rfl:  .  CHROMIUM PICOLINATE PO, Take 200 mcg by mouth daily. , Disp: , Rfl:  .  Coenzyme Q10 (COQ-10) 100 MG CAPS, Take 1 tablet by mouth daily. , Disp: , Rfl:  .  Cyanocobalamin (VITAMIN B-12 PO), Take 500 mcg by mouth. , Disp: , Rfl:  .  Flaxseed, Linseed, (FLAXSEED OIL) 1000 MG CAPS, Take 1 capsule by mouth daily. ,  Disp: , Rfl:  .  glucosamine-chondroitin 500-400 MG tablet, Take 1 tablet by mouth daily. Reported on 12/17/2015, Disp: , Rfl:  .  glucose blood (ONETOUCH VERIO) test strip, CHECK BLOOD SUGAR THREE TIMES DAILY dx: e11.65, Disp: 150 each, Rfl: 11 .  Insulin Degludec (TRESIBA FLEXTOUCH) 200 UNIT/ML SOPN, Inject 34 Units into the skin daily. Max dose is 80 units, Disp: 9 pen, Rfl: 1 .  Krill Oil 350 MG CAPS, Take 350 mg by mouth daily. , Disp: , Rfl:  .  magnesium oxide (MAG-OX) 400 MG tablet, Take 400 mg by mouth daily., Disp: , Rfl:  .  metoprolol tartrate (LOPRESSOR) 50 MG tablet, TAKE 1 TABLET BY MOUTH TWICE DAILY, Disp: 180 tablet, Rfl: 4 .   nystatin (NYSTATIN) powder, Apply topically 3 (three) times daily. As needed, Disp: 60 g, Rfl: 0 .  ONETOUCH DELICA LANCETS 12Y MISC, USE AS DIRECTED TO CHECK BLOOD SUGAR THREE TIMES DAILY, Disp: 100 each, Rfl: 0 .  pregabalin (LYRICA) 50 MG capsule, TAKE 1 CAPSULE(50 MG) BY MOUTH TWICE DAILY, Disp: 180 capsule, Rfl: 1 .  rosuvastatin (CRESTOR) 20 MG tablet, Take 1 tablet (20 mg total) by mouth daily., Disp: 90 tablet, Rfl: 3 .  traZODone (DESYREL) 50 MG tablet, TAKE 1 TABLET BY MOUTH EVERY NIGHT AT BEDTIME, Disp: 30 tablet, Rfl: 1 .  vitamin C (ASCORBIC ACID) 500 MG tablet, Take 1,000 mg by mouth 2 (two) times daily. , Disp: , Rfl:  .  Vitamin D, Ergocalciferol, (DRISDOL) 1.25 MG (50000 UT) CAPS capsule, Take 1 capsule (50,000 Units total) by mouth every 7 (seven) days., Disp: 12 capsule, Rfl: 1 .  Dulaglutide (TRULICITY) 1.5 QM/2.5OI SOPN, Inject 1.5 mg into the skin once a week., Disp: 12 pen, Rfl: 1 .  omeprazole (PRILOSEC) 20 MG capsule, Take 1 capsule (20 mg total) by mouth daily., Disp: 30 capsule, Rfl: 1 .  vitamin E 200 UNIT capsule, Take 200 Units by mouth daily., Disp: , Rfl:    Allergies  Allergen Reactions  . [employer]  . Latex Rash     Review of Systems  Constitutional: Negative.   Eyes: Negative for blurred vision.  Respiratory: Negative.   Cardiovascular: Negative.  Negative for chest pain and palpitations.  Gastrointestinal: Positive for blood in stool.       She has increased heartburn. There is some associated heartburn.  Neurological: Negative.   Psychiatric/Behavioral: Negative.      Today's Vitals   05/05/19 1441  BP: 120/84  Pulse: 73  Temp: 98.5 F (36.9 C)  TempSrc: Oral  Weight: 194 lb 6.4 oz (88.2 kg)  Height: 5' 3.5" (1.613 m)  PainSc: 8   PainLoc: Shoulder   Body mass index is 33.9 kg/m.   Objective:  Physical Exam Vitals signs and nursing note reviewed.  Constitutional:      Appearance: Normal appearance.  HENT:     Head:  Normocephalic and atraumatic.  Cardiovascular:     Rate and Rhythm: Normal rate and regular rhythm.     Heart sounds: Normal heart sounds.  Pulmonary:     Effort: Pulmonary effort is normal.     Breath sounds: Normal breath sounds.  Abdominal:     General: Bowel sounds are normal.     Palpations: Abdomen is soft. There is no mass.     Hernia: No hernia is present.  Genitourinary:    Rectum: Normal. Guaiac result negative.  Skin:    General: Skin is  warm.  Neurological:     General: No focal deficit present.     Mental Status: She is alert.  Psychiatric:        Mood and Affect: Mood normal.        Behavior: Behavior normal.         Assessment And Plan:     1. Type 2 diabetes mellitus with stage 2 chronic kidney disease, with long-term current use of insulin ([place])  I will check labs as listed below. She is encouraged to walk 30 minutes four to five days weekly.   - Hemoglobin A1c - BMP8+EGFR  2. Blood in stool  DRE performed, hemoccult negative. She agrees to let me know if her sx persist. If so, I will refer her to GI at that time. Her last colonoscopy was performed in 2018 by Dr. Oletta Lamas at [facility].   - CBC no Diff - POC Hemoccult Bld/Stl (1-Cd Office Dx)  3. Parenchymal renal hypertension, stage 1 through stage 4 or unspecified chronic kidney disease  Chronic, well controlled. She will continue with current meds. She is encouraged to avoid adding salt to her foods.   4. Gastroesophageal reflux disease without esophagitis  I will start her on omeprazole, 85m daily. She will rto in six weeks for re-evaluation.   5. OSA (obstructive sleep apnea)  Chronic. Importance of CPAP compliance was discussed with the patient.   6. Class 1 obesity due to excess calories with serious comorbidity and body mass index (BMI) of 33.0 to 33.9 in adult  She is encouraged to strive for BMI less than 29 to decrease cardiac risk.   RMaximino Greenland MD    THE PATIENT IS  ENCOURAGED TO PRACTICE SOCIAL DISTANCING DUE TO THE COVID-19 PANDEMIC.

## 2019-05-24 ENCOUNTER — Other Ambulatory Visit: Payer: Self-pay

## 2019-05-24 MED ORDER — TRULICITY 1.5 MG/0.5ML ~~LOC~~ SOAJ: 1.5000 mg | SUBCUTANEOUS | 1 refills | Status: DC

## 2019-06-02 ENCOUNTER — Ambulatory Visit (INDEPENDENT_AMBULATORY_CARE_PROVIDER_SITE_OTHER): Payer: Medicare Other | Admitting: Internal Medicine

## 2019-06-02 ENCOUNTER — Other Ambulatory Visit: Payer: Self-pay

## 2019-06-02 ENCOUNTER — Encounter: Payer: Self-pay | Admitting: Internal Medicine

## 2019-06-02 VITALS — BP 112/76 | HR 77 | Temp 98.3°F | Ht 63.5 in | Wt 194.6 lb

## 2019-06-02 DIAGNOSIS — K921 Melena: Secondary | ICD-10-CM

## 2019-06-02 DIAGNOSIS — Z8719 Personal history of other diseases of the digestive system: Secondary | ICD-10-CM | POA: Diagnosis not present

## 2019-06-02 DIAGNOSIS — K219 Gastro-esophageal reflux disease without esophagitis: Secondary | ICD-10-CM | POA: Diagnosis not present

## 2019-06-03 DIAGNOSIS — G4733 Obstructive sleep apnea (adult) (pediatric): Secondary | ICD-10-CM | POA: Diagnosis not present

## 2019-06-05 NOTE — Progress Notes (Signed)
This visit occurred during the SARS-CoV-2 public health emergency.  Safety protocols were in place, including screening questions prior to the visit, additional usage of staff PPE, and extensive cleaning of exam room while observing appropriate contact time as indicated for disinfecting solutions.  Subjective:     Patient ID: [name] [name] , female    DOB: 1950-07-03 , 68 y.o.   MRN: [id number]   Chief Complaint  Patient presents with  . Gastroesophageal Reflux    HPI  She is here today for f/u GERD.  She reports feeling better since starting omeprazole. She has not had any further episodes of blood in her stools.   Gastroesophageal Reflux She reports no abdominal pain, no belching or no heartburn. This is a new problem. The symptoms are aggravated by certain foods. Risk factors include obesity and lack of exercise. The treatment provided moderate relief.     Past Medical History:  Diagnosis Date  . Anemia   . Carpal tunnel syndrome   . Coronary artery disease    DES to ostial RCA 2005  . Diabetes mellitus without complication ([facility])    No longer taking meds  . Dyslipidemia   . Hypertension   . Osteoarthritis   . PAD (peripheral artery disease) ([facility])   . Sleep apnea      Family History  Problem Relation Age of Onset  . CAD Father 58  . Heart attack Father   . CAD Mother 75  . Heart attack Mother   . CAD Brother   . Cancer Brother        prostate  . CAD Sister   . Cancer Sister        breast  . CAD Brother      Current Outpatient Medications:  .  amLODipine (NORVASC) 10 MG tablet, TAKE 1 TABLET(10 MG) BY MOUTH DAILY, Disp: 90 tablet, Rfl: 1 .  aspirin 81 MG tablet, Take 81 mg by mouth daily., Disp: , Rfl:  .  BD PEN NEEDLE NANO U/F 32G X 4 MM MISC, USE TO INJECT INSULIN DAILY, Disp: 100 each, Rfl: 1 .  beta carotene 74128 UNIT capsule, Take 25,000 Units by mouth daily., Disp: , Rfl:  .  BIOTIN PO, Take 500 mcg by mouth daily. , Disp: , Rfl:  .  Cholecalciferol  (CVS VIT D 5000 HIGH-POTENCY PO), Take 1 tablet by mouth daily. , Disp: , Rfl:  .  CHROMIUM PICOLINATE PO, Take 200 mcg by mouth daily. , Disp: , Rfl:  .  Coenzyme Q10 (COQ-10) 100 MG CAPS, Take 1 tablet by mouth daily. , Disp: , Rfl:  .  Cyanocobalamin (VITAMIN B-12 PO), Take 500 mcg by mouth. , Disp: , Rfl:  .  Dulaglutide (TRULICITY) 1.5 MG/0.5ML SOPN, Inject 1.5 mg into the skin once a week., Disp: 12 pen, Rfl: 1 .  Flaxseed, Linseed, (FLAXSEED OIL) 1000 MG CAPS, Take 1 capsule by mouth daily. , Disp: , Rfl:  .  glucosamine-chondroitin 500-400 MG tablet, Take 1 tablet by mouth daily. Reported on 12/17/2015, Disp: , Rfl:  .  glucose blood (ONETOUCH VERIO) test strip, CHECK BLOOD SUGAR THREE TIMES DAILY dx: e11.65, Disp: 150 each, Rfl: 11 .  Insulin Degludec (TRESIBA FLEXTOUCH) 200 UNIT/ML SOPN, Inject 34 Units into the skin daily. Max dose is 80 units (Patient taking differently: Inject 25 Units into the skin daily. Max dose is 80 units), Disp: 9 pen, Rfl: 1 .  Krill Oil 350 MG CAPS, Take 350 mg by mouth daily. ,  Disp: , Rfl:  .  magnesium oxide (MAG-OX) 400 MG tablet, Take 400 mg by mouth daily., Disp: , Rfl:  .  metoprolol tartrate (LOPRESSOR) 50 MG tablet, TAKE 1 TABLET BY MOUTH TWICE DAILY, Disp: 180 tablet, Rfl: 4 .  nystatin (NYSTATIN) powder, Apply topically 3 (three) times daily. As needed, Disp: 60 g, Rfl: 0 .  omeprazole (PRILOSEC) 20 MG capsule, Take 1 capsule (20 mg total) by mouth daily., Disp: 30 capsule, Rfl: 1 .  ONETOUCH DELICA LANCETS 11H MISC, USE AS DIRECTED TO CHECK BLOOD SUGAR THREE TIMES DAILY, Disp: 100 each, Rfl: 0 .  pregabalin (LYRICA) 50 MG capsule, TAKE 1 CAPSULE(50 MG) BY MOUTH TWICE DAILY, Disp: 180 capsule, Rfl: 1 .  rosuvastatin (CRESTOR) 20 MG tablet, Take 1 tablet (20 mg total) by mouth daily., Disp: 90 tablet, Rfl: 3 .  traZODone (DESYREL) 50 MG tablet, TAKE 1 TABLET BY MOUTH EVERY NIGHT AT BEDTIME, Disp: 30 tablet, Rfl: 1 .  vitamin C (ASCORBIC ACID) 500 MG  tablet, Take 1,000 mg by mouth 2 (two) times daily. , Disp: , Rfl:  .  Vitamin D, Ergocalciferol, (DRISDOL) 1.25 MG (50000 UT) CAPS capsule, Take 1 capsule (50,000 Units total) by mouth every 7 (seven) days., Disp: 12 capsule, Rfl: 1 .  vitamin E 200 UNIT capsule, Take 200 Units by mouth daily., Disp: , Rfl:    Allergies  Allergen Reactions  . [place]  . Latex Rash     Review of Systems  Constitutional: Negative.   Respiratory: Negative.   Cardiovascular: Negative.   Gastrointestinal: Negative.  Negative for abdominal pain and heartburn.  Neurological: Negative.   Psychiatric/Behavioral: Negative.      Today's Vitals   06/02/19 1152  BP: 112/76  Pulse: 77  Temp: 98.3 F (36.8 C)  TempSrc: Oral  Weight: 194 lb 9.6 oz (88.3 kg)  Height: 5' 3.5" (1.613 m)   Body mass index is 33.93 kg/m.   Objective:  Physical Exam Vitals and nursing note reviewed.  Constitutional:      Appearance: Normal appearance.  HENT:     Head: Normocephalic and atraumatic.  Cardiovascular:     Rate and Rhythm: Normal rate and regular rhythm.     Heart sounds: Normal heart sounds.  Pulmonary:     Effort: Pulmonary effort is normal.     Breath sounds: Normal breath sounds.  Skin:    General: Skin is warm.  Neurological:     General: No focal deficit present.     Mental Status: She is alert.  Psychiatric:        Mood and Affect: Mood normal.        Behavior: Behavior normal.         Assessment And Plan:     1. Gastroesophageal reflux disease without esophagitis  Newly diagnosed. She will continue with omeprazole 20mg  daily. I will try to wean her off after she completes 90 days of treatment.   2. Blood in stool  Resolved. She agrees with GI referral should her sx return.   [name], MD    THE PATIENT IS ENCOURAGED TO PRACTICE SOCIAL DISTANCING DUE TO THE COVID-19 PANDEMIC.

## 2019-06-22 ENCOUNTER — Telehealth: Payer: Self-pay | Admitting: Pharmacist

## 2019-06-27 ENCOUNTER — Other Ambulatory Visit: Payer: Self-pay

## 2019-06-27 ENCOUNTER — Ambulatory Visit (INDEPENDENT_AMBULATORY_CARE_PROVIDER_SITE_OTHER): Payer: Medicare HMO | Admitting: Pharmacist

## 2019-06-27 DIAGNOSIS — Z794 Long term (current) use of insulin: Secondary | ICD-10-CM | POA: Diagnosis not present

## 2019-06-27 DIAGNOSIS — E1122 Type 2 diabetes mellitus with diabetic chronic kidney disease: Secondary | ICD-10-CM | POA: Diagnosis not present

## 2019-06-27 DIAGNOSIS — N182 Chronic kidney disease, stage 2 (mild): Secondary | ICD-10-CM

## 2019-06-27 MED ORDER — TRULICITY 1.5 MG/0.5ML ~~LOC~~ SOAJ: 1.5000 mg | SUBCUTANEOUS | 1 refills | Status: DC

## 2019-06-27 MED ORDER — BD PEN NEEDLE NANO U/F 32G X 4 MM MISC: 3 refills | Status: DC

## 2019-06-27 NOTE — Progress Notes (Signed)
Chronic Care Management  Visit Note  06/27/2019 Name: [name] [name] MRN: [id number] DOB: August 01, 1950  Referred by: [name] Peng, MD Reason for referral : Chronic Care Management (diabetes)   [name] [name] is a 69 y.o. year old female who is a primary care patient of [name] Peng, MD. The CCM team was consulted for assistance with chronic disease management and care coordination needs related to DMII  Review of patient status, including review of consultants reports, relevant laboratory and other test results, and collaboration with appropriate care team members and the patient's provider was performed as part of comprehensive patient evaluation and provision of chronic care management services.    I spoke with [name] [name] by telephone today.  Medications: Outpatient Encounter Medications as of 06/27/2019  Medication Sig  . amLODipine (NORVASC) 10 MG tablet TAKE 1 TABLET(10 MG) BY MOUTH DAILY  . aspirin 81 MG tablet Take 81 mg by mouth daily.  . beta carotene 93818 UNIT capsule Take 25,000 Units by mouth daily.  Marland Kitchen BIOTIN PO Take 500 mcg by mouth daily.   . Cholecalciferol ([employer] VIT D 5000 HIGH-POTENCY PO) Take 1 tablet by mouth daily.   . CHROMIUM PICOLINATE PO Take 200 mcg by mouth daily.   . Coenzyme Q10 (COQ-10) 100 MG CAPS Take 1 tablet by mouth daily.   . Cyanocobalamin (VITAMIN B-12 PO) Take 500 mcg by mouth.   . Flaxseed, Linseed, (FLAXSEED OIL) 1000 MG CAPS Take 1 capsule by mouth daily.   Marland Kitchen glucosamine-chondroitin 500-400 MG tablet Take 1 tablet by mouth daily. Reported on 12/17/2015  . glucose blood (ONETOUCH VERIO) test strip CHECK BLOOD SUGAR THREE TIMES DAILY dx: e11.65  . Krill Oil 350 MG CAPS Take 350 mg by mouth daily.   . magnesium oxide (MAG-OX) 400 MG tablet Take 400 mg by mouth daily.  . metoprolol tartrate (LOPRESSOR) 50 MG tablet TAKE 1 TABLET BY MOUTH TWICE DAILY  . nystatin (NYSTATIN) powder Apply topically 3 (three) times daily. As needed  . omeprazole  (PRILOSEC) 20 MG capsule Take 1 capsule (20 mg total) by mouth daily.  [name] [name] DELICA LANCETS 33G MISC USE AS DIRECTED TO CHECK BLOOD SUGAR THREE TIMES DAILY  . pregabalin (LYRICA) 50 MG capsule TAKE 1 CAPSULE(50 MG) BY MOUTH TWICE DAILY  . rosuvastatin (CRESTOR) 20 MG tablet Take 1 tablet (20 mg total) by mouth daily.  . traZODone (DESYREL) 50 MG tablet TAKE 1 TABLET BY MOUTH EVERY NIGHT AT BEDTIME  . vitamin C (ASCORBIC ACID) 500 MG tablet Take 1,000 mg by mouth 2 (two) times daily.   . Vitamin D, Ergocalciferol, (DRISDOL) 1.25 MG (50000 UT) CAPS capsule Take 1 capsule (50,000 Units total) by mouth every 7 (seven) days.  . vitamin E 200 UNIT capsule Take 200 Units by mouth daily.  . [DISCONTINUED] BD PEN NEEDLE NANO U/F 32G X 4 MM MISC USE TO INJECT INSULIN DAILY  . [DISCONTINUED] Dulaglutide (TRULICITY) 1.5 MG/0.5ML SOPN Inject 1.5 mg into the skin once a week.  . [DISCONTINUED] Insulin Degludec (TRESIBA FLEXTOUCH) 200 UNIT/ML SOPN Inject 34 Units into the skin daily. Max dose is 80 units (Patient taking differently: Inject 25 Units into the skin daily. Max dose is 80 units)   No facility-administered encounter medications on file as of 06/27/2019.     Objective:   Goals Addressed            This Visit's Progress     Patient Stated   . I would like to reduce my A1c  (  pt-stated)       Pharmacist Clinical Goal(s):  Marland Kitchen Over the next 90 days, patient will demonstrate Improved medication adherence as evidenced by daily medication adherence, FBG <130 and controlled BGs throughout the day: 06/27/19: Target Goal Date revised to 90 days due to treatment delays secondary to COVID-19  Current Barriers:  . Diabetes: T2DM; most recent A1c 7.9% on 05/05/19 (was 8% on 8/5, 9.1% on 6/2).  Patient states she is adhering to a healthier diet. She has also lost 4 lbs.  She is working to lose more. . Current antihyperglycemic regimen: Tresiba 34 units (this has increased based on recent BGs), Trulicity  1.5mg  weekly o Trulicity dose increased-->new RX sent to to pharmacy on 05/18/19 o Refills called into to pharmacy for DM meds on 06/27/2019 . denies hypoglycemic symptoms; denies hyperglycemic symptoms . Current meal patterns: patient is now mindful of carbohydrates; choosing meat and vegetables over carbs o Encouraged patient to avoid juices/sugary drinks . Current exercise: walking . Current blood glucose readings: FBG<130 . Cardiovascular risk reduction: o Current hypertensive regimen: amlodipine, metoprolol BID o Current hyperlipidemia regimen:  Crestor 20mg  daily (last filled 90 DS on 05/18/19)  PharmD & CCM RN Interventions: . Comprehensive medication review performed, medication list updated in the EMR.  Reviewed medication fill history via insurance claims data confirming patient appears compliant with having her medications filled on time as prescribed by provider. . Reviewed & discussed the following diabetes-related information with patient: o Follow ADA recommended "diabetes-friendly" diet  (reviewed healthy snack/food options) o Discussed insulin/GLP-1 injection technique; Patient uses One touch Verio glucometer o Reviewed medication purpose/side effects-->patient denies adverse events  Patient Self Care Activities:  . Patient will check blood glucose daily , document, and provide at future appointments . Patient will focus on medication adherence by continuing to take medications as prescribed . Patient will take medications as prescribed . Patient will contact provider with any episodes of hypoglycemia . Patient will report any questions or concerns to provider    Please see past updates related to this goal by clicking on the "Past Updates" button in the selected goal          Plan:   The care management team will reach out to the patient again over the next 45 days.   Provider Signature Regina Eck, PharmD, BCPS Clinical Pharmacist, [facility]: [phone]

## 2019-06-28 ENCOUNTER — Other Ambulatory Visit: Payer: Self-pay

## 2019-06-28 MED ORDER — TRESIBA FLEXTOUCH 200 UNIT/ML ~~LOC~~ SOPN: 25.0000 [IU] | PEN_INJECTOR | Freq: Every day | SUBCUTANEOUS | 1 refills | Status: DC

## 2019-06-29 NOTE — Progress Notes (Signed)
PATIENT: [name] [name] DOB: 1951-01-23  REASON FOR VISIT: follow up HISTORY FROM: patient  Chief Complaint  Patient presents with  . Follow-up    Initial cpap f/u. Alone. Rm 1. Patient mentioned that her pump on her hose popped and she has an appt to get it fixed. She stated that otherwise she has been sleep well with it. She also mentioned that she has some numbing/pain to the right side of her head.      HISTORY OF PRESENT ILLNESS: Today 06/30/19 [name] [name] is a 69 y.o. female here today for follow up for OSA on CPAP. She presents today for her initial CPAP compliance review. She states that she is continuing to adjust to CPAP therapy. She has had some difficulty with being consistent in daily use. She feels that mask fits well. When she uses CPAP she definitely notes increased energy the next day. She is motivated to continue using CPAP therapy. She does have an appointment with her DME company tomorrow for concerns that her CPAP is not working correctly.  Compliance report dated 05/27/2019 through 06/25/2019 reveals that she used CPAP 15 of the last 30 days for compliance of 50%. 7 of the last 30 days she used CPAP greater than 4 hours for compliance of 23%. Average usage was 3 hours and 46 minutes. Dual AHI was 2.7 on 6 to 12 cm of water and an EPR of 3. There was no significant leak noted.    HISTORY: (copied from Dr Oliva Bustard note on 02/17/2019)  [name] [name] a 69 y.o. year old African American female patientseenon 02/17/2019 .Referral by Dr. Allyne Gee, MD.  Chiefconcernaccording to patient : " I don't sleep well and my legs swell" . She also feels troubled by inability to initiate sleep.   I have the pleasure of seeing [name] [name] today,a right handed African American female with a possible sleep disorder. She has a past medical history of Anemia, Carpal tunnel syndrome, Coronary artery disease, Diabetes mellitus without complication (HCC), Dyslipidemia,  Hypertension, Osteoarthritis, and PAD (peripheral artery disease) (HCC).  Familymedical /sleep history:brother had OSA, and used to sleep walk.  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 She worked in [place] and returned to [place] after the death of her son in a car accident in 1994. Her daughter lives here and she has Lupus.   She lives in a household with alone in a senior apartement . Family status is single, never married -with 1 living child,  35 grandchildren ( her deceased son had 10 children at age 23 ) .  Tobacco use: quit 2015 . ETOH use ; glass of wine once a month,  Caffeine intake in form of Coffee( 1 cup in AM ) Soda( none ) Tea ( when going out)  or energy drinks. Regular exercise in form of walking 3 days/ week .  Knee pain restricts her exercise.   Sleep habits are as follows:The patient's dinner time is between 6-7 PM. The patient goes to bed at any time between 10 and 3 AM, watching TV until she feels sleepy- takes sleep aid when ever she feels she needs it.  If she can fall asleep she sleeps for 3-4  hours, wakes for 2 bathroom breaks.   The bedroom is quiet and cool, dark-  The preferred sleep position is lateral, with the support of 2-3 pillows.  Dreams are reportedly rare. 5.30-6.30  AM is the usual rise time.  The patient wakes up spontaneously.  She reports not feeling refreshed or restored in AM, with symptoms such as dry mouth, morning headaches and some residual fatigue.  Naps are taken frequently  in front of the TV- or while reading-  lasting from 30 to 45 minutes and are refreshing.  REVIEW OF SYSTEMS: Out of a complete 14 system review of symptoms, the patient complains only of the following symptoms, headaches, numbness and all other reviewed systems are negative.  ALLERGIES: Allergies  Allergen Reactions  . Fish Allergy     Cod  .  Latex Rash    HOME MEDICATIONS: Outpatient Medications Prior to Visit  Medication Sig Dispense Refill  . amLODipine (NORVASC) 10 MG tablet TAKE 1 TABLET(10 MG) BY MOUTH DAILY 90 tablet 1  . aspirin 81 MG tablet Take 81 mg by mouth daily.    . beta carotene [id number] UNIT capsule Take 25,000 Units by mouth daily.    Marland Kitchen. BIOTIN PO Take 500 mcg by mouth daily.     . Cholecalciferol (CVS VIT D 5000 HIGH-POTENCY PO) Take 1 tablet by mouth daily.     . CHROMIUM PICOLINATE PO Take 200 mcg by mouth daily.     . Coenzyme Q10 (COQ-10) 100 MG CAPS Take 1 tablet by mouth daily.     . Cyanocobalamin (VITAMIN B-12 PO) Take 500 mcg by mouth.     . Dulaglutide (TRULICITY) 1.5 MG/0.5ML SOPN Inject 1.5 mg into the skin once a week. 12 pen 1  . Flaxseed, Linseed, (FLAXSEED OIL) 1000 MG CAPS Take 1 capsule by mouth daily.     Marland Kitchen. glucosamine-chondroitin 500-400 MG tablet Take 1 tablet by mouth daily. Reported on 12/17/2015    . glucose blood (ONETOUCH VERIO) test strip CHECK BLOOD SUGAR THREE TIMES DAILY dx: e11.65 150 each 11  . Insulin Degludec (TRESIBA FLEXTOUCH) 200 UNIT/ML SOPN Inject 26 Units into the skin daily. Max dose is 80 units 15 pen 1  . Insulin Pen Needle (BD PEN NEEDLE NANO U/F) 32G X 4 MM MISC USE TO INJECT INSULIN DAILY 100 each 3  . Krill Oil 350 MG CAPS Take 350 mg by mouth daily.     . magnesium oxide (MAG-OX) 400 MG tablet Take 400 mg by mouth daily.    . metoprolol tartrate (LOPRESSOR) 50 MG tablet TAKE 1 TABLET BY MOUTH TWICE DAILY 180 tablet 4  . nystatin (NYSTATIN) powder Apply topically 3 (three) times daily. As needed 60 g 0  . omeprazole (PRILOSEC) 20 MG capsule Take 1 capsule (20 mg total) by mouth daily. 30 capsule 1  . ONETOUCH DELICA LANCETS 33G MISC USE AS DIRECTED TO CHECK BLOOD SUGAR THREE TIMES DAILY 100 each 0  . pregabalin (LYRICA) 50 MG capsule TAKE 1 CAPSULE(50 MG) BY MOUTH TWICE DAILY 180 capsule 1  . rosuvastatin (CRESTOR) 20 MG tablet Take 1 tablet (20 mg total) by mouth  daily. 90 tablet 3  . traZODone (DESYREL) 50 MG tablet TAKE 1 TABLET BY MOUTH EVERY NIGHT AT BEDTIME 30 tablet 1  . vitamin C (ASCORBIC ACID) 500 MG tablet Take 1,000 mg by mouth 2 (two) times daily.     . Vitamin D, Ergocalciferol, (DRISDOL) 1.25 MG (50000 UT) CAPS capsule Take 1 capsule (50,000 Units total) by mouth every 7 (seven) days. 12 capsule 1  . vitamin E 200 UNIT capsule Take 200 Units by mouth daily.     No facility-administered  medications prior to visit.    PAST MEDICAL HISTORY: Past Medical History:  Diagnosis Date  . Anemia   . Carpal tunnel syndrome   . Coronary artery disease    DES to ostial RCA 2005  . Diabetes mellitus without complication ([place])    No longer taking meds  . Dyslipidemia   . Hypertension   . Osteoarthritis   . PAD (peripheral artery disease) ([place])   . Sleep apnea     PAST SURGICAL HISTORY: Past Surgical History:  Procedure Laterality Date  . APPENDECTOMY    . BREAST EXCISIONAL BIOPSY Right >10+ yrs ago   benign  . heart stent    . TONSILLECTOMY    . WRIST SURGERY      FAMILY HISTORY: Family History  Problem Relation Age of Onset  . CAD Father 73  . Heart attack Father   . CAD Mother 20  . Heart attack Mother   . CAD Brother   . Cancer Brother        prostate  . CAD Sister   . Cancer Sister        breast  . CAD Brother     SOCIAL HISTORY: Social History   Socioeconomic History  . Marital status: Single    Spouse name: Not on file  . Number of children: 2  . Years of education: Not on file  . Highest education level: Some college, no degree  Occupational History  . Occupation: retired    Comment: disabled  Tobacco Use  . Smoking status: Former Smoker    Packs/day: 0.50    Years: 30.00    Pack years: 15.00    Types: Cigarettes    Quit date: 12/20/2013    Years since quitting: 5.5  . Smokeless tobacco: Never Used  Substance and Sexual Activity  . Alcohol use: Yes    Comment: rarely  . Drug use: Not Currently     Comment: quit 2013  . Sexual activity: Not Currently  Other Topics Concern  . Not on file  Social History Narrative   Lives alone.   One son died in a car accident.     Coffee in am ,1 cup   Social Determinants of Health   Financial Resource Strain: Low Risk   . Difficulty of Paying Living Expenses: Not hard at all  Food Insecurity: No Food Insecurity  . Worried About Charity fundraiser in the Last Year: Never true  . Ran Out of Food in the Last Year: Never true  Transportation Needs: No Transportation Needs  . Lack of Transportation (Medical): No  . Lack of Transportation (Non-Medical): No  Physical Activity: Insufficiently Active  . Days of Exercise per Week: 3 days  . Minutes of Exercise per Session: 40 min  Stress: No Stress Concern Present  . Feeling of Stress : Only a little  Social Connections: Unknown  . Frequency of Communication with Friends and Family: More than three times a week  . Frequency of Social Gatherings with Friends and Family: More than three times a week  . Attends Religious Services: More than 4 times per year  . Active Member of Clubs or Organizations: Yes  . Attends Archivist Meetings: More than 4 times per year  . Marital Status: Not on file  Intimate Partner Violence: Not At Risk  . Fear of Current or Ex-Partner: No  . Emotionally Abused: No  . Physically Abused: No  . Sexually Abused: No  PHYSICAL EXAM  Vitals:   06/30/19 0806  BP: 128/78  Pulse: 63  Temp: (!) 95.6 F (35.3 C)  TempSrc: Oral  Weight: 192 lb (87.1 kg)  Height: 5' 3.5" (1.613 m)   Body mass index is 33.48 kg/m.  Generalized: Well developed, in no acute distress  Cardiology: normal rate and rhythm, no murmur noted Respiratory: Clear to auscultation bilaterally Neurological examination  Mentation: Alert oriented to time, place, history taking. Follows all commands speech and language fluent Cranial nerve II-XII: Pupils were equal round reactive to  light. Extraocular movements were full, visual field were full  Motor: The motor testing reveals 5 over 5 strength of all 4 extremities. Good symmetric motor tone is noted throughout.  Gait and station: Gait is normal.   DIAGNOSTIC DATA (LABS, IMAGING, TESTING) - I reviewed patient records, labs, notes, testing and imaging myself where available.  No flowsheet data found.   Lab Results  Component Value Date   WBC 6.5 05/05/2019   HGB 12.3 05/05/2019   HCT 38.7 05/05/2019   MCV 72 (L) 05/05/2019   PLT 288 05/05/2019      Component Value Date/Time   NA 144 05/05/2019 1559   K 4.8 05/05/2019 1559   CL 102 05/05/2019 1559   CO2 28 05/05/2019 1559   GLUCOSE 106 (H) 05/05/2019 1559   GLUCOSE 297 (H) 09/20/2014 0422   BUN 10 05/05/2019 1559   CREATININE 0.79 05/05/2019 1559   CALCIUM 9.4 05/05/2019 1559   PROT 7.6 01/26/2019 1001   ALBUMIN 4.3 01/26/2019 1001   AST 13 01/26/2019 1001   ALT 11 01/26/2019 1001   ALKPHOS 134 (H) 01/26/2019 1001   BILITOT 0.3 01/26/2019 1001   GFRNONAA 77 05/05/2019 1559   GFRAA 89 05/05/2019 1559   Lab Results  Component Value Date   CHOL 150 01/26/2019   HDL 53 01/26/2019   LDLCALC 81 01/26/2019   TRIG 80 01/26/2019   CHOLHDL 2.8 01/26/2019   Lab Results  Component Value Date   HGBA1C 7.9 (H) 05/05/2019   Lab Results  Component Value Date   VITAMINB12 1,740 (H) 09/19/2014   Lab Results  Component Value Date   TSH 0.467 09/19/2014       ASSESSMENT AND PLAN 69 y.o. year old female  has a past medical history of Anemia, Carpal tunnel syndrome, Coronary artery disease, Diabetes mellitus without complication (HCC), Dyslipidemia, Hypertension, Osteoarthritis, PAD (peripheral artery disease) (HCC), and Sleep apnea. here with     ICD-10-CM   1. OSA on CPAP  G47.33    Z99.89     Coletta continues to adjust to CPAP therapy at home. She does note significant improvement in how she feels with using CPAP therapy. Compliance report does  show suboptimal compliance. She will work on using CPAP every night and for greater than 4 hours each night. Risk of untreated sleep apnea discussed. She does have an appointment with her DME provider to assess her CPAP machine and ensure correct functioning. She will continue close follow-up with any additional concerns. We will see her back in 3 months, sooner if needed. She verbalizes understanding and agreement this plan.   No orders of the defined types were placed in this encounter.    No orders of the defined types were placed in this encounter.     I spent 15 minutes with the patient. 50% of this time was spent counseling and educating patient on plan of care and medications.    Spruha Weight, FNP-C  06/30/2019, 8:16 AM [facility] [address], [address] [place], [place] [address] [phone]

## 2019-06-29 NOTE — Patient Instructions (Addendum)
Please continue CPAP nightly and greater than 4 hours each night   Follow up in 3 months, sooner if needed   Sleep Apnea Sleep apnea affects breathing during sleep. It causes breathing to stop for a short time or to become shallow. It can also increase the risk of:  Heart attack.  Stroke.  Being very overweight (obese).  Diabetes.  Heart failure.  Irregular heartbeat. The goal of treatment is to help you breathe normally again. What are the causes? There are three kinds of sleep apnea:  Obstructive sleep apnea. This is caused by a blocked or collapsed airway.  Central sleep apnea. This happens when the brain does not send the right signals to the muscles that control breathing.  Mixed sleep apnea. This is a combination of obstructive and central sleep apnea. The most common cause of this condition is a collapsed or blocked airway. This can happen if:  Your throat muscles are too relaxed.  Your tongue and tonsils are too large.  You are overweight.  Your airway is too small. What increases the risk?  Being overweight.  Smoking.  Having a small airway.  Being older.  Being female.  Drinking alcohol.  Taking medicines to calm yourself (sedatives or tranquilizers).  Having family members with the condition. What are the signs or symptoms?  Trouble staying asleep.  Being sleepy or tired during the day.  Getting angry a lot.  Loud snoring.  Headaches in the morning.  Not being able to focus your mind (concentrate).  Forgetting things.  Less interest in sex.  Mood swings.  Personality changes.  Feelings of sadness (depression).  Waking up a lot during the night to pee (urinate).  Dry mouth.  Sore throat. How is this diagnosed?  Your medical history.  A physical exam.  A test that is done when you are sleeping (sleep study). The test is most often done in a sleep lab but may also be done at home. How is this treated?   Sleeping on your  side.  Using a medicine to get rid of mucus in your nose (decongestant).  Avoiding the use of alcohol, medicines to help you relax, or certain pain medicines (narcotics).  Losing weight, if needed.  Changing your diet.  Not smoking.  Using a machine to open your airway while you sleep, such as: ? An oral appliance. This is a mouthpiece that shifts your lower jaw forward. ? A CPAP device. This device blows air through a mask when you breathe out (exhale). ? An EPAP device. This has valves that you put in each nostril. ? A BPAP device. This device blows air through a mask when you breathe in (inhale) and breathe out.  Having surgery if other treatments do not work. It is important to get treatment for sleep apnea. Without treatment, it can lead to:  High blood pressure.  Coronary artery disease.  In men, not being able to have an erection (impotence).  Reduced thinking ability. Follow these instructions at home: Lifestyle  Make changes that your doctor recommends.  Eat a healthy diet.  Lose weight if needed.  Avoid alcohol, medicines to help you relax, and some pain medicines.  Do not use any products that contain nicotine or tobacco, such as cigarettes, e-cigarettes, and chewing tobacco. If you need help quitting, ask your doctor. General instructions  Take over-the-counter and prescription medicines only as told by your doctor.  If you were given a machine to use while you sleep, use it  only as told by your doctor.  If you are having surgery, make sure to tell your doctor you have sleep apnea. You may need to bring your device with you.  Keep all follow-up visits as told by your doctor. This is important. Contact a doctor if:  The machine that you were given to use during sleep bothers you or does not seem to be working.  You do not get better.  You get worse. Get help right away if:  Your chest hurts.  You have trouble breathing in enough air.  You have  an uncomfortable feeling in your back, arms, or stomach.  You have trouble talking.  One side of your body feels weak.  A part of your face is hanging down. These symptoms may be an emergency. Do not wait to see if the symptoms will go away. Get medical help right away. Call your local emergency services (911 in the [place] Do not drive yourself to the hospital. Summary  This condition affects breathing during sleep.  The most common cause is a collapsed or blocked airway.  The goal of treatment is to help you breathe normally while you sleep. This information is not intended to replace advice given to you by your health care provider. Make sure you discuss any questions you have with your health care provider. Document Revised: 03/26/2018 Document Reviewed: 02/02/2018 Elsevier Patient Education  [place].

## 2019-06-30 ENCOUNTER — Encounter: Payer: Self-pay | Admitting: Family Medicine

## 2019-06-30 ENCOUNTER — Other Ambulatory Visit: Payer: Self-pay

## 2019-06-30 ENCOUNTER — Ambulatory Visit (INDEPENDENT_AMBULATORY_CARE_PROVIDER_SITE_OTHER): Payer: Medicare HMO | Admitting: Family Medicine

## 2019-06-30 VITALS — BP 128/78 | HR 63 | Temp 95.6°F | Ht 63.5 in | Wt 192.0 lb

## 2019-06-30 DIAGNOSIS — Z9989 Dependence on other enabling machines and devices: Secondary | ICD-10-CM

## 2019-06-30 DIAGNOSIS — G4733 Obstructive sleep apnea (adult) (pediatric): Secondary | ICD-10-CM

## 2019-06-30 NOTE — Patient Instructions (Signed)
Visit Information  Goals Addressed            This Visit's Progress     Patient Stated   . I would like to reduce my A1c  (pt-stated)       Pharmacist Clinical Goal(s):  Marland Kitchen Over the next 90 days, patient will demonstrate Improved medication adherence as evidenced by daily medication adherence, FBG <130 and controlled BGs throughout the day: 06/27/19: Target Goal Date revised to 90 days due to treatment delays secondary to COVID-19  Current Barriers:  . Diabetes: T2DM; most recent A1c 7.9% on 05/05/19 (was 8% on 8/5, 9.1% on 6/2).  Patient states she is adhering to a healthier diet. She has also lost 4 lbs.  She is working to lose more. . Current antihyperglycemic regimen: Tresiba 34 units (this has increased based on recent BGs), Trulicity 1.5mg  weekly o Trulicity dose increased-->new RX sent to to pharmacy on 05/18/19 o Refills called into to pharmacy for DM meds on 06/27/2019 . denies hypoglycemic symptoms; denies hyperglycemic symptoms . Current meal patterns: patient is now mindful of carbohydrates; choosing meat and vegetables over carbs o Encouraged patient to avoid juices/sugary drinks . Current exercise: walking . Current blood glucose readings: FBG<130 . Cardiovascular risk reduction: o Current hypertensive regimen: amlodipine, metoprolol BID o Current hyperlipidemia regimen:  Crestor 20mg  daily (last filled 90 DS on 05/18/19)  PharmD & CCM RN Interventions: . Comprehensive medication review performed, medication list updated in the EMR.  Reviewed medication fill history via insurance claims data confirming patient appears compliant with having her medications filled on time as prescribed by provider. . Reviewed & discussed the following diabetes-related information with patient: o Follow ADA recommended "diabetes-friendly" diet  (reviewed healthy snack/food options) o Discussed insulin/GLP-1 injection technique; Patient uses One touch Verio glucometer o Reviewed medication  purpose/side effects-->patient denies adverse events  Patient Self Care Activities:  . Patient will check blood glucose daily , document, and provide at future appointments . Patient will focus on medication adherence by continuing to take medications as prescribed . Patient will take medications as prescribed . Patient will contact provider with any episodes of hypoglycemia . Patient will report any questions or concerns to provider    Please see past updates related to this goal by clicking on the "Past Updates" button in the selected goal         The patient verbalized understanding of instructions provided today and declined a print copy of patient instruction materials.   The care management team will reach out to the patient again over the next 45 days.   SIGNATURE 05/20/19, PharmD, BCPS Clinical Pharmacist, [facility]  [facility]  Direct Dial: [phone]

## 2019-07-01 DIAGNOSIS — G4733 Obstructive sleep apnea (adult) (pediatric): Secondary | ICD-10-CM | POA: Diagnosis not present

## 2019-07-04 DIAGNOSIS — G4733 Obstructive sleep apnea (adult) (pediatric): Secondary | ICD-10-CM | POA: Diagnosis not present

## 2019-07-08 ENCOUNTER — Other Ambulatory Visit: Payer: Self-pay | Admitting: Internal Medicine

## 2019-07-27 DIAGNOSIS — E559 Vitamin D deficiency, unspecified: Secondary | ICD-10-CM | POA: Diagnosis not present

## 2019-07-27 DIAGNOSIS — I10 Essential (primary) hypertension: Secondary | ICD-10-CM | POA: Diagnosis not present

## 2019-07-27 DIAGNOSIS — G4700 Insomnia, unspecified: Secondary | ICD-10-CM | POA: Diagnosis not present

## 2019-07-27 DIAGNOSIS — E669 Obesity, unspecified: Secondary | ICD-10-CM | POA: Diagnosis not present

## 2019-07-27 DIAGNOSIS — I2510 Atherosclerotic heart disease of native coronary artery without angina pectoris: Secondary | ICD-10-CM | POA: Diagnosis not present

## 2019-07-27 DIAGNOSIS — E1142 Type 2 diabetes mellitus with diabetic polyneuropathy: Secondary | ICD-10-CM | POA: Diagnosis not present

## 2019-07-27 DIAGNOSIS — H04129 Dry eye syndrome of unspecified lacrimal gland: Secondary | ICD-10-CM | POA: Diagnosis not present

## 2019-07-27 DIAGNOSIS — E785 Hyperlipidemia, unspecified: Secondary | ICD-10-CM | POA: Diagnosis not present

## 2019-07-27 DIAGNOSIS — Z794 Long term (current) use of insulin: Secondary | ICD-10-CM | POA: Diagnosis not present

## 2019-07-27 DIAGNOSIS — G8929 Other chronic pain: Secondary | ICD-10-CM | POA: Diagnosis not present

## 2019-07-28 ENCOUNTER — Ambulatory Visit (INDEPENDENT_AMBULATORY_CARE_PROVIDER_SITE_OTHER): Payer: Medicare HMO | Admitting: Pharmacist

## 2019-07-28 DIAGNOSIS — N182 Chronic kidney disease, stage 2 (mild): Secondary | ICD-10-CM

## 2019-07-28 DIAGNOSIS — E1122 Type 2 diabetes mellitus with diabetic chronic kidney disease: Secondary | ICD-10-CM | POA: Diagnosis not present

## 2019-07-28 DIAGNOSIS — Z794 Long term (current) use of insulin: Secondary | ICD-10-CM | POA: Diagnosis not present

## 2019-07-29 ENCOUNTER — Ambulatory Visit: Payer: Self-pay | Admitting: Pharmacist

## 2019-07-29 DIAGNOSIS — N182 Chronic kidney disease, stage 2 (mild): Secondary | ICD-10-CM | POA: Diagnosis not present

## 2019-07-29 DIAGNOSIS — E1122 Type 2 diabetes mellitus with diabetic chronic kidney disease: Secondary | ICD-10-CM | POA: Diagnosis not present

## 2019-07-29 DIAGNOSIS — Z794 Long term (current) use of insulin: Secondary | ICD-10-CM | POA: Diagnosis not present

## 2019-07-29 NOTE — Patient Instructions (Addendum)
Visit Information  Goals Addressed            This Visit's Progress     Patient Stated   . I would like to reduce my A1c  (pt-stated)       Pharmacist Clinical Goal(s):  Marland Kitchen Over the next 90 days, patient will demonstrate Improved medication adherence as evidenced by daily medication adherence, FBG <130 and controlled BGs throughout the day: 06/27/19: Target Goal Date revised to 90 days due to treatment delays secondary to COVID-19  Current Barriers:  . Diabetes: T2DM; most recent A1c 7.9% on 05/05/19 (was 8% on 8/5, 9.1% on 6/2).  Patient states she is adhering to a healthier diet. She has also lost 4 lbs.  She is working to lose more. . Current antihyperglycemic regimen: Tresiba 34 units (this has increased based on recent BGs), Trulicity 1.5mg  weekly o Trulicity dose increased-->new RX sent to to pharmacy on 05/18/19 o Refills called into to pharmacy for DM meds on 06/27/2019 o 07/29/19--pt called, she missed her trulicity dose and was not sure when to take-->instructed patient to take dose today . denies hypoglycemic symptoms; denies hyperglycemic symptoms . Current meal patterns: patient is now mindful of carbohydrates; choosing meat and vegetables over carbs o Encouraged patient to avoid juices/sugary drinks . Current exercise: walking . Current blood glucose readings: FBG<130 . Cardiovascular risk reduction: o Current hypertensive regimen: amlodipine, metoprolol BID o Current hyperlipidemia regimen:  Crestor 20mg  daily (last filled 90 DS on 05/18/19) LDL 81 on 01/2019 o Discussed COVID 19 vaccine with patient   PharmD & CCM RN Interventions: . Comprehensive medication review performed, medication list updated in the EMR.  Reviewed medication fill history via insurance claims data confirming patient appears compliant with having her medications filled on time as prescribed by provider. . Reviewed & discussed the following diabetes-related information with patient: o Follow ADA  recommended "diabetes-friendly" diet  (reviewed healthy snack/food options) o Discussed insulin/GLP-1 injection technique; Patient uses One touch Verio glucometer o Reviewed medication purpose/side effects-->patient denies adverse events  Patient Self Care Activities:  . Patient will check blood glucose daily , document, and provide at future appointments . Patient will focus on medication adherence by continuing to take medications as prescribed . Patient will take medications as prescribed . Patient will contact provider with any episodes of hypoglycemia . Patient will report any questions or concerns to provider    Please see past updates related to this goal by clicking on the "Past Updates" button in the selected goal         The patient verbalized understanding of instructions provided today and declined a print copy of patient instruction materials.   The care management team will reach out to the patient again over the next 30 days.   SIGNATURE 02/2019, PharmD, BCPS Clinical Pharmacist, [facility]  [facility]  Direct Dial: [phone]

## 2019-07-29 NOTE — Patient Instructions (Signed)
Visit Information  Goals Addressed            This Visit's Progress     Patient Stated   . I would like to reduce my A1c  (pt-stated)       Pharmacist Clinical Goal(s):  Marland Kitchen Over the next 90 days, patient will demonstrate Improved medication adherence as evidenced by daily medication adherence, FBG <130 and controlled BGs throughout the day: 06/27/19: Target Goal Date revised to 90 days due to treatment delays secondary to COVID-19  Current Barriers:  . Diabetes: T2DM; most recent A1c 7.9% on 05/05/19 (was 8% on 8/5, 9.1% on 6/2).  Patient states she is adhering to a healthier diet. She has also lost 4 lbs.  She is working to lose more. . Current antihyperglycemic regimen: Tresiba 34 units (this has increased based on recent BGs), Trulicity 1.5mg  weekly o Trulicity dose increased-->new RX sent to to pharmacy on 05/18/19 o Refills called into to pharmacy for DM meds on 06/27/2019 . denies hypoglycemic symptoms; denies hyperglycemic symptoms . Current meal patterns: patient is now mindful of carbohydrates; choosing meat and vegetables over carbs o Encouraged patient to avoid juices/sugary drinks . Current exercise: walking . Current blood glucose readings: FBG<130 . Cardiovascular risk reduction: o Current hypertensive regimen: amlodipine, metoprolol BID o Current hyperlipidemia regimen:  Crestor 20mg  daily (last filled 90 DS on 05/18/19) LDL 81 on 01/2019 o Discussed COVID 19 vaccine with patient   PharmD & CCM RN Interventions: . Comprehensive medication review performed, medication list updated in the EMR.  Reviewed medication fill history via insurance claims data confirming patient appears compliant with having her medications filled on time as prescribed by provider. . Reviewed & discussed the following diabetes-related information with patient: o Follow ADA recommended "diabetes-friendly" diet  (reviewed healthy snack/food options) o Discussed insulin/GLP-1 injection technique; Patient  uses One touch Verio glucometer o Reviewed medication purpose/side effects-->patient denies adverse events  Patient Self Care Activities:  . Patient will check blood glucose daily , document, and provide at future appointments . Patient will focus on medication adherence by continuing to take medications as prescribed . Patient will take medications as prescribed . Patient will contact provider with any episodes of hypoglycemia . Patient will report any questions or concerns to provider    Please see past updates related to this goal by clicking on the "Past Updates" button in the selected goal         The patient verbalized understanding of instructions provided today and declined a print copy of patient instruction materials.   The care management team will reach out to the patient again over the next 30 days.   SIGNATURE 02/2019, PharmD, BCPS Clinical Pharmacist, [facility]  [facility]  Direct Dial: [phone]

## 2019-07-29 NOTE — Progress Notes (Signed)
Chronic Care Management   Visit Note  08/01/2019 Name: [name] [name] MRN: [id number] DOB: 15-Aug-1950  Referred by: [name] Chard, MD Reason for referral : Chronic Care Management (Diabetes)   [name] [name] is a 69 y.o. year old female who is a primary care patient of [name] Chard, MD. The CCM team was consulted for assistance with chronic disease management and care coordination needs related to DMII  Review of patient status, including review of consultants reports, relevant laboratory and other test results, and collaboration with appropriate care team members and the patient's provider was performed as part of comprehensive patient evaluation and provision of chronic care management services.    Incoming call regarding patient's diabetes and chronic care management   Medications: Outpatient Encounter Medications as of 07/29/2019  Medication Sig  . amLODipine (NORVASC) 10 MG tablet TAKE 1 TABLET(10 MG) BY MOUTH DAILY  . aspirin 81 MG tablet Take 81 mg by mouth daily.  . beta carotene 25000 UNIT capsule Take 25,000 Units by mouth daily.  Marland Kitchen BIOTIN PO Take 500 mcg by mouth daily.   . Cholecalciferol ([employer] VIT D 5000 HIGH-POTENCY PO) Take 1 tablet by mouth daily.   . CHROMIUM PICOLINATE PO Take 200 mcg by mouth daily.   . Coenzyme Q10 (COQ-10) 100 MG CAPS Take 1 tablet by mouth daily.   . Cyanocobalamin (VITAMIN B-12 PO) Take 500 mcg by mouth.   . Dulaglutide (TRULICITY) 1.5 KP/5.4SF SOPN Inject 1.5 mg into the skin once a week.  . Flaxseed, Linseed, (FLAXSEED OIL) 1000 MG CAPS Take 1 capsule by mouth daily.   Marland Kitchen glucosamine-chondroitin 500-400 MG tablet Take 1 tablet by mouth daily. Reported on 12/17/2015  . glucose blood (ONETOUCH VERIO) test strip CHECK BLOOD SUGAR THREE TIMES DAILY dx: e11.65  . Insulin Degludec (TRESIBA FLEXTOUCH) 200 UNIT/ML SOPN Inject 26 Units into the skin daily. Max dose is 80 units  . Insulin Pen Needle (BD PEN NEEDLE NANO U/F) 32G X 4 MM MISC USE TO INJECT  INSULIN DAILY  . Krill Oil 350 MG CAPS Take 350 mg by mouth daily.   . magnesium oxide (MAG-OX) 400 MG tablet Take 400 mg by mouth daily.  . metoprolol tartrate (LOPRESSOR) 50 MG tablet TAKE 1 TABLET BY MOUTH TWICE DAILY  . nystatin (NYSTATIN) powder Apply topically 3 (three) times daily. As needed  . omeprazole (PRILOSEC) 20 MG capsule TAKE 1 CAPSULE(20 MG) BY MOUTH DAILY  . ONETOUCH DELICA LANCETS 68L MISC USE AS DIRECTED TO CHECK BLOOD SUGAR THREE TIMES DAILY  . pregabalin (LYRICA) 50 MG capsule TAKE 1 CAPSULE(50 MG) BY MOUTH TWICE DAILY  . rosuvastatin (CRESTOR) 20 MG tablet Take 1 tablet (20 mg total) by mouth daily.  . traZODone (DESYREL) 50 MG tablet TAKE 1 TABLET BY MOUTH EVERY NIGHT AT BEDTIME  . vitamin C (ASCORBIC ACID) 500 MG tablet Take 1,000 mg by mouth 2 (two) times daily.   . Vitamin D, Ergocalciferol, (DRISDOL) 1.25 MG (50000 UT) CAPS capsule Take 1 capsule (50,000 Units total) by mouth every 7 (seven) days.  . vitamin E 200 UNIT capsule Take 200 Units by mouth daily.   No facility-administered encounter medications on file as of 07/29/2019.     Objective:   Goals Addressed            This Visit's Progress     Patient Stated   . I would like to reduce my A1c  (pt-stated)       Pharmacist Clinical Goal(s):  Marland Kitchen Over the  next 90 days, patient will demonstrate Improved medication adherence as evidenced by daily medication adherence, FBG <130 and controlled BGs throughout the day: 06/27/19: Target Goal Date revised to 90 days due to treatment delays secondary to COVID-19  Current Barriers:  . Diabetes: T2DM; most recent A1c 7.9% on 05/05/19 (was 8% on 8/5, 9.1% on 6/2).  Patient states she is adhering to a healthier diet. She has also lost 4 lbs.  She is working to lose more. . Current antihyperglycemic regimen: Tresiba 34 units (this has increased based on recent BGs), Trulicity 1.5mg  weekly o Trulicity dose increased-->new RX sent to to pharmacy on 05/18/19 o Refills  called into to pharmacy for DM meds on 06/27/2019 o 07/29/19--pt called, she missed her trulicity dose and was not sure when to take-->instructed patient to take dose today . denies hypoglycemic symptoms; denies hyperglycemic symptoms . Current meal patterns: patient is now mindful of carbohydrates; choosing meat and vegetables over carbs o Encouraged patient to avoid juices/sugary drinks . Current exercise: walking . Current blood glucose readings: FBG<130 . Cardiovascular risk reduction: o Current hypertensive regimen: amlodipine, metoprolol BID o Current hyperlipidemia regimen:  Crestor 20mg  daily (last filled 90 DS on 05/18/19) LDL 81 on 01/2019 o Discussed COVID 19 vaccine with patient   PharmD & CCM RN Interventions: . Comprehensive medication review performed, medication list updated in the EMR.  Reviewed medication fill history via insurance claims data confirming patient appears compliant with having her medications filled on time as prescribed by provider. . Reviewed & discussed the following diabetes-related information with patient: o Follow ADA recommended "diabetes-friendly" diet  (reviewed healthy snack/food options) o Discussed insulin/GLP-1 injection technique; Patient uses One touch Verio glucometer o Reviewed medication purpose/side effects-->patient denies adverse events  Patient Self Care Activities:  . Patient will check blood glucose daily , document, and provide at future appointments . Patient will focus on medication adherence by continuing to take medications as prescribed . Patient will take medications as prescribed . Patient will contact provider with any episodes of hypoglycemia . Patient will report any questions or concerns to provider    Please see past updates related to this goal by clicking on the "Past Updates" button in the selected goal         Plan:   The care management team will reach out to the patient again over the next 60 days.   Provider  Signature  02/2019, PharmD, BCPS Clinical Pharmacist, [facility]  [facility]  Direct Dial: [phone]

## 2019-07-29 NOTE — Progress Notes (Signed)
Chronic Care Management   Visit Note  07/28/2019 Name: [name] [name] MRN: [id number] DOB: [birth year]  Referred by: [place] Chard, MD Reason for referral : Chronic Care Management (Diabetes)   [name] [name] is a 69 y.o. year old female who is a primary care patient of Glendale Chard, MD. The CCM team was consulted for assistance with chronic disease management and care coordination needs related to DMII  Review of patient status, including review of consultants reports, relevant laboratory and other test results, and collaboration with appropriate care team members and the patient's provider was performed as part of comprehensive patient evaluation and provision of chronic care management services.    Patient was contacted via telephone for today's visit regarding her diabetes and chronic care management   Medications: Outpatient Encounter Medications as of 07/28/2019  Medication Sig  . amLODipine (NORVASC) 10 MG tablet TAKE 1 TABLET(10 MG) BY MOUTH DAILY  . aspirin 81 MG tablet Take 81 mg by mouth daily.  . beta carotene 25000 UNIT capsule Take 25,000 Units by mouth daily.  Marland Kitchen BIOTIN PO Take 500 mcg by mouth daily.   . Cholecalciferol ([employer] VIT D 5000 HIGH-POTENCY PO) Take 1 tablet by mouth daily.   . CHROMIUM PICOLINATE PO Take 200 mcg by mouth daily.   . Coenzyme Q10 (COQ-10) 100 MG CAPS Take 1 tablet by mouth daily.   . Cyanocobalamin (VITAMIN B-12 PO) Take 500 mcg by mouth.   . Dulaglutide (TRULICITY) 1.5 AO/1.3YQ SOPN Inject 1.5 mg into the skin once a week.  . Flaxseed, Linseed, (FLAXSEED OIL) 1000 MG CAPS Take 1 capsule by mouth daily.   Marland Kitchen glucosamine-chondroitin 500-400 MG tablet Take 1 tablet by mouth daily. Reported on 12/17/2015  . glucose blood (ONETOUCH VERIO) test strip CHECK BLOOD SUGAR THREE TIMES DAILY dx: e11.65  . Insulin Degludec (TRESIBA FLEXTOUCH) 200 UNIT/ML SOPN Inject 26 Units into the skin daily. Max dose is 80 units  . Insulin Pen Needle (BD PEN NEEDLE NANO  U/F) 32G X 4 MM MISC USE TO INJECT INSULIN DAILY  . Krill Oil 350 MG CAPS Take 350 mg by mouth daily.   . magnesium oxide (MAG-OX) 400 MG tablet Take 400 mg by mouth daily.  . metoprolol tartrate (LOPRESSOR) 50 MG tablet TAKE 1 TABLET BY MOUTH TWICE DAILY  . nystatin (NYSTATIN) powder Apply topically 3 (three) times daily. As needed  . omeprazole (PRILOSEC) 20 MG capsule TAKE 1 CAPSULE(20 MG) BY MOUTH DAILY  . ONETOUCH DELICA LANCETS 65H MISC USE AS DIRECTED TO CHECK BLOOD SUGAR THREE TIMES DAILY  . pregabalin (LYRICA) 50 MG capsule TAKE 1 CAPSULE(50 MG) BY MOUTH TWICE DAILY  . rosuvastatin (CRESTOR) 20 MG tablet Take 1 tablet (20 mg total) by mouth daily.  . traZODone (DESYREL) 50 MG tablet TAKE 1 TABLET BY MOUTH EVERY NIGHT AT BEDTIME  . vitamin C (ASCORBIC ACID) 500 MG tablet Take 1,000 mg by mouth 2 (two) times daily.   . Vitamin D, Ergocalciferol, (DRISDOL) 1.25 MG (50000 UT) CAPS capsule Take 1 capsule (50,000 Units total) by mouth every 7 (seven) days.  . vitamin E 200 UNIT capsule Take 200 Units by mouth daily.   No facility-administered encounter medications on file as of 07/28/2019.     Objective:   Goals Addressed            This Visit's Progress     Patient Stated   . I would like to reduce my A1c  (pt-stated)       Pharmacist  Clinical Goal(s):  Marland Kitchen Over the next 90 days, patient will demonstrate Improved medication adherence as evidenced by daily medication adherence, FBG <130 and controlled BGs throughout the day: 06/27/19: Target Goal Date revised to 90 days due to treatment delays secondary to COVID-19  Current Barriers:  . Diabetes: T2DM; most recent A1c 7.9% on 05/05/19 (was 8% on 8/5, 9.1% on 6/2).  Patient states she is adhering to a healthier diet. She has also lost 4 lbs.  She is working to lose more. . Current antihyperglycemic regimen: Tresiba 34 units (this has increased based on recent BGs), Trulicity 1.5mg  weekly o Trulicity dose increased-->new RX sent to to  pharmacy on 05/18/19 o Refills called into to pharmacy for DM meds on 06/27/2019 . denies hypoglycemic symptoms; denies hyperglycemic symptoms . Current meal patterns: patient is now mindful of carbohydrates; choosing meat and vegetables over carbs o Encouraged patient to avoid juices/sugary drinks . Current exercise: walking . Current blood glucose readings: FBG<130 . Cardiovascular risk reduction: o Current hypertensive regimen: amlodipine, metoprolol BID o Current hyperlipidemia regimen:  Crestor 20mg  daily (last filled 90 DS on 05/18/19) LDL 81 on 01/2019 o Discussed COVID 19 vaccine with patient   PharmD & CCM RN Interventions: . Comprehensive medication review performed, medication list updated in the EMR.  Reviewed medication fill history via insurance claims data confirming patient appears compliant with having her medications filled on time as prescribed by provider. . Reviewed & discussed the following diabetes-related information with patient: o Follow ADA recommended "diabetes-friendly" diet  (reviewed healthy snack/food options) o Discussed insulin/GLP-1 injection technique; Patient uses One touch Verio glucometer o Reviewed medication purpose/side effects-->patient denies adverse events  Patient Self Care Activities:  . Patient will check blood glucose daily , document, and provide at future appointments . Patient will focus on medication adherence by continuing to take medications as prescribed . Patient will take medications as prescribed . Patient will contact provider with any episodes of hypoglycemia . Patient will report any questions or concerns to provider    Please see past updates related to this goal by clicking on the "Past Updates" button in the selected goal         Plan:   The care management team will reach out to the patient again over the next 30 days.   Next PCP in 2 weeks; Will follow  Provider Signature 02/2019, PharmD, BCPS Clinical  Pharmacist, [facility]  [facility]  Direct Dial: [phone]

## 2019-08-10 ENCOUNTER — Other Ambulatory Visit (HOSPITAL_COMMUNITY)
Admission: RE | Admit: 2019-08-10 | Discharge: 2019-08-10 | Disposition: A | Discharge status: Home / Self Care | Payer: Medicare HMO | Source: Ambulatory Visit | Attending: Internal Medicine | Admitting: Internal Medicine

## 2019-08-10 ENCOUNTER — Encounter: Payer: Self-pay | Admitting: Internal Medicine

## 2019-08-10 ENCOUNTER — Other Ambulatory Visit: Payer: Self-pay

## 2019-08-10 ENCOUNTER — Ambulatory Visit (INDEPENDENT_AMBULATORY_CARE_PROVIDER_SITE_OTHER): Payer: Medicare HMO | Admitting: Internal Medicine

## 2019-08-10 ENCOUNTER — Telehealth: Payer: Medicare HMO | Admitting: Pharmacist

## 2019-08-10 VITALS — BP 126/82 | HR 77 | Temp 98.3°F | Ht 63.5 in | Wt 193.6 lb

## 2019-08-10 DIAGNOSIS — I25119 Atherosclerotic heart disease of native coronary artery with unspecified angina pectoris: Secondary | ICD-10-CM

## 2019-08-10 DIAGNOSIS — R69 Illness, unspecified: Secondary | ICD-10-CM | POA: Diagnosis not present

## 2019-08-10 DIAGNOSIS — I129 Hypertensive chronic kidney disease with stage 1 through stage 4 chronic kidney disease, or unspecified chronic kidney disease: Secondary | ICD-10-CM | POA: Diagnosis not present

## 2019-08-10 DIAGNOSIS — E6609 Other obesity due to excess calories: Secondary | ICD-10-CM | POA: Diagnosis not present

## 2019-08-10 DIAGNOSIS — Z6833 Body mass index (BMI) 33.0-33.9, adult: Secondary | ICD-10-CM

## 2019-08-10 DIAGNOSIS — Z1211 Encounter for screening for malignant neoplasm of colon: Secondary | ICD-10-CM | POA: Diagnosis not present

## 2019-08-10 DIAGNOSIS — R519 Headache, unspecified: Secondary | ICD-10-CM

## 2019-08-10 DIAGNOSIS — Z01419 Encounter for gynecological examination (general) (routine) without abnormal findings: Secondary | ICD-10-CM | POA: Diagnosis present

## 2019-08-10 DIAGNOSIS — K5909 Other constipation: Secondary | ICD-10-CM | POA: Diagnosis not present

## 2019-08-10 DIAGNOSIS — Z794 Long term (current) use of insulin: Secondary | ICD-10-CM

## 2019-08-10 DIAGNOSIS — N182 Chronic kidney disease, stage 2 (mild): Secondary | ICD-10-CM | POA: Diagnosis not present

## 2019-08-10 DIAGNOSIS — E1122 Type 2 diabetes mellitus with diabetic chronic kidney disease: Secondary | ICD-10-CM | POA: Diagnosis not present

## 2019-08-10 DIAGNOSIS — Z780 Asymptomatic menopausal state: Secondary | ICD-10-CM | POA: Diagnosis not present

## 2019-08-10 DIAGNOSIS — I739 Peripheral vascular disease, unspecified: Secondary | ICD-10-CM

## 2019-08-10 DIAGNOSIS — Z1151 Encounter for screening for human papillomavirus (HPV): Secondary | ICD-10-CM | POA: Insufficient documentation

## 2019-08-10 NOTE — Progress Notes (Signed)
This visit occurred during the SARS-CoV-2 public health emergency.  Safety protocols were in place, including screening questions prior to the visit, additional usage of staff PPE, and extensive cleaning of exam room while observing appropriate contact time as indicated for disinfecting solutions.  Subjective:     Patient ID: [name] [name] , female    DOB: 10/05/1950 , 68 y.o.   MRN: [id number]   Chief Complaint  Patient presents with  . Diabetes  . Hypertension    HPI  She is here today for diabetes check. She reports compliance with meds. She admits that she has a liberal diet. She is not exercising.   Diabetes She presents for her follow-up diabetic visit. She has type 2 diabetes mellitus. Her disease course has been improving. Hypoglycemia symptoms include headaches. Pertinent negatives for diabetes include no blurred vision and no chest pain. There are no hypoglycemic complications. Risk factors for coronary artery disease include diabetes mellitus, dyslipidemia, hypertension and post-menopausal. Current diabetic treatment includes insulin injections. She is compliant with treatment most of the time. She is following a diabetic diet. She participates in exercise intermittently. Eye exam is current.  Hypertension This is a chronic problem. The current episode started more than 1 year ago. The problem has been gradually improving since onset. The problem is controlled. Associated symptoms include headaches. Pertinent negatives include no blurred vision, chest pain or palpitations. Past treatments include angiotensin blockers. The current treatment provides moderate improvement. Compliance problems include exercise and diet.  Hypertensive end-organ damage includes kidney disease. Identifiable causes of hypertension include chronic renal disease.     Past Medical History:  Diagnosis Date  . Anemia   . Carpal tunnel syndrome   . Coronary artery disease    DES to ostial RCA 2005  .  Diabetes mellitus without complication (HCC)    No longer taking meds  . Dyslipidemia   . Hypertension   . Osteoarthritis   . PAD (peripheral artery disease) (HCC)   . Sleep apnea      Family History  Problem Relation Age of Onset  . CAD Father 63  . Heart attack Father   . CAD Mother 61  . Heart attack Mother   . CAD Brother   . Cancer Brother        prostate  . CAD Sister   . Cancer Sister        breast  . CAD Brother      Current Outpatient Medications:  .  amLODipine (NORVASC) 10 MG tablet, TAKE 1 TABLET(10 MG) BY MOUTH DAILY, Disp: 90 tablet, Rfl: 1 .  aspirin 81 MG tablet, Take 81 mg by mouth daily., Disp: , Rfl:  .  beta carotene 25000 UNIT capsule, Take 25,000 Units by mouth daily., Disp: , Rfl:  .  BIOTIN PO, Take 500 mcg by mouth daily. , Disp: , Rfl:  .  Cholecalciferol ([employer] VIT D 5000 HIGH-POTENCY PO), Take 1 tablet by mouth daily. , Disp: , Rfl:  .  CHROMIUM PICOLINATE PO, Take 200 mcg by mouth daily. , Disp: , Rfl:  .  Coenzyme Q10 (COQ-10) 100 MG CAPS, Take 1 tablet by mouth daily. , Disp: , Rfl:  .  Cyanocobalamin (VITAMIN B-12 PO), Take 500 mcg by mouth. , Disp: , Rfl:  .  Dulaglutide (TRULICITY) 1.5 MG/0.5ML SOPN, Inject 1.5 mg into the skin once a week., Disp: 12 pen, Rfl: 1 .  Flaxseed, Linseed, (FLAXSEED OIL) 1000 MG CAPS, Take 1 capsule by mouth daily. , Disp: ,   Rfl:  .  glucosamine-chondroitin 500-400 MG tablet, Take 1 tablet by mouth daily. Reported on 12/17/2015, Disp: , Rfl:  .  glucose blood (ONETOUCH VERIO) test strip, CHECK BLOOD SUGAR THREE TIMES DAILY dx: e11.65, Disp: 150 each, Rfl: 11 .  Insulin Degludec (TRESIBA FLEXTOUCH) 200 UNIT/ML SOPN, Inject 26 Units into the skin daily. Max dose is 80 units, Disp: 15 pen, Rfl: 1 .  Insulin Pen Needle (BD PEN NEEDLE NANO U/F) 32G X 4 MM MISC, USE TO INJECT INSULIN DAILY, Disp: 100 each, Rfl: 3 .  Krill Oil 350 MG CAPS, Take 350 mg by mouth daily. , Disp: , Rfl:  .  magnesium oxide (MAG-OX) 400 MG tablet,  Take 400 mg by mouth daily., Disp: , Rfl:  .  metoprolol tartrate (LOPRESSOR) 50 MG tablet, TAKE 1 TABLET BY MOUTH TWICE DAILY, Disp: 180 tablet, Rfl: 4 .  nystatin (NYSTATIN) powder, Apply topically 3 (three) times daily. As needed, Disp: 60 g, Rfl: 0 .  omeprazole (PRILOSEC) 20 MG capsule, TAKE 1 CAPSULE(20 MG) BY MOUTH DAILY, Disp: 30 capsule, Rfl: 1 .  ONETOUCH DELICA LANCETS 33G MISC, USE AS DIRECTED TO CHECK BLOOD SUGAR THREE TIMES DAILY, Disp: 100 each, Rfl: 0 .  pregabalin (LYRICA) 50 MG capsule, TAKE 1 CAPSULE(50 MG) BY MOUTH TWICE DAILY, Disp: 180 capsule, Rfl: 1 .  rosuvastatin (CRESTOR) 20 MG tablet, Take 1 tablet (20 mg total) by mouth daily., Disp: 90 tablet, Rfl: 3 .  traZODone (DESYREL) 50 MG tablet, TAKE 1 TABLET BY MOUTH EVERY NIGHT AT BEDTIME, Disp: 30 tablet, Rfl: 1 .  vitamin C (ASCORBIC ACID) 500 MG tablet, Take 1,000 mg by mouth 2 (two) times daily. , Disp: , Rfl:  .  Vitamin D, Ergocalciferol, (DRISDOL) 1.25 MG (50000 UT) CAPS capsule, Take 1 capsule (50,000 Units total) by mouth every 7 (seven) days., Disp: 12 capsule, Rfl: 1 .  vitamin E 200 UNIT capsule, Take 200 Units by mouth daily., Disp: , Rfl:    Allergies  Allergen Reactions  . Fish Allergy     Cod  . Latex Rash     Review of Systems  Constitutional: Negative.   Eyes: Negative for blurred vision.  Respiratory: Negative.   Cardiovascular: Negative.  Negative for chest pain and palpitations.  Gastrointestinal: Positive for constipation.  Genitourinary:       She c/o blood on toilet paper. She is not sure if it is from vagina or rectum. She does not see any blood in her toilet. She has not noticed any blood in her urine. She denies dysuria.   Neurological: Positive for headaches.       She c/o intermittent frontal headaches. Lasts 4-5 seconds. Described as a stabbing pain. Unable to determine what triggers her sx. Denies associated visual disturbances. Does admit to occ. Sinus congestion.   Psychiatric/Behavioral: Negative.      Today's Vitals   08/10/19 0920  BP: 126/82  Pulse: 77  Temp: 98.3 F (36.8 C)  TempSrc: Oral  Weight: 193 lb 9.6 oz (87.8 kg)  Height: 5' 3.5" (1.613 m)  PainSc: 10-Worst pain ever  PainLoc: Head   Body mass index is 33.76 kg/m.   Objective:  Physical Exam Vitals and nursing note reviewed. Exam conducted with a chaperone present.  Constitutional:      Appearance: Normal appearance.  HENT:     Head: Normocephalic and atraumatic.  Cardiovascular:     Rate and Rhythm: Normal rate and regular rhythm.     Heart sounds:   Normal heart sounds.  Pulmonary:     Effort: Pulmonary effort is normal.     Breath sounds: Normal breath sounds.  Genitourinary:    General: Normal vulva.     Exam position: Lithotomy position.     Tanner stage (genital): 5.     Vagina: Normal.     Cervix: Normal.     Uterus: Normal.      Rectum: Normal. Guaiac result negative.  Skin:    General: Skin is warm.  Neurological:     General: No focal deficit present.     Mental Status: She is alert.  Psychiatric:        Mood and Affect: Mood normal.        Behavior: Behavior normal.         Assessment And Plan:     1. Type 2 diabetes mellitus with stage 2 chronic kidney disease, with long-term current use of insulin (HCC)  Chronic, I will check labs as listed below. Importance of dietary and medication compliance was discussed with the patient.   - BMP8+EGFR - Hemoglobin A1c - Lipid panel  2. Parenchymal renal hypertension, stage 1 through stage 4 or unspecified chronic kidney disease  Chronic, well controlled. She will continue with current meds. She is encouraged to avoid adding salt to her foods.  3. Frontal headache  Possibly due to sinus congestion. She will let me know if her sx persist.  She is encouraged to avoid dairy products for two weeks to see if her sx subside.   4. Peripheral arterial disease (HCC)  Chronic. She is encouraged to  exercise 30 minutes five days per week. Importance of statin compliance was also discussed with the patient.    5. Atherosclerosis of native coronary artery of native heart with angina pectoris (HCC)  Chronic, please see #4. She is also encouraged to follow a heart-healthy diet.   6. Other constipation  She is advised to try Miralax daily. If ineffective, I will consider use of Linzess.  Also advised to increase water and fiber intake.   7. Class 1 obesity due to excess calories with serious comorbidity and body mass index (BMI) of 33.0 to 33.9 in adult  She is encouraged to strive for BMI less than 30 to decrease cardiac risk. She is encouraged to exercise 30 minutes five days per week.   8. Gynecologic exam normal  Pelvic/pap smear performed. Stool is heme negative.   - POC Hemoccult Bld/Stl (1-Cd Office Dx) - Cytology -Pap Smear      Robyn N Sanders, MD    THE PATIENT IS ENCOURAGED TO PRACTICE SOCIAL DISTANCING DUE TO THE COVID-19 PANDEMIC.   

## 2019-08-11 DIAGNOSIS — G4733 Obstructive sleep apnea (adult) (pediatric): Secondary | ICD-10-CM | POA: Diagnosis not present

## 2019-08-11 LAB — BMP8+EGFR
BUN/Creatinine Ratio: 14 (ref 12–28)
BUN: 11 mg/dL (ref 8–27)
CO2: 25 mmol/L (ref 20–29)
Calcium: 9 mg/dL (ref 8.7–10.3)
Chloride: 100 mmol/L (ref 96–106)
Creatinine, Ser: 0.79 mg/dL (ref 0.57–1.00)
GFR calc Af Amer: 89 mL/min/{1.73_m2} (ref >59)
GFR calc non Af Amer: 77 mL/min/{1.73_m2} (ref >59)
Glucose: 230 mg/dL — ABNORMAL HIGH (ref 65–99)
Potassium: 4.1 mmol/L (ref 3.5–5.2)
Sodium: 141 mmol/L (ref 134–144)

## 2019-08-11 LAB — LIPID PANEL
Chol/HDL Ratio: 3 ratio (ref 0.0–4.4)
Cholesterol, Total: 134 mg/dL (ref 100–199)
HDL: 44 mg/dL (ref >39)
LDL Chol Calc (NIH): 62 mg/dL (ref 0–99)
Triglycerides: 169 mg/dL — ABNORMAL HIGH (ref 0–149)
VLDL Cholesterol Cal: 28 mg/dL (ref 5–40)

## 2019-08-11 LAB — HEMOGLOBIN A1C
Est. average glucose Bld gHb Est-mCnc: 186 mg/dL
Hgb A1c MFr Bld: 8.1 % — ABNORMAL HIGH (ref 4.8–5.6)

## 2019-08-12 LAB — POC HEMOCCULT BLD/STL (OFFICE/1-CARD/DIAGNOSTIC)
Card #1 Date: 2172021
Fecal Occult Blood, POC: NEGATIVE

## 2019-08-12 NOTE — Patient Instructions (Signed)

## 2019-08-15 ENCOUNTER — Other Ambulatory Visit: Payer: Self-pay | Admitting: Internal Medicine

## 2019-08-15 LAB — CYTOLOGY - PAP
Comment: NEGATIVE
Diagnosis: NEGATIVE
High risk HPV: NEGATIVE

## 2019-08-23 ENCOUNTER — Other Ambulatory Visit (INDEPENDENT_AMBULATORY_CARE_PROVIDER_SITE_OTHER): Payer: Medicare Other

## 2019-08-23 DIAGNOSIS — K921 Melena: Secondary | ICD-10-CM

## 2019-08-23 LAB — HEMOCCULT GUIAC POC 1CARD (OFFICE)
Card #2 Date: 2212021
Card #2 Fecal Occult Blod, POC: NEGATIVE
Card #3 Date: 2242021
Card #3 Fecal Occult Blood, POC: POSITIVE
Fecal Occult Blood, POC: POSITIVE — AB

## 2019-08-24 ENCOUNTER — Ambulatory Visit (INDEPENDENT_AMBULATORY_CARE_PROVIDER_SITE_OTHER): Payer: Medicare Other

## 2019-08-24 ENCOUNTER — Telehealth: Payer: Self-pay

## 2019-08-24 ENCOUNTER — Other Ambulatory Visit: Payer: Self-pay

## 2019-08-24 DIAGNOSIS — E1122 Type 2 diabetes mellitus with diabetic chronic kidney disease: Secondary | ICD-10-CM

## 2019-08-24 DIAGNOSIS — I10 Essential (primary) hypertension: Secondary | ICD-10-CM

## 2019-08-24 DIAGNOSIS — R195 Other fecal abnormalities: Secondary | ICD-10-CM

## 2019-08-24 NOTE — Telephone Encounter (Signed)
I was calling the pt to let her know that her stool cards were positive for blood on 2 and that Dr. Allyne Gee wants to know who the pt's GI Dr. Is so that she can refer the pt for a f/u. BZ:MCEY positive stool. The pt said that she goes to [facility].

## 2019-08-24 NOTE — Chronic Care Management (AMB) (Signed)
  Chronic Care Management   Outreach Note  08/24/2019 Name: [name] [name] MRN: [id number] DOB: 1951/03/11  Referred by: [name], MD Reason for referral : Chronic Care Management (FU Call - DM, HTN)   An unsuccessful telephone outreach was attempted today. The patient was referred to the case management team for assistance with care management and care coordination.   Follow Up Plan: Telephone follow up appointment with care management team member scheduled for: 09/30/19  [name], RN, BSN, CCM Care Management Coordinator [facility] Management/[facility]  Direct Phone: [phone]

## 2019-08-26 ENCOUNTER — Ambulatory Visit: Payer: Self-pay | Admitting: Pharmacist

## 2019-08-26 DIAGNOSIS — E1122 Type 2 diabetes mellitus with diabetic chronic kidney disease: Secondary | ICD-10-CM

## 2019-08-26 DIAGNOSIS — N182 Chronic kidney disease, stage 2 (mild): Secondary | ICD-10-CM

## 2019-08-26 DIAGNOSIS — I10 Essential (primary) hypertension: Secondary | ICD-10-CM | POA: Diagnosis not present

## 2019-08-26 DIAGNOSIS — Z794 Long term (current) use of insulin: Secondary | ICD-10-CM

## 2019-08-26 NOTE — Chronic Care Management (AMB) (Signed)
Chronic Care Management   Follow Up Note   08/24/2019 Name: [name] [name] MRN: [id number] DOB: 06-Oct-1950  Referred by: Glendale Chard, MD Reason for referral : Chronic Care Management (FU Call - DM, HTN)   [name] [name] is a 69 y.o. year old female who is a primary care patient of Glendale Chard, MD. The CCM team was consulted for assistance with chronic disease management and care coordination needs.    Review of patient status, including review of consultants reports, relevant laboratory and other test results, and collaboration with appropriate care team members and the patient's provider was performed as part of comprehensive patient evaluation and provision of chronic care management services.    SDOH (Social Determinants of Health) assessments performed: Yes See Care Plan activities for detailed interventions related to [place])   Inbound return call received from patient to provide a CCM update.     Outpatient Encounter Medications as of 08/24/2019  Medication Sig  . amLODipine (NORVASC) 10 MG tablet TAKE 1 TABLET(10 MG) BY MOUTH DAILY  . aspirin 81 MG tablet Take 81 mg by mouth daily.  . beta carotene 25000 UNIT capsule Take 25,000 Units by mouth daily.  Marland Kitchen BIOTIN PO Take 500 mcg by mouth daily.   . Cholecalciferol ([employer]S VIT D 5000 HIGH-POTENCY PO) Take 1 tablet by mouth daily.   . CHROMIUM PICOLINATE PO Take 200 mcg by mouth daily.   . Coenzyme Q10 (COQ-10) 100 MG CAPS Take 1 tablet by mouth daily.   . Cyanocobalamin (VITAMIN B-12 PO) Take 500 mcg by mouth.   . Dulaglutide (TRULICITY) 1.5 PQ/9.8YM SOPN Inject 1.5 mg into the skin once a week.  . Flaxseed, Linseed, (FLAXSEED OIL) 1000 MG CAPS Take 1 capsule by mouth daily.   Marland Kitchen glucosamine-chondroitin 500-400 MG tablet Take 1 tablet by mouth daily. Reported on 12/17/2015  . glucose blood (ONETOUCH VERIO) test strip CHECK BLOOD SUGAR THREE TIMES DAILY dx: e11.65  . Insulin Degludec (TRESIBA FLEXTOUCH) 200 UNIT/ML SOPN Inject 26  Units into the skin daily. Max dose is 80 units  . Insulin Pen Needle (BD PEN NEEDLE NANO U/F) 32G X 4 MM MISC USE TO INJECT INSULIN DAILY  . Krill Oil 350 MG CAPS Take 350 mg by mouth daily.   . magnesium oxide (MAG-OX) 400 MG tablet Take 400 mg by mouth daily.  . metoprolol tartrate (LOPRESSOR) 50 MG tablet TAKE 1 TABLET BY MOUTH TWICE DAILY  . nystatin (NYSTATIN) powder Apply topically 3 (three) times daily. As needed  . omeprazole (PRILOSEC) 20 MG capsule TAKE 1 CAPSULE(20 MG) BY MOUTH DAILY  . ONETOUCH DELICA LANCETS 41R MISC USE AS DIRECTED TO CHECK BLOOD SUGAR THREE TIMES DAILY  . pregabalin (LYRICA) 50 MG capsule TAKE 1 CAPSULE(50 MG) BY MOUTH TWICE DAILY  . rosuvastatin (CRESTOR) 20 MG tablet Take 1 tablet (20 mg total) by mouth daily.  . traZODone (DESYREL) 50 MG tablet TAKE 1 TABLET BY MOUTH EVERY NIGHT AT BEDTIME  . vitamin C (ASCORBIC ACID) 500 MG tablet Take 1,000 mg by mouth 2 (two) times daily.   . Vitamin D, Ergocalciferol, (DRISDOL) 1.25 MG (50000 UT) CAPS capsule Take 1 capsule (50,000 Units total) by mouth every 7 (seven) days.  . vitamin E 200 UNIT capsule Take 200 Units by mouth daily.   No facility-administered encounter medications on file as of 08/24/2019.     Objective:  Lab Results  Component Value Date   HGBA1C 8.1 (H) 08/10/2019   HGBA1C 7.9 (H) 05/05/2019  HGBA1C 8.0 (H) 01/26/2019   Lab Results  Component Value Date   MICROALBUR 30 03/23/2019   LDLCALC 62 08/10/2019   CREATININE 0.79 08/10/2019   BP Readings from Last 3 Encounters:  08/10/19 126/82  06/30/19 128/78  06/02/19 112/76    Goals Addressed      Patient Stated   . "I am concerned about having blood in my stool" (pt-stated)       CARE PLAN ENTRY (see longtitudinal plan of care for additional care plan information)  Current Barriers:  Marland Kitchen Knowledge Deficits related to evaluation and treatment for heme positive stool   . Chronic Disease Management support and education needs related to  Impaired Bowel Elimination, DMII, HTN  Nurse Case Manager Clinical Goal(s):  Marland Kitchen Over the next 90 days, patient will work with Dr. Laurence Spates, [facility]  to address needs related to evaluation and treatment for heme positive stool   CCM RN CM Interventions:  08/24/19 call completed with patient . Evaluation of current treatment plan related to Impaired Bowel Elimination and heme positive stool and patient's adherence to plan as established by provider. . Provided education to patient re: potential causes for heme positive stool and standards of care per GI MD recommendations pending MD evaluation and treatment recommendations  . Discussed plans with patient for ongoing care management follow up and provided patient with direct contact information for care management team  Patient Self Care Activities:  . Patient verbalizes understanding of plan to expect a call from Dr. Laurence Spates, [facility]  [phone] . Self administers medications as prescribed . Attends all scheduled provider appointments . Calls pharmacy for medication refills . Performs ADL's independently . Performs IADL's independently . Calls provider office for new concerns or questions  Initial goal documentation     . "I don't want to be Diabetic anymore" (pt-stated)       Current Barriers: CARE PLAN ENTRY (see longtitudinal plan of care for additional care plan information)  . Knowledge Deficits related to disease process and Self Health Management of Diabetes . Chronic Disease Management support and education needs related to DMII, HTN  Nurse Case Manager Clinical Goal(s):  Marland Kitchen Over the next 90 days, patient will work with RN CM to address needs related to weight loss plan and Diabetes 11/17/18: Target goal date revised to 90 days secondary to COVID-19 treatment delays -12/22/18 Goal Met . Over the next 30 days, patient will have increased understanding of what foods to eat per recommendations provided by the American  Diabetic Association Goal Met  . 08/24/19 New Over the next 90 days, patient will verbalize improved adherence with following her diabetic diet by eating/drinking fewer carbohydrates and sugary foods   . 08/24/19 New Over the next 90 days, patient will achieve daily glycemic FBS within range of 80-130 and < 190 after meals      CCM RN CM Interventions: 12/22/18 call completed with patient . Evaluation of current treatment plan related to Diabetes and patient's adherence to plan as established by provider. . Provided education to patient re: recent A1C obtained on 08/10/19 is up to 8.1; Educated patient on target A1C <7.0 to help prevent diabetes associated complications; discussed CBG daily glycemic target range: 80-130 mg/dL before meals and <180 mg/dL after meals; Education provided related to hypoglycemia management "15-15 rule"; Determined patient is checking FBS daily; Determined patient needs a new Ophthalmologist in order to complete her yearly eye exam . Reviewed medications with patient and discussed indication, dosage and  frequency of prescribed medications, patient verbalizes understanding and adherence, denies noted SE  . Collaborated with embedded Pharm D Lottie Dawson  regarding medication review and consideration to switch patient to [place] or Synjardy due to uncontrolled DM and hx of CAD with stent placement  . Sent in basket message to embedded BSW Daneen Schick with request to assist patient with locating providers for Ophthalmology and Dental needs . Provided patient with printed  educational materials related to Diabetes Management with Meal Planning; Diabetes Zone Safety Tool; Carb Choices; Carb Counting . Advised patient, providing education and rationale, to check cbg 1-2 times daily and record, calling the CCM team and or PCP for findings outside established parameters . Discussed plans with patient for ongoing care management follow up and provided patient with direct contact  information for care management team  Patient Self Care Activities:  . Self administers medications as prescribed . Attends all scheduled provider appointments . Calls pharmacy for medication refills . Performs ADL's independently . Performs IADL's independently . Calls provider office for new concerns or questions  Please see past updates related to this goal by clicking on the "Past Updates" button in the selected goal     . COMPLETED: I would like to review my medications for drug interactions (pt-stated)       Current Barriers:  Marland Kitchen Knowledge Deficits related to pharmacotherapy  Pharmacist Clinical Goal(s):  Marland Kitchen Over the next 60 days, polypharmacy will be reduced as evidenced by decreasing the number of vitamins/supplements patient is taking 11/17/18: Target Goal Date revised to 60 days due to treatment delays secondary to COVID-19 Goal Met   CCM RN CM Interventions: 08/24/19: completed call with patient   Comprehensive medication review performed  Assessed for questions/concerns related to prescribed medication regimen - pt denies at this time  Assessed for good effectiveness from medications w/o SE - pt denies at this time  Discussed plans with patient for ongoing care management follow up and provided patient with direct contact information for care management team  Patient Self Care Activities:  . Attends all scheduled provider appointments . Calls pharmacy for medication refills . Calls provider office for new concerns or questions  Please see past updates related to this goal by clicking on the "Past Updates" button in the selected goal       Telephone follow up appointment with care management team member scheduled for: 09/30/19  Barb Merino, RN, BSN, CCM Care Management Coordinator [place] [facility]  Direct Phone: [phone]

## 2019-08-26 NOTE — Patient Instructions (Signed)
Visit Information  Goals Addressed            This Visit's Progress     Patient Stated   . "I am concerned about having blood in my stool" (pt-stated)       CARE PLAN ENTRY (see longtitudinal plan of care for additional care plan information)  Current Barriers:  Marland Kitchen Knowledge Deficits related to evaluation and treatment for heme positive stool   . Chronic Disease Management support and education needs related to Impaired Bowel Elimination, DMII, HTN  Nurse Case Manager Clinical Goal(s):  Marland Kitchen Over the next 90 days, patient will work with Dr. Laurence Spates, [facility]  to address needs related to evaluation and treatment for heme positive stool   CCM RN CM Interventions:  08/24/19 call completed with patient . Evaluation of current treatment plan related to Impaired Bowel Elimination and heme positive stool and patient's adherence to plan as established by provider. . Provided education to patient re: potential causes for heme positive stool and standards of care per GI MD recommendations pending MD evaluation and treatment recommendations  . Discussed plans with patient for ongoing care management follow up and provided patient with direct contact information for care management team  Patient Self Care Activities:  . Patient verbalizes understanding of plan to expect a call from Dr. Laurence Spates, [facility]  [phone] . Self administers medications as prescribed . Attends all scheduled provider appointments . Calls pharmacy for medication refills . Performs ADL's independently . Performs IADL's independently . Calls provider office for new concerns or questions  Initial goal documentation     . "I don't want to be Diabetic anymore" (pt-stated)       Current Barriers: CARE PLAN ENTRY (see longtitudinal plan of care for additional care plan information)  . Knowledge Deficits related to disease process and Self Health Management of Diabetes . Chronic Disease Management support and  education needs related to DMII, HTN  Nurse Case Manager Clinical Goal(s):  Marland Kitchen Over the next 90 days, patient will work with RN CM to address needs related to weight loss plan and Diabetes 11/17/18: Target goal date revised to 90 days secondary to COVID-19 treatment delays -12/22/18 Goal Met . Over the next 30 days, patient will have increased understanding of what foods to eat per recommendations provided by the American Diabetic Association Goal Met  . 08/24/19 New Over the next 90 days, patient will verbalize improved adherence with following her diabetic diet by eating/drinking fewer carbohydrates and sugary foods   . 08/24/19 New Over the next 90 days, patient will achieve daily glycemic FBS within range of 80-130 and < 190 after meals      CCM RN CM Interventions: 12/22/18 call completed with patient . Evaluation of current treatment plan related to Diabetes and patient's adherence to plan as established by provider. . Provided education to patient re: recent A1C obtained on 08/10/19 is up to 8.1; Educated patient on target A1C <7.0 to help prevent diabetes associated complications; discussed CBG daily glycemic target range: 80-130 mg/dL before meals and <180 mg/dL after meals; Education provided related to hypoglycemia management "15-15 rule"; Determined patient is checking FBS daily; Determined patient needs a new Ophthalmologist in order to complete her yearly eye exam . Reviewed medications with patient and discussed indication, dosage and frequency of prescribed medications, patient verbalizes understanding and adherence, denies noted SE  . Collaborated with embedded Pharm D Lottie Dawson  regarding medication review and consideration to switch patient to [facility]  or [facility] due to uncontrolled DM and hx of CAD with stent placement  . Sent in basket message to embedded BSW Daneen Schick with request to assist patient with locating providers for Ophthalmology and Dental needs . Provided  patient with printed  educational materials related to Diabetes Management with Meal Planning; Diabetes Zone Safety Tool; Carb Choices; Carb Counting . Advised patient, providing education and rationale, to check cbg 1-2 times daily and record, calling the CCM team and or PCP for findings outside established parameters . Discussed plans with patient for ongoing care management follow up and provided patient with direct contact information for care management team  Patient Self Care Activities:  . Self administers medications as prescribed . Attends all scheduled provider appointments . Calls pharmacy for medication refills . Performs ADL's independently . Performs IADL's independently . Calls provider office for new concerns or questions  Please see past updates related to this goal by clicking on the "Past Updates" button in the selected goal      . COMPLETED: I would like to review my medications for drug interactions (pt-stated)       Current Barriers:  Marland Kitchen Knowledge Deficits related to pharmacotherapy  Pharmacist Clinical Goal(s):  Marland Kitchen Over the next 60 days, polypharmacy will be reduced as evidenced by decreasing the number of vitamins/supplements patient is taking 11/17/18: Target Goal Date revised to 60 days due to treatment delays secondary to COVID-19 Goal Met   CCM RN CM Interventions: 08/24/19: completed call with patient   Comprehensive medication review performed  Assessed for questions/concerns related to prescribed medication regimen - pt denies at this time  Assessed for good effectiveness from medications w/o SE - pt denies at this time  Discussed plans with patient for ongoing care management follow up and provided patient with direct contact information for care management team  Patient Self Care Activities:  . Attends all scheduled provider appointments . Calls pharmacy for medication refills . Calls provider office for new concerns or questions  Please see past  updates related to this goal by clicking on the "Past Updates" button in the selected goal         Patient verbalizes understanding of instructions provided today.   Telephone follow up appointment with care management team member scheduled for: 09/30/19  Barb Merino, RN, BSN, CCM Care Management Coordinator [facility]  Direct Phone: [phone]

## 2019-09-01 NOTE — Progress Notes (Signed)
  Chronic Care Management   Outreach Note  08/26/2019 Name: [name] [name] MRN: [id number] DOB: 1950-10-06  Referred by: [name], MD Reason for referral : Chronic Care Management and Diabetes   An unsuccessful telephone outreach was attempted today. The patient was referred to the case management team for assistance with care management and care coordination.  Patient's A1c has increased to 8.1%.  Collaboration with RN CM and PCP to discuss future steps and adjust therapy as appropriated.  Follow Up Plan: A HIPPA compliant phone message was left for the patient providing contact information and requesting a return call.  The care management team will reach out to the patient again over the next 5-7 days.   SIGNATURE Kieth Brightly, PharmD, BCPS Clinical Pharmacist, [facility]  [facility]  Direct Dial: [phone]

## 2019-09-02 ENCOUNTER — Ambulatory Visit: Payer: Self-pay

## 2019-09-02 ENCOUNTER — Telehealth: Payer: Self-pay

## 2019-09-02 DIAGNOSIS — E1122 Type 2 diabetes mellitus with diabetic chronic kidney disease: Secondary | ICD-10-CM

## 2019-09-02 NOTE — Chronic Care Management (AMB) (Signed)
  Chronic Care Management   Outreach Note  09/02/2019 Name: [name] [name] MRN: [id number] DOB: 1950-09-08  Referred by: [name], MD Reason for referral : Care Coordination   SW received communication from RN Case Manager requesting assistance with locating a dental provider for the patient. SW placed an unsuccessful outbound call to the patient to assist with care coordination needs as requested by RN Case Manager. Unfortunately, the patient did not answer with no option to leave a voice message.  Follow Up Plan: The care management team will reach out to the patient again over the next 14 days.     Bevelyn Ngo, BSW, CDP Social Worker, Certified Dementia Practitioner [facility] Care Management [phone]

## 2019-09-06 ENCOUNTER — Ambulatory Visit: Payer: Self-pay | Admitting: Pharmacist

## 2019-09-06 DIAGNOSIS — E1122 Type 2 diabetes mellitus with diabetic chronic kidney disease: Secondary | ICD-10-CM

## 2019-09-08 ENCOUNTER — Encounter: Payer: Self-pay | Admitting: Internal Medicine

## 2019-09-08 ENCOUNTER — Other Ambulatory Visit: Payer: Self-pay

## 2019-09-08 ENCOUNTER — Ambulatory Visit: Payer: Self-pay

## 2019-09-08 ENCOUNTER — Telehealth: Payer: Self-pay

## 2019-09-08 ENCOUNTER — Other Ambulatory Visit: Payer: Self-pay | Admitting: Internal Medicine

## 2019-09-08 DIAGNOSIS — J029 Acute pharyngitis, unspecified: Secondary | ICD-10-CM

## 2019-09-08 DIAGNOSIS — E1122 Type 2 diabetes mellitus with diabetic chronic kidney disease: Secondary | ICD-10-CM

## 2019-09-08 DIAGNOSIS — I10 Essential (primary) hypertension: Secondary | ICD-10-CM

## 2019-09-08 DIAGNOSIS — Z794 Long term (current) use of insulin: Secondary | ICD-10-CM

## 2019-09-08 MED ORDER — ACCU-CHEK FASTCLIX LANCETS MISC: 2 refills | Status: DC

## 2019-09-08 NOTE — Patient Instructions (Signed)
Visit Information  Goals Addressed            This Visit's Progress     Patient Stated   . I would like to reduce my A1c  (pt-stated)       Pharmacist Clinical Goal(s):  Marland Kitchen Over the next 90 days, patient will demonstrate Improved medication adherence as evidenced by daily medication adherence, FBG <130 and controlled BGs throughout the day: 06/27/19: Target Goal Date revised to 90 days due to treatment delays secondary to COVID-19  Current Barriers:  . Diabetes: T2DM; most recent A1c 7.9% on 05/05/19 (was 8% on 8/5, 9.1% on 6/2).  Patient states she is adhering to a healthier diet. She has also lost 4 lbs.  She is working to lose more. . Current antihyperglycemic regimen: Tresiba 34 units (this has increased based on recent BGs), Trulicity 1.5mg  weekly o Trulicity dose increased-->new RX sent to to pharmacy on 05/18/19 o Refills called into to pharmacy for DM meds on 06/27/2019 o 07/29/19--pt called, she missed her trulicity dose and was not sure when to take-->instructed patient to take dose today o Will initiate SGLT2-farxiga 10mg  at upcoming PCP appt on 09/12/19--discussed with PCP . denies hypoglycemic symptoms; denies hyperglycemic symptoms . Current meal patterns: patient is now mindful of carbohydrates; choosing meat and vegetables over carbs o Encouraged patient to avoid juices/sugary drinks . Current exercise: walking . Current blood glucose readings: FBG<130 . Cardiovascular risk reduction: o Current hypertensive regimen: amlodipine, metoprolol BID o Current hyperlipidemia regimen:  Crestor 20mg  daily (last filled 90 DS on 05/18/19) LDL 81 on 01/2019 o Discussed COVID 19 vaccine with patient   PharmD & CCM RN Interventions: . Comprehensive medication review performed, medication list updated in the EMR.  Reviewed medication fill history via insurance claims data confirming patient appears compliant with having her medications filled on time as prescribed by provider. . Reviewed &  discussed the following diabetes-related information with patient: o Follow ADA recommended "diabetes-friendly" diet  (reviewed healthy snack/food options) o Discussed insulin/GLP-1 injection technique; Patient uses One touch Verio glucometer o Reviewed medication purpose/side effects-->patient denies adverse events  Patient Self Care Activities:  . Patient will check blood glucose daily , document, and provide at future appointments . Patient will focus on medication adherence by continuing to take medications as prescribed . Patient will take medications as prescribed . Patient will contact provider with any episodes of hypoglycemia . Patient will report any questions or concerns to provider    Please see past updates related to this goal by clicking on the "Past Updates" button in the selected goal         The patient verbalized understanding of instructions provided today and declined a print copy of patient instruction materials.   The care management team will reach out to the patient again over the next 5 days.   SIGNATURE 05/20/19, PharmD, BCPS Clinical Pharmacist, [facility]  [facility]  Direct Dial: [phone]

## 2019-09-08 NOTE — Progress Notes (Signed)
Chronic Care Management    Visit Note  09/05/2019 Name: [name] [name] MRN: [id number] DOB: [birth year]  Referred by: Glendale Chard, MD Reason for referral : Chronic Care Management and Diabetes   [name] [name] is a 69 y.o. year old female who is a primary care patient of Glendale Chard, MD. The CCM team was consulted for assistance with chronic disease management and care coordination needs related to DMII  Review of patient status, including review of consultants reports, relevant laboratory and other test results, and collaboration with appropriate care team members and the patient's provider was performed as part of comprehensive patient evaluation and provision of chronic care management services.    SDOH (Social Determinants of Health) assessments performed: No See Care Plan activities for detailed interventions related to SDOH     Medications: Outpatient Encounter Medications as of 09/06/2019  Medication Sig  . amLODipine (NORVASC) 10 MG tablet TAKE 1 TABLET(10 MG) BY MOUTH DAILY  . aspirin 81 MG tablet Take 81 mg by mouth daily.  . beta carotene 25000 UNIT capsule Take 25,000 Units by mouth daily.  Marland Kitchen BIOTIN PO Take 500 mcg by mouth daily.   . Cholecalciferol ([employer] VIT D 5000 HIGH-POTENCY PO) Take 1 tablet by mouth daily.   . CHROMIUM PICOLINATE PO Take 200 mcg by mouth daily.   . Coenzyme Q10 (COQ-10) 100 MG CAPS Take 1 tablet by mouth daily.   . Cyanocobalamin (VITAMIN B-12 PO) Take 500 mcg by mouth.   . Dulaglutide (TRULICITY) 1.5 SJ/6.2EZ SOPN Inject 1.5 mg into the skin once a week.  . Flaxseed, Linseed, (FLAXSEED OIL) 1000 MG CAPS Take 1 capsule by mouth daily.   Marland Kitchen glucosamine-chondroitin 500-400 MG tablet Take 1 tablet by mouth daily. Reported on 12/17/2015  . glucose blood (ONETOUCH VERIO) test strip CHECK BLOOD SUGAR THREE TIMES DAILY dx: e11.65  . Insulin Degludec (TRESIBA FLEXTOUCH) 200 UNIT/ML SOPN Inject 26 Units into the skin daily. Max dose is 80 units  . Insulin  Pen Needle (BD PEN NEEDLE NANO U/F) 32G X 4 MM MISC USE TO INJECT INSULIN DAILY  . Krill Oil 350 MG CAPS Take 350 mg by mouth daily.   . magnesium oxide (MAG-OX) 400 MG tablet Take 400 mg by mouth daily.  . metoprolol tartrate (LOPRESSOR) 50 MG tablet TAKE 1 TABLET BY MOUTH TWICE DAILY  . nystatin (NYSTATIN) powder Apply topically 3 (three) times daily. As needed  . omeprazole (PRILOSEC) 20 MG capsule TAKE 1 CAPSULE(20 MG) BY MOUTH DAILY  . ONETOUCH DELICA LANCETS 66Q MISC USE AS DIRECTED TO CHECK BLOOD SUGAR THREE TIMES DAILY  . pregabalin (LYRICA) 50 MG capsule TAKE 1 CAPSULE(50 MG) BY MOUTH TWICE DAILY  . rosuvastatin (CRESTOR) 20 MG tablet Take 1 tablet (20 mg total) by mouth daily.  . traZODone (DESYREL) 50 MG tablet TAKE 1 TABLET BY MOUTH EVERY NIGHT AT BEDTIME  . vitamin C (ASCORBIC ACID) 500 MG tablet Take 1,000 mg by mouth 2 (two) times daily.   . Vitamin D, Ergocalciferol, (DRISDOL) 1.25 MG (50000 UT) CAPS capsule Take 1 capsule (50,000 Units total) by mouth every 7 (seven) days.  . vitamin E 200 UNIT capsule Take 200 Units by mouth daily.   No facility-administered encounter medications on file as of 09/06/2019.     Objective:   Goals Addressed            This Visit's Progress     Patient Stated   . I would like to reduce my A1c  (pt-stated)  Pharmacist Clinical Goal(s):  Marland Kitchen Over the next 90 days, patient will demonstrate Improved medication adherence as evidenced by daily medication adherence, FBG <130 and controlled BGs throughout the day: 06/27/19: Target Goal Date revised to 90 days due to treatment delays secondary to COVID-19  Current Barriers:  . Diabetes: T2DM; most recent A1c 7.9% on 05/05/19 (was 8% on 8/5, 9.1% on 6/2).  Patient states she is adhering to a healthier diet. She has also lost 4 lbs.  She is working to lose more. . Current antihyperglycemic regimen: Tresiba 34 units (this has increased based on recent BGs), Trulicity 1.5mg  weekly o Trulicity dose  increased-->new RX sent to to pharmacy on 05/18/19 o Refills called into to pharmacy for DM meds on 06/27/2019 o 07/29/19--pt called, she missed her trulicity dose and was not sure when to take-->instructed patient to take dose today o Will initiate SGLT2-farxiga 10mg  at upcoming PCP appt on 09/12/19--discussed with PCP . denies hypoglycemic symptoms; denies hyperglycemic symptoms . Current meal patterns: patient is now mindful of carbohydrates; choosing meat and vegetables over carbs o Encouraged patient to avoid juices/sugary drinks . Current exercise: walking . Current blood glucose readings: FBG<130 . Cardiovascular risk reduction: o Current hypertensive regimen: amlodipine, metoprolol BID o Current hyperlipidemia regimen:  Crestor 20mg  daily (last filled 90 DS on 05/18/19) LDL 81 on 01/2019 o Discussed COVID 19 vaccine with patient   PharmD & CCM RN Interventions: . Comprehensive medication review performed, medication list updated in the EMR.  Reviewed medication fill history via insurance claims data confirming patient appears compliant with having her medications filled on time as prescribed by provider. . Reviewed & discussed the following diabetes-related information with patient: o Follow ADA recommended "diabetes-friendly" diet  (reviewed healthy snack/food options) o Discussed insulin/GLP-1 injection technique; Patient uses One touch Verio glucometer o Reviewed medication purpose/side effects-->patient denies adverse events  Patient Self Care Activities:  . Patient will check blood glucose daily , document, and provide at future appointments . Patient will focus on medication adherence by continuing to take medications as prescribed . Patient will take medications as prescribed . Patient will contact provider with any episodes of hypoglycemia . Patient will report any questions or concerns to provider    Please see past updates related to this goal by clicking on the "Past  Updates" button in the selected goal          Plan:   Face to Face appointment with care management team member scheduled for: 3.22.21  Provider Signature   02/2019, PharmD, BCPS Clinical Pharmacist, [facility]  [facility]  Direct Dial: [phone]

## 2019-09-09 ENCOUNTER — Telehealth: Payer: Self-pay

## 2019-09-09 ENCOUNTER — Ambulatory Visit: Payer: Medicare Other | Attending: Internal Medicine

## 2019-09-09 ENCOUNTER — Ambulatory Visit: Payer: Self-pay

## 2019-09-09 DIAGNOSIS — N182 Chronic kidney disease, stage 2 (mild): Secondary | ICD-10-CM

## 2019-09-09 DIAGNOSIS — I10 Essential (primary) hypertension: Secondary | ICD-10-CM

## 2019-09-09 DIAGNOSIS — E1122 Type 2 diabetes mellitus with diabetic chronic kidney disease: Secondary | ICD-10-CM

## 2019-09-09 DIAGNOSIS — Z20822 Contact with and (suspected) exposure to covid-19: Secondary | ICD-10-CM

## 2019-09-09 NOTE — Chronic Care Management (AMB) (Signed)
Chronic Care Management   Follow Up Note   09/08/2019 Name: [name] [name] MRN: [id number] DOB: 1951/06/23  Referred by: Glendale Chard, MD Reason for referral : Chronic Care Management (FU Inbound Call-DM/anxiety/fatigue)   [name] [name] is a 69 y.o. year old female who is a primary care patient of Glendale Chard, MD. The CCM team was consulted for assistance with chronic disease management and care coordination needs.    Review of patient status, including review of consultants reports, relevant laboratory and other test results, and collaboration with appropriate care team members and the patient's provider was performed as part of comprehensive patient evaluation and provision of chronic care management services.    SDOH (Social Determinants of Health) assessments performed: Yes See Care Plan activities for detailed interventions related to [name] [name])   Inbound call received from patient stating she doesn't feel well and her blood sugars have been elevated.     Outpatient Encounter Medications as of 09/08/2019  Medication Sig  . amLODipine (NORVASC) 10 MG tablet TAKE 1 TABLET(10 MG) BY MOUTH DAILY  . aspirin 81 MG tablet Take 81 mg by mouth daily.  . beta carotene 25000 UNIT capsule Take 25,000 Units by mouth daily.  Marland Kitchen BIOTIN PO Take 500 mcg by mouth daily.   . Cholecalciferol ([employer] VIT D 5000 HIGH-POTENCY PO) Take 1 tablet by mouth daily.   . CHROMIUM PICOLINATE PO Take 200 mcg by mouth daily.   . Coenzyme Q10 (COQ-10) 100 MG CAPS Take 1 tablet by mouth daily.   . Cyanocobalamin (VITAMIN B-12 PO) Take 500 mcg by mouth.   . Dulaglutide (TRULICITY) 1.5 LY/6.5KP SOPN Inject 1.5 mg into the skin once a week.  . Flaxseed, Linseed, (FLAXSEED OIL) 1000 MG CAPS Take 1 capsule by mouth daily.   Marland Kitchen glucosamine-chondroitin 500-400 MG tablet Take 1 tablet by mouth daily. Reported on 12/17/2015  . glucose blood (ONETOUCH VERIO) test strip CHECK BLOOD SUGAR THREE TIMES DAILY dx: e11.65  . Insulin  Degludec (TRESIBA FLEXTOUCH) 200 UNIT/ML SOPN Inject 26 Units into the skin daily. Max dose is 80 units  . Insulin Pen Needle (BD PEN NEEDLE NANO U/F) 32G X 4 MM MISC USE TO INJECT INSULIN DAILY  . Krill Oil 350 MG CAPS Take 350 mg by mouth daily.   . magnesium oxide (MAG-OX) 400 MG tablet Take 400 mg by mouth daily.  . metoprolol tartrate (LOPRESSOR) 50 MG tablet TAKE 1 TABLET BY MOUTH TWICE DAILY  . nystatin (NYSTATIN) powder Apply topically 3 (three) times daily. As needed  . omeprazole (PRILOSEC) 20 MG capsule TAKE 1 CAPSULE(20 MG) BY MOUTH DAILY  . ONETOUCH DELICA LANCETS 54S MISC USE AS DIRECTED TO CHECK BLOOD SUGAR THREE TIMES DAILY  . pregabalin (LYRICA) 50 MG capsule TAKE 1 CAPSULE(50 MG) BY MOUTH TWICE DAILY  . rosuvastatin (CRESTOR) 20 MG tablet Take 1 tablet (20 mg total) by mouth daily.  . traZODone (DESYREL) 50 MG tablet TAKE 1 TABLET BY MOUTH EVERY NIGHT AT BEDTIME  . vitamin C (ASCORBIC ACID) 500 MG tablet Take 1,000 mg by mouth 2 (two) times daily.   . Vitamin D, Ergocalciferol, (DRISDOL) 1.25 MG (50000 UT) CAPS capsule Take 1 capsule (50,000 Units total) by mouth every 7 (seven) days.  . vitamin E 200 UNIT capsule Take 200 Units by mouth daily.   No facility-administered encounter medications on file as of 09/08/2019.     Objective:  Lab Results  Component Value Date   HGBA1C 8.1 (H) 08/10/2019   HGBA1C 7.9 (  H) 05/05/2019   HGBA1C 8.0 (H) 01/26/2019   Lab Results  Component Value Date   MICROALBUR 30 03/23/2019   LDLCALC 62 08/10/2019   CREATININE 0.79 08/10/2019   BP Readings from Last 3 Encounters:  08/10/19 126/82  06/30/19 128/78  06/02/19 112/76    Goals Addressed      Patient Stated   . "I am concerned about having blood in my stool" (pt-stated)       CARE PLAN ENTRY (see longtitudinal plan of care for additional care plan information)  Current Barriers:  Marland Kitchen Knowledge Deficits related to evaluation and treatment for heme positive stool   . Chronic  Disease Management support and education needs related to Impaired Bowel Elimination, DMII, HTN  Nurse Case Manager Clinical Goal(s):  Marland Kitchen Over the next 90 days, patient will work with Dr. Laurence [name], [facility]  to address needs related to evaluation and treatment for heme positive stool   CCM RN CM Interventions:  09/08/19 call completed with patient . Evaluation of current treatment plan related to Impaired Bowel Elimination and heme positive stool and patient's adherence to plan as established by provider . Determined patient continues to have anxiety since having a heme positive stool, she is using her faith to help her cope but admits she is having increased anxiety  . Determined the referral to Gastroenterology with Dr. Alferd [name] GI is pending  . Discussed plans with patient for ongoing care management follow up and provided patient with direct contact information for care management team  Patient Self Care Activities:  . Patient verbalizes understanding of plan to expect a call from Dr. Laurence [name], [facility]  [phone] . Self administers medications as prescribed . Attends all scheduled provider appointments . Calls pharmacy for medication refills . Performs ADL's independently . Performs IADL's independently . Calls provider office for new concerns or questions  Please see past updates related to this goal by clicking on the "Past Updates" button in the selected goal      . "I don't want to be Diabetic anymore" (pt-stated)       Current Barriers: CARE PLAN ENTRY (see longtitudinal plan of care for additional care plan information)  . Knowledge Deficits related to disease process and Self Health Management of Diabetes . Chronic Disease Management support and education needs related to DMII, HTN  Nurse Case Manager Clinical Goal(s):  Marland Kitchen Over the next 90 days, patient will work with RN CM to address needs related to weight loss plan and Diabetes 11/17/18: Target goal  date revised to 90 days secondary to COVID-19 treatment delays -12/22/18 Goal Met . Over the next 30 days, patient will have increased understanding of what foods to eat per recommendations provided by the American Diabetic Association Goal Met  . 08/24/19 New Over the next 30 days, patient will verbalize improved adherence with following her diabetic diet by eating/drinking fewer carbohydrates and sugary foods   . 08/24/19 New Over the next 90 days, patient will achieve daily glycemic FBS within range of 80-130 and < 190 after meals      CCM RN CM Interventions: 09/08/19 call completed with patient . Evaluation of current treatment plan related to Diabetes and patient's adherence to plan as established by provider. . Provided education to patient re: recent A1C obtained on 08/10/19 is up to 8.1; Educated patient on target A1C <7.0 to help prevent diabetes associated complications; discussed CBG daily glycemic target range: 80-130 mg/dL before meals and <180 mg/dL after meals;  Education provided related to hypoglycemia management "15-15 rule"; Determined patient is checking FBS daily; Determined patient needs a new Ophthalmologist in order to complete her yearly eye exam . Reviewed medications with patient and advised embedded Pharm D Lottie Dawson will be contacting her to discuss a change with her current diabetic treatment plan  . Collaborated with embedded Pharm D Lottie Dawson via in basket message to advise [name] [name] will be in the office on 09/12/19 to f/u with Dr. Baird [name], reply received advising Almyra Free has scheduled a face to face visit with [name] [name] to review her new DM txt plan . Advised patient, providing education and rationale, to check cbg 1-2 times daily and record, calling the CCM team and or PCP for findings outside established parameters; FBS 80-130; <190 after meals . Discussed plans with patient for ongoing care management follow up and provided patient with direct contact  information for care management team  Patient Self Care Activities:  . Self administers medications as prescribed . Attends all scheduled provider appointments . Calls pharmacy for medication refills . Performs ADL's independently . Performs IADL's independently . Calls provider office for new concerns or questions  Please see past updates related to this goal by clicking on the "Past Updates" button in the selected goal      . "I need help finding a eye doctor and a foot doctor" (pt-stated)       [place] (see longtitudinal plan of care for additional care plan information)  Current Barriers:  Marland Kitchen Knowledge Deficits related to access provider list to help locate Eye and Foot Specialist  . Chronic Disease Management support and education needs related to DMII, HTN   Nurse Case Manager Clinical Goal(s):  Marland Kitchen Over the next 30 days, patient will work with the CCM team to address needs related to assistance locating in Development worker, community for Ophthalmologist and [place] RN CM Interventions:  09/08/19 spoke with patient . Evaluation of current treatment plan related to ongoing health maintenance for Diabetic Retinal and Podiatry care and patient's adherence to plan as established by provider. . Advised patient to contact her health plan to determine in network providers and benefits offered for these services . Collaborated with embedded [place]  regarding follow up and assistance to patient with locating in network providers for Eye and Foot care . Discussed plans with patient for ongoing care management follow up and provided patient with direct contact information for care management team  Patient Self Care Activities:  . Self administers medications as prescribed . Attends all scheduled provider appointments . Calls pharmacy for medication refills . Performs ADL's independently . Performs IADL's independently . Calls provider office for new concerns or  questions  Initial goal documentation     . "I would like to feel better and have less anxiety about my health" (pt-stated)       [place] (see longtitudinal plan of care for additional care plan information)  Current Barriers:  Marland Kitchen Knowledge Deficits related to evaluation and treatment of anxiety and symptoms of illness of unknown origin  . Chronic Disease Management support and education needs related to DMII, HTN  Nurse Case Manager Clinical Goal(s):  Marland Kitchen Over the next 30 days, patient will work with PCP to address needs related to worsening anxiety related to declining health and symptoms of illness of unknown origin  CCM RN CM Interventions:  09/08/19 Inbound call completed with patient  . Evaluation of current treatment plan  related to anxiety and acute symptoms of illness and patient's adherence to plan as established by provider . Determined patient has been more anxious since learning she had a heme positive stool; Determined she feel's being isolated for so long due to [place], is also contributing to her anxiety and feelings of despair; Determined over the past couple of days, she has been feeling more fatigued and is experiencing body aches and is having increased blood glucose levels in the 200's . Determined patient has no symptoms of fever, chest pain, shortness of breath, nausea, vomiting or diarrhea but states "I just don't feel good" . Determined patient has completed both COVID 19 vaccines but would like to have a COVID test to "ease my mind" . Provided patient with active listening and validated her feelings of concern . Encouraged patient to use prayer and meditation to help her cope with her anxiety; encouraged to also use deep breathing exercises  . Instructed patient to stay well hydrated and to balance her activity with rest; continue to monitor for fever and continue to monitor her CBG's twice daily . Collaborated with PCP, Dr. Baird [name] regarding patient's c/o and  her request to get tested for COVID 19; advised Dr. Baird [name] this RN CM was able to schedule [name] [name] for an OV with Dr. Baird [name] per the patient's request for this upcoming Monday, 09/12/19 at 9:15 AM . Sent in basket message to embedded Pharm D Lottie Dawson with an update regarding patient's scheduled OV with Dr. Baird [name] set for Monday, 09/12/19 at 9:15 AM, reply received from [place] confirming she will f/u with [name] [name] at this time  . Discussed plans with patient for ongoing care management follow up and provided patient with direct contact information for care management team . Sent in basket message to [facility] with an update regarding patient's feeling of isolation and anxiety; requested Tillie Rung provide resources to [name] [name] regarding Senior activities she may be interested in at next f/u call   Patient Self Care Activities:  . Self administers medications as prescribed . Attends all scheduled provider appointments . Calls pharmacy for medication refills . Performs ADL's independently . Performs IADL's independently . Calls provider office for new concerns or questions  Initial goal documentation        Plan:   Telephone follow up appointment with care management team member scheduled for:09/30/19  Barb Merino, RN, BSN, CCM Care Management Coordinator [place] [facility]  Direct Phone: [phone]

## 2019-09-09 NOTE — Chronic Care Management (AMB) (Signed)
  Chronic Care Management   Outreach Note  09/09/2019 Name: [name] [name] MRN: [id number] DOB: 21-May-1951  Referred by: [name], MD Reason for referral : Care Coordination   SW placed a second unsuccessful outbound call to the patient to assist with care coordination needs. SW left a HIPAA compliant voice message requesting a return call.  Follow Up Plan: The care management team will reach out to the patient again over the next 14 days.   Bevelyn Ngo, BSW, CDP Social Worker, Certified Dementia Practitioner [facility] Care Management [phone]

## 2019-09-09 NOTE — Patient Instructions (Signed)
Visit Information  Goals Addressed      Patient Stated   . "I am concerned about having blood in my stool" (pt-stated)       CARE PLAN ENTRY (see longtitudinal plan of care for additional care plan information)  Current Barriers:  Marland Kitchen Knowledge Deficits related to evaluation and treatment for heme positive stool   . Chronic Disease Management support and education needs related to Impaired Bowel Elimination, DMII, HTN  Nurse Case Manager Clinical Goal(s):  Marland Kitchen Over the next 90 days, patient will work with Dr. Laurence Spates, [facility]  to address needs related to evaluation and treatment for heme positive stool   CCM RN CM Interventions:  09/08/19 call completed with patient . Evaluation of current treatment plan related to Impaired Bowel Elimination and heme positive stool and patient's adherence to plan as established by provider . Determined patient continues to have anxiety since having a heme positive stool, she is using her faith to help her cope but admits she is having increased anxiety  . Determined the referral to Gastroenterology with Dr. Alferd Apa GI is pending  . Discussed plans with patient for ongoing care management follow up and provided patient with direct contact information for care management team  Patient Self Care Activities:  . Patient verbalizes understanding of plan to expect a call from Dr. Laurence Spates, [facility]  [phone] . Self administers medications as prescribed . Attends all scheduled provider appointments . Calls pharmacy for medication refills . Performs ADL's independently . Performs IADL's independently . Calls provider office for new concerns or questions  Please see past updates related to this goal by clicking on the "Past Updates" button in the selected goal      . "I don't want to be Diabetic anymore" (pt-stated)       Current Barriers: CARE PLAN ENTRY (see longtitudinal plan of care for additional care plan  information)  . Knowledge Deficits related to disease process and Self Health Management of Diabetes . Chronic Disease Management support and education needs related to DMII, HTN  Nurse Case Manager Clinical Goal(s):  Marland Kitchen Over the next 90 days, patient will work with RN CM to address needs related to weight loss plan and Diabetes 11/17/18: Target goal date revised to 90 days secondary to COVID-19 treatment delays -12/22/18 Goal Met . Over the next 30 days, patient will have increased understanding of what foods to eat per recommendations provided by the American Diabetic Association Goal Met  . 08/24/19 New Over the next 30 days, patient will verbalize improved adherence with following her diabetic diet by eating/drinking fewer carbohydrates and sugary foods   . 08/24/19 New Over the next 90 days, patient will achieve daily glycemic FBS within range of 80-130 and < 190 after meals      CCM RN CM Interventions: 09/08/19 call completed with patient . Evaluation of current treatment plan related to Diabetes and patient's adherence to plan as established by provider. . Provided education to patient re: recent A1C obtained on 08/10/19 is up to 8.1; Educated patient on target A1C <7.0 to help prevent diabetes associated complications; discussed CBG daily glycemic target range: 80-130 mg/dL before meals and <180 mg/dL after meals; Education provided related to hypoglycemia management "15-15 rule"; Determined patient is checking FBS daily; Determined patient needs a new Ophthalmologist in order to complete her yearly eye exam . Reviewed medications with patient and advised embedded Pharm D Lottie Dawson will be contacting her to discuss a change with  her current diabetic treatment plan  . Collaborated with embedded Pharm D Lottie Dawson via in basket message to advise Ms. Picinich will be in the office on 09/12/19 to f/u with Dr. Baird Cancer, reply received advising Almyra Free has scheduled a face to face visit with Ms.  Hucker to review her new DM txt plan . Advised patient, providing education and rationale, to check cbg 1-2 times daily and record, calling the CCM team and or PCP for findings outside established parameters; FBS 80-130; <190 after meals . Discussed plans with patient for ongoing care management follow up and provided patient with direct contact information for care management team  Patient Self Care Activities:  . Self administers medications as prescribed . Attends all scheduled provider appointments . Calls pharmacy for medication refills . Performs ADL's independently . Performs IADL's independently . Calls provider office for new concerns or questions  Please see past updates related to this goal by clicking on the "Past Updates" button in the selected goal      . "I need help finding a eye doctor and a foot doctor" (pt-stated)       [place] (see longtitudinal plan of care for additional care plan information)  Current Barriers:  Marland Kitchen Knowledge Deficits related to access provider list to help locate Eye and Foot Specialist  . Chronic Disease Management support and education needs related to DMII, HTN   Nurse Case Manager Clinical Goal(s):  Marland Kitchen Over the next 30 days, patient will work with the CCM team to address needs related to assistance locating in Development worker, community for Ophthalmologist and [place] RN CM Interventions:  09/08/19 spoke with patient . Evaluation of current treatment plan related to ongoing health maintenance for Diabetic Retinal and Podiatry care and patient's adherence to plan as established by provider. . Advised patient to contact her health plan to determine in network providers and benefits offered for these services . Collaborated with embedded [place]  regarding follow up and assistance to patient with locating in network providers for Eye and Foot care . Discussed plans with patient for ongoing care management follow up and  provided patient with direct contact information for care management team  Patient Self Care Activities:  . Self administers medications as prescribed . Attends all scheduled provider appointments . Calls pharmacy for medication refills . Performs ADL's independently . Performs IADL's independently . Calls provider office for new concerns or questions  Initial goal documentation     . "I would like to feel better and have less anxiety about my health" (pt-stated)       [place] (see longtitudinal plan of care for additional care plan information)  Current Barriers:  Marland Kitchen Knowledge Deficits related to evaluation and treatment of anxiety and symptoms of illness of unknown origin  . Chronic Disease Management support and education needs related to DMII, HTN  Nurse Case Manager Clinical Goal(s):  Marland Kitchen Over the next 30 days, patient will work with PCP to address needs related to worsening anxiety related to declining health and symptoms of illness of unknown origin  CCM RN CM Interventions:  09/08/19 Inbound call completed with patient  . Evaluation of current treatment plan related to anxiety and acute symptoms of illness and patient's adherence to plan as established by provider . Determined patient has been more anxious since learning she had a heme positive stool; Determined she feel's being isolated for so long due to [place], is also contributing to her anxiety  and feelings of despair; Determined over the past couple of days, she has been feeling more fatigued and is experiencing body aches and is having increased blood glucose levels in the 200's . Determined patient has no symptoms of fever, chest pain, shortness of breath, nausea, vomiting or diarrhea but states "I just don't feel good" . Determined patient has completed both COVID 19 vaccines but would like to have a COVID test to "ease my mind" . Provided patient with active listening and validated her feelings of  concern . Encouraged patient to use prayer and meditation to help her cope with her anxiety; encouraged to also use deep breathing exercises  . Instructed patient to stay well hydrated and to balance her activity with rest; continue to monitor for fever and continue to monitor her CBG's twice daily . Collaborated with PCP, Dr. Baird Cancer regarding patient's c/o and her request to get tested for COVID 19; advised Dr. Baird Cancer this RN CM was able to schedule Ms. Bloom for an OV with Dr. Baird Cancer per the patient's request for this upcoming Monday, 09/12/19 at 9:15 AM . Sent in basket message to embedded Pharm D Lottie Dawson with an update regarding patient's scheduled OV with Dr. Baird Cancer set for Monday, 09/12/19 at 9:15 AM, reply received from [place] confirming she will f/u with Ms. Ostenson at this time  . Discussed plans with patient for ongoing care management follow up and provided patient with direct contact information for care management team . Sent in basket message to [facility] with an update regarding patient's feeling of isolation and anxiety; requested Tillie Rung provide resources to Ms. [place] regarding Senior activities she may be interested in at next f/u call   Patient Self Care Activities:  . Self administers medications as prescribed . Attends all scheduled provider appointments . Calls pharmacy for medication refills . Performs ADL's independently . Performs IADL's independently . Calls provider office for new concerns or questions  Initial goal documentation       Patient verbalizes understanding of instructions provided today.   Telephone follow up appointment with care management team member scheduled for: 09/30/19 Barb Merino, RN, BSN, CCM Care Management Coordinator [place] [facility]  Direct Phone: [phone]

## 2019-09-10 LAB — NOVEL CORONAVIRUS, NAA: SARS-CoV-2, NAA: NOT DETECTED

## 2019-09-12 ENCOUNTER — Other Ambulatory Visit: Payer: Self-pay

## 2019-09-12 ENCOUNTER — Telehealth: Payer: Self-pay

## 2019-09-12 ENCOUNTER — Encounter: Payer: Self-pay | Admitting: Internal Medicine

## 2019-09-12 ENCOUNTER — Ambulatory Visit (INDEPENDENT_AMBULATORY_CARE_PROVIDER_SITE_OTHER): Payer: Medicare Other | Admitting: Internal Medicine

## 2019-09-12 ENCOUNTER — Ambulatory Visit: Payer: Self-pay

## 2019-09-12 VITALS — BP 122/84 | HR 75 | Temp 98.5°F | Ht 63.5 in | Wt 191.2 lb

## 2019-09-12 DIAGNOSIS — E1122 Type 2 diabetes mellitus with diabetic chronic kidney disease: Secondary | ICD-10-CM | POA: Diagnosis not present

## 2019-09-12 DIAGNOSIS — R5383 Other fatigue: Secondary | ICD-10-CM

## 2019-09-12 DIAGNOSIS — Z794 Long term (current) use of insulin: Secondary | ICD-10-CM

## 2019-09-12 DIAGNOSIS — G4733 Obstructive sleep apnea (adult) (pediatric): Secondary | ICD-10-CM

## 2019-09-12 DIAGNOSIS — N182 Chronic kidney disease, stage 2 (mild): Secondary | ICD-10-CM | POA: Diagnosis not present

## 2019-09-12 DIAGNOSIS — M2550 Pain in unspecified joint: Secondary | ICD-10-CM | POA: Diagnosis not present

## 2019-09-12 DIAGNOSIS — E6609 Other obesity due to excess calories: Secondary | ICD-10-CM

## 2019-09-12 DIAGNOSIS — Z6833 Body mass index (BMI) 33.0-33.9, adult: Secondary | ICD-10-CM

## 2019-09-12 DIAGNOSIS — I10 Essential (primary) hypertension: Secondary | ICD-10-CM

## 2019-09-12 MED ORDER — FREESTYLE LIBRE 14 DAY READER DEVI: 1.0000 | Freq: Three times a day (TID) | 2 refills | Status: DC

## 2019-09-12 MED ORDER — FREESTYLE LIBRE 14 DAY SENSOR MISC: 2 refills | Status: DC

## 2019-09-12 MED ORDER — ACCU-CHEK FASTCLIX LANCETS MISC: 2 refills | Status: DC

## 2019-09-12 NOTE — Patient Instructions (Signed)
Social Worker Visit Information  Goals we discussed today:  Goals Addressed            This Visit's Progress     Patient Stated   . COMPLETED: "I need help finding a eye doctor and a foot doctor" (pt-stated)       CARE PLAN ENTRY (see longtitudinal plan of care for additional care plan information)  Current Barriers:  Marland Kitchen Knowledge Deficits related to access provider list to help locate Eye and Foot Specialist  . Chronic Disease Management support and education needs related to DMII, HTN   Nurse Case Manager Clinical Goal(s):  Marland Kitchen Over the next 30 days, patient will work with the CCM team to address needs related to assistance locating in Risk manager for Ophthalmologist and Podiatry Care  CCM SW Interventions Completed 09/12/2019 . Inbound call from the patient returning SW call . SW reviewed reason for call indicating RN Case Manager requesting SW intervention to assist with scheduling  . Determined the patient was wanting assistance with identifying a dentist to assist with lower dentures and a diabetic eye exam o The patient reports she had a dental appointment on 09/12/19 to perform x-rays and is awaiting insurance approval o Advised the patient to contact SW if needed pending outcome of insurance claim . Discussed the patient has dual coverage and needs to complete a diabetic eye exam o Advised the patient SW would request a referral from the patients primary provider o Collaboration with Dr. Allyne Gee regarding referral for diabetic eye exam  Patient Self Care Activities:  . Self administers medications as prescribed . Attends all scheduled provider appointments . Calls pharmacy for medication refills . Performs ADL's independently . Performs IADL's independently . Calls provider office for new concerns or questions  Please see past updates related to this goal by clicking on the "Past Updates" button in the selected goal          Follow Up Plan: No SW follow up  planned at this time. Please contact me as needed.   Bevelyn Ngo, BSW, CDP Social Worker, Certified Dementia Practitioner [facility] [phone]

## 2019-09-12 NOTE — Chronic Care Management (AMB) (Signed)
  Chronic Care Management   Outreach Note  09/12/2019 Name: [name] [name] MRN: [id number] DOB: 01-Jan-1951  Referred by: [name], MD Reason for referral : Care Coordination   SW placed an unsuccessful outbound call to the patient in an effort to return a call missed by the patient. SW left a HIPAA compliant voice message requesting a return call.  Follow Up Plan: The care management team will reach out to the patient again over the next 10 days.   Bevelyn Ngo, BSW, CDP Social Worker, Certified Dementia Practitioner [facility] [phone]

## 2019-09-12 NOTE — Patient Instructions (Addendum)
Correction Insulin  Correction insulin, also called corrective insulin or a supplemental dose, is a small amount of insulin that can be used to lower your blood sugar (glucose) if it is too high. You may be instructed to check your blood glucose at certain times of the day and to use correction insulin as needed to lower your blood glucose to your target range. Correction insulin is primarily used as part of diabetes management. It may also be prescribed for people who do not have diabetes. What is a correction scale? A correction scale, also called a sliding scale, is prescribed by your health care provider to help you determine when you need correction insulin. Your correction scale is based on your individual treatment goals, and it has two parts:  Ranges of blood glucose levels.  How much correction insulin to give yourself if your blood sugar falls within a certain range. If your blood glucose is in your desired range, you will not need correction insulin and you should take your normal insulin dose. What type of insulin do I need? Your health care provider may prescribe rapid-acting or short-acting insulin for you to use as correction insulin. Rapid-acting insulin:  Starts working quickly, in as little as 5 minutes.  Can last for 3-6 hours.  Works well when taken right before a meal to quickly lower blood glucose. Short-acting insulin:  Starts working in about 30 minutes.  Can last for 6-8 hours.  Should be taken about 30 minutes before you start eating a meal. Talk with your health care provider or pharmacist about which type of correction insulin to take and when to take it. If you use insulin to control your diabetes, you should use correction insulin in addition to the longer-acting (basal) insulin that you normally use. How do I manage my blood glucose with correction insulin? Giving a correction dose  Check your blood glucose as directed by your health care provider.  Use  your correction scale to find the range that your blood glucose is in.  Identify the units of insulin that match your blood glucose range.  Make sure you have food available that you can eat in the next 15-30 minutes, after your correction dose.  Give yourself the dose of correction insulin that your health care provider has prescribed in your correction scale. Always make sure you are using the right type of insulin. ? If your correction insulin is rapid-acting, start eating a meal within 15 minutes after your correction dose to keep your blood glucose from getting too low. ? If your correction insulin is short-acting, start eating a meal within 30 minutes after your correction dose to keep your blood glucose from getting too low. Keeping a blood glucose log   Write down your blood glucose test results and the amount of insulin that you give yourself. Do this every time you check blood glucose or take insulin. Bring this log with you to your medical visits. This information will help your health care provider to manage your medicines.  Note anything that may affect your blood glucose, such as: ? Changes in normal exercise or activity. ? Changes in your normal schedule, such as changes in your sleep routine, going on vacation, changing your diet, or holidays. ? New over-the-counter or prescription medicines. ? Illness, stress, or anxiety. ? Changes in the time that you took your medicine or insulin. ? Changes in your meals, such as skipping a meal, having a late meal, or dining out. ? Eating  things that may affect blood glucose, such as snacks, meal portions that are larger than normal, drinks that contain sugar, or eating less than usual. What do I need to know about hyperglycemia and hypoglycemia? What is hyperglycemia? Hyperglycemia, also called high blood glucose, occurs when blood glucose is too high. Make sure you know the early signs of hyperglycemia, such as:  Increased  thirst.  Hunger.  Feeling very tired.  Needing to urinate more often than usual.  Blurry vision. What is hypoglycemia? Hypoglycemia is also called low blood glucose. Be aware of "stacking" your insulin doses. This happens when you correct a high blood glucose by giving yourself extra insulin too soon after a previous correction dose or mealtime dose. This may cause you to have too much insulin in your body and may put you at risk for hypoglycemia. Hypoglycemia occurs with a blood glucose level at or below 70 mg/dL (3.9 mmol/L). It is important to know the symptoms of hypoglycemia and treat it right away. Always have a 15-gram rapid-acting carbohydrate snack with you to treat low blood glucose. Family members and close friends should also know the symptoms and should understand how to treat hypoglycemia, in case you are not able to treat yourself. What are the symptoms of hypoglycemia? Hypoglycemia symptoms can include:  Hunger.  Anxiety.  Sweating and feeling clammy.  Confusion.  Dizziness or light-headedness.  Sleepiness.  Nausea.  Increased heart rate.  Headache.  Blurry vision.  Jerky movements that you cannot control (seizure).  Nightmares.  Tingling or numbness around the mouth, lips, or tongue.  A change in speech.  Decreased ability to concentrate.  A change in coordination.  Restless sleep.  Tremors or shakes.  Fainting.  Irritability. How do I treat hypoglycemia? If you are alert and able to swallow safely, follow the 15:15 rule:  Take 15 grams of a rapid-acting carbohydrate. Rapid-acting options include: ? 1 tube of glucose gel. ? 3 glucose pills. ? 6-8 pieces of hard candy. ? 4 oz (120 mL) of fruit juice. ? 4 oz (120 mL) of regular (not diet) soda.  Check your blood glucose 15 minutes after you take the carbohydrate. ? If the repeat blood glucose level is still at or below 70 mg/dL (3.9 mmol/L), take 15 grams of a carbohydrate again. ? If  your blood glucose level does not increase above 70 mg/dL (3.9 mmol/L) after 3 tries, seek emergency medical care.  After your blood glucose level returns to normal, eat a meal or a snack within 1 hour.  How do I treat severe hypoglycemia? Severe hypoglycemia is when your blood glucose level is at or below 54 mg/dL (3 mmol/L). Severe hypoglycemia is an emergency. Do not wait to see if the symptoms will go away. Get medical help right away. Call your local emergency services (911 in the [place] Do not drive yourself to the hospital. If you have severe hypoglycemia and you cannot eat or drink, you may need an injection of glucagon. A family member or close friend should learn how to check your blood glucose and how to give you a glucagon injection. Ask your health care provider if you need to have an emergency glucagon injection kit available. Severe hypoglycemia may need to be treated in a hospital. The treatment may include getting glucose through an IV tube. You may also need treatment for the cause of your hypoglycemia. Why do I need correction insulin if I do not have diabetes? If you do not have diabetes, your  health care provider may prescribe insulin because:  Keeping your blood glucose in the target range is important for your overall health.  You are taking medicines that cause your blood glucose to be higher than normal. Contact a health care provider if:  You develop low blood glucose that you are not able to treat yourself.  You have high blood glucose that you are not able to correct with correction insulin.  Your blood glucose is often too low.  You used emergency glucagon to treat low blood glucose. Get help right away if:  You become unresponsive. If this happens, someone else should call emergency services (911 in the [place] right away.  Your blood glucose is lower than 54 mg/dL (3.0 mmol/L).  You become confused or you have trouble thinking clearly.  You have difficulty  breathing. Summary  Correction insulin is a small amount of insulin that can be used to lower your blood sugar (glucose) if it is too high.  Talk with your health care provider or pharmacist about which type of correction insulin to take and when to take it. If you use insulin to control your diabetes, you should use correction insulin in addition to the longer-acting (basal) insulin that you normally use.  You may be instructed to check your blood glucose at certain times of the day and to use correction insulin as needed to lower your blood glucose to your target range. Always keep a log of your blood glucose values and the amount of insulin that you used.  It is important to know the symptoms of hypoglycemia and treat it right away. Always have a 15-gram rapid-acting carbohydrate snack with you to treat low blood glucose. Family members and close friends should also know the symptoms and should understand how to treat hypoglycemia, in case you are not able to treat yourself. This information is not intended to replace advice given to you by your health care provider. Make sure you discuss any questions you have with your health care provider. Document Revised: 06/19/2016 Document Reviewed: 03/07/2016 Elsevier Patient Education  [place]

## 2019-09-12 NOTE — Chronic Care Management (AMB) (Signed)
Chronic Care Management    Social Work Follow Up Note  09/12/2019 Name: [name] [name] MRN: [id number] DOB: 22-Oct-1950  [name] [name] is a 69 y.o. year old female who is a primary care patient of [name], MD. The CCM team was consulted for assistance with care coordination.   Review of patient status, including review of consultants reports, other relevant assessments, and collaboration with appropriate care team members and the patient's provider was performed as part of comprehensive patient evaluation and provision of chronic care management services.    SW received a return call from the patient in response to voice message left by SW.  SDOH (Social Determinants of Health) assessments performed: No    Outpatient Encounter Medications as of 09/12/2019  Medication Sig  . Accu-Chek FastClix Lancets MISC CHECK BLOOD SUGAR THREE TIMES DAILY dx: e11.65  . amLODipine (NORVASC) 10 MG tablet TAKE 1 TABLET(10 MG) BY MOUTH DAILY  . aspirin 81 MG tablet Take 81 mg by mouth daily.  . beta carotene 64403 UNIT capsule Take 25,000 Units by mouth daily.  Marland Kitchen BIOTIN PO Take 500 mcg by mouth daily.   . Cholecalciferol ([employer] VIT D 5000 HIGH-POTENCY PO) Take 1 tablet by mouth daily.   . CHROMIUM PICOLINATE PO Take 200 mcg by mouth daily.   . Coenzyme Q10 (COQ-10) 100 MG CAPS Take 1 tablet by mouth daily.   . Continuous Blood Gluc Receiver (FREESTYLE LIBRE 14 DAY READER) DEVI Inject 1 each into the skin in the morning, at noon, and at bedtime.  . Continuous Blood Gluc Sensor (FREESTYLE LIBRE 14 DAY SENSOR) MISC Use as directed to check blood sugars 3 times per day dx: e11.65  . Cyanocobalamin (VITAMIN B-12 PO) Take 500 mcg by mouth.   . Dulaglutide (TRULICITY) 1.5 MG/0.5ML SOPN Inject 1.5 mg into the skin once a week.  Marland Kitchen glucosamine-chondroitin 500-400 MG tablet Take 1 tablet by mouth daily. Reported on 12/17/2015  . glucose blood (ONETOUCH VERIO) test strip CHECK BLOOD SUGAR THREE TIMES DAILY dx:  e11.65  . Insulin Degludec (TRESIBA FLEXTOUCH) 200 UNIT/ML SOPN Inject 26 Units into the skin daily. Max dose is 80 units  . Insulin Pen Needle (BD PEN NEEDLE NANO U/F) 32G X 4 MM MISC USE TO INJECT INSULIN DAILY  . Krill Oil 350 MG CAPS Take 350 mg by mouth daily.   . magnesium oxide (MAG-OX) 400 MG tablet Take 400 mg by mouth daily.  . metoprolol tartrate (LOPRESSOR) 50 MG tablet TAKE 1 TABLET BY MOUTH TWICE DAILY  . nystatin (NYSTATIN) powder Apply topically 3 (three) times daily. As needed  . omeprazole (PRILOSEC) 20 MG capsule TAKE 1 CAPSULE(20 MG) BY MOUTH DAILY  . ONETOUCH DELICA LANCETS 33G MISC USE AS DIRECTED TO CHECK BLOOD SUGAR THREE TIMES DAILY  . pregabalin (LYRICA) 50 MG capsule TAKE 1 CAPSULE(50 MG) BY MOUTH TWICE DAILY  . rosuvastatin (CRESTOR) 20 MG tablet Take 1 tablet (20 mg total) by mouth daily.  . traZODone (DESYREL) 50 MG tablet TAKE 1 TABLET BY MOUTH EVERY NIGHT AT BEDTIME  . vitamin C (ASCORBIC ACID) 500 MG tablet Take 1,000 mg by mouth 2 (two) times daily.   . Vitamin D, Ergocalciferol, (DRISDOL) 1.25 MG (50000 UT) CAPS capsule Take 1 capsule (50,000 Units total) by mouth every 7 (seven) days.  . vitamin E 200 UNIT capsule Take 200 Units by mouth daily.   No facility-administered encounter medications on file as of 09/12/2019.     Goals Addressed  This Visit's Progress     Patient Stated   . COMPLETED: "I need help finding a eye doctor and a foot doctor" (pt-stated)       [place] (see longtitudinal plan of care for additional care plan information)  Current Barriers:  Marland Kitchen Knowledge Deficits related to access provider list to help locate Eye and Foot Specialist  . Chronic Disease Management support and education needs related to DMII, HTN   Nurse Case Manager Clinical Goal(s):  Marland Kitchen Over the next 30 days, patient will work with the CCM team to address needs related to assistance locating in Development worker, community for Ophthalmologist and [place] SW Interventions Completed 09/12/2019 . Inbound call from the patient returning SW call . SW reviewed reason for call indicating RN Case Manager requesting SW intervention to assist with scheduling  . Determined the patient was wanting assistance with identifying a dentist to assist with lower dentures and a diabetic eye exam o The patient reports she had a dental appointment on 09/12/19 to perform x-rays and is awaiting insurance approval o Advised the patient to contact SW if needed pending outcome of insurance claim . Discussed the patient has dual coverage and needs to complete a diabetic eye exam o Advised the patient SW would request a referral from the patients primary provider o Collaboration with Dr. Baird Cancer regarding referral for diabetic eye exam  Patient Self Care Activities:  . Self administers medications as prescribed . Attends all scheduled provider appointments . Calls pharmacy for medication refills . Performs ADL's independently . Performs IADL's independently . Calls provider office for new concerns or questions  Please see past updates related to this goal by clicking on the "Past Updates" button in the selected goal          Follow Up Plan: No SW follow up planned at this time. The patient is encouraged to contact SW with future resource needs.   Daneen Schick, BSW, CDP Social Worker, Certified Dementia Practitioner [place] [facility] [place] [facility] [phone]  Total time spent performing care coordination and/or care management activities with the patient by phone or face to face = 11 minutes.

## 2019-09-14 ENCOUNTER — Other Ambulatory Visit: Payer: Self-pay

## 2019-09-14 ENCOUNTER — Ambulatory Visit: Payer: Self-pay | Admitting: Pharmacist

## 2019-09-14 ENCOUNTER — Other Ambulatory Visit: Payer: Self-pay | Admitting: Internal Medicine

## 2019-09-14 ENCOUNTER — Telehealth: Payer: Self-pay

## 2019-09-14 ENCOUNTER — Ambulatory Visit: Payer: Self-pay

## 2019-09-14 DIAGNOSIS — N182 Chronic kidney disease, stage 2 (mild): Secondary | ICD-10-CM | POA: Diagnosis not present

## 2019-09-14 DIAGNOSIS — I10 Essential (primary) hypertension: Secondary | ICD-10-CM

## 2019-09-14 DIAGNOSIS — E1122 Type 2 diabetes mellitus with diabetic chronic kidney disease: Secondary | ICD-10-CM | POA: Diagnosis not present

## 2019-09-14 DIAGNOSIS — Z794 Long term (current) use of insulin: Secondary | ICD-10-CM | POA: Diagnosis not present

## 2019-09-14 NOTE — Telephone Encounter (Signed)
Trazodone refill 

## 2019-09-15 ENCOUNTER — Telehealth: Payer: Self-pay

## 2019-09-15 ENCOUNTER — Other Ambulatory Visit: Payer: Self-pay | Admitting: Internal Medicine

## 2019-09-15 NOTE — Progress Notes (Signed)
Chronic Care Management   Visit Note  09/14/2019 Name: [name] [name] MRN: [id number] DOB: 1950-11-04  Referred by: Glendale Chard, MD Reason for referral : Chronic Care Management and Diabetes   [name] [name] is a 69 y.o. year old female who is a primary care patient of Glendale Chard, MD. The CCM team was consulted for assistance with chronic disease management and care coordination needs related to DMII  Review of patient status, including review of consultants reports, relevant laboratory and other test results, and collaboration with appropriate care team members and the patient's provider was performed as part of comprehensive patient evaluation and provision of chronic care management services.    SDOH (Social Determinants of Health) assessments performed: No See Care Plan activities for detailed interventions related to SDOH     Medications: Outpatient Encounter Medications as of 09/14/2019  Medication Sig  . Insulin Aspart, w/Niacinamide, (FIASP FLEXTOUCH [place]) Inject into the skin. Use sliding scale as directed three times daily. Inject 2 units for BG 150-199, 4 units for BG 200-249, 6 units for BG 250-299, 8 units for BG 300-340, 10 units for BG>350, BG>400 call MD  . Accu-Chek FastClix Lancets MISC CHECK BLOOD SUGAR THREE TIMES DAILY dx: e11.65  . amLODipine (NORVASC) 10 MG tablet TAKE 1 TABLET(10 MG) BY MOUTH DAILY  . aspirin 81 MG tablet Take 81 mg by mouth daily.  . beta carotene 25000 UNIT capsule Take 25,000 Units by mouth daily.  Marland Kitchen BIOTIN PO Take 500 mcg by mouth daily.   . Cholecalciferol (CVS VIT D 5000 HIGH-POTENCY PO) Take 1 tablet by mouth daily.   . CHROMIUM PICOLINATE PO Take 200 mcg by mouth daily.   . Coenzyme Q10 (COQ-10) 100 MG CAPS Take 1 tablet by mouth daily.   . Continuous Blood Gluc Receiver (FREESTYLE LIBRE 14 DAY READER) DEVI Inject 1 each into the skin in the morning, at noon, and at bedtime.  . Continuous Blood Gluc Sensor (FREESTYLE LIBRE 14 DAY  SENSOR) MISC Use as directed to check blood sugars 3 times per day dx: e11.65  . Cyanocobalamin (VITAMIN B-12 PO) Take 500 mcg by mouth.   . Dulaglutide (TRULICITY) 1.5 VF/6.4PP SOPN Inject 1.5 mg into the skin once a week.  Marland Kitchen glucosamine-chondroitin 500-400 MG tablet Take 1 tablet by mouth daily. Reported on 12/17/2015  . glucose blood (ONETOUCH VERIO) test strip CHECK BLOOD SUGAR THREE TIMES DAILY dx: e11.65  . Insulin Degludec (TRESIBA FLEXTOUCH) 200 UNIT/ML SOPN Inject 26 Units into the skin daily. Max dose is 80 units  . Insulin Pen Needle (BD PEN NEEDLE NANO U/F) 32G X 4 MM MISC USE TO INJECT INSULIN DAILY  . Krill Oil 350 MG CAPS Take 350 mg by mouth daily.   . magnesium oxide (MAG-OX) 400 MG tablet Take 400 mg by mouth daily.  . metoprolol tartrate (LOPRESSOR) 50 MG tablet TAKE 1 TABLET BY MOUTH TWICE DAILY  . nystatin (NYSTATIN) powder Apply topically 3 (three) times daily. As needed  . omeprazole (PRILOSEC) 20 MG capsule TAKE 1 CAPSULE(20 MG) BY MOUTH DAILY  . ONETOUCH DELICA LANCETS 29J MISC USE AS DIRECTED TO CHECK BLOOD SUGAR THREE TIMES DAILY  . pregabalin (LYRICA) 50 MG capsule TAKE 1 CAPSULE(50 MG) BY MOUTH TWICE DAILY  . rosuvastatin (CRESTOR) 20 MG tablet Take 1 tablet (20 mg total) by mouth daily.  . traZODone (DESYREL) 50 MG tablet TAKE 1 TABLET BY MOUTH EVERY NIGHT AT BEDTIME  . vitamin C (ASCORBIC ACID) 500 MG tablet Take 1,000 mg  by mouth 2 (two) times daily.   . Vitamin D, Ergocalciferol, (DRISDOL) 1.25 MG (50000 UT) CAPS capsule Take 1 capsule (50,000 Units total) by mouth every 7 (seven) days.  . vitamin E 200 UNIT capsule Take 200 Units by mouth daily.   No facility-administered encounter medications on file as of 09/14/2019.     Objective:   Goals Addressed            This Visit's Progress     Patient Stated   . I would like to reduce my A1c  (pt-stated)       Pharmacist Clinical Goal(s):  Marland Kitchen Over the next 90 days, patient will demonstrate Improved  medication adherence as evidenced by daily medication adherence, FBG <130 and controlled BGs throughout the day: 06/27/19: Target Goal Date revised to 90 days due to treatment delays secondary to COVID-19  Current Barriers:  . Diabetes: T2DM; most recent A1c 8.1% on 08/10/19.  Patient states she is adhering to a healthier diet. She has also lost 4 lbs.  She is working to lose more. . Current antihyperglycemic regimen: Tresiba 34 units (this has increased based on recent BGs), Trulicity 1.5mg  weekly, Fiasp sample given--sliding scale reviewed per medication list o Multiclix lancets called into pharmacy--new insurance card updated, $0 copay o Libre CGM must be sent to [facility] medical and new patient profile must be set up by calling [phone]--cannot be filled at local pharmacy . denies hypoglycemic symptoms; denies hyperglycemic symptoms . Current meal patterns: patient is now mindful of carbohydrates; choosing meat and vegetables over carbs o Encouraged patient to avoid juices/sugary drinks . Current exercise: walking . Current blood glucose readings: FBG<130 . Cardiovascular risk reduction: o Current hypertensive regimen: amlodipine, metoprolol BID o Current hyperlipidemia regimen:  Crestor 20mg  daily, LDL 81 on 01/2019 o Discussed COVID 19 vaccine with patient   PharmD & CCM RN Interventions: . Comprehensive medication review performed, medication list updated in the EMR.  Reviewed medication fill history via insurance claims data confirming patient appears compliant with having her medications filled on time as prescribed by provider. . Reviewed & discussed the following diabetes-related information with patient: o Follow ADA recommended "diabetes-friendly" diet  (reviewed healthy snack/food options) o Discussed insulin/GLP-1 injection technique; Patient uses One touch Verio glucometer o Reviewed medication purpose/side effects-->patient denies adverse events  Patient Self Care  Activities:  . Patient will check blood glucose daily , document, and provide at future appointments . Patient will focus on medication adherence by continuing to take medications as prescribed . Patient will take medications as prescribed . Patient will contact provider with any episodes of hypoglycemia . Patient will report any questions or concerns to provider    Please see past updates related to this goal by clicking on the "Past Updates" button in the selected goal          Provider Signature  02/2019, PharmD, BCPS Clinical Pharmacist, [facility]  [facility]  Direct Dial: [phone]

## 2019-09-15 NOTE — Patient Instructions (Signed)
Visit Information  Goals Addressed            This Visit's Progress     Patient Stated   . I would like to reduce my A1c  (pt-stated)       Pharmacist Clinical Goal(s):  Marland Kitchen Over the next 90 days, patient will demonstrate Improved medication adherence as evidenced by daily medication adherence, FBG <130 and controlled BGs throughout the day: 06/27/19: Target Goal Date revised to 90 days due to treatment delays secondary to COVID-19  Current Barriers:  . Diabetes: T2DM; most recent A1c 8.1% on 08/10/19.  Patient states she is adhering to a healthier diet. She has also lost 4 lbs.  She is working to lose more. . Current antihyperglycemic regimen: Tresiba 34 units (this has increased based on recent BGs), Trulicity 1.5mg  weekly, Fiasp sample given--sliding scale reviewed per medication list o Multiclix lancets called into pharmacy--new insurance card updated, $0 copay o Libre CGM must be sent to [facility] and new patient profile must be set up by calling [phone]--cannot be filled at local pharmacy . denies hypoglycemic symptoms; denies hyperglycemic symptoms . Current meal patterns: patient is now mindful of carbohydrates; choosing meat and vegetables over carbs o Encouraged patient to avoid juices/sugary drinks . Current exercise: walking . Current blood glucose readings: FBG<130 . Cardiovascular risk reduction: o Current hypertensive regimen: amlodipine, metoprolol BID o Current hyperlipidemia regimen:  Crestor 20mg  daily, LDL 81 on 01/2019 o Discussed COVID 19 vaccine with patient   PharmD & CCM RN Interventions: . Comprehensive medication review performed, medication list updated in the EMR.  Reviewed medication fill history via insurance claims data confirming patient appears compliant with having her medications filled on time as prescribed by provider. . Reviewed & discussed the following diabetes-related information with patient: o Follow ADA recommended  "diabetes-friendly" diet  (reviewed healthy snack/food options) o Discussed insulin/GLP-1 injection technique; Patient uses One touch Verio glucometer o Reviewed medication purpose/side effects-->patient denies adverse events  Patient Self Care Activities:  . Patient will check blood glucose daily , document, and provide at future appointments . Patient will focus on medication adherence by continuing to take medications as prescribed . Patient will take medications as prescribed . Patient will contact provider with any episodes of hypoglycemia . Patient will report any questions or concerns to provider    Please see past updates related to this goal by clicking on the "Past Updates" button in the selected goal         The patient verbalized understanding of instructions provided today and declined a print copy of patient instruction materials.  SIGNATURE 02/2019, PharmD, BCPS Clinical Pharmacist, [facility]  [facility]  Direct Dial: [phone]

## 2019-09-15 NOTE — Patient Instructions (Signed)
Visit Information  Goals Addressed            This Visit's Progress     Patient Stated   . "I don't want to be Diabetic anymore" (pt-stated)       Current Barriers: CARE PLAN ENTRY (see longtitudinal plan of care for additional care plan information)  . Knowledge Deficits related to disease process and Self Health Management of Diabetes . Chronic Disease Management support and education needs related to DMII, HTN  Nurse Case Manager Clinical Goal(s):  Marland Kitchen Over the next 90 days, patient will work with RN CM to address needs related to weight loss plan and Diabetes 11/17/18: Target goal date revised to 90 days secondary to COVID-19 treatment delays -12/22/18 Goal Met . Over the next 30 days, patient will have increased understanding of what foods to eat per recommendations provided by the American Diabetic Association Goal Met  . 08/24/19 New Over the next 30 days, patient will verbalize improved adherence with following her diabetic diet by eating/drinking fewer carbohydrates and sugary foods   . 08/24/19 New Over the next 90 days, patient will achieve daily glycemic FBS within range of 80-130 and < 190 after meals      CCM RN CM Interventions: 09/14/19 inbound call completed with patient . Inbound call received from patient to advise she received notification from her pharmacy that the [facility] is ready but will cost $137 and her lancets will cost $37 . Determined [name] has dual coverage Medicare/Medicaid and should not be charged a copay for these items . Collaborated with embedded Pharm D Lottie Dawson to ask for assistance in verifying coverage for these items . Determined the patient has a $0 copay for her lancets after providing the pharmacy updated insurance information . Determined the Elenor Legato Rx will need to be resent to [facility] and should include the required clinical notes per PCP 09/15/19 placed outbound call to patient  . Provided patient with an update  concerning her lancets and Libre system, patient verbalizes understanding . Discussed patient's FBS today was 137; Advised patient, providing education and rationale, to check cbg 1-2 times daily and record, calling the CCM team and or PCP for findings outside established parameters; FBS 80-130; <190 after meals . Discussed plans with patient for ongoing care management follow up and provided patient with direct contact information for care management team  Patient Self Care Activities:  . Self administers medications as prescribed . Attends all scheduled provider appointments . Calls pharmacy for medication refills . Performs ADL's independently . Performs IADL's independently . Calls provider office for new concerns or questions  Please see past updates related to this goal by clicking on the "Past Updates" button in the selected goal       Patient verbalizes understanding of instructions provided today.   Telephone follow up appointment with care management team member scheduled for:09/30/19  Barb Merino, RN, BSN, CCM Care Management Coordinator [place] [facility]  Direct Phone: [phone]

## 2019-09-15 NOTE — Chronic Care Management (AMB) (Signed)
Chronic Care Management   Follow Up Note   09/15/2019 Name: [name] [name] MRN: [id number] DOB: 1951/03/14  Referred by: Glendale Chard, MD Reason for referral : Chronic Care Management (FU Inbound Call - DM)   [name] [name] is a 69 y.o. year old female who is a primary care patient of Glendale Chard, MD. The CCM team was consulted for assistance with chronic disease management and care coordination needs.    Review of patient status, including review of consultants reports, relevant laboratory and other test results, and collaboration with appropriate care team members and the patient's provider was performed as part of comprehensive patient evaluation and provision of chronic care management services.    SDOH (Social Determinants of Health) assessments performed: No See Care Plan activities for detailed interventions related to [name] [name])   Inbound call received from patient concerning cost for DM supplies.     Outpatient Encounter Medications as of 09/14/2019  Medication Sig  . Accu-Chek FastClix Lancets MISC CHECK BLOOD SUGAR THREE TIMES DAILY dx: e11.65  . amLODipine (NORVASC) 10 MG tablet TAKE 1 TABLET(10 MG) BY MOUTH DAILY  . aspirin 81 MG tablet Take 81 mg by mouth daily.  . beta carotene 25000 UNIT capsule Take 25,000 Units by mouth daily.  Marland Kitchen BIOTIN PO Take 500 mcg by mouth daily.   . Cholecalciferol ([employer] VIT D 5000 HIGH-POTENCY PO) Take 1 tablet by mouth daily.   . CHROMIUM PICOLINATE PO Take 200 mcg by mouth daily.   . Coenzyme Q10 (COQ-10) 100 MG CAPS Take 1 tablet by mouth daily.   . Continuous Blood Gluc Receiver (FREESTYLE LIBRE 14 DAY READER) DEVI Inject 1 each into the skin in the morning, at noon, and at bedtime.  . Continuous Blood Gluc Sensor (FREESTYLE LIBRE 14 DAY SENSOR) MISC Use as directed to check blood sugars 3 times per day dx: e11.65  . Cyanocobalamin (VITAMIN B-12 PO) Take 500 mcg by mouth.   . Dulaglutide (TRULICITY) 1.5 NI/6.2VO SOPN Inject 1.5 mg into the  skin once a week.  Marland Kitchen glucosamine-chondroitin 500-400 MG tablet Take 1 tablet by mouth daily. Reported on 12/17/2015  . glucose blood (ONETOUCH VERIO) test strip CHECK BLOOD SUGAR THREE TIMES DAILY dx: e11.65  . Insulin Degludec (TRESIBA FLEXTOUCH) 200 UNIT/ML SOPN Inject 26 Units into the skin daily. Max dose is 80 units  . Insulin Pen Needle (BD PEN NEEDLE NANO U/F) 32G X 4 MM MISC USE TO INJECT INSULIN DAILY  . Krill Oil 350 MG CAPS Take 350 mg by mouth daily.   . magnesium oxide (MAG-OX) 400 MG tablet Take 400 mg by mouth daily.  . metoprolol tartrate (LOPRESSOR) 50 MG tablet TAKE 1 TABLET BY MOUTH TWICE DAILY  . nystatin (NYSTATIN) powder Apply topically 3 (three) times daily. As needed  . omeprazole (PRILOSEC) 20 MG capsule TAKE 1 CAPSULE(20 MG) BY MOUTH DAILY  . ONETOUCH DELICA LANCETS 35K MISC USE AS DIRECTED TO CHECK BLOOD SUGAR THREE TIMES DAILY  . pregabalin (LYRICA) 50 MG capsule TAKE 1 CAPSULE(50 MG) BY MOUTH TWICE DAILY  . rosuvastatin (CRESTOR) 20 MG tablet Take 1 tablet (20 mg total) by mouth daily.  . traZODone (DESYREL) 50 MG tablet TAKE 1 TABLET BY MOUTH EVERY NIGHT AT BEDTIME  . vitamin C (ASCORBIC ACID) 500 MG tablet Take 1,000 mg by mouth 2 (two) times daily.   . vitamin E 200 UNIT capsule Take 200 Units by mouth daily.  . [DISCONTINUED] Vitamin D, Ergocalciferol, (DRISDOL) 1.25 MG (50000 UT) CAPS capsule  Take 1 capsule (50,000 Units total) by mouth every 7 (seven) days.   No facility-administered encounter medications on file as of 09/14/2019.     Objective:  Lab Results  Component Value Date   HGBA1C 8.1 (H) 08/10/2019   HGBA1C 7.9 (H) 05/05/2019   HGBA1C 8.0 (H) 01/26/2019   Lab Results  Component Value Date   MICROALBUR 30 03/23/2019   LDLCALC 62 08/10/2019   CREATININE 0.79 08/10/2019   BP Readings from Last 3 Encounters:  09/12/19 122/84  08/10/19 126/82  06/30/19 128/78    Goals Addressed      Patient Stated   . "I don't want to be Diabetic  anymore" (pt-stated)       Current Barriers: CARE PLAN ENTRY (see longtitudinal plan of care for additional care plan information)  . Knowledge Deficits related to disease process and Self Health Management of Diabetes . Chronic Disease Management support and education needs related to DMII, HTN  Nurse Case Manager Clinical Goal(s):  Marland Kitchen Over the next 90 days, patient will work with RN CM to address needs related to weight loss plan and Diabetes 11/17/18: Target goal date revised to 90 days secondary to COVID-19 treatment delays -12/22/18 Goal Met . Over the next 30 days, patient will have increased understanding of what foods to eat per recommendations provided by the American Diabetic Association Goal Met  . 08/24/19 New Over the next 30 days, patient will verbalize improved adherence with following her diabetic diet by eating/drinking fewer carbohydrates and sugary foods   . 08/24/19 New Over the next 90 days, patient will achieve daily glycemic FBS within range of 80-130 and < 190 after meals      CCM RN CM Interventions: 09/14/19 inbound call completed with patient . Inbound call received from patient to advise she received notification from her pharmacy that the [facility] is ready but will cost $137 and her lancets will cost $37 . Determined [name] [name] has dual coverage Medicare/Medicaid and should not be charged a copay for these items . Collaborated with embedded Pharm D Lottie Dawson to ask for assistance in verifying coverage for these items . Determined the patient has a $0 copay for her lancets after providing the pharmacy updated insurance information . Determined the Elenor Legato Rx will need to be resent to [facility] and should include the required clinical notes per PCP 09/15/19 placed outbound call to patient  . Provided patient with an update concerning her lancets and Libre system, patient verbalizes understanding . Discussed patient's FBS today was 137; Advised patient,  providing education and rationale, to check cbg 1-2 times daily and record, calling the CCM team and or PCP for findings outside established parameters; FBS 80-130; <190 after meals . Discussed plans with patient for ongoing care management follow up and provided patient with direct contact information for care management team  Patient Self Care Activities:  . Self administers medications as prescribed . Attends all scheduled provider appointments . Calls pharmacy for medication refills . Performs ADL's independently . Performs IADL's independently . Calls provider office for new concerns or questions  Please see past updates related to this goal by clicking on the "Past Updates" button in the selected goal        Plan:   Telephone follow up appointment with care management team member scheduled for: 09/30/19  Barb Merino, RN, BSN, CCM Care Management Coordinator [place] [facility]  Direct Phone: [phone]

## 2019-09-16 ENCOUNTER — Other Ambulatory Visit: Payer: Self-pay

## 2019-09-16 ENCOUNTER — Encounter: Payer: Self-pay | Admitting: Registered"

## 2019-09-16 ENCOUNTER — Encounter: Payer: Medicare Other | Attending: Internal Medicine | Admitting: Registered"

## 2019-09-16 DIAGNOSIS — Z794 Long term (current) use of insulin: Secondary | ICD-10-CM | POA: Diagnosis not present

## 2019-09-16 DIAGNOSIS — N182 Chronic kidney disease, stage 2 (mild): Secondary | ICD-10-CM | POA: Diagnosis not present

## 2019-09-16 DIAGNOSIS — E1122 Type 2 diabetes mellitus with diabetic chronic kidney disease: Secondary | ICD-10-CM | POA: Diagnosis not present

## 2019-09-16 NOTE — Progress Notes (Signed)
Diabetes Self-Management Education  Visit Type: First/Initial  Appt. Start Time: 0950 Appt. End Time: 1100  09/16/2019  [name], identified by name and date of birth, is a 69 y.o. female with a diagnosis of Diabetes: Type 2.   ASSESSMENT  There were no vitals taken for this visit. There is no height or weight on file to calculate BMI.   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  Pt states she likes to be around people and stay involved in community. Pt states she participated in a [employer] for children in 2nd grade had 4 students. Although they cannot work with the kids, the team is still having [employer]. Patient reports she is interested in participating in the T2DM support group.  Patient states she lives in a retirement community and tried to get the other residents to participate in activities, including outings, birthday celebrations, and community garden, but their attitude wore her out and not interested in doing it anymore.  Pt states she thinks sitting around looking at the walls as well as a recent potential health concern increased her stress and states she can see it in her blood sugar readings. Pt states when it was getting in the 300s her MD kept adding medications to bring it down. Pt states she is tired of sticking herself to check blood sugar as well as all the insulin and injectable medications.   Pt reports dosing Fiasp Felxtouch sliding scale as prescribed, however she had been waiting maybe an hour to eat.  Diabetes Self-Management Education - 09/16/19 1015      Visit Information   Visit Type  First/Initial      Initial Visit   Diabetes Type  Type 2    Are you currently following a meal plan?  No    Are you taking your medications as prescribed?  Yes    Date Diagnosed  2006      Health Coping   How  would you rate your overall health?  Good      Psychosocial Assessment   Patient Belief/Attitude about Diabetes  Defeat/Burnout    How often do you need to have someone help you when you read instructions, pamphlets, or other written materials from your doctor or pharmacy?  1 - Never    What is the last grade level you completed in school?  GED and some college      Complications   Last HgB A1C per patient/outside source  8.1 %   2//17/21   How often do you check your blood sugar?  3-4 times/day    Fasting Blood glucose range (mg/dL)  [phone]   [phone]   Have you had a dilated eye exam in the past 12 months?  No    Have you had a dental exam in the past 12 months?  Yes    Are you checking your feet?  Yes    How many days per week are you checking your feet?  2      Dietary Intake   Breakfast  Belvita OR salmon, eggs, grits OR samlon patties OR [place] sausage & eggs OR honey nut cheerios or special K    Snack (morning)  none    Lunch  salad, meat OR none    Snack (afternoon)  PB crackers    Dinner  calf  liver, gravy, brocolli cheese, green beans    Snack (evening)  fruit    Beverage(s)  water, a little juice, coffee      Exercise   Exercise Type  ADL's      Patient Education   Previous Diabetes Education  Yes (please comment)   at diagnosis   Nutrition management   Role of diet in the treatment of diabetes and the relationship between the three main macronutrients and blood glucose level    Medications  Other (comment)   timing of mealtime insulin   Psychosocial adjustment  Role of stress on diabetes      Individualized Goals (developed by patient)   Nutrition  General guidelines for healthy choices and portions discussed    Health Coping  Other (comment)   stress management     Outcomes   Expected Outcomes  Demonstrated interest in learning. Expect positive outcomes    Future DMSE  4-6 wks    Program Status  Not Completed       Individualized Plan for  Diabetes Self-Management Training:   Learning Objective:  Patient will have a greater understanding of diabetes self-management. Patient education plan is to attend individual and/or group sessions per assessed needs and concerns.  Patient Instructions  Remember to take Claiborne Billings when you start eating or within 20 minutes for it to work best.  When you wake up during the night, ask Alexa to play something that will help you go back to sleep.  When getting a craving for ice cream thing about other things that can help relieve stress. Continue to work on Child psychotherapist. Continue getting outside.  Continue to eat balanced meals and consider having protein every morning.  Exercise can really help and go back to [employer] when you can.  Watch your email for the support group link, next one on April 12    Expected Outcomes:  Demonstrated interest in learning. Expect positive outcomes  Education material provided: ADA - How to Thrive: A Guide for Your Journey with Diabetes  If problems or questions, patient to contact team via:  Phone and MyChart  Future DSME appointment: 4-6 wks

## 2019-09-16 NOTE — Patient Instructions (Addendum)
Remember to take [facility] when you start eating or within 20 minutes for it to work best.  When you wake up during the night, ask Alexa to play something that will help you go back to sleep.  When getting a craving for ice cream thing about other things that can help relieve stress. Continue to work on Optician, dispensing. Continue getting outside.  Continue to eat balanced meals and consider having protein every morning.  Exercise can really help and go back to [facility] when you can.  Watch your email for the support group link, next one on April 12

## 2019-09-21 ENCOUNTER — Telehealth: Payer: Self-pay

## 2019-09-21 NOTE — Progress Notes (Signed)
This visit occurred during the SARS-CoV-2 public health emergency.  Safety protocols were in place, including screening questions prior to the visit, additional usage of staff PPE, and extensive cleaning of exam room while observing appropriate contact time as indicated for disinfecting solutions.  Subjective:     Patient ID: [name] [name] , female    DOB: [birth year] , 69 y.o.   MRN: [id number]   Chief Complaint  Patient presents with  . Fatigue  . Generalized Body Aches    HPI  She is here today for further evaluation of fatigue and body aches.  She recently spoke with CCM Nurse and discussed her complaints and the need for an appointment. However, she reports feeling much better today. She changed the filter in her CPAP machine. She reports no one had ever told her to do this. She admits that it was quite filthy.  She reports having a better night's sleep and feeling more refreshed upon awakening.     Past Medical History:  Diagnosis Date  . Anemia   . Carpal tunnel syndrome   . Coronary artery disease    DES to ostial RCA 2005  . Diabetes mellitus without complication ([place])    No longer taking meds  . Dyslipidemia   . Hypertension   . Osteoarthritis   . PAD (peripheral artery disease) ([place])   . Sleep apnea      Family History  Problem Relation Age of Onset  . CAD Father 23  . Heart attack Father   . CAD Mother 17  . Heart attack Mother   . CAD Brother   . Cancer Brother        prostate  . CAD Sister   . Cancer Sister        breast  . CAD Brother      Current Outpatient Medications:  .  amLODipine (NORVASC) 10 MG tablet, TAKE 1 TABLET(10 MG) BY MOUTH DAILY, Disp: 90 tablet, Rfl: 1 .  aspirin 81 MG tablet, Take 81 mg by mouth daily., Disp: , Rfl:  .  beta carotene 25000 UNIT capsule, Take 25,000 Units by mouth daily., Disp: , Rfl:  .  BIOTIN PO, Take 500 mcg by mouth daily. , Disp: , Rfl:  .  CHROMIUM PICOLINATE PO, Take 200 mcg by mouth daily. , Disp: , Rfl:   .  Coenzyme Q10 (COQ-10) 100 MG CAPS, Take 1 tablet by mouth daily. , Disp: , Rfl:  .  Cyanocobalamin (VITAMIN B-12 PO), Take 500 mcg by mouth. , Disp: , Rfl:  .  Dulaglutide (TRULICITY) 1.5 NO/7.0JG SOPN, Inject 1.5 mg into the skin once a week., Disp: 12 pen, Rfl: 1 .  glucosamine-chondroitin 500-400 MG tablet, Take 1 tablet by mouth daily. Reported on 12/17/2015, Disp: , Rfl:  .  glucose blood (ONETOUCH VERIO) test strip, CHECK BLOOD SUGAR THREE TIMES DAILY dx: e11.65, Disp: 150 each, Rfl: 11 .  Insulin Degludec (TRESIBA FLEXTOUCH) 200 UNIT/ML SOPN, Inject 26 Units into the skin daily. Max dose is 80 units, Disp: 15 pen, Rfl: 1 .  Insulin Pen Needle (BD PEN NEEDLE NANO U/F) 32G X 4 MM MISC, USE TO INJECT INSULIN DAILY, Disp: 100 each, Rfl: 3 .  Krill Oil 350 MG CAPS, Take 350 mg by mouth daily. , Disp: , Rfl:  .  magnesium oxide (MAG-OX) 400 MG tablet, Take 400 mg by mouth daily., Disp: , Rfl:  .  metoprolol tartrate (LOPRESSOR) 50 MG tablet, TAKE 1 TABLET BY MOUTH TWICE DAILY, Disp:  180 tablet, Rfl: 4 .  nystatin (NYSTATIN) powder, Apply topically 3 (three) times daily. As needed, Disp: 60 g, Rfl: 0 .  omeprazole (PRILOSEC) 20 MG capsule, TAKE 1 CAPSULE(20 MG) BY MOUTH DAILY, Disp: 90 capsule, Rfl: 1 .  ONETOUCH DELICA LANCETS 33G MISC, USE AS DIRECTED TO CHECK BLOOD SUGAR THREE TIMES DAILY, Disp: 100 each, Rfl: 0 .  pregabalin (LYRICA) 50 MG capsule, TAKE 1 CAPSULE(50 MG) BY MOUTH TWICE DAILY, Disp: 180 capsule, Rfl: 1 .  rosuvastatin (CRESTOR) 20 MG tablet, Take 1 tablet (20 mg total) by mouth daily., Disp: 90 tablet, Rfl: 3 .  vitamin C (ASCORBIC ACID) 500 MG tablet, Take 1,000 mg by mouth 2 (two) times daily. , Disp: , Rfl:  .  vitamin E 200 UNIT capsule, Take 200 Units by mouth daily., Disp: , Rfl:  .  Accu-Chek FastClix Lancets MISC, CHECK BLOOD SUGAR THREE TIMES DAILY dx: e11.65, Disp: 100 each, Rfl: 2 .  Cholecalciferol (CVS VIT D 5000 HIGH-POTENCY PO), Take 1 tablet by mouth daily. ,  Disp: , Rfl:  .  Continuous Blood Gluc Receiver (FREESTYLE LIBRE 14 DAY READER) DEVI, Inject 1 each into the skin in the morning, at noon, and at bedtime., Disp: 1 each, Rfl: 2 .  Continuous Blood Gluc Sensor (FREESTYLE LIBRE 14 DAY SENSOR) MISC, Use as directed to check blood sugars 3 times per day dx: e11.65, Disp: 2 each, Rfl: 2 .  Insulin Aspart, w/Niacinamide, (FIASP FLEXTOUCH [place]), Inject into the skin. Use sliding scale as directed three times daily. Inject 2 units for BG 150-199, 4 units for BG 200-249, 6 units for BG 250-299, 8 units for BG 300-340, 10 units for BG>350, BG>400 call MD, Disp: , Rfl:  .  traZODone (DESYREL) 50 MG tablet, TAKE 1 TABLET BY MOUTH EVERY NIGHT AT BEDTIME, Disp: 90 tablet, Rfl: 1 .  Vitamin D, Ergocalciferol, (DRISDOL) 1.25 MG (50000 UNIT) CAPS capsule, TAKE 1 CAPSULE BY MOUTH EVERY 7 DAYS, Disp: 12 capsule, Rfl: 1   Allergies  Allergen Reactions  . Fish Allergy     Cod  . Latex Rash     Review of Systems  Constitutional: Positive for fatigue.  Respiratory: Negative.   Cardiovascular: Negative.   Gastrointestinal: Negative.   Musculoskeletal: Positive for arthralgias.  Neurological: Negative.   Psychiatric/Behavioral: Negative.      Today's Vitals   09/12/19 0930  BP: 122/84  Pulse: 75  Temp: 98.5 F (36.9 C)  TempSrc: Oral  Weight: 191 lb 3.2 oz (86.7 kg)  Height: 5' 3.5" (1.613 m)  PainSc: 4   PainLoc: Generalized   Body mass index is 33.34 kg/m.   Objective:  Physical Exam Vitals and nursing note reviewed.  Constitutional:      Appearance: Normal appearance. She is obese.  HENT:     Head: Normocephalic and atraumatic.  Cardiovascular:     Rate and Rhythm: Normal rate and regular rhythm.     Heart sounds: Normal heart sounds.  Pulmonary:     Effort: Pulmonary effort is normal.     Breath sounds: Normal breath sounds.  Skin:    General: Skin is warm.  Neurological:     General: No focal deficit present.     Mental Status: She  is alert.  Psychiatric:        Mood and Affect: Mood normal.        Behavior: Behavior normal.         Assessment And Plan:     1.  Fatigue, unspecified type  Improved with change of filter on CPAP. Pt advised to contact DME company for care instructions for her CPAP machine. She is also encouraged to stay well hydrated. I also advised her to increase her daily activity since she is feeling better.   2. Arthralgia, unspecified joint  Chronic. Her sx are improving. I was planning to discuss initiating duloxetine; however, she does not think this is necessary at this time. Advised to follow an anti-inflammatory diet.   3. OSA (obstructive sleep apnea)  Chronic. Importance of CPAP compliance was discussed with the patient.   4. Type 2 diabetes mellitus with stage 2 chronic kidney disease, with long-term current use of insulin (HCC)  She reports having elevated blood sugars. She agrees to Nutrition referral. I will also start her on sliding scale. She was instructed to check sugars before every meal. Right now, I want to focus on fasting blood sugars. She is encouraged to email her sugars every Monday so her insulin dose can be adjusted.   - Referral to Nutrition and Diabetes Services  5. Class 1 obesity due to excess calories with serious comorbidity and body mass index (BMI) of 33.0 to 33.9 in adult  Chronic. She is encouraged to strive for BMI less than 30 to decrease cardiac risk.   [name], MD    THE PATIENT IS ENCOURAGED TO PRACTICE SOCIAL DISTANCING DUE TO THE COVID-19 PANDEMIC.

## 2019-09-23 ENCOUNTER — Other Ambulatory Visit: Payer: Self-pay | Admitting: Internal Medicine

## 2019-09-25 ENCOUNTER — Other Ambulatory Visit: Payer: Self-pay | Admitting: Internal Medicine

## 2019-09-26 NOTE — Telephone Encounter (Signed)
Pregabalin refill

## 2019-09-30 ENCOUNTER — Telehealth: Payer: Self-pay

## 2019-10-02 DIAGNOSIS — G4733 Obstructive sleep apnea (adult) (pediatric): Secondary | ICD-10-CM | POA: Diagnosis not present

## 2019-10-04 DIAGNOSIS — Z800 Family history of malignant neoplasm of digestive organs: Secondary | ICD-10-CM | POA: Diagnosis not present

## 2019-10-04 DIAGNOSIS — R195 Other fecal abnormalities: Secondary | ICD-10-CM | POA: Diagnosis not present

## 2019-10-07 DIAGNOSIS — Z1159 Encounter for screening for other viral diseases: Secondary | ICD-10-CM | POA: Diagnosis not present

## 2019-10-12 ENCOUNTER — Other Ambulatory Visit: Payer: Self-pay | Admitting: Internal Medicine

## 2019-10-12 DIAGNOSIS — R195 Other fecal abnormalities: Secondary | ICD-10-CM | POA: Diagnosis not present

## 2019-10-12 DIAGNOSIS — K5730 Diverticulosis of large intestine without perforation or abscess without bleeding: Secondary | ICD-10-CM | POA: Diagnosis not present

## 2019-10-12 DIAGNOSIS — Z800 Family history of malignant neoplasm of digestive organs: Secondary | ICD-10-CM | POA: Diagnosis not present

## 2019-10-12 DIAGNOSIS — K648 Other hemorrhoids: Secondary | ICD-10-CM | POA: Diagnosis not present

## 2019-10-12 HISTORY — PX: COLONOSCOPY WITH PROPOFOL: SHX5780

## 2019-10-12 LAB — HM COLONOSCOPY

## 2019-10-16 ENCOUNTER — Other Ambulatory Visit: Payer: Self-pay | Admitting: Internal Medicine

## 2019-10-16 DIAGNOSIS — N182 Chronic kidney disease, stage 2 (mild): Secondary | ICD-10-CM

## 2019-10-16 DIAGNOSIS — E1122 Type 2 diabetes mellitus with diabetic chronic kidney disease: Secondary | ICD-10-CM

## 2019-10-16 NOTE — Progress Notes (Signed)
re

## 2019-10-17 ENCOUNTER — Other Ambulatory Visit: Payer: Self-pay

## 2019-10-17 ENCOUNTER — Encounter: Payer: Medicare Other | Attending: Internal Medicine | Admitting: Registered"

## 2019-10-17 ENCOUNTER — Encounter: Payer: Self-pay | Admitting: Registered"

## 2019-10-17 DIAGNOSIS — E1122 Type 2 diabetes mellitus with diabetic chronic kidney disease: Secondary | ICD-10-CM | POA: Diagnosis not present

## 2019-10-17 DIAGNOSIS — N182 Chronic kidney disease, stage 2 (mild): Secondary | ICD-10-CM | POA: Diagnosis not present

## 2019-10-17 DIAGNOSIS — Z794 Long term (current) use of insulin: Secondary | ICD-10-CM | POA: Diagnosis not present

## 2019-10-17 NOTE — Progress Notes (Signed)
Diabetes Self-Management Education  Visit Type: Follow-up  Appt. Start Time: 1105 Appt. End Time: 1130  10/19/2019  [name] [name], identified by name and date of birth, is a 69 y.o. female with a diagnosis of Diabetes: Type 2.   ASSESSMENT  There were no vitals taken for this visit. There is no height or weight on file to calculate BMI.   Pt states her source of stress has been resolved when getting good report after colonoscopy. Pt states the blood in her stool was from hemorrhoids. Pt states until the results came back she was very concerned because her brother died from colon cancer.   Pt states she was told the hemorrhoids bled due to straining with bowel movements. Pt states she has increased fiber in diet and started taking metamucil again.  Pt states she has taken steps to start doing water exercises 2x week  starting in June. Pt reports the reason she is not starting n May is because she is waiting for partials that will be made, doesn't want to go out without teeth.  Pt states since last visit she is taking Fiasp close to meal times now. Pt states she has had reduced appetite but feels good with her intake.  Self Monitor Blood Sugar:  FBS: 116-153, mostly lower end 1-2 hours post meals: not checking  Pt states she feels she is on the right track and does not need additional follow-up visits.  Diabetes Self-Management Education - 10/17/19 1118      Visit Information   Visit Type  Follow-up      Initial Visit   Diabetes Type  Type 2      Complications   How often do you check your blood sugar?  1-2 times/day    Fasting Blood glucose range (mg/dL)  24-401;027-253   pt reported 116-153, mostly lower end   Postprandial Blood glucose range (mg/dL)  --   not checking   Number of hypoglycemic episodes per month  0      Exercise   Exercise Type  ADL's      Patient Education   Physical activity and exercise   Role of exercise on diabetes management, blood pressure  control and cardiac health.    Monitoring  Identified appropriate SMBG and/or A1C goals.    Psychosocial adjustment  Role of stress on diabetes      Individualized Goals (developed by patient)   Nutrition  Other (comment)   increase fiber, adequate hydration   Physical Activity  Exercise 3-5 times per week      Outcomes   Expected Outcomes  Demonstrated interest in learning. Expect positive outcomes    Future DMSE  PRN    Program Status  Completed       Individualized Plan for Diabetes Self-Management Training:   Learning Objective:  Patient will have a greater understanding of diabetes self-management. Patient education plan is to attend individual and/or group sessions per assessed needs and concerns.   Patient Instructions  Getting more fiber is a great idea. Dried beans, any vegetables, nuts Drink plenty of water Staying physically active - continue with plans to start doing water exercise at the [facility] in June  Continue checking your blood sugar:  Targets: Fasting 80-130; 2 hours after a meal 180 or less   Expected Outcomes:  Demonstrated interest in learning. Expect positive outcomes  Education material provided: none  If problems or questions, patient to contact team via:  Phone and MyChart  Future DSME appointment: PRN

## 2019-10-17 NOTE — Patient Instructions (Addendum)
Getting more fiber is a great idea. Dried beans, any vegetables, nuts Drink plenty of water Staying physically active - continue with plans to start doing water exercise at the [facility] in June  Continue checking your blood sugar:  Targets: Fasting 80-130; 2 hours after a meal 180 or less

## 2019-10-25 ENCOUNTER — Other Ambulatory Visit: Payer: Self-pay

## 2019-10-25 ENCOUNTER — Encounter: Payer: Self-pay | Admitting: Internal Medicine

## 2019-10-25 ENCOUNTER — Ambulatory Visit (INDEPENDENT_AMBULATORY_CARE_PROVIDER_SITE_OTHER): Payer: Medicare Other | Admitting: Internal Medicine

## 2019-10-25 VITALS — BP 128/76 | HR 73 | Temp 98.1°F | Ht 63.5 in | Wt 191.0 lb

## 2019-10-25 DIAGNOSIS — J449 Chronic obstructive pulmonary disease, unspecified: Secondary | ICD-10-CM

## 2019-10-25 DIAGNOSIS — N182 Chronic kidney disease, stage 2 (mild): Secondary | ICD-10-CM

## 2019-10-25 DIAGNOSIS — E6609 Other obesity due to excess calories: Secondary | ICD-10-CM

## 2019-10-25 DIAGNOSIS — I10 Essential (primary) hypertension: Secondary | ICD-10-CM

## 2019-10-25 DIAGNOSIS — E1122 Type 2 diabetes mellitus with diabetic chronic kidney disease: Secondary | ICD-10-CM

## 2019-10-25 DIAGNOSIS — I129 Hypertensive chronic kidney disease with stage 1 through stage 4 chronic kidney disease, or unspecified chronic kidney disease: Secondary | ICD-10-CM | POA: Diagnosis not present

## 2019-10-25 DIAGNOSIS — G4733 Obstructive sleep apnea (adult) (pediatric): Secondary | ICD-10-CM | POA: Diagnosis not present

## 2019-10-25 DIAGNOSIS — Z6833 Body mass index (BMI) 33.0-33.9, adult: Secondary | ICD-10-CM

## 2019-10-25 DIAGNOSIS — B182 Chronic viral hepatitis C: Secondary | ICD-10-CM

## 2019-10-25 DIAGNOSIS — Z794 Long term (current) use of insulin: Secondary | ICD-10-CM | POA: Diagnosis not present

## 2019-10-25 MED ORDER — TRESIBA FLEXTOUCH 200 UNIT/ML ~~LOC~~ SOPN: 25.0000 [IU] | PEN_INJECTOR | Freq: Every day | SUBCUTANEOUS | 1 refills | Status: DC

## 2019-10-25 NOTE — Progress Notes (Signed)
This visit occurred during the SARS-CoV-2 public health emergency.  Safety protocols were in place, including screening questions prior to the visit, additional usage of staff PPE, and extensive cleaning of exam room while observing appropriate contact time as indicated for disinfecting solutions.  Subjective:     Patient ID: [name] [name] , female    DOB: 27-Mar-1951 , 69 y.o.   MRN: [id number]   Chief Complaint  Patient presents with  . Diabetes    HPI  She is here today for diabetes check. She reports compliance with meds. She admits that she has a liberal diet. She is not exercising.   Diabetes She presents for her follow-up diabetic visit. She has type 2 diabetes mellitus. Her disease course has been improving. Pertinent negatives for diabetes include no blurred vision and no chest pain. There are no hypoglycemic complications. Risk factors for coronary artery disease include diabetes mellitus, dyslipidemia, hypertension and post-menopausal. Current diabetic treatment includes insulin injections. She is compliant with treatment most of the time. She is following a diabetic diet. She participates in exercise intermittently. Eye exam is current.  Hypertension This is a chronic problem. The current episode started more than 1 year ago. The problem has been gradually improving since onset. The problem is controlled. Pertinent negatives include no blurred vision, chest pain or palpitations. Past treatments include angiotensin blockers. The current treatment provides moderate improvement. Compliance problems include exercise and diet.  Hypertensive end-organ damage includes kidney disease. Identifiable causes of hypertension include chronic renal disease.     Past Medical History:  Diagnosis Date  . Anemia   . Carpal tunnel syndrome   . Coronary artery disease    DES to ostial RCA 2005  . Diabetes mellitus without complication ([place])    No longer taking meds  . Dyslipidemia   .  Hypertension   . Osteoarthritis   . PAD (peripheral artery disease) ([place])   . Sleep apnea      Family History  Problem Relation Age of Onset  . CAD Father 70  . Heart attack Father   . CAD Mother 48  . Heart attack Mother   . CAD Brother   . Cancer Brother        prostate  . CAD Sister   . Cancer Sister        breast  . CAD Brother      Current Outpatient Medications:  .  Accu-Chek FastClix Lancets MISC, CHECK BLOOD SUGAR THREE TIMES DAILY dx: e11.65, Disp: 100 each, Rfl: 2 .  amLODipine (NORVASC) 10 MG tablet, TAKE 1 TABLET(10 MG) BY MOUTH DAILY, Disp: 90 tablet, Rfl: 1 .  aspirin 81 MG tablet, Take 81 mg by mouth daily., Disp: , Rfl:  .  beta carotene 25000 UNIT capsule, Take 25,000 Units by mouth daily., Disp: , Rfl:  .  Cholecalciferol (CVS VIT D 5000 HIGH-POTENCY PO), Take 1 tablet by mouth daily. , Disp: , Rfl:  .  CHROMIUM PICOLINATE PO, Take 200 mcg by mouth daily. , Disp: , Rfl:  .  Coenzyme Q10 (COQ-10) 100 MG CAPS, Take 1 tablet by mouth daily. , Disp: , Rfl:  .  Continuous Blood Gluc Receiver (FREESTYLE LIBRE 14 DAY READER) DEVI, Inject 1 each into the skin in the morning, at noon, and at bedtime., Disp: 1 each, Rfl: 2 .  Continuous Blood Gluc Sensor (FREESTYLE LIBRE 14 DAY SENSOR) MISC, Use as directed to check blood sugars 3 times per day dx: e11.65, Disp: 2 each, Rfl: 2 .  Cyanocobalamin (VITAMIN B-12 PO), Take 500 mcg by mouth. , Disp: , Rfl:  .  Dulaglutide (TRULICITY) 1.5 UK/0.2RK SOPN, Inject 1.5 mg into the skin once a week., Disp: 12 pen, Rfl: 1 .  glucosamine-chondroitin 500-400 MG tablet, Take 1 tablet by mouth daily. Reported on 12/17/2015, Disp: , Rfl:  .  glucose blood (ONETOUCH VERIO) test strip, CHECK BLOOD SUGAR THREE TIMES DAILY dx: e11.65, Disp: 150 each, Rfl: 11 .  Insulin Aspart, w/Niacinamide, (FIASP FLEXTOUCH [place]), Inject into the skin. Use sliding scale as directed three times daily. Inject 2 units for BG 150-199, 4 units for BG 200-249, 6 units  for BG 250-299, 8 units for BG 300-340, 10 units for BG>350, BG>400 call MD, Disp: , Rfl:  .  insulin degludec (TRESIBA FLEXTOUCH) 200 UNIT/ML FlexTouch Pen, Inject 26 Units into the skin daily. Max dose is 80 units, Disp: 15 pen, Rfl: 1 .  Insulin Pen Needle (BD PEN NEEDLE NANO U/F) 32G X 4 MM MISC, USE TO INJECT INSULIN DAILY, Disp: 100 each, Rfl: 3 .  Krill Oil 350 MG CAPS, Take 350 mg by mouth daily. , Disp: , Rfl:  .  magnesium oxide (MAG-OX) 400 MG tablet, Take 400 mg by mouth daily., Disp: , Rfl:  .  metoprolol tartrate (LOPRESSOR) 50 MG tablet, TAKE 1 TABLET BY MOUTH TWICE DAILY, Disp: 180 tablet, Rfl: 4 .  nystatin (NYSTATIN) powder, Apply topically 3 (three) times daily. As needed, Disp: 60 g, Rfl: 0 .  omeprazole (PRILOSEC) 20 MG capsule, TAKE 1 CAPSULE(20 MG) BY MOUTH DAILY, Disp: 90 capsule, Rfl: 1 .  ONETOUCH DELICA LANCETS 27C MISC, USE AS DIRECTED TO CHECK BLOOD SUGAR THREE TIMES DAILY, Disp: 100 each, Rfl: 0 .  pregabalin (LYRICA) 50 MG capsule, TAKE 1 CAPSULE(50 MG) BY MOUTH TWICE DAILY, Disp: 180 capsule, Rfl: 1 .  Psyllium (METAMUCIL FIBER PO), Take by mouth., Disp: , Rfl:  .  rosuvastatin (CRESTOR) 20 MG tablet, Take 1 tablet (20 mg total) by mouth daily., Disp: 90 tablet, Rfl: 3 .  traZODone (DESYREL) 50 MG tablet, TAKE 1 TABLET BY MOUTH EVERY NIGHT AT BEDTIME, Disp: 90 tablet, Rfl: 1 .  vitamin C (ASCORBIC ACID) 500 MG tablet, Take 1,000 mg by mouth 2 (two) times daily. , Disp: , Rfl:  .  Vitamin D, Ergocalciferol, (DRISDOL) 1.25 MG (50000 UNIT) CAPS capsule, TAKE 1 CAPSULE BY MOUTH EVERY 7 DAYS, Disp: 12 capsule, Rfl: 1 .  vitamin E 200 UNIT capsule, Take 200 Units by mouth daily., Disp: , Rfl:    Allergies  Allergen Reactions  . [place]  . Latex Rash     Review of Systems  Constitutional: Negative.   Eyes: Negative for blurred vision.  Respiratory: Negative.   Cardiovascular: Negative.  Negative for chest pain and palpitations.  Gastrointestinal:  Negative.   Neurological: Negative.   Psychiatric/Behavioral: Negative.      Today's Vitals   10/25/19 0938  BP: 128/76  Pulse: 73  Temp: 98.1 F (36.7 C)  TempSrc: Oral  Weight: 191 lb (86.6 kg)  Height: 5' 3.5" (1.613 m)  PainSc: 0-No pain   Body mass index is 33.3 kg/m.   Wt Readings from Last 3 Encounters:  10/25/19 191 lb (86.6 kg)  09/12/19 191 lb 3.2 oz (86.7 kg)  08/10/19 193 lb 9.6 oz (87.8 kg)     Objective:  Physical Exam Vitals and nursing note reviewed.  Constitutional:      Appearance: Normal appearance.  HENT:     Head: Normocephalic and atraumatic.  Cardiovascular:     Rate and Rhythm: Normal rate and regular rhythm.     Heart sounds: Normal heart sounds.  Pulmonary:     Effort: Pulmonary effort is normal.     Breath sounds: Normal breath sounds.  Skin:    General: Skin is warm.  Neurological:     General: No focal deficit present.     Mental Status: She is alert.  Psychiatric:        Mood and Affect: Mood normal.        Behavior: Behavior normal.         Assessment And Plan:     1. Type 2 diabetes mellitus with stage 2 chronic kidney disease, with long-term current use of insulin (HCC)  She is currently checking her sugars at least three times daily; along with four injections of insulin daily - basal insulin and Fiasp three times daily with meals. She is currently awaiting rx for [employer]. I will check labs as listed below.   - Hepatitis C antibody - CMP14+EGFR - Hemoglobin A1c  2. Essential hypertension, benign  Chronic, well controlled. She will continue with current meds. She is encouraged to avoid adding salt to her foods.   3. OSA and COPD overlap syndrome (HCC)  Again, importance of CPAP compliance was discussed with the patient. She is advised to wear at least 4 hours nightly and to change filters when needed.   4. Hepatitis C carrier [facility])  She reports past treatment with "medications from [place]". I will also  request her records from the health department.   - HCV RNA quant  5. Class 1 obesity due to excess calories with serious comorbidity and body mass index (BMI) of 33.0 to 33.9 in adult  She is planning to start water aerobics in June 2021.     [name], MD    THE PATIENT IS ENCOURAGED TO PRACTICE SOCIAL DISTANCING DUE TO THE COVID-19 PANDEMIC.

## 2019-10-26 ENCOUNTER — Other Ambulatory Visit: Payer: Self-pay | Admitting: Internal Medicine

## 2019-10-26 DIAGNOSIS — Z1231 Encounter for screening mammogram for malignant neoplasm of breast: Secondary | ICD-10-CM

## 2019-10-26 LAB — CMP14+EGFR
ALT: 14 IU/L (ref 0–32)
AST: 21 IU/L (ref 0–40)
Albumin/Globulin Ratio: 1.7 (ref 1.2–2.2)
Albumin: 4.5 g/dL (ref 3.8–4.8)
Alkaline Phosphatase: 179 IU/L — ABNORMAL HIGH (ref 39–117)
BUN/Creatinine Ratio: 8 — ABNORMAL LOW (ref 12–28)
BUN: 7 mg/dL — ABNORMAL LOW (ref 8–27)
Bilirubin Total: 0.3 mg/dL (ref 0.0–1.2)
CO2: 25 mmol/L (ref 20–29)
Calcium: 9.4 mg/dL (ref 8.7–10.3)
Chloride: 101 mmol/L (ref 96–106)
Creatinine, Ser: 0.84 mg/dL (ref 0.57–1.00)
GFR calc Af Amer: 83 mL/min/{1.73_m2} (ref >59)
GFR calc non Af Amer: 72 mL/min/{1.73_m2} (ref >59)
Globulin, Total: 2.7 g/dL (ref 1.5–4.5)
Glucose: 189 mg/dL — ABNORMAL HIGH (ref 65–99)
Potassium: 4.4 mmol/L (ref 3.5–5.2)
Sodium: 142 mmol/L (ref 134–144)
Total Protein: 7.2 g/dL (ref 6.0–8.5)

## 2019-10-26 LAB — HEMOGLOBIN A1C
Est. average glucose Bld gHb Est-mCnc: 180 mg/dL
Hgb A1c MFr Bld: 7.9 % — ABNORMAL HIGH (ref 4.8–5.6)

## 2019-10-26 LAB — HCV RNA QUANT: Hepatitis C Quantitation: NOT DETECTED IU/mL

## 2019-10-26 LAB — HEPATITIS C ANTIBODY: Hep C Virus Ab: >11 s/co ratio — ABNORMAL HIGH (ref 0.0–0.9)

## 2019-10-28 ENCOUNTER — Telehealth: Payer: Self-pay

## 2019-10-28 NOTE — Telephone Encounter (Signed)
-----   Message from [name], MD sent at 10/27/2019 10:58 PM EDT ----- Here are your lab results:  Your hepatitis C virus antibody is elevated.  Your viral load is low - there is no active infection.  Your liver and kidney function are stable. Your hba1c is 7.9, this has improved. Our goal is less than 7.5, you are almost there!   Walk more - move more - do not wait until June! You can do this!  Please let me know if you have any questions or concerns. Stay safe!   Sincerely,    Robyn N. Allyne Gee, MD

## 2019-10-31 DIAGNOSIS — Z794 Long term (current) use of insulin: Secondary | ICD-10-CM | POA: Diagnosis not present

## 2019-10-31 DIAGNOSIS — E1165 Type 2 diabetes mellitus with hyperglycemia: Secondary | ICD-10-CM | POA: Diagnosis not present

## 2019-11-01 DIAGNOSIS — G4733 Obstructive sleep apnea (adult) (pediatric): Secondary | ICD-10-CM | POA: Diagnosis not present

## 2019-11-03 ENCOUNTER — Telehealth: Payer: Self-pay

## 2019-11-03 ENCOUNTER — Other Ambulatory Visit: Payer: Self-pay

## 2019-11-03 ENCOUNTER — Ambulatory Visit: Payer: Self-pay

## 2019-11-03 DIAGNOSIS — G4733 Obstructive sleep apnea (adult) (pediatric): Secondary | ICD-10-CM

## 2019-11-03 DIAGNOSIS — I10 Essential (primary) hypertension: Secondary | ICD-10-CM

## 2019-11-03 DIAGNOSIS — E1122 Type 2 diabetes mellitus with diabetic chronic kidney disease: Secondary | ICD-10-CM

## 2019-11-04 NOTE — Chronic Care Management (AMB) (Signed)
  Chronic Care Management   Outreach Note  11/04/2019 Name: [name] [name] MRN: [id number] DOB: [birth year]  Referred by: [name], MD Reason for referral : Chronic Care Management (FU Call - DM, HTN, GI)   An unsuccessful telephone outreach was attempted today. The patient was referred to the case management team for assistance with care management and care coordination.   Follow Up Plan: A HIPPA compliant phone message was left for the patient providing contact information and requesting a return call.  Telephone follow up appointment with care management team member scheduled for: 12/06/19  [name], RN, BSN, CCM Care Management Coordinator [facility] Care Management/[facility]  Direct Phone: [phone]

## 2019-11-07 ENCOUNTER — Telehealth: Payer: Self-pay

## 2019-11-07 NOTE — Telephone Encounter (Signed)
Pt LVM stating she received her Freestyle [place] and wanted to come in to receive help putting it on. Returned pt call LVM for her to stop by the office today we can assist her with it, or to call the office back

## 2019-11-08 ENCOUNTER — Telehealth: Payer: Self-pay

## 2019-11-08 NOTE — Telephone Encounter (Signed)
Patient came in for some one to show her how to use her meter. Pt encouraged to call the office with any concerns

## 2019-11-10 DIAGNOSIS — G4733 Obstructive sleep apnea (adult) (pediatric): Secondary | ICD-10-CM | POA: Diagnosis not present

## 2019-11-11 ENCOUNTER — Other Ambulatory Visit: Payer: Self-pay | Admitting: Cardiology

## 2019-11-14 DIAGNOSIS — E119 Type 2 diabetes mellitus without complications: Secondary | ICD-10-CM | POA: Diagnosis not present

## 2019-11-14 DIAGNOSIS — H04123 Dry eye syndrome of bilateral lacrimal glands: Secondary | ICD-10-CM | POA: Diagnosis not present

## 2019-11-14 DIAGNOSIS — H43812 Vitreous degeneration, left eye: Secondary | ICD-10-CM | POA: Diagnosis not present

## 2019-11-14 DIAGNOSIS — H25813 Combined forms of age-related cataract, bilateral: Secondary | ICD-10-CM | POA: Diagnosis not present

## 2019-11-14 LAB — HM DIABETES EYE EXAM

## 2019-11-14 NOTE — Telephone Encounter (Signed)
Rx request sent to pharmacy.  

## 2019-11-15 ENCOUNTER — Other Ambulatory Visit: Payer: Self-pay

## 2019-11-15 ENCOUNTER — Telehealth: Payer: Self-pay

## 2019-11-15 NOTE — Telephone Encounter (Signed)
The pt was told that Dr Allyne Gee wants to know if she is taking her rosuvastatin medication daily because she got a letter from [facility] that based off of the pt's refill history that the pt may not be compliant with the mediation.  The pt said yes she takes her medication every night and that she is picking up a refill today.

## 2019-11-23 ENCOUNTER — Ambulatory Visit
Admission: RE | Admit: 2019-11-23 | Discharge: 2019-11-23 | Disposition: A | Discharge status: Home / Self Care | Payer: Medicare Other | Source: Ambulatory Visit | Attending: Internal Medicine | Admitting: Internal Medicine

## 2019-11-23 ENCOUNTER — Other Ambulatory Visit: Payer: Self-pay

## 2019-11-23 DIAGNOSIS — Z1231 Encounter for screening mammogram for malignant neoplasm of breast: Secondary | ICD-10-CM

## 2019-12-01 DIAGNOSIS — Z794 Long term (current) use of insulin: Secondary | ICD-10-CM | POA: Diagnosis not present

## 2019-12-01 DIAGNOSIS — E1165 Type 2 diabetes mellitus with hyperglycemia: Secondary | ICD-10-CM | POA: Diagnosis not present

## 2019-12-02 DIAGNOSIS — G4733 Obstructive sleep apnea (adult) (pediatric): Secondary | ICD-10-CM | POA: Diagnosis not present

## 2019-12-06 ENCOUNTER — Ambulatory Visit: Payer: Self-pay

## 2019-12-06 ENCOUNTER — Telehealth: Payer: Self-pay

## 2019-12-06 ENCOUNTER — Other Ambulatory Visit: Payer: Self-pay

## 2019-12-06 DIAGNOSIS — I10 Essential (primary) hypertension: Secondary | ICD-10-CM

## 2019-12-06 DIAGNOSIS — N182 Chronic kidney disease, stage 2 (mild): Secondary | ICD-10-CM

## 2019-12-07 NOTE — Chronic Care Management (AMB) (Signed)
  Chronic Care Management   Outreach Note  12/07/2019 Name: [name] [name] MRN: [id number] DOB: 14-Sep-1950  Referred by: [name], MD Reason for referral : Chronic Care Management (FU RN CM Call )   An unsuccessful telephone outreach was attempted today. The patient was referred to the case management team for assistance with care management and care coordination.   Follow Up Plan: Telephone follow up appointment with care management team member scheduled for: 01/12/20  [name], RN, BSN, CCM Care Management Coordinator [facility] Care Management/[facility]  Direct Phone: [phone]

## 2019-12-19 ENCOUNTER — Other Ambulatory Visit: Payer: Self-pay | Admitting: Internal Medicine

## 2019-12-23 ENCOUNTER — Ambulatory Visit: Payer: Self-pay

## 2019-12-23 DIAGNOSIS — Z794 Long term (current) use of insulin: Secondary | ICD-10-CM

## 2019-12-23 DIAGNOSIS — E1122 Type 2 diabetes mellitus with diabetic chronic kidney disease: Secondary | ICD-10-CM

## 2019-12-23 DIAGNOSIS — I10 Essential (primary) hypertension: Secondary | ICD-10-CM

## 2019-12-23 NOTE — Chronic Care Management (AMB) (Signed)
  Chronic Care Management   Outreach Note  12/23/2019 Name: [name] [name] MRN: [id number] DOB: 1951-02-14  Referred by: [name], MD Reason for referral : Care Coordination   SW placed an unsuccessful outbound call to the patient in a response to a voice message received from the patient requesting a return call. SW left a HIPAA compliant voice message requesting a return call.  Follow Up Plan: No SW follow up planned at this time. SW will be happy to engage with the patient upon return call.  Bevelyn Ngo, BSW, CDP Social Worker, Certified Dementia Practitioner [facility] Care Management [phone]

## 2019-12-31 DIAGNOSIS — Z794 Long term (current) use of insulin: Secondary | ICD-10-CM | POA: Diagnosis not present

## 2019-12-31 DIAGNOSIS — E1165 Type 2 diabetes mellitus with hyperglycemia: Secondary | ICD-10-CM | POA: Diagnosis not present

## 2020-01-01 DIAGNOSIS — G4733 Obstructive sleep apnea (adult) (pediatric): Secondary | ICD-10-CM | POA: Diagnosis not present

## 2020-01-02 ENCOUNTER — Ambulatory Visit (INDEPENDENT_AMBULATORY_CARE_PROVIDER_SITE_OTHER): Payer: Medicare Other

## 2020-01-02 ENCOUNTER — Ambulatory Visit: Payer: Self-pay

## 2020-01-02 ENCOUNTER — Other Ambulatory Visit: Payer: Self-pay

## 2020-01-02 DIAGNOSIS — I10 Essential (primary) hypertension: Secondary | ICD-10-CM

## 2020-01-02 DIAGNOSIS — N182 Chronic kidney disease, stage 2 (mild): Secondary | ICD-10-CM | POA: Diagnosis not present

## 2020-01-02 DIAGNOSIS — E1122 Type 2 diabetes mellitus with diabetic chronic kidney disease: Secondary | ICD-10-CM | POA: Diagnosis not present

## 2020-01-02 DIAGNOSIS — Z794 Long term (current) use of insulin: Secondary | ICD-10-CM

## 2020-01-02 MED ORDER — FREESTYLE LIBRE 14 DAY SENSOR MISC: 2 refills | Status: DC

## 2020-01-02 NOTE — Chronic Care Management (AMB) (Signed)
  Chronic Care Management   Outreach Note  01/02/2020 Name: [name] [name] MRN: [id number] DOB: 05-Jul-1950  Referred by: [name], MD Reason for referral : Care Coordination   SW placed successful outbound call to the patient in response to voice message received requesting SW assistance. The patient reports her [name] [name] supplies are going to cost $67 which she can not afford. SW advised the patient, SW was unable to assist with medication cost concerns but could like the patient with embedded PharmD, Beryle Flock for assistance. The patient reports she has spoken with [name] and is awaiting a return call.  Follow Up Plan: Collaboration with PharmD to confirm patient engagement. No further SW needs at this time.  Bevelyn Ngo, BSW, CDP Social Worker, Certified Dementia Practitioner [facility] Care Management [phone]

## 2020-01-02 NOTE — Chronic Care Management (AMB) (Signed)
   Chronic Care Management Pharmacy  Name: [name] [name]  MRN: [id number] DOB: April 26, 1951  Chief Complaint/ HPI  [name],  69 y.o. , female called with a question about her FreeStyle [place] sensors. Patient states that when she went to pick up the sensors at the pharmacy they were $67. Per pharmacist Raynelle Fanning Pruitt's note from 09/14/19, FreeStyle Josephine Igo must be filled through a DME company and not a Insurance claims handler. Collaborated with patient's PCP and requested prescription be sent to [facility] for the sensors. Called and notified patient over the phone and advised her she could pick up samples of the sensors at the office until the sensors from the DME pharmacy arrive.   PCP : [name], MD   Goals Addressed            This Visit's Progress   . Assistance with obtaining FreeStyle [place] sensors       CARE PLAN ENTRY (see longitudinal plan of care for additional care plan information)  Current Barriers:  . Financial Barriers: patient has [employer] and reports copay for [employer] sensors is cost prohibitive at this time  Pharmacist Clinical Goal(s):  Marland Kitchen Over the next 30 days, patient will work with PharmD and providers to relieve medication access concerns  Interventions: . Inter-disciplinary care team collaboration (see longitudinal plan of care) . Collaborated with PCP to coordinate prescription for FreeStyle [place] 2 sensors be sent in the [place] pharmacy . Notified patient prescription is being sent to DME pharmacy and advised patient she could pick up samples at the office  Patient Self Care Activities:  . Pick up samples of sensors . Provide any necessary information to [facility] needed to coordinate delivery  Initial goal documentation       Follow up: Initial visit to be scheduled  Beryle Flock, PharmD Clinical Pharmacist [facility] [phone]

## 2020-01-02 NOTE — Patient Instructions (Signed)
Visit Information  Goals Addressed            This Visit's Progress   . Assistance with obtaining FreeStyle [place] sensors       CARE PLAN ENTRY (see longitudinal plan of care for additional care plan information)  Current Barriers:  . Financial Barriers: patient has [employer] and reports copay for [employer] sensors is cost prohibitive at this time  Pharmacist Clinical Goal(s):  Marland Kitchen Over the next 30 days, patient will work with PharmD and providers to relieve medication access concerns  Interventions: . Inter-disciplinary care team collaboration (see longitudinal plan of care) . Collaborated with PCP to coordinate prescription for FreeStyle [place] 2 sensors be sent in the [place] pharmacy . Notified patient prescription is being sent to DME pharmacy and advised patient she could pick up samples at the office  Patient Self Care Activities:  . Pick up samples of sensors . Provide any necessary information to [facility] needed to coordinate delivery  Initial goal documentation        [name] [name] was given information about Chronic Care Management services today including:  1. CCM service includes personalized support from designated clinical staff supervised by her physician, including individualized plan of care and coordination with other care providers 2. 24/7 contact phone numbers for assistance for urgent and routine care needs. 3. Standard insurance, coinsurance, copays and deductibles apply for chronic care management only during months in which we provide at least 20 minutes of these services. Most insurances cover these services at 100%, however patients may be responsible for any copay, coinsurance and/or deductible if applicable. This service may help you avoid the need for more expensive face-to-face services. 4. Only one practitioner may furnish and bill the service in a calendar month. 5. The patient may stop CCM services at any time  (effective at the end of the month) by phone call to the office staff.  Patient agreed to services and verbal consent obtained.   Patient verbalizes understanding of instructions provided today.  Initial visit with pharmacist to be scheduled  Beryle Flock, PharmD Clinical Pharmacist [facility] [phone]

## 2020-01-03 ENCOUNTER — Other Ambulatory Visit: Payer: Self-pay

## 2020-01-03 ENCOUNTER — Telehealth: Payer: Self-pay | Admitting: Internal Medicine

## 2020-01-03 MED ORDER — FREESTYLE LIBRE 2 SENSOR MISC: 1 refills | Status: DC

## 2020-01-03 MED ORDER — FREESTYLE LIBRE 2 READER DEVI: 1 refills | Status: DC

## 2020-01-03 NOTE — Chronic Care Management (AMB) (Signed)
  Chronic Care Management   Outreach Note  01/03/2020 Name: Nathalia Wismer MRN: [id number] DOB: 1950-10-12  [name] [name] is a 69 y.o. year old female who is a primary care patient of [name], MD. I reached out to [name] by phone today in response to a referral sent by Ms. Bonita Quin Cafarelli's PCP, [name]     An unsuccessful telephone outreach was attempted today. The patient was referred to the case management team for assistance with care management and care coordination.   Follow Up Plan: A HIPPA compliant phone message was left for the patient providing contact information and requesting a return call. The care management team will reach out to the patient again over the next 7 days. If patient returns call to provider office, please advise to call Embedded Care Management Care Guide Gwenevere Ghazi at [phone].  Gwenevere Ghazi  Care Guide, Embedded Care Coordination [facility]  [place], [place] [address] Direct Dial: [phone] [email or web address]: [email or web address]

## 2020-01-03 NOTE — Chronic Care Management (AMB) (Signed)
  Chronic Care Management   Note  01/03/2020 Name: [name] [name] MRN: [id number] DOB: 04/27/1951  [name] [name] is a 69 y.o. year old female who is a primary care patient of [name], MD and is actively engaged with the care management team. I reached out to [name] by phone today to assist with scheduling an initial visit with the Pharmacist.  Follow up plan: Telephone appointment with care management team member scheduled for: 01/25/2020.  Gwenevere Ghazi  Care Guide, Embedded Care Coordination [facility]  [place], [place] [address] Direct Dial: [phone] [email or web address]: [email or web address]

## 2020-01-12 ENCOUNTER — Ambulatory Visit: Payer: Self-pay

## 2020-01-12 ENCOUNTER — Telehealth: Payer: Medicare Other

## 2020-01-12 ENCOUNTER — Other Ambulatory Visit: Payer: Self-pay

## 2020-01-12 DIAGNOSIS — I10 Essential (primary) hypertension: Secondary | ICD-10-CM

## 2020-01-12 DIAGNOSIS — E1122 Type 2 diabetes mellitus with diabetic chronic kidney disease: Secondary | ICD-10-CM

## 2020-01-13 NOTE — Chronic Care Management (AMB) (Signed)
Chronic Care Management   Follow Up Note   01/13/2020 Name: [name] [name] MRN: [id number] DOB: Mar 23, 1951  Referred by: [name], MD Reason for referral : Chronic Care Management (FU RN CM Chart Review )   [name] [name] is a 69 y.o. year old female who is a primary care patient of [name], MD. The CCM team was consulted for assistance with chronic disease management and care coordination needs.    Review of patient status, including review of consultants reports, relevant laboratory and other test results, and collaboration with appropriate care team members and the patient's provider was performed as part of comprehensive patient evaluation and provision of chronic care management services.    SDOH (Social Determinants of Health) assessments performed: No See Care Plan activities for detailed interventions related to [facility])   Reviewed chart in preparation to contact patient to assess for CCM needs.     Outpatient Encounter Medications as of 01/12/2020  Medication Sig  . Accu-Chek FastClix Lancets MISC CHECK BLOOD SUGAR THREE TIMES DAILY dx: e11.65  . amLODipine (NORVASC) 10 MG tablet TAKE 1 TABLET(10 MG) BY MOUTH DAILY  . aspirin 81 MG tablet Take 81 mg by mouth daily.  . beta carotene 44010 UNIT capsule Take 25,000 Units by mouth daily.  . Cholecalciferol ([employer] VIT D 5000 HIGH-POTENCY PO) Take 1 tablet by mouth daily.   . CHROMIUM PICOLINATE PO Take 200 mcg by mouth daily.   . Coenzyme Q10 (COQ-10) 100 MG CAPS Take 1 tablet by mouth daily.   . Continuous Blood Gluc Receiver (FREESTYLE LIBRE 2 READER) DEVI Use as directed to check blood sugars 3 times per day dx: e11.65  . Continuous Blood Gluc Sensor (FREESTYLE LIBRE 2 SENSOR) MISC Use as directed to check blood sugars 3 times per day dx: e11.65  . Cyanocobalamin (VITAMIN B-12 PO) Take 500 mcg by mouth.   . Dulaglutide (TRULICITY) 1.5 MG/0.5ML SOPN Inject 1.5 mg into the skin once a week.  Marland Kitchen glucosamine-chondroitin  500-400 MG tablet Take 1 tablet by mouth daily. Reported on 12/17/2015  . glucose blood (ONETOUCH VERIO) test strip CHECK BLOOD SUGAR THREE TIMES DAILY dx: e11.65  . Insulin Aspart, w/Niacinamide, (FIASP FLEXTOUCH [place]) Inject into the skin. Use sliding scale as directed three times daily. Inject 2 units for BG 150-199, 4 units for BG 200-249, 6 units for BG 250-299, 8 units for BG 300-340, 10 units for BG>350, BG>400 call MD  . insulin degludec (TRESIBA FLEXTOUCH) 200 UNIT/ML FlexTouch Pen Inject 26 Units into the skin daily. Max dose is 80 units  . Insulin Pen Needle (BD PEN NEEDLE NANO U/F) 32G X 4 MM MISC USE TO INJECT INSULIN DAILY  . Krill Oil 350 MG CAPS Take 350 mg by mouth daily.   . magnesium oxide (MAG-OX) 400 MG tablet Take 400 mg by mouth daily.  . metoprolol tartrate (LOPRESSOR) 50 MG tablet TAKE 1 TABLET BY MOUTH TWICE DAILY  . nystatin (NYSTATIN) powder Apply topically 3 (three) times daily. As needed  . omeprazole (PRILOSEC) 20 MG capsule TAKE 1 CAPSULE(20 MG) BY MOUTH DAILY  . ONETOUCH DELICA LANCETS 33G MISC USE AS DIRECTED TO CHECK BLOOD SUGAR THREE TIMES DAILY  . pregabalin (LYRICA) 50 MG capsule TAKE 1 CAPSULE(50 MG) BY MOUTH TWICE DAILY  . Psyllium (METAMUCIL FIBER PO) Take by mouth.  . rosuvastatin (CRESTOR) 20 MG tablet Take 1 tablet (20 mg total) by mouth daily. Please schedule appointment with Dr. Antoine Poche for refills.  . traZODone (DESYREL) 50 MG  tablet TAKE 1 TABLET BY MOUTH EVERY NIGHT AT BEDTIME  . vitamin C (ASCORBIC ACID) 500 MG tablet Take 1,000 mg by mouth 2 (two) times daily.   . Vitamin D, Ergocalciferol, (DRISDOL) 1.25 MG (50000 UNIT) CAPS capsule TAKE 1 CAPSULE BY MOUTH EVERY 7 DAYS  . vitamin E 200 UNIT capsule Take 200 Units by mouth daily.   No facility-administered encounter medications on file as of 01/12/2020.     Objective:  Lab Results  Component Value Date   HGBA1C 7.9 (H) 10/25/2019   HGBA1C 8.1 (H) 08/10/2019   HGBA1C 7.9 (H) 05/05/2019   Lab  Results  Component Value Date   MICROALBUR 30 03/23/2019   LDLCALC 62 08/10/2019   CREATININE 0.84 10/25/2019   BP Readings from Last 3 Encounters:  10/25/19 128/76  09/12/19 122/84  08/10/19 126/82   Plan:   Telephone follow up appointment with care management team member scheduled for: 03/30/20  [name], RN, BSN, CCM Care Management Coordinator [facility] Care Management/[facility]  Direct Phone: [phone]

## 2020-01-18 ENCOUNTER — Encounter: Payer: Self-pay | Admitting: Internal Medicine

## 2020-01-23 ENCOUNTER — Other Ambulatory Visit: Payer: Self-pay | Admitting: Internal Medicine

## 2020-01-23 ENCOUNTER — Telehealth: Payer: Self-pay

## 2020-01-23 NOTE — Telephone Encounter (Signed)
The pt was told that the pharmacist courtney said that [name] will have her [name] [name] 2 Sensor delivered to the pt in 2 weeks and that its a sample for [name] 2 and fiasp ready for pickup.

## 2020-01-25 ENCOUNTER — Ambulatory Visit (INDEPENDENT_AMBULATORY_CARE_PROVIDER_SITE_OTHER): Payer: Medicare Other

## 2020-01-25 ENCOUNTER — Other Ambulatory Visit: Payer: Self-pay

## 2020-01-25 DIAGNOSIS — E785 Hyperlipidemia, unspecified: Secondary | ICD-10-CM | POA: Diagnosis not present

## 2020-01-25 DIAGNOSIS — E1122 Type 2 diabetes mellitus with diabetic chronic kidney disease: Secondary | ICD-10-CM | POA: Diagnosis not present

## 2020-01-25 DIAGNOSIS — I10 Essential (primary) hypertension: Secondary | ICD-10-CM | POA: Diagnosis not present

## 2020-01-25 DIAGNOSIS — Z794 Long term (current) use of insulin: Secondary | ICD-10-CM | POA: Diagnosis not present

## 2020-01-25 DIAGNOSIS — N182 Chronic kidney disease, stage 2 (mild): Secondary | ICD-10-CM | POA: Diagnosis not present

## 2020-01-25 NOTE — Chronic Care Management (AMB) (Signed)
Chronic Care Management Pharmacy  Name: [name] [name]  MRN: [id number] DOB: 02/05/1951  Chief Complaint/ HPI  [name] [name],  69 y.o. , female presents for their Initial CCM visit with the clinical pharmacist via telephone due to COVID-19 Pandemic.  PCP : Glendale Chard, MD  Their chronic conditions include: Hypertension, Hyperlipidemia and Diabetes  Office Visits: 01/23/20 Telephone call: Pt provided with sample of Freestyle Libre2 sensor and Fiasp. Pt awaiting delivery of Freestyle Libre from [place].   11/08/19 Pt came into the office for assistance using FreeStyle Libre meter. Encouraged to call with any further questions  10/25/19 OV: Hepatitis C antibody was positive with low viral load, no active infection. Liver and kidney function stable. HgbA1c has improved to 7.9%.  09/12/19 OV: Presented for evaluation of fatigue and body aches. Feels better today. Changed filter in CPAP and is sleeping better. Planning to discuss initiating duloxetine for arthralgia, but pt thinks it's not necessary at this time. Recommend antiinflammatory diet. CPAP compliance and care instructions discussed. Referred to Nutrition and Diabetic services. Started on sliding scale insulin. Check BG before each meal.   08/24/19 Telephone call: Notified pt that stool cards were positive for blood. Will refer to [facility] for follow up.   08/10/19 OV: Pt noted blood on toilet paper but unsure if from vagina or rectum, no blood in toilet. Complains of intermittent, stabbing headaches. Headache possibly due to sinus congestion. Will let PCP know if symptoms persist. Kidney function normal. HgbA1c elevated to 8.1% and glucose high at 230 today. Cholesterol is good. Discussed importance of statin compliance and exercise. Heart heathy diet recommended. Advised to use Miralax daily for constipation. If ineffective will consider Linzess. Advised to increase water and fiber intake.   Consult Visits: 10/17/19 Nutrition OV: Pt  doing better. Recommend dried beans, any vegetables, nuts. Drink plenty of water. Stay physically active.   09/16/19 Nutrition OV: Plan for pt to attend individual and/or group session. Work on Child psychotherapist. Exercise. Eat balanced meals and consider having protein every morning.  CCM Encounters: 09/14/19 RN and PharmD: Pt called stating Libre scanner is $137 at pharmacy. Collaboration with embedded PharmD. Elenor Legato Rx will need to be resent to [facility] and should include required clinical notes per PCP. Libre cannot be filled at [facility].   09/12/19 SW: Requested referral from PCP for diabetic eye exam.   09/08/19 RN: Pt called stating she does not feel well and BG has been elevated. Reviewed and discussed treatment plans for blood in stool, finding eye doctor, elevated BG, and anxiety.   09/06/19 PharmD: Plan to initiate Farxiga 36m at upcoming PCP appt on 09/12/19.   07/29/19 PharmD: Pt missed Trulicity dose and called to see when to take. Advised to administer today.   Medications: Outpatient Encounter Medications as of 01/25/2020  Medication Sig  . Accu-Chek FastClix Lancets MISC CHECK BLOOD SUGAR THREE TIMES DAILY dx: e11.65  . amLODipine (NORVASC) 10 MG tablet TAKE 1 TABLET(10 MG) BY MOUTH DAILY  . aspirin 81 MG tablet Take 81 mg by mouth daily.  . Coenzyme Q10 (COQ-10) 100 MG CAPS Take 1 tablet by mouth daily.   . Continuous Blood Gluc Receiver (FREESTYLE LIBRE 2 READER) DEVI Use as directed to check blood sugars 3 times per day dx: e11.65  . Continuous Blood Gluc Sensor (FREESTYLE LIBRE 2 SENSOR) MISC Use as directed to check blood sugars 3 times per day dx: e11.65  . Cyanocobalamin (VITAMIN B-12 PO) Take 500 mcg by mouth.   .Marland Kitchen  Flaxseed, Linseed, (FLAXSEED OIL) 1000 MG CAPS Take 1 capsule by mouth daily.  Marland Kitchen glucosamine-chondroitin 500-400 MG tablet Take 1 tablet by mouth daily. Reported on 12/17/2015  . glucose blood (ONETOUCH VERIO) test strip CHECK BLOOD SUGAR THREE TIMES DAILY dx:  e11.65  . Insulin Aspart, w/Niacinamide, (FIASP FLEXTOUCH [place]) Inject into the skin. Use sliding scale as directed three times daily. Inject 2 units for BG 150-199, 4 units for BG 200-249, 6 units for BG 250-299, 8 units for BG 300-340, 10 units for BG>350, BG>400 call MD  . insulin degludec (TRESIBA FLEXTOUCH) 200 UNIT/ML FlexTouch Pen Inject 26 Units into the skin daily. Max dose is 80 units  . MAGNESIUM OXIDE PO Take 500 mg by mouth daily.   . metoprolol tartrate (LOPRESSOR) 50 MG tablet TAKE 1 TABLET BY MOUTH TWICE DAILY  . Misc Natural Products (NEURIVA) CAPS Take 1 capsule by mouth daily.  Marland Kitchen omeprazole (PRILOSEC) 20 MG capsule TAKE 1 CAPSULE(20 MG) BY MOUTH DAILY  . ONETOUCH DELICA LANCETS 73A MISC USE AS DIRECTED TO CHECK BLOOD SUGAR THREE TIMES DAILY  . pregabalin (LYRICA) 50 MG capsule TAKE 1 CAPSULE(50 MG) BY MOUTH TWICE DAILY  . Psyllium (METAMUCIL FIBER PO) Take by mouth.  . traZODone (DESYREL) 50 MG tablet TAKE 1 TABLET BY MOUTH EVERY NIGHT AT BEDTIME  . TRULICITY 1.5 LP/3.7TK SOPN ADMINISTER 1.5 MG UNDER THE SKIN 1 TIME A WEEK  . Vitamin A 3 MG (10000 UT) TABS Take 1 tablet by mouth daily.  . Vitamin D, Ergocalciferol, (DRISDOL) 1.25 MG (50000 UNIT) CAPS capsule TAKE 1 CAPSULE BY MOUTH EVERY 7 DAYS  . vitamin E 600 UNIT capsule Take 400 Units by mouth daily.   . [DISCONTINUED] Insulin Pen Needle (BD PEN NEEDLE NANO U/F) 32G X 4 MM MISC USE TO INJECT INSULIN DAILY  . [DISCONTINUED] rosuvastatin (CRESTOR) 20 MG tablet Take 1 tablet (20 mg total) by mouth daily. Please schedule appointment with Dr. Percival Spanish for refills.  . beta carotene 25000 UNIT capsule Take 25,000 Units by mouth daily.   . Cholecalciferol ([employer] VIT D 5000 HIGH-POTENCY PO) Take 1 tablet by mouth daily.  (Patient not taking: Reported on 02/02/2020)  . CHROMIUM PICOLINATE PO Take 200 mcg by mouth daily.   Javier Docker Oil 350 MG CAPS Take 350 mg by mouth daily.  (Patient not taking: Reported on 02/02/2020)  . nystatin  (NYSTATIN) powder Apply topically 3 (three) times daily. As needed  . vitamin C (ASCORBIC ACID) 500 MG tablet Take 1,000 mg by mouth 2 (two) times daily.    No facility-administered encounter medications on file as of 01/25/2020.    Current Diagnosis/Assessment:  SDOH Interventions     Most Recent Value  SDOH Interventions  Financial Strain Interventions Other (Comment)  [Assist patient with obtaining Freestyle Libre sensors from DME company]      Goals Addressed            This Visit's Progress   . Pharmacy Care Plan       CARE PLAN ENTRY (see longitudinal plan of care for additional care plan information)  Current Barriers:  . Chronic Disease Management support, education, and care coordination needs related to Hypertension, Hyperlipidemia, and Diabetes   Hypertension BP Readings from Last 3 Encounters:  10/25/19 128/76  09/12/19 122/84  08/10/19 126/82   . Pharmacist Clinical Goal(s): o Over the next 180 days, patient will work with PharmD and providers to maintain BP goal <130/80 . Current regimen:  . Amlodipine 48m daily .  Metoprolol tartrate 48m twice daily . Interventions: o Recommend patient check blood pressure a few times per month and if symptomatic . Patient self care activities - Over the next 180 days, patient will: o Check BP several times monthly and if symptomatic, document, and provide at future appointments o Ensure daily salt intake < 2300 mg/day  Hyperlipidemia Lab Results  Component Value Date/Time   LDLCALC 62 08/10/2019 10:57 AM   . Pharmacist Clinical Goal(s): o Over the next 180 days, patient will work with PharmD and providers to maintain LDL goal < 70 . Current regimen:  o Rosuvastatin 278mdaily in the evening o Coenzyme Q10 10023maily . Interventions: o Discussed appropriate goals for triglycerides (less than 150) o Discussed the importance of medication adherence . Patient self care activities - Over the next 180 days, patient  will: o Take medication every day as directed  Diabetes Lab Results  Component Value Date/Time   HGBA1C 7.9 (H) 10/25/2019 10:37 AM   HGBA1C 8.1 (H) 08/10/2019 10:57 AM   . Pharmacist Clinical Goal(s): o Over the next 90 days, patient will work with PharmD and providers to achieve A1c goal <7% . Current regimen:  . Trulicity  1.51.8EXce weekly . Tresiba 26 units daily . Fiasp per sliding scale three times daily . Interventions: o Discussed appropriate administration of Fiasp (at the start or within 20 minutes of starting meal) o Assisting patient with obtaining FreeStyle Libre sensors through [employer] [facility] self care activities - Over the next 90 days, patient will: o Check blood sugar 3-4 times daily, document, and provide at future appointments o Contact provider with any episodes of hypoglycemia  Medication management . Pharmacist Clinical Goal(s): o Over the next 180 days, patient will work with PharmD and providers to maintain optimal medication adherence . Current pharmacy: [facility] . Interventions o Comprehensive medication review performed. o Continue current medication management strategy o Reviewed over the counter supplements and discussed what supplements could be stopped - Patient stopping beta-carotene, Vitamin D3, chromium picolinate - Patient will start Vitamin C 500m74mily . Patient self care activities - Over the next 180 days, patient will: o Focus on medication adherence by continued use of pill box o Take medications as prescribed o Report any questions or concerns to PharmD and/or provider(s)  Initial goal documentation        Diabetes   A1c goal <7%  Recent Relevant Labs: Lab Results  Component Value Date/Time   HGBA1C 7.9 (H) 10/25/2019 10:37 AM   HGBA1C 8.1 (H) 08/10/2019 10:57 AM   MICROALBUR 30 03/23/2019 11:44 AM   MICROALBUR 30 08/19/2018 12:39 PM    Last diabetic Eye exam:  Lab Results  Component Value Date/Time    HMDIABEYEEXA No Retinopathy 11/14/2019 12:00 AM    Last diabetic Foot exam: 03/23/19  Checking BG: 3x per Day  Recent FBG Readings: None provided today Recent pre-meal BG readings:  Recent 2hr PP BG readings:   Recent HS BG readings:   Patient has failed these meds in past: Glimepiride, Novolog, Lantus, Januvia, Janumet, Metformin Patient is currently uncontrolled on the following medications: . Trulicity  1.8m9.3ZJe weekly . Tresiba 26 units daily . Fiasp per sliding scale three times daily  We discussed:  . Pt administers Fiasp per sliding scale when BG goes over 150/160 . Checks BG before breakfast, lunch, and dinner and 1 hour after breakfast, 2-3 hours after lunch, and after dinner.  . Discussed appropriate time check BG and administer  Rosalia Hammers should be administered at the start of or within 20 minutes of starting meal. Pt states that is what she does . Assisted pt with coordination of FreeStyle [place] delivery from [place] on Friday 7/30 (sensors should be delivered in 5-7 business days) . Pt takes Trulicity on Thursdays, and Tresiba nightly at [facility] current medications  Discuss diet and exercise at follow up  Hyperlipidemia   LDL goal < 70  Lipid Panel     Component Value Date/Time   CHOL 134 08/10/2019 1057   TRIG 169 (H) 08/10/2019 1057   HDL 44 08/10/2019 1057   LDLCALC 62 08/10/2019 1057    Hepatic Function Latest Ref Rng & Units 10/25/2019 01/26/2019 08/19/2018  Total Protein 6.0 - 8.5 g/dL 7.2 7.6 7.3  Albumin 3.8 - 4.8 g/dL 4.5 4.3 4.2  AST 0 - 40 IU/L _0 ALT 0 - 32 IU/L _1 Alk Phosphatase 39 - 117 IU/L 179(H) 134(H) 190(H)  Total Bilirubin 0.0 - 1.2 mg/dL 0.3 0.3 0.3    The 10-year ASCVD risk score Mikey Bussing DC Jr., et al., 2013) is: 20.6%   Values used to calculate the score:     Age: 51 years     Sex: Female     Is Non-Hispanic African American: Yes     Diabetic: Yes     Tobacco smoker: No     Systolic Blood  Pressure: 138 mmHg     Is BP treated: Yes     HDL Cholesterol: 44 mg/dL     Total Cholesterol: 134 mg/dL   Patient has failed these meds in past: N/A Patient is currently controlled on the following medications:  . Rosuvastatin 74m daily in the evening . Coenzyme Q10 1013mdaily  We discussed:   . Pt endorses 99% medication adherence . Triglycerides above goal <150  Plan Continue current medications  Discuss diet and exercise at follow up  Hypertension   BP goal is:  <130/80  Office blood pressures are  BP Readings from Last 3 Encounters:  02/02/20 138/90  10/25/19 128/76  09/12/19 122/84   Patient checks BP at home infrequently Patient home BP readings are ranging: Pt says normally good  Patient has failed these meds in the past: olmesartan, olmesartan/HCTZ, chlorthalidone, HCTZ,  Patient is currently controlled on the following medications:  . Amlodipine 1025maily . Metoprolol tartrate 34m73mice daily  We discussed: . Pt does not check BP often, but usually good . Recommend pt check BP periodically (a few times monthly) and if symptomatic  Plan Continue current medications  Discuss diet and exercise at follow up  Neuropathy   Patient has failed these meds in past: Gabapentin Patient is currently controlled on the following medications:   Pregabalin 34mg68mce daily  We discussed:    Pt takes Lyrica for tingling under her skin  Plan Continue current medications  Osteopenia / Osteoporosis   Last DEXA Scan: 2011- unable to find results  T-Score femoral neck:   T-Score total hip:  T-Score lumbar spine:   T-Score forearm radius:   10-year probability of major osteoporotic fracture:   10-year probability of hip fracture:   Vit D, 25-Hydroxy  Date Value Ref Range Status  03/23/2019 32.5 30.0 - 100.0 ng/mL Final    Comment:    Vitamin D deficiency has been defined by the [place] [employer] practice guideline as a level  of serum 25-OH vitamin  D less than 20 ng/mL (1,2). The Endocrine Society went on to further define vitamin D insufficiency as a level between 21 and 29 ng/mL (2). 1. IOM (Institute of Medicine). 2010. Dietary reference    intakes for calcium and D. [place]: The    [employer]. 2. Holick MF, Binkley Gaylord, Bischoff-Ferrari HA, et al.    Evaluation, treatment, and prevention of vitamin D    deficiency: an Endocrine Society clinical practice    guideline. JCEM. 2011 Jul; 96(7):1911-30.    Patient has failed these meds in past: N/A Patient is currently controlled on the following medications:  . Ergocalciferol 50,000 units once weekly  We discussed:  Recommend 1200 mg of calcium daily from dietary and supplemental sources.   Advised pt that she should take calcium citrate which will have the best absorption since patient is on omeprazole  Discussed that patient does not need to take Vitamin D3 since she is already on Vitamin D2  Pt takes ergocalciferol on Saturdays  Plan Continue current medications  Recommend DEXA scan  GERD   Patient has failed these meds in past: N/A Patient is currently controlled on the following medications:   Omeprazole 3m daily  We discussed:    Pt states that this helps tremendously with her reflux symptoms  Plan Continue current medications  Insomnia   Patient has failed these meds in past: Temazepam, Lunesta Patient is currently controlled on the following medications:   Trazodone 562mat bedtime  We discussed:   Pt reports that she takes trazodone 4-5 nights per week  She says that if she takes it every night it makes her too drowsy  Using CPAP helps with her sleep  Plan Continue current medications  Health Maintenance   Patient is currently on the following medications:  . Marland Kitchenlucosamine-Chondroitin 500-40070maily . Vitamin E 400 units daily . Magnesium oxide 500m32mily . Nystatin powder three times daily as  needed . Metamucil daily . Vitamin A 10000 units daily . Neuriva 1 capsule daily . Vitamin B12 500mc32mily . Flaxseed oil 1000mg 8my . Aspirin 81mg d58m  We discussed:    Reviewed all supplements . Stopped taking Vitamin C because it can cause a false reading with FreeStyle Libre o Pt will start taking Vitamin C 500mg . 42msked which supplements she does not need to take . Pt has run out of beta-carotene. Discussed that this is a precursor to Vitamin A, which pt is already taking. . Pt has run out of krill oil, is going to continue just taking flaxseed oil . Pt has run out of chromium picolinate. Advised pt that this supplement is not necessary. It may help with blood sugar, but it is not going to replace her diabetic medications at this point.   Plan Continue current medications as listed  Recommend Vitamin B12 level  Vaccines   Reviewed and discussed patient's vaccination history.    Immunization History  Administered Date(s) Administered  . Fluad Quad(high Dose 65+) 03/04/2019  . Influenza, High Dose Seasonal PF 03/21/2017, 03/11/2018  . PFIZER SARS-COV-2 Vaccination 07/30/2019, 08/20/2019  . Pneumococcal Conjugate-13 10/23/2016  . Zoster Recombinat (Shingrix) 01/25/2019, 03/30/2019   Plan Discuss at follow up visit  Medication Management   [facility] Uses pill box? Yes Pt endorses 99% compliance  We discussed:  . Importance of taking each medications daily as directed . Pt does not miss doses often . Discussed indications for all medications  Plan  Continue current medication management strategy  Discuss medication synchronization and adherence packaging at follow up  Follow up: 1 month phone visit  Jannette Fogo, PharmD Clinical Pharmacist [facility] [phone]

## 2020-01-26 ENCOUNTER — Encounter (HOSPITAL_COMMUNITY): Payer: Self-pay | Admitting: Emergency Medicine

## 2020-01-26 ENCOUNTER — Telehealth: Payer: Self-pay

## 2020-01-26 ENCOUNTER — Other Ambulatory Visit: Payer: Self-pay

## 2020-01-26 ENCOUNTER — Emergency Department (HOSPITAL_COMMUNITY)
Admission: EM | Admit: 2020-01-26 | Discharge: 2020-01-26 | Disposition: A | Discharge status: Home / Self Care | Payer: Medicare Other | Attending: Emergency Medicine | Admitting: Emergency Medicine

## 2020-01-26 DIAGNOSIS — Z5321 Procedure and treatment not carried out due to patient leaving prior to being seen by health care provider: Secondary | ICD-10-CM | POA: Insufficient documentation

## 2020-01-26 DIAGNOSIS — R519 Headache, unspecified: Secondary | ICD-10-CM | POA: Diagnosis not present

## 2020-01-26 LAB — CBG MONITORING, ED: Glucose-Capillary: 102 mg/dL — ABNORMAL HIGH (ref 70–99)

## 2020-01-26 NOTE — Telephone Encounter (Signed)
Returned call to pt. She stated that she left the er bc of the wait and that she was feeling weak from not taking her medication. I encouraged her to return to the er or go to urgent care. Pt is understanding and stated that she would go if her headache got worse

## 2020-01-26 NOTE — ED Notes (Signed)
Per registration, patient turned in labels and reports she is leaving. 

## 2020-01-26 NOTE — Telephone Encounter (Signed)
The patient said that she is experiencing sharp pains in the top of her head.   The pt said that the pain has been sharp since 4:30 am. The pt was told to go to the ER for evaluation of the sharp pains that she is having.

## 2020-01-26 NOTE — ED Triage Notes (Signed)
Patient arrived by self from home. Patient c/o RT sided head pain that started yesterday at 1600.   Patient stated they took aspirin w/ no relief of pain.   Patient stated they have soreness w/ palpation of the scalp.   History of HTN.

## 2020-01-31 DIAGNOSIS — E1165 Type 2 diabetes mellitus with hyperglycemia: Secondary | ICD-10-CM | POA: Diagnosis not present

## 2020-01-31 DIAGNOSIS — Z794 Long term (current) use of insulin: Secondary | ICD-10-CM | POA: Diagnosis not present

## 2020-02-01 ENCOUNTER — Ambulatory Visit: Payer: Medicare Other | Admitting: Internal Medicine

## 2020-02-01 ENCOUNTER — Telehealth: Payer: Self-pay

## 2020-02-01 ENCOUNTER — Ambulatory Visit: Payer: Medicare Other

## 2020-02-01 DIAGNOSIS — G4733 Obstructive sleep apnea (adult) (pediatric): Secondary | ICD-10-CM | POA: Diagnosis not present

## 2020-02-02 ENCOUNTER — Other Ambulatory Visit: Payer: Self-pay

## 2020-02-02 ENCOUNTER — Ambulatory Visit (INDEPENDENT_AMBULATORY_CARE_PROVIDER_SITE_OTHER): Payer: Medicare Other | Admitting: Nurse Practitioner

## 2020-02-02 VITALS — BP 138/90 | HR 72 | Temp 98.0°F | Ht 66.0 in | Wt 192.0 lb

## 2020-02-02 DIAGNOSIS — R519 Headache, unspecified: Secondary | ICD-10-CM | POA: Diagnosis not present

## 2020-02-02 NOTE — Progress Notes (Signed)
I,Katawbba Wiggins,acting as a Neurosurgeon for [employer], FNP.,have documented all relevant documentation on the behalf of Arnette Felts, FNP,as directed by  Arnette Felts, FNP while in the presence of Arnette Felts, FNP.  This visit occurred during the SARS-CoV-2 public health emergency.  Safety protocols were in place, including screening questions prior to the visit, additional usage of staff PPE, and extensive cleaning of exam room while observing appropriate contact time as indicated for disinfecting solutions.  Subjective:     Patient ID: [name] [name] , female    DOB: 01-17-1951 , 69 y.o.   MRN: [id number]   Chief Complaint  Patient presents with  . Headache    HPI  The patient is here for evaluation of headaches.  The patient states she would like to have a CT of her head done.  She had the headache for about 5 days.  She took advil for her headaches but none since Sunday.    Headache  This is a new problem. The current episode started 1 to 4 weeks ago. The problem occurs intermittently. The pain is located in the right unilateral region. The quality of the pain is described as sharp. Pertinent negatives include no abdominal pain, blurred vision, dizziness, hearing loss, nausea, tingling or vomiting. Nothing aggravates the symptoms. Treatments tried: aleve. The treatment provided moderate relief.     Past Medical History:  Diagnosis Date  . Anemia   . Carpal tunnel syndrome   . Coronary artery disease    DES to ostial RCA 2005  . Diabetes mellitus without complication (HCC)    No longer taking meds  . Dyslipidemia   . Hypertension   . Osteoarthritis   . PAD (peripheral artery disease) (HCC)   . Sleep apnea      Family History  Problem Relation Age of Onset  . CAD Father 41  . Heart attack Father   . CAD Mother 50  . Heart attack Mother   . CAD Brother   . Cancer Brother        prostate  . CAD Sister   . Cancer Sister        breast  . CAD Brother      Current  Outpatient Medications:  .  Accu-Chek FastClix Lancets MISC, CHECK BLOOD SUGAR THREE TIMES DAILY dx: e11.65, Disp: 100 each, Rfl: 2 .  amLODipine (NORVASC) 10 MG tablet, TAKE 1 TABLET(10 MG) BY MOUTH DAILY, Disp: 90 tablet, Rfl: 1 .  aspirin 81 MG tablet, Take 81 mg by mouth daily., Disp: , Rfl:  .  beta carotene 36644 UNIT capsule, Take 25,000 Units by mouth daily. , Disp: , Rfl:  .  CHROMIUM PICOLINATE PO, Take 200 mcg by mouth daily. , Disp: , Rfl:  .  Coenzyme Q10 (COQ-10) 100 MG CAPS, Take 1 tablet by mouth daily. , Disp: , Rfl:  .  Continuous Blood Gluc Receiver (FREESTYLE LIBRE 2 READER) DEVI, Use as directed to check blood sugars 3 times per day dx: e11.65, Disp: 1 each, Rfl: 1 .  Continuous Blood Gluc Sensor (FREESTYLE LIBRE 2 SENSOR) MISC, Use as directed to check blood sugars 3 times per day dx: e11.65, Disp: 6 each, Rfl: 1 .  Cyanocobalamin (VITAMIN B-12 PO), Take 500 mcg by mouth. , Disp: , Rfl:  .  Flaxseed, Linseed, (FLAXSEED OIL) 1000 MG CAPS, Take 1 capsule by mouth daily., Disp: , Rfl:  .  glucosamine-chondroitin 500-400 MG tablet, Take 1 tablet by mouth daily. Reported on 12/17/2015, Disp: ,  Rfl:  .  glucose blood (ONETOUCH VERIO) test strip, CHECK BLOOD SUGAR THREE TIMES DAILY dx: e11.65, Disp: 150 each, Rfl: 11 .  Ibuprofen-Acetaminophen (ADVIL DUAL ACTION PO), Take by mouth. As needed, Disp: , Rfl:  .  Insulin Aspart, w/Niacinamide, (FIASP FLEXTOUCH [place]), Inject into the skin. Use sliding scale as directed three times daily. Inject 2 units for BG 150-199, 4 units for BG 200-249, 6 units for BG 250-299, 8 units for BG 300-340, 10 units for BG>350, BG>400 call MD, Disp: , Rfl:  .  insulin degludec (TRESIBA FLEXTOUCH) 200 UNIT/ML FlexTouch Pen, Inject 26 Units into the skin daily. Max dose is 80 units, Disp: 15 pen, Rfl: 1 .  Insulin Pen Needle (BD PEN NEEDLE NANO U/F) 32G X 4 MM MISC, USE TO INJECT INSULIN DAILY, Disp: 100 each, Rfl: 3 .  MAGNESIUM OXIDE PO, Take 500 mg by mouth  daily. , Disp: , Rfl:  .  metoprolol tartrate (LOPRESSOR) 50 MG tablet, TAKE 1 TABLET BY MOUTH TWICE DAILY, Disp: 180 tablet, Rfl: 4 .  Misc Natural Products (NEURIVA) CAPS, Take 1 capsule by mouth daily., Disp: , Rfl:  .  nystatin (NYSTATIN) powder, Apply topically 3 (three) times daily. As needed, Disp: 60 g, Rfl: 0 .  omeprazole (PRILOSEC) 20 MG capsule, TAKE 1 CAPSULE(20 MG) BY MOUTH DAILY, Disp: 90 capsule, Rfl: 1 .  ONETOUCH DELICA LANCETS 33G MISC, USE AS DIRECTED TO CHECK BLOOD SUGAR THREE TIMES DAILY, Disp: 100 each, Rfl: 0 .  pregabalin (LYRICA) 50 MG capsule, TAKE 1 CAPSULE(50 MG) BY MOUTH TWICE DAILY, Disp: 180 capsule, Rfl: 1 .  Psyllium (METAMUCIL FIBER PO), Take by mouth., Disp: , Rfl:  .  rosuvastatin (CRESTOR) 20 MG tablet, Take 1 tablet (20 mg total) by mouth daily. Please schedule appointment with Dr. Antoine Poche for refills., Disp: 90 tablet, Rfl: 0 .  traZODone (DESYREL) 50 MG tablet, TAKE 1 TABLET BY MOUTH EVERY NIGHT AT BEDTIME, Disp: 90 tablet, Rfl: 1 .  TRULICITY 1.5 MG/0.5ML SOPN, ADMINISTER 1.5 MG UNDER THE SKIN 1 TIME A WEEK, Disp: 6 mL, Rfl: 3 .  Vitamin A 3 MG (10000 UT) TABS, Take 1 tablet by mouth daily., Disp: , Rfl:  .  vitamin C (ASCORBIC ACID) 500 MG tablet, Take 1,000 mg by mouth 2 (two) times daily. , Disp: , Rfl:  .  Vitamin D, Ergocalciferol, (DRISDOL) 1.25 MG (50000 UNIT) CAPS capsule, TAKE 1 CAPSULE BY MOUTH EVERY 7 DAYS, Disp: 12 capsule, Rfl: 1 .  vitamin E 600 UNIT capsule, Take 400 Units by mouth daily. , Disp: , Rfl:  .  Cholecalciferol ([employer]S VIT D 5000 HIGH-POTENCY PO), Take 1 tablet by mouth daily.  (Patient not taking: Reported on 02/02/2020), Disp: , Rfl:  .  Krill Oil 350 MG CAPS, Take 350 mg by mouth daily.  (Patient not taking: Reported on 02/02/2020), Disp: , Rfl:    Allergies  Allergen Reactions  . Fish Allergy     Cod  . Latex Rash     Review of Systems  Constitutional: Negative.   HENT: Negative for hearing loss.   Eyes: Negative for  blurred vision.  Respiratory: Negative.   Cardiovascular: Negative.   Gastrointestinal: Negative.  Negative for abdominal pain, nausea and vomiting.  Neurological: Positive for headaches (started a week ago.). Negative for dizziness and tingling.  Psychiatric/Behavioral: Negative.   All other systems reviewed and are negative.    Today's Vitals   02/02/20 0938  BP: 138/90  Pulse: 72  Temp: 98 F (36.7 C)  TempSrc: Oral  Weight: 192 lb (87.1 kg)  Height: 5\' 6"  (1.676 m)  PainSc: 0-No pain   Body mass index is 30.99 kg/m.   Objective:  Physical Exam Vitals and nursing note reviewed.  Constitutional:      General: She is not in acute distress.    Appearance: Normal appearance. She is obese.  HENT:     Head: Normocephalic and atraumatic.  Eyes:     Extraocular Movements: Extraocular movements intact.     Right eye: Normal extraocular motion.     Left eye: Normal extraocular motion.     Pupils: Pupils are equal, round, and reactive to light.  Cardiovascular:     Rate and Rhythm: Normal rate and regular rhythm.     Heart sounds: Normal heart sounds. No murmur heard.   Pulmonary:     Effort: Pulmonary effort is normal. No respiratory distress.     Breath sounds: Normal breath sounds.  Skin:    General: Skin is warm and dry.  Neurological:     General: No focal deficit present.     Mental Status: She is alert and oriented to person, place, and time.  Psychiatric:        Mood and Affect: Mood normal. Mood is not anxious.        Speech: Speech normal.        Behavior: Behavior normal.         Assessment And Plan:     1. Acute nonintractable headache, unspecified headache type  This is a new headache over the age of 59, negative for neurological symptoms. Will send for CT of head without contrast to evaluate for any structural damage.   She is encouraged to take Tylenol as needed vs aleve to avoid rebound headaches. - CT Head Wo Contrast; Future     Patient was  given opportunity to ask questions. Patient verbalized understanding of the plan and was able to repeat key elements of the plan. All questions were answered to their satisfaction.  44, FNP   I, Arnette Felts, FNP, have reviewed all documentation for this visit. The documentation on 02/07/20 for the exam, diagnosis, procedures, and orders are all accurate and complete.  THE PATIENT IS ENCOURAGED TO PRACTICE SOCIAL DISTANCING DUE TO THE COVID-19 PANDEMIC.

## 2020-02-02 NOTE — Chronic Care Management (AMB) (Signed)
Chronic Care Management Pharmacy Assistant   Name: [name] [name]  MRN: [id number] DOB: [birth year]  Reason for Encounter: Medication Review/ Patient Assistance for Diabetic Supplies   PCP : [name], MD  Allergies:   Allergies  Allergen Reactions  . Fish Allergy     Cod  . Latex Rash    Medications: Outpatient Encounter Medications as of 02/01/2020  Medication Sig  . Accu-Chek FastClix Lancets MISC CHECK BLOOD SUGAR THREE TIMES DAILY dx: e11.65  . amLODipine (NORVASC) 10 MG tablet TAKE 1 TABLET(10 MG) BY MOUTH DAILY  . aspirin 81 MG tablet Take 81 mg by mouth daily.  . beta carotene 05397 UNIT capsule Take 25,000 Units by mouth daily.   . Cholecalciferol (CVS VIT D 5000 HIGH-POTENCY PO) Take 1 tablet by mouth daily.  (Patient not taking: Reported on 02/02/2020)  . CHROMIUM PICOLINATE PO Take 200 mcg by mouth daily.   . Coenzyme Q10 (COQ-10) 100 MG CAPS Take 1 tablet by mouth daily.   . Continuous Blood Gluc Receiver (FREESTYLE LIBRE 2 READER) DEVI Use as directed to check blood sugars 3 times per day dx: e11.65  . Continuous Blood Gluc Sensor (FREESTYLE LIBRE 2 SENSOR) MISC Use as directed to check blood sugars 3 times per day dx: e11.65  . Cyanocobalamin (VITAMIN B-12 PO) Take 500 mcg by mouth.   . Flaxseed, Linseed, (FLAXSEED OIL) 1000 MG CAPS Take 1 capsule by mouth daily.  Marland Kitchen glucosamine-chondroitin 500-400 MG tablet Take 1 tablet by mouth daily. Reported on 12/17/2015  . glucose blood (ONETOUCH VERIO) test strip CHECK BLOOD SUGAR THREE TIMES DAILY dx: e11.65  . Insulin Aspart, w/Niacinamide, (FIASP FLEXTOUCH [place]) Inject into the skin. Use sliding scale as directed three times daily. Inject 2 units for BG 150-199, 4 units for BG 200-249, 6 units for BG 250-299, 8 units for BG 300-340, 10 units for BG>350, BG>400 call MD  . insulin degludec (TRESIBA FLEXTOUCH) 200 UNIT/ML FlexTouch Pen Inject 26 Units into the skin daily. Max dose is 80 units  . Insulin Pen Needle (BD PEN  NEEDLE NANO U/F) 32G X 4 MM MISC USE TO INJECT INSULIN DAILY  . Krill Oil 350 MG CAPS Take 350 mg by mouth daily.  (Patient not taking: Reported on 02/02/2020)  . MAGNESIUM OXIDE PO Take 500 mg by mouth daily.   . metoprolol tartrate (LOPRESSOR) 50 MG tablet TAKE 1 TABLET BY MOUTH TWICE DAILY  . Misc Natural Products (NEURIVA) CAPS Take 1 capsule by mouth daily.  Marland Kitchen nystatin (NYSTATIN) powder Apply topically 3 (three) times daily. As needed  . omeprazole (PRILOSEC) 20 MG capsule TAKE 1 CAPSULE(20 MG) BY MOUTH DAILY  . ONETOUCH DELICA LANCETS 33G MISC USE AS DIRECTED TO CHECK BLOOD SUGAR THREE TIMES DAILY  . pregabalin (LYRICA) 50 MG capsule TAKE 1 CAPSULE(50 MG) BY MOUTH TWICE DAILY  . Psyllium (METAMUCIL FIBER PO) Take by mouth.  . rosuvastatin (CRESTOR) 20 MG tablet Take 1 tablet (20 mg total) by mouth daily. Please schedule appointment with Dr. Antoine Poche for refills.  . traZODone (DESYREL) 50 MG tablet TAKE 1 TABLET BY MOUTH EVERY NIGHT AT BEDTIME  . TRULICITY 1.5 MG/0.5ML SOPN ADMINISTER 1.5 MG UNDER THE SKIN 1 TIME A WEEK  . Vitamin A 3 MG (10000 UT) TABS Take 1 tablet by mouth daily.  . vitamin C (ASCORBIC ACID) 500 MG tablet Take 1,000 mg by mouth 2 (two) times daily.   . Vitamin D, Ergocalciferol, (DRISDOL) 1.25 MG (50000 UNIT) CAPS capsule TAKE  1 CAPSULE BY MOUTH EVERY 7 DAYS  . vitamin E 600 UNIT capsule Take 400 Units by mouth daily.    No facility-administered encounter medications on file as of 02/01/2020.    Current Diagnosis: Patient Active Problem List   Diagnosis Date Noted  . OSA and COPD overlap syndrome (HCC) 03/14/2019  . Snoring 02/17/2019  . Peripheral arterial disease (HCC) 01/26/2019  . Hepatitis C carrier (HCC) 01/26/2019  . Hypertensive heart and renal disease 08/19/2018  . Class 1 obesity due to excess calories with serious comorbidity and body mass index (BMI) of 33.0 to 33.9 in adult 08/19/2018  . Paresthesia of left upper extremity 06/29/2018  .  Atherosclerosis of native coronary artery of native heart with angina pectoris (HCC) 04/09/2018  . Dyslipidemia 04/09/2018  . Primary osteoarthritis of both feet 11/05/2017  . Primary osteoarthritis of both hands 10/30/2017  . Primary osteoarthritis of both knees 10/30/2017  . Primary insomnia 10/30/2017  . Family history of lupus erythematosus 10/30/2017  . Dyspnea on exertion 12/10/2016  . Type 2 diabetes mellitus with stage 2 chronic kidney disease, with long-term current use of insulin (HCC) 09/21/2014  . Acute cystitis 09/21/2014  . Hyperglycemia 09/19/2014  . Bradycardia 09/19/2014  . Hyponatremia 09/19/2014  . Elevated alkaline phosphatase level 09/19/2014  . Acute kidney injury (HCC)   . Essential hypertension, benign 07/05/2008  . Coronary atherosclerosis 07/05/2008  . TOBACCO USER 07/03/2008    Follow-Up:  Patient Assistance Coordination-Faxing new application to [facility] for Diabetes supplies for [facility] sensors. Faxing over demographic, prescription, and notes to Fax# [phone]. Pharmacist Beryle Flock has made patient aware that assistance is in progress and we will notify her when supplies will be delivered.   Billee Cashing, CMA Clinical Pharmacist Assistant [phone]

## 2020-02-02 NOTE — Patient Instructions (Signed)
General Headache Without Cause A headache is pain or discomfort that is felt around the head or neck area. There are many causes and types of headaches. In some cases, the cause may not be found. Follow these instructions at home: Watch your condition for any changes. Let your doctor know about them. Take these steps to help with your condition: Managing pain      Take over-the-counter and prescription medicines only as told by your doctor.  Lie down in a dark, quiet room when you have a headache.  If told, put ice on your head and neck area: ? Put ice in a plastic bag. ? Place a towel between your skin and the bag. ? Leave the ice on for 20 minutes, 2-3 times per day.  If told, put heat on the affected area. Use the heat source that your doctor recommends, such as a moist heat pack or a heating pad. ? Place a towel between your skin and the heat source. ? Leave the heat on for 20-30 minutes. ? Remove the heat if your skin turns bright red. This is very important if you are unable to feel pain, heat, or cold. You may have a greater risk of getting burned.  Keep lights dim if bright lights bother you or make your headaches worse. Eating and drinking  Eat meals on a regular schedule.  If you drink alcohol: ? Limit how much you use to:  0-1 drink a day for women.  0-2 drinks a day for men. ? Be aware of how much alcohol is in your drink. In the [place] one drink equals one 12 oz bottle of beer (355 mL), one 5 oz glass of wine (148 mL), or one 1 oz glass of hard liquor (44 mL).  Stop drinking caffeine, or reduce how much caffeine you drink. General instructions   Keep a journal to find out if certain things bring on headaches. For example, write down: ? What you eat and drink. ? How much sleep you get. ? Any change to your diet or medicines.  Get a massage or try other ways to relax.  Limit stress.  Sit up straight. Do not tighten (tense) your muscles.  Do not use any  products that contain nicotine or tobacco. This includes cigarettes, e-cigarettes, and chewing tobacco. If you need help quitting, ask your doctor.  Exercise regularly as told by your doctor.  Get enough sleep. This often means 7-9 hours of sleep each night.  Keep all follow-up visits as told by your doctor. This is important. Contact a doctor if:  Your symptoms are not helped by medicine.  You have a headache that feels different than the other headaches.  You feel sick to your stomach (nauseous) or you throw up (vomit).  You have a fever. Get help right away if:  Your headache gets very bad quickly.  Your headache gets worse after a lot of physical activity.  You keep throwing up.  You have a stiff neck.  You have trouble seeing.  You have trouble speaking.  You have pain in the eye or ear.  Your muscles are weak or you lose muscle control.  You lose your balance or have trouble walking.  You feel like you will pass out (faint) or you pass out.  You are mixed up (confused).  You have a seizure. Summary  A headache is pain or discomfort that is felt around the head or neck area.  There are many causes and   types of headaches. In some cases, the cause may not be found.  Keep a journal to help find out what causes your headaches. Watch your condition for any changes. Let your doctor know about them.  Contact a doctor if you have a headache that is different from usual, or if your headache is not helped by medicine.  Get help right away if your headache gets very bad, you throw up, you have trouble seeing, you lose your balance, or you have a seizure. This information is not intended to replace advice given to you by your health care provider. Make sure you discuss any questions you have with your health care provider. Document Revised: 12/28/2017 Document Reviewed: 12/28/2017 Elsevier Patient Education  2020 Elsevier Inc.  

## 2020-02-07 ENCOUNTER — Encounter: Payer: Self-pay | Admitting: Nurse Practitioner

## 2020-02-08 ENCOUNTER — Telehealth: Payer: Self-pay

## 2020-02-08 NOTE — Chronic Care Management (AMB) (Signed)
Chronic Care Management Pharmacy Assistant   Name: [name] [name]  MRN: [id number] DOB: April 13, 1951  Reason for Encounter: Medication Review/ Patient Assistance Follow up on DM supplies. PCP : [name], MD  Allergies:   Allergies  Allergen Reactions  . Fish Allergy     Cod  . Latex Rash    Medications: Outpatient Encounter Medications as of 02/08/2020  Medication Sig  . Accu-Chek FastClix Lancets MISC CHECK BLOOD SUGAR THREE TIMES DAILY dx: e11.65  . amLODipine (NORVASC) 10 MG tablet TAKE 1 TABLET(10 MG) BY MOUTH DAILY  . aspirin 81 MG tablet Take 81 mg by mouth daily.  . beta carotene 24580 UNIT capsule Take 25,000 Units by mouth daily.   . Cholecalciferol (CVS VIT D 5000 HIGH-POTENCY PO) Take 1 tablet by mouth daily.  (Patient not taking: Reported on 02/02/2020)  . CHROMIUM PICOLINATE PO Take 200 mcg by mouth daily.   . Coenzyme Q10 (COQ-10) 100 MG CAPS Take 1 tablet by mouth daily.   . Continuous Blood Gluc Receiver (FREESTYLE LIBRE 2 READER) DEVI Use as directed to check blood sugars 3 times per day dx: e11.65  . Continuous Blood Gluc Sensor (FREESTYLE LIBRE 2 SENSOR) MISC Use as directed to check blood sugars 3 times per day dx: e11.65  . Cyanocobalamin (VITAMIN B-12 PO) Take 500 mcg by mouth.   . Flaxseed, Linseed, (FLAXSEED OIL) 1000 MG CAPS Take 1 capsule by mouth daily.  Marland Kitchen glucosamine-chondroitin 500-400 MG tablet Take 1 tablet by mouth daily. Reported on 12/17/2015  . glucose blood (ONETOUCH VERIO) test strip CHECK BLOOD SUGAR THREE TIMES DAILY dx: e11.65  . Ibuprofen-Acetaminophen (ADVIL DUAL ACTION PO) Take by mouth. As needed  . Insulin Aspart, w/Niacinamide, (FIASP FLEXTOUCH [place]) Inject into the skin. Use sliding scale as directed three times daily. Inject 2 units for BG 150-199, 4 units for BG 200-249, 6 units for BG 250-299, 8 units for BG 300-340, 10 units for BG>350, BG>400 call MD  . insulin degludec (TRESIBA FLEXTOUCH) 200 UNIT/ML FlexTouch Pen Inject 26  Units into the skin daily. Max dose is 80 units  . Insulin Pen Needle (BD PEN NEEDLE NANO U/F) 32G X 4 MM MISC USE TO INJECT INSULIN DAILY  . Krill Oil 350 MG CAPS Take 350 mg by mouth daily.  (Patient not taking: Reported on 02/02/2020)  . MAGNESIUM OXIDE PO Take 500 mg by mouth daily.   . metoprolol tartrate (LOPRESSOR) 50 MG tablet TAKE 1 TABLET BY MOUTH TWICE DAILY  . Misc Natural Products (NEURIVA) CAPS Take 1 capsule by mouth daily.  Marland Kitchen nystatin (NYSTATIN) powder Apply topically 3 (three) times daily. As needed  . omeprazole (PRILOSEC) 20 MG capsule TAKE 1 CAPSULE(20 MG) BY MOUTH DAILY  . ONETOUCH DELICA LANCETS 33G MISC USE AS DIRECTED TO CHECK BLOOD SUGAR THREE TIMES DAILY  . pregabalin (LYRICA) 50 MG capsule TAKE 1 CAPSULE(50 MG) BY MOUTH TWICE DAILY  . Psyllium (METAMUCIL FIBER PO) Take by mouth.  . rosuvastatin (CRESTOR) 20 MG tablet Take 1 tablet (20 mg total) by mouth daily. Please schedule appointment with Dr. Antoine Poche for refills.  . traZODone (DESYREL) 50 MG tablet TAKE 1 TABLET BY MOUTH EVERY NIGHT AT BEDTIME  . TRULICITY 1.5 MG/0.5ML SOPN ADMINISTER 1.5 MG UNDER THE SKIN 1 TIME A WEEK  . Vitamin A 3 MG (10000 UT) TABS Take 1 tablet by mouth daily.  . vitamin C (ASCORBIC ACID) 500 MG tablet Take 1,000 mg by mouth 2 (two) times daily.   Marland Kitchen  Vitamin D, Ergocalciferol, (DRISDOL) 1.25 MG (50000 UNIT) CAPS capsule TAKE 1 CAPSULE BY MOUTH EVERY 7 DAYS  . vitamin E 600 UNIT capsule Take 400 Units by mouth daily.    No facility-administered encounter medications on file as of 02/08/2020.    Current Diagnosis: Patient Active Problem List   Diagnosis Date Noted  . OSA and COPD overlap syndrome (HCC) 03/14/2019  . Snoring 02/17/2019  . Peripheral arterial disease (HCC) 01/26/2019  . Hepatitis C carrier (HCC) 01/26/2019  . Hypertensive heart and renal disease 08/19/2018  . Class 1 obesity due to excess calories with serious comorbidity and body mass index (BMI) of 33.0 to 33.9 in adult  08/19/2018  . Paresthesia of left upper extremity 06/29/2018  . Atherosclerosis of native coronary artery of native heart with angina pectoris (HCC) 04/09/2018  . Dyslipidemia 04/09/2018  . Primary osteoarthritis of both feet 11/05/2017  . Primary osteoarthritis of both hands 10/30/2017  . Primary osteoarthritis of both knees 10/30/2017  . Primary insomnia 10/30/2017  . Family history of lupus erythematosus 10/30/2017  . Dyspnea on exertion 12/10/2016  . Type 2 diabetes mellitus with stage 2 chronic kidney disease, with long-term current use of insulin (HCC) 09/21/2014  . Acute cystitis 09/21/2014  . Hyperglycemia 09/19/2014  . Bradycardia 09/19/2014  . Hyponatremia 09/19/2014  . Elevated alkaline phosphatase level 09/19/2014  . Acute kidney injury (HCC)   . Essential hypertension, benign 07/05/2008  . Coronary atherosclerosis 07/05/2008  . TOBACCO USER 07/03/2008   Follow-Up:  Patient Assistance Coordination- Patient contact Pharmacist regarding DM medical supplies from [facility], she had not received yet and she has 2 days left on her sensor. Called [facility] and spoke w/Tracy, she pulled up patient account and it looks like patient is due for an reorder, she started with [facility] in 10/31/19 and they called patient on 01/21/20 with no response, patient just needs to call in for refill to be sent out on sensors. I advised that we sent in a new application but rep said that was not needed, she was already an existing patient.   Called patient informed that she will need to call [facility] at [phone] to have them send out refill. Patient states she did not know she had to do this because previous pharmacist handled it for her. Informed that we were not aware that she used this company in the past but I assured patient that we will keep on top of her refills and she would not have to worry about this delay again. Inquired if she still had a glucometer and supplies at home to check her blood sugars in  case they can not get her order to her in 2 days. Patient states she does and will do so in case sensor runs out.   Beryle Flock, CPP notified.  Billee Cashing, CMA Clinical Pharmacist Assistant [phone]

## 2020-02-14 ENCOUNTER — Other Ambulatory Visit: Payer: Self-pay

## 2020-02-14 MED ORDER — BD PEN NEEDLE NANO 2ND GEN 32G X 4 MM MISC: 2 refills | Status: DC

## 2020-02-16 ENCOUNTER — Ambulatory Visit
Admission: RE | Admit: 2020-02-16 | Discharge: 2020-02-16 | Disposition: A | Discharge status: Home / Self Care | Payer: Medicare Other | Source: Ambulatory Visit | Attending: Nurse Practitioner | Admitting: Nurse Practitioner

## 2020-02-16 ENCOUNTER — Other Ambulatory Visit: Payer: Self-pay | Admitting: Cardiology

## 2020-02-16 ENCOUNTER — Other Ambulatory Visit: Payer: Self-pay

## 2020-02-16 DIAGNOSIS — R519 Headache, unspecified: Secondary | ICD-10-CM

## 2020-02-16 DIAGNOSIS — G9389 Other specified disorders of brain: Secondary | ICD-10-CM | POA: Diagnosis not present

## 2020-02-16 DIAGNOSIS — R9082 White matter disease, unspecified: Secondary | ICD-10-CM | POA: Diagnosis not present

## 2020-02-16 DIAGNOSIS — I7090 Unspecified atherosclerosis: Secondary | ICD-10-CM | POA: Diagnosis not present

## 2020-02-16 MED ORDER — BD PEN NEEDLE NANO 2ND GEN 32G X 4 MM MISC: 2 refills | Status: DC

## 2020-02-16 NOTE — Telephone Encounter (Signed)
Rx has been sent to the pharmacy electronically. ° °

## 2020-02-20 DIAGNOSIS — Z794 Long term (current) use of insulin: Secondary | ICD-10-CM | POA: Diagnosis not present

## 2020-02-20 DIAGNOSIS — E119 Type 2 diabetes mellitus without complications: Secondary | ICD-10-CM | POA: Diagnosis not present

## 2020-02-21 NOTE — Patient Instructions (Addendum)
Visit Information  Goals Addressed            This Visit's Progress   . Pharmacy Care Plan       CARE PLAN ENTRY (see longitudinal plan of care for additional care plan information)  Current Barriers:  . Chronic Disease Management support, education, and care coordination needs related to Hypertension, Hyperlipidemia, and Diabetes   Hypertension BP Readings from Last 3 Encounters:  10/25/19 128/76  09/12/19 122/84  08/10/19 126/82   . Pharmacist Clinical Goal(s): o Over the next 180 days, patient will work with PharmD and providers to maintain BP goal <130/80 . Current regimen:  . Amlodipine 10mg  daily . Metoprolol tartrate 50mg  twice daily . Interventions: o Recommend patient check blood pressure a few times per month and if symptomatic . Patient self care activities - Over the next 180 days, patient will: o Check BP several times monthly and if symptomatic, document, and provide at future appointments o Ensure daily salt intake < 2300 mg/day  Hyperlipidemia Lab Results  Component Value Date/Time   LDLCALC 62 08/10/2019 10:57 AM   . Pharmacist Clinical Goal(s): o Over the next 180 days, patient will work with PharmD and providers to maintain LDL goal < 70 . Current regimen:  o Rosuvastatin 20mg  daily in the evening o Coenzyme Q10 100mg  daily . Interventions: o Discussed appropriate goals for triglycerides (less than 150) o Discussed the importance of medication adherence . Patient self care activities - Over the next 180 days, patient will: o Take medication every day as directed  Diabetes Lab Results  Component Value Date/Time   HGBA1C 7.9 (H) 10/25/2019 10:37 AM   HGBA1C 8.1 (H) 08/10/2019 10:57 AM   . Pharmacist Clinical Goal(s): o Over the next 90 days, patient will work with PharmD and providers to achieve A1c goal <7% . Current regimen:  . Trulicity  1.5mg  once weekly . Tresiba 26 units daily . Fiasp per sliding scale three times  daily . Interventions: o Discussed appropriate administration of Fiasp (at the start or within 20 minutes of starting meal) o Assisting patient with obtaining FreeStyle Libre sensors through . Patient self care activities - Over the next 90 days, patient will: o Check blood sugar 3-4 times daily, document, and provide at future appointments o Contact provider with any episodes of hypoglycemia  Medication management . Pharmacist Clinical Goal(s): o Over the next 180 days, patient will work with PharmD and providers to maintain optimal medication adherence . Current pharmacy: [facility] . Interventions o Comprehensive medication review performed. o Continue current medication management strategy o Reviewed over the counter supplements and discussed what supplements could be stopped - Patient stopping beta-carotene, Vitamin D3, chromium picolinate - Patient will start Vitamin C 500mg  daily . Patient self care activities - Over the next 180 days, patient will: o Focus on medication adherence by continued use of pill box o Take medications as prescribed o Report any questions or concerns to PharmD and/or provider(s)  Initial goal documentation        [name] [name] was given information about Chronic Care Management services today including:  1. CCM service includes personalized support from designated clinical staff supervised by her physician, including individualized plan of care and coordination with other care providers 2. 24/7 contact phone numbers for assistance for urgent and routine care needs. 3. Standard insurance, coinsurance, copays and deductibles apply for chronic care management only during months in which we provide at least 20 minutes of these services. Most  insurances cover these services at 100%, however patients may be responsible for any copay, coinsurance and/or deductible if applicable. This service may help you avoid the need for more expensive  face-to-face services. 4. Only one practitioner may furnish and bill the service in a calendar month. 5. The patient may stop CCM services at any time (effective at the end of the month) by phone call to the office staff.  Patient agreed to services and verbal consent obtained.   The patient verbalized understanding of instructions provided today and agreed to receive a mailed copy of patient instruction and/or educational materials. Telephone follow up appointment with pharmacy team member scheduled for: 02/29/20 @ 3:30 PM  Beryle Flock, PharmD Clinical Pharmacist [facility] [phone]    Diabetes Mellitus and Nutrition, Adult When you have diabetes (diabetes mellitus), it is very important to have healthy eating habits because your blood sugar (glucose) levels are greatly affected by what you eat and drink. Eating healthy foods in the appropriate amounts, at about the same times every day, can help you:  Control your blood glucose.  Lower your risk of heart disease.  Improve your blood pressure.  Reach or maintain a healthy weight. Every person with diabetes is different, and each person has different needs for a meal plan. Your health care provider may recommend that you work with a diet and nutrition specialist (dietitian) to make a meal plan that is best for you. Your meal plan may vary depending on factors such as:  The calories you need.  The medicines you take.  Your weight.  Your blood glucose, blood pressure, and cholesterol levels.  Your activity level.  Other health conditions you have, such as heart or kidney disease. How do carbohydrates affect me? Carbohydrates, also called carbs, affect your blood glucose level more than any other type of food. Eating carbs naturally raises the amount of glucose in your blood. Carb counting is a method for keeping track of how many carbs you eat. Counting carbs is important to keep your blood glucose  at a healthy level, especially if you use insulin or take certain oral diabetes medicines. It is important to know how many carbs you can safely have in each meal. This is different for every person. Your dietitian can help you calculate how many carbs you should have at each meal and for each snack. Foods that contain carbs include:  Bread, cereal, rice, pasta, and crackers.  Potatoes and corn.  Peas, beans, and lentils.  Milk and yogurt.  Fruit and juice.  Desserts, such as cakes, cookies, ice cream, and candy. How does alcohol affect me? Alcohol can cause a sudden decrease in blood glucose (hypoglycemia), especially if you use insulin or take certain oral diabetes medicines. Hypoglycemia can be a life-threatening condition. Symptoms of hypoglycemia (sleepiness, dizziness, and confusion) are similar to symptoms of having too much alcohol. If your health care provider says that alcohol is safe for you, follow these guidelines:  Limit alcohol intake to no more than 1 drink per day for nonpregnant women and 2 drinks per day for men. One drink equals 12 oz of beer, 5 oz of wine, or 1 oz of hard liquor.  Do not drink on an empty stomach.  Keep yourself hydrated with water, diet soda, or unsweetened iced tea.  Keep in mind that regular soda, juice, and other mixers may contain a lot of sugar and must be counted as carbs. What are tips for following this plan?  Reading food labels  Start by checking the serving size on the "Nutrition Facts" label of packaged foods and drinks. The amount of calories, carbs, fats, and other nutrients listed on the label is based on one serving of the item. Many items contain more than one serving per package.  Check the total grams (g) of carbs in one serving. You can calculate the number of servings of carbs in one serving by dividing the total carbs by 15. For example, if a food has 30 g of total carbs, it would be equal to 2 servings of carbs.  Check  the number of grams (g) of saturated and trans fats in one serving. Choose foods that have low or no amount of these fats.  Check the number of milligrams (mg) of salt (sodium) in one serving. Most people should limit total sodium intake to less than 2,300 mg per day.  Always check the nutrition information of foods labeled as "low-fat" or "nonfat". These foods may be higher in added sugar or refined carbs and should be avoided.  Talk to your dietitian to identify your daily goals for nutrients listed on the label. Shopping  Avoid buying canned, premade, or processed foods. These foods tend to be high in fat, sodium, and added sugar.  Shop around the outside edge of the grocery store. This includes fresh fruits and vegetables, bulk grains, fresh meats, and fresh dairy. Cooking  Use low-heat cooking methods, such as baking, instead of high-heat cooking methods like deep frying.  Cook using healthy oils, such as olive, canola, or sunflower oil.  Avoid cooking with butter, cream, or high-fat meats. Meal planning  Eat meals and snacks regularly, preferably at the same times every day. Avoid going long periods of time without eating.  Eat foods high in fiber, such as fresh fruits, vegetables, beans, and whole grains. Talk to your dietitian about how many servings of carbs you can eat at each meal.  Eat 4-6 ounces (oz) of lean protein each day, such as lean meat, chicken, fish, eggs, or tofu. One oz of lean protein is equal to: ? 1 oz of meat, chicken, or fish. ? 1 egg. ?  cup of tofu.  Eat some foods each day that contain healthy fats, such as avocado, nuts, seeds, and fish. Lifestyle  Check your blood glucose regularly.  Exercise regularly as told by your health care provider. This may include: ? 150 minutes of moderate-intensity or vigorous-intensity exercise each week. This could be brisk walking, biking, or water aerobics. ? Stretching and doing strength exercises, such as yoga  or weightlifting, at least 2 times a week.  Take medicines as told by your health care provider.  Do not use any products that contain nicotine or tobacco, such as cigarettes and e-cigarettes. If you need help quitting, ask your health care provider.  Work with a Veterinary surgeon or diabetes educator to identify strategies to manage stress and any emotional and social challenges. Questions to ask a health care provider  Do I need to meet with a diabetes educator?  Do I need to meet with a dietitian?  What number can I call if I have questions?  When are the best times to check my blood glucose? Where to find more information:  American Diabetes Association: diabetes.org  Academy of Nutrition and Dietetics: www.eatright.[employer] of Diabetes and Digestive and Kidney Diseases (NIH): CarFli[employer].tn Summary  A healthy meal plan will help you control your blood glucose and maintain a healthy  lifestyle.  Working with a diet and nutrition specialist (dietitian) can help you make a meal plan that is best for you.  Keep in mind that carbohydrates (carbs) and alcohol have immediate effects on your blood glucose levels. It is important to count carbs and to use alcohol carefully. This information is not intended to replace advice given to you by your health care provider. Make sure you discuss any questions you have with your health care provider. Document Revised: 05/22/2017 Document Reviewed: 07/14/2016 Elsevier Patient Education  2020 [employer].

## 2020-02-22 ENCOUNTER — Other Ambulatory Visit: Payer: Self-pay | Admitting: Internal Medicine

## 2020-02-26 ENCOUNTER — Other Ambulatory Visit: Payer: Self-pay | Admitting: Internal Medicine

## 2020-02-29 ENCOUNTER — Telehealth: Payer: Self-pay

## 2020-02-29 NOTE — Chronic Care Management (AMB) (Deleted)
Chronic Care Management Pharmacy  Name: [name] [name]  MRN: [id number] DOB: May 22, 1951  Chief Complaint/ HPI  [name] [name],  69 y.o. , female presents for their Initial CCM visit with the clinical pharmacist via telephone due to COVID-19 Pandemic.  PCP : Glendale Chard, MD  Their chronic conditions include: Hypertension, Hyperlipidemia and Diabetes  Office Visits: 02/02/20 OV: Presented for evaluation of headaches. Pt requested CT of her head. Pt had had headache for about 5 days. Pain located right unilateral region. Pt has tried Aleve with moderate relief. Negative for neurological symptoms. Advised to take Tylenol instead of Aleve for headache. CT head ordered.   01/26/20 telephone call: Pt called reporting sharp pains in the top of her head since 4:30am. Advised to go to ER for evaluation.   01/23/20 Telephone call: Pt provided with sample of Freestyle Libre2 sensor and Fiasp. Pt awaiting delivery of Freestyle Libre from [place].   11/08/19 Pt came into the office for assistance using FreeStyle Libre meter. Encouraged to call with any further questions  10/25/19 OV: Hepatitis C antibody was positive with low viral load, no active infection. Liver and kidney function stable. HgbA1c has improved to 7.9%.  09/12/19 OV: Presented for evaluation of fatigue and body aches. Feels better today. Changed filter in CPAP and is sleeping better. Planning to discuss initiating duloxetine for arthralgia, but pt thinks it's not necessary at this time. Recommend antiinflammatory diet. CPAP compliance and care instructions discussed. Referred to Nutrition and Diabetic services. Started on sliding scale insulin. Check BG before each meal.   08/24/19 Telephone call: Notified pt that stool cards were positive for blood. Will refer to [facility] for follow up.   08/10/19 OV: Pt noted blood on toilet paper but unsure if from vagina or rectum, no blood in toilet. Complains of intermittent, stabbing headaches.  Headache possibly due to sinus congestion. Will let PCP know if symptoms persist. Kidney function normal. HgbA1c elevated to 8.1% and glucose high at 230 today. Cholesterol is good. Discussed importance of statin compliance and exercise. Heart heathy diet recommended. Advised to use Miralax daily for constipation. If ineffective will consider Linzess. Advised to increase water and fiber intake.   Consult Visits: 01/26/20 ED visit: Presented with headache. Left due to wait time  10/17/19 Nutrition OV: Pt doing better. Recommend dried beans, any vegetables, nuts. Drink plenty of water. Stay physically active.   09/16/19 Nutrition OV: Plan for pt to attend individual and/or group session. Work on Child psychotherapist. Exercise. Eat balanced meals and consider having protein every morning.  CCM Encounters: 09/14/19 RN and PharmD: Pt called stating Libre scanner is $137 at pharmacy. Collaboration with embedded PharmD. Elenor Legato Rx will need to be resent to [facility] and should include required clinical notes per PCP. Libre cannot be filled at [facility].   09/12/19 SW: Requested referral from PCP for diabetic eye exam.   09/08/19 RN: Pt called stating she does not feel well and BG has been elevated. Reviewed and discussed treatment plans for blood in stool, finding eye doctor, elevated BG, and anxiety.   09/06/19 PharmD: Plan to initiate Farxiga 29m at upcoming PCP appt on 09/12/19.   07/29/19 PharmD: Pt missed Trulicity dose and called to see when to take. Advised to administer today.   Medications: Outpatient Encounter Medications as of 02/29/2020  Medication Sig  . Accu-Chek FastClix Lancets MISC CHECK BLOOD SUGAR THREE TIMES DAILY dx: e11.65  . amLODipine (NORVASC) 10 MG tablet TAKE 1 TABLET(10 MG) BY MOUTH DAILY  .  aspirin 81 MG tablet Take 81 mg by mouth daily.  . beta carotene 25000 UNIT capsule Take 25,000 Units by mouth daily.   . Cholecalciferol (CVS VIT D 5000 HIGH-POTENCY PO) Take 1 tablet by mouth  daily.  (Patient not taking: Reported on 02/02/2020)  . CHROMIUM PICOLINATE PO Take 200 mcg by mouth daily.   . Coenzyme Q10 (COQ-10) 100 MG CAPS Take 1 tablet by mouth daily.   . Continuous Blood Gluc Receiver (FREESTYLE LIBRE 2 READER) DEVI Use as directed to check blood sugars 3 times per day dx: e11.65  . Continuous Blood Gluc Sensor (FREESTYLE LIBRE 2 SENSOR) MISC Use as directed to check blood sugars 3 times per day dx: e11.65  . Cyanocobalamin (VITAMIN B-12 PO) Take 500 mcg by mouth.   . Flaxseed, Linseed, (FLAXSEED OIL) 1000 MG CAPS Take 1 capsule by mouth daily.  Marland Kitchen glucosamine-chondroitin 500-400 MG tablet Take 1 tablet by mouth daily. Reported on 12/17/2015  . glucose blood (ONETOUCH VERIO) test strip CHECK BLOOD SUGAR THREE TIMES DAILY dx: e11.65  . Ibuprofen-Acetaminophen (ADVIL DUAL ACTION PO) Take by mouth. As needed  . Insulin Aspart, w/Niacinamide, (FIASP FLEXTOUCH [place]) Inject into the skin. Use sliding scale as directed three times daily. Inject 2 units for BG 150-199, 4 units for BG 200-249, 6 units for BG 250-299, 8 units for BG 300-340, 10 units for BG>350, BG>400 call MD  . insulin degludec (TRESIBA FLEXTOUCH) 200 UNIT/ML FlexTouch Pen Inject 26 Units into the skin daily. Max dose is 80 units  . Insulin Pen Needle (BD PEN NEEDLE NANO 2ND GEN) 32G X 4 MM MISC USE TO INJECT INSULIN  3 TIMES DAILY  . Krill Oil 350 MG CAPS Take 350 mg by mouth daily.  (Patient not taking: Reported on 02/02/2020)  . MAGNESIUM OXIDE PO Take 500 mg by mouth daily.   . metoprolol tartrate (LOPRESSOR) 50 MG tablet TAKE 1 TABLET BY MOUTH TWICE DAILY  . Misc Natural Products (NEURIVA) CAPS Take 1 capsule by mouth daily.  Marland Kitchen nystatin (NYSTATIN) powder Apply topically 3 (three) times daily. As needed  . omeprazole (PRILOSEC) 20 MG capsule TAKE ONE CAPSULE BY MOUTH DAILY.  Marland Kitchen ONETOUCH DELICA LANCETS 67R MISC USE AS DIRECTED TO CHECK BLOOD SUGAR THREE TIMES DAILY  . pregabalin (LYRICA) 50 MG capsule TAKE 1  CAPSULE(50 MG) BY MOUTH TWICE DAILY  . Psyllium (METAMUCIL FIBER PO) Take by mouth.  . rosuvastatin (CRESTOR) 20 MG tablet TAKE 1 TABLET(20 MG) BY MOUTH DAILY  . traZODone (DESYREL) 50 MG tablet TAKE 1 TABLET BY MOUTH EVERY NIGHT AT BEDTIME  . TRULICITY 1.5 FF/6.3WG SOPN ADMINISTER 1.5 MG UNDER THE SKIN 1 TIME A WEEK  . Vitamin A 3 MG (10000 UT) TABS Take 1 tablet by mouth daily.  . vitamin C (ASCORBIC ACID) 500 MG tablet Take 1,000 mg by mouth 2 (two) times daily.   . Vitamin D, Ergocalciferol, (DRISDOL) 1.25 MG (50000 UNIT) CAPS capsule TAKE 1 CAPSULE BY MOUTH EVERY 7 DAYS  . vitamin E 600 UNIT capsule Take 400 Units by mouth daily.    No facility-administered encounter medications on file as of 02/29/2020.    Current Diagnosis/Assessment:    Goals Addressed   None     Diabetes   A1c goal <7%  Recent Relevant Labs: Lab Results  Component Value Date/Time   HGBA1C 7.9 (H) 10/25/2019 10:37 AM   HGBA1C 8.1 (H) 08/10/2019 10:57 AM   MICROALBUR 30 03/23/2019 11:44 AM   MICROALBUR 30 08/19/2018  12:39 PM    Last diabetic Eye exam:  Lab Results  Component Value Date/Time   HMDIABEYEEXA No Retinopathy 11/14/2019 12:00 AM    Last diabetic Foot exam: 03/23/19  Checking BG: 3x per Day  Recent FBG Readings: None provided today Recent pre-meal BG readings:  Recent 2hr PP BG readings:   Recent HS BG readings:   Patient has failed these meds in past: Glimepiride, Novolog, Lantus, Januvia, Janumet, Metformin Patient is currently uncontrolled on the following medications: . Trulicity  2.9BM once weekly . Tresiba 26 units daily . Fiasp per sliding scale three times daily  We discussed:   Freestyle Libre from [place] . Diet extensively o *** . Exercise extensively o ***   . Pt administers Fiasp per sliding scale when BG goes over 150/160 . Checks BG before breakfast, lunch, and dinner and 1 hour after breakfast, 2-3 hours after lunch, and after dinner.  . Discussed  appropriate time check BG and administer Rosalia Hammers should be administered at the start of or within 20 minutes of starting meal. Pt states that is what she does . Assisted pt with coordination of FreeStyle [place] delivery from [place] on Friday 7/30 (sensors should be delivered in 5-7 business days) . Pt takes Trulicity on Thursdays, and Tresiba nightly at [place] current medications  Discuss diet and exercise at follow up  Hyperlipidemia   LDL goal < 70  Lipid Panel     Component Value Date/Time   CHOL 134 08/10/2019 1057   TRIG 169 (H) 08/10/2019 1057   HDL 44 08/10/2019 1057   LDLCALC 62 08/10/2019 1057    Hepatic Function Latest Ref Rng & Units 10/25/2019 01/26/2019 08/19/2018  Total Protein 6.0 - 8.5 g/dL 7.2 7.6 7.3  Albumin 3.8 - 4.8 g/dL 4.5 4.3 4.2  AST 0 - 40 IU/L 21 13 15   ALT 0 - 32 IU/L 14 11 11   Alk Phosphatase 39 - 117 IU/L 179(H) 134(H) 190(H)  Total Bilirubin 0.0 - 1.2 mg/dL 0.3 0.3 0.3    The 10-year ASCVD risk score Mikey Bussing DC Jr., et al., 2013) is: 20.6%   Values used to calculate the score:     Age: 18 years     Sex: Female     Is Non-Hispanic African American: Yes     Diabetic: Yes     Tobacco smoker: No     Systolic Blood Pressure: 841 mmHg     Is BP treated: Yes     HDL Cholesterol: 44 mg/dL     Total Cholesterol: 134 mg/dL   Patient has failed these meds in past: N/A Patient is currently controlled on the following medications:  . Rosuvastatin 68m daily in the evening . Coenzyme Q10 1019mdaily  We discussed:   . Pt endorses 99% medication adherence . Triglycerides above goal <150  Plan Continue current medications  Discuss diet and exercise at follow up  Hypertension   BP goal is:  <130/80  Office blood pressures are  BP Readings from Last 3 Encounters:  02/02/20 138/90  10/25/19 128/76  09/12/19 122/84   Patient checks BP at home infrequently Patient home BP readings are ranging: Pt says normally  good  Patient has failed these meds in the past: olmesartan, olmesartan/HCTZ, chlorthalidone, HCTZ,  Patient is currently controlled on the following medications:  . Amlodipine 1065maily . Metoprolol tartrate 63m9mice daily  We discussed: . Pt does not check BP often, but usually good . Recommend pt  check BP periodically (a few times monthly) and if symptomatic  Plan Continue current medications  Discuss diet and exercise at follow up  Neuropathy   Patient has failed these meds in past: Gabapentin Patient is currently controlled on the following medications:   Pregabalin 69m twice daily  We discussed:    Pt takes Lyrica for tingling under her skin  Plan Continue current medications  Osteopenia / Osteoporosis   Last DEXA Scan: 2011- unable to find results  T-Score femoral neck:   T-Score total hip:  T-Score lumbar spine:   T-Score forearm radius:   10-year probability of major osteoporotic fracture:   10-year probability of hip fracture:   Vit D, 25-Hydroxy  Date Value Ref Range Status  03/23/2019 32.5 30.0 - 100.0 ng/mL Final    Comment:    Vitamin D deficiency has been defined by the [place] Endocrine Society practice guideline as a level of serum 25-OH vitamin D less than 20 ng/mL (1,2). The Endocrine Society went on to further define vitamin D insufficiency as a level between 21 and 29 ng/mL (2). 1. IOM (Institute of Medicine). 2010. Dietary reference    intakes for calcium and D. [place]    [employer] 2. Holick MF, Binkley Bartlett, Bischoff-Ferrari HA, et al.    Evaluation, treatment, and prevention of vitamin D    deficiency: an Endocrine Society clinical practice    guideline. JCEM. 2011 Jul; 96(7):1911-30.    Patient has failed these meds in past: N/A Patient is currently controlled on the following medications:  . Ergocalciferol 50,000 units once weekly  We discussed:  Recommend 1200 mg of calcium daily  from dietary and supplemental sources.   Advised pt that she should take calcium citrate which will have the best absorption since patient is on omeprazole  Discussed that patient does not need to take Vitamin D3 since she is already on Vitamin D2  Pt takes ergocalciferol on Saturdays  Plan Continue current medications  Recommend DEXA scan  GERD   Patient has failed these meds in past: N/A Patient is currently controlled on the following medications:   Omeprazole 260mdaily  We discussed:    Pt states that this helps tremendously with her reflux symptoms  Plan Continue current medications  Insomnia   Patient has failed these meds in past: Temazepam, Lunesta Patient is currently controlled on the following medications:   Trazodone 5022mt bedtime  We discussed:   Pt reports that she takes trazodone 4-5 nights per week  She says that if she takes it every night it makes her too drowsy  Using CPAP helps with her sleep  Plan Continue current medications  Health Maintenance   Patient is currently on the following medications:  . GMarland Kitchenucosamine-Chondroitin 500-400m6mily . Vitamin E 400 units daily . Magnesium oxide 500mg29mly . Nystatin powder three times daily as needed . Metamucil daily . Vitamin A 10000 units daily . Neuriva 1 capsule daily . Vitamin B12 500mcg56mly . Flaxseed oil 1000mg d29m . Aspirin 81mg da11m We discussed:    Reviewed all supplements . Stopped taking Vitamin C because it can cause a false reading with FreeStyle Libre o Pt will start taking Vitamin C 500mg . P52mked which supplements she does not need to take . Pt has run out of beta-carotene. Discussed that this is a precursor to Vitamin A, which pt is already taking. . Pt has run out of krill oil, is  going to continue just taking flaxseed oil . Pt has run out of chromium picolinate. Advised pt that this supplement is not necessary. It may help with blood sugar, but it is not  going to replace her diabetic medications at this point.   Plan Continue current medications as listed  Recommend Vitamin B12 level  Vaccines   Reviewed and discussed patient's vaccination history.    Immunization History  Administered Date(s) Administered  . Fluad Quad(high Dose 65+) 03/04/2019  . Influenza, High Dose Seasonal PF 03/21/2017, 03/11/2018  . PFIZER SARS-COV-2 Vaccination 07/30/2019, 08/20/2019  . Pneumococcal Conjugate-13 10/23/2016  . Zoster Recombinat (Shingrix) 01/25/2019, 03/30/2019   Plan Discuss at follow up visit  Medication Management   Pt uses [place] for all medications Uses pill box? Yes Pt endorses 99% compliance  We discussed:  . Importance of taking each medications daily as directed . Pt does not miss doses often . Discussed indications for all medications  Plan Continue current medication management strategy  Discuss medication synchronization and adherence packaging at follow up  Follow up: 1 month phone visit  Jannette Fogo, PharmD Clinical Pharmacist [facility] [phone]

## 2020-03-02 DIAGNOSIS — Z794 Long term (current) use of insulin: Secondary | ICD-10-CM | POA: Diagnosis not present

## 2020-03-02 DIAGNOSIS — E1165 Type 2 diabetes mellitus with hyperglycemia: Secondary | ICD-10-CM | POA: Diagnosis not present

## 2020-03-03 DIAGNOSIS — G4733 Obstructive sleep apnea (adult) (pediatric): Secondary | ICD-10-CM | POA: Diagnosis not present

## 2020-03-17 ENCOUNTER — Other Ambulatory Visit: Payer: Self-pay | Admitting: Internal Medicine

## 2020-03-21 DIAGNOSIS — E119 Type 2 diabetes mellitus without complications: Secondary | ICD-10-CM | POA: Diagnosis not present

## 2020-03-22 DIAGNOSIS — E119 Type 2 diabetes mellitus without complications: Secondary | ICD-10-CM | POA: Diagnosis not present

## 2020-03-22 DIAGNOSIS — Z794 Long term (current) use of insulin: Secondary | ICD-10-CM | POA: Diagnosis not present

## 2020-03-25 DIAGNOSIS — Z7189 Other specified counseling: Secondary | ICD-10-CM | POA: Insufficient documentation

## 2020-03-25 NOTE — Progress Notes (Signed)
Cardiology Office Note   Date:  03/26/2020   ID:  [name] [name], DOB August 07, 1950, MRN [id number]  PCP:  [name], MD  Cardiologist:   Rollene Rotunda, MD   Chief Complaint  Patient presents with  . Shortness of Breath      History of Present Illness: [name] [name] is a 69 y.o. female who presents follow up of CAD.   She had a history of a PCI in 2005.  She had a stress perfusion study in 2013 demonstrated no evidence of ischemia and well-preserved ejection fraction.  In 2018 she had a negative inadequate POET (Plain Old Exercise Treadmill).    Late last year she was having some increased dyspnea.  She had an echo ordered by her PCP.  I reviewed personally these images for this appt. She had normal LV function and no significant valvular abnormalities.     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  Is not had any new chest pressure, neck or arm discomfort.  She has some occasional sharp pain under her left breast.  She has some sharp pain with movement in her right shoulder.  She is not having any substernal chest discomfort.  She did have the shortness of breath as mentioned but she said this has resolved.  She is not noticing any palpitations, presyncope or syncope.  She has no weight gain or edema.    Past Medical History:  Diagnosis Date  . Anemia   . Carpal tunnel syndrome   . Coronary artery disease    DES to ostial RCA 2005  . Diabetes mellitus without complication (HCC)    No longer taking meds  . Dyslipidemia   . Hypertension   . Osteoarthritis   . PAD (peripheral artery disease) (HCC)   . Sleep apnea     Past Surgical History:  Procedure Laterality Date  . APPENDECTOMY    . BREAST EXCISIONAL BIOPSY Right >10+ yrs ago   benign  . heart stent    . TONSILLECTOMY    . WRIST SURGERY       Current Outpatient Medications  Medication Sig Dispense Refill  .  Accu-Chek FastClix Lancets MISC CHECK BLOOD SUGAR THREE TIMES DAILY dx: e11.65 100 each 2  . amLODipine (NORVASC) 10 MG tablet TAKE 1 TABLET(10 MG) BY MOUTH DAILY 90 tablet 1  . aspirin 81 MG tablet Take 81 mg by mouth daily.    . beta carotene 76283 UNIT capsule Take 25,000 Units by mouth daily.     . Cholecalciferol (CVS VIT D 5000 HIGH-POTENCY PO) Take 1 tablet by mouth daily.     . CHROMIUM PICOLINATE PO Take 200 mcg by mouth daily.     . Coenzyme Q10 (COQ-10) 100 MG CAPS Take 1 tablet by mouth daily.     . Continuous Blood Gluc Receiver (FREESTYLE LIBRE 2 READER) DEVI Use as directed to check blood sugars 3 times per day dx: e11.65 1 each 1  . Continuous Blood Gluc Sensor (FREESTYLE LIBRE 2 SENSOR) MISC Use as directed to check blood sugars 3 times per day dx: e11.65 6 each 1  . Cyanocobalamin (VITAMIN B-12 PO) Take 500 mcg by mouth.     . Flaxseed, Linseed, (FLAXSEED OIL) 1000 MG CAPS Take 1 capsule by mouth daily.    Marland Kitchen glucosamine-chondroitin 500-400 MG tablet Take 1 tablet by mouth daily. Reported on  12/17/2015    . glucose blood (ONETOUCH VERIO) test strip CHECK BLOOD SUGAR THREE TIMES DAILY dx: e11.65 150 each 11  . Ibuprofen-Acetaminophen (ADVIL DUAL ACTION PO) Take by mouth. As needed    . Insulin Aspart, w/Niacinamide, (FIASP FLEXTOUCH [place]) Inject into the skin. Use sliding scale as directed three times daily. Inject 2 units for BG 150-199, 4 units for BG 200-249, 6 units for BG 250-299, 8 units for BG 300-340, 10 units for BG>350, BG>400 call MD    . insulin degludec (TRESIBA FLEXTOUCH) 200 UNIT/ML FlexTouch Pen Inject 26 Units into the skin daily. Max dose is 80 units 15 pen 1  . Insulin Pen Needle (BD PEN NEEDLE NANO 2ND GEN) 32G X 4 MM MISC USE TO INJECT INSULIN  3 TIMES DAILY 100 each 2  . MAGNESIUM OXIDE PO Take 500 mg by mouth daily.     . metoprolol tartrate (LOPRESSOR) 50 MG tablet TAKE 1 TABLET BY MOUTH TWICE DAILY 180 tablet 4  . Misc Natural Products (NEURIVA) CAPS Take 1  capsule by mouth daily.    Marland Kitchen nystatin (NYSTATIN) powder Apply topically 3 (three) times daily. As needed 60 g 0  . omeprazole (PRILOSEC) 20 MG capsule TAKE ONE CAPSULE BY MOUTH DAILY. 90 capsule 1  . ONETOUCH DELICA LANCETS 33G MISC USE AS DIRECTED TO CHECK BLOOD SUGAR THREE TIMES DAILY 100 each 0  . pregabalin (LYRICA) 50 MG capsule TAKE 1 CAPSULE(50 MG) BY MOUTH TWICE DAILY 180 capsule 1  . Psyllium (METAMUCIL FIBER PO) Take by mouth.    . rosuvastatin (CRESTOR) 20 MG tablet TAKE 1 TABLET(20 MG) BY MOUTH DAILY 90 tablet 1  . traZODone (DESYREL) 50 MG tablet TAKE 1 TABLET BY MOUTH EVERY NIGHT AT BEDTIME 90 tablet 1  . TRULICITY 1.5 MG/0.5ML SOPN ADMINISTER 1.5 MG UNDER THE SKIN 1 TIME A WEEK 6 mL 3  . Vitamin A 3 MG (10000 UT) TABS Take 1 tablet by mouth daily.    . vitamin C (ASCORBIC ACID) 500 MG tablet Take 1,000 mg by mouth 2 (two) times daily.     . Vitamin D, Ergocalciferol, (DRISDOL) 1.25 MG (50000 UNIT) CAPS capsule TAKE 1 CAPSULE BY MOUTH EVERY 7 DAYS 12 capsule 1  . vitamin E 600 UNIT capsule Take 400 Units by mouth daily.      No current facility-administered medications for this visit.    Allergies:   Fish allergy and Latex    ROS:  Please see the history of present illness.   Otherwise, review of systems are positive for none.   All other systems are reviewed and occasional right shooting groin pain.    PHYSICAL EXAM: VS:  BP (!) 144/89   Ht 5\' 6"  (1.676 m)   Wt 193 lb 6.4 oz (87.7 kg)   SpO2 98%   BMI 31.22 kg/m  , BMI Body mass index is 31.22 kg/m. GENERAL:  Well appearing NECK:  No jugular venous distention, waveform within normal limits, carotid upstroke brisk and symmetric, no bruits, no thyromegaly LUNGS:  Clear to auscultation bilaterally CHEST:  Unremarkable HEART:  PMI not displaced or sustained,S1 and S2 within normal limits, no S3, no S4, no clicks, no rubs, no murmurs ABD:  Flat, positive bowel sounds normal in frequency in pitch, no bruits, no rebound,  no guarding, no midline pulsatile mass, no hepatomegaly, no splenomegaly EXT:  2 plus pulses upper, mildly diminished dorsalis pedis and posterior best bilaterally, no edema, no cyanosis no clubbing   EKG:  EKG is  ordered today. The ekg ordered today demonstrates sinus rhythm, rate 81 premature ectopic complexes, low voltage on the limb leads, poor anterior R wave progression, nonspecific T wave flattening.   Recent Labs: 05/05/2019: Hemoglobin 12.3; Platelets 288 10/25/2019: ALT 14; BUN 7; Creatinine, Ser 0.84; Potassium 4.4; Sodium 142    Lipid Panel    Component Value Date/Time   CHOL 134 08/10/2019 1057   TRIG 169 (H) 08/10/2019 1057   HDL 44 08/10/2019 1057   CHOLHDL 3.0 08/10/2019 1057   CHOLHDL 3.0 Ratio 09/16/2010 2137   VLDL 39 09/16/2010 2137   LDLCALC 62 08/10/2019 1057      Wt Readings from Last 3 Encounters:  03/26/20 193 lb 6.4 oz (87.7 kg)  02/02/20 192 lb (87.1 kg)  10/25/19 191 lb (86.6 kg)      Other studies Reviewed: Additional studies/ records that were reviewed today include:  Labs, I personally reviewed the echo images ordered by her primary provider Review of the above records demonstrates:  Please see elsewhere in the note.     ASSESSMENT AND PLAN:  CAD:   The patient has some atypical discomfort.  She had an inadequate but negative stress test in 2018.  I do not think that further imaging is indicated since she is not having any symptoms consistent with unstable angina and the dyspnea that she was describing has improved.   DYSLIPIDEMIA:   LDL was 62.  No change in therapy.   DM:  Her A1c was 7.9 but is being followed closely by Dr. Allyne Gee, Melina Schools, MD this is going to be checked soon  HTN: Her blood pressure is slightly elevated today but this is quite unusual.  She will keep a blood pressure diary.  No change in therapy.   SLEEP APNEA: She does have CPAP but she does not wear it.  We talked about this today.  DYSPNEA: This has resolved.  No  change in therapy.  COVID EDUCATION: She has had her vaccine.  She had Pfizer and I suggested she get the booster.  Current medicines are reviewed at length with the patient today.  The patient does not have concerns regarding medicines.  The following changes have been made:  no change  Labs/ tests ordered today include:  Orders Placed This Encounter  Procedures  . EKG 12-Lead     Disposition:   FU with me in one year.     Signed, Rollene Rotunda, MD  03/26/2020 11:37 AM    [facility]

## 2020-03-26 ENCOUNTER — Other Ambulatory Visit: Payer: Self-pay | Admitting: Internal Medicine

## 2020-03-26 ENCOUNTER — Encounter: Payer: Self-pay | Admitting: Cardiology

## 2020-03-26 ENCOUNTER — Other Ambulatory Visit: Payer: Self-pay

## 2020-03-26 ENCOUNTER — Ambulatory Visit (INDEPENDENT_AMBULATORY_CARE_PROVIDER_SITE_OTHER): Payer: Medicare Other | Admitting: Cardiology

## 2020-03-26 VITALS — BP 144/89 | Ht 66.0 in | Wt 193.4 lb

## 2020-03-26 DIAGNOSIS — E118 Type 2 diabetes mellitus with unspecified complications: Secondary | ICD-10-CM | POA: Diagnosis not present

## 2020-03-26 DIAGNOSIS — R0602 Shortness of breath: Secondary | ICD-10-CM | POA: Diagnosis not present

## 2020-03-26 DIAGNOSIS — E785 Hyperlipidemia, unspecified: Secondary | ICD-10-CM

## 2020-03-26 DIAGNOSIS — Z7189 Other specified counseling: Secondary | ICD-10-CM

## 2020-03-26 DIAGNOSIS — I2510 Atherosclerotic heart disease of native coronary artery without angina pectoris: Secondary | ICD-10-CM

## 2020-03-26 NOTE — Telephone Encounter (Signed)
Lyrica refill

## 2020-03-26 NOTE — Patient Instructions (Signed)
Medication Instructions:   No changes  *If you need a refill on your cardiac medications before your next appointment, please call your pharmacy*   Lab Work: Not needed    Testing/Procedures:  Not needed  Follow-Up: At [facility], you and your health needs are our priority.  As part of our continuing mission to provide you with exceptional heart care, we have created designated Provider Care Teams.  These Care Teams include your primary Cardiologist (physician) and Advanced Practice Providers (APPs -  Physician Assistants and Nurse Practitioners) who all work together to provide you with the care you need, when you need it.     Your next appointment:   12 month(s)  The format for your next appointment:   In Person  Provider:   James Hochrein, MD   

## 2020-03-29 ENCOUNTER — Encounter: Payer: Self-pay | Admitting: Internal Medicine

## 2020-03-29 ENCOUNTER — Ambulatory Visit (INDEPENDENT_AMBULATORY_CARE_PROVIDER_SITE_OTHER): Payer: Medicare Other

## 2020-03-29 ENCOUNTER — Other Ambulatory Visit: Payer: Self-pay

## 2020-03-29 ENCOUNTER — Ambulatory Visit (INDEPENDENT_AMBULATORY_CARE_PROVIDER_SITE_OTHER): Payer: Medicare Other | Admitting: Internal Medicine

## 2020-03-29 VITALS — BP 122/78 | HR 70 | Temp 98.0°F | Ht 64.0 in | Wt 189.6 lb

## 2020-03-29 DIAGNOSIS — I7090 Unspecified atherosclerosis: Secondary | ICD-10-CM

## 2020-03-29 DIAGNOSIS — M25511 Pain in right shoulder: Secondary | ICD-10-CM | POA: Diagnosis not present

## 2020-03-29 DIAGNOSIS — Z6832 Body mass index (BMI) 32.0-32.9, adult: Secondary | ICD-10-CM

## 2020-03-29 DIAGNOSIS — I10 Essential (primary) hypertension: Secondary | ICD-10-CM | POA: Diagnosis not present

## 2020-03-29 DIAGNOSIS — Z794 Long term (current) use of insulin: Secondary | ICD-10-CM

## 2020-03-29 DIAGNOSIS — E66811 Obesity, class 1: Secondary | ICD-10-CM

## 2020-03-29 DIAGNOSIS — N182 Chronic kidney disease, stage 2 (mild): Secondary | ICD-10-CM

## 2020-03-29 DIAGNOSIS — Z0000 Encounter for general adult medical examination without abnormal findings: Secondary | ICD-10-CM | POA: Diagnosis not present

## 2020-03-29 DIAGNOSIS — E1122 Type 2 diabetes mellitus with diabetic chronic kidney disease: Secondary | ICD-10-CM | POA: Diagnosis not present

## 2020-03-29 DIAGNOSIS — E6609 Other obesity due to excess calories: Secondary | ICD-10-CM

## 2020-03-29 DIAGNOSIS — Z79899 Other long term (current) drug therapy: Secondary | ICD-10-CM

## 2020-03-29 DIAGNOSIS — I129 Hypertensive chronic kidney disease with stage 1 through stage 4 chronic kidney disease, or unspecified chronic kidney disease: Secondary | ICD-10-CM

## 2020-03-29 DIAGNOSIS — G8929 Other chronic pain: Secondary | ICD-10-CM

## 2020-03-29 DIAGNOSIS — E785 Hyperlipidemia, unspecified: Secondary | ICD-10-CM

## 2020-03-29 LAB — POCT URINALYSIS DIPSTICK
Bilirubin, UA: NEGATIVE
Blood, UA: NEGATIVE
Glucose, UA: NEGATIVE
Ketones, UA: NEGATIVE
Nitrite, UA: NEGATIVE
Protein, UA: POSITIVE — AB
Spec Grav, UA: 1.02 (ref 1.010–1.025)
Urobilinogen, UA: 0.2 E.U./dL
pH, UA: 7.5 (ref 5.0–8.0)

## 2020-03-29 LAB — POCT UA - MICROALBUMIN
Albumin/Creatinine Ratio, Urine, POC: <30
Creatinine, POC: 100 mg/dL
Microalbumin Ur, POC: 80 mg/L

## 2020-03-29 NOTE — Addendum Note (Signed)
Addended by: Elisha Ponder E on: 03/29/2020 11:33 AM   Modules accepted: Orders

## 2020-03-29 NOTE — Patient Instructions (Signed)
Health Maintenance, Female Adopting a healthy lifestyle and getting preventive care are important in promoting health and wellness. Ask your health care provider about:  The right schedule for you to have regular tests and exams.  Things you can do on your own to prevent diseases and keep yourself healthy. What should I know about diet, weight, and exercise? Eat a healthy diet   Eat a diet that includes plenty of vegetables, fruits, low-fat dairy products, and lean protein.  Do not eat a lot of foods that are high in solid fats, added sugars, or sodium. Maintain a healthy weight Body mass index (BMI) is used to identify weight problems. It estimates body fat based on height and weight. Your health care provider can help determine your BMI and help you achieve or maintain a healthy weight. Get regular exercise Get regular exercise. This is one of the most important things you can do for your health. Most adults should:  Exercise for at least 150 minutes each week. The exercise should increase your heart rate and make you sweat (moderate-intensity exercise).  Do strengthening exercises at least twice a week. This is in addition to the moderate-intensity exercise.  Spend less time sitting. Even light physical activity can be beneficial. Watch cholesterol and blood lipids Have your blood tested for lipids and cholesterol at 69 years of age, then have this test every 5 years. Have your cholesterol levels checked more often if:  Your lipid or cholesterol levels are high.  You are older than 69 years of age.  You are at high risk for heart disease. What should I know about cancer screening? Depending on your health history and family history, you may need to have cancer screening at various ages. This may include screening for:  Breast cancer.  Cervical cancer.  Colorectal cancer.  Skin cancer.  Lung cancer. What should I know about heart disease, diabetes, and high blood  pressure? Blood pressure and heart disease  High blood pressure causes heart disease and increases the risk of stroke. This is more likely to develop in people who have high blood pressure readings, are of African descent, or are overweight.  Have your blood pressure checked: ? Every 3-5 years if you are 18-39 years of age. ? Every year if you are 40 years old or older. Diabetes Have regular diabetes screenings. This checks your fasting blood sugar level. Have the screening done:  Once every three years after age 40 if you are at a normal weight and have a low risk for diabetes.  More often and at a younger age if you are overweight or have a high risk for diabetes. What should I know about preventing infection? Hepatitis B If you have a higher risk for hepatitis B, you should be screened for this virus. Talk with your health care provider to find out if you are at risk for hepatitis B infection. Hepatitis C Testing is recommended for:  Everyone born from 1945 through 1965.  Anyone with known risk factors for hepatitis C. Sexually transmitted infections (STIs)  Get screened for STIs, including gonorrhea and chlamydia, if: ? You are sexually active and are younger than 69 years of age. ? You are older than 69 years of age and your health care provider tells you that you are at risk for this type of infection. ? Your sexual activity has changed since you were last screened, and you are at increased risk for chlamydia or gonorrhea. Ask your health care provider if   you are at risk.  Ask your health care provider about whether you are at high risk for HIV. Your health care provider may recommend a prescription medicine to help prevent HIV infection. If you choose to take medicine to prevent HIV, you should first get tested for HIV. You should then be tested every 3 months for as long as you are taking the medicine. Pregnancy  If you are about to stop having your period (premenopausal) and  you may become pregnant, seek counseling before you get pregnant.  Take 400 to 800 micrograms (mcg) of folic acid every day if you become pregnant.  Ask for birth control (contraception) if you want to prevent pregnancy. Osteoporosis and menopause Osteoporosis is a disease in which the bones lose minerals and strength with aging. This can result in bone fractures. If you are 65 years old or older, or if you are at risk for osteoporosis and fractures, ask your health care provider if you should:  Be screened for bone loss.  Take a calcium or vitamin D supplement to lower your risk of fractures.  Be given hormone replacement therapy (HRT) to treat symptoms of menopause. Follow these instructions at home: Lifestyle  Do not use any products that contain nicotine or tobacco, such as cigarettes, e-cigarettes, and chewing tobacco. If you need help quitting, ask your health care provider.  Do not use street drugs.  Do not share needles.  Ask your health care provider for help if you need support or information about quitting drugs. Alcohol use  Do not drink alcohol if: ? Your health care provider tells you not to drink. ? You are pregnant, may be pregnant, or are planning to become pregnant.  If you drink alcohol: ? Limit how much you use to 0-1 drink a day. ? Limit intake if you are breastfeeding.  Be aware of how much alcohol is in your drink. In the [place] one drink equals one 12 oz bottle of beer (355 mL), one 5 oz glass of wine (148 mL), or one 1 oz glass of hard liquor (44 mL). General instructions  Schedule regular health, dental, and eye exams.  Stay current with your vaccines.  Tell your health care provider if: ? You often feel depressed. ? You have ever been abused or do not feel safe at home. Summary  Adopting a healthy lifestyle and getting preventive care are important in promoting health and wellness.  Follow your health care provider's instructions about healthy  diet, exercising, and getting tested or screened for diseases.  Follow your health care provider's instructions on monitoring your cholesterol and blood pressure. This information is not intended to replace advice given to you by your health care provider. Make sure you discuss any questions you have with your health care provider. Document Revised: 06/02/2018 Document Reviewed: 06/02/2018 Elsevier Patient Education  2020 [employer] Inc[employer]  

## 2020-03-29 NOTE — Progress Notes (Signed)
I,Katawbba Wiggins,acting as a Education administrator for [name], MD.,have documented all relevant documentation on the behalf of [name], MD,as directed by  [name], MD while in the presence of [name], MD.  This visit occurred during the SARS-CoV-2 public health emergency.  Safety protocols were in place, including screening questions prior to the visit, additional usage of staff PPE, and extensive cleaning of exam room while observing appropriate contact time as indicated for disinfecting solutions.  Subjective:     Patient ID: [name] [name] , female    DOB: 1950-07-19 , 69 y.o.   MRN: [id number]   Chief Complaint  Patient presents with  . Annual Exam  . Diabetes  . Hypertension    HPI  She is here today for a full physical examination. She reports compliance with meds. She denies headaches, chest pain and shortness of breath.   Diabetes She presents for her follow-up diabetic visit. She has type 2 diabetes mellitus. Her disease course has been improving. Pertinent negatives for diabetes include no blurred vision and no chest pain. There are no hypoglycemic complications. Risk factors for coronary artery disease include diabetes mellitus, dyslipidemia, hypertension and post-menopausal. Current diabetic treatment includes insulin injections. She is compliant with treatment most of the time. She is following a diabetic diet. She participates in exercise intermittently. Eye exam is current.  Hypertension This is a chronic problem. The current episode started more than 1 year ago. The problem has been gradually improving since onset. The problem is controlled. Pertinent negatives include no blurred vision, chest pain or palpitations. Past treatments include angiotensin blockers. The current treatment provides moderate improvement. Compliance problems include exercise and diet.  Hypertensive end-organ damage includes kidney disease. Identifiable causes of hypertension include  chronic renal disease.     Past Medical History:  Diagnosis Date  . Anemia   . Carpal tunnel syndrome   . Coronary artery disease    DES to ostial RCA 2005  . Diabetes mellitus without complication ([place])    No longer taking meds  . Dyslipidemia   . Hypertension   . Osteoarthritis   . PAD (peripheral artery disease) ([place])   . Sleep apnea      Family History  Problem Relation Age of Onset  . CAD Father 61  . Heart attack Father   . CAD Mother 72  . Heart attack Mother   . CAD Brother   . Cancer Brother        prostate  . CAD Sister   . Cancer Sister        breast  . CAD Brother      Current Outpatient Medications:  .  Accu-Chek FastClix Lancets MISC, CHECK BLOOD SUGAR THREE TIMES DAILY dx: e11.65, Disp: 100 each, Rfl: 2 .  amLODipine (NORVASC) 10 MG tablet, TAKE 1 TABLET(10 MG) BY MOUTH DAILY, Disp: 90 tablet, Rfl: 1 .  aspirin 81 MG tablet, Take 81 mg by mouth daily., Disp: , Rfl:  .  beta carotene 25000 UNIT capsule, Take 25,000 Units by mouth daily.  (Patient not taking: Reported on 03/29/2020), Disp: , Rfl:  .  Cholecalciferol ([employer] VIT D 5000 HIGH-POTENCY PO), Take 1 tablet by mouth daily. , Disp: , Rfl:  .  CHROMIUM PICOLINATE PO, Take 200 mcg by mouth daily. , Disp: , Rfl:  .  Coenzyme Q10 (COQ-10) 100 MG CAPS, Take 1 tablet by mouth daily. , Disp: , Rfl:  .  Continuous Blood Gluc Receiver (FREESTYLE LIBRE 2  READER) DEVI, Use as directed to check blood sugars 3 times per day dx: e11.65, Disp: 1 each, Rfl: 1 .  Continuous Blood Gluc Sensor (FREESTYLE LIBRE 2 SENSOR) MISC, Use as directed to check blood sugars 3 times per day dx: e11.65, Disp: 6 each, Rfl: 1 .  Cyanocobalamin (VITAMIN B-12 PO), Take 500 mcg by mouth. , Disp: , Rfl:  .  Flaxseed, Linseed, (FLAXSEED OIL) 1000 MG CAPS, Take 1 capsule by mouth daily., Disp: , Rfl:  .  glucosamine-chondroitin 500-400 MG tablet, Take 1 tablet by mouth daily. Reported on 12/17/2015, Disp: , Rfl:  .  glucose blood (ONETOUCH  VERIO) test strip, CHECK BLOOD SUGAR THREE TIMES DAILY dx: e11.65, Disp: 150 each, Rfl: 11 .  Ibuprofen-Acetaminophen (ADVIL DUAL ACTION PO), Take by mouth. As needed, Disp: , Rfl:  .  Insulin Aspart, w/Niacinamide, (FIASP FLEXTOUCH [place]), Inject into the skin. Use sliding scale as directed three times daily. Inject 2 units for BG 150-199, 4 units for BG 200-249, 6 units for BG 250-299, 8 units for BG 300-340, 10 units for BG>350, BG>400 call MD, Disp: , Rfl:  .  insulin degludec (TRESIBA FLEXTOUCH) 200 UNIT/ML FlexTouch Pen, Inject 26 Units into the skin daily. Max dose is 80 units, Disp: 15 pen, Rfl: 1 .  Insulin Pen Needle (BD PEN NEEDLE NANO 2ND GEN) 32G X 4 MM MISC, USE TO INJECT INSULIN  3 TIMES DAILY, Disp: 100 each, Rfl: 2 .  MAGNESIUM OXIDE PO, Take 500 mg by mouth daily. , Disp: , Rfl:  .  metoprolol tartrate (LOPRESSOR) 50 MG tablet, TAKE 1 TABLET BY MOUTH TWICE DAILY, Disp: 180 tablet, Rfl: 4 .  Misc Natural Products (NEURIVA) CAPS, Take 1 capsule by mouth daily., Disp: , Rfl:  .  nystatin (NYSTATIN) powder, Apply topically 3 (three) times daily. As needed, Disp: 60 g, Rfl: 0 .  omeprazole (PRILOSEC) 20 MG capsule, TAKE ONE CAPSULE BY MOUTH DAILY., Disp: 90 capsule, Rfl: 1 .  ONETOUCH DELICA LANCETS 41O MISC, USE AS DIRECTED TO CHECK BLOOD SUGAR THREE TIMES DAILY, Disp: 100 each, Rfl: 0 .  pregabalin (LYRICA) 50 MG capsule, TAKE 1 CAPSULE(50 MG) BY MOUTH TWICE DAILY, Disp: 180 capsule, Rfl: 0 .  Psyllium (METAMUCIL FIBER PO), Take by mouth., Disp: , Rfl:  .  rosuvastatin (CRESTOR) 20 MG tablet, TAKE 1 TABLET(20 MG) BY MOUTH DAILY, Disp: 90 tablet, Rfl: 1 .  traZODone (DESYREL) 50 MG tablet, TAKE 1 TABLET BY MOUTH EVERY NIGHT AT BEDTIME, Disp: 90 tablet, Rfl: 1 .  TRULICITY 1.5 IN/8.6VE SOPN, ADMINISTER 1.5 MG UNDER THE SKIN 1 TIME A WEEK, Disp: 6 mL, Rfl: 3 .  Vitamin A 3 MG (10000 UT) TABS, Take 1 tablet by mouth daily., Disp: , Rfl:  .  vitamin C (ASCORBIC ACID) 500 MG tablet, Take 1,000  mg by mouth 2 (two) times daily. , Disp: , Rfl:  .  Vitamin D, Ergocalciferol, (DRISDOL) 1.25 MG (50000 UNIT) CAPS capsule, TAKE 1 CAPSULE BY MOUTH EVERY 7 DAYS, Disp: 12 capsule, Rfl: 1 .  vitamin E 600 UNIT capsule, Take 400 Units by mouth daily. , Disp: , Rfl:    Allergies  Allergen Reactions  . [place]  . Latex Rash      The patient states she uses post menopausal status for birth control. Last LMP was No LMP recorded. Patient is postmenopausal.. Negative for Dysmenorrhea. Negative for: breast discharge, breast lump(s), breast pain and breast self exam. Associated  symptoms include abnormal vaginal bleeding. Pertinent negatives include abnormal bleeding (hematology), anxiety, decreased libido, depression, difficulty falling sleep, dyspareunia, history of infertility, nocturia, sexual dysfunction, sleep disturbances, urinary incontinence, urinary urgency, vaginal discharge and vaginal itching. Diet regular.The patient states her exercise level is  intermittent.   . The patient's tobacco use is:  Social History   Tobacco Use  Smoking Status Former Smoker  . Packs/day: 0.50  . Years: 30.00  . Pack years: 15.00  . Types: Cigarettes  . Quit date: 12/20/2013  . Years since quitting: 6.2  Smokeless Tobacco Never Used  . She has been exposed to passive smoke. The patient's alcohol use is:  Social History   Substance and Sexual Activity  Alcohol Use Yes   Comment: rarely   Review of Systems  Constitutional: Negative.   HENT: Negative.   Eyes: Negative.  Negative for blurred vision.  Respiratory: Negative.   Cardiovascular: Negative.  Negative for chest pain and palpitations.  Gastrointestinal: Negative.   Endocrine: Negative.   Genitourinary: Negative.   Musculoskeletal: Positive for arthralgias.       She c/o r shoulder pain. Denies fall/trauma. Has pain with lifting her arm above her head. She reports her sx started years ago.  She feels her sx are worsening. Pain  described as dull, throbbing.   Skin: Negative.   Allergic/Immunologic: Negative.   Neurological: Negative.   Hematological: Negative.   Psychiatric/Behavioral: Negative.      Today's Vitals   03/29/20 0957  BP: 122/78  Pulse: 70  Temp: 98 F (36.7 C)  TempSrc: Oral  Weight: 189 lb 9.6 oz (86 kg)  Height: 5' 4"  (1.626 m)   Body mass index is 32.54 kg/m.  Wt Readings from Last 3 Encounters:  03/29/20 189 lb 9.6 oz (86 kg)  03/29/20 189 lb 9.6 oz (86 kg)  03/26/20 193 lb 6.4 oz (87.7 kg)   Objective:  Physical Exam Vitals and nursing note reviewed.  Constitutional:      Appearance: Normal appearance. She is obese.  HENT:     Head: Normocephalic and atraumatic.     Right Ear: Tympanic membrane, ear canal and external ear normal.     Left Ear: Tympanic membrane, ear canal and external ear normal.     Nose:     Comments: Deferred, masked    Mouth/Throat:     Comments: Deferred, masked Eyes:     Extraocular Movements: Extraocular movements intact.     Conjunctiva/sclera: Conjunctivae normal.     Pupils: Pupils are equal, round, and reactive to light.  Cardiovascular:     Rate and Rhythm: Normal rate and regular rhythm.     Pulses: Normal pulses.          Dorsalis pedis pulses are 2+ on the right side and 2+ on the left side.     Heart sounds: Normal heart sounds.  Pulmonary:     Effort: Pulmonary effort is normal.     Breath sounds: Normal breath sounds.  Chest:     Breasts: Tanner Score is 5.        Right: Normal.        Left: Normal.  Abdominal:     General: Abdomen is flat. Bowel sounds are normal.     Palpations: Abdomen is soft.  Genitourinary:    Comments: deferred Musculoskeletal:        General: No swelling. Normal range of motion.     Cervical back: Normal range of motion and neck supple.  Comments: r shoulder tender to palpation. She has pain with abduction.   Feet:     Right foot:     Protective Sensation: 5 sites tested. 5 sites sensed.      Skin integrity: Dry skin present.     Toenail Condition: Right toenails are abnormally thick.     Left foot:     Protective Sensation: 5 sites tested. 5 sites sensed.     Skin integrity: Dry skin present.     Toenail Condition: Left toenails are abnormally thick.  Skin:    General: Skin is warm and dry.  Neurological:     General: No focal deficit present.     Mental Status: She is alert and oriented to person, place, and time.  Psychiatric:        Mood and Affect: Mood normal.        Behavior: Behavior normal.         Assessment And Plan:     1. Routine general medical examination at health care facility Comments: A full exam was performed. Importance of monthly self breast exams was discussed. PATIENT IS ADVISED TO GET 30-45 MINUTES REGULAR EXERCISE NO LESS THAN FOUR TO FIVE DAYS PER WEEK - BOTH WEIGHTBEARING EXERCISES AND AEROBIC ARE RECOMMENDED.  PATIENT IS ADVISED TO FOLLOW A HEALTHY DIET WITH AT LEAST SIX FRUITS/VEGGIES PER DAY, DECREASE INTAKE OF RED MEAT, AND TO INCREASE FISH INTAKE TO TWO DAYS PER WEEK.  MEATS/FISH SHOULD NOT BE FRIED, BAKED OR BROILED IS PREFERABLE.  I SUGGEST WEARING SPF 50 SUNSCREEN ON EXPOSED PARTS AND ESPECIALLY WHEN IN THE DIRECT SUNLIGHT FOR AN EXTENDED PERIOD OF TIME.  PLEASE AVOID FAST FOOD RESTAURANTS AND INCREASE YOUR WATER INTAKE.   2. Type 2 diabetes mellitus with stage 2 chronic kidney disease, with long-term current use of insulin (HCC) Comments: Diabetic foot exam was performed.  She agrees to Podiatry referral for diabetic foot care. I DISCUSSED WITH THE PATIENT AT LENGTH REGARDING THE GOALS OF GLYCEMIC CONTROL AND POSSIBLE LONG-TERM COMPLICATIONS.  I  ALSO STRESSED THE IMPORTANCE OF COMPLIANCE WITH HOME GLUCOSE MONITORING, DIETARY RESTRICTIONS INCLUDING AVOIDANCE OF SUGARY DRINKS/PROCESSED FOODS,  ALONG WITH REGULAR EXERCISE.  I  ALSO STRESSED THE IMPORTANCE OF ANNUAL EYE EXAMS, SELF FOOT CARE AND COMPLIANCE WITH OFFICE VISITS.  - CMP14+EGFR -  CBC - Hemoglobin A1c - Ambulatory referral to Podiatry  3. Parenchymal renal hypertension, stage 1 through stage 4 or unspecified chronic kidney disease Comments: Chronic, well controlled. She will contine wiht current meds.She is encouraged to avoid adding salt to her foods. EKG performed at Cardiology visit was reviewed in full detail.   4. Chronic right shoulder pain Comments: She agrees to Ortho referral.  She is advised to apply topical pain cream to affected area twice daily as needed.   5. Dyslipidemia Comments: Chronic, she is encouraged to take rosuvastatin as prescribed. Encouraged to avoid fried foods and increase daily activity.   6. Atherosclerosis Comments: Chronic. Recent imaging reviewed. Importance of statin compliance and following a heart healthy regimen was stressed to the patient.   7. Class 1 obesity due to excess calories with serious comorbidity and body mass index (BMI) of 32.0 to 32.9 in adult Comments: She is encouraged to strive for BMI less than 30 to decrease cardiac risk. Advised to aim for at least 150 minutes of exercise per week.   8. Drug therapy Comments: I will check a vitamin B12 level.  - Vitamin B12   Patient was given opportunity to  ask questions. Patient verbalized understanding of the plan and was able to repeat key elements of the plan. All questions were answered to their satisfaction.   [name], MD   I, [name], MD, have reviewed all documentation for this visit. The documentation on 03/31/20 for the exam, diagnosis, procedures, and orders are all accurate and complete.  THE PATIENT IS ENCOURAGED TO PRACTICE SOCIAL DISTANCING DUE TO THE COVID-19 PANDEMIC.

## 2020-03-29 NOTE — Patient Instructions (Signed)
[name] [name] , Thank you for taking time to come for your Medicare Wellness Visit. I appreciate your ongoing commitment to your health goals. Please review the following plan we discussed and let me know if I can assist you in the future.   Screening recommendations/referrals: Colonoscopy: completed 10/12/2019 Mammogram: completed 11/23/2019 Bone Density: completed 05/28/2010 Recommended yearly ophthalmology/optometry visit for glaucoma screening and checkup Recommended yearly dental visit for hygiene and checkup  Vaccinations: Influenza vaccine: completed 03/07/2020 Pneumococcal vaccine: completed 10/23/2016 Tdap vaccine: completed 12/15/2014 Shingles vaccine: completed   Covid-19: 08/20/2019, 07/30/2019  Advanced directives: Advance directive discussed with you today. Even though you declined this today please call our office should you change your mind and we can give you the proper paperwork for you to fill out.  Conditions/risks identified: none  Next appointment: Follow up in one year for your annual wellness visit    Preventive Care 65 Years and Older, Female Preventive care refers to lifestyle choices and visits with your health care provider that can promote health and wellness. What does preventive care include?  A yearly physical exam. This is also called an annual well check.  Dental exams once or twice a year.  Routine eye exams. Ask your health care provider how often you should have your eyes checked.  Personal lifestyle choices, including:  Daily care of your teeth and gums.  Regular physical activity.  Eating a healthy diet.  Avoiding tobacco and drug use.  Limiting alcohol use.  Practicing safe sex.  Taking low-dose aspirin every day.  Taking vitamin and mineral supplements as recommended by your health care provider. What happens during an annual well check? The services and screenings done by your health care provider during your annual well check will  depend on your age, overall health, lifestyle risk factors, and family history of disease. Counseling  Your health care provider may ask you questions about your:  Alcohol use.  Tobacco use.  Drug use.  Emotional well-being.  Home and relationship well-being.  Sexual activity.  Eating habits.  History of falls.  Memory and ability to understand (cognition).  Work and work Astronomer.  Reproductive health. Screening  You may have the following tests or measurements:  Height, weight, and BMI.  Blood pressure.  Lipid and cholesterol levels. These may be checked every 5 years, or more frequently if you are over 76 years old.  Skin check.  Lung cancer screening. You may have this screening every year starting at age 55 if you have a 30-pack-year history of smoking and currently smoke or have quit within the past 15 years.  Fecal occult blood test (FOBT) of the stool. You may have this test every year starting at age 2.  Flexible sigmoidoscopy or colonoscopy. You may have a sigmoidoscopy every 5 years or a colonoscopy every 10 years starting at age 61.  Hepatitis C blood test.  Hepatitis B blood test.  Sexually transmitted disease (STD) testing.  Diabetes screening. This is done by checking your blood sugar (glucose) after you have not eaten for a while (fasting). You may have this done every 1-3 years.  Bone density scan. This is done to screen for osteoporosis. You may have this done starting at age 38.  Mammogram. This may be done every 1-2 years. Talk to your health care provider about how often you should have regular mammograms. Talk with your health care provider about your test results, treatment options, and if necessary, the need for more tests. Vaccines  Your  health care provider may recommend certain vaccines, such as:  Influenza vaccine. This is recommended every year.  Tetanus, diphtheria, and acellular pertussis (Tdap, Td) vaccine. You may need a  Td booster every 10 years.  Zoster vaccine. You may need this after age 72.  Pneumococcal 13-valent conjugate (PCV13) vaccine. One dose is recommended after age 1.  Pneumococcal polysaccharide (PPSV23) vaccine. One dose is recommended after age 42. Talk to your health care provider about which screenings and vaccines you need and how often you need them. This information is not intended to replace advice given to you by your health care provider. Make sure you discuss any questions you have with your health care provider. Document Released: 07/06/2015 Document Revised: 02/27/2016 Document Reviewed: 04/10/2015 Elsevier Interactive Patient Education  2017 [place] Prevention in the Home Falls can cause injuries. They can happen to people of all ages. There are many things you can do to make your home safe and to help prevent falls. What can I do on the outside of my home?  Regularly fix the edges of walkways and driveways and fix any cracks.  Remove anything that might make you trip as you walk through a door, such as a raised step or threshold.  Trim any bushes or trees on the path to your home.  Use bright outdoor lighting.  Clear any walking paths of anything that might make someone trip, such as rocks or tools.  Regularly check to see if handrails are loose or broken. Make sure that both sides of any steps have handrails.  Any raised decks and porches should have guardrails on the edges.  Have any leaves, snow, or ice cleared regularly.  Use sand or salt on walking paths during winter.  Clean up any spills in your garage right away. This includes oil or grease spills. What can I do in the bathroom?  Use night lights.  Install grab bars by the toilet and in the tub and shower. Do not use towel bars as grab bars.  Use non-skid mats or decals in the tub or shower.  If you need to sit down in the shower, use a plastic, non-slip stool.  Keep the floor dry. Clean  up any water that spills on the floor as soon as it happens.  Remove soap buildup in the tub or shower regularly.  Attach bath mats securely with double-sided non-slip rug tape.  Do not have throw rugs and other things on the floor that can make you trip. What can I do in the bedroom?  Use night lights.  Make sure that you have a light by your bed that is easy to reach.  Do not use any sheets or blankets that are too big for your bed. They should not hang down onto the floor.  Have a firm chair that has side arms. You can use this for support while you get dressed.  Do not have throw rugs and other things on the floor that can make you trip. What can I do in the kitchen?  Clean up any spills right away.  Avoid walking on wet floors.  Keep items that you use a lot in easy-to-reach places.  If you need to reach something above you, use a strong step stool that has a grab bar.  Keep electrical cords out of the way.  Do not use floor polish or wax that makes floors slippery. If you must use wax, use non-skid floor wax.  Do not  have throw rugs and other things on the floor that can make you trip. What can I do with my stairs?  Do not leave any items on the stairs.  Make sure that there are handrails on both sides of the stairs and use them. Fix handrails that are broken or loose. Make sure that handrails are as long as the stairways.  Check any carpeting to make sure that it is firmly attached to the stairs. Fix any carpet that is loose or worn.  Avoid having throw rugs at the top or bottom of the stairs. If you do have throw rugs, attach them to the floor with carpet tape.  Make sure that you have a light switch at the top of the stairs and the bottom of the stairs. If you do not have them, ask someone to add them for you. What else can I do to help prevent falls?  Wear shoes that:  Do not have high heels.  Have rubber bottoms.  Are comfortable and fit you well.  Are  closed at the toe. Do not wear sandals.  If you use a stepladder:  Make sure that it is fully opened. Do not climb a closed stepladder.  Make sure that both sides of the stepladder are locked into place.  Ask someone to hold it for you, if possible.  Clearly mark and make sure that you can see:  Any grab bars or handrails.  First and last steps.  Where the edge of each step is.  Use tools that help you move around (mobility aids) if they are needed. These include:  Canes.  Walkers.  Scooters.  Crutches.  Turn on the lights when you go into a dark area. Replace any light bulbs as soon as they burn out.  Set up your furniture so you have a clear path. Avoid moving your furniture around.  If any of your floors are uneven, fix them.  If there are any pets around you, be aware of where they are.  Review your medicines with your doctor. Some medicines can make you feel dizzy. This can increase your chance of falling. Ask your doctor what other things that you can do to help prevent falls. This information is not intended to replace advice given to you by your health care provider. Make sure you discuss any questions you have with your health care provider. Document Released: 04/05/2009 Document Revised: 11/15/2015 Document Reviewed: 07/14/2014 Elsevier Interactive Patient Education  2017 [employer].

## 2020-03-29 NOTE — Progress Notes (Signed)
This visit occurred during the SARS-CoV-2 public health emergency.  Safety protocols were in place, including screening questions prior to the visit, additional usage of staff PPE, and extensive cleaning of exam room while observing appropriate contact time as indicated for disinfecting solutions.  Subjective:   [name] [name] is a 69 y.o. female who presents for Medicare Annual (Subsequent) preventive examination.  Review of Systems     Cardiac Risk Factors include: advanced age (>54mn, >>96women);diabetes mellitus;hypertension;obesity (BMI >30kg/m2);dyslipidemia;sedentary lifestyle     Objective:    Today's Vitals   03/29/20 0948 03/29/20 0949  BP: 122/78   Pulse: 70   Temp: 98 F (36.7 C)   TempSrc: Oral   SpO2: 97%   Weight: 189 lb 9.6 oz (86 kg)   Height: 5' 4"  (1.626 m)   PainSc:  7    Body mass index is 32.54 kg/m.  Advanced Directives 03/29/2020 01/26/2020 09/16/2019 01/26/2019 05/19/2015 09/19/2014 08/03/2014  Does Patient Have a Medical Advance Directive? No No No No No No No  Would patient like information on creating a medical advance directive? No - Patient declined No - Patient declined No - Patient declined No - Patient declined No - patient declined information No - patient declined information -    Current Medications (verified) Outpatient Encounter Medications as of 03/29/2020  Medication Sig  . Accu-Chek FastClix Lancets MISC CHECK BLOOD SUGAR THREE TIMES DAILY dx: e11.65  . amLODipine (NORVASC) 10 MG tablet TAKE 1 TABLET(10 MG) BY MOUTH DAILY  . aspirin 81 MG tablet Take 81 mg by mouth daily.  . Cholecalciferol ([employer] VIT D 5000 HIGH-POTENCY PO) Take 1 tablet by mouth daily.   . CHROMIUM PICOLINATE PO Take 200 mcg by mouth daily.   . Coenzyme Q10 (COQ-10) 100 MG CAPS Take 1 tablet by mouth daily.   . Continuous Blood Gluc Receiver (FREESTYLE LIBRE 2 READER) DEVI Use as directed to check blood sugars 3 times per day dx: e11.65  . Continuous Blood Gluc Sensor  (FREESTYLE LIBRE 2 SENSOR) MISC Use as directed to check blood sugars 3 times per day dx: e11.65  . Cyanocobalamin (VITAMIN B-12 PO) Take 500 mcg by mouth.   . Flaxseed, Linseed, (FLAXSEED OIL) 1000 MG CAPS Take 1 capsule by mouth daily.  .Marland Kitchenglucosamine-chondroitin 500-400 MG tablet Take 1 tablet by mouth daily. Reported on 12/17/2015  . glucose blood (ONETOUCH VERIO) test strip CHECK BLOOD SUGAR THREE TIMES DAILY dx: e11.65  . Ibuprofen-Acetaminophen (ADVIL DUAL ACTION PO) Take by mouth. As needed  . Insulin Aspart, w/Niacinamide, (FIASP FLEXTOUCH [place]) Inject into the skin. Use sliding scale as directed three times daily. Inject 2 units for BG 150-199, 4 units for BG 200-249, 6 units for BG 250-299, 8 units for BG 300-340, 10 units for BG>350, BG>400 call MD  . insulin degludec (TRESIBA FLEXTOUCH) 200 UNIT/ML FlexTouch Pen Inject 26 Units into the skin daily. Max dose is 80 units  . Insulin Pen Needle (BD PEN NEEDLE NANO 2ND GEN) 32G X 4 MM MISC USE TO INJECT INSULIN  3 TIMES DAILY  . MAGNESIUM OXIDE PO Take 500 mg by mouth daily.   . metoprolol tartrate (LOPRESSOR) 50 MG tablet TAKE 1 TABLET BY MOUTH TWICE DAILY  . Misc Natural Products (NEURIVA) CAPS Take 1 capsule by mouth daily.  .Marland Kitchennystatin (NYSTATIN) powder Apply topically 3 (three) times daily. As needed  . omeprazole (PRILOSEC) 20 MG capsule TAKE ONE CAPSULE BY MOUTH DAILY.  .Marland KitchenONETOUCH DELICA LANCETS 391MMISC USE AS  DIRECTED TO CHECK BLOOD SUGAR THREE TIMES DAILY  . pregabalin (LYRICA) 50 MG capsule TAKE 1 CAPSULE(50 MG) BY MOUTH TWICE DAILY  . Psyllium (METAMUCIL FIBER PO) Take by mouth.  . rosuvastatin (CRESTOR) 20 MG tablet TAKE 1 TABLET(20 MG) BY MOUTH DAILY  . traZODone (DESYREL) 50 MG tablet TAKE 1 TABLET BY MOUTH EVERY NIGHT AT BEDTIME  . TRULICITY 1.5 BE/0.1EO SOPN ADMINISTER 1.5 MG UNDER THE SKIN 1 TIME A WEEK  . Vitamin A 3 MG (10000 UT) TABS Take 1 tablet by mouth daily.  . vitamin C (ASCORBIC ACID) 500 MG tablet Take 1,000 mg by  mouth 2 (two) times daily.   . Vitamin D, Ergocalciferol, (DRISDOL) 1.25 MG (50000 UNIT) CAPS capsule TAKE 1 CAPSULE BY MOUTH EVERY 7 DAYS  . vitamin E 600 UNIT capsule Take 400 Units by mouth daily.   . beta carotene 25000 UNIT capsule Take 25,000 Units by mouth daily.  (Patient not taking: Reported on 03/29/2020)   No facility-administered encounter medications on file as of 03/29/2020.    Allergies (verified) Fish allergy and Latex   History: Past Medical History:  Diagnosis Date  . Anemia   . Carpal tunnel syndrome   . Coronary artery disease    DES to ostial RCA 2005  . Diabetes mellitus without complication ([place])    No longer taking meds  . Dyslipidemia   . Hypertension   . Osteoarthritis   . PAD (peripheral artery disease) ([place])   . Sleep apnea    Past Surgical History:  Procedure Laterality Date  . APPENDECTOMY    . BREAST EXCISIONAL BIOPSY Right >10+ yrs ago   benign  . heart stent    . TONSILLECTOMY    . WRIST SURGERY     Family History  Problem Relation Age of Onset  . CAD Father 44  . Heart attack Father   . CAD Mother 33  . Heart attack Mother   . CAD Brother   . Cancer Brother        prostate  . CAD Sister   . Cancer Sister        breast  . CAD Brother    Social History   Socioeconomic History  . Marital status: Single    Spouse name: Not on file  . Number of children: 2  . Years of education: Not on file  . Highest education level: Some college, no degree  Occupational History  . Occupation: retired    Comment: disabled  Tobacco Use  . Smoking status: Former Smoker    Packs/day: 0.50    Years: 30.00    Pack years: 15.00    Types: Cigarettes    Quit date: 12/20/2013    Years since quitting: 6.2  . Smokeless tobacco: Never Used  Vaping Use  . Vaping Use: Never used  Substance and Sexual Activity  . Alcohol use: Yes    Comment: rarely  . Drug use: Not Currently    Comment: quit 2013  . Sexual activity: Not Currently  Other Topics  Concern  . Not on file  Social History Narrative   Lives alone.   One son died in a car accident.     Coffee in am ,1 cup   Social Determinants of Health   Financial Resource Strain: Low Risk   . Difficulty of Paying Living Expenses: Not hard at all  Food Insecurity: No Food Insecurity  . Worried About Charity fundraiser in the Last Year: Never true  .  Ran Out of Food in the Last Year: Never true  Transportation Needs: No Transportation Needs  . Lack of Transportation (Medical): No  . Lack of Transportation (Non-Medical): No  Physical Activity: Inactive  . Days of Exercise per Week: 0 days  . Minutes of Exercise per Session: 0 min  Stress: No Stress Concern Present  . Feeling of Stress : Not at all  Social Connections:   . Frequency of Communication with Friends and Family: Not on file  . Frequency of Social Gatherings with Friends and Family: Not on file  . Attends Religious Services: Not on file  . Active Member of Clubs or Organizations: Not on file  . Attends Archivist Meetings: Not on file  . Marital Status: Not on file    Tobacco Counseling Counseling given: Not Answered   Clinical Intake:  Pre-visit preparation completed: Yes  Pain : 0-10 Pain Score: 7  Pain Type: Chronic pain Pain Location: Shoulder Pain Orientation: Right Pain Descriptors / Indicators: Nagging Pain Onset: More than a month ago Pain Frequency: Constant Pain Relieving Factors: Advil dual eases off  Pain Relieving Factors: Advil dual eases off  Nutritional Status: BMI > 30  Obese Nutritional Risks: None Diabetes: Yes  How often do you need to have someone help you when you read instructions, pamphlets, or other written materials from your doctor or pharmacy?: 1 - Never What is the last grade level you completed in school?: some college  Diabetic? Yes Nutrition Risk Assessment:  Has the patient had any N/V/D within the last 2 months?  No  Does the patient have any  non-healing wounds?  No  Has the patient had any unintentional weight loss or weight gain?  No   Diabetes:  Is the patient diabetic?  Yes  If diabetic, was a CBG obtained today?  No  Did the patient bring in their glucometer from home?  No  How often do you monitor your CBG's? 4 times daily.   Financial Strains and Diabetes Management:  Are you having any financial strains with the device, your supplies or your medication? No .  Does the patient want to be seen by Chronic Care Management for management of their diabetes?  No  Would the patient like to be referred to a Nutritionist or for Diabetic Management?  No   Diabetic Exams:  Diabetic Eye Exam: Completed 11/14/2019 Diabetic Foot Exam: Completed today   Interpreter Needed?: No  Information entered by :: NAllen LPN   Activities of Daily Living In your present state of health, do you have any difficulty performing the following activities: 03/29/2020 02/02/2020  Hearing? N N  Vision? Y N  Comment blurry at times -  Difficulty concentrating or making decisions? N N  Walking or climbing stairs? N N  Dressing or bathing? N N  Doing errands, shopping? N N  Preparing Food and eating ? N -  Using the Toilet? N -  In the past six months, have you accidently leaked urine? Y -  Comment a little bit at night -  Do you have problems with loss of bowel control? N -  Managing your Medications? N -  Managing your Finances? N -  Housekeeping or managing your Housekeeping? N -  Some recent data might be hidden    Patient Care Team: Glendale Chard, MD as PCP - General (Internal Medicine) Minus Breeding, MD as PCP - Cardiology (Cardiology) Daneen Schick as Social Worker [place], Claudette Stapler, RN as Case Manager [place],  Kennieth Francois, [facility] (Pharmacist)  Indicate any recent Medical Services you may have received from other than [facility] providers in the past year (date may be approximate).     Assessment:   This is a routine wellness  examination for Diaz.  Hearing/Vision screen  Hearing Screening   125Hz  250Hz  500Hz  1000Hz  2000Hz  3000Hz  4000Hz  6000Hz  8000Hz   Right ear:           Left ear:           Vision Screening Comments: Regular eye exams, Dr. Katy Fitch  Dietary issues and exercise activities discussed: Current Exercise Habits: The patient does not participate in regular exercise at present  Goals    .  "I am concerned about having blood in my stool" (pt-stated)      CARE PLAN ENTRY (see longtitudinal plan of care for additional care plan information)  Current Barriers:  Marland Kitchen Knowledge Deficits related to evaluation and treatment for heme positive stool   . Chronic Disease Management support and education needs related to Impaired Bowel Elimination, DMII, HTN  Nurse Case Manager Clinical Goal(s):  Marland Kitchen Over the next 90 days, patient will work with Dr. Laurence Spates, [facility]  to address needs related to evaluation and treatment for heme positive stool   CCM RN CM Interventions:  09/08/19 call completed with patient . Evaluation of current treatment plan related to Impaired Bowel Elimination and heme positive stool and patient's adherence to plan as established by provider . Determined patient continues to have anxiety since having a heme positive stool, she is using her faith to help her cope but admits she is having increased anxiety  . Determined the referral to Gastroenterology with Dr. Alferd Apa GI is pending  . Discussed plans with patient for ongoing care management follow up and provided patient with direct contact information for care management team  Patient Self Care Activities:  . Patient verbalizes understanding of plan to expect a call from Dr. Laurence Spates, [facility]  [phone] . Self administers medications as prescribed . Attends all scheduled provider appointments . Calls pharmacy for medication refills . Performs ADL's independently . Performs IADL's independently . Calls provider office  for new concerns or questions  Please see past updates related to this goal by clicking on the "Past Updates" button in the selected goal      .  "I don't want to be Diabetic anymore" (pt-stated)      Current Barriers: CARE PLAN ENTRY (see longtitudinal plan of care for additional care plan information)  . Knowledge Deficits related to disease process and Self Health Management of Diabetes . Chronic Disease Management support and education needs related to DMII, HTN  Nurse Case Manager Clinical Goal(s):  Marland Kitchen Over the next 90 days, patient will work with RN CM to address needs related to weight loss plan and Diabetes 11/17/18: Target goal date revised to 90 days secondary to COVID-19 treatment delays -12/22/18 Goal Met . Over the next 30 days, patient will have increased understanding of what foods to eat per recommendations provided by the American Diabetic Association Goal Met  . 08/24/19 New Over the next 30 days, patient will verbalize improved adherence with following her diabetic diet by eating/drinking fewer carbohydrates and sugary foods   . 08/24/19 New Over the next 90 days, patient will achieve daily glycemic FBS within range of 80-130 and < 190 after meals      CCM RN CM Interventions: 09/14/19 inbound call completed with patient . Inbound call received  from patient to advise she received notification from her pharmacy that the [facility] is ready but will cost $137 and her lancets will cost $37 . Determined Ms. Santacroce has dual coverage Medicare/Medicaid and should not be charged a copay for these items . Collaborated with embedded Pharm D Lottie Dawson to ask for assistance in verifying coverage for these items . Determined the patient has a $0 copay for her lancets after providing the pharmacy updated insurance information . Determined the [facility] Rx will need to be resent to [facility] and should include the required clinical notes per PCP 09/15/19 placed outbound call  to patient  . Provided patient with an update concerning her lancets and Libre system, patient verbalizes understanding . Discussed patient's FBS today was 137; Advised patient, providing education and rationale, to check cbg 1-2 times daily and record, calling the CCM team and or PCP for findings outside established parameters; FBS 80-130; <190 after meals . Discussed plans with patient for ongoing care management follow up and provided patient with direct contact information for care management team  Patient Self Care Activities:  . Self administers medications as prescribed . Attends all scheduled provider appointments . Calls pharmacy for medication refills . Performs ADL's independently . Performs IADL's independently . Calls provider office for new concerns or questions  Please see past updates related to this goal by clicking on the "Past Updates" button in the selected goal      .  "I would like to feel better and have less anxiety about my health" (pt-stated)      [place] (see longtitudinal plan of care for additional care plan information)  Current Barriers:  Marland Kitchen Knowledge Deficits related to evaluation and treatment of anxiety and symptoms of illness of unknown origin  . Chronic Disease Management support and education needs related to DMII, HTN  Nurse Case Manager Clinical Goal(s):  Marland Kitchen Over the next 30 days, patient will work with PCP to address needs related to worsening anxiety related to declining health and symptoms of illness of unknown origin  CCM RN CM Interventions:  09/08/19 Inbound call completed with patient  . Evaluation of current treatment plan related to anxiety and acute symptoms of illness and patient's adherence to plan as established by provider . Determined patient has been more anxious since learning she had a heme positive stool; Determined she feel's being isolated for so long due to [place], is also contributing to her anxiety and feelings of  despair; Determined over the past couple of days, she has been feeling more fatigued and is experiencing body aches and is having increased blood glucose levels in the 200's . Determined patient has no symptoms of fever, chest pain, shortness of breath, nausea, vomiting or diarrhea but states "I just don't feel good" . Determined patient has completed both COVID 19 vaccines but would like to have a COVID test to "ease my mind" . Provided patient with active listening and validated her feelings of concern . Encouraged patient to use prayer and meditation to help her cope with her anxiety; encouraged to also use deep breathing exercises  . Instructed patient to stay well hydrated and to balance her activity with rest; continue to monitor for fever and continue to monitor her CBG's twice daily . Collaborated with PCP, Dr. Baird Cancer regarding patient's c/o and her request to get tested for COVID 19; advised Dr. Baird Cancer this RN CM was able to schedule Ms. Weisse for an OV with Dr. Baird Cancer per  the patient's request for this upcoming Monday, 09/12/19 at 9:15 AM . Sent in basket message to embedded Pharm D Lottie Dawson with an update regarding patient's scheduled OV with Dr. Baird Cancer set for Monday, 09/12/19 at 9:15 AM, reply received from [place] [facility] confirming she will f/u with Ms. Kelter at this time  . Discussed plans with patient for ongoing care management follow up and provided patient with direct contact information for care management team . Sent in basket message to [facility] with an update regarding patient's feeling of isolation and anxiety; requested Tillie Rung provide resources to Ms. Langenderfer regarding Senior activities she may be interested in at next f/u call   Patient Self Care Activities:  . Self administers medications as prescribed . Attends all scheduled provider appointments . Calls pharmacy for medication refills . Performs ADL's independently . Performs IADL's independently . Calls  provider office for new concerns or questions  Initial goal documentation     .  Assistance with obtaining FreeStyle Libre sensors      CARE PLAN ENTRY (see longitudinal plan of care for additional care plan information)  Current Barriers:  . Financial Barriers: patient has [employer] and reports copay for [employer] sensors is cost prohibitive at this time  Pharmacist Clinical Goal(s):  Marland Kitchen Over the next 30 days, patient will work with PharmD and providers to relieve medication access concerns  Interventions: . Inter-disciplinary care team collaboration (see longitudinal plan of care) . Collaborated with PCP to coordinate prescription for FreeStyle [place] 2 sensors be sent in the [place] . Notified patient prescription is being sent to DME pharmacy and advised patient she could pick up samples at the office  Patient Self Care Activities:  . Pick up samples of sensors . Provide any necessary information to [place] needed to coordinate delivery  Initial goal documentation     .  I would like to reduce my A1c  (pt-stated)      Pharmacist Clinical Goal(s):  Marland Kitchen Over the next 90 days, patient will demonstrate Improved medication adherence as evidenced by daily medication adherence, FBG <130 and controlled BGs throughout the day: 06/27/19: Target Goal Date revised to 90 days due to treatment delays secondary to COVID-19  Current Barriers:  . Diabetes: T2DM; most recent A1c 8.1% on 08/10/19.  Patient states she is adhering to a healthier diet. She has also lost 4 lbs.  She is working to lose more. . Current antihyperglycemic regimen: Tresiba 34 units (this has increased based on recent BGs), Trulicity 2.0NO weekly, Fiasp sample given--sliding scale reviewed per medication list o Multiclix lancets called into pharmacy--new insurance card updated, $0 copay o Libre CGM must be sent to [place] and new patient profile must be set up by calling  [phone]--cannot be filled at local pharmacy . denies hypoglycemic symptoms; denies hyperglycemic symptoms . Current meal patterns: patient is now mindful of carbohydrates; choosing meat and vegetables over carbs o Encouraged patient to avoid juices/sugary drinks . Current exercise: walking . Current blood glucose readings: FBG<130 . Cardiovascular risk reduction: o Current hypertensive regimen: amlodipine, metoprolol BID o Current hyperlipidemia regimen:  Crestor 39m daily, LDL 81 on 01/2019 o Discussed COVID 19 vaccine with patient   PharmD & CCM RN Interventions: . Comprehensive medication review performed, medication list updated in the EMR.  Reviewed medication fill history via insurance claims data confirming patient appears compliant with having her medications filled on time as prescribed by provider. . Reviewed & discussed the following  diabetes-related information with patient: o Follow ADA recommended "diabetes-friendly" diet  (reviewed healthy snack/food options) o Discussed insulin/GLP-1 injection technique; Patient uses One touch Verio glucometer o Reviewed medication purpose/side effects-->patient denies adverse events  Patient Self Care Activities:  . Patient will check blood glucose daily , document, and provide at future appointments . Patient will focus on medication adherence by continuing to take medications as prescribed . Patient will take medications as prescribed . Patient will contact provider with any episodes of hypoglycemia . Patient will report any questions or concerns to provider    Please see past updates related to this goal by clicking on the "Past Updates" button in the selected goal      .  Patient Stated      01/26/2019, Get off diabetic medication and lose weight    .  Patient Stated      03/29/2020, wants to get off medication    .  Pharmacy Care Plan      CARE PLAN ENTRY (see longitudinal plan of care for additional care plan  information)  Current Barriers:  . Chronic Disease Management support, education, and care coordination needs related to Hypertension, Hyperlipidemia, and Diabetes   Hypertension BP Readings from Last 3 Encounters:  10/25/19 128/76  09/12/19 122/84  08/10/19 126/82   . Pharmacist Clinical Goal(s): o Over the next 180 days, patient will work with PharmD and providers to maintain BP goal <130/80 . Current regimen:  . Amlodipine 51m daily . Metoprolol tartrate 552mtwice daily . Interventions: o Recommend patient check blood pressure a few times per month and if symptomatic . Patient self care activities - Over the next 180 days, patient will: o Check BP several times monthly and if symptomatic, document, and provide at future appointments o Ensure daily salt intake < 2300 mg/day  Hyperlipidemia Lab Results  Component Value Date/Time   LDLCALC 62 08/10/2019 10:57 AM   . Pharmacist Clinical Goal(s): o Over the next 180 days, patient will work with PharmD and providers to maintain LDL goal < 70 . Current regimen:  o Rosuvastatin 2045maily in the evening o Coenzyme Q10 100m29mily . Interventions: o Discussed appropriate goals for triglycerides (less than 150) o Discussed the importance of medication adherence . Patient self care activities - Over the next 180 days, patient will: o Take medication every day as directed  Diabetes Lab Results  Component Value Date/Time   HGBA1C 7.9 (H) 10/25/2019 10:37 AM   HGBA1C 8.1 (H) 08/10/2019 10:57 AM   . Pharmacist Clinical Goal(s): o Over the next 90 days, patient will work with PharmD and providers to achieve A1c goal <7% . Current regimen:  . Trulicity  1.5m0.9WJe weekly . Tresiba 26 units daily . Fiasp per sliding scale three times daily . Interventions: o Discussed appropriate administration of Fiasp (at the start or within 20 minutes of starting meal) o Assisting patient with obtaining FreeStyle Libre sensors through DME [employer] self care activities - Over the next 90 days, patient will: o Check blood sugar 3-4 times daily, document, and provide at future appointments o Contact provider with any episodes of hypoglycemia  Medication management . Pharmacist Clinical Goal(s): o Over the next 180 days, patient will work with PharmD and providers to maintain optimal medication adherence . Current pharmacy: [facility] . Interventions o Comprehensive medication review performed. o Continue current medication management strategy o Reviewed over the counter supplements and discussed what supplements could be stopped - Patient stopping beta-carotene,  Vitamin D3, chromium picolinate - Patient will start Vitamin C 574m daily . Patient self care activities - Over the next 180 days, patient will: o Focus on medication adherence by continued use of pill box o Take medications as prescribed o Report any questions or concerns to PharmD and/or provider(s)  Initial goal documentation       Depression Screen PHQ 2/9 Scores 03/29/2020 09/16/2019 01/26/2019 11/23/2018 09/09/2018 08/19/2018 06/29/2018  PHQ - 2 Score 0 0 2 0 0 0 0  PHQ- 9 Score - - 10 - - - -    Fall Risk Fall Risk  03/29/2020 09/16/2019 01/26/2019 01/26/2019 11/23/2018  Falls in the past year? 0 0 0 0 0  Risk for fall due to : Medication side effect - Medication side effect - -  Follow up Falls evaluation completed;Education provided;Falls prevention discussed - Falls evaluation completed;Falls prevention discussed - -    Any stairs in or around the home? Yes  If so, are there any without handrails? No  Home free of loose throw rugs in walkways, pet beds, electrical cords, etc? Yes  Adequate lighting in your home to reduce risk of falls? Yes   ASSISTIVE DEVICES UTILIZED TO PREVENT FALLS:  Life alert? No  Use of a cane, walker or w/c? No  Grab bars in the bathroom? Yes  Shower chair or bench in shower? No  Elevated toilet seat or a handicapped  toilet? No   TIMED UP AND GO:  Was the test performed? No .   Gait steady and fast without use of assistive device  Cognitive Function:     6CIT Screen 03/29/2020 01/26/2019  What Year? 0 points 0 points  What month? 0 points 0 points  What time? 0 points 0 points  Count back from 20 0 points 0 points  Months in reverse 0 points 0 points  Repeat phrase 2 points 0 points  Total Score 2 0    Immunizations Immunization History  Administered Date(s) Administered  . Fluad Quad(high Dose 65+) 03/04/2019  . Influenza, High Dose Seasonal PF 03/21/2017, 03/11/2018  . Influenza-Unspecified 03/07/2020  . PFIZER SARS-COV-2 Vaccination 07/30/2019, 08/20/2019  . Pneumococcal Conjugate-13 10/23/2016  . Zoster Recombinat (Shingrix) 01/25/2019, 03/30/2019    TDAP status: Up to date Flu Vaccine status: Up to date Pneumococcal vaccine status: Up to date Covid-19 vaccine status: Completed vaccines  Qualifies for Shingles Vaccine? Yes   Zostavax completed No   Shingrix Completed?: Yes  Screening Tests Health Maintenance  Topic Date Due  . FOOT EXAM  01/26/2020  . URINE MICROALBUMIN  03/22/2020  . HEMOGLOBIN A1C  04/26/2020  . OPHTHALMOLOGY EXAM  11/13/2020  . MAMMOGRAM  11/22/2021  . TETANUS/TDAP  12/14/2024  . COLONOSCOPY  10/11/2029  . INFLUENZA VACCINE  Completed  . DEXA SCAN  Completed  . COVID-19 Vaccine  Completed  . Hepatitis C Screening  Completed  . PNA vac Low Risk Adult  Completed    Health Maintenance  Health Maintenance Due  Topic Date Due  . FOOT EXAM  01/26/2020  . URINE MICROALBUMIN  03/22/2020    Colorectal cancer screening: Completed 10/12/2019. Repeat every 5 years Mammogram status: Completed 11/23/2019. Repeat every year Bone Density status: Completed 05/28/2010.   Lung Cancer Screening: (Low Dose CT Chest recommended if Age 69-80years, 30 pack-year currently smoking OR have quit w/in 15years.) does not qualify.   Lung Cancer Screening Referral:  no  Additional Screening:  Hepatitis C Screening: does qualify; Completed 10/25/2019  Vision Screening:  Recommended annual ophthalmology exams for early detection of glaucoma and other disorders of the eye. Is the patient up to date with their annual eye exam?  Yes  Who is the provider or what is the name of the office in which the patient attends annual eye exams? Dr. Katy Fitch If pt is not established with a provider, would they like to be referred to a provider to establish care? No .   Dental Screening: Recommended annual dental exams for proper oral hygiene  Community Resource Referral / Chronic Care Management: CRR required this visit?  No   CCM required this visit?  No      Plan:     I have personally reviewed and noted the following in the patient's chart:   . Medical and social history . Use of alcohol, tobacco or illicit drugs  . Current medications and supplements . Functional ability and status . Nutritional status . Physical activity . Advanced directives . List of other physicians . Hospitalizations, surgeries, and ER visits in previous 12 months . Vitals . Screenings to include cognitive, depression, and falls . Referrals and appointments  In addition, I have reviewed and discussed with patient certain preventive protocols, quality metrics, and best practice recommendations. A written personalized care plan for preventive services as well as general preventive health recommendations were provided to patient.     Kellie Simmering, LPN   05/28/4831   Nurse Notes:

## 2020-03-30 ENCOUNTER — Telehealth: Payer: Medicare Other

## 2020-04-01 DIAGNOSIS — Z794 Long term (current) use of insulin: Secondary | ICD-10-CM | POA: Diagnosis not present

## 2020-04-01 DIAGNOSIS — E1165 Type 2 diabetes mellitus with hyperglycemia: Secondary | ICD-10-CM | POA: Diagnosis not present

## 2020-04-02 DIAGNOSIS — G4733 Obstructive sleep apnea (adult) (pediatric): Secondary | ICD-10-CM | POA: Diagnosis not present

## 2020-04-04 ENCOUNTER — Telehealth: Payer: Medicare Other

## 2020-04-13 DIAGNOSIS — M25511 Pain in right shoulder: Secondary | ICD-10-CM | POA: Diagnosis not present

## 2020-04-13 DIAGNOSIS — M5030 Other cervical disc degeneration, unspecified cervical region: Secondary | ICD-10-CM | POA: Diagnosis not present

## 2020-04-13 DIAGNOSIS — M25561 Pain in right knee: Secondary | ICD-10-CM | POA: Diagnosis not present

## 2020-04-18 ENCOUNTER — Other Ambulatory Visit: Payer: Self-pay

## 2020-04-18 MED ORDER — ONETOUCH DELICA LANCETS 33G MISC: 0 refills | Status: DC

## 2020-04-18 MED ORDER — TRESIBA FLEXTOUCH 200 UNIT/ML ~~LOC~~ SOPN: 25.0000 [IU] | PEN_INJECTOR | Freq: Every day | SUBCUTANEOUS | 1 refills | Status: DC

## 2020-04-18 MED ORDER — BD PEN NEEDLE NANO 2ND GEN 32G X 4 MM MISC: 2 refills | Status: DC

## 2020-04-19 ENCOUNTER — Other Ambulatory Visit: Payer: Self-pay

## 2020-04-19 ENCOUNTER — Encounter: Payer: Self-pay | Admitting: Sports Medicine

## 2020-04-19 ENCOUNTER — Ambulatory Visit (INDEPENDENT_AMBULATORY_CARE_PROVIDER_SITE_OTHER): Payer: Medicare Other | Admitting: Sports Medicine

## 2020-04-19 DIAGNOSIS — E119 Type 2 diabetes mellitus without complications: Secondary | ICD-10-CM

## 2020-04-19 DIAGNOSIS — M2042 Other hammer toe(s) (acquired), left foot: Secondary | ICD-10-CM

## 2020-04-19 DIAGNOSIS — M2041 Other hammer toe(s) (acquired), right foot: Secondary | ICD-10-CM | POA: Diagnosis not present

## 2020-04-19 DIAGNOSIS — B351 Tinea unguium: Secondary | ICD-10-CM | POA: Diagnosis not present

## 2020-04-19 DIAGNOSIS — M79675 Pain in left toe(s): Secondary | ICD-10-CM | POA: Diagnosis not present

## 2020-04-19 DIAGNOSIS — M2142 Flat foot [pes planus] (acquired), left foot: Secondary | ICD-10-CM

## 2020-04-19 DIAGNOSIS — M79674 Pain in right toe(s): Secondary | ICD-10-CM | POA: Diagnosis not present

## 2020-04-19 DIAGNOSIS — M2141 Flat foot [pes planus] (acquired), right foot: Secondary | ICD-10-CM

## 2020-04-19 NOTE — Patient Instructions (Signed)
Airplus insoles from [employer] 

## 2020-04-19 NOTE — Progress Notes (Signed)
Subjective: [name] [name] is a 69 y.o. female patient with history of diabetes who presents to office today complaining of long,mildly painful nails  while ambulating in shoes; unable to trim. Patient states that the glucose reading this morning was 121 mg/dl. Patient denies any new changes in medication or new problems. Patient denies any new cramping, numbness, burning or tingling in the legs but does state that sometime she has this. No other issues noted.  Review of Systems  All other systems reviewed and are negative.    Patient Active Problem List   Diagnosis Date Noted  . Educated about COVID-19 virus infection 03/25/2020  . OSA and COPD overlap syndrome (HCC) 03/14/2019  . Snoring 02/17/2019  . Peripheral arterial disease (HCC) 01/26/2019  . Hepatitis C carrier (HCC) 01/26/2019  . Hypertensive heart and renal disease 08/19/2018  . Class 1 obesity due to excess calories with serious comorbidity and body mass index (BMI) of 33.0 to 33.9 in adult 08/19/2018  . Paresthesia of left upper extremity 06/29/2018  . Atherosclerosis of native coronary artery of native heart with angina pectoris (HCC) 04/09/2018  . Dyslipidemia 04/09/2018  . Primary osteoarthritis of both feet 11/05/2017  . Primary osteoarthritis of both hands 10/30/2017  . Primary osteoarthritis of both knees 10/30/2017  . Primary insomnia 10/30/2017  . Family history of lupus erythematosus 10/30/2017  . Dyspnea on exertion 12/10/2016  . Type 2 diabetes mellitus with stage 2 chronic kidney disease, with long-term current use of insulin (HCC) 09/21/2014  . Acute cystitis 09/21/2014  . Hyperglycemia 09/19/2014  . Bradycardia 09/19/2014  . Hyponatremia 09/19/2014  . Elevated alkaline phosphatase level 09/19/2014  . Acute kidney injury (HCC)   . Essential hypertension, benign 07/05/2008  . Coronary atherosclerosis 07/05/2008  . TOBACCO USER 07/03/2008   Current Outpatient Medications on File Prior to Visit   Medication Sig Dispense Refill  . Accu-Chek FastClix Lancets MISC CHECK BLOOD SUGAR THREE TIMES DAILY dx: e11.65 100 each 2  . amLODipine (NORVASC) 10 MG tablet TAKE 1 TABLET(10 MG) BY MOUTH DAILY 90 tablet 1  . aspirin 81 MG tablet Take 81 mg by mouth daily.    . beta carotene 95188 UNIT capsule Take 25,000 Units by mouth daily.  (Patient not taking: Reported on 03/29/2020)    . Cholecalciferol ([employer]S VIT D 5000 HIGH-POTENCY PO) Take 1 tablet by mouth daily.     . CHROMIUM PICOLINATE PO Take 200 mcg by mouth daily.     . Coenzyme Q10 (COQ-10) 100 MG CAPS Take 1 tablet by mouth daily.     . Continuous Blood Gluc Receiver (FREESTYLE LIBRE 2 READER) DEVI Use as directed to check blood sugars 3 times per day dx: e11.65 1 each 1  . Continuous Blood Gluc Sensor (FREESTYLE LIBRE 2 SENSOR) MISC Use as directed to check blood sugars 3 times per day dx: e11.65 6 each 1  . Cyanocobalamin (VITAMIN B-12 PO) Take 500 mcg by mouth.     . Flaxseed, Linseed, (FLAXSEED OIL) 1000 MG CAPS Take 1 capsule by mouth daily.    Marland Kitchen glucosamine-chondroitin 500-400 MG tablet Take 1 tablet by mouth daily. Reported on 12/17/2015    . glucose blood (ONETOUCH VERIO) test strip CHECK BLOOD SUGAR THREE TIMES DAILY dx: e11.65 150 each 11  . Ibuprofen-Acetaminophen (ADVIL DUAL ACTION PO) Take by mouth. As needed    . Insulin Aspart, w/Niacinamide, (FIASP FLEXTOUCH [place]) Inject into the skin. Use sliding scale as directed three times daily. Inject 2 units for BG 150-199,  4 units for BG 200-249, 6 units for BG 250-299, 8 units for BG 300-340, 10 units for BG>350, BG>400 call MD    . insulin degludec (TRESIBA FLEXTOUCH) 200 UNIT/ML FlexTouch Pen Inject 26 Units into the skin daily. Max dose is 80 units 9 mL 1  . Insulin Pen Needle (BD PEN NEEDLE NANO 2ND GEN) 32G X 4 MM MISC USE TO INJECT INSULIN  3 TIMES DAILY 100 each 2  . MAGNESIUM OXIDE PO Take 500 mg by mouth daily.     . metoprolol tartrate (LOPRESSOR) 50 MG tablet TAKE 1 TABLET BY  MOUTH TWICE DAILY 180 tablet 4  . Misc Natural Products (NEURIVA) CAPS Take 1 capsule by mouth daily.    Marland Kitchen nystatin (NYSTATIN) powder Apply topically 3 (three) times daily. As needed 60 g 0  . omeprazole (PRILOSEC) 20 MG capsule TAKE ONE CAPSULE BY MOUTH DAILY. 90 capsule 1  . OneTouch Delica Lancets 33G MISC USE AS DIRECTED TO CHECK BLOOD SUGAR THREE TIMES DAILY 100 each 0  . pregabalin (LYRICA) 50 MG capsule TAKE 1 CAPSULE(50 MG) BY MOUTH TWICE DAILY 180 capsule 0  . Psyllium (METAMUCIL FIBER PO) Take by mouth.    . rosuvastatin (CRESTOR) 20 MG tablet TAKE 1 TABLET(20 MG) BY MOUTH DAILY 90 tablet 1  . traZODone (DESYREL) 50 MG tablet TAKE 1 TABLET BY MOUTH EVERY NIGHT AT BEDTIME 90 tablet 1  . TRULICITY 1.5 MG/0.5ML SOPN ADMINISTER 1.5 MG UNDER THE SKIN 1 TIME A WEEK 6 mL 3  . Vitamin A 3 MG (10000 UT) TABS Take 1 tablet by mouth daily.    . vitamin C (ASCORBIC ACID) 500 MG tablet Take 1,000 mg by mouth 2 (two) times daily.     . Vitamin D, Ergocalciferol, (DRISDOL) 1.25 MG (50000 UNIT) CAPS capsule TAKE 1 CAPSULE BY MOUTH EVERY 7 DAYS 12 capsule 1  . vitamin E 600 UNIT capsule Take 400 Units by mouth daily.      No current facility-administered medications on file prior to visit.   Allergies  Allergen Reactions  . Fish Allergy     Cod  . Latex Rash    Recent Results (from the past 2160 hour(s))  CBG monitoring, ED     Status: Abnormal   Collection Time: 01/26/20  1:26 PM  Result Value Ref Range   Glucose-Capillary 102 (H) 70 - 99 mg/dL    Comment: Glucose reference range applies only to samples taken after fasting for at least 8 hours.  POCT UA - Microalbumin     Status: Normal   Collection Time: 03/29/20 11:31 AM  Result Value Ref Range   Microalbumin Ur, POC 80 mg/L   Creatinine, POC 100 mg/dL   Albumin/Creatinine Ratio, Urine, POC <30   POCT Urinalysis Dipstick (74827)     Status: Abnormal   Collection Time: 03/29/20 11:31 AM  Result Value Ref Range   Color, UA yellow     Clarity, UA cloudy    Glucose, UA Negative Negative   Bilirubin, UA negative    Ketones, UA negative    Spec Grav, UA 1.020 1.010 - 1.025   Blood, UA negative    pH, UA 7.5 5.0 - 8.0   Protein, UA Positive (A) Negative    Comment: trace   Urobilinogen, UA 0.2 0.2 or 1.0 E.U./dL   Nitrite, UA negative    Leukocytes, UA Small (1+) (A) Negative   Appearance cloudy    Odor none     Objective: General: Patient  is awake, alert, and oriented x 3 and in no acute distress.  Integument: Skin is warm, dry and supple bilateral. Nails are tender, long, thickened and  dystrophic with subungual debris, consistent with onychomycosis, 1-5 bilateral. No signs of infection. No open lesions or preulcerative lesions present bilateral. Remaining integument unremarkable.  Vasculature:  Dorsalis Pedis pulse 1/4 bilateral. Posterior Tibial pulse  1/4 bilateral.  Capillary fill time <5 sec 1-5 bilateral. Positive hair growth to the level of the digits. Temperature gradient within normal limits. No varicosities present bilateral. No edema present bilateral.   Neurology: The patient has intact sensation measured with a 5.07/10g Semmes Weinstein Monofilament at all pedal sites bilateral . Vibratory sensation diminished bilateral with tuning fork.   Musculoskeletal: Asymptomatic pes planus and hammertoe pedal deformities noted bilateral. Muscular strength 5/5 in all lower extremity muscular groups bilateral without pain on range of motion . No tenderness with calf compression bilateral.  Assessment and Plan: Problem List Items Addressed This Visit    None    Visit Diagnoses    Encounter for comprehensive diabetic foot examination, type 2 diabetes mellitus (HCC)    -  Primary   Pain due to onychomycosis of toenails of both feet          -Examined patient. -Discussed and educated patient on diabetic foot care, especially with  regards to the vascular, neurological and musculoskeletal systems.  -Stressed  the importance of good glycemic control and the detriment of not  controlling glucose levels in relation to the foot. -Mechanically debrided all nails 1-5 bilateral using sterile nail nipper and filed with dremel without incident  -Answered all patient questions -Patient to return  in 3 months for at risk foot care and Diabetic shoes with Raiford Noble meanwhile may try airplus insoles -Patient advised to call the office if any problems or questions arise in the meantime.  Asencion Islam, DPM

## 2020-04-21 DIAGNOSIS — Z794 Long term (current) use of insulin: Secondary | ICD-10-CM | POA: Diagnosis not present

## 2020-04-21 DIAGNOSIS — E119 Type 2 diabetes mellitus without complications: Secondary | ICD-10-CM | POA: Diagnosis not present

## 2020-04-30 ENCOUNTER — Telehealth: Payer: Self-pay | Admitting: Pharmacist

## 2020-04-30 NOTE — Progress Notes (Signed)
Chronic Care Management Pharmacy Assistant   Name: [name] [name]  MRN: [id number] DOB: 01/30/1951  Reason for Encounter: Medication Review - Patient Assistance Coordination.   PCP : [name], MD  Allergies:   Allergies  Allergen Reactions  . Fish Allergy     Cod  . Latex Rash    Medications: Outpatient Encounter Medications as of 04/30/2020  Medication Sig  . Accu-Chek FastClix Lancets MISC CHECK BLOOD SUGAR THREE TIMES DAILY dx: e11.65  . amLODipine (NORVASC) 10 MG tablet TAKE 1 TABLET(10 MG) BY MOUTH DAILY  . aspirin 81 MG tablet Take 81 mg by mouth daily.  . beta carotene 11941 UNIT capsule Take 25,000 Units by mouth daily.  (Patient not taking: Reported on 03/29/2020)  . Cholecalciferol (CVS VIT D 5000 HIGH-POTENCY PO) Take 1 tablet by mouth daily.   . CHROMIUM PICOLINATE PO Take 200 mcg by mouth daily.   . Coenzyme Q10 (COQ-10) 100 MG CAPS Take 1 tablet by mouth daily.   . Continuous Blood Gluc Receiver (FREESTYLE LIBRE 2 READER) DEVI Use as directed to check blood sugars 3 times per day dx: e11.65  . Continuous Blood Gluc Sensor (FREESTYLE LIBRE 2 SENSOR) MISC Use as directed to check blood sugars 3 times per day dx: e11.65  . Cyanocobalamin (VITAMIN B-12 PO) Take 500 mcg by mouth.   . Flaxseed, Linseed, (FLAXSEED OIL) 1000 MG CAPS Take 1 capsule by mouth daily.  Marland Kitchen glucosamine-chondroitin 500-400 MG tablet Take 1 tablet by mouth daily. Reported on 12/17/2015  . glucose blood (ONETOUCH VERIO) test strip CHECK BLOOD SUGAR THREE TIMES DAILY dx: e11.65  . Ibuprofen-Acetaminophen (ADVIL DUAL ACTION PO) Take by mouth. As needed  . Insulin Aspart, w/Niacinamide, (FIASP FLEXTOUCH [place]) Inject into the skin. Use sliding scale as directed three times daily. Inject 2 units for BG 150-199, 4 units for BG 200-249, 6 units for BG 250-299, 8 units for BG 300-340, 10 units for BG>350, BG>400 call MD  . insulin degludec (TRESIBA FLEXTOUCH) 200 UNIT/ML FlexTouch Pen Inject 26 Units into  the skin daily. Max dose is 80 units  . Insulin Pen Needle (BD PEN NEEDLE NANO 2ND GEN) 32G X 4 MM MISC USE TO INJECT INSULIN  3 TIMES DAILY  . MAGNESIUM OXIDE PO Take 500 mg by mouth daily.   . metoprolol tartrate (LOPRESSOR) 50 MG tablet TAKE 1 TABLET BY MOUTH TWICE DAILY  . Misc Natural Products (NEURIVA) CAPS Take 1 capsule by mouth daily.  Marland Kitchen nystatin (NYSTATIN) powder Apply topically 3 (three) times daily. As needed  . omeprazole (PRILOSEC) 20 MG capsule TAKE ONE CAPSULE BY MOUTH DAILY.  Letta Pate Delica Lancets 33G MISC USE AS DIRECTED TO CHECK BLOOD SUGAR THREE TIMES DAILY  . pregabalin (LYRICA) 50 MG capsule TAKE 1 CAPSULE(50 MG) BY MOUTH TWICE DAILY  . Psyllium (METAMUCIL FIBER PO) Take by mouth.  . rosuvastatin (CRESTOR) 20 MG tablet TAKE 1 TABLET(20 MG) BY MOUTH DAILY  . traZODone (DESYREL) 50 MG tablet TAKE 1 TABLET BY MOUTH EVERY NIGHT AT BEDTIME  . TRULICITY 1.5 MG/0.5ML SOPN ADMINISTER 1.5 MG UNDER THE SKIN 1 TIME A WEEK  . Vitamin A 3 MG (10000 UT) TABS Take 1 tablet by mouth daily.  . vitamin C (ASCORBIC ACID) 500 MG tablet Take 1,000 mg by mouth 2 (two) times daily.   . Vitamin D, Ergocalciferol, (DRISDOL) 1.25 MG (50000 UNIT) CAPS capsule TAKE 1 CAPSULE BY MOUTH EVERY 7 DAYS  . vitamin E 600 UNIT capsule Take 400  Units by mouth daily.    No facility-administered encounter medications on file as of 04/30/2020.    Current Diagnosis: Patient Active Problem List   Diagnosis Date Noted  . Educated about COVID-19 virus infection 03/25/2020  . OSA and COPD overlap syndrome (HCC) 03/14/2019  . Snoring 02/17/2019  . Peripheral arterial disease (HCC) 01/26/2019  . Hepatitis C carrier (HCC) 01/26/2019  . Hypertensive heart and renal disease 08/19/2018  . Class 1 obesity due to excess calories with serious comorbidity and body mass index (BMI) of 33.0 to 33.9 in adult 08/19/2018  . Paresthesia of left upper extremity 06/29/2018  . Atherosclerosis of native coronary artery of  native heart with angina pectoris (HCC) 04/09/2018  . Dyslipidemia 04/09/2018  . Primary osteoarthritis of both feet 11/05/2017  . Primary osteoarthritis of both hands 10/30/2017  . Primary osteoarthritis of both knees 10/30/2017  . Primary insomnia 10/30/2017  . Family history of lupus erythematosus 10/30/2017  . Dyspnea on exertion 12/10/2016  . Type 2 diabetes mellitus with stage 2 chronic kidney disease, with long-term current use of insulin (HCC) 09/21/2014  . Acute cystitis 09/21/2014  . Hyperglycemia 09/19/2014  . Bradycardia 09/19/2014  . Hyponatremia 09/19/2014  . Elevated alkaline phosphatase level 09/19/2014  . Acute kidney injury (HCC)   . Essential hypertension, benign 07/05/2008  . Coronary atherosclerosis 07/05/2008  . TOBACCO USER 07/03/2008      Follow-Up:  Patient Assistance Coordination - New Application filled out for Trulicity / Tresiba to [employer] and [employer]. Awaiting for providers signature to fax.  Willa Frater , CPP. Notified  Jon Gills, [facility] Clinical Pharmacist Assistant [phone]

## 2020-05-01 ENCOUNTER — Other Ambulatory Visit: Payer: Medicare Other | Admitting: Orthotics

## 2020-05-01 ENCOUNTER — Other Ambulatory Visit: Payer: Self-pay

## 2020-05-01 DIAGNOSIS — S46011D Strain of muscle(s) and tendon(s) of the rotator cuff of right shoulder, subsequent encounter: Secondary | ICD-10-CM | POA: Diagnosis not present

## 2020-05-01 DIAGNOSIS — M7541 Impingement syndrome of right shoulder: Secondary | ICD-10-CM | POA: Diagnosis not present

## 2020-05-02 DIAGNOSIS — E1165 Type 2 diabetes mellitus with hyperglycemia: Secondary | ICD-10-CM | POA: Diagnosis not present

## 2020-05-02 DIAGNOSIS — Z794 Long term (current) use of insulin: Secondary | ICD-10-CM | POA: Diagnosis not present

## 2020-05-03 DIAGNOSIS — G4733 Obstructive sleep apnea (adult) (pediatric): Secondary | ICD-10-CM | POA: Diagnosis not present

## 2020-05-04 ENCOUNTER — Telehealth: Payer: Self-pay

## 2020-05-04 NOTE — Telephone Encounter (Signed)
-----   Message from [name], MD sent at 04/06/2020  5:51 PM EDT ----- Did she get her labs drawn?  ----- Message ----- From: SYSTEM Sent: 04/03/2020  12:08 AM EDT To: [name], MD

## 2020-05-04 NOTE — Progress Notes (Signed)
No the pt didn't, she will come back next Monday.

## 2020-05-04 NOTE — Telephone Encounter (Signed)
The pt was notified that she didn't stop by the lab to have her lab results drawn.  The pt said she she will come back next Monday.

## 2020-05-07 ENCOUNTER — Other Ambulatory Visit: Payer: Self-pay | Admitting: Internal Medicine

## 2020-05-07 DIAGNOSIS — Z79899 Other long term (current) drug therapy: Secondary | ICD-10-CM | POA: Diagnosis not present

## 2020-05-07 DIAGNOSIS — N182 Chronic kidney disease, stage 2 (mild): Secondary | ICD-10-CM | POA: Diagnosis not present

## 2020-05-07 DIAGNOSIS — Z794 Long term (current) use of insulin: Secondary | ICD-10-CM | POA: Diagnosis not present

## 2020-05-07 DIAGNOSIS — E1122 Type 2 diabetes mellitus with diabetic chronic kidney disease: Secondary | ICD-10-CM | POA: Diagnosis not present

## 2020-05-08 ENCOUNTER — Telehealth: Payer: Self-pay

## 2020-05-08 ENCOUNTER — Other Ambulatory Visit: Payer: Self-pay

## 2020-05-08 ENCOUNTER — Ambulatory Visit: Payer: Medicare Other | Admitting: Orthotics

## 2020-05-08 ENCOUNTER — Telehealth: Payer: Medicare Other

## 2020-05-08 DIAGNOSIS — M2041 Other hammer toe(s) (acquired), right foot: Secondary | ICD-10-CM

## 2020-05-08 DIAGNOSIS — M7541 Impingement syndrome of right shoulder: Secondary | ICD-10-CM | POA: Diagnosis not present

## 2020-05-08 DIAGNOSIS — M2142 Flat foot [pes planus] (acquired), left foot: Secondary | ICD-10-CM

## 2020-05-08 DIAGNOSIS — M2141 Flat foot [pes planus] (acquired), right foot: Secondary | ICD-10-CM

## 2020-05-08 DIAGNOSIS — E119 Type 2 diabetes mellitus without complications: Secondary | ICD-10-CM

## 2020-05-08 DIAGNOSIS — S46011D Strain of muscle(s) and tendon(s) of the rotator cuff of right shoulder, subsequent encounter: Secondary | ICD-10-CM | POA: Diagnosis not present

## 2020-05-08 LAB — CBC
Hematocrit: 39 % (ref 34.0–46.6)
Hemoglobin: 12.3 g/dL (ref 11.1–15.9)
MCH: 23 pg — ABNORMAL LOW (ref 26.6–33.0)
MCHC: 31.5 g/dL (ref 31.5–35.7)
MCV: 73 fL — ABNORMAL LOW (ref 79–97)
Platelets: 268 10*3/uL (ref 150–450)
RBC: 5.34 x10E6/uL — ABNORMAL HIGH (ref 3.77–5.28)
RDW: 17.5 % — ABNORMAL HIGH (ref 11.7–15.4)
WBC: 6.3 10*3/uL (ref 3.4–10.8)

## 2020-05-08 LAB — HEMOGLOBIN A1C
Est. average glucose Bld gHb Est-mCnc: 174 mg/dL
Hgb A1c MFr Bld: 7.7 % — ABNORMAL HIGH (ref 4.8–5.6)

## 2020-05-08 LAB — CMP14+EGFR
ALT: 14 IU/L (ref 0–32)
AST: 19 IU/L (ref 0–40)
Albumin/Globulin Ratio: 1.6 (ref 1.2–2.2)
Albumin: 4.6 g/dL (ref 3.8–4.8)
Alkaline Phosphatase: 156 IU/L — ABNORMAL HIGH (ref 44–121)
BUN/Creatinine Ratio: 12 (ref 12–28)
BUN: 9 mg/dL (ref 8–27)
Bilirubin Total: 0.3 mg/dL (ref 0.0–1.2)
CO2: 26 mmol/L (ref 20–29)
Calcium: 8.8 mg/dL (ref 8.7–10.3)
Chloride: 102 mmol/L (ref 96–106)
Creatinine, Ser: 0.76 mg/dL (ref 0.57–1.00)
GFR calc Af Amer: 93 mL/min/{1.73_m2} (ref >59)
GFR calc non Af Amer: 80 mL/min/{1.73_m2} (ref >59)
Globulin, Total: 2.9 g/dL (ref 1.5–4.5)
Glucose: 76 mg/dL (ref 65–99)
Potassium: 3.4 mmol/L — ABNORMAL LOW (ref 3.5–5.2)
Sodium: 142 mmol/L (ref 134–144)
Total Protein: 7.5 g/dL (ref 6.0–8.5)

## 2020-05-08 LAB — VITAMIN B12: Vitamin B-12: 1134 pg/mL (ref 232–1245)

## 2020-05-08 NOTE — Progress Notes (Signed)

## 2020-05-08 NOTE — Telephone Encounter (Cosign Needed)
  Chronic Care Management   Outreach Note  05/08/2020 Name: [name] [name] MRN: [id number] DOB: 24-Dec-1950  Referred by: [name], MD Reason for referral : Chronic Care Management (#2 RNCM FU Call )   A second unsuccessful telephone outreach was attempted today. The patient was referred to the case management team for assistance with care management and care coordination.   Follow Up Plan:A HIPAA compliant phone message was left for the patient providing contact information and requesting a return call.  Telephone follow up appointment with care management team member scheduled for: 06/13/20  [name], RN, BSN, CCM Care Management Coordinator [facility] Care Management/[facility]  Direct Phone: [phone]

## 2020-05-09 ENCOUNTER — Telehealth: Payer: Self-pay

## 2020-05-09 NOTE — Chronic Care Management (AMB) (Signed)
Chronic Care Management [name]   Name: [name] [name]  MRN: [id number] DOB: 1951-03-14  Reason for Encounter: Patient Assistance Coordination  PCP : [name], MD  05/09/2020- Patient assistance application filled out for [name] [name] and [name] [name] [employer] for [name] [name]. Patient assistance application filled out for [employer] with [name] [name] by [name] [name], CPA. Awaiting patient and providers signatures.  [name] [name], CPP notified.   05/10/2020- Patient assistance forms signed by provider, awaiting signature from patient and income documentation to fax.    Allergies:   Allergies  Allergen Reactions  . Fish Allergy     Cod  . Latex Rash    Medications: Outpatient Encounter Medications as of 05/09/2020  Medication Sig  . Accu-Chek FastClix Lancets MISC CHECK BLOOD SUGAR THREE TIMES DAILY dx: e11.65  . amLODipine (NORVASC) 10 MG tablet TAKE 1 TABLET(10 MG) BY MOUTH DAILY  . aspirin 81 MG tablet Take 81 mg by mouth daily.  . beta carotene 12458 UNIT capsule Take 25,000 Units by mouth daily.  (Patient not taking: Reported on 03/29/2020)  . Cholecalciferol ([facility] VIT D 5000 HIGH-POTENCY PO) Take 1 tablet by mouth daily.   . CHROMIUM PICOLINATE PO Take 200 mcg by mouth daily.   . Coenzyme Q10 (COQ-10) 100 MG CAPS Take 1 tablet by mouth daily.   . Continuous Blood Gluc Receiver (FREESTYLE LIBRE 2 READER) DEVI Use as directed to check blood sugars 3 times per day dx: e11.65  . Continuous Blood Gluc Sensor (FREESTYLE LIBRE 2 SENSOR) MISC Use as directed to check blood sugars 3 times per day dx: e11.65  . Cyanocobalamin (VITAMIN B-12 PO) Take 500 mcg by mouth.   . Flaxseed, Linseed, (FLAXSEED OIL) 1000 MG CAPS Take 1 capsule by mouth daily.  Marland Kitchen glucosamine-chondroitin 500-400 MG tablet Take 1 tablet by mouth daily. Reported on 12/17/2015  . glucose blood (ONETOUCH VERIO) test strip CHECK BLOOD SUGAR THREE TIMES DAILY dx: e11.65  . Ibuprofen-Acetaminophen  (ADVIL DUAL ACTION PO) Take by mouth. As needed  . Insulin Aspart, w/Niacinamide, (FIASP FLEXTOUCH [name] [name]) Inject into the skin. Use sliding scale as directed three times daily. Inject 2 units for BG 150-199, 4 units for BG 200-249, 6 units for BG 250-299, 8 units for BG 300-340, 10 units for BG>350, BG>400 call MD  . insulin degludec (TRESIBA FLEXTOUCH) 200 UNIT/ML FlexTouch Pen Inject 26 Units into the skin daily. Max dose is 80 units  . Insulin Pen Needle (BD PEN NEEDLE NANO 2ND GEN) 32G X 4 MM MISC USE TO INJECT INSULIN  3 TIMES DAILY  . MAGNESIUM OXIDE PO Take 500 mg by mouth daily.   . metoprolol tartrate (LOPRESSOR) 50 MG tablet TAKE 1 TABLET BY MOUTH TWICE DAILY  . Misc Natural Products (NEURIVA) CAPS Take 1 capsule by mouth daily.  Marland Kitchen nystatin (NYSTATIN) powder Apply topically 3 (three) times daily. As needed  . omeprazole (PRILOSEC) 20 MG capsule TAKE ONE CAPSULE BY MOUTH DAILY.  [name] [name] Delica Lancets 33G MISC USE AS DIRECTED TO CHECK BLOOD SUGAR THREE TIMES DAILY  . pregabalin (LYRICA) 50 MG capsule TAKE 1 CAPSULE(50 MG) BY MOUTH TWICE DAILY  . Psyllium (METAMUCIL FIBER PO) Take by mouth.  . rosuvastatin (CRESTOR) 20 MG tablet TAKE 1 TABLET(20 MG) BY MOUTH DAILY  . traZODone (DESYREL) 50 MG tablet TAKE 1 TABLET BY MOUTH EVERY NIGHT AT BEDTIME  . TRULICITY 1.5 MG/0.5ML SOPN ADMINISTER 1.5 MG UNDER THE SKIN 1 TIME A WEEK  . Vitamin A 3 MG (10000  UT) TABS Take 1 tablet by mouth daily.  . vitamin C (ASCORBIC ACID) 500 MG tablet Take 1,000 mg by mouth 2 (two) times daily.   . Vitamin D, Ergocalciferol, (DRISDOL) 1.25 MG (50000 UNIT) CAPS capsule TAKE 1 CAPSULE BY MOUTH EVERY 7 DAYS  . vitamin E 600 UNIT capsule Take 400 Units by mouth daily.    No facility-administered encounter medications on file as of 05/09/2020.    Current Diagnosis: Patient Active Problem List   Diagnosis Date Noted  . Educated about COVID-19 virus infection 03/25/2020  . OSA and COPD overlap syndrome ([name] [name])  03/14/2019  . Snoring 02/17/2019  . Peripheral arterial disease ([name] [name]) 01/26/2019  . Hepatitis C carrier ([name] [name]) 01/26/2019  . Hypertensive heart and renal disease 08/19/2018  . Class 1 obesity due to excess calories with serious comorbidity and body mass index (BMI) of 33.0 to 33.9 in adult 08/19/2018  . Paresthesia of left upper extremity 06/29/2018  . Atherosclerosis of native coronary artery of native heart with angina pectoris ([name] [name]) 04/09/2018  . Dyslipidemia 04/09/2018  . Primary osteoarthritis of both feet 11/05/2017  . Primary osteoarthritis of both hands 10/30/2017  . Primary osteoarthritis of both knees 10/30/2017  . Primary insomnia 10/30/2017  . Family history of lupus erythematosus 10/30/2017  . Dyspnea on exertion 12/10/2016  . Type 2 diabetes mellitus with stage 2 chronic kidney disease, with long-term current use of insulin ([name] [name]) 09/21/2014  . Acute cystitis 09/21/2014  . Hyperglycemia 09/19/2014  . Bradycardia 09/19/2014  . Hyponatremia 09/19/2014  . Elevated alkaline phosphatase level 09/19/2014  . Acute kidney injury ([name] [name])   . Essential hypertension, benign 07/05/2008  . Coronary atherosclerosis 07/05/2008  . TOBACCO USER 07/03/2008     Follow-Up:  Patient Assistance Coordination  Will follow up with patient regarding signatures needed for Patient assistance forms, will mail if needed to get income documentation and signatures from patient to be sent to [name] [name] and [name] [name]. [name] [name], CPP notified.   [name] [name], [name] [name] [phone]

## 2020-05-14 DIAGNOSIS — S46011D Strain of muscle(s) and tendon(s) of the rotator cuff of right shoulder, subsequent encounter: Secondary | ICD-10-CM | POA: Diagnosis not present

## 2020-05-14 DIAGNOSIS — M7541 Impingement syndrome of right shoulder: Secondary | ICD-10-CM | POA: Diagnosis not present

## 2020-05-21 DIAGNOSIS — Z794 Long term (current) use of insulin: Secondary | ICD-10-CM | POA: Diagnosis not present

## 2020-05-21 DIAGNOSIS — E119 Type 2 diabetes mellitus without complications: Secondary | ICD-10-CM | POA: Diagnosis not present

## 2020-05-22 DIAGNOSIS — S46011D Strain of muscle(s) and tendon(s) of the rotator cuff of right shoulder, subsequent encounter: Secondary | ICD-10-CM | POA: Diagnosis not present

## 2020-05-22 DIAGNOSIS — M7541 Impingement syndrome of right shoulder: Secondary | ICD-10-CM | POA: Diagnosis not present

## 2020-05-24 DIAGNOSIS — S46011D Strain of muscle(s) and tendon(s) of the rotator cuff of right shoulder, subsequent encounter: Secondary | ICD-10-CM | POA: Diagnosis not present

## 2020-05-24 DIAGNOSIS — M7541 Impingement syndrome of right shoulder: Secondary | ICD-10-CM | POA: Diagnosis not present

## 2020-05-28 DIAGNOSIS — G4733 Obstructive sleep apnea (adult) (pediatric): Secondary | ICD-10-CM | POA: Diagnosis not present

## 2020-05-29 DIAGNOSIS — S46011D Strain of muscle(s) and tendon(s) of the rotator cuff of right shoulder, subsequent encounter: Secondary | ICD-10-CM | POA: Diagnosis not present

## 2020-05-29 DIAGNOSIS — M7541 Impingement syndrome of right shoulder: Secondary | ICD-10-CM | POA: Diagnosis not present

## 2020-06-01 DIAGNOSIS — E1165 Type 2 diabetes mellitus with hyperglycemia: Secondary | ICD-10-CM | POA: Diagnosis not present

## 2020-06-01 DIAGNOSIS — Z794 Long term (current) use of insulin: Secondary | ICD-10-CM | POA: Diagnosis not present

## 2020-06-02 DIAGNOSIS — G4733 Obstructive sleep apnea (adult) (pediatric): Secondary | ICD-10-CM | POA: Diagnosis not present

## 2020-06-07 ENCOUNTER — Telehealth: Payer: Self-pay

## 2020-06-07 NOTE — Telephone Encounter (Signed)
The pt said that she came last week to sign paperwork for her medication assistance and that she was unable to because the pharmacist was out of the office.  The pt said that the forms can me mailed to her or she can come back any time to sign the forms.  The pt said that if she doesn't answer that it's ok to leave a detailed message on her voicemail.

## 2020-06-12 ENCOUNTER — Telehealth: Payer: Self-pay

## 2020-06-12 NOTE — Chronic Care Management (AMB) (Signed)
Chronic Care Management Pharmacy Assistant   Name: [name] [name]  MRN: [id number] DOB: 21-May-1951  Reason for Encounter: Patient Assistance Coordination  PCP : [name], MD   06/12/2020- Patient brought income documentation and completed forms for [employer] patient assistance and [employer] patient assistance program.  Provider has signed both forms, reviewing for errors, faxing to [employer] and [employer]. Cherylin Mylar, aware.  Allergies:   Allergies  Allergen Reactions  . Fish Allergy     Cod  . Latex Rash    Medications: Outpatient Encounter Medications as of 06/12/2020  Medication Sig  . Accu-Chek FastClix Lancets MISC CHECK BLOOD SUGAR THREE TIMES DAILY dx: e11.65  . amLODipine (NORVASC) 10 MG tablet TAKE 1 TABLET(10 MG) BY MOUTH DAILY  . aspirin 81 MG tablet Take 81 mg by mouth daily.  . beta carotene 12458 UNIT capsule Take 25,000 Units by mouth daily.  (Patient not taking: Reported on 03/29/2020)  . Cholecalciferol (CVS VIT D 5000 HIGH-POTENCY PO) Take 1 tablet by mouth daily.   . CHROMIUM PICOLINATE PO Take 200 mcg by mouth daily.   . Coenzyme Q10 (COQ-10) 100 MG CAPS Take 1 tablet by mouth daily.   . Continuous Blood Gluc Receiver (FREESTYLE LIBRE 2 READER) DEVI Use as directed to check blood sugars 3 times per day dx: e11.65  . Continuous Blood Gluc Sensor (FREESTYLE LIBRE 2 SENSOR) MISC Use as directed to check blood sugars 3 times per day dx: e11.65  . Cyanocobalamin (VITAMIN B-12 PO) Take 500 mcg by mouth.   . Flaxseed, Linseed, (FLAXSEED OIL) 1000 MG CAPS Take 1 capsule by mouth daily.  Marland Kitchen glucosamine-chondroitin 500-400 MG tablet Take 1 tablet by mouth daily. Reported on 12/17/2015  . glucose blood (ONETOUCH VERIO) test strip CHECK BLOOD SUGAR THREE TIMES DAILY dx: e11.65  . Ibuprofen-Acetaminophen (ADVIL DUAL ACTION PO) Take by mouth. As needed  . Insulin Aspart, w/Niacinamide, (FIASP FLEXTOUCH [place]) Inject into the skin. Use sliding scale as  directed three times daily. Inject 2 units for BG 150-199, 4 units for BG 200-249, 6 units for BG 250-299, 8 units for BG 300-340, 10 units for BG>350, BG>400 call MD  . insulin degludec (TRESIBA FLEXTOUCH) 200 UNIT/ML FlexTouch Pen Inject 26 Units into the skin daily. Max dose is 80 units  . Insulin Pen Needle (BD PEN NEEDLE NANO 2ND GEN) 32G X 4 MM MISC USE TO INJECT INSULIN  3 TIMES DAILY  . MAGNESIUM OXIDE PO Take 500 mg by mouth daily.   . metoprolol tartrate (LOPRESSOR) 50 MG tablet TAKE 1 TABLET BY MOUTH TWICE DAILY  . Misc Natural Products (NEURIVA) CAPS Take 1 capsule by mouth daily.  Marland Kitchen nystatin (NYSTATIN) powder Apply topically 3 (three) times daily. As needed  . omeprazole (PRILOSEC) 20 MG capsule TAKE ONE CAPSULE BY MOUTH DAILY.  Letta Pate Delica Lancets 33G MISC USE AS DIRECTED TO CHECK BLOOD SUGAR THREE TIMES DAILY  . pregabalin (LYRICA) 50 MG capsule TAKE 1 CAPSULE(50 MG) BY MOUTH TWICE DAILY  . Psyllium (METAMUCIL FIBER PO) Take by mouth.  . rosuvastatin (CRESTOR) 20 MG tablet TAKE 1 TABLET(20 MG) BY MOUTH DAILY  . traZODone (DESYREL) 50 MG tablet TAKE 1 TABLET BY MOUTH EVERY NIGHT AT BEDTIME  . TRULICITY 1.5 MG/0.5ML SOPN ADMINISTER 1.5 MG UNDER THE SKIN 1 TIME A WEEK  . Vitamin A 3 MG (10000 UT) TABS Take 1 tablet by mouth daily.  . vitamin C (ASCORBIC ACID) 500 MG tablet Take 1,000 mg  by mouth 2 (two) times daily.   . Vitamin D, Ergocalciferol, (DRISDOL) 1.25 MG (50000 UNIT) CAPS capsule TAKE 1 CAPSULE BY MOUTH EVERY 7 DAYS  . vitamin E 600 UNIT capsule Take 400 Units by mouth daily.    No facility-administered encounter medications on file as of 06/12/2020.    Current Diagnosis: Patient Active Problem List   Diagnosis Date Noted  . Educated about COVID-19 virus infection 03/25/2020  . OSA and COPD overlap syndrome (HCC) 03/14/2019  . Snoring 02/17/2019  . Peripheral arterial disease (HCC) 01/26/2019  . Hepatitis C carrier (HCC) 01/26/2019  . Hypertensive heart and  renal disease 08/19/2018  . Class 1 obesity due to excess calories with serious comorbidity and body mass index (BMI) of 33.0 to 33.9 in adult 08/19/2018  . Paresthesia of left upper extremity 06/29/2018  . Atherosclerosis of native coronary artery of native heart with angina pectoris (HCC) 04/09/2018  . Dyslipidemia 04/09/2018  . Primary osteoarthritis of both feet 11/05/2017  . Primary osteoarthritis of both hands 10/30/2017  . Primary osteoarthritis of both knees 10/30/2017  . Primary insomnia 10/30/2017  . Family history of lupus erythematosus 10/30/2017  . Dyspnea on exertion 12/10/2016  . Type 2 diabetes mellitus with stage 2 chronic kidney disease, with long-term current use of insulin (HCC) 09/21/2014  . Acute cystitis 09/21/2014  . Hyperglycemia 09/19/2014  . Bradycardia 09/19/2014  . Hyponatremia 09/19/2014  . Elevated alkaline phosphatase level 09/19/2014  . Acute kidney injury (HCC)   . Essential hypertension, benign 07/05/2008  . Coronary atherosclerosis 07/05/2008  . TOBACCO USER 07/03/2008     Follow-Up:  Patient Assistance Coordination   Billee Cashing, [facility] Clinical Pharmacist Assistant [phone]

## 2020-06-13 ENCOUNTER — Telehealth: Payer: Self-pay

## 2020-06-13 ENCOUNTER — Telehealth: Payer: Medicare Other

## 2020-06-13 NOTE — Telephone Encounter (Cosign Needed)
  Chronic Care Management   Outreach Note  06/13/2020 Name: [name] [name] MRN: [id number] DOB: 06-May-1951  Referred by: [name], MD Reason for referral : Chronic Care Management (RN CM FU Call )   Faiga Stones is enrolled in a Managed Medicaid Health Plan: No  Third unsuccessful telephone outreach was attempted today. I spoke with [name] briefly today, unfortunately, she is unavailable to speak with me due to having company over. She requested a call back after the holidays. The patient was referred to the case management team for assistance with care management and care coordination.   Follow Up Plan: Telephone follow up appointment with care management team member scheduled for: 07/23/20  [name], RN, BSN, CCM Care Management Coordinator [facility] Care Management/[facility]  Direct Phone: [phone]

## 2020-06-19 ENCOUNTER — Telehealth: Payer: Self-pay

## 2020-06-19 ENCOUNTER — Ambulatory Visit: Payer: Medicare Other | Admitting: Orthotics

## 2020-06-19 ENCOUNTER — Other Ambulatory Visit: Payer: Self-pay

## 2020-06-19 DIAGNOSIS — S46011D Strain of muscle(s) and tendon(s) of the rotator cuff of right shoulder, subsequent encounter: Secondary | ICD-10-CM | POA: Diagnosis not present

## 2020-06-19 DIAGNOSIS — M7541 Impingement syndrome of right shoulder: Secondary | ICD-10-CM | POA: Diagnosis not present

## 2020-06-19 NOTE — Chronic Care Management (AMB) (Signed)
Chronic Care Management Pharmacy Assistant   Name: [name] [name]  MRN: [id number] DOB: [birth year]  Reason for Encounter: Medication Review   PCP : [name], MD  Allergies:   Allergies  Allergen Reactions  . Fish Allergy     Cod  . Latex Rash    Medications: Outpatient Encounter Medications as of 06/19/2020  Medication Sig  . Accu-Chek FastClix Lancets MISC CHECK BLOOD SUGAR THREE TIMES DAILY dx: e11.65  . amLODipine (NORVASC) 10 MG tablet TAKE 1 TABLET(10 MG) BY MOUTH DAILY  . aspirin 81 MG tablet Take 81 mg by mouth daily.  . beta carotene 46503 UNIT capsule Take 25,000 Units by mouth daily.  (Patient not taking: Reported on 03/29/2020)  . Cholecalciferol (CVS VIT D 5000 HIGH-POTENCY PO) Take 1 tablet by mouth daily.   . CHROMIUM PICOLINATE PO Take 200 mcg by mouth daily.   . Coenzyme Q10 (COQ-10) 100 MG CAPS Take 1 tablet by mouth daily.   . Continuous Blood Gluc Receiver (FREESTYLE LIBRE 2 READER) DEVI Use as directed to check blood sugars 3 times per day dx: e11.65  . Continuous Blood Gluc Sensor (FREESTYLE LIBRE 2 SENSOR) MISC Use as directed to check blood sugars 3 times per day dx: e11.65  . Cyanocobalamin (VITAMIN B-12 PO) Take 500 mcg by mouth.   . Flaxseed, Linseed, (FLAXSEED OIL) 1000 MG CAPS Take 1 capsule by mouth daily.  Marland Kitchen glucosamine-chondroitin 500-400 MG tablet Take 1 tablet by mouth daily. Reported on 12/17/2015  . glucose blood (ONETOUCH VERIO) test strip CHECK BLOOD SUGAR THREE TIMES DAILY dx: e11.65  . Ibuprofen-Acetaminophen (ADVIL DUAL ACTION PO) Take by mouth. As needed  . Insulin Aspart, w/Niacinamide, (FIASP FLEXTOUCH [place]) Inject into the skin. Use sliding scale as directed three times daily. Inject 2 units for BG 150-199, 4 units for BG 200-249, 6 units for BG 250-299, 8 units for BG 300-340, 10 units for BG>350, BG>400 call MD  . insulin degludec (TRESIBA FLEXTOUCH) 200 UNIT/ML FlexTouch Pen Inject 26 Units into the skin daily. Max dose is 80  units  . Insulin Pen Needle (BD PEN NEEDLE NANO 2ND GEN) 32G X 4 MM MISC USE TO INJECT INSULIN  3 TIMES DAILY  . MAGNESIUM OXIDE PO Take 500 mg by mouth daily.   . metoprolol tartrate (LOPRESSOR) 50 MG tablet TAKE 1 TABLET BY MOUTH TWICE DAILY  . Misc Natural Products (NEURIVA) CAPS Take 1 capsule by mouth daily.  Marland Kitchen nystatin (NYSTATIN) powder Apply topically 3 (three) times daily. As needed  . omeprazole (PRILOSEC) 20 MG capsule TAKE ONE CAPSULE BY MOUTH DAILY.  Letta Pate Delica Lancets 33G MISC USE AS DIRECTED TO CHECK BLOOD SUGAR THREE TIMES DAILY  . pregabalin (LYRICA) 50 MG capsule TAKE 1 CAPSULE(50 MG) BY MOUTH TWICE DAILY  . Psyllium (METAMUCIL FIBER PO) Take by mouth.  . rosuvastatin (CRESTOR) 20 MG tablet TAKE 1 TABLET(20 MG) BY MOUTH DAILY  . traZODone (DESYREL) 50 MG tablet TAKE 1 TABLET BY MOUTH EVERY NIGHT AT BEDTIME  . TRULICITY 1.5 MG/0.5ML SOPN ADMINISTER 1.5 MG UNDER THE SKIN 1 TIME A WEEK  . Vitamin A 3 MG (10000 UT) TABS Take 1 tablet by mouth daily.  . vitamin C (ASCORBIC ACID) 500 MG tablet Take 1,000 mg by mouth 2 (two) times daily.   . Vitamin D, Ergocalciferol, (DRISDOL) 1.25 MG (50000 UNIT) CAPS capsule TAKE 1 CAPSULE BY MOUTH EVERY 7 DAYS  . vitamin E 600 UNIT capsule Take 400 Units by mouth daily.  No facility-administered encounter medications on file as of 06/19/2020.    Current Diagnosis: Patient Active Problem List   Diagnosis Date Noted  . Educated about COVID-19 virus infection 03/25/2020  . OSA and COPD overlap syndrome (HCC) 03/14/2019  . Snoring 02/17/2019  . Peripheral arterial disease (HCC) 01/26/2019  . Hepatitis C carrier (HCC) 01/26/2019  . Hypertensive heart and renal disease 08/19/2018  . Class 1 obesity due to excess calories with serious comorbidity and body mass index (BMI) of 33.0 to 33.9 in adult 08/19/2018  . Paresthesia of left upper extremity 06/29/2018  . Atherosclerosis of native coronary artery of native heart with angina  pectoris (HCC) 04/09/2018  . Dyslipidemia 04/09/2018  . Primary osteoarthritis of both feet 11/05/2017  . Primary osteoarthritis of both hands 10/30/2017  . Primary osteoarthritis of both knees 10/30/2017  . Primary insomnia 10/30/2017  . Family history of lupus erythematosus 10/30/2017  . Dyspnea on exertion 12/10/2016  . Type 2 diabetes mellitus with stage 2 chronic kidney disease, with long-term current use of insulin (HCC) 09/21/2014  . Acute cystitis 09/21/2014  . Hyperglycemia 09/19/2014  . Bradycardia 09/19/2014  . Hyponatremia 09/19/2014  . Elevated alkaline phosphatase level 09/19/2014  . Acute kidney injury (HCC)   . Essential hypertension, benign 07/05/2008  . Coronary atherosclerosis 07/05/2008  . TOBACCO USER 07/03/2008      Follow-Up:  Pharmacist Review - Reviewed chart and adherence measures. Per Insurance data medication adherence for cholesterol 100% med compliance.  Cherylin Mylar, CPP notified  Jon Gills, [facility] Clinical Pharmacist Assistant [phone]

## 2020-06-20 DIAGNOSIS — E119 Type 2 diabetes mellitus without complications: Secondary | ICD-10-CM | POA: Diagnosis not present

## 2020-06-20 DIAGNOSIS — Z794 Long term (current) use of insulin: Secondary | ICD-10-CM | POA: Diagnosis not present

## 2020-06-22 ENCOUNTER — Other Ambulatory Visit: Payer: Self-pay | Admitting: Internal Medicine

## 2020-06-23 HISTORY — PX: CATARACT EXTRACTION W/ INTRAOCULAR LENS IMPLANT: SHX1309

## 2020-06-28 DIAGNOSIS — M1711 Unilateral primary osteoarthritis, right knee: Secondary | ICD-10-CM | POA: Diagnosis not present

## 2020-06-29 ENCOUNTER — Telehealth: Payer: Self-pay

## 2020-06-29 NOTE — Chronic Care Management (AMB) (Signed)
Chronic Care Management Pharmacy Assistant   Name: [name] [name]  MRN: [id number] DOB: 08-11-1950  Reason for Encounter: Hypertension, Hyperlipidemia, Diabetes Adherence Call  Patient Questions:  1.  Have you seen any other providers since your last visit? No  2.  Any changes in your medicines or health? No    PCP : Glendale Chard, MD  Allergies:   Allergies  Allergen Reactions   Fish Allergy     Cod   Latex Rash    Medications: Outpatient Encounter Medications as of 06/29/2020  Medication Sig   Accu-Chek FastClix Lancets MISC CHECK BLOOD SUGAR THREE TIMES DAILY dx: e11.65   amLODipine (NORVASC) 10 MG tablet TAKE 1 TABLET(10 MG) BY MOUTH DAILY   aspirin 81 MG tablet Take 81 mg by mouth daily.   beta carotene 25000 UNIT capsule Take 25,000 Units by mouth daily.  (Patient not taking: Reported on 03/29/2020)   Cholecalciferol ([employer] VIT D 5000 HIGH-POTENCY PO) Take 1 tablet by mouth daily.    CHROMIUM PICOLINATE PO Take 200 mcg by mouth daily.    Coenzyme Q10 (COQ-10) 100 MG CAPS Take 1 tablet by mouth daily.    Continuous Blood Gluc Receiver (FREESTYLE LIBRE 2 READER) DEVI Use as directed to check blood sugars 3 times per day dx: e11.65   Continuous Blood Gluc Sensor (FREESTYLE LIBRE 2 SENSOR) MISC Use as directed to check blood sugars 3 times per day dx: e11.65   Cyanocobalamin (VITAMIN B-12 PO) Take 500 mcg by mouth.    Flaxseed, Linseed, (FLAXSEED OIL) 1000 MG CAPS Take 1 capsule by mouth daily.   glucosamine-chondroitin 500-400 MG tablet Take 1 tablet by mouth daily. Reported on 12/17/2015   glucose blood (ONETOUCH VERIO) test strip CHECK BLOOD SUGAR THREE TIMES DAILY dx: e11.65   Ibuprofen-Acetaminophen (ADVIL DUAL ACTION PO) Take by mouth. As needed   Insulin Aspart, w/Niacinamide, (FIASP FLEXTOUCH [place]) Inject into the skin. Use sliding scale as directed three times daily. Inject 2 units for BG 150-199, 4 units for BG 200-249, 6 units for BG 250-299, 8 units  for BG 300-340, 10 units for BG>350, BG>400 call MD   insulin degludec (TRESIBA FLEXTOUCH) 200 UNIT/ML FlexTouch Pen Inject 26 Units into the skin daily. Max dose is 80 units   Insulin Pen Needle (BD PEN NEEDLE NANO 2ND GEN) 32G X 4 MM MISC USE TO INJECT INSULIN  3 TIMES DAILY   MAGNESIUM OXIDE PO Take 500 mg by mouth daily.    metoprolol tartrate (LOPRESSOR) 50 MG tablet TAKE 1 TABLET BY MOUTH TWICE DAILY   Misc Natural Products (NEURIVA) CAPS Take 1 capsule by mouth daily.   nystatin (NYSTATIN) powder Apply topically 3 (three) times daily. As needed   omeprazole (PRILOSEC) 20 MG capsule TAKE ONE CAPSULE BY MOUTH DAILY.   OneTouch Delica Lancets 75I MISC USE AS DIRECTED TO CHECK BLOOD SUGAR THREE TIMES DAILY   pregabalin (LYRICA) 50 MG capsule TAKE 1 CAPSULE(50 MG) BY MOUTH TWICE DAILY   Psyllium (METAMUCIL FIBER PO) Take by mouth.   rosuvastatin (CRESTOR) 20 MG tablet TAKE 1 TABLET(20 MG) BY MOUTH DAILY   traZODone (DESYREL) 50 MG tablet TAKE 1 TABLET BY MOUTH EVERY NIGHT AT BEDTIME   TRULICITY 1.5 EP/3.2RJ SOPN ADMINISTER 1.5 MG UNDER THE SKIN 1 TIME A WEEK   Vitamin A 3 MG (10000 UT) TABS Take 1 tablet by mouth daily.   vitamin C (ASCORBIC ACID) 500 MG tablet Take 1,000 mg by mouth 2 (two) times daily.  Vitamin D, Ergocalciferol, (DRISDOL) 1.25 MG (50000 UNIT) CAPS capsule TAKE 1 CAPSULE BY MOUTH EVERY 7 DAYS   vitamin E 600 UNIT capsule Take 400 Units by mouth daily.    No facility-administered encounter medications on file as of 06/29/2020.    Current Diagnosis: Patient Active Problem List   Diagnosis Date Noted   Educated about COVID-19 virus infection 03/25/2020   OSA and COPD overlap syndrome (HCC) 03/14/2019   Snoring 02/17/2019   Peripheral arterial disease (HCC) 01/26/2019   Hepatitis C carrier (HCC) 01/26/2019   Hypertensive heart and renal disease 08/19/2018   Class 1 obesity due to excess calories with serious comorbidity and body mass index (BMI)  of 33.0 to 33.9 in adult 08/19/2018   Paresthesia of left upper extremity 06/29/2018   Atherosclerosis of native coronary artery of native heart with angina pectoris (HCC) 04/09/2018   Dyslipidemia 04/09/2018   Primary osteoarthritis of both feet 11/05/2017   Primary osteoarthritis of both hands 10/30/2017   Primary osteoarthritis of both knees 10/30/2017   Primary insomnia 10/30/2017   Family history of lupus erythematosus 10/30/2017   Dyspnea on exertion 12/10/2016   Type 2 diabetes mellitus with stage 2 chronic kidney disease, with long-term current use of insulin (HCC) 09/21/2014   Acute cystitis 09/21/2014   Hyperglycemia 09/19/2014   Bradycardia 09/19/2014   Hyponatremia 09/19/2014   Elevated alkaline phosphatase level 09/19/2014   Acute kidney injury (HCC)    Essential hypertension, benign 07/05/2008   Coronary atherosclerosis 07/05/2008   TOBACCO USER 07/03/2008   Recent Relevant Labs: Lab Results  Component Value Date/Time   HGBA1C 7.7 (H) 05/07/2020 03:26 PM   HGBA1C 7.9 (H) 10/25/2019 10:37 AM   MICROALBUR 80 03/29/2020 11:31 AM   MICROALBUR 30 03/23/2019 11:44 AM    Kidney Function Lab Results  Component Value Date/Time   CREATININE 0.76 05/07/2020 03:26 PM   CREATININE 0.84 10/25/2019 10:37 AM   GFRNONAA 80 05/07/2020 03:26 PM   GFRAA 93 05/07/2020 03:26 PM     Current antihyperglycemic regimen:   Trulicity  1.5mg  once weekly  Tresiba 26 units daily (bedtime)  Fiasp per sliding scale three times daily   What recent interventions/DTPs have been made to improve glycemic control:  o Recent interventions was to check blood sugar 3-4 times daily, contact provider with any episodes of hypoglycemia.  o Patient reports she is checking blood sugars throughout the whole day with her FREESTYLE receiver. o Patient reports she is taking blood sugar medications as directed.    Have there been any recent hospitalizations or ED visits since last  visit with CPP? No    Patient denies hypoglycemic symptoms, including Pale, Sweaty, Shaky, Nervous/irritable and Vision changes. Patient reports feeling hungry more than usual when blood sugar is low.    Patient denies hyperglycemic symptoms, including blurry vision, excessive thirst, fatigue, polyuria and weakness    How often are you checking your blood sugar? 3-4 times daily    What are your blood sugars ranging?  o Fasting: 110 am; 06/29/20. o Before meals: none o After meals: none o Bedtime: 130s    During the week, how often does your blood glucose drop below 70? Patient reports blood sugar dropped to 65 during physical therapy. Patient states she ate once she arrived home.    Are you checking your feet daily/regularly? Patient states she checks feet daily with no issues to report.  Adherence Review: Is the patient currently on a STATIN medication? Yes Is the patient currently  on ACE/ARB medication? No Does the patient have >5 day gap between last estimated fill dates? No   Reviewed chart prior to disease state call. Spoke with patient regarding BP  Recent Office Vitals: BP Readings from Last 3 Encounters:  03/29/20 122/78  03/29/20 122/78  03/26/20 (!) 144/89   Pulse Readings from Last 3 Encounters:  03/29/20 70  03/29/20 70  02/02/20 72    Wt Readings from Last 3 Encounters:  03/29/20 189 lb 9.6 oz (86 kg)  03/29/20 189 lb 9.6 oz (86 kg)  03/26/20 193 lb 6.4 oz (87.7 kg)     Kidney Function Lab Results  Component Value Date/Time   CREATININE 0.76 05/07/2020 03:26 PM   CREATININE 0.84 10/25/2019 10:37 AM   GFRNONAA 80 05/07/2020 03:26 PM   GFRAA 93 05/07/2020 03:26 PM    BMP Latest Ref Rng & Units 05/07/2020 10/25/2019 08/10/2019  Glucose 65 - 99 mg/dL 76 468(E) 321(Y)  BUN 8 - 27 mg/dL 9 7(L) 11  Creatinine 2.48 - 1.00 mg/dL 2.50 0.37 0.48  BUN/Creat Ratio 12 - 28 12 8(L) 14  Sodium 134 - 144 mmol/L 142 142 141  Potassium 3.5 - 5.2 mmol/L 3.4(L) 4.4  4.1  Chloride 96 - 106 mmol/L 102 101 100  CO2 20 - 29 mmol/L 26 25 25   Calcium 8.7 - 10.3 mg/dL 8.8 9.4 9.0     Current antihypertensive regimen:   Amlodipine 10mg  daily  Metoprolol tartrate 50mg  twice daily   How often are you checking your Blood Pressure? Patient does not check at home. Patient states she was unaware she was suppose to check BP at home but will obtain a blood pressure monitor.    Current home BP readings: None. Patient reports she has no issues or concerns.   What recent interventions/DTPs have been made by any provider to improve Blood Pressure control since last CPP Visit: Recent interventions was to check BP several times monthly and if symptomatic, ensure daily salt intake < 2300 mg/day.    Any recent hospitalizations or ED visits since last visit with CPP? No    What diet changes have been made to improve Blood Pressure Control?  o Patient reports she does not add any salt to her food. Patient states she typically eats sausage, salmon, tuna, grits, 1 cup of coffee, honey nut cheerios, fresh fruit, fresh vegetables, and drink lots of water all day.   What exercise is being done to improve your Blood Pressure Control?  o Patient reports she is physically active with physical therapy for her right knee and right shoulder. Patient states she started physical therapy 2 months ago; she does PT every tuesday and thursday until July 26, 2020.   Adherence Review: Is the patient currently on ACE/ARB medication? No Does the patient have >5 day gap between last estimated fill dates? No   06/29/2020 Name: [name] [name] MRN: July 28, 2020 DOB: 22-Oct-1950 [name] [name] is a 70 y.o. year old female who is a primary care patient of 11/07/1950, MD.  Comprehensive medication review performed; Spoke to patient regarding cholesterol  Lipid Panel    Component Value Date/Time   CHOL 134 08/10/2019 1057   TRIG 169 (H) 08/10/2019 1057   HDL 44 08/10/2019 1057    LDLCALC 62 08/10/2019 1057    10-year ASCVD risk score: The 10-year ASCVD risk score 08/12/2019 DC Jr., et al., 2013) is: 16.4%   Values used to calculate the score:     Age: 68 years  Sex: Female     Is Non-Hispanic African American: Yes     Diabetic: Yes     Tobacco smoker: No     Systolic Blood Pressure: 122 mmHg     Is BP treated: Yes     HDL Cholesterol: 44 mg/dL     Total Cholesterol: 134 mg/dL   Current antihyperlipidemic regimen:  ? Rosuvastatin 20mg  daily in the evening ? Co Q10 100mg  daily   Previous antihyperlipidemic medications tried: None   ASCVD risk enhancing conditions: age >53, DM, HTN and CKD    What recent interventions/DTPs have been made by any provider to improve Cholesterol control since last CPP Visit:  - Recent interventions was to take medications as directed. - Patient reports she is taking cholesterol medications as directed.    Any recent hospitalizations or ED visits since last visit with CPP? No    What diet changes have been made to improve Cholesterol?  o Patient reports she does not add any salt to her food. Patient states she typically eats sausage, salmon, tuna, grits, 1 cup of coffee, honey nut cheerios, fresh fruit, fresh vegetables, and drink lots of water all day.   What exercise is being done to improve Cholesterol?  o Patient reports she is physically active with physical therapy for her right knee and right shoulder. Patient states she started physical therapy 2 months ago; she does PT every tuesday and thursday until July 26, 2020.    Adherence Review: Does the patient have >5 day gap between last estimated fill dates? No     Goals Addressed            This Visit's Progress    Pharmacy Care Plan   On track    CARE PLAN ENTRY (see longitudinal plan of care for additional care plan information)  Current Barriers:   Chronic Disease Management support, education, and care coordination needs related to  Hypertension, Hyperlipidemia, and Diabetes   Hypertension BP Readings from Last 3 Encounters:  10/25/19 128/76  09/12/19 122/84  08/10/19 126/82    Pharmacist Clinical Goal(s): o Over the next 180 days, patient will work with PharmD and providers to maintain BP goal <130/80  Current regimen:   Amlodipine 10mg  daily  Metoprolol tartrate 50mg  twice daily  Interventions: o Recommend patient check blood pressure a few times per month and if symptomatic  Patient self care activities - Over the next 180 days, patient will: o Check BP several times monthly and if symptomatic, document, and provide at future appointments o Ensure daily salt intake < 2300 mg/day  Hyperlipidemia Lab Results  Component Value Date/Time   LDLCALC 62 08/10/2019 10:57 AM    Pharmacist Clinical Goal(s): o Over the next 180 days, patient will work with PharmD and providers to maintain LDL goal < 70  Current regimen:  o Rosuvastatin 20mg  daily in the evening o Coenzyme Q10 100mg  daily  Interventions: o Discussed appropriate goals for triglycerides (less than 150) o Discussed the importance of medication adherence  Patient self care activities - Over the next 180 days, patient will: o Take medication every day as directed  Diabetes Lab Results  Component Value Date/Time   HGBA1C 7.9 (H) 10/25/2019 10:37 AM   HGBA1C 8.1 (H) 08/10/2019 10:57 AM    Pharmacist Clinical Goal(s): o Over the next 90 days, patient will work with PharmD and providers to achieve A1c goal <7%  Current regimen:   Trulicity  1.5mg  once weekly  Tresiba 26 units daily  Fiasp per sliding scale three times daily  Interventions: o Discussed appropriate administration of Fiasp (at the start or within 20 minutes of starting meal) o Assisting patient with obtaining FreeStyle Libre sensors through DME company  Patient self care activities - Over the next 90 days, patient will: o Check blood sugar 3-4 times daily, document,  and provide at future appointments o Contact provider with any episodes of hypoglycemia  Medication management  Pharmacist Clinical Goal(s): o Over the next 180 days, patient will work with PharmD and providers to maintain optimal medication adherence  Current pharmacy: [facility]  Interventions o Comprehensive medication review performed. o Continue current medication management strategy o Reviewed over the counter supplements and discussed what supplements could be stopped - Patient stopping beta-carotene, Vitamin D3, chromium picolinate - Patient will start Vitamin C 500mg  daily  Patient self care activities - Over the next 180 days, patient will: o Focus on medication adherence by continued use of pill box o Take medications as prescribed o Report any questions or concerns to PharmD and/or provider(s)  Initial goal documentation        Follow-Up:  Pharmacist Review-Patient reported she is taking Meloxicam 15 mg as needed for pain of Arthritis prescribed from [facility]. Patient states she started this medication in October 2021 and last fill date was 06/22/20.   Notified 06/24/20, CPP.  Cherylin Mylar, [place] [facility] Clinical Pharmacist Assistant [phone]

## 2020-07-02 DIAGNOSIS — E1165 Type 2 diabetes mellitus with hyperglycemia: Secondary | ICD-10-CM | POA: Diagnosis not present

## 2020-07-02 DIAGNOSIS — Z794 Long term (current) use of insulin: Secondary | ICD-10-CM | POA: Diagnosis not present

## 2020-07-03 DIAGNOSIS — S46011D Strain of muscle(s) and tendon(s) of the rotator cuff of right shoulder, subsequent encounter: Secondary | ICD-10-CM | POA: Diagnosis not present

## 2020-07-03 DIAGNOSIS — M7541 Impingement syndrome of right shoulder: Secondary | ICD-10-CM | POA: Diagnosis not present

## 2020-07-03 DIAGNOSIS — G4733 Obstructive sleep apnea (adult) (pediatric): Secondary | ICD-10-CM | POA: Diagnosis not present

## 2020-07-04 ENCOUNTER — Ambulatory Visit (INDEPENDENT_AMBULATORY_CARE_PROVIDER_SITE_OTHER): Payer: Medicare Other | Admitting: Internal Medicine

## 2020-07-04 ENCOUNTER — Encounter: Payer: Self-pay | Admitting: Internal Medicine

## 2020-07-04 ENCOUNTER — Other Ambulatory Visit: Payer: Self-pay

## 2020-07-04 VITALS — BP 122/80 | HR 77 | Temp 98.2°F | Ht 64.0 in | Wt 190.0 lb

## 2020-07-04 DIAGNOSIS — J449 Chronic obstructive pulmonary disease, unspecified: Secondary | ICD-10-CM

## 2020-07-04 DIAGNOSIS — B182 Chronic viral hepatitis C: Secondary | ICD-10-CM

## 2020-07-04 DIAGNOSIS — E1122 Type 2 diabetes mellitus with diabetic chronic kidney disease: Secondary | ICD-10-CM | POA: Diagnosis not present

## 2020-07-04 DIAGNOSIS — N182 Chronic kidney disease, stage 2 (mild): Secondary | ICD-10-CM | POA: Diagnosis not present

## 2020-07-04 DIAGNOSIS — I129 Hypertensive chronic kidney disease with stage 1 through stage 4 chronic kidney disease, or unspecified chronic kidney disease: Secondary | ICD-10-CM | POA: Diagnosis not present

## 2020-07-04 DIAGNOSIS — Z794 Long term (current) use of insulin: Secondary | ICD-10-CM | POA: Diagnosis not present

## 2020-07-04 DIAGNOSIS — G4733 Obstructive sleep apnea (adult) (pediatric): Secondary | ICD-10-CM

## 2020-07-04 DIAGNOSIS — I700 Atherosclerosis of aorta: Secondary | ICD-10-CM

## 2020-07-04 NOTE — Patient Instructions (Signed)
Hypertension, Adult Hypertension is another name for high blood pressure. High blood pressure forces your heart to work harder to pump blood. This can cause problems over time. There are two numbers in a blood pressure reading. There is a top number (systolic) over a bottom number (diastolic). It is best to have a blood pressure that is below 120/80. Healthy choices can help lower your blood pressure, or you may need medicine to help lower it. What are the causes? The cause of this condition is not known. Some conditions may be related to high blood pressure. What increases the risk?  Smoking.  Having type 2 diabetes mellitus, high cholesterol, or both.  Not getting enough exercise or physical activity.  Being overweight.  Having too much fat, sugar, calories, or salt (sodium) in your diet.  Drinking too much alcohol.  Having long-term (chronic) kidney disease.  Having a family history of high blood pressure.  Age. Risk increases with age.  Race. You may be at higher risk if you are African American.  Gender. Men are at higher risk than women before age 45. After age 65, women are at higher risk than men.  Having obstructive sleep apnea.  Stress. What are the signs or symptoms?  High blood pressure may not cause symptoms. Very high blood pressure (hypertensive crisis) may cause: ? Headache. ? Feelings of worry or nervousness (anxiety). ? Shortness of breath. ? Nosebleed. ? A feeling of being sick to your stomach (nausea). ? Throwing up (vomiting). ? Changes in how you see. ? Very bad chest pain. ? Seizures. How is this treated?  This condition is treated by making healthy lifestyle changes, such as: ? Eating healthy foods. ? Exercising more. ? Drinking less alcohol.  Your health care provider may prescribe medicine if lifestyle changes are not enough to get your blood pressure under control, and if: ? Your top number is above 130. ? Your bottom number is above  80.  Your personal target blood pressure may vary. Follow these instructions at home: Eating and drinking  If told, follow the DASH eating plan. To follow this plan: ? Fill one half of your plate at each meal with fruits and vegetables. ? Fill one fourth of your plate at each meal with whole grains. Whole grains include whole-wheat pasta, brown rice, and whole-grain bread. ? Eat or drink low-fat dairy products, such as skim milk or low-fat yogurt. ? Fill one fourth of your plate at each meal with low-fat (lean) proteins. Low-fat proteins include fish, chicken without skin, eggs, beans, and tofu. ? Avoid fatty meat, cured and processed meat, or chicken with skin. ? Avoid pre-made or processed food.  Eat less than 1,500 mg of salt each day.  Do not drink alcohol if: ? Your doctor tells you not to drink. ? You are pregnant, may be pregnant, or are planning to become pregnant.  If you drink alcohol: ? Limit how much you use to:  0-1 drink a day for women.  0-2 drinks a day for men. ? Be aware of how much alcohol is in your drink. In the [place] one drink equals one 12 oz bottle of beer (355 mL), one 5 oz glass of wine (148 mL), or one 1 oz glass of hard liquor (44 mL).   Lifestyle  Work with your doctor to stay at a healthy weight or to lose weight. Ask your doctor what the best weight is for you.  Get at least 30 minutes of exercise most   days of the week. This may include walking, swimming, or biking.  Get at least 30 minutes of exercise that strengthens your muscles (resistance exercise) at least 3 days a week. This may include lifting weights or doing Pilates.  Do not use any products that contain nicotine or tobacco, such as cigarettes, e-cigarettes, and chewing tobacco. If you need help quitting, ask your doctor.  Check your blood pressure at home as told by your doctor.  Keep all follow-up visits as told by your doctor. This is important.   Medicines  Take over-the-counter  and prescription medicines only as told by your doctor. Follow directions carefully.  Do not skip doses of blood pressure medicine. The medicine does not work as well if you skip doses. Skipping doses also puts you at risk for problems.  Ask your doctor about side effects or reactions to medicines that you should watch for. Contact a doctor if you:  Think you are having a reaction to the medicine you are taking.  Have headaches that keep coming back (recurring).  Feel dizzy.  Have swelling in your ankles.  Have trouble with your vision. Get help right away if you:  Get a very bad headache.  Start to feel mixed up (confused).  Feel weak or numb.  Feel faint.  Have very bad pain in your: ? Chest. ? Belly (abdomen).  Throw up more than once.  Have trouble breathing. Summary  Hypertension is another name for high blood pressure.  High blood pressure forces your heart to work harder to pump blood.  For most people, a normal blood pressure is less than 120/80.  Making healthy choices can help lower blood pressure. If your blood pressure does not get lower with healthy choices, you may need to take medicine. This information is not intended to replace advice given to you by your health care provider. Make sure you discuss any questions you have with your health care provider. Document Revised: 02/17/2018 Document Reviewed: 02/17/2018 Elsevier Patient Education  2021 Else[employer] Inc. Diabetes Mellitus and Exercise Exercising regularly is important for overall health, especially for people who have diabetes mellitus. Exercising is not only about losing weight. It has many other health benefits, such as increasing muscle strength and bone density and reducing body fat and stress. This leads to improved fitness, flexibility, and endurance, all of which result in better overall health. What are the benefits of exercise if I have diabetes? Exercise has many benefits for people with  diabetes. They include:  Helping to lower and control blood sugar (glucose).  Helping the body to respond better to the hormone insulin by improving insulin sensitivity.  Reducing how much insulin the body needs.  Lowering the risk for heart disease by: ? Lowering "bad" cholesterol and triglyceride levels. ? Increasing "good" cholesterol levels. ? Lowering blood pressure. ? Lowering blood glucose levels. What is my activity plan? Your health care provider or certified diabetes educator can help you make a plan for the type and frequency of exercise that works for you. This is called your activity plan. Be sure to:  Get at least 150 minutes of medium-intensity or high-intensity exercise each week. Exercises may include brisk walking, biking, or water aerobics.  Do stretching and strengthening exercises, such as yoga or weight lifting, at least 2 times a week.  Spread out your activity over at least 3 days of the week.  Get some form of physical activity each day. ? Do not go more than 2 days in   a row without some kind of physical activity. ? Avoid being inactive for more than 90 minutes at a time. Take frequent breaks to walk or stretch.  Choose exercises or activities that you enjoy. Set realistic goals.  Start slowly and gradually increase your exercise intensity over time.   How do I manage my diabetes during exercise? Monitor your blood glucose  Check your blood glucose before and after exercising. If your blood glucose is: ? 240 mg/dL (13.3 mmol/L) or higher before you exercise, check your urine for ketones. These are chemicals created by the liver. If you have ketones in your urine, do not exercise until your blood glucose returns to normal. ? 100 mg/dL (5.6 mmol/L) or lower, eat a snack containing 15-20 grams of carbohydrate. Check your blood glucose 15 minutes after the snack to make sure that your glucose level is above 100 mg/dL (5.6 mmol/L) before you start your  exercise.  Know the symptoms of low blood glucose (hypoglycemia) and how to treat it. Your risk for hypoglycemia increases during and after exercise. Follow these tips and your health care provider's instructions  Keep a carbohydrate snack that is fast-acting for use before, during, and after exercise to help prevent or treat hypoglycemia.  Avoid injecting insulin into areas of the body that are going to be exercised. For example, avoid injecting insulin into: ? Your arms, when you are about to play tennis. ? Your legs, when you are about to go jogging.  Keep records of your exercise habits. Doing this can help you and your health care provider adjust your diabetes management plan as needed. Write down: ? Food that you eat before and after you exercise. ? Blood glucose levels before and after you exercise. ? The type and amount of exercise you have done.  Work with your health care provider when you start a new exercise or activity. He or she may need to: ? Make sure that the activity is safe for you. ? Adjust your insulin, other medicines, and food that you eat.  Drink plenty of water while you exercise. This prevents loss of water (dehydration) and problems caused by a lot of heat in the body (heat stroke).   Where to find more information  American Diabetes Association: www.diabetes.org Summary  Exercising regularly is important for overall health, especially for people who have diabetes mellitus.  Exercising has many health benefits. It increases muscle strength and bone density and reduces body fat and stress. It also lowers and controls blood glucose.  Your health care provider or certified diabetes educator can help you make an activity plan for the type and frequency of exercise that works for you.  Work with your health care provider to make sure any new activity is safe for you. Also work with your health care provider to adjust your insulin, other medicines, and the food  you eat. This information is not intended to replace advice given to you by your health care provider. Make sure you discuss any questions you have with your health care provider. Document Revised: 03/07/2019 Document Reviewed: 03/07/2019 Elsevier Patient Education  2021 Elsevier Inc.  

## 2020-07-04 NOTE — Progress Notes (Signed)
This visit occurred during the SARS-CoV-2 public health emergency.  Safety protocols were in place, including screening questions prior to the visit, additional usage of staff PPE, and extensive cleaning of exam room while observing appropriate contact time as indicated for disinfecting solutions.  Subjective:     Patient ID: [name] [name] , female    DOB: 07-Feb-1951 , 70 y.o.   MRN: [id number]   Chief Complaint  Patient presents with  . Diabetes  . Letter for School/Work    Letter to not return to work because of (R) knee and shoulder   . Hypertension    HPI  She is here today for diabetes/BP check.  She reports compliance with meds. Denies headaches, chest pain and shortness of breath. She reports checking her sugars 4 times per day. She reports compliance with night-time insulin and sliding scale insulin. She reports her sugars are "great".   She reports she needs a note for [place]. 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 She is currently in physical therapy for right knee and right shoulder. She reports her joint pains are making it too difficult to deal with the rowdy children. She reports they are now too combative.   Diabetes She presents for her follow-up diabetic visit. She has type 2 diabetes mellitus. Her disease course has been improving. Pertinent negatives for hypoglycemia include no dizziness or headaches. Pertinent negatives for diabetes include no blurred vision, no chest pain, no fatigue, no polydipsia, no polyphagia and no polyuria. There are no hypoglycemic complications. Risk factors for coronary artery disease include diabetes mellitus, dyslipidemia, hypertension and post-menopausal. Current diabetic treatment includes insulin injections. She is compliant with treatment most of the time. She is following a diabetic diet. She participates in exercise intermittently. Eye exam is current.   Hypertension This is a chronic problem. The current episode started more than 1 year ago. The problem has been gradually improving since onset. The problem is controlled. Pertinent negatives include no blurred vision, chest pain, headaches or palpitations. Past treatments include angiotensin blockers. The current treatment provides moderate improvement. Compliance problems include exercise and diet.  Hypertensive end-organ damage includes kidney disease. Identifiable causes of hypertension include chronic renal disease.     Past Medical History:  Diagnosis Date  . Anemia   . Carpal tunnel syndrome   . Coronary artery disease    DES to ostial RCA 2005  . Diabetes mellitus without complication ([place])    No longer taking meds  . Dyslipidemia   . Hypertension   . Osteoarthritis   . PAD (peripheral artery disease) ([place])   . Sleep apnea      Family History  Problem Relation Age of Onset  . CAD Father 66  . Heart attack Father   . CAD Mother 52  . Heart attack Mother   . CAD Brother   . Cancer Brother        prostate  . CAD Sister   . Cancer Sister        breast  . CAD Brother      Current Outpatient Medications:  .  Accu-Chek FastClix Lancets MISC, CHECK BLOOD SUGAR THREE TIMES DAILY dx: e11.65, Disp: 100 each, Rfl: 2 .  amLODipine (NORVASC) 10 MG tablet, TAKE 1 TABLET(10 MG) BY MOUTH DAILY, Disp: 90 tablet, Rfl: 1 .  aspirin 81 MG tablet, Take 81 mg by mouth daily., Disp: , Rfl:  .  Cholecalciferol ([employer] VIT  D 5000 HIGH-POTENCY PO), Take 1 tablet by mouth daily. , Disp: , Rfl:  .  Coenzyme Q10 (COQ-10) 100 MG CAPS, Take 1 tablet by mouth daily. , Disp: , Rfl:  .  Continuous Blood Gluc Receiver (FREESTYLE LIBRE 2 READER) DEVI, Use as directed to check blood sugars 3 times per day dx: e11.65, Disp: 1 each, Rfl: 1 .  Continuous Blood Gluc Sensor (FREESTYLE LIBRE 2 SENSOR) MISC, Use as directed to check blood sugars 3 times per day dx: e11.65, Disp: 6 each, Rfl: 1 .  Cyanocobalamin  (VITAMIN B-12 PO), Take 500 mcg by mouth. , Disp: , Rfl:  .  glucosamine-chondroitin 500-400 MG tablet, Take 1 tablet by mouth daily. Reported on 12/17/2015, Disp: , Rfl:  .  glucose blood (ONETOUCH VERIO) test strip, CHECK BLOOD SUGAR THREE TIMES DAILY dx: e11.65, Disp: 150 each, Rfl: 11 .  Insulin Aspart, w/Niacinamide, (FIASP FLEXTOUCH [place]), Inject into the skin. Use sliding scale as directed three times daily. Inject 2 units for BG 150-199, 4 units for BG 200-249, 6 units for BG 250-299, 8 units for BG 300-340, 10 units for BG>350, BG>400 call MD, Disp: , Rfl:  .  insulin degludec (TRESIBA FLEXTOUCH) 200 UNIT/ML FlexTouch Pen, Inject 26 Units into the skin daily. Max dose is 80 units, Disp: 9 mL, Rfl: 1 .  Insulin Pen Needle (BD PEN NEEDLE NANO 2ND GEN) 32G X 4 MM MISC, USE TO INJECT INSULIN  3 TIMES DAILY, Disp: 100 each, Rfl: 2 .  MAGNESIUM OXIDE PO, Take 500 mg by mouth daily. , Disp: , Rfl:  .  metoprolol tartrate (LOPRESSOR) 50 MG tablet, TAKE 1 TABLET BY MOUTH TWICE DAILY, Disp: 180 tablet, Rfl: 4 .  Misc Natural Products (NEURIVA) CAPS, Take 1 capsule by mouth daily., Disp: , Rfl:  .  nystatin (NYSTATIN) powder, Apply topically 3 (three) times daily. As needed, Disp: 60 g, Rfl: 0 .  omeprazole (PRILOSEC) 20 MG capsule, TAKE ONE CAPSULE BY MOUTH DAILY., Disp: 90 capsule, Rfl: 1 .  OneTouch Delica Lancets 09W MISC, USE AS DIRECTED TO CHECK BLOOD SUGAR THREE TIMES DAILY, Disp: 100 each, Rfl: 0 .  pregabalin (LYRICA) 50 MG capsule, TAKE 1 CAPSULE(50 MG) BY MOUTH TWICE DAILY, Disp: 180 capsule, Rfl: 0 .  PRESCRIPTION MEDICATION, 15 mg pain medication unsure of name take as needed prescribe by orthopedic, Disp: , Rfl:  .  Psyllium (METAMUCIL FIBER PO), Take by mouth., Disp: , Rfl:  .  rosuvastatin (CRESTOR) 20 MG tablet, TAKE 1 TABLET(20 MG) BY MOUTH DAILY, Disp: 90 tablet, Rfl: 1 .  traZODone (DESYREL) 50 MG tablet, TAKE 1 TABLET BY MOUTH EVERY NIGHT AT BEDTIME, Disp: 30 tablet, Rfl: 0 .   TRULICITY 1.5 JX/9.1YN SOPN, ADMINISTER 1.5 MG UNDER THE SKIN 1 TIME A WEEK, Disp: 6 mL, Rfl: 3 .  Vitamin A 3 MG (10000 UT) TABS, Take 1 tablet by mouth daily., Disp: , Rfl:  .  vitamin C (ASCORBIC ACID) 500 MG tablet, Take 1,000 mg by mouth 2 (two) times daily. , Disp: , Rfl:  .  Vitamin D, Ergocalciferol, (DRISDOL) 1.25 MG (50000 UNIT) CAPS capsule, TAKE 1 CAPSULE BY MOUTH EVERY 7 DAYS, Disp: 12 capsule, Rfl: 1 .  vitamin E 600 UNIT capsule, Take 400 Units by mouth daily. , Disp: , Rfl:  .  Flaxseed, Linseed, (FLAXSEED OIL) 1000 MG CAPS, Take 1 capsule by mouth daily. (Patient not taking: Reported on 07/04/2020), Disp: , Rfl:    Allergies  Allergen Reactions  .  [place]  . Latex Rash     Review of Systems  Constitutional: Negative.  Negative for fatigue.  HENT: Negative.   Eyes: Negative for blurred vision.  Respiratory: Negative.   Cardiovascular: Negative.  Negative for chest pain and palpitations.  Endocrine: Negative for polydipsia, polyphagia and polyuria.  Musculoskeletal: Negative.   Skin: Negative.   Neurological: Negative for dizziness and headaches.  Psychiatric/Behavioral: Negative.      Today's Vitals   07/04/20 1105  BP: 122/80  Pulse: 77  Temp: 98.2 F (36.8 C)  TempSrc: Oral  Weight: 190 lb (86.2 kg)  Height: 5' 4" (1.626 m)  PainSc: 7   PainLoc: Knee   Body mass index is 32.61 kg/m.  Wt Readings from Last 3 Encounters:  07/04/20 190 lb (86.2 kg)  03/29/20 189 lb 9.6 oz (86 kg)  03/29/20 189 lb 9.6 oz (86 kg)   Objective:  Physical Exam Vitals and nursing note reviewed.  Constitutional:      Appearance: Normal appearance. She is obese.  HENT:     Head: Normocephalic and atraumatic.  Cardiovascular:     Rate and Rhythm: Normal rate and regular rhythm.     Heart sounds: Normal heart sounds.  Pulmonary:     Effort: Pulmonary effort is normal.     Breath sounds: Normal breath sounds.  Skin:    General: Skin is warm.  Neurological:      General: No focal deficit present.     Mental Status: She is alert.  Psychiatric:        Mood and Affect: Mood normal.        Behavior: Behavior normal.     Assessment And Plan:     1. Type 2 diabetes mellitus with stage 2 chronic kidney disease, with long-term current use of insulin (HCC) Comments: Chronic, I will check labs as listed below. She checks her sugars 4x/day. She is using basal insulin along with sliding scale. She has benefitted from CGM monitor and is happy that she can follow her sugars more closely.  - BMP8+EGFR - Hemoglobin A1c - Lipid panel - ALT  2. Parenchymal renal hypertension, stage 1 through stage 4 or unspecified chronic kidney disease Comments: Controlled. She will continue with current meds. She is advised to follow low sodium diet, less than 1528m/day. I will check non-fasting lipid panel today.  - Lipid panel - ALT  3. Atherosclerosis of aorta (HCC) Comments: Chronic, importance of statin compliance was discussed with patient. Advised to follow heart healthy lifestyle.   4. OSA and COPD overlap syndrome (HCC) Comments: Chronic. Importance of CPAP compliance was d/w patient.   5. Hepatitis C carrier ([facility]   Patient was given opportunity to ask questions. Patient verbalized understanding of the plan and was able to repeat key elements of the plan. All questions were answered to their satisfaction.  RMaximino Greenland MD   I, RMaximino Greenland MD, have reviewed all documentation for this visit. The documentation on 07/05/20 for the exam, diagnosis, procedures, and orders are all accurate and complete.  THE PATIENT IS ENCOURAGED TO PRACTICE SOCIAL DISTANCING DUE TO THE COVID-19 PANDEMIC.

## 2020-07-05 DIAGNOSIS — M1711 Unilateral primary osteoarthritis, right knee: Secondary | ICD-10-CM | POA: Diagnosis not present

## 2020-07-05 LAB — BMP8+EGFR
BUN/Creatinine Ratio: 13 (ref 12–28)
BUN: 10 mg/dL (ref 8–27)
CO2: 26 mmol/L (ref 20–29)
Calcium: 8.9 mg/dL (ref 8.7–10.3)
Chloride: 101 mmol/L (ref 96–106)
Creatinine, Ser: 0.8 mg/dL (ref 0.57–1.00)
GFR calc Af Amer: 87 mL/min/{1.73_m2} (ref >59)
GFR calc non Af Amer: 75 mL/min/{1.73_m2} (ref >59)
Glucose: 122 mg/dL — ABNORMAL HIGH (ref 65–99)
Potassium: 4.1 mmol/L (ref 3.5–5.2)
Sodium: 141 mmol/L (ref 134–144)

## 2020-07-05 LAB — ALT: ALT: 16 IU/L (ref 0–32)

## 2020-07-05 LAB — LIPID PANEL
Chol/HDL Ratio: 2.9 ratio (ref 0.0–4.4)
Cholesterol, Total: 156 mg/dL (ref 100–199)
HDL: 53 mg/dL (ref >39)
LDL Chol Calc (NIH): 86 mg/dL (ref 0–99)
Triglycerides: 90 mg/dL (ref 0–149)
VLDL Cholesterol Cal: 17 mg/dL (ref 5–40)

## 2020-07-05 LAB — HEMOGLOBIN A1C
Est. average glucose Bld gHb Est-mCnc: 177 mg/dL
Hgb A1c MFr Bld: 7.8 % — ABNORMAL HIGH (ref 4.8–5.6)

## 2020-07-11 ENCOUNTER — Encounter: Payer: Self-pay | Admitting: Internal Medicine

## 2020-07-12 DIAGNOSIS — M1711 Unilateral primary osteoarthritis, right knee: Secondary | ICD-10-CM | POA: Diagnosis not present

## 2020-07-17 DIAGNOSIS — S46011D Strain of muscle(s) and tendon(s) of the rotator cuff of right shoulder, subsequent encounter: Secondary | ICD-10-CM | POA: Diagnosis not present

## 2020-07-17 DIAGNOSIS — M7541 Impingement syndrome of right shoulder: Secondary | ICD-10-CM | POA: Diagnosis not present

## 2020-07-19 ENCOUNTER — Telehealth: Payer: Self-pay

## 2020-07-19 DIAGNOSIS — M1711 Unilateral primary osteoarthritis, right knee: Secondary | ICD-10-CM | POA: Diagnosis not present

## 2020-07-19 NOTE — Chronic Care Management (AMB) (Signed)
Chronic Care Management Pharmacy Assistant   Name: [name] [name]  MRN: [id number] DOB: 06-13-1951  Reason for Encounter: Medication Review   PCP : [name], MD  Allergies:   Allergies  Allergen Reactions   Fish Allergy     Cod   Latex Rash    Medications: Outpatient Encounter Medications as of 07/19/2020  Medication Sig   Accu-Chek FastClix Lancets MISC CHECK BLOOD SUGAR THREE TIMES DAILY dx: e11.65   amLODipine (NORVASC) 10 MG tablet TAKE 1 TABLET(10 MG) BY MOUTH DAILY   aspirin 81 MG tablet Take 81 mg by mouth daily.   Cholecalciferol (CVS VIT D 5000 HIGH-POTENCY PO) Take 1 tablet by mouth daily.    Coenzyme Q10 (COQ-10) 100 MG CAPS Take 1 tablet by mouth daily.    Continuous Blood Gluc Receiver (FREESTYLE LIBRE 2 READER) DEVI Use as directed to check blood sugars 3 times per day dx: e11.65   Continuous Blood Gluc Sensor (FREESTYLE LIBRE 2 SENSOR) MISC Use as directed to check blood sugars 3 times per day dx: e11.65   Cyanocobalamin (VITAMIN B-12 PO) Take 500 mcg by mouth.    Flaxseed, Linseed, (FLAXSEED OIL) 1000 MG CAPS Take 1 capsule by mouth daily. (Patient not taking: Reported on 07/04/2020)   glucosamine-chondroitin 500-400 MG tablet Take 1 tablet by mouth daily. Reported on 12/17/2015   glucose blood (ONETOUCH VERIO) test strip CHECK BLOOD SUGAR THREE TIMES DAILY dx: e11.65   Insulin Aspart, w/Niacinamide, (FIASP FLEXTOUCH ) Inject into the skin. Use sliding scale as directed three times daily. Inject 2 units for BG 150-199, 4 units for BG 200-249, 6 units for BG 250-299, 8 units for BG 300-340, 10 units for BG>350, BG>400 call MD   insulin degludec (TRESIBA FLEXTOUCH) 200 UNIT/ML FlexTouch Pen Inject 26 Units into the skin daily. Max dose is 80 units   Insulin Pen Needle (BD PEN NEEDLE NANO 2ND GEN) 32G X 4 MM MISC USE TO INJECT INSULIN  3 TIMES DAILY   MAGNESIUM OXIDE PO Take 500 mg by mouth daily.    metoprolol tartrate (LOPRESSOR) 50 MG  tablet TAKE 1 TABLET BY MOUTH TWICE DAILY   Misc Natural Products (NEURIVA) CAPS Take 1 capsule by mouth daily.   nystatin (NYSTATIN) powder Apply topically 3 (three) times daily. As needed   omeprazole (PRILOSEC) 20 MG capsule TAKE ONE CAPSULE BY MOUTH DAILY.   OneTouch Delica Lancets 33G MISC USE AS DIRECTED TO CHECK BLOOD SUGAR THREE TIMES DAILY   pregabalin (LYRICA) 50 MG capsule TAKE 1 CAPSULE(50 MG) BY MOUTH TWICE DAILY   PRESCRIPTION MEDICATION 15 mg pain medication unsure of name take as needed prescribe by orthopedic   Psyllium (METAMUCIL FIBER PO) Take by mouth.   rosuvastatin (CRESTOR) 20 MG tablet TAKE 1 TABLET(20 MG) BY MOUTH DAILY   traZODone (DESYREL) 50 MG tablet TAKE 1 TABLET BY MOUTH EVERY NIGHT AT BEDTIME   TRULICITY 1.5 MG/0.5ML SOPN ADMINISTER 1.5 MG UNDER THE SKIN 1 TIME A WEEK   Vitamin A 3 MG (10000 UT) TABS Take 1 tablet by mouth daily.   vitamin C (ASCORBIC ACID) 500 MG tablet Take 1,000 mg by mouth 2 (two) times daily.    Vitamin D, Ergocalciferol, (DRISDOL) 1.25 MG (50000 UNIT) CAPS capsule TAKE 1 CAPSULE BY MOUTH EVERY 7 DAYS   vitamin E 600 UNIT capsule Take 400 Units by mouth daily.    No facility-administered encounter medications on file as of 07/19/2020.    Current Diagnosis: Patient Active Problem List  Diagnosis Date Noted   Parenchymal renal hypertension 07/04/2020   Educated about COVID-19 virus infection 03/25/2020   OSA and COPD overlap syndrome (HCC) 03/14/2019   Snoring 02/17/2019   Peripheral arterial disease (HCC) 01/26/2019   Hepatitis C carrier (HCC) 01/26/2019   Hypertensive heart and renal disease 08/19/2018   Class 1 obesity due to excess calories with serious comorbidity and body mass index (BMI) of 33.0 to 33.9 in adult 08/19/2018   Paresthesia of left upper extremity 06/29/2018   Atherosclerosis of native coronary artery of native heart with angina pectoris (HCC) 04/09/2018   Dyslipidemia 04/09/2018    Primary osteoarthritis of both feet 11/05/2017   Primary osteoarthritis of both hands 10/30/2017   Primary osteoarthritis of both knees 10/30/2017   Primary insomnia 10/30/2017   Family history of lupus erythematosus 10/30/2017   Dyspnea on exertion 12/10/2016   Type 2 diabetes mellitus with stage 2 chronic kidney disease, with long-term current use of insulin (HCC) 09/21/2014   Acute cystitis 09/21/2014   Hyperglycemia 09/19/2014   Bradycardia 09/19/2014   Hyponatremia 09/19/2014   Elevated alkaline phosphatase level 09/19/2014   Acute kidney injury [facility])    Essential hypertension, benign 07/05/2008   Coronary atherosclerosis 07/05/2008   TOBACCO USER 07/03/2008     Follow-Up:  Pharmacist Review-Reviewed chart and adherence measures. Per insurance data patient is 100% med compliance for Rosuvastatin 20 MG.  Cherylin Mylar, CPP Notified.  Suezanne Cheshire, [facility] Clinical Pharmacist Assistant [phone]

## 2020-07-21 DIAGNOSIS — Z794 Long term (current) use of insulin: Secondary | ICD-10-CM | POA: Diagnosis not present

## 2020-07-21 DIAGNOSIS — E119 Type 2 diabetes mellitus without complications: Secondary | ICD-10-CM | POA: Diagnosis not present

## 2020-07-23 ENCOUNTER — Telehealth: Payer: Self-pay

## 2020-07-23 ENCOUNTER — Other Ambulatory Visit: Payer: Self-pay

## 2020-07-23 ENCOUNTER — Telehealth: Payer: Medicare Other

## 2020-07-23 MED ORDER — FIASP FLEXTOUCH 100 UNIT/ML ~~LOC~~ SOPN: PEN_INJECTOR | SUBCUTANEOUS | 1 refills | Status: DC

## 2020-07-23 NOTE — Telephone Encounter (Cosign Needed)
   07/23/2020  [name] [name] 08-04-1950 [id number]     An unsuccessful telephone outreach was attempted today. The patient was referred to the case management team for assistance with care management and care coordination.   Follow Up Plan: Telephone follow up appointment with care management team member scheduled for: 08/28/20  [name], RN, BSN, CCM Care Management Coordinator [facility] Care Management/[facility]  Direct Phone: [phone]

## 2020-07-24 ENCOUNTER — Other Ambulatory Visit: Payer: Self-pay | Admitting: Internal Medicine

## 2020-07-25 ENCOUNTER — Other Ambulatory Visit: Payer: Self-pay

## 2020-07-25 MED ORDER — FIASP FLEXTOUCH 100 UNIT/ML ~~LOC~~ SOPN: PEN_INJECTOR | SUBCUTANEOUS | 1 refills | Status: DC

## 2020-07-25 MED ORDER — BD PEN NEEDLE NANO 2ND GEN 32G X 4 MM MISC: 2 refills | Status: DC

## 2020-07-25 NOTE — Telephone Encounter (Signed)
Trazodone can you fill for [facility]

## 2020-07-26 ENCOUNTER — Ambulatory Visit: Payer: Medicare Other | Admitting: Sports Medicine

## 2020-07-26 DIAGNOSIS — M1711 Unilateral primary osteoarthritis, right knee: Secondary | ICD-10-CM | POA: Diagnosis not present

## 2020-07-26 NOTE — Telephone Encounter (Signed)
Can you send this refill for Dr Allyne Gee? thanks

## 2020-07-30 ENCOUNTER — Telehealth: Payer: Self-pay

## 2020-07-30 NOTE — Telephone Encounter (Signed)
Pt will stop by the office to pick up a sample FIASP

## 2020-08-02 ENCOUNTER — Telehealth: Payer: Self-pay

## 2020-08-02 NOTE — Chronic Care Management (AMB) (Signed)
Chronic Care Management Pharmacy Assistant   Name: Laquia Rosano  MRN: [id number] DOB: Dec 04, 1950  Reason for Encounter: Diabetes Adherence Call  Patient Questions:  1.  Have you seen any other providers since your last visit? Yes, 07/04/20-Sanders, Melina Schools, MD (OV).     2.  Any changes in your medicines or health? Yes, 07/04/20-beta carotene 11735 UNIT capsule (disc), CHROMIUM PICOLINATE PO 200 mcg (disc), Ibuprofen Acetaminophen (disc).      PCP : [name], MD  Allergies:   Allergies  Allergen Reactions   Fish Allergy     Cod   Latex Rash    Medications: Outpatient Encounter Medications as of 08/02/2020  Medication Sig   Accu-Chek FastClix Lancets MISC CHECK BLOOD SUGAR THREE TIMES DAILY dx: e11.65   amLODipine (NORVASC) 10 MG tablet TAKE 1 TABLET(10 MG) BY MOUTH DAILY   aspirin 81 MG tablet Take 81 mg by mouth daily.   Cholecalciferol (CVS VIT D 5000 HIGH-POTENCY PO) Take 1 tablet by mouth daily.    Coenzyme Q10 (COQ-10) 100 MG CAPS Take 1 tablet by mouth daily.    Continuous Blood Gluc Receiver (FREESTYLE LIBRE 2 READER) DEVI Use as directed to check blood sugars 3 times per day dx: e11.65   Continuous Blood Gluc Sensor (FREESTYLE LIBRE 2 SENSOR) MISC Use as directed to check blood sugars 3 times per day dx: e11.65   Cyanocobalamin (VITAMIN B-12 PO) Take 500 mcg by mouth.    Flaxseed, Linseed, (FLAXSEED OIL) 1000 MG CAPS Take 1 capsule by mouth daily. (Patient not taking: Reported on 07/04/2020)   glucosamine-chondroitin 500-400 MG tablet Take 1 tablet by mouth daily. Reported on 12/17/2015   glucose blood (ONETOUCH VERIO) test strip CHECK BLOOD SUGAR THREE TIMES DAILY dx: e11.65   insulin aspart (FIASP FLEXTOUCH) 100 UNIT/ML FlexTouch Pen Use sliding scale as directed three times daily. Inject 2 units for BG 150-199, 4 units for BG 200-249, 6 units for BG 250-299, 8 units for BG 300-340, 10 units for BG>350, BG>400 call MD   insulin degludec (TRESIBA  FLEXTOUCH) 200 UNIT/ML FlexTouch Pen Inject 26 Units into the skin daily. Max dose is 80 units   Insulin Pen Needle (BD PEN NEEDLE NANO 2ND GEN) 32G X 4 MM MISC USE TO INJECT INSULIN DAILY   Insulin Pen Needle (BD PEN NEEDLE NANO 2ND GEN) 32G X 4 MM MISC USE TO INJECT INSULIN  3 TIMES DAILY   MAGNESIUM OXIDE PO Take 500 mg by mouth daily.    metoprolol tartrate (LOPRESSOR) 50 MG tablet TAKE 1 TABLET BY MOUTH TWICE DAILY   Misc Natural Products (NEURIVA) CAPS Take 1 capsule by mouth daily.   nystatin (NYSTATIN) powder Apply topically 3 (three) times daily. As needed   omeprazole (PRILOSEC) 20 MG capsule TAKE ONE CAPSULE BY MOUTH DAILY.   OneTouch Delica Lancets 33G MISC USE AS DIRECTED TO CHECK BLOOD SUGAR THREE TIMES DAILY   pregabalin (LYRICA) 50 MG capsule TAKE 1 CAPSULE(50 MG) BY MOUTH TWICE DAILY   PRESCRIPTION MEDICATION 15 mg pain medication unsure of name take as needed prescribe by orthopedic   Psyllium (METAMUCIL FIBER PO) Take by mouth.   rosuvastatin (CRESTOR) 20 MG tablet TAKE 1 TABLET(20 MG) BY MOUTH DAILY   traZODone (DESYREL) 50 MG tablet TAKE 1 TABLET BY MOUTH EVERY NIGHT AT BEDTIME   TRULICITY 1.5 MG/0.5ML SOPN ADMINISTER 1.5 MG UNDER THE SKIN 1 TIME A WEEK   Vitamin A 3 MG (10000 UT) TABS Take 1 tablet by mouth daily.  vitamin C (ASCORBIC ACID) 500 MG tablet Take 1,000 mg by mouth 2 (two) times daily.    Vitamin D, Ergocalciferol, (DRISDOL) 1.25 MG (50000 UNIT) CAPS capsule TAKE 1 CAPSULE BY MOUTH EVERY 7 DAYS   vitamin E 600 UNIT capsule Take 400 Units by mouth daily.    No facility-administered encounter medications on file as of 08/02/2020.    Current Diagnosis: Patient Active Problem List   Diagnosis Date Noted   Parenchymal renal hypertension 07/04/2020   Educated about COVID-19 virus infection 03/25/2020   OSA and COPD overlap syndrome (HCC) 03/14/2019   Snoring 02/17/2019   Peripheral arterial disease (HCC) 01/26/2019   Hepatitis C  carrier (HCC) 01/26/2019   Hypertensive heart and renal disease 08/19/2018   Class 1 obesity due to excess calories with serious comorbidity and body mass index (BMI) of 33.0 to 33.9 in adult 08/19/2018   Paresthesia of left upper extremity 06/29/2018   Atherosclerosis of native coronary artery of native heart with angina pectoris (HCC) 04/09/2018   Dyslipidemia 04/09/2018   Primary osteoarthritis of both feet 11/05/2017   Primary osteoarthritis of both hands 10/30/2017   Primary osteoarthritis of both knees 10/30/2017   Primary insomnia 10/30/2017   Family history of lupus erythematosus 10/30/2017   Dyspnea on exertion 12/10/2016   Type 2 diabetes mellitus with stage 2 chronic kidney disease, with long-term current use of insulin (HCC) 09/21/2014   Acute cystitis 09/21/2014   Hyperglycemia 09/19/2014   Bradycardia 09/19/2014   Hyponatremia 09/19/2014   Elevated alkaline phosphatase level 09/19/2014   Acute kidney injury (HCC)    Essential hypertension, benign 07/05/2008   Coronary atherosclerosis 07/05/2008   TOBACCO USER 07/03/2008   Recent Relevant Labs: Lab Results  Component Value Date/Time   HGBA1C 7.8 (H) 07/04/2020 02:36 PM   HGBA1C 7.7 (H) 05/07/2020 03:26 PM   MICROALBUR 80 03/29/2020 11:31 AM   MICROALBUR 30 03/23/2019 11:44 AM    Kidney Function Lab Results  Component Value Date/Time   CREATININE 0.80 07/04/2020 02:36 PM   CREATININE 0.76 05/07/2020 03:26 PM   GFRNONAA 75 07/04/2020 02:36 PM   GFRAA 87 07/04/2020 02:36 PM     Current antihyperglycemic regimen:   Trulicity 1.5mg  once weekly  Tresiba 26 units daily (bedtime)  Fiasp per sliding scale three times daily   What recent interventions/DTPs have been made to improve glycemic control:  ? Recent interventions was to check blood sugar 3-4 times daily, contact provider with any episodes of hypoglycemia.  ? Patient reports she is checking blood sugars throughout the whole day  with her FREESTYLE receiver. ? Patient reports she is taking blood sugar medications as directed    Have there been any recent hospitalizations or ED visits since last visit with CPP? No    Patient denies hypoglycemic symptoms, including Pale, Sweaty, Shaky, Hungry, Nervous/irritable and Vision changes    Patient denies hyperglycemic symptoms, including blurry vision, excessive thirst, fatigue, polyuria and weakness    How often are you checking your blood sugar? 3-4 times daily    What are your blood sugars ranging? Per patient ranging 131 or less in the morning. o Fasting: 123, 115, 149, 136,146,147, 141 o Before meals: none o After meals: none o Bedtime: none   During the week, how often does your blood glucose drop below 70? Per patient; once or twice but unabke to remember. Patient states it dropped to 29 yesterday and two days ago from being out too long grocery shopping.     Are  you checking your feet daily/regularly? Patient states she is checking her feet daily and seen foot doctor last Thursday with nothing to report.  Adherence Review: Is the patient currently on a STATIN medication? Yes Is the patient currently on ACE/ARB medication? No Does the patient have >5 day gap between last estimated fill dates? Yes    Goals Addressed   None     Follow-Up:  Pharmacist Review-Patient voiced she picked up samples of Fiasp today around 10:30 am from Dr. Allyne Gee office. Patient stated she was told Dr. Allyne Gee will need to fill out some forms then will send to [facility] pharmacy for her to receive medications. Patient states she is unsure of the reason for this.   Cherylin Mylar, CPP Notified.  Suezanne Cheshire, [facility] Clinical Pharmacist Assistant [phone]

## 2020-08-03 ENCOUNTER — Other Ambulatory Visit: Payer: Self-pay

## 2020-08-03 NOTE — Telephone Encounter (Signed)
Prior auth done for pt's fiasp waiting on a response from the [employer].

## 2020-08-06 DIAGNOSIS — M1711 Unilateral primary osteoarthritis, right knee: Secondary | ICD-10-CM | POA: Diagnosis not present

## 2020-08-08 ENCOUNTER — Telehealth: Payer: Self-pay

## 2020-08-08 DIAGNOSIS — S46011D Strain of muscle(s) and tendon(s) of the rotator cuff of right shoulder, subsequent encounter: Secondary | ICD-10-CM | POA: Diagnosis not present

## 2020-08-08 DIAGNOSIS — M7541 Impingement syndrome of right shoulder: Secondary | ICD-10-CM | POA: Diagnosis not present

## 2020-08-08 NOTE — Telephone Encounter (Addendum)
I called the pt to let her know that her insurance is no longer covering the [place] and she will need to switch to Humalog.  Sample available for pickup.

## 2020-08-13 DIAGNOSIS — M1711 Unilateral primary osteoarthritis, right knee: Secondary | ICD-10-CM | POA: Diagnosis not present

## 2020-08-16 DIAGNOSIS — E119 Type 2 diabetes mellitus without complications: Secondary | ICD-10-CM | POA: Diagnosis not present

## 2020-08-16 DIAGNOSIS — Z794 Long term (current) use of insulin: Secondary | ICD-10-CM | POA: Diagnosis not present

## 2020-08-20 ENCOUNTER — Telehealth: Payer: Self-pay

## 2020-08-20 DIAGNOSIS — S46011D Strain of muscle(s) and tendon(s) of the rotator cuff of right shoulder, subsequent encounter: Secondary | ICD-10-CM | POA: Diagnosis not present

## 2020-08-20 DIAGNOSIS — M7541 Impingement syndrome of right shoulder: Secondary | ICD-10-CM | POA: Diagnosis not present

## 2020-08-20 NOTE — Chronic Care Management (AMB) (Signed)
Chronic Care Management Pharmacy Assistant   Name: [name] [name]  MRN: [id number] DOB: 10/08/1950  Reason for Encounter: Medication Review   PCP : Glendale Chard, MD  Allergies:   Allergies  Allergen Reactions  . [place]  . Latex Rash    Medications: Outpatient Encounter Medications as of 08/20/2020  Medication Sig  . Accu-Chek FastClix Lancets MISC CHECK BLOOD SUGAR THREE TIMES DAILY dx: e11.65  . amLODipine (NORVASC) 10 MG tablet TAKE 1 TABLET(10 MG) BY MOUTH DAILY  . aspirin 81 MG tablet Take 81 mg by mouth daily.  . Cholecalciferol (CVS VIT D 5000 HIGH-POTENCY PO) Take 1 tablet by mouth daily.   . Coenzyme Q10 (COQ-10) 100 MG CAPS Take 1 tablet by mouth daily.   . Continuous Blood Gluc Receiver (FREESTYLE LIBRE 2 READER) DEVI Use as directed to check blood sugars 3 times per day dx: e11.65  . Continuous Blood Gluc Sensor (FREESTYLE LIBRE 2 SENSOR) MISC Use as directed to check blood sugars 3 times per day dx: e11.65  . Cyanocobalamin (VITAMIN B-12 PO) Take 500 mcg by mouth.   . Flaxseed, Linseed, (FLAXSEED OIL) 1000 MG CAPS Take 1 capsule by mouth daily. (Patient not taking: Reported on 07/04/2020)  . glucosamine-chondroitin 500-400 MG tablet Take 1 tablet by mouth daily. Reported on 12/17/2015  . glucose blood (ONETOUCH VERIO) test strip CHECK BLOOD SUGAR THREE TIMES DAILY dx: e11.65  . insulin aspart (FIASP FLEXTOUCH) 100 UNIT/ML FlexTouch Pen Use sliding scale as directed three times daily. Inject 2 units for BG 150-199, 4 units for BG 200-249, 6 units for BG 250-299, 8 units for BG 300-340, 10 units for BG>350, BG>400 call MD  . insulin degludec (TRESIBA FLEXTOUCH) 200 UNIT/ML FlexTouch Pen Inject 26 Units into the skin daily. Max dose is 80 units  . Insulin Pen Needle (BD PEN NEEDLE NANO 2ND GEN) 32G X 4 MM MISC USE TO INJECT INSULIN DAILY  . Insulin Pen Needle (BD PEN NEEDLE NANO 2ND GEN) 32G X 4 MM MISC USE TO INJECT INSULIN  3 TIMES DAILY  . MAGNESIUM  OXIDE PO Take 500 mg by mouth daily.   . metoprolol tartrate (LOPRESSOR) 50 MG tablet TAKE 1 TABLET BY MOUTH TWICE DAILY  . Misc Natural Products (NEURIVA) CAPS Take 1 capsule by mouth daily.  Marland Kitchen nystatin (NYSTATIN) powder Apply topically 3 (three) times daily. As needed  . omeprazole (PRILOSEC) 20 MG capsule TAKE ONE CAPSULE BY MOUTH DAILY.  Glory Rosebush Delica Lancets 65L MISC USE AS DIRECTED TO CHECK BLOOD SUGAR THREE TIMES DAILY  . pregabalin (LYRICA) 50 MG capsule TAKE 1 CAPSULE(50 MG) BY MOUTH TWICE DAILY  . PRESCRIPTION MEDICATION 15 mg pain medication unsure of name take as needed prescribe by orthopedic  . Psyllium (METAMUCIL FIBER PO) Take by mouth.  . rosuvastatin (CRESTOR) 20 MG tablet TAKE 1 TABLET(20 MG) BY MOUTH DAILY  . traZODone (DESYREL) 50 MG tablet TAKE 1 TABLET BY MOUTH EVERY NIGHT AT BEDTIME  . TRULICITY 1.5 DJ/5.7SV SOPN ADMINISTER 1.5 MG UNDER THE SKIN 1 TIME A WEEK  . Vitamin A 3 MG (10000 UT) TABS Take 1 tablet by mouth daily.  . vitamin C (ASCORBIC ACID) 500 MG tablet Take 1,000 mg by mouth 2 (two) times daily.   . Vitamin D, Ergocalciferol, (DRISDOL) 1.25 MG (50000 UNIT) CAPS capsule TAKE 1 CAPSULE BY MOUTH EVERY 7 DAYS  . vitamin E 600 UNIT capsule Take 400 Units by mouth daily.  No facility-administered encounter medications on file as of 08/20/2020.    Current Diagnosis: Patient Active Problem List   Diagnosis Date Noted  . Parenchymal renal hypertension 07/04/2020  . Educated about COVID-19 virus infection 03/25/2020  . OSA and COPD overlap syndrome ([place]) 03/14/2019  . Snoring 02/17/2019  . Peripheral arterial disease ([place]) 01/26/2019  . Hepatitis C carrier ([place]) 01/26/2019  . Hypertensive heart and renal disease 08/19/2018  . Class 1 obesity due to excess calories with serious comorbidity and body mass index (BMI) of 33.0 to 33.9 in adult 08/19/2018  . Paresthesia of left upper extremity 06/29/2018  . Atherosclerosis of native coronary artery of native  heart with angina pectoris ([place]) 04/09/2018  . Dyslipidemia 04/09/2018  . Primary osteoarthritis of both feet 11/05/2017  . Primary osteoarthritis of both hands 10/30/2017  . Primary osteoarthritis of both knees 10/30/2017  . Primary insomnia 10/30/2017  . Family history of lupus erythematosus 10/30/2017  . Dyspnea on exertion 12/10/2016  . Type 2 diabetes mellitus with stage 2 chronic kidney disease, with long-term current use of insulin ([place]) 09/21/2014  . Acute cystitis 09/21/2014  . Hyperglycemia 09/19/2014  . Bradycardia 09/19/2014  . Hyponatremia 09/19/2014  . Elevated alkaline phosphatase level 09/19/2014  . Acute kidney injury ([place])   . Essential hypertension, benign 07/05/2008  . Coronary atherosclerosis 07/05/2008  . TOBACCO USER 07/03/2008     Follow-Up:  Pharmacist Review-Reviewed chart and adherence measures. Per insurance data Diabetes: HbA1c Control-Met-A1c less than 9.  Breast Cancer Screening Performed-Met-Results in chart. Colorectal Cancer Screening-Met-Colorectal Cancer Screening Compliant. Medication Adherence for Cholesterol-Met-Med Compliance 100%. Controlling High Blood Pressure-Met-BP < 140/90.  Orlando Penner, CPP Notified.  Raynelle Highland, [place] Pharmacist Assistant [phone] CCM Total Time: 7 minutes

## 2020-08-22 DIAGNOSIS — M1711 Unilateral primary osteoarthritis, right knee: Secondary | ICD-10-CM | POA: Diagnosis not present

## 2020-08-23 ENCOUNTER — Other Ambulatory Visit: Payer: Self-pay | Admitting: Nurse Practitioner

## 2020-08-23 ENCOUNTER — Other Ambulatory Visit: Payer: Self-pay | Admitting: Internal Medicine

## 2020-08-23 NOTE — Telephone Encounter (Signed)
Trazodone refill 

## 2020-08-27 ENCOUNTER — Other Ambulatory Visit: Payer: Self-pay | Admitting: Cardiology

## 2020-08-27 DIAGNOSIS — M1711 Unilateral primary osteoarthritis, right knee: Secondary | ICD-10-CM | POA: Diagnosis not present

## 2020-08-28 ENCOUNTER — Telehealth: Payer: Medicare Other

## 2020-08-28 ENCOUNTER — Ambulatory Visit: Payer: Self-pay

## 2020-08-28 DIAGNOSIS — E1122 Type 2 diabetes mellitus with diabetic chronic kidney disease: Secondary | ICD-10-CM

## 2020-08-28 DIAGNOSIS — I10 Essential (primary) hypertension: Secondary | ICD-10-CM

## 2020-08-28 NOTE — Chronic Care Management (AMB) (Addendum)
  Chronic Care Management   Outreach Note  08/28/2020 Name: [name] [name] MRN: [id number] DOB: September 05, 1950  Referred by: [name], MD Reason for referral : Chronic Care Management (Case Closure )   Fourth unsuccessful telephone outreach was attempted today. The patient was referred to the case management team for assistance with care management and care coordination. The patient's primary care provider has been notified of our unsuccessful attempts to make or maintain contact with the patient. The care management team is pleased to engage with this patient at any time in the future should he/she be interested in assistance from the care management team.   Follow Up Plan: We have been unable to make contact with the patient for follow up. The care management team is available to follow up with the patient after provider conversation with the patient regarding recommendation for care management engagement and subsequent re-referral to the care management team.   [name], RN, BSN, CCM Care Management Coordinator [facility] Care Management/[facility]  Direct Phone: [phone]

## 2020-08-30 ENCOUNTER — Encounter: Payer: Self-pay | Admitting: Sports Medicine

## 2020-08-30 ENCOUNTER — Other Ambulatory Visit: Payer: Self-pay

## 2020-08-30 ENCOUNTER — Ambulatory Visit (INDEPENDENT_AMBULATORY_CARE_PROVIDER_SITE_OTHER): Payer: Medicare Other | Admitting: Sports Medicine

## 2020-08-30 DIAGNOSIS — M79675 Pain in left toe(s): Secondary | ICD-10-CM | POA: Diagnosis not present

## 2020-08-30 DIAGNOSIS — B351 Tinea unguium: Secondary | ICD-10-CM | POA: Diagnosis not present

## 2020-08-30 DIAGNOSIS — M2042 Other hammer toe(s) (acquired), left foot: Secondary | ICD-10-CM

## 2020-08-30 DIAGNOSIS — M2141 Flat foot [pes planus] (acquired), right foot: Secondary | ICD-10-CM

## 2020-08-30 DIAGNOSIS — M2142 Flat foot [pes planus] (acquired), left foot: Secondary | ICD-10-CM

## 2020-08-30 DIAGNOSIS — M79674 Pain in right toe(s): Secondary | ICD-10-CM

## 2020-08-30 DIAGNOSIS — M2041 Other hammer toe(s) (acquired), right foot: Secondary | ICD-10-CM

## 2020-08-30 DIAGNOSIS — M21619 Bunion of unspecified foot: Secondary | ICD-10-CM

## 2020-08-30 NOTE — Progress Notes (Signed)
Subjective: [name] [name] is a 70 y.o. female patient with history of diabetes who presents to office today complaining of long,mildly painful nails  while ambulating in shoes; unable to trim. Patient states that the glucose reading this morning was 111 mg/dl. Patient reports that her A1c is good too. No changes since last encounter with medications. Admits that she is seeing ortho for her knee and shoulder pain.   Patient Active Problem List   Diagnosis Date Noted  . Parenchymal renal hypertension 07/04/2020  . Educated about COVID-19 virus infection 03/25/2020  . OSA and COPD overlap syndrome ([place]) 03/14/2019  . Snoring 02/17/2019  . Peripheral arterial disease ([place]) 01/26/2019  . Hepatitis C carrier ([place]) 01/26/2019  . Hypertensive heart and renal disease 08/19/2018  . Class 1 obesity due to excess calories with serious comorbidity and body mass index (BMI) of 33.0 to 33.9 in adult 08/19/2018  . Paresthesia of left upper extremity 06/29/2018  . Atherosclerosis of native coronary artery of native heart with angina pectoris ([place]) 04/09/2018  . Dyslipidemia 04/09/2018  . Primary osteoarthritis of both feet 11/05/2017  . Primary osteoarthritis of both hands 10/30/2017  . Primary osteoarthritis of both knees 10/30/2017  . Primary insomnia 10/30/2017  . Family history of lupus erythematosus 10/30/2017  . Dyspnea on exertion 12/10/2016  . Type 2 diabetes mellitus with stage 2 chronic kidney disease, with long-term current use of insulin ([place]) 09/21/2014  . Acute cystitis 09/21/2014  . Hyperglycemia 09/19/2014  . Bradycardia 09/19/2014  . Hyponatremia 09/19/2014  . Elevated alkaline phosphatase level 09/19/2014  . Acute kidney injury ([place])   . Essential hypertension, benign 07/05/2008  . Coronary atherosclerosis 07/05/2008  . TOBACCO USER 07/03/2008   Current Outpatient Medications on File Prior to Visit  Medication Sig Dispense Refill  . Accu-Chek FastClix Lancets MISC CHECK BLOOD  SUGAR THREE TIMES DAILY dx: e11.65 100 each 2  . amLODipine (NORVASC) 10 MG tablet TAKE 1 TABLET(10 MG) BY MOUTH DAILY 90 tablet 1  . aspirin 81 MG tablet Take 81 mg by mouth daily.    . Cholecalciferol (CVS VIT D 5000 HIGH-POTENCY PO) Take 1 tablet by mouth daily.     . Coenzyme Q10 (COQ-10) 100 MG CAPS Take 1 tablet by mouth daily.     . Continuous Blood Gluc Receiver (FREESTYLE LIBRE 2 READER) DEVI Use as directed to check blood sugars 3 times per day dx: e11.65 1 each 1  . Continuous Blood Gluc Sensor (FREESTYLE LIBRE 2 SENSOR) MISC Use as directed to check blood sugars 3 times per day dx: e11.65 6 each 1  . Cyanocobalamin (VITAMIN B-12 PO) Take 500 mcg by mouth.     . Flaxseed, Linseed, (FLAXSEED OIL) 1000 MG CAPS Take 1 capsule by mouth daily.    Marland Kitchen FLUZONE HIGH-DOSE QUADRIVALENT 0.7 ML SUSY     . glucosamine-chondroitin 500-400 MG tablet Take 1 tablet by mouth daily. Reported on 12/17/2015    . glucose blood (ONETOUCH VERIO) test strip CHECK BLOOD SUGAR THREE TIMES DAILY dx: e11.65 150 each 11  . insulin aspart (FIASP FLEXTOUCH) 100 UNIT/ML FlexTouch Pen Use sliding scale as directed three times daily. Inject 2 units for BG 150-199, 4 units for BG 200-249, 6 units for BG 250-299, 8 units for BG 300-340, 10 units for BG>350, BG>400 call MD 9 mL 1  . insulin degludec (TRESIBA FLEXTOUCH) 200 UNIT/ML FlexTouch Pen Inject 26 Units into the skin daily. Max dose is 80 units 9 mL 1  . Insulin Pen Needle (  BD PEN NEEDLE NANO 2ND GEN) 32G X 4 MM MISC USE TO INJECT INSULIN DAILY 100 each 2  . Insulin Pen Needle (BD PEN NEEDLE NANO 2ND GEN) 32G X 4 MM MISC USE TO INJECT INSULIN  3 TIMES DAILY 100 each 2  . MAGNESIUM OXIDE PO Take 500 mg by mouth daily.     . meloxicam (MOBIC) 15 MG tablet Take 15 mg by mouth daily as needed.    . metoprolol tartrate (LOPRESSOR) 50 MG tablet TAKE 1 TABLET BY MOUTH TWICE DAILY 180 tablet 4  . Misc Natural Products (NEURIVA) CAPS Take 1 capsule by mouth daily.    Marland Kitchen nystatin  (NYSTATIN) powder Apply topically 3 (three) times daily. As needed 60 g 0  . omeprazole (PRILOSEC) 20 MG capsule TAKE ONE CAPSULE BY MOUTH DAILY. 90 capsule 1  . OneTouch Delica Lancets 70J MISC USE AS DIRECTED TO CHECK BLOOD SUGAR THREE TIMES DAILY 100 each 0  . pregabalin (LYRICA) 50 MG capsule TAKE 1 CAPSULE(50 MG) BY MOUTH TWICE DAILY 180 capsule 0  . PRESCRIPTION MEDICATION 15 mg pain medication unsure of name take as needed prescribe by orthopedic    . Psyllium (METAMUCIL FIBER PO) Take by mouth.    . rosuvastatin (CRESTOR) 20 MG tablet TAKE 1 TABLET(20 MG) BY MOUTH DAILY 90 tablet 1  . traZODone (DESYREL) 50 MG tablet TAKE 1 TABLET BY MOUTH EVERY NIGHT AT BEDTIME 90 tablet 1  . TRULICITY 1.5 GG/8.3MO SOPN ADMINISTER 1.5 MG UNDER THE SKIN 1 TIME A WEEK 6 mL 3  . Vitamin A 3 MG (10000 UT) TABS Take 1 tablet by mouth daily.    . vitamin C (ASCORBIC ACID) 500 MG tablet Take 1,000 mg by mouth 2 (two) times daily.     . Vitamin D, Ergocalciferol, (DRISDOL) 1.25 MG (50000 UNIT) CAPS capsule TAKE 1 CAPSULE BY MOUTH EVERY 7 DAYS 12 capsule 1  . vitamin E 600 UNIT capsule Take 400 Units by mouth daily.      No current facility-administered medications on file prior to visit.   Allergies  Allergen Reactions  . [place]  . Latex Rash    Recent Results (from the past 2160 hour(s))  BMP8+EGFR     Status: Abnormal   Collection Time: 07/04/20  2:36 PM  Result Value Ref Range   Glucose 122 (H) 65 - 99 mg/dL   BUN 10 8 - 27 mg/dL   Creatinine, Ser 0.80 0.57 - 1.00 mg/dL   GFR calc non Af Amer 75 >59 mL/min/1.73   GFR calc Af Amer 87 >59 mL/min/1.73    Comment: **In accordance with recommendations from the NKF-ASN Task force,**   [facility] is in the process of updating its eGFR calculation to the   2021 CKD-EPI creatinine equation that estimates kidney function   without a race variable.    BUN/Creatinine Ratio 13 12 - 28   Sodium 141 134 - 144 mmol/L   Potassium 4.1 3.5 - 5.2  mmol/L   Chloride 101 96 - 106 mmol/L   CO2 26 20 - 29 mmol/L   Calcium 8.9 8.7 - 10.3 mg/dL  Hemoglobin A1c     Status: Abnormal   Collection Time: 07/04/20  2:36 PM  Result Value Ref Range   Hgb A1c MFr Bld 7.8 (H) 4.8 - 5.6 %    Comment:          Prediabetes: 5.7 - 6.4  Diabetes: >6.4          Glycemic control for adults with diabetes: <7.0    Est. average glucose Bld gHb Est-mCnc 177 mg/dL  Lipid panel     Status: None   Collection Time: 07/04/20  2:36 PM  Result Value Ref Range   Cholesterol, Total 156 100 - 199 mg/dL   Triglycerides 90 0 - 149 mg/dL   HDL 53 >39 mg/dL   VLDL Cholesterol Cal 17 5 - 40 mg/dL   LDL Chol Calc (NIH) 86 0 - 99 mg/dL   Chol/HDL Ratio 2.9 0.0 - 4.4 ratio    Comment:                                   T. Chol/HDL Ratio                                             Men  Women                               1/2 Avg.Risk  3.4    3.3                                   Avg.Risk  5.0    4.4                                2X Avg.Risk  9.6    7.1                                3X Avg.Risk 23.4   11.0   ALT     Status: None   Collection Time: 07/04/20  2:36 PM  Result Value Ref Range   ALT 16 0 - 32 IU/L    Objective: General: Patient is awake, alert, and oriented x 3 and in no acute distress.  Integument: Skin is warm, dry and supple bilateral. Nails are tender, long, thickened and dystrophic with subungual debris, consistent with onychomycosis, 1-5 bilateral. No signs of infection. No open lesions or preulcerative lesions present bilateral. Remaining integument unremarkable.  Vasculature:  Dorsalis Pedis pulse 1/4 bilateral. Posterior Tibial pulse  1/4 bilateral.  Capillary fill time <5 sec 1-5 bilateral. Positive hair growth to the level of the digits. Temperature gradient within normal limits. No varicosities present bilateral. No edema present bilateral.   Neurology: The patient has intact sensation measured with a 5.07/10g Semmes Weinstein  Monofilament at all pedal sites bilateral . Vibratory sensation diminished bilateral with tuning fork.   Musculoskeletal: Asymptomatic  Right bunion, pes planus and hammertoe pedal deformities noted bilateral. Muscular strength 5/5 in all lower extremity muscular groups bilateral without pain on range of motion . No tenderness with calf compression bilateral.  Assessment and Plan: Problem List Items Addressed This Visit   None   Visit Diagnoses    Pain due to onychomycosis of toenails of both feet    -  Primary   Relevant Medications   FLUZONE HIGH-DOSE QUADRIVALENT 0.7 ML SUSY   Hammer toes of both feet  Pes planus of both feet       Bunion          -Examined patient. -Re-Discussed and educated patient on diabetic foot care, especially with  regards to the vascular, neurological and musculoskeletal systems.  -Mechanically debrided all nails 1-5 bilateral using sterile nail nipper and filed with dremel without incident  -Continue with diabetic shoes  -Answered all patient questions -Patient advised to call the office if any problems or questions arise in the meantime.  Landis Martins, DPM

## 2020-09-01 ENCOUNTER — Other Ambulatory Visit: Payer: Self-pay | Admitting: Internal Medicine

## 2020-09-07 DIAGNOSIS — M1711 Unilateral primary osteoarthritis, right knee: Secondary | ICD-10-CM | POA: Diagnosis not present

## 2020-09-11 ENCOUNTER — Other Ambulatory Visit: Payer: Self-pay | Admitting: Internal Medicine

## 2020-09-11 DIAGNOSIS — M1711 Unilateral primary osteoarthritis, right knee: Secondary | ICD-10-CM | POA: Diagnosis not present

## 2020-09-13 DIAGNOSIS — Z794 Long term (current) use of insulin: Secondary | ICD-10-CM | POA: Diagnosis not present

## 2020-09-13 DIAGNOSIS — E119 Type 2 diabetes mellitus without complications: Secondary | ICD-10-CM | POA: Diagnosis not present

## 2020-09-18 DIAGNOSIS — H905 Unspecified sensorineural hearing loss: Secondary | ICD-10-CM | POA: Diagnosis not present

## 2020-09-27 ENCOUNTER — Telehealth: Payer: Self-pay

## 2020-09-27 ENCOUNTER — Ambulatory Visit (INDEPENDENT_AMBULATORY_CARE_PROVIDER_SITE_OTHER): Payer: Medicare Other | Admitting: Internal Medicine

## 2020-09-27 ENCOUNTER — Other Ambulatory Visit: Payer: Self-pay

## 2020-09-27 ENCOUNTER — Encounter: Payer: Self-pay | Admitting: Internal Medicine

## 2020-09-27 VITALS — BP 132/80 | HR 69 | Temp 98.2°F | Ht 64.0 in | Wt 190.0 lb

## 2020-09-27 DIAGNOSIS — E6609 Other obesity due to excess calories: Secondary | ICD-10-CM

## 2020-09-27 DIAGNOSIS — Z1231 Encounter for screening mammogram for malignant neoplasm of breast: Secondary | ICD-10-CM

## 2020-09-27 DIAGNOSIS — Z794 Long term (current) use of insulin: Secondary | ICD-10-CM | POA: Diagnosis not present

## 2020-09-27 DIAGNOSIS — I129 Hypertensive chronic kidney disease with stage 1 through stage 4 chronic kidney disease, or unspecified chronic kidney disease: Secondary | ICD-10-CM | POA: Diagnosis not present

## 2020-09-27 DIAGNOSIS — Z6832 Body mass index (BMI) 32.0-32.9, adult: Secondary | ICD-10-CM

## 2020-09-27 DIAGNOSIS — E1122 Type 2 diabetes mellitus with diabetic chronic kidney disease: Secondary | ICD-10-CM

## 2020-09-27 DIAGNOSIS — N182 Chronic kidney disease, stage 2 (mild): Secondary | ICD-10-CM

## 2020-09-27 DIAGNOSIS — I25119 Atherosclerotic heart disease of native coronary artery with unspecified angina pectoris: Secondary | ICD-10-CM

## 2020-09-27 MED ORDER — BD PEN NEEDLE NANO 2ND GEN 32G X 4 MM MISC: 2 refills | Status: DC

## 2020-09-27 MED ORDER — PREGABALIN 50 MG PO CAPS: ORAL_CAPSULE | ORAL | 2 refills | Status: DC

## 2020-09-27 NOTE — Patient Instructions (Signed)
Diabetes Mellitus and Foot Care Foot care is an important part of your health, especially when you have diabetes. Diabetes may cause you to have problems because of poor blood flow (circulation) to your feet and legs, which can cause your skin to:  Become thinner and drier.  Break more easily.  Heal more slowly.  Peel and crack. You may also have nerve damage (neuropathy) in your legs and feet, causing decreased feeling in them. This means that you may not notice minor injuries to your feet that could lead to more serious problems. Noticing and addressing any potential problems early is the best way to prevent future foot problems. How to care for your feet Foot hygiene  Wash your feet daily with warm water and mild soap. Do not use hot water. Then, pat your feet and the areas between your toes until they are completely dry. Do not soak your feet as this can dry your skin.  Trim your toenails straight across. Do not dig under them or around the cuticle. File the edges of your nails with an emery board or nail file.  Apply a moisturizing lotion or petroleum jelly to the skin on your feet and to dry, brittle toenails. Use lotion that does not contain alcohol and is unscented. Do not apply lotion between your toes.   Shoes and socks  Wear clean socks or stockings every day. Make sure they are not too tight. Do not wear knee-high stockings since they may decrease blood flow to your legs.  Wear shoes that fit properly and have enough cushioning. Always look in your shoes before you put them on to be sure there are no objects inside.  To break in new shoes, wear them for just a few hours a day. This prevents injuries on your feet. Wounds, scrapes, corns, and calluses  Check your feet daily for blisters, cuts, bruises, sores, and redness. If you cannot see the bottom of your feet, use a mirror or ask someone for help.  Do not cut corns or calluses or try to remove them with medicine.  If you  find a minor scrape, cut, or break in the skin on your feet, keep it and the skin around it clean and dry. You may clean these areas with mild soap and water. Do not clean the area with peroxide, alcohol, or iodine.  If you have a wound, scrape, corn, or callus on your foot, look at it several times a day to make sure it is healing and not infected. Check for: ? Redness, swelling, or pain. ? Fluid or blood. ? Warmth. ? Pus or a bad smell.   General tips  Do not cross your legs. This may decrease blood flow to your feet.  Do not use heating pads or hot water bottles on your feet. They may burn your skin. If you have lost feeling in your feet or legs, you may not know this is happening until it is too late.  Protect your feet from hot and cold by wearing shoes, such as at the beach or on hot pavement.  Schedule a complete foot exam at least once a year (annually) or more often if you have foot problems. Report any cuts, sores, or bruises to your health care provider immediately. Where to find more information  American Diabetes Association: www.diabetes.org  Association of Diabetes Care & Education Specialists: www.diabeteseducator.org Contact a health care provider if:  You have a medical condition that increases your risk of infection and   you have any cuts, sores, or bruises on your feet.  You have an injury that is not healing.  You have redness on your legs or feet.  You feel burning or tingling in your legs or feet.  You have pain or cramps in your legs and feet.  Your legs or feet are numb.  Your feet always feel cold.  You have pain around any toenails. Get help right away if:  You have a wound, scrape, corn, or callus on your foot and: ? You have pain, swelling, or redness that gets worse. ? You have fluid or blood coming from the wound, scrape, corn, or callus. ? Your wound, scrape, corn, or callus feels warm to the touch. ? You have pus or a bad smell coming from  the wound, scrape, corn, or callus. ? You have a fever. ? You have a red line going up your leg. Summary  Check your feet every day for blisters, cuts, bruises, sores, and redness.  Apply a moisturizing lotion or petroleum jelly to the skin on your feet and to dry, brittle toenails.  Wear shoes that fit properly and have enough cushioning.  If you have foot problems, report any cuts, sores, or bruises to your health care provider immediately.  Schedule a complete foot exam at least once a year (annually) or more often if you have foot problems. This information is not intended to replace advice given to you by your health care provider. Make sure you discuss any questions you have with your health care provider. Document Revised: 12/29/2019 Document Reviewed: 12/29/2019 Elsevier Patient Education  2021 Elsevier Inc.  

## 2020-09-27 NOTE — Progress Notes (Signed)
I,Katawbba Wiggins,acting as a Education administrator for [name], MD.,have documented all relevant documentation on the behalf of [name], MD,as directed by  [name], MD while in the presence of [name], MD.  This visit occurred during the SARS-CoV-2 public health emergency.  Safety protocols were in place, including screening questions prior to the visit, additional usage of staff PPE, and extensive cleaning of exam room while observing appropriate contact time as indicated for disinfecting solutions.  Subjective:     Patient ID: [name] [name] , female    DOB: April 01, 1951 , 70 y.o.   MRN: [id number]   Chief Complaint  Patient presents with  . Diabetes  . Hypertension    HPI  She is here today for diabetes/BP check.  She reports compliance with meds. She denies having any headaches, chest pain and shortness of breath.  She admits that she is not getting enough exercise.   Diabetes She presents for her follow-up diabetic visit. She has type 2 diabetes mellitus. Her disease course has been improving. Pertinent negatives for hypoglycemia include no dizziness or headaches. Pertinent negatives for diabetes include no blurred vision, no chest pain, no fatigue, no polydipsia, no polyphagia and no polyuria. There are no hypoglycemic complications. Risk factors for coronary artery disease include diabetes mellitus, dyslipidemia, hypertension and post-menopausal. Current diabetic treatment includes insulin injections. She is compliant with treatment most of the time. She is following a diabetic diet. She participates in exercise intermittently. Eye exam is current.  Hypertension This is a chronic problem. The current episode started more than 1 year ago. The problem has been gradually improving since onset. The problem is controlled. Pertinent negatives include no blurred vision, chest pain, headaches or palpitations. Past treatments include angiotensin blockers. The current treatment  provides moderate improvement. Compliance problems include exercise and diet.  Hypertensive end-organ damage includes kidney disease. Identifiable causes of hypertension include chronic renal disease.     Past Medical History:  Diagnosis Date  . Anemia   . Carpal tunnel syndrome   . Coronary artery disease    DES to ostial RCA 2005  . Diabetes mellitus without complication ([place])    No longer taking meds  . Dyslipidemia   . Hypertension   . Osteoarthritis   . PAD (peripheral artery disease) ([place])   . Sleep apnea      Family History  Problem Relation Age of Onset  . CAD Father 68  . Heart attack Father   . CAD Mother 40  . Heart attack Mother   . CAD Brother   . Cancer Brother        prostate  . CAD Sister   . Cancer Sister        breast  . CAD Brother      Current Outpatient Medications:  .  Accu-Chek FastClix Lancets MISC, CHECK BLOOD SUGAR THREE TIMES DAILY dx: e11.65, Disp: 100 each, Rfl: 2 .  amLODipine (NORVASC) 10 MG tablet, TAKE 1 TABLET(10 MG) BY MOUTH DAILY, Disp: 90 tablet, Rfl: 1 .  insulin degludec (TRESIBA FLEXTOUCH) 200 UNIT/ML FlexTouch Pen, Inject 26 Units into the skin daily. Max dose is 80 units, Disp: 9 mL, Rfl: 1 .  Insulin Pen Needle (BD PEN NEEDLE NANO 2ND GEN) 32G X 4 MM MISC, USE TO INJECT INSULIN  3 TIMES DAILY, Disp: 100 each, Rfl: 2 .  MAGNESIUM OXIDE PO, Take 500 mg by mouth daily. , Disp: , Rfl:  .  metoprolol tartrate (LOPRESSOR) 50 MG  tablet, TAKE 1 TABLET BY MOUTH TWICE DAILY, Disp: 180 tablet, Rfl: 4 .  Misc Natural Products (NEURIVA) CAPS, Take 1 capsule by mouth daily., Disp: , Rfl:  .  nystatin (NYSTATIN) powder, Apply topically 3 (three) times daily. As needed, Disp: 60 g, Rfl: 0 .  omeprazole (PRILOSEC) 20 MG capsule, TAKE ONE CAPSULE BY MOUTH DAILY., Disp: 90 capsule, Rfl: 1 .  PRESCRIPTION MEDICATION, 15 mg pain medication unsure of name take as needed prescribe by orthopedic, Disp: , Rfl:  .  Psyllium (METAMUCIL FIBER PO), Take by  mouth., Disp: , Rfl:  .  rosuvastatin (CRESTOR) 20 MG tablet, TAKE 1 TABLET(20 MG) BY MOUTH DAILY, Disp: 90 tablet, Rfl: 1 .  traZODone (DESYREL) 50 MG tablet, TAKE 1 TABLET BY MOUTH EVERY NIGHT AT BEDTIME, Disp: 90 tablet, Rfl: 1 .  TRULICITY 1.5 WT/8.8EK SOPN, ADMINISTER 1.5 MG UNDER THE SKIN 1 TIME A WEEK, Disp: 6 mL, Rfl: 3 .  Vitamin A 3 MG (10000 UT) TABS, Take 1 tablet by mouth daily., Disp: , Rfl:  .  vitamin C (ASCORBIC ACID) 500 MG tablet, Take 1,000 mg by mouth 2 (two) times daily. , Disp: , Rfl:  .  Vitamin D, Ergocalciferol, (DRISDOL) 1.25 MG (50000 UNIT) CAPS capsule, TAKE 1 CAPSULE BY MOUTH EVERY 7 DAYS, Disp: 12 capsule, Rfl: 1 .  vitamin E 600 UNIT capsule, Take 400 Units by mouth daily. , Disp: , Rfl:  .  Cholecalciferol (CVS VIT D 5000 HIGH-POTENCY PO), Take 1 tablet by mouth daily.  (Patient not taking: Reported on 09/27/2020), Disp: , Rfl:  .  Coenzyme Q10 (COQ-10) 100 MG CAPS, Take 1 tablet by mouth daily. , Disp: , Rfl:  .  Continuous Blood Gluc Receiver (FREESTYLE LIBRE 2 READER) DEVI, Use as directed to check blood sugars 3 times per day dx: e11.65, Disp: 1 each, Rfl: 1 .  Continuous Blood Gluc Sensor (FREESTYLE LIBRE 2 SENSOR) MISC, Use as directed to check blood sugars 3 times per day dx: e11.65, Disp: 6 each, Rfl: 1 .  Cyanocobalamin (VITAMIN B-12 PO), Take 500 mcg by mouth. , Disp: , Rfl:  .  dapagliflozin propanediol (FARXIGA) 10 MG TABS tablet, Take 1 tablet (10 mg total) by mouth daily before breakfast., Disp: 30 tablet, Rfl: 2 .  Flaxseed, Linseed, (FLAXSEED OIL) 1000 MG CAPS, Take 1 capsule by mouth daily., Disp: , Rfl:  .  FLUZONE HIGH-DOSE QUADRIVALENT 0.7 ML SUSY, , Disp: , Rfl:  .  glucosamine-chondroitin 500-400 MG tablet, Take 1 tablet by mouth daily. Reported on 12/17/2015, Disp: , Rfl:  .  glucose blood (ONETOUCH VERIO) test strip, CHECK BLOOD SUGAR THREE TIMES DAILY dx: e11.65, Disp: 150 each, Rfl: 11 .  insulin aspart (FIASP FLEXTOUCH) 100 UNIT/ML FlexTouch  Pen, Use sliding scale as directed three times daily. Inject 2 units for BG 150-199, 4 units for BG 200-249, 6 units for BG 250-299, 8 units for BG 300-340, 10 units for BG>350, BG>400 call MD, Disp: 9 mL, Rfl: 1 .  Insulin Pen Needle (BD PEN NEEDLE NANO 2ND GEN) 32G X 4 MM MISC, USE TO INJECT INSULIN DAILY, Disp: 100 each, Rfl: 2 .  OneTouch Delica Lancets 80K MISC, USE AS DIRECTED TO CHECK BLOOD SUGAR THREE TIMES DAILY, Disp: 100 each, Rfl: 0 .  pregabalin (LYRICA) 50 MG capsule, One capsule po nightly, Disp: 90 capsule, Rfl: 2   Allergies  Allergen Reactions  . [place]  . Latex Rash  Review of Systems  Constitutional: Negative.  Negative for fatigue.  Eyes: Negative for blurred vision.  Respiratory: Negative.   Cardiovascular: Negative.  Negative for chest pain and palpitations.  Gastrointestinal: Negative.   Endocrine: Negative for polydipsia, polyphagia and polyuria.  Musculoskeletal:       Right shoulder pain  Neurological: Negative for dizziness and headaches.  Psychiatric/Behavioral: Negative.   All other systems reviewed and are negative.    Today's Vitals   09/27/20 1141  BP: 132/80  Pulse: 69  Temp: 98.2 F (36.8 C)  TempSrc: Oral  Weight: 190 lb (86.2 kg)  Height: _0  (1.626 m)  PainSc: 6   PainLoc: Shoulder   Body mass index is 32.61 kg/m.  Wt Readings from Last 3 Encounters:  09/27/20 190 lb (86.2 kg)  07/04/20 190 lb (86.2 kg)  03/29/20 189 lb 9.6 oz (86 kg)   Objective:  Physical Exam Vitals and nursing note reviewed.  Constitutional:      Appearance: Normal appearance. She is obese.  HENT:     Head: Normocephalic and atraumatic.     Nose:     Comments: Masked     Mouth/Throat:     Comments: Masked  Cardiovascular:     Rate and Rhythm: Normal rate and regular rhythm.     Heart sounds: Normal heart sounds.  Pulmonary:     Effort: Pulmonary effort is normal.     Breath sounds: Normal breath sounds.  Musculoskeletal:      Cervical back: Normal range of motion.  Skin:    General: Skin is warm.  Neurological:     General: No focal deficit present.     Mental Status: She is alert.  Psychiatric:        Mood and Affect: Mood normal.        Behavior: Behavior normal.         Assessment And Plan:     1. Type 2 diabetes mellitus with stage 2 chronic kidney disease, with long-term current use of insulin ([facility]) Comments: Chronic, she has been checking her sugars 4x/day. Pt advised to wait 2 hours after eating to check sugars. I will check labs as below.  - Hemoglobin A1c - CMP14+EGFR  2. Parenchymal renal hypertension, stage 1 through stage 4 or unspecified chronic kidney disease Comments: Chronic, controlled. Advised to follow low sodium diet. I will check renal function today.   3. Atherosclerosis of native coronary artery of native heart with angina pectoris ([place]) Comments: Chronic, advised to follow heart healthy lifestyle. Importance of statin compliance was d/w patient.   4. Class 1 obesity due to excess calories with serious comorbidity and body mass index (BMI) of 32.0 to 32.9 in adult Comments: She is encouraged to strive for BMI less than 30 to decrease cardiac risk. Advised to aim for at least 150 minutes of exercise per week.   5. Encounter for screening mammogram for malignant neoplasm of breast Comments: She is encouraged to perform monthly self breast exams. She agrees to [facility] referral. - MM Digital Screening; Future  Patient was given opportunity to ask questions. Patient verbalized understanding of the plan and was able to repeat key elements of the plan. All questions were answered to their satisfaction.   I, [name], MD, have reviewed all documentation for this visit. The documentation on 10/14/20 for the exam, diagnosis, procedures, and orders are all accurate and complete.   IF YOU HAVE BEEN REFERRED TO A SPECIALIST, IT MAY TAKE 1-2 WEEKS TO  SCHEDULE/PROCESS THE REFERRAL. IF YOU  HAVE NOT HEARD FROM US/SPECIALIST IN TWO WEEKS, PLEASE GIVE [place] A CALL AT [phone] X [phone].   THE PATIENT IS ENCOURAGED TO PRACTICE SOCIAL DISTANCING DUE TO THE COVID-19 PANDEMIC.

## 2020-09-28 ENCOUNTER — Other Ambulatory Visit: Payer: Self-pay

## 2020-09-28 DIAGNOSIS — E1122 Type 2 diabetes mellitus with diabetic chronic kidney disease: Secondary | ICD-10-CM

## 2020-09-28 LAB — CMP14+EGFR
ALT: 11 IU/L (ref 0–32)
AST: 17 IU/L (ref 0–40)
Albumin/Globulin Ratio: 1.6 (ref 1.2–2.2)
Albumin: 4.5 g/dL (ref 3.8–4.8)
Alkaline Phosphatase: 169 IU/L — ABNORMAL HIGH (ref 44–121)
BUN/Creatinine Ratio: 13 (ref 12–28)
BUN: 10 mg/dL (ref 8–27)
Bilirubin Total: 0.3 mg/dL (ref 0.0–1.2)
CO2: 24 mmol/L (ref 20–29)
Calcium: 9.4 mg/dL (ref 8.7–10.3)
Chloride: 104 mmol/L (ref 96–106)
Creatinine, Ser: 0.76 mg/dL (ref 0.57–1.00)
Globulin, Total: 2.9 g/dL (ref 1.5–4.5)
Glucose: 115 mg/dL — ABNORMAL HIGH (ref 65–99)
Potassium: 4.2 mmol/L (ref 3.5–5.2)
Sodium: 145 mmol/L — ABNORMAL HIGH (ref 134–144)
Total Protein: 7.4 g/dL (ref 6.0–8.5)
eGFR: 85 mL/min/{1.73_m2} (ref >59)

## 2020-09-28 LAB — HEMOGLOBIN A1C
Est. average glucose Bld gHb Est-mCnc: 169 mg/dL
Hgb A1c MFr Bld: 7.5 % — ABNORMAL HIGH (ref 4.8–5.6)

## 2020-09-28 MED ORDER — DAPAGLIFLOZIN PROPANEDIOL 10 MG PO TABS: 10.0000 mg | ORAL_TABLET | Freq: Every day | ORAL | 2 refills | Status: DC

## 2020-10-03 ENCOUNTER — Telehealth: Payer: Self-pay

## 2020-10-03 NOTE — Telephone Encounter (Signed)
The pt wanted to know if she is to take the [place] along with the sliding scale insulin and tresiba and trulicity weekly.

## 2020-10-04 DIAGNOSIS — M25561 Pain in right knee: Secondary | ICD-10-CM | POA: Diagnosis not present

## 2020-10-04 DIAGNOSIS — M1711 Unilateral primary osteoarthritis, right knee: Secondary | ICD-10-CM | POA: Diagnosis not present

## 2020-10-04 DIAGNOSIS — M67911 Unspecified disorder of synovium and tendon, right shoulder: Secondary | ICD-10-CM | POA: Diagnosis not present

## 2020-10-09 ENCOUNTER — Other Ambulatory Visit: Payer: Self-pay | Admitting: Internal Medicine

## 2020-10-09 DIAGNOSIS — Z1231 Encounter for screening mammogram for malignant neoplasm of breast: Secondary | ICD-10-CM

## 2020-10-13 ENCOUNTER — Other Ambulatory Visit: Payer: Self-pay | Admitting: Internal Medicine

## 2020-10-14 DIAGNOSIS — Z794 Long term (current) use of insulin: Secondary | ICD-10-CM | POA: Diagnosis not present

## 2020-10-14 DIAGNOSIS — E119 Type 2 diabetes mellitus without complications: Secondary | ICD-10-CM | POA: Diagnosis not present

## 2020-10-18 ENCOUNTER — Other Ambulatory Visit: Payer: Self-pay

## 2020-10-18 MED ORDER — INSULIN LISPRO (1 UNIT DIAL) 100 UNIT/ML (KWIKPEN): PEN_INJECTOR | SUBCUTANEOUS | 0 refills | Status: DC

## 2020-10-21 DIAGNOSIS — Z1152 Encounter for screening for COVID-19: Secondary | ICD-10-CM | POA: Diagnosis not present

## 2020-10-31 ENCOUNTER — Telehealth: Payer: Self-pay

## 2020-10-31 NOTE — Chronic Care Management (AMB) (Addendum)
Chronic Care Management Pharmacy Assistant   Name: [name] [name]  MRN: [id number] DOB: 1950-12-02   Reason for Encounter: Disease State/ Diabetes    Recent office visits:  08-28-2020 [name]., RN (CCM)  09-27-2020 [name], MD. STOP aspirin 81mg . STOP meloxicam 15mg . CHANGE lyrica 50mg  twice daily to 50mg  at night.  Recent consult visits:  08-30-2020 , DPM (podiatry)   Hospital visits:  None in previous 6 months  Medications: Outpatient Encounter Medications as of 10/31/2020  Medication Sig  . Accu-Chek FastClix Lancets MISC CHECK BLOOD SUGAR THREE TIMES DAILY dx: e11.65  . amLODipine (NORVASC) 10 MG tablet TAKE 1 TABLET(10 MG) BY MOUTH DAILY  . Cholecalciferol ([facility]S VIT D 5000 HIGH-POTENCY PO) Take 1 tablet by mouth daily.  (Patient not taking: Reported on 09/27/2020)  . Coenzyme Q10 (COQ-10) 100 MG CAPS Take 1 tablet by mouth daily.   . Continuous Blood Gluc Receiver (FREESTYLE LIBRE 2 READER) DEVI Use as directed to check blood sugars 3 times per day dx: e11.65  . Continuous Blood Gluc Sensor (FREESTYLE LIBRE 2 SENSOR) MISC Use as directed to check blood sugars 3 times per day dx: e11.65  . Cyanocobalamin (VITAMIN B-12 PO) Take 500 mcg by mouth.   . dapagliflozin propanediol (FARXIGA) 10 MG TABS tablet Take 1 tablet (10 mg total) by mouth daily before breakfast.  . Flaxseed, Linseed, (FLAXSEED OIL) 1000 MG CAPS Take 1 capsule by mouth daily.  10-30-2020 FLUZONE HIGH-DOSE QUADRIVALENT 0.7 ML SUSY   . glucosamine-chondroitin 500-400 MG tablet Take 1 tablet by mouth daily. Reported on 12/17/2015  . glucose blood (ONETOUCH VERIO) test strip CHECK BLOOD SUGAR THREE TIMES DAILY dx: e11.65  . insulin aspart (FIASP FLEXTOUCH) 100 UNIT/ML FlexTouch Pen Use sliding scale as directed three times daily. Inject 2 units for BG 150-199, 4 units for BG 200-249, 6 units for BG 250-299, 8 units for BG 300-340, 10 units for BG>350, BG>400 call MD  . insulin degludec (TRESIBA  FLEXTOUCH) 200 UNIT/ML FlexTouch Pen Inject 26 Units into the skin daily. Max dose is 80 units  . insulin lispro (HUMALOG KWIKPEN) 100 UNIT/ML KwikPen Inject subcutaneously 2 units if sugars are 150 - 199, 4 units 200 -249, 6 units 250- 299, 8 units 300 - 349, 10 units >350  . Insulin Pen Needle (BD PEN NEEDLE NANO 2ND GEN) 32G X 4 MM MISC USE TO INJECT INSULIN DAILY  . MAGNESIUM OXIDE PO Take 500 mg by mouth daily.   . metoprolol tartrate (LOPRESSOR) 50 MG tablet TAKE 1 TABLET BY MOUTH TWICE DAILY  . Misc Natural Products (NEURIVA) CAPS Take 1 capsule by mouth daily.  12/31/2020 nystatin (NYSTATIN) powder Apply topically 3 (three) times daily. As needed  . omeprazole (PRILOSEC) 20 MG capsule TAKE ONE CAPSULE BY MOUTH DAILY.  11/27/2020 Delica Lancets 33G MISC USE AS DIRECTED TO CHECK BLOOD SUGAR THREE TIMES DAILY  . pregabalin (LYRICA) 50 MG capsule One capsule po nightly  . PRESCRIPTION MEDICATION 15 mg pain medication unsure of name take as needed prescribe by orthopedic  . Psyllium (METAMUCIL FIBER PO) Take by mouth.  . rosuvastatin (CRESTOR) 20 MG tablet TAKE 1 TABLET(20 MG) BY MOUTH DAILY  . traZODone (DESYREL) 50 MG tablet TAKE 1 TABLET BY MOUTH EVERY NIGHT AT BEDTIME  . TRULICITY 1.5 MG/0.5ML SOPN ADMINISTER 1.5 MG UNDER THE SKIN 1 TIME A WEEK  . Vitamin A 3 MG (10000 UT) TABS Take 1 tablet by mouth daily.  . vitamin C (ASCORBIC  ACID) 500 MG tablet Take 1,000 mg by mouth 2 (two) times daily.   . Vitamin D, Ergocalciferol, (DRISDOL) 1.25 MG (50000 UNIT) CAPS capsule TAKE 1 CAPSULE BY MOUTH EVERY 7 DAYS  . vitamin E 600 UNIT capsule Take 400 Units by mouth daily.    No facility-administered encounter medications on file as of 10/31/2020.   Recent Relevant Labs: Lab Results  Component Value Date/Time   HGBA1C 7.5 (H) 09/27/2020 12:15 PM   HGBA1C 7.8 (H) 07/04/2020 02:36 PM   MICROALBUR 80 03/29/2020 11:31 AM   MICROALBUR 30 03/23/2019 11:44 AM    Kidney Function Lab Results  Component  Value Date/Time   CREATININE 0.76 09/27/2020 12:15 PM   CREATININE 0.80 07/04/2020 02:36 PM   GFRNONAA 75 07/04/2020 02:36 PM   GFRAA 87 07/04/2020 02:36 PM   Recent Relevant Labs: Lab Results  Component Value Date/Time   HGBA1C 7.5 (H) 09/27/2020 12:15 PM   HGBA1C 7.8 (H) 07/04/2020 02:36 PM   MICROALBUR 80 03/29/2020 11:31 AM   MICROALBUR 30 03/23/2019 11:44 AM    Kidney Function Lab Results  Component Value Date/Time   CREATININE 0.76 09/27/2020 12:15 PM   CREATININE 0.80 07/04/2020 02:36 PM   GFRNONAA 75 07/04/2020 02:36 PM   GFRAA 87 07/04/2020 02:36 PM    . Current antihyperglycemic regimen:   Trulicity 1.5mg  once weekly  [name] [name] 26 units daily(bedtime)  Fiasp per sliding scale three times daily  . What recent interventions/DTPs have been made to improve glycemic control:  o Patient states she's taking medication as directed.  . Have there been any recent hospitalizations or ED visits since last visit with CPP? No   . Patient denies hypoglycemic symptoms  . Patient denies hyperglycemic symptoms  . How often are you checking your blood sugar? 3-4 times daily   . What are your blood sugars ranging?  o Fasting: 121, 128, 129, 125 o Before meals: None o After meals: 124, 111, 130, 132, 160 o Bedtime: None . During the week, how often does your blood glucose drop below 70? Never   . Are you checking your feet daily/regularly? Patient stated daily  Adherence Review: Is the patient currently on a STATIN medication? Yes Is the patient currently on ACE/ARB medication? No Does the patient have >5 day gap between last estimated fill dates? No  NOTES: Patient stated her sugars have been for the most part normal but sometimes they elevate after she eats. Patient stated when that happens she walks around and then sugars go back to normal range. Sent scheduling a message to schedule patient for 01-03-2021 at 10:30 with Cherylin Mylar [facility].  Star Rating  Drugs: Rosuvastatin 20mg - Last filled 08-27-2020 [facility] Farxiga 10mg - Last filled 09-28-2020 [facility] Trulicity 1.76ml- Last filled 09-16-2020 [facility]   [facility] CMA Health Concierge  [phone]

## 2020-11-07 DIAGNOSIS — Z20822 Contact with and (suspected) exposure to covid-19: Secondary | ICD-10-CM | POA: Diagnosis not present

## 2020-11-14 DIAGNOSIS — H43812 Vitreous degeneration, left eye: Secondary | ICD-10-CM | POA: Diagnosis not present

## 2020-11-14 DIAGNOSIS — H04123 Dry eye syndrome of bilateral lacrimal glands: Secondary | ICD-10-CM | POA: Diagnosis not present

## 2020-11-14 DIAGNOSIS — H25813 Combined forms of age-related cataract, bilateral: Secondary | ICD-10-CM | POA: Diagnosis not present

## 2020-11-14 DIAGNOSIS — E119 Type 2 diabetes mellitus without complications: Secondary | ICD-10-CM | POA: Diagnosis not present

## 2020-11-14 LAB — HM DIABETES EYE EXAM

## 2020-11-15 ENCOUNTER — Encounter: Payer: Self-pay | Admitting: Internal Medicine

## 2020-11-17 DIAGNOSIS — Z20822 Contact with and (suspected) exposure to covid-19: Secondary | ICD-10-CM | POA: Diagnosis not present

## 2020-11-26 DIAGNOSIS — Z20822 Contact with and (suspected) exposure to covid-19: Secondary | ICD-10-CM | POA: Diagnosis not present

## 2020-11-27 DIAGNOSIS — E119 Type 2 diabetes mellitus without complications: Secondary | ICD-10-CM | POA: Diagnosis not present

## 2020-11-27 DIAGNOSIS — Z794 Long term (current) use of insulin: Secondary | ICD-10-CM | POA: Diagnosis not present

## 2020-11-28 ENCOUNTER — Ambulatory Visit
Admission: RE | Admit: 2020-11-28 | Discharge: 2020-11-28 | Disposition: A | Discharge status: Home / Self Care | Payer: Medicare Other | Source: Ambulatory Visit | Attending: Internal Medicine | Admitting: Internal Medicine

## 2020-11-28 ENCOUNTER — Other Ambulatory Visit: Payer: Self-pay

## 2020-11-28 DIAGNOSIS — Z1231 Encounter for screening mammogram for malignant neoplasm of breast: Secondary | ICD-10-CM

## 2020-11-29 ENCOUNTER — Telehealth: Payer: Self-pay

## 2020-11-29 NOTE — Chronic Care Management (AMB) (Signed)
Chronic Care Management Pharmacy Assistant   Name: [name] [name]  MRN: [id number] DOB: 1950-12-03   Reason for Encounter: Disease State/ Diabetes   Recent office visits:  None  Recent consult visits:  None  Hospital visits:  None in previous 6 months  Medications: Outpatient Encounter Medications as of 11/29/2020  Medication Sig   Accu-Chek FastClix Lancets MISC CHECK BLOOD SUGAR THREE TIMES DAILY dx: e11.65   amLODipine (NORVASC) 10 MG tablet TAKE 1 TABLET(10 MG) BY MOUTH DAILY   Cholecalciferol ([facility]S VIT D 5000 HIGH-POTENCY PO) Take 1 tablet by mouth daily.  (Patient not taking: Reported on 09/27/2020)   Coenzyme Q10 (COQ-10) 100 MG CAPS Take 1 tablet by mouth daily.    Continuous Blood Gluc Receiver (FREESTYLE LIBRE 2 READER) DEVI Use as directed to check blood sugars 3 times per day dx: e11.65   Continuous Blood Gluc Sensor (FREESTYLE LIBRE 2 SENSOR) MISC Use as directed to check blood sugars 3 times per day dx: e11.65   Cyanocobalamin (VITAMIN B-12 PO) Take 500 mcg by mouth.    dapagliflozin propanediol (FARXIGA) 10 MG TABS tablet Take 1 tablet (10 mg total) by mouth daily before breakfast.   Flaxseed, Linseed, (FLAXSEED OIL) 1000 MG CAPS Take 1 capsule by mouth daily.   FLUZONE HIGH-DOSE QUADRIVALENT 0.7 ML SUSY    glucosamine-chondroitin 500-400 MG tablet Take 1 tablet by mouth daily. Reported on 12/17/2015   glucose blood (ONETOUCH VERIO) test strip CHECK BLOOD SUGAR THREE TIMES DAILY dx: e11.65   insulin aspart (FIASP FLEXTOUCH) 100 UNIT/ML FlexTouch Pen Use sliding scale as directed three times daily. Inject 2 units for BG 150-199, 4 units for BG 200-249, 6 units for BG 250-299, 8 units for BG 300-340, 10 units for BG>350, BG>400 call MD   insulin degludec (TRESIBA FLEXTOUCH) 200 UNIT/ML FlexTouch Pen Inject 26 Units into the skin daily. Max dose is 80 units   insulin lispro (HUMALOG KWIKPEN) 100 UNIT/ML KwikPen Inject subcutaneously 2 units if sugars are 150 - 199, 4 units  200 -249, 6 units 250- 299, 8 units 300 - 349, 10 units >350   Insulin Pen Needle (BD PEN NEEDLE NANO 2ND GEN) 32G X 4 MM MISC USE TO INJECT INSULIN DAILY   MAGNESIUM OXIDE PO Take 500 mg by mouth daily.    metoprolol tartrate (LOPRESSOR) 50 MG tablet TAKE 1 TABLET BY MOUTH TWICE DAILY   Misc Natural Products (NEURIVA) CAPS Take 1 capsule by mouth daily.   nystatin (NYSTATIN) powder Apply topically 3 (three) times daily. As needed   omeprazole (PRILOSEC) 20 MG capsule TAKE ONE CAPSULE BY MOUTH DAILY.   OneTouch Delica Lancets 33G MISC USE AS DIRECTED TO CHECK BLOOD SUGAR THREE TIMES DAILY   pregabalin (LYRICA) 50 MG capsule One capsule po nightly   PRESCRIPTION MEDICATION 15 mg pain medication unsure of name take as needed prescribe by orthopedic   Psyllium (METAMUCIL FIBER PO) Take by mouth.   rosuvastatin (CRESTOR) 20 MG tablet TAKE 1 TABLET(20 MG) BY MOUTH DAILY   traZODone (DESYREL) 50 MG tablet TAKE 1 TABLET BY MOUTH EVERY NIGHT AT BEDTIME   TRULICITY 1.5 MG/0.5ML SOPN ADMINISTER 1.5 MG UNDER THE SKIN 1 TIME A WEEK   Vitamin A 3 MG (10000 UT) TABS Take 1 tablet by mouth daily.   vitamin C (ASCORBIC ACID) 500 MG tablet Take 1,000 mg by mouth 2 (two) times daily.    Vitamin D, Ergocalciferol, (DRISDOL) 1.25 MG (50000 UNIT) CAPS capsule TAKE 1 CAPSULE BY MOUTH  EVERY 7 DAYS   vitamin E 600 UNIT capsule Take 400 Units by mouth daily.    No facility-administered encounter medications on file as of 11/29/2020.   Recent Relevant Labs: Lab Results  Component Value Date/Time   HGBA1C 7.5 (H) 09/27/2020 12:15 PM   HGBA1C 7.8 (H) 07/04/2020 02:36 PM   MICROALBUR 80 03/29/2020 11:31 AM   MICROALBUR 30 03/23/2019 11:44 AM    Kidney Function Lab Results  Component Value Date/Time   CREATININE 0.76 09/27/2020 12:15 PM   CREATININE 0.80 07/04/2020 02:36 PM   GFRNONAA 75 07/04/2020 02:36 PM   GFRAA 87 07/04/2020 02:36 PM    Current antihyperglycemic regimen:  Trulicity  1.5mg  once  weekly Tresiba 26 units daily (bedtime) Fiasp per sliding scale three times daily  What recent interventions/DTPs have been made to improve glycemic control:  Patient states she has been taking medications as prescribed.  Have there been any recent hospitalizations or ED visits since last visit with CPP? No  Patient denies hypoglycemic symptoms  Patient denies hyperglycemic symptoms  How often are you checking your blood sugar? 3-4 times daily What are your blood sugars ranging?  Fasting: 11-29-2020 117 Before meals: 11-29-2020 121 After meals: 11-29-2020 148 Bedtime: none During the week, how often does your blood glucose drop below 70? Never  Are you checking your feet daily/regularly? Patient states daily  Adherence Review: Is the patient currently on a STATIN medication? Yes Is the patient currently on ACE/ARB medication? No Does the patient have >5 day gap between last estimated fill dates? No   Star Rating Drugs: Rosuvastatin 20mg - Last filled 11-25-2020 [facility] Farxiga 10mg - Last filled 11-16-2020 [facility] Trulicity 1.5ml- Last filled 09-16-2020 [facility]  4m CMA Clinical Pharmacist Assistant [phone]

## 2020-12-03 DIAGNOSIS — Z20822 Contact with and (suspected) exposure to covid-19: Secondary | ICD-10-CM | POA: Diagnosis not present

## 2020-12-06 ENCOUNTER — Ambulatory Visit (INDEPENDENT_AMBULATORY_CARE_PROVIDER_SITE_OTHER): Payer: Medicare Other | Admitting: Sports Medicine

## 2020-12-06 ENCOUNTER — Encounter: Payer: Self-pay | Admitting: Sports Medicine

## 2020-12-06 ENCOUNTER — Other Ambulatory Visit: Payer: Self-pay

## 2020-12-06 DIAGNOSIS — E119 Type 2 diabetes mellitus without complications: Secondary | ICD-10-CM | POA: Diagnosis not present

## 2020-12-06 DIAGNOSIS — M2042 Other hammer toe(s) (acquired), left foot: Secondary | ICD-10-CM

## 2020-12-06 DIAGNOSIS — M2142 Flat foot [pes planus] (acquired), left foot: Secondary | ICD-10-CM

## 2020-12-06 DIAGNOSIS — M79675 Pain in left toe(s): Secondary | ICD-10-CM

## 2020-12-06 DIAGNOSIS — M21619 Bunion of unspecified foot: Secondary | ICD-10-CM

## 2020-12-06 DIAGNOSIS — M2041 Other hammer toe(s) (acquired), right foot: Secondary | ICD-10-CM

## 2020-12-06 DIAGNOSIS — M79674 Pain in right toe(s): Secondary | ICD-10-CM

## 2020-12-06 DIAGNOSIS — M2141 Flat foot [pes planus] (acquired), right foot: Secondary | ICD-10-CM

## 2020-12-06 DIAGNOSIS — B351 Tinea unguium: Secondary | ICD-10-CM | POA: Diagnosis not present

## 2020-12-06 NOTE — Progress Notes (Signed)
Subjective: [name] [name] is a 70 y.o. female patient with history of diabetes who presents to office today complaining of long,mildly painful nails  while ambulating in shoes; unable to trim. Patient states that the glucose reading this morning was 101 mg/dl. Patient reports that her A1c 7.5 PCP 2.5 months ago. No changes since last encounter with medications.   Patient Active Problem List   Diagnosis Date Noted   Parenchymal renal hypertension 07/04/2020   Educated about COVID-19 virus infection 03/25/2020   OSA and COPD overlap syndrome ([place]) 03/14/2019   Snoring 02/17/2019   Peripheral arterial disease ([place]) 01/26/2019   Hepatitis C carrier ([place]) 01/26/2019   Hypertensive heart and renal disease 08/19/2018   Class 1 obesity due to excess calories with serious comorbidity and body mass index (BMI) of 33.0 to 33.9 in adult 08/19/2018   Paresthesia of left upper extremity 06/29/2018   Atherosclerosis of native coronary artery of native heart with angina pectoris ([place]) 04/09/2018   Dyslipidemia 04/09/2018   Primary osteoarthritis of both feet 11/05/2017   Primary osteoarthritis of both hands 10/30/2017   Primary osteoarthritis of both knees 10/30/2017   Primary insomnia 10/30/2017   Family history of lupus erythematosus 10/30/2017   Dyspnea on exertion 12/10/2016   Type 2 diabetes mellitus with stage 2 chronic kidney disease, with long-term current use of insulin ([place]) 09/21/2014   Acute cystitis 09/21/2014   Hyperglycemia 09/19/2014   Bradycardia 09/19/2014   Hyponatremia 09/19/2014   Elevated alkaline phosphatase level 09/19/2014   Acute kidney injury [facility])    Essential hypertension, benign 07/05/2008   Coronary atherosclerosis 07/05/2008   TOBACCO USER 07/03/2008   Current Outpatient Medications on File Prior to Visit  Medication Sig Dispense Refill   Accu-Chek FastClix Lancets MISC CHECK BLOOD SUGAR THREE TIMES DAILY dx: e11.65 100 each 2   amLODipine (NORVASC) 10 MG  tablet TAKE 1 TABLET(10 MG) BY MOUTH DAILY 90 tablet 1   amLODipine (NORVASC) 10 MG tablet 1 tablet     Ascorbic Acid (VITAMIN C) 500 MG CHEW 1 tablet     Cholecalciferol ([facility]S VIT D 5000 HIGH-POTENCY PO) Take 1 tablet by mouth daily.  (Patient not taking: Reported on 09/27/2020)     Chromium Picolinate 200 MCG TABS 1 tablet     Cobalamin Combinations (VITAMIN B12-FOLIC ACID) 599-357 MCG TABS 1 tab     Coenzyme Q10 (CO Q-10) 100 MG CAPS 1 capsule     Coenzyme Q10 (COQ-10) 100 MG CAPS Take 1 tablet by mouth daily.      Continuous Blood Gluc Receiver (FREESTYLE LIBRE 2 READER) DEVI Use as directed to check blood sugars 3 times per day dx: e11.65 1 each 1   Continuous Blood Gluc Sensor (FREESTYLE LIBRE 2 SENSOR) MISC Use as directed to check blood sugars 3 times per day dx: e11.65 6 each 1   Cyanocobalamin (VITAMIN B-12 PO) Take 500 mcg by mouth.      dapagliflozin propanediol (FARXIGA) 10 MG TABS tablet Take 1 tablet (10 mg total) by mouth daily before breakfast. 30 tablet 2   Flaxseed, Linseed, (FLAXSEED OIL) 1000 MG CAPS Take 1 capsule by mouth daily.     FLUZONE HIGH-DOSE QUADRIVALENT 0.7 ML SUSY      gabapentin (NEURONTIN) 300 MG capsule 1 tablet     glucosamine-chondroitin 500-400 MG tablet Take 1 tablet by mouth daily. Reported on 12/17/2015     glucose blood (ONETOUCH VERIO) test strip CHECK BLOOD SUGAR THREE TIMES DAILY dx: e11.65 150 each 11  insulin aspart (FIASP FLEXTOUCH) 100 UNIT/ML FlexTouch Pen Use sliding scale as directed three times daily. Inject 2 units for BG 150-199, 4 units for BG 200-249, 6 units for BG 250-299, 8 units for BG 300-340, 10 units for BG>350, BG>400 call MD 9 mL 1   insulin degludec (TRESIBA FLEXTOUCH) 200 UNIT/ML FlexTouch Pen Inject 26 Units into the skin daily. Max dose is 80 units 9 mL 1   insulin glargine (LANTUS SOLOSTAR) 100 UNIT/ML Solostar Pen 16 units     insulin lispro (HUMALOG KWIKPEN) 100 UNIT/ML KwikPen Inject subcutaneously 2 units if sugars are 150  - 199, 4 units 200 -249, 6 units 250- 299, 8 units 300 - 349, 10 units >350 15 mL 0   Insulin Pen Needle (BD PEN NEEDLE NANO 2ND GEN) 32G X 4 MM MISC USE TO INJECT INSULIN DAILY 100 each 2   Krill Oil 300 MG CAPS (324m) 1 capsule     MAGNESIUM OXIDE PO Take 500 mg by mouth daily.      metoprolol tartrate (LOPRESSOR) 50 MG tablet TAKE 1 TABLET BY MOUTH TWICE DAILY 180 tablet 4   Misc Natural Products (NEURIVA) CAPS Take 1 capsule by mouth daily.     nystatin (NYSTATIN) powder Apply topically 3 (three) times daily. As needed 60 g 0   Nystatin POWD See admin instructions.     omeprazole (PRILOSEC) 20 MG capsule TAKE ONE CAPSULE BY MOUTH DAILY. 90 capsule 1   omeprazole (PRILOSEC) 20 MG capsule 1 capsule 30 minutes before morning meal     OneTouch Delica Lancets 316WMISC USE AS DIRECTED TO CHECK BLOOD SUGAR THREE TIMES DAILY 100 each 0   pregabalin (LYRICA) 50 MG capsule One capsule po nightly 90 capsule 2   pregabalin (LYRICA) 50 MG capsule 1 capsule     PRESCRIPTION MEDICATION 15 mg pain medication unsure of name take as needed prescribe by orthopedic     Psyllium (METAMUCIL FIBER PO) Take by mouth.     rosuvastatin (CRESTOR) 20 MG tablet TAKE 1 TABLET(20 MG) BY MOUTH DAILY 90 tablet 1   sitaGLIPtin (JANUVIA) 100 MG tablet 1 tablet     traMADol (ULTRAM) 50 MG tablet TAKE 1 TO 2 TABLETS BY MOUTH 2 TO 3 TIMES DAILY WHEN NECESSARY     traZODone (DESYREL) 50 MG tablet TAKE 1 TABLET BY MOUTH EVERY NIGHT AT BEDTIME 90 tablet 1   TRULICITY 1.5 MFU/9.3ATSOPN ADMINISTER 1.5 MG UNDER THE SKIN 1 TIME A WEEK 6 mL 3   Vitamin A 3 MG (10000 UT) TABS Take 1 tablet by mouth daily.     vitamin C (ASCORBIC ACID) 500 MG tablet Take 1,000 mg by mouth 2 (two) times daily.      Vitamin D, Ergocalciferol, (DRISDOL) 1.25 MG (50000 UNIT) CAPS capsule TAKE 1 CAPSULE BY MOUTH EVERY 7 DAYS 12 capsule 1   vitamin E 600 UNIT capsule Take 400 Units by mouth daily.      No current facility-administered medications on file  prior to visit.   Allergies  Allergen Reactions   Fish Allergy     Cod   Latex Rash    Recent Results (from the past 2160 hour(s))  Hemoglobin A1c     Status: Abnormal   Collection Time: 09/27/20 12:15 PM  Result Value Ref Range   Hgb A1c MFr Bld 7.5 (H) 4.8 - 5.6 %    Comment:          Prediabetes: 5.7 - 6.4  Diabetes: >6.4          Glycemic control for adults with diabetes: <7.0    Est. average glucose Bld gHb Est-mCnc 169 mg/dL  CMP14+EGFR     Status: Abnormal   Collection Time: 09/27/20 12:15 PM  Result Value Ref Range   Glucose 115 (H) 65 - 99 mg/dL   BUN 10 8 - 27 mg/dL   Creatinine, Ser 0.76 0.57 - 1.00 mg/dL   eGFR 85 >59 mL/min/1.73   BUN/Creatinine Ratio 13 12 - 28   Sodium 145 (H) 134 - 144 mmol/L   Potassium 4.2 3.5 - 5.2 mmol/L   Chloride 104 96 - 106 mmol/L   CO2 24 20 - 29 mmol/L   Calcium 9.4 8.7 - 10.3 mg/dL   Total Protein 7.4 6.0 - 8.5 g/dL   Albumin 4.5 3.8 - 4.8 g/dL   Globulin, Total 2.9 1.5 - 4.5 g/dL   Albumin/Globulin Ratio 1.6 1.2 - 2.2   Bilirubin Total 0.3 0.0 - 1.2 mg/dL   Alkaline Phosphatase 169 (H) 44 - 121 IU/L   AST 17 0 - 40 IU/L   ALT 11 0 - 32 IU/L  HM DIABETES EYE EXAM     Status: None   Collection Time: 11/14/20 12:00 AM  Result Value Ref Range   HM Diabetic Eye Exam No Retinopathy No Retinopathy    Objective: General: Patient is awake, alert, and oriented x 3 and in no acute distress.  Integument: Skin is warm, dry and supple bilateral. Nails are tender, long, thickened and dystrophic with subungual debris, consistent with onychomycosis, 1-5 bilateral. No signs of infection. No open lesions or preulcerative lesions present bilateral. Remaining integument unremarkable.  Vasculature:  Dorsalis Pedis pulse 1/4 bilateral. Posterior Tibial pulse  1/4 bilateral.  Capillary fill time <5 sec 1-5 bilateral. Positive hair growth to the level of the digits. Temperature gradient within normal limits. No varicosities present  bilateral. No edema present bilateral.   Neurology: The patient has intact sensation measured with a 5.07/10g Semmes Weinstein Monofilament at all pedal sites bilateral . Vibratory sensation diminished bilateral with tuning fork.   Musculoskeletal: Asymptomatic  Right bunion, pes planus and hammertoe pedal deformities noted bilateral. Muscular strength 5/5 in all lower extremity muscular groups bilateral without pain on range of motion . No tenderness with calf compression bilateral.  Assessment and Plan: Problem List Items Addressed This Visit   None Visit Diagnoses     Pain due to onychomycosis of toenails of both feet    -  Primary   Hammer toes of both feet       Pes planus of both feet       Bunion       Diabetes mellitus without complication (HCC)       Relevant Medications   insulin glargine (LANTUS SOLOSTAR) 100 UNIT/ML Solostar Pen   sitaGLIPtin (JANUVIA) 100 MG tablet       -Examined patient. -Re-Discussed and educated patient on diabetic foot care, especially with  regards to the vascular, neurological and musculoskeletal systems.  -Mechanically debrided all nails 1-5 bilateral using sterile nail nipper and filed with dremel without incident  -Continue with diabetic shoes  -Answered all patient questions -Patient advised to call the office if any problems or questions arise in the meantime.  Landis Martins, DPM

## 2020-12-08 ENCOUNTER — Other Ambulatory Visit: Payer: Self-pay | Admitting: Internal Medicine

## 2020-12-11 DIAGNOSIS — Z20822 Contact with and (suspected) exposure to covid-19: Secondary | ICD-10-CM | POA: Diagnosis not present

## 2020-12-17 ENCOUNTER — Other Ambulatory Visit: Payer: Self-pay | Admitting: Internal Medicine

## 2020-12-17 DIAGNOSIS — N182 Chronic kidney disease, stage 2 (mild): Secondary | ICD-10-CM

## 2020-12-18 DIAGNOSIS — Z794 Long term (current) use of insulin: Secondary | ICD-10-CM | POA: Diagnosis not present

## 2020-12-18 DIAGNOSIS — E1165 Type 2 diabetes mellitus with hyperglycemia: Secondary | ICD-10-CM | POA: Diagnosis not present

## 2020-12-19 DIAGNOSIS — Z20822 Contact with and (suspected) exposure to covid-19: Secondary | ICD-10-CM | POA: Diagnosis not present

## 2020-12-27 DIAGNOSIS — E119 Type 2 diabetes mellitus without complications: Secondary | ICD-10-CM | POA: Diagnosis not present

## 2020-12-27 DIAGNOSIS — Z20822 Contact with and (suspected) exposure to covid-19: Secondary | ICD-10-CM | POA: Diagnosis not present

## 2020-12-27 DIAGNOSIS — Z794 Long term (current) use of insulin: Secondary | ICD-10-CM | POA: Diagnosis not present

## 2021-01-01 DIAGNOSIS — Z20822 Contact with and (suspected) exposure to covid-19: Secondary | ICD-10-CM | POA: Diagnosis not present

## 2021-01-02 ENCOUNTER — Telehealth: Payer: Self-pay

## 2021-01-02 NOTE — Progress Notes (Signed)
  Patient aware of telephone appointment with [facility] Person on 01-03-2021 at 10:30.Patient aware to have/bring all medications, supplements, blood pressure and/or blood sugar logs to visit.  Questions: Have you had any recent office visit or specialist visit outside of [facility]? Patient stated No  Are there any concerns you would like to discuss during your office visit? Patient stated she had a few questions about farxiga.  Are you having any problems obtaining your medications? (Whether it pharmacy issues or cost) Patient stated no  Care Gaps: Covid booster overdue Medicare wellness 04-25-2021 RAF= 1.762 %  Star Rating Drug: Rosuvastatin 20 mg- Last filled 11-25-2020 [facility] Farxiga 10 mg- Last filled 12-17-2020 30 DS [facility] Trulicity 1.5 ml- Last filled 12-11-2020 [facility]  Any gaps in medications fill history? No  Huey Romans [facility] Clinical Pharmacist Assistant [phone]

## 2021-01-03 ENCOUNTER — Ambulatory Visit (INDEPENDENT_AMBULATORY_CARE_PROVIDER_SITE_OTHER): Payer: Self-pay

## 2021-01-03 DIAGNOSIS — E785 Hyperlipidemia, unspecified: Secondary | ICD-10-CM

## 2021-01-03 DIAGNOSIS — N182 Chronic kidney disease, stage 2 (mild): Secondary | ICD-10-CM

## 2021-01-03 DIAGNOSIS — E1122 Type 2 diabetes mellitus with diabetic chronic kidney disease: Secondary | ICD-10-CM

## 2021-01-03 DIAGNOSIS — Z794 Long term (current) use of insulin: Secondary | ICD-10-CM

## 2021-01-03 NOTE — Progress Notes (Signed)
Chronic Care Management Pharmacy Note  01/09/2021 Name:  Azoria Abbett MRN:  [id number] DOB:  Nov 17, 1950  Summary: Patient reports that she has been doing okay. She has some questions for me but would like for me to call her back.   Recommendations/Changes made from today's visit: Recommend patient receive COVID-19 booster vaccine   Plan: Continue current exercise regimen.  Continue taking medication every day at the same time each day.    Subjective: Shell Blanchette is an 70 y.o. year old female who is a primary patient of Glendale Chard, MD.  The CCM team was consulted for assistance with disease management and care coordination needs.  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   Engaged with patient by telephone for follow up visit in response to provider referral for pharmacy case management and/or care coordination services.   Consent to Services:  The patient was given information about Chronic Care Management services, agreed to services, and gave verbal consent prior to initiation of services.  Please see initial visit note for detailed documentation.   Patient Care Team: Glendale Chard, MD as PCP - General (Internal Medicine) Minus Breeding, MD as PCP - Cardiology (Cardiology)  Recent office visits: 09/27/2020 PCP OV  Recent consult visits: 09/11/2020 Physical therapy OV 08/30/2020 [facility] visits: None in previous 6 months   Objective:  Lab Results  Component Value Date   CREATININE 0.76 01/08/2021   BUN 9 01/08/2021   GFRNONAA 75 07/04/2020   GFRAA 87 07/04/2020   NA 145 (H) 01/08/2021   K 5.1 01/08/2021   CALCIUM 9.7 01/08/2021   CO2 27 01/08/2021   GLUCOSE 157 (H) 01/08/2021    Lab Results  Component Value Date/Time   HGBA1C 7.3 (H) 01/08/2021 10:55 AM   HGBA1C 7.5 (H) 09/27/2020 12:15 PM   MICROALBUR 80 03/29/2020 11:31 AM   MICROALBUR 30 03/23/2019 11:44 AM    Last diabetic  Eye exam:  Lab Results  Component Value Date/Time   HMDIABEYEEXA No Retinopathy 11/14/2020 12:00 AM    Last diabetic Foot exam: No results found for: HMDIABFOOTEX   Lab Results  Component Value Date   CHOL 156 07/04/2020   HDL 53 07/04/2020   LDLCALC 86 07/04/2020   TRIG 90 07/04/2020   CHOLHDL 2.9 07/04/2020    Hepatic Function Latest Ref Rng & Units 09/27/2020 07/04/2020 05/07/2020  Total Protein 6.0 - 8.5 g/dL 7.4 - 7.5  Albumin 3.8 - 4.8 g/dL 4.5 - 4.6  AST 0 - 40 IU/L 17 - 19  ALT 0 - 32 IU/L 11 16 14   Alk Phosphatase 44 - 121 IU/L 169(H) - 156(H)  Total Bilirubin 0.0 - 1.2 mg/dL 0.3 - 0.3    Lab Results  Component Value Date/Time   TSH 0.467 09/19/2014 04:00 PM   TSH 0.710 10/30/2009 10:08 PM   FREET4 1.30 05/04/2009 06:20 AM    CBC Latest Ref Rng & Units 05/07/2020 05/05/2019 03/23/2019  WBC 3.4 - 10.8 x10E3/uL 6.3 6.5 6.5  Hemoglobin 11.1 - 15.9 g/dL 12.3 12.3 12.7  Hematocrit 34.0 - 46.6 % 39.0 38.7 41.1  Platelets 150 - 450 x10E3/uL 268 288 265    Lab Results  Component Value Date/Time   VD25OH 32.5 03/23/2019 10:48 AM   VD25OH 28.8 03/17/2018 12:00 AM    Clinical ASCVD: Yes  The 10-year ASCVD risk score Mikey Bussing DC Jr., et al., 2013) is: 20.3%   Values used to calculate  the score:     Age: 70 years     Sex: Female     Is Non-Hispanic African American: Yes     Diabetic: Yes     Tobacco smoker: No     Systolic Blood Pressure: 161 mmHg     Is BP treated: Yes     HDL Cholesterol: 53 mg/dL     Total Cholesterol: 156 mg/dL    Depression screen [facility] 2/9 01/08/2021 09/27/2020 07/04/2020  Decreased Interest 0 0 0  Down, Depressed, Hopeless 0 0 0  PHQ - 2 Score 0 0 0  Altered sleeping - - -  Tired, decreased energy - - -  Change in appetite - - -  Feeling bad or failure about yourself  - - -  Trouble concentrating - - -  Moving slowly or fidgety/restless - - -  Suicidal thoughts - - -  PHQ-9 Score - - -  Difficult doing work/chores - - -  Some recent data  might be hidden     Social History   Tobacco Use  Smoking Status Former   Packs/day: 0.50   Years: 30.00   Pack years: 15.00   Types: Cigarettes   Quit date: 12/20/2013   Years since quitting: 7.0  Smokeless Tobacco Never   BP Readings from Last 3 Encounters:  01/08/21 124/76  09/27/20 132/80  07/04/20 122/80   Pulse Readings from Last 3 Encounters:  01/08/21 76  09/27/20 69  07/04/20 77   Wt Readings from Last 3 Encounters:  01/08/21 185 lb 6.4 oz (84.1 kg)  09/27/20 190 lb (86.2 kg)  07/04/20 190 lb (86.2 kg)   BMI Readings from Last 3 Encounters:  01/08/21 31.82 kg/m  09/27/20 32.61 kg/m  07/04/20 32.61 kg/m    Assessment/Interventions: Review of patient past medical history, allergies, medications, health status, including review of consultants reports, laboratory and other test data, was performed as part of comprehensive evaluation and provision of chronic care management services.   SDOH:  (Social Determinants of Health) assessments and interventions performed: No  SDOH Screenings   Alcohol Screen: Not on file  Depression (PHQ2-9): Low Risk    PHQ-2 Score: 0  Financial Resource Strain: Low Risk    Difficulty of Paying Living Expenses: Not hard at all  Food Insecurity: No Food Insecurity   Worried About Charity fundraiser in the Last Year: Never true   Ran Out of Food in the Last Year: Never true  Housing: Not on file  Physical Activity: Inactive   Days of Exercise per Week: 0 days   Minutes of Exercise per Session: 0 min  Social Connections: Not on file  Stress: No Stress Concern Present   Feeling of Stress : Not at all  Tobacco Use: Medium Risk   Smoking Tobacco Use: Former   Smokeless Tobacco Use: Never  Transportation Needs: No Transportation Needs   Lack of Transportation (Medical): No   Lack of Transportation (Non-Medical): No    CCM Care Plan  Allergies  Allergen Reactions   Fish Allergy     Cod   Latex Rash    Medications  Reviewed Today     Reviewed by Glendale Chard, MD (Physician) on 01/08/21 at 1001  Med List Status: <None>   Medication Order Taking? Sig Documenting Provider Last Dose Status Informant  Accu-Chek FastClix Lancets MISC [id number] Yes CHECK BLOOD SUGAR THREE TIMES DAILY dx: e11.65 Glendale Chard, MD Taking Active   amLODipine (NORVASC) 10 MG tablet [id number] Yes 1  tablet [provider] Taking Active   Ascorbic Acid (VITAMIN C) 500 MG CHEW [id number] Yes 1 tablet [provider] Taking Active   Cholecalciferol ([facility] VIT D 5000 HIGH-POTENCY PO) [id number] Yes Take 1 tablet by mouth daily. [provider] Taking Active   Chromium Picolinate 200 MCG TABS [id number] Yes 1 tablet [provider] Taking Active   Cobalamin Combinations (VITAMIN B12-FOLIC ACID) 256-389 MCG TABS [id number] Yes 1 tab [provider] Taking Active   Coenzyme Q10 (COQ-10) 100 MG CAPS [id number] Yes Take 1 tablet by mouth daily.  [provider] Taking Active   Continuous Blood Gluc Receiver (FREESTYLE LIBRE 2 READER) DEVI [id number] Yes Use as directed to check blood sugars 3 times per day dx: e11.65 Glendale Chard, MD Taking Active   Cyanocobalamin (VITAMIN B-12 PO) [id number] Yes Take 500 mcg by mouth.  [provider] Taking Active   FARXIGA 10 MG TABS tablet [id number] Yes TAKE 1 TABLET(10 MG) BY MOUTH DAILY BEFORE AND Adella Nissen, MD Taking Active   FLUZONE HIGH-DOSE QUADRIVALENT 0.7 ML SUSY [id number] Yes  [provider] Taking Active   gabapentin (NEURONTIN) 300 MG capsule [id number] Yes 1 tablet [provider] Taking Active   glucosamine-chondroitin 500-400 MG tablet [id number] Yes Take 1 tablet by mouth daily. Reported on 12/17/2015 [provider] Taking Active   glucose blood (ONETOUCH VERIO) test strip [id number] Yes CHECK BLOOD SUGAR THREE TIMES DAILY dx: e11.65 Glendale Chard, MD Taking Active   insulin aspart (FIASP  FLEXTOUCH) 100 UNIT/ML FlexTouch Pen [id number] Yes Use sliding scale as directed three times daily. Inject 2 units for BG 150-199, 4 units for BG 200-249, 6 units for BG 250-299, 8 units for BG 300-340, 10 units for BG>350, BG>400 call MD Glendale Chard, MD Taking Active   insulin degludec (TRESIBA FLEXTOUCH) 200 UNIT/ML FlexTouch Pen [id number] Yes Inject 26 Units into the skin daily. Max dose is 80 units Glendale Chard, MD Taking Active   insulin glargine (LANTUS SOLOSTAR) 100 UNIT/ML Solostar Pen [id number] Yes 16 units [provider] Taking Active   insulin lispro (HUMALOG KWIKPEN) 100 UNIT/ML KwikPen [id number] Yes Inject subcutaneously 2 units if sugars are 150 - 199, 4 units 200 -249, 6 units 250- 299, 8 units 300 - 349, 10 units >350 Glendale Chard, MD Taking Active   Insulin Pen Needle (BD PEN NEEDLE NANO 2ND GEN) 32G X 4 MM MISC [id number] Yes USE TO INJECT INSULIN DAILY Glendale Chard, MD Taking Active   MAGNESIUM OXIDE PO [id number] Yes Take 500 mg by mouth daily.  [provider] Taking Active Self  metoprolol tartrate (LOPRESSOR) 50 MG tablet [id number] Yes TAKE 1 TABLET BY MOUTH TWICE DAILY Glendale Chard, MD Taking Active   Misc Natural Products (NEURIVA) CAPS [id number] Yes Take 1 capsule by mouth daily. [provider] Taking Active Self  nystatin (NYSTATIN) powder [id number] Yes Apply topically 3 (three) times daily. As needed Glendale Chard, MD Taking Active   omeprazole (PRILOSEC) 20 MG capsule [id number] Yes TAKE ONE CAPSULE BY MOUTH DAILY. Glendale Chard, MD Taking Active   OneTouch Delica Lancets 79U [place] [id number] Yes USE AS DIRECTED TO CHECK BLOOD SUGAR THREE TIMES DAILY Glendale Chard, MD Taking Active   pregabalin (LYRICA) 50 MG capsule [id number] Yes 1 capsule [provider] Taking Active   PRESCRIPTION MEDICATION [id number] Yes 15 mg pain medication unsure of name take as needed prescribe by orthopedic [provider] Taking Active Self   Psyllium (METAMUCIL FIBER PO) [id number] Yes  Take by mouth. [provider] Taking Active   rosuvastatin (CRESTOR) 20 MG tablet [id number] Yes TAKE 1 TABLET(20 MG) BY MOUTH DAILY Hochrein, James, MD Taking Active   traMADol (ULTRAM) 50 MG tablet [id number] Yes TAKE 1 TO 2 TABLETS BY MOUTH 2 TO 3 TIMES DAILY WHEN NECESSARY [provider] Taking Active   traZODone (DESYREL) 50 MG tablet [id number] Yes TAKE 1 TABLET BY MOUTH EVERY NIGHT AT BEDTIME Glendale Chard, MD Taking Active   TRULICITY 1.5 XJ/8.8TG [place] [facility] [id number] Yes ADMINISTER 1.5 MG UNDER THE SKIN 1 TIME A Dayna Barker, MD Taking Active   Vitamin A 3 MG (10000 UT) TABS [id number] Yes Take 1 tablet by mouth daily. [provider] Taking Active Self  vitamin C (ASCORBIC ACID) 500 MG tablet [id number] Yes Take 1,000 mg by mouth 2 (two) times daily.  [provider] Taking Active Self  Vitamin D, Ergocalciferol, (DRISDOL) 1.25 MG (50000 UNIT) CAPS capsule [id number] Yes TAKE 1 CAPSULE BY MOUTH EVERY 7 DAYS Glendale Chard, MD Taking Active   vitamin E 600 UNIT capsule [id number] Yes Take 400 Units by mouth daily.  [provider] Taking Active Self            Patient Active Problem List   Diagnosis Date Noted   Parenchymal renal hypertension 07/04/2020   Educated about COVID-19 virus infection 03/25/2020   OSA and COPD overlap syndrome ([place]) 03/14/2019   Snoring 02/17/2019   Peripheral arterial disease ([place]) 01/26/2019   Hepatitis C carrier ([place]) 01/26/2019   Hypertensive heart and renal disease 08/19/2018   Class 1 obesity due to excess calories with serious comorbidity and body mass index (BMI) of 33.0 to 33.9 in adult 08/19/2018   Paresthesia of left upper extremity 06/29/2018   Atherosclerosis of native coronary artery of native heart with angina pectoris ([place]) 04/09/2018   Dyslipidemia 04/09/2018   Primary osteoarthritis of both feet 11/05/2017   Primary osteoarthritis of both hands  10/30/2017   Primary osteoarthritis of both knees 10/30/2017   Primary insomnia 10/30/2017   Family history of lupus erythematosus 10/30/2017   Dyspnea on exertion 12/10/2016   Type 2 diabetes mellitus with stage 2 chronic kidney disease, with long-term current use of insulin ([place]) 09/21/2014   Acute cystitis 09/21/2014   Hyperglycemia 09/19/2014   Bradycardia 09/19/2014   Hyponatremia 09/19/2014   Elevated alkaline phosphatase level 09/19/2014   Acute kidney injury ([place])    Essential hypertension, benign 07/05/2008   Coronary atherosclerosis 07/05/2008   TOBACCO USER 07/03/2008    Immunization History  Administered Date(s) Administered   Fluad Quad(high Dose 65+) 03/04/2019   Influenza, High Dose Seasonal PF 03/21/2017, 03/11/2018   Influenza-Unspecified 03/07/2020   PFIZER(Purple Top)SARS-COV-2 Vaccination 07/30/2019, 08/20/2019, 04/16/2020   Pneumococcal Conjugate-13 10/23/2016   Zoster Recombinat (Shingrix) 01/25/2019, 03/30/2019    Conditions to be addressed/monitored:  Hypertension and Diabetes  Care Plan : [place]  Updates made by Mayford Knife, [place] since 01/09/2021 12:00 AM     Problem: HTN, DM II   Priority: High     Long-Range Goal: Disease Management   Note:    Current Barriers:  Unable to independently monitor therapeutic efficacy  Pharmacist Clinical Goal(s):  Patient will achieve adherence to monitoring guidelines and medication adherence to achieve therapeutic efficacy through collaboration with PharmD and provider.   Interventions: 1:1 collaboration with Glendale Chard, MD regarding development and update of comprehensive plan of care as evidenced by provider attestation and co-signature Inter-disciplinary care team collaboration (  see longitudinal plan of care) Comprehensive medication review performed; medication list updated in electronic medical record  Hypertension (BP goal <130/80) -Controlled -Current treatment: Amlodipine  10 mg tablet once per day Metoprolol Tartrate 50 mg tablet twice per day  -Medications previously tried: olmesartan 40 mg tablet, o lmesartan - hydrochlorothiazide 40-25 mg tablet,  -Current home readings: current home BP readings not given -Current dietary habits: please see diabetes for more details  -Current exercise habits: She has an early morning mall pass, she took a a picture and she walks with friends before opening time for two days week, the stores do not open until 11 so she walks all the floors and talks with her friends, she is constantly walking to the mail box and she also walks through the stores when she is shopping -Denies hypotensive/hypertensive symptoms -Educated on BP goals and benefits of medications for prevention of heart attack, stroke and kidney damage; Symptoms of hypotension and importance of maintaining adequate hydration; -Counseled to monitor BP at home at least three times per week, document, and provide log at future appointments -Recommended to continue current medication  Diabetes (A1c goal <7%) -Controlled -Current medications: Farxiga 10 mg tablet once per day  Insulin Lispro: slide Tresiba: inject 26 units daily, max dose is 80 units  Insulin Glargine: 16 units daily Trulicity 1.5 mg once a week on Saturday  -Medications previously tried: Insulin Aspart: sliding scale, glimepiride 6m, metformin 500 mg, Janumet 50-1000 mg  -Current home glucose readings -she is checking her BS at least 4 times per day, using her Freestyle monitor, she reports her readings are staying in the green zone.  -Denies hypoglycemic/hyperglycemic symptoms -Current meal patterns:  breakfast: smoothie for breakfast with protein powder, fruit and low-fat milk(2%)   lunch: patient d dinner: usually meat, vegetables and starch. It varies and because it is so hot she might just have a salad and protein.  snacks: grapes, she enjoys fruit drinks: she drinks plenty of water, she  keeps a water jug with her at all times, and she drinks water all day while she is at home, she does not drink any soda and she does not drink juice.  -Current exercise: please see hypertension  -Educated on A1c and blood sugar goals; Exercise goal of 150 minutes per week; -Counseled to check feet daily and get yearly eye exams -Recommended to continue current medication    Patient Goals/Self-Care Activities Patient will:  - take medications as prescribed  Follow Up Plan: The patient has been provided with contact information for the care management team and has been advised to call with any health related questions or concerns.       Medication Assistance: None required.  Patient affirms current coverage meets needs.  Compliance/Adherence/Medication fill history: Care Gaps: COVID-19 second booster  Star-Rating Drugs: Farxiga 10 mg tablet Rosuvastatin 20 mg tablet Trulicity 1.5 mg   [facility]  [facility]Phone: [phone]: [phone] [place]Phone: [phone]: [phone] Uses pill box? Yes - she has 4 pill boxes and her medications are seperated between morning and evening  Pt endorses 98% compliance  We discussed: Benefits of medication synchronization, packaging and delivery as well as enhanced pharmacist oversight with [facility]. Patient decided  to: Continue current medication management strategy  Care Plan and Follow Up Patient Decision:  Patient agrees to Care Plan and Follow-up.  Plan: The patient has been provided with contact information for the care management team and has been advised to call with any health related questions or concerns.   Orlando Penner, PharmD Clinical  Pharmacist [facility] [phone]

## 2021-01-06 ENCOUNTER — Encounter: Payer: Self-pay | Admitting: Internal Medicine

## 2021-01-07 ENCOUNTER — Telehealth: Payer: Self-pay

## 2021-01-07 NOTE — Telephone Encounter (Signed)
The patient was scheduled an appt for tomorrow.  The pt said she had to stop the farxiga because it was causing her to have diarrhea.

## 2021-01-08 ENCOUNTER — Encounter: Payer: Self-pay | Admitting: Internal Medicine

## 2021-01-08 ENCOUNTER — Ambulatory Visit (INDEPENDENT_AMBULATORY_CARE_PROVIDER_SITE_OTHER): Payer: Medicare Other | Admitting: Internal Medicine

## 2021-01-08 ENCOUNTER — Other Ambulatory Visit: Payer: Self-pay

## 2021-01-08 VITALS — BP 124/76 | HR 76 | Temp 99.5°F | Ht 64.0 in | Wt 185.4 lb

## 2021-01-08 DIAGNOSIS — E1122 Type 2 diabetes mellitus with diabetic chronic kidney disease: Secondary | ICD-10-CM | POA: Diagnosis not present

## 2021-01-08 DIAGNOSIS — M25511 Pain in right shoulder: Secondary | ICD-10-CM

## 2021-01-08 DIAGNOSIS — Z794 Long term (current) use of insulin: Secondary | ICD-10-CM

## 2021-01-08 DIAGNOSIS — Z79899 Other long term (current) drug therapy: Secondary | ICD-10-CM

## 2021-01-08 DIAGNOSIS — I129 Hypertensive chronic kidney disease with stage 1 through stage 4 chronic kidney disease, or unspecified chronic kidney disease: Secondary | ICD-10-CM | POA: Diagnosis not present

## 2021-01-08 DIAGNOSIS — L304 Erythema intertrigo: Secondary | ICD-10-CM | POA: Diagnosis not present

## 2021-01-08 DIAGNOSIS — N182 Chronic kidney disease, stage 2 (mild): Secondary | ICD-10-CM

## 2021-01-08 DIAGNOSIS — E6609 Other obesity due to excess calories: Secondary | ICD-10-CM

## 2021-01-08 DIAGNOSIS — Z6831 Body mass index (BMI) 31.0-31.9, adult: Secondary | ICD-10-CM

## 2021-01-08 NOTE — Progress Notes (Signed)
I,Katawbba Wiggins,acting as a Education administrator for [name], MD.,have documented all relevant documentation on the behalf of [name], MD,as directed by  [name], MD while in the presence of [name], MD.  This visit occurred during the SARS-CoV-2 public health emergency.  Safety protocols were in place, including screening questions prior to the visit, additional usage of staff PPE, and extensive cleaning of exam room while observing appropriate contact time as indicated for disinfecting solutions.  Subjective:     Patient ID: [name] [name] , female    DOB: 03-May-1951 , 70 y.o.   MRN: [id number]   Chief Complaint  Patient presents with   Diabetes   Hypertension     HPI  She is here today for diabetes/BP check. She reports compliance with meds. She denies headaches, chest pain and shortness of breath. She reports feeling good.   Diabetes She presents for her follow-up diabetic visit. She has type 2 diabetes mellitus. Her disease course has been improving. Pertinent negatives for hypoglycemia include no dizziness or headaches. Pertinent negatives for diabetes include no blurred vision, no chest pain, no fatigue, no polydipsia, no polyphagia and no polyuria. There are no hypoglycemic complications. Risk factors for coronary artery disease include diabetes mellitus, dyslipidemia, hypertension and post-menopausal. Current diabetic treatment includes insulin injections. She is compliant with treatment most of the time. She is following a diabetic diet. She participates in exercise intermittently. Eye exam is current.  Hypertension This is a chronic problem. The current episode started more than 1 year ago. The problem has been gradually improving since onset. The problem is controlled. Pertinent negatives include no blurred vision, chest pain, headaches or palpitations. Past treatments include angiotensin blockers. The current treatment provides moderate improvement. Compliance  problems include exercise and diet.  Hypertensive end-organ damage includes kidney disease. Identifiable causes of hypertension include chronic renal disease.    Past Medical History:  Diagnosis Date   Anemia    Carpal tunnel syndrome    Coronary artery disease    DES to ostial RCA 2005   Diabetes mellitus without complication (HCC)    No longer taking meds   Dyslipidemia    Hypertension    Osteoarthritis    PAD (peripheral artery disease) (HCC)    Sleep apnea      Family History  Problem Relation Age of Onset   CAD Father 15   Heart attack Father    CAD Mother 65   Heart attack Mother    CAD Brother    Cancer Brother        prostate   CAD Sister    Cancer Sister        breast   CAD Brother      Current Outpatient Medications:    Accu-Chek FastClix Lancets MISC, CHECK BLOOD SUGAR THREE TIMES DAILY dx: e11.65, Disp: 100 each, Rfl: 2   amLODipine (NORVASC) 10 MG tablet, 1 tablet, Disp: , Rfl:    Ascorbic Acid (VITAMIN C) 500 MG CHEW, 1 tablet, Disp: , Rfl:    Cholecalciferol (CVS VIT D 5000 HIGH-POTENCY PO), Take 1 tablet by mouth daily., Disp: , Rfl:    Chromium Picolinate 200 MCG TABS, 1 tablet, Disp: , Rfl:    Coenzyme Q10 (COQ-10) 100 MG CAPS, Take 1 tablet by mouth daily. , Disp: , Rfl:    Continuous Blood Gluc Receiver (FREESTYLE LIBRE 2 READER) DEVI, Use as directed to check blood sugars 3 times per day dx: e11.65, Disp: 1 each, Rfl:  1   Cyanocobalamin (VITAMIN B-12 PO), Take 500 mcg by mouth. , Disp: , Rfl:    FARXIGA 10 MG TABS tablet, TAKE 1 TABLET(10 MG) BY MOUTH DAILY BEFORE AND BREAKFAST, Disp: 30 tablet, Rfl: 2   FLUZONE HIGH-DOSE QUADRIVALENT 0.7 ML SUSY, , Disp: , Rfl:    gabapentin (NEURONTIN) 300 MG capsule, 1 tablet, Disp: , Rfl:    glucosamine-chondroitin 500-400 MG tablet, Take 1 tablet by mouth daily. Reported on 12/17/2015, Disp: , Rfl:    glucose blood (ONETOUCH VERIO) test strip, CHECK BLOOD SUGAR THREE TIMES DAILY dx: e11.65, Disp: 150 each, Rfl:  11   insulin degludec (TRESIBA FLEXTOUCH) 200 UNIT/ML FlexTouch Pen, Inject 26 Units into the skin daily. Max dose is 80 units, Disp: 9 mL, Rfl: 1   insulin lispro (HUMALOG KWIKPEN) 100 UNIT/ML KwikPen, Inject subcutaneously 2 units if sugars are 150 - 199, 4 units 200 -249, 6 units 250- 299, 8 units 300 - 349, 10 units >350, Disp: 15 mL, Rfl: 0   Insulin Pen Needle (BD PEN NEEDLE NANO 2ND GEN) 32G X 4 MM MISC, USE TO INJECT INSULIN DAILY, Disp: 100 each, Rfl: 2   MAGNESIUM OXIDE PO, Take 500 mg by mouth daily. , Disp: , Rfl:    metoprolol tartrate (LOPRESSOR) 50 MG tablet, TAKE 1 TABLET BY MOUTH TWICE DAILY, Disp: 180 tablet, Rfl: 4   Misc Natural Products (NEURIVA) CAPS, Take 1 capsule by mouth daily., Disp: , Rfl:    nystatin (NYSTATIN) powder, Apply topically 3 (three) times daily. As needed, Disp: 60 g, Rfl: 0   omeprazole (PRILOSEC) 20 MG capsule, TAKE ONE CAPSULE BY MOUTH DAILY., Disp: 90 capsule, Rfl: 1   OneTouch Delica Lancets 54T MISC, USE AS DIRECTED TO CHECK BLOOD SUGAR THREE TIMES DAILY, Disp: 100 each, Rfl: 0   pregabalin (LYRICA) 50 MG capsule, 1 capsule, Disp: , Rfl:    PRESCRIPTION MEDICATION, 15 mg pain medication unsure of name take as needed prescribe by orthopedic, Disp: , Rfl:    Psyllium (METAMUCIL FIBER PO), Take by mouth., Disp: , Rfl:    rosuvastatin (CRESTOR) 20 MG tablet, TAKE 1 TABLET(20 MG) BY MOUTH DAILY, Disp: 90 tablet, Rfl: 1   traMADol (ULTRAM) 50 MG tablet, TAKE 1 TO 2 TABLETS BY MOUTH 2 TO 3 TIMES DAILY WHEN NECESSARY, Disp: , Rfl:    traZODone (DESYREL) 50 MG tablet, TAKE 1 TABLET BY MOUTH EVERY NIGHT AT BEDTIME, Disp: 90 tablet, Rfl: 1   TRULICITY 1.5 GY/5.6LS SOPN, ADMINISTER 1.5 MG UNDER THE SKIN 1 TIME A WEEK, Disp: 6 mL, Rfl: 3   Vitamin A 3 MG (10000 UT) TABS, Take 1 tablet by mouth daily., Disp: , Rfl:    vitamin C (ASCORBIC ACID) 500 MG tablet, Take 1,000 mg by mouth 2 (two) times daily. , Disp: , Rfl:    Vitamin D, Ergocalciferol, (DRISDOL) 1.25 MG  (50000 UNIT) CAPS capsule, TAKE 1 CAPSULE BY MOUTH EVERY 7 DAYS, Disp: 12 capsule, Rfl: 1   vitamin E 600 UNIT capsule, Take 400 Units by mouth daily. , Disp: , Rfl:    Allergies  Allergen Reactions   Fish Allergy     Cod   Latex Rash     Review of Systems  Constitutional: Negative.  Negative for fatigue.  Eyes:  Negative for blurred vision.  Respiratory: Negative.    Cardiovascular: Negative.  Negative for chest pain and palpitations.  Gastrointestinal: Negative.   Endocrine: Negative for polydipsia, polyphagia and polyuria.  Musculoskeletal:  She c/o right shoulder pain. She denies fall/trauma. States it started bother her two weeks ago.   Skin:  Positive for rash.       She c/o rash in her groin. States it started w/ [place]. Denies vaginal discharge. States her skin is peeling. Also adds that she felt "wiped out".   Neurological: Negative.  Negative for dizziness and headaches.  Psychiatric/Behavioral: Negative.    All other systems reviewed and are negative.   Today's Vitals   01/08/21 0941  BP: 124/76  Pulse: 76  Temp: 99.5 F (37.5 C)  TempSrc: Oral  Weight: 185 lb 6.4 oz (84.1 kg)  Height: 5' 4"  (1.626 m)  PainSc: 5   PainLoc: Shoulder   Body mass index is 31.82 kg/m.  Wt Readings from Last 3 Encounters:  01/08/21 185 lb 6.4 oz (84.1 kg)  09/27/20 190 lb (86.2 kg)  07/04/20 190 lb (86.2 kg)    BP Readings from Last 3 Encounters:  01/08/21 124/76  09/27/20 132/80  07/04/20 122/80    Objective:  Physical Exam Vitals and nursing note reviewed.  Constitutional:      Appearance: Normal appearance.  HENT:     Head: Normocephalic and atraumatic.     Nose:     Comments: Masked     Mouth/Throat:     Comments: Masked  Eyes:     Extraocular Movements: Extraocular movements intact.  Cardiovascular:     Rate and Rhythm: Normal rate and regular rhythm.     Heart sounds: Normal heart sounds.  Pulmonary:     Effort: Pulmonary effort is normal.      Breath sounds: Normal breath sounds.  Musculoskeletal:        General: Tenderness present.     Cervical back: Normal range of motion.  Skin:    General: Skin is warm.  Neurological:     General: No focal deficit present.     Mental Status: She is alert.  Psychiatric:        Mood and Affect: Mood normal.        Behavior: Behavior normal.        Assessment And Plan:     1. Type 2 diabetes mellitus with stage 2 chronic kidney disease, with long-term current use of insulin (Sh[place]ira) Comments: She is no longer taking [place]. I will consider using Jardiance once reviewing her labs. She was also instructed how to use [place] app on her new iPhone.  - BMP8+EGFR - Hemoglobin A1c  2. Parenchymal renal hypertension, stage 1 through stage 4 or unspecified chronic kidney disease Comments: Chornic, well controlled. She is encouraged to follow low sodium diet. I will not make any changes at this time.  - BMP8+EGFR  3. Intertrigo Comments: She is advised to apply Gold Bond cream to her groin twice daily prn. She will let me know if her sx persist.   4. Acute pain of right shoulder Comments: She declines to have Ortho eval/xrays at this time. She is advised to apply topical pain cream to shoulder bid. She wil let me know if her sx persist.   5. Class 1 obesity due to excess calories with serious comorbidity and body mass index (BMI) of 31.0 to 31.9 in adult Comments: She is encouraged to strive to lose 7-10 pounds to decrease cardiac risk.   6. Drug therapy - Vitamin B12   Patient was given opportunity to ask questions. Patient verbalized understanding of the plan and was able to repeat key elements of the  plan. All questions were answered to their satisfaction.   I, [name], MD, have reviewed all documentation for this visit. The documentation on 01/08/21 for the exam, diagnosis, procedures, and orders are all accurate and complete.   IF YOU HAVE BEEN REFERRED TO A SPECIALIST, IT MAY  TAKE 1-2 WEEKS TO SCHEDULE/PROCESS THE REFERRAL. IF YOU HAVE NOT HEARD FROM US/SPECIALIST IN TWO WEEKS, PLEASE GIVE [place] A CALL AT [phone] X [phone].   THE PATIENT IS ENCOURAGED TO PRACTICE SOCIAL DISTANCING DUE TO THE COVID-19 PANDEMIC.

## 2021-01-09 DIAGNOSIS — Z20822 Contact with and (suspected) exposure to covid-19: Secondary | ICD-10-CM | POA: Diagnosis not present

## 2021-01-09 DIAGNOSIS — E119 Type 2 diabetes mellitus without complications: Secondary | ICD-10-CM | POA: Diagnosis not present

## 2021-01-09 DIAGNOSIS — H04123 Dry eye syndrome of bilateral lacrimal glands: Secondary | ICD-10-CM | POA: Diagnosis not present

## 2021-01-09 DIAGNOSIS — H43812 Vitreous degeneration, left eye: Secondary | ICD-10-CM | POA: Diagnosis not present

## 2021-01-09 DIAGNOSIS — H25811 Combined forms of age-related cataract, right eye: Secondary | ICD-10-CM | POA: Diagnosis not present

## 2021-01-09 LAB — HEMOGLOBIN A1C
Est. average glucose Bld gHb Est-mCnc: 163 mg/dL
Hgb A1c MFr Bld: 7.3 % — ABNORMAL HIGH (ref 4.8–5.6)

## 2021-01-09 LAB — VITAMIN B12: Vitamin B-12: 1049 pg/mL (ref 232–1245)

## 2021-01-09 LAB — BMP8+EGFR
BUN/Creatinine Ratio: 12 (ref 12–28)
BUN: 9 mg/dL (ref 8–27)
CO2: 27 mmol/L (ref 20–29)
Calcium: 9.7 mg/dL (ref 8.7–10.3)
Chloride: 105 mmol/L (ref 96–106)
Creatinine, Ser: 0.76 mg/dL (ref 0.57–1.00)
Glucose: 157 mg/dL — ABNORMAL HIGH (ref 65–99)
Potassium: 5.1 mmol/L (ref 3.5–5.2)
Sodium: 145 mmol/L — ABNORMAL HIGH (ref 134–144)
eGFR: 84 mL/min/{1.73_m2} (ref >59)

## 2021-01-09 NOTE — Patient Instructions (Signed)
Visit Information It was great speaking with you today!  Please let me know if you have any questions about our visit.   Goals Addressed             This Visit's Progress    Manage My Medicine       Timeframe:  Long-Range Goal Priority:  High Start Date:        01/09/2021                     Expected End Date:                       Follow Up Date 05/09/2021    - call for medicine refill 2 or 3 days before it runs out - call if I am sick and can't take my medicine - keep a list of all the medicines I take; vitamins and herbals too - use a pillbox to sort medicine - use an alarm clock or phone to remind me to take my medicine    Why is this important?   These steps will help you keep on track with your medicines.   Notes:  Please call if you have any questions.         Patient Care Plan: CCM Pharmacy Care Plan     Problem Identified: HTN, DM II   Priority: High     Long-Range Goal: Disease Management   Note:    Current Barriers:  Unable to independently monitor therapeutic efficacy  Pharmacist Clinical Goal(s):  Patient will achieve adherence to monitoring guidelines and medication adherence to achieve therapeutic efficacy through collaboration with PharmD and provider.   Interventions: 1:1 collaboration with [name], MD regarding development and update of comprehensive plan of care as evidenced by provider attestation and co-signature Inter-disciplinary care team collaboration (see longitudinal plan of care) Comprehensive medication review performed; medication list updated in electronic medical record  Hypertension (BP goal <130/80) -Controlled -Current treatment: Amlodipine 10 mg tablet once per day Metoprolol Tartrate 50 mg tablet twice per day  -Medications previously tried: olmesartan 40 mg tablet, o lmesartan - hydrochlorothiazide 40-25 mg tablet,  -Current home readings: current home BP readings not given -Current dietary habits: please see  diabetes for more details  -Current exercise habits: She has an early morning mall pass, she took a a picture and she walks with friends before opening time for two days week, the stores do not open until 11 so she walks all the floors and talks with her friends, she is constantly walking to the mail box and she also walks through the stores when she is shopping -Denies hypotensive/hypertensive symptoms -Educated on BP goals and benefits of medications for prevention of heart attack, stroke and kidney damage; Symptoms of hypotension and importance of maintaining adequate hydration; -Counseled to monitor BP at home at least three times per week, document, and provide log at future appointments -Recommended to continue current medication  Diabetes (A1c goal <7%) -Controlled -Current medications: Farxiga 10 mg tablet once per day  Insulin Lispro: slide Tresiba: inject 26 units daily, max dose is 80 units  Insulin Glargine: 16 units daily Trulicity 1.5 mg once a week on Saturday  -Medications previously tried: Insulin Aspart: sliding scale, glimepiride 2mg , metformin 500 mg, Janumet 50-1000 mg  -Current home glucose readings -she is checking her BS at least 4 times per day, using her Freestyle monitor, she reports her readings are staying in the green zone.  -Denies  hypoglycemic/hyperglycemic symptoms -Current meal patterns:  breakfast: smoothie for breakfast with protein powder, fruit and low-fat milk(2%)   lunch: patient d dinner: usually meat, vegetables and starch. It varies and because it is so hot she might just have a salad and protein.  snacks: grapes, she enjoys fruit drinks: she drinks plenty of water, she keeps a water jug with her at all times, and she drinks water all day while she is at home, she does not drink any soda and she does not drink juice.  -Current exercise: please see hypertension  -Educated on A1c and blood sugar goals; Exercise goal of 150 minutes per  week; -Counseled to check feet daily and get yearly eye exams -Recommended to continue current medication    Patient Goals/Self-Care Activities Patient will:  - take medications as prescribed  Follow Up Plan: The patient has been provided with contact information for the care management team and has been advised to call with any health related questions or concerns.       Patient agreed to services and verbal consent obtained.   The patient verbalized understanding of instructions, educational materials, and care plan provided today and agreed to receive a mailed copy of patient instructions, educational materials, and care plan.   Cherylin Mylar, PharmD Clinical Pharmacist [facility] [phone]

## 2021-01-15 DIAGNOSIS — Z20822 Contact with and (suspected) exposure to covid-19: Secondary | ICD-10-CM | POA: Diagnosis not present

## 2021-01-17 DIAGNOSIS — Z794 Long term (current) use of insulin: Secondary | ICD-10-CM | POA: Diagnosis not present

## 2021-01-17 DIAGNOSIS — E1165 Type 2 diabetes mellitus with hyperglycemia: Secondary | ICD-10-CM | POA: Diagnosis not present

## 2021-01-22 DIAGNOSIS — Z20822 Contact with and (suspected) exposure to covid-19: Secondary | ICD-10-CM | POA: Diagnosis not present

## 2021-01-24 ENCOUNTER — Telehealth: Payer: Self-pay

## 2021-01-24 ENCOUNTER — Encounter: Payer: Self-pay | Admitting: Internal Medicine

## 2021-01-24 NOTE — Telephone Encounter (Signed)
The pt states she has been using trulicity 1.5 weekly and farxiga 10 mg daily.

## 2021-01-27 DIAGNOSIS — Z794 Long term (current) use of insulin: Secondary | ICD-10-CM | POA: Diagnosis not present

## 2021-01-27 DIAGNOSIS — E119 Type 2 diabetes mellitus without complications: Secondary | ICD-10-CM | POA: Diagnosis not present

## 2021-01-28 ENCOUNTER — Telehealth: Payer: Self-pay | Admitting: Internal Medicine

## 2021-01-28 NOTE — Telephone Encounter (Signed)
Tried calling patient  no answer  Patient has appointment 04/25/21 for AWV.  I wanted to see if patient could come in sooner for appointment  Patient can have AWV calendar per [facility]

## 2021-01-30 DIAGNOSIS — Z20822 Contact with and (suspected) exposure to covid-19: Secondary | ICD-10-CM | POA: Diagnosis not present

## 2021-02-02 ENCOUNTER — Other Ambulatory Visit: Payer: Self-pay | Admitting: Internal Medicine

## 2021-02-08 DIAGNOSIS — Z1152 Encounter for screening for COVID-19: Secondary | ICD-10-CM | POA: Diagnosis not present

## 2021-02-14 ENCOUNTER — Other Ambulatory Visit: Payer: Self-pay | Admitting: Internal Medicine

## 2021-02-15 NOTE — Telephone Encounter (Signed)
Error

## 2021-02-16 DIAGNOSIS — Z794 Long term (current) use of insulin: Secondary | ICD-10-CM | POA: Diagnosis not present

## 2021-02-16 DIAGNOSIS — E1165 Type 2 diabetes mellitus with hyperglycemia: Secondary | ICD-10-CM | POA: Diagnosis not present

## 2021-02-19 ENCOUNTER — Other Ambulatory Visit: Payer: Self-pay | Admitting: Internal Medicine

## 2021-02-22 DIAGNOSIS — Z1152 Encounter for screening for COVID-19: Secondary | ICD-10-CM | POA: Diagnosis not present

## 2021-03-04 ENCOUNTER — Other Ambulatory Visit: Payer: Self-pay | Admitting: Cardiology

## 2021-03-06 DIAGNOSIS — Z1152 Encounter for screening for COVID-19: Secondary | ICD-10-CM | POA: Diagnosis not present

## 2021-03-07 ENCOUNTER — Telehealth: Payer: Self-pay | Admitting: Internal Medicine

## 2021-03-07 NOTE — Telephone Encounter (Signed)
Left message asking pt to call [phone]  I wanted to see if we could rescheduled patient awv to an earlier date  [facility] can do AWV calendar year

## 2021-03-14 ENCOUNTER — Other Ambulatory Visit: Payer: Self-pay

## 2021-03-14 ENCOUNTER — Telehealth: Payer: Self-pay

## 2021-03-14 ENCOUNTER — Ambulatory Visit (INDEPENDENT_AMBULATORY_CARE_PROVIDER_SITE_OTHER): Payer: Medicare Other | Admitting: Sports Medicine

## 2021-03-14 ENCOUNTER — Encounter: Payer: Self-pay | Admitting: Sports Medicine

## 2021-03-14 DIAGNOSIS — E119 Type 2 diabetes mellitus without complications: Secondary | ICD-10-CM

## 2021-03-14 DIAGNOSIS — L84 Corns and callosities: Secondary | ICD-10-CM

## 2021-03-14 NOTE — Chronic Care Management (AMB) (Signed)
Chronic Care Management Pharmacy Assistant   Name: [name] [name]  MRN: [id number] DOB: 12-21-1950  Reason for Encounter: Disease State/ Diabetes  Recent office visits:  01-08-2021 [name], MD. STOP Vitamin B-12, Flaxseed oil, Fiasp insulin, Lantus, Krill oil and [place]. A1C= 7.3. Glucose= 157, Sodium= 145.  Recent consult visits:  03-14-2021 Asencion Islam, DPM (podiatry). Diabetic foot exam  Hospital visits:  None in previous 6 months  Medications: Outpatient Encounter Medications as of 03/14/2021  Medication Sig   Accu-Chek FastClix Lancets MISC CHECK BLOOD SUGAR THREE TIMES DAILY dx: e11.65   amLODipine (NORVASC) 10 MG tablet 1 tablet   Ascorbic Acid (VITAMIN C) 500 MG CHEW 1 tablet   aspirin EC 81 MG tablet Take 81 mg by mouth daily. Swallow whole.   Cholecalciferol (CVS VIT D 5000 HIGH-POTENCY PO) Take 1 tablet by mouth daily.   Coenzyme Q10 (COQ-10) 100 MG CAPS Take 1 tablet by mouth daily.    Continuous Blood Gluc Receiver (FREESTYLE LIBRE 2 READER) DEVI Use as directed to check blood sugars 3 times per day dx: e11.65   Cyanocobalamin (VITAMIN B-12 PO) Take 500 mcg by mouth.   FARXIGA 10 MG TABS tablet TAKE 1 TABLET(10 MG) BY MOUTH DAILY BEFORE AND BREAKFAST   glucosamine-chondroitin 500-400 MG tablet Take 1 tablet by mouth daily. Reported on 12/17/2015   glucose blood (ONETOUCH VERIO) test strip CHECK BLOOD SUGAR THREE TIMES DAILY dx: e11.65   insulin lispro (HUMALOG KWIKPEN) 100 UNIT/ML KwikPen Inject subcutaneously 2 units if sugars are 150 - 199, 4 units 200 -249, 6 units 250- 299, 8 units 300 - 349, 10 units >350   Insulin Pen Needle (BD PEN NEEDLE NANO 2ND GEN) 32G X 4 MM MISC USE TO INJECT INSULIN DAILY   MAGNESIUM OXIDE PO Take 400 mg by mouth daily.   meloxicam (MOBIC) 15 MG tablet Take 15 mg by mouth daily as needed.   metoprolol tartrate (LOPRESSOR) 50 MG tablet TAKE 1 TABLET BY MOUTH TWICE DAILY   Misc Natural Products (NEURIVA) CAPS Take 1 capsule by  mouth daily.   nystatin (NYSTATIN) powder Apply topically 3 (three) times daily. As needed   omeprazole (PRILOSEC) 20 MG capsule TAKE 1 CAPSULE BY MOUTH DAILY   OneTouch Delica Lancets 33G MISC USE AS DIRECTED TO CHECK BLOOD SUGAR THREE TIMES DAILY   PFIZER-BIONT COVID-19 VAC-TRIS SUSP injection    pregabalin (LYRICA) 50 MG capsule 1 capsule   Psyllium (METAMUCIL FIBER PO) Take by mouth.   rosuvastatin (CRESTOR) 20 MG tablet TAKE 1 TABLET(20 MG) BY MOUTH DAILY   traMADol (ULTRAM) 50 MG tablet TAKE 1 TO 2 TABLETS BY MOUTH 2 TO 3 TIMES DAILY WHEN NECESSARY   traZODone (DESYREL) 50 MG tablet TAKE 1 TABLET BY MOUTH EVERY NIGHT AT BEDTIME   TRULICITY 1.5 MG/0.5ML SOPN ADMINISTER 1.5 MG UNDER THE SKIN 1 TIME A WEEK   Vitamin A 3 MG (10000 UT) TABS Take 1 tablet by mouth daily. 2,400 mcg   Vitamin D, Ergocalciferol, (DRISDOL) 1.25 MG (50000 UNIT) CAPS capsule TAKE 1 CAPSULE BY MOUTH EVERY 7 DAYS   vitamin E 600 UNIT capsule Take 400 Units by mouth daily.   No facility-administered encounter medications on file as of 03/14/2021.   Recent Relevant Labs: Lab Results  Component Value Date/Time   HGBA1C 7.3 (H) 01/08/2021 10:55 AM   HGBA1C 7.5 (H) 09/27/2020 12:15 PM   MICROALBUR 80 03/29/2020 11:31 AM   MICROALBUR 30 03/23/2019 11:44 AM    Kidney Function  Lab Results  Component Value Date/Time   CREATININE 0.76 01/08/2021 10:55 AM   CREATININE 0.76 09/27/2020 12:15 PM   GFRNONAA 75 07/04/2020 02:36 PM   GFRAA 87 07/04/2020 02:36 PM    Current antihyperglycemic regimen:  Trulicity 1.5 mg weekly Farxiga 10 mg daily Tresiba: inject 26 units daily, max dose is 80 units (patient states she uses when needed not daily)  What recent interventions/DTPs have been made to improve glycemic control:  Educated on A1c and blood sugar goals; Exercise goal of 150 minutes per week; Counseled to check feet daily and get yearly eye exams  Have there been any recent hospitalizations or ED visits since  last visit with CPP? No  Patient denies hypoglycemic symptoms  Patient denies hyperglycemic symptoms  How often are you checking your blood sugar? once daily  What are your blood sugars ranging?  Fasting: 89 Before meals: None After meals: 122 Bedtime: None  During the week, how often does your blood glucose drop below 70? Never  Are you checking your feet daily/regularly? Patient states daily  Adherence Review: Is the patient currently on a STATIN medication? Yes Is the patient currently on ACE/ARB medication? No Does the patient have >5 day gap between last estimated fill dates? No  NOTES: Patient states she is getting cataract surgery on 04-12-2021. Patient stated her sugars have improved because of proper dieting and exercise.  Care Gaps: AWV 03-21-2021 Last eye exam was in May  Last podiatry exam 03-14-2021  Star Rating Drugs: Rosuvastatin 20 mg- Last filled 03-05-2021 90 DS [facility] Farxiga 10 mg- Last filled 02-11-2021 30 DS [facility] Trulicity 1.5 mg- Last filled 03-07-2021 84 DS [facility]  [facility] CMA Clinical Pharmacist Assistant [phone]

## 2021-03-14 NOTE — Progress Notes (Signed)
Subjective: [name] [name] is a 70 y.o. female patient with history of diabetes who presents to office today complaining of long,mildly painful nails  while ambulating in shoes; unable to trim. Patient states that the glucose reading this morning was 118 mg/dl. Patient reports that her A1c 7.1  PCP 2.5 months ago. Will go again next month.  Reports that she is going out of town and had a recent pedicure and does not want her nails trimmed today.  No changes since last encounter with medications.   Patient Active Problem List   Diagnosis Date Noted   Parenchymal renal hypertension 07/04/2020   Educated about COVID-19 virus infection 03/25/2020   OSA and COPD overlap syndrome ([place]) 03/14/2019   Snoring 02/17/2019   Peripheral arterial disease ([place]) 01/26/2019   Hepatitis C carrier ([place]) 01/26/2019   Hypertensive heart and renal disease 08/19/2018   Class 1 obesity due to excess calories with serious comorbidity and body mass index (BMI) of 33.0 to 33.9 in adult 08/19/2018   Paresthesia of left upper extremity 06/29/2018   Atherosclerosis of native coronary artery of native heart with angina pectoris ([place]) 04/09/2018   Dyslipidemia 04/09/2018   Primary osteoarthritis of both feet 11/05/2017   Primary osteoarthritis of both hands 10/30/2017   Primary osteoarthritis of both knees 10/30/2017   Primary insomnia 10/30/2017   Family history of lupus erythematosus 10/30/2017   Dyspnea on exertion 12/10/2016   Type 2 diabetes mellitus with stage 2 chronic kidney disease, with long-term current use of insulin ([place]) 09/21/2014   Acute cystitis 09/21/2014   Hyperglycemia 09/19/2014   Bradycardia 09/19/2014   Hyponatremia 09/19/2014   Elevated alkaline phosphatase level 09/19/2014   Acute kidney injury [facility])    Essential hypertension, benign 07/05/2008   Coronary atherosclerosis 07/05/2008   TOBACCO USER 07/03/2008   Current Outpatient Medications on File Prior to Visit  Medication Sig Dispense  Refill   Accu-Chek FastClix Lancets MISC CHECK BLOOD SUGAR THREE TIMES DAILY dx: e11.65 100 each 2   amLODipine (NORVASC) 10 MG tablet 1 tablet     Ascorbic Acid (VITAMIN C) 500 MG CHEW 1 tablet     aspirin EC 81 MG tablet Take 81 mg by mouth daily. Swallow whole.     Cholecalciferol (CVS VIT D 5000 HIGH-POTENCY PO) Take 1 tablet by mouth daily.     Coenzyme Q10 (COQ-10) 100 MG CAPS Take 1 tablet by mouth daily.      Continuous Blood Gluc Receiver (FREESTYLE LIBRE 2 READER) DEVI Use as directed to check blood sugars 3 times per day dx: e11.65 1 each 1   Cyanocobalamin (VITAMIN B-12 PO) Take 500 mcg by mouth.     FARXIGA 10 MG TABS tablet TAKE 1 TABLET(10 MG) BY MOUTH DAILY BEFORE AND BREAKFAST 30 tablet 2   glucosamine-chondroitin 500-400 MG tablet Take 1 tablet by mouth daily. Reported on 12/17/2015     glucose blood (ONETOUCH VERIO) test strip CHECK BLOOD SUGAR THREE TIMES DAILY dx: e11.65 150 each 11   insulin lispro (HUMALOG KWIKPEN) 100 UNIT/ML KwikPen Inject subcutaneously 2 units if sugars are 150 - 199, 4 units 200 -249, 6 units 250- 299, 8 units 300 - 349, 10 units >350 15 mL 0   Insulin Pen Needle (BD PEN NEEDLE NANO 2ND GEN) 32G X 4 MM MISC USE TO INJECT INSULIN DAILY 100 each 2   MAGNESIUM OXIDE PO Take 400 mg by mouth daily.     meloxicam (MOBIC) 15 MG tablet Take 15 mg by mouth  daily as needed.     metoprolol tartrate (LOPRESSOR) 50 MG tablet TAKE 1 TABLET BY MOUTH TWICE DAILY 180 tablet 4   Misc Natural Products (NEURIVA) CAPS Take 1 capsule by mouth daily.     nystatin (NYSTATIN) powder Apply topically 3 (three) times daily. As needed 60 g 0   omeprazole (PRILOSEC) 20 MG capsule TAKE 1 CAPSULE BY MOUTH DAILY 90 capsule 1   OneTouch Delica Lancets 62G MISC USE AS DIRECTED TO CHECK BLOOD SUGAR THREE TIMES DAILY 100 each 0   PFIZER-BIONT COVID-19 VAC-TRIS SUSP injection      pregabalin (LYRICA) 50 MG capsule 1 capsule     Psyllium (METAMUCIL FIBER PO) Take by mouth.      rosuvastatin (CRESTOR) 20 MG tablet TAKE 1 TABLET(20 MG) BY MOUTH DAILY 90 tablet 1   traMADol (ULTRAM) 50 MG tablet TAKE 1 TO 2 TABLETS BY MOUTH 2 TO 3 TIMES DAILY WHEN NECESSARY     traZODone (DESYREL) 50 MG tablet TAKE 1 TABLET BY MOUTH EVERY NIGHT AT BEDTIME 90 tablet 1   TRULICITY 1.5 BT/5.1VO SOPN ADMINISTER 1.5 MG UNDER THE SKIN 1 TIME A WEEK 6 mL 3   Vitamin A 3 MG (10000 UT) TABS Take 1 tablet by mouth daily. 2,400 mcg     Vitamin D, Ergocalciferol, (DRISDOL) 1.25 MG (50000 UNIT) CAPS capsule TAKE 1 CAPSULE BY MOUTH EVERY 7 DAYS 12 capsule 1   vitamin E 600 UNIT capsule Take 400 Units by mouth daily.     No current facility-administered medications on file prior to visit.   Allergies  Allergen Reactions   Fish Allergy     Cod   Latex Rash    Recent Results (from the past 2160 hour(s))  BMP8+EGFR     Status: Abnormal   Collection Time: 01/08/21 10:55 AM  Result Value Ref Range   Glucose 157 (H) 65 - 99 mg/dL   BUN 9 8 - 27 mg/dL   Creatinine, Ser 0.76 0.57 - 1.00 mg/dL   eGFR 84 >59 mL/min/1.73   BUN/Creatinine Ratio 12 12 - 28   Sodium 145 (H) 134 - 144 mmol/L   Potassium 5.1 3.5 - 5.2 mmol/L   Chloride 105 96 - 106 mmol/L   CO2 27 20 - 29 mmol/L   Calcium 9.7 8.7 - 10.3 mg/dL  Hemoglobin A1c     Status: Abnormal   Collection Time: 01/08/21 10:55 AM  Result Value Ref Range   Hgb A1c MFr Bld 7.3 (H) 4.8 - 5.6 %    Comment:          Prediabetes: 5.7 - 6.4          Diabetes: >6.4          Glycemic control for adults with diabetes: <7.0    Est. average glucose Bld gHb Est-mCnc 163 mg/dL  Vitamin B12     Status: None   Collection Time: 01/08/21 10:55 AM  Result Value Ref Range   Vitamin B-12 1,049 232 - 1,245 pg/mL    Objective: General: Patient is awake, alert, and oriented x 3 and in no acute distress.  Integument: Skin is warm, dry and supple bilateral.  Nails are well manicured.  No signs of infection.  Reactive keratosis plantar right foot.  Remaining  integument unremarkable.  Vasculature:  Dorsalis Pedis pulse 1/4 bilateral. Posterior Tibial pulse  1/4 bilateral. Capillary fill time <5 sec 1-5 bilateral. Positive hair growth to the level of the digits.Temperature gradient within normal limits. No  varicosities present bilateral. No edema present bilateral.   Neurology: The patient has intact sensation measured with a 5.07/10g Semmes Weinstein Monofilament at all pedal sites bilateral . Vibratory sensation diminished bilateral with tuning fork.   Musculoskeletal: Asymptomatic  Right bunion, pes planus and hammertoe pedal deformities noted bilateral. Muscular strength 5/5 in all lower extremity muscular groups bilateral without pain on range of motion . No tenderness with calf compression bilateral.  Assessment and Plan: Problem List Items Addressed This Visit   None Visit Diagnoses     Callus    -  Primary   Encounter for comprehensive diabetic foot examination, type 2 diabetes mellitus ([place])           -Examined patient. -Re-Discussed and educated patient on diabetic foot care, especially with  regards to the vascular, neurological and musculoskeletal systems.  -Nails were polish and well manicured no need for trimming this visit -Mechanically debrided keratosis sub met 4 on right foot using 15 blade without incident  -Continue with daily skin emollients -Continue with diabetic shoes  -Answered all patient questions -Patient advised to call the office if any problems or questions arise in the meantime.  Landis Martins, DPM

## 2021-03-18 DIAGNOSIS — Z794 Long term (current) use of insulin: Secondary | ICD-10-CM | POA: Diagnosis not present

## 2021-03-18 DIAGNOSIS — E1165 Type 2 diabetes mellitus with hyperglycemia: Secondary | ICD-10-CM | POA: Diagnosis not present

## 2021-03-21 ENCOUNTER — Other Ambulatory Visit: Payer: Self-pay

## 2021-03-21 ENCOUNTER — Ambulatory Visit (INDEPENDENT_AMBULATORY_CARE_PROVIDER_SITE_OTHER): Payer: Medicare Other

## 2021-03-21 VITALS — BP 110/64 | HR 71 | Temp 99.1°F | Ht 64.0 in | Wt 180.4 lb

## 2021-03-21 DIAGNOSIS — N182 Chronic kidney disease, stage 2 (mild): Secondary | ICD-10-CM

## 2021-03-21 DIAGNOSIS — Z794 Long term (current) use of insulin: Secondary | ICD-10-CM

## 2021-03-21 DIAGNOSIS — Z1152 Encounter for screening for COVID-19: Secondary | ICD-10-CM | POA: Diagnosis not present

## 2021-03-21 DIAGNOSIS — E1122 Type 2 diabetes mellitus with diabetic chronic kidney disease: Secondary | ICD-10-CM

## 2021-03-21 DIAGNOSIS — Z0000 Encounter for general adult medical examination without abnormal findings: Secondary | ICD-10-CM | POA: Diagnosis not present

## 2021-03-21 LAB — POCT URINALYSIS DIPSTICK
Bilirubin, UA: NEGATIVE
Glucose, UA: POSITIVE — AB
Ketones, UA: NEGATIVE
Leukocytes, UA: NEGATIVE
Nitrite, UA: NEGATIVE
Protein, UA: NEGATIVE
Spec Grav, UA: 1.015 (ref 1.010–1.025)
Urobilinogen, UA: 0.2 E.U./dL
pH, UA: 7 (ref 5.0–8.0)

## 2021-03-21 LAB — POCT UA - MICROALBUMIN
Albumin/Creatinine Ratio, Urine, POC: <30
Creatinine, POC: 200 mg/dL
Microalbumin Ur, POC: 30 mg/L

## 2021-03-21 NOTE — Progress Notes (Signed)
This visit occurred during the SARS-CoV-2 public health emergency.  Safety protocols were in place, including screening questions prior to the visit, additional usage of staff PPE, and extensive cleaning of exam room while observing appropriate contact time as indicated for disinfecting solutions.  Subjective:   [name] [name] is a 70 y.o. female who presents for Medicare Annual (Subsequent) preventive examination.  Review of Systems     Cardiac Risk Factors include: advanced age (>64men, >68 women);diabetes mellitus;dyslipidemia;hypertension;obesity (BMI >30kg/m2);sedentary lifestyle     Objective:    Today's Vitals   03/21/21 0906 03/21/21 0916  BP: 110/64   Pulse: 71   Temp: 99.1 F (37.3 C)   TempSrc: Oral   SpO2: 95%   Weight: 180 lb 6.4 oz (81.8 kg)   Height: 5\' 4"  (1.626 m)   PainSc:  8    Body mass index is 30.97 kg/m.  Advanced Directives 03/21/2021 03/29/2020 01/26/2020 09/16/2019 01/26/2019 05/19/2015 09/19/2014  Does Patient Have a Medical Advance Directive? No No No No No No No  Would patient like information on creating a medical advance directive? No - Patient declined No - Patient declined No - Patient declined No - Patient declined No - Patient declined No - patient declined information No - patient declined information    Current Medications (verified) Outpatient Encounter Medications as of 03/21/2021  Medication Sig   Accu-Chek FastClix Lancets MISC CHECK BLOOD SUGAR THREE TIMES DAILY dx: e11.65   amLODipine (NORVASC) 10 MG tablet 1 tablet   Ascorbic Acid (VITAMIN C) 500 MG CHEW 1 tablet   aspirin EC 81 MG tablet Take 81 mg by mouth daily. Swallow whole.   Cholecalciferol (CVS VIT D 5000 HIGH-POTENCY PO) Take 1 tablet by mouth daily.   Coenzyme Q10 (COQ-10) 100 MG CAPS Take 1 tablet by mouth daily.    Continuous Blood Gluc Receiver (FREESTYLE LIBRE 2 READER) DEVI Use as directed to check blood sugars 3 times per day dx: e11.65   Cyanocobalamin (VITAMIN B-12 PO)  Take 500 mcg by mouth.   FARXIGA 10 MG TABS tablet TAKE 1 TABLET(10 MG) BY MOUTH DAILY BEFORE AND BREAKFAST   glucosamine-chondroitin 500-400 MG tablet Take 1 tablet by mouth daily. Reported on 12/17/2015   glucose blood (ONETOUCH VERIO) test strip CHECK BLOOD SUGAR THREE TIMES DAILY dx: e11.65   insulin lispro (HUMALOG KWIKPEN) 100 UNIT/ML KwikPen Inject subcutaneously 2 units if sugars are 150 - 199, 4 units 200 -249, 6 units 250- 299, 8 units 300 - 349, 10 units >350   Insulin Pen Needle (BD PEN NEEDLE NANO 2ND GEN) 32G X 4 MM MISC USE TO INJECT INSULIN DAILY   MAGNESIUM OXIDE PO Take 400 mg by mouth daily.   meloxicam (MOBIC) 15 MG tablet Take 15 mg by mouth daily as needed.   metoprolol tartrate (LOPRESSOR) 50 MG tablet TAKE 1 TABLET BY MOUTH TWICE DAILY   Misc Natural Products (NEURIVA) CAPS Take 1 capsule by mouth daily.   nystatin (NYSTATIN) powder Apply topically 3 (three) times daily. As needed   omeprazole (PRILOSEC) 20 MG capsule TAKE 1 CAPSULE BY MOUTH DAILY   OneTouch Delica Lancets 33G MISC USE AS DIRECTED TO CHECK BLOOD SUGAR THREE TIMES DAILY   pregabalin (LYRICA) 50 MG capsule 1 capsule   Psyllium (METAMUCIL FIBER PO) Take by mouth.   rosuvastatin (CRESTOR) 20 MG tablet TAKE 1 TABLET(20 MG) BY MOUTH DAILY   traMADol (ULTRAM) 50 MG tablet TAKE 1 TO 2 TABLETS BY MOUTH 2 TO 3 TIMES DAILY WHEN  NECESSARY   traZODone (DESYREL) 50 MG tablet TAKE 1 TABLET BY MOUTH EVERY NIGHT AT BEDTIME   TRULICITY 1.5 MG/0.5ML SOPN ADMINISTER 1.5 MG UNDER THE SKIN 1 TIME A WEEK   Vitamin A 3 MG (10000 UT) TABS Take 1 tablet by mouth daily. 2,400 mcg   Vitamin D, Ergocalciferol, (DRISDOL) 1.25 MG (50000 UNIT) CAPS capsule TAKE 1 CAPSULE BY MOUTH EVERY 7 DAYS   vitamin E 600 UNIT capsule Take 400 Units by mouth daily.   PFIZER-BIONT COVID-19 VAC-TRIS SUSP injection    No facility-administered encounter medications on file as of 03/21/2021.    Allergies (verified) Fish allergy and Latex    History: Past Medical History:  Diagnosis Date   Anemia    Carpal tunnel syndrome    Coronary artery disease    DES to ostial RCA 2005   Diabetes mellitus without complication (HCC)    No longer taking meds   Dyslipidemia    Hypertension    Osteoarthritis    PAD (peripheral artery disease) (HCC)    Sleep apnea    Past Surgical History:  Procedure Laterality Date   APPENDECTOMY     BREAST EXCISIONAL BIOPSY Right >10+ yrs ago   benign   heart stent     TONSILLECTOMY     WRIST SURGERY     Family History  Problem Relation Age of Onset   CAD Father 26   Heart attack Father    CAD Mother 16   Heart attack Mother    CAD Brother    Cancer Brother        prostate   CAD Sister    Cancer Sister        breast   CAD Brother    Social History   Socioeconomic History   Marital status: Single    Spouse name: Not on file   Number of children: 2   Years of education: Not on file   Highest education level: Some college, no degree  Occupational History   Occupation: retired    Comment: disabled  Tobacco Use   Smoking status: Former    Packs/day: 0.50    Years: 30.00    Pack years: 15.00    Types: Cigarettes    Quit date: 12/20/2013    Years since quitting: 7.2   Smokeless tobacco: Never  Vaping Use   Vaping Use: Never used  Substance and Sexual Activity   Alcohol use: Yes    Comment: rarely   Drug use: Not Currently    Comment: quit 2013   Sexual activity: Not Currently  Other Topics Concern   Not on file  Social History Narrative   Lives alone.   One son died in a car accident.     Coffee in am ,1 cup   Social Determinants of Health   Financial Resource Strain: Low Risk    Difficulty of Paying Living Expenses: Not hard at all  Food Insecurity: No Food Insecurity   Worried About Programme researcher, broadcasting/film/video in the Last Year: Never true   Ran Out of Food in the Last Year: Never true  Transportation Needs: No Transportation Needs   Lack of Transportation (Medical):  No   Lack of Transportation (Non-Medical): No  Physical Activity: Inactive   Days of Exercise per Week: 0 days   Minutes of Exercise per Session: 0 min  Stress: No Stress Concern Present   Feeling of Stress : Only a little  Social Connections: Not on file  Tobacco Counseling Counseling given: Not Answered   Clinical Intake:  Pre-visit preparation completed: Yes  Pain : 0-10 Pain Score: 8  Pain Type: Chronic pain Pain Location: Shoulder Pain Orientation: Right Pain Descriptors / Indicators: Shooting Pain Onset: More than a month ago Pain Frequency: Constant     Nutritional Status: BMI > 30  Obese Nutritional Risks: None Diabetes: Yes  How often do you need to have someone help you when you read instructions, pamphlets, or other written materials from your doctor or pharmacy?: 1 - Never What is the last grade level you completed in school?: some college  Diabetic? Yes Nutrition Risk Assessment:  Has the patient had any N/V/D within the last 2 months?  No  Does the patient have any non-healing wounds?  No  Has the patient had any unintentional weight loss or weight gain?  No   Diabetes:  Is the patient diabetic?  Yes  If diabetic, was a CBG obtained today?  No  Did the patient bring in their glucometer from home?  No  How often do you monitor your CBG's? 3-4 daily.   Financial Strains and Diabetes Management:  Are you having any financial strains with the device, your supplies or your medication? No .  Does the patient want to be seen by Chronic Care Management for management of their diabetes?  No  Would the patient like to be referred to a Nutritionist or for Diabetic Management?  No   Diabetic Exams:  Diabetic Eye Exam: Completed 11/14/2020 Diabetic Foot Exam: Completed 03/29/2020   Interpreter Needed?: No  Information entered by :: NAllen LPN   Activities of Daily Living In your present state of health, do you have any difficulty performing the  following activities: 03/21/2021 03/29/2020  Hearing? N N  Vision? Y Y  Comment sometimes due to cataract blurry at times  Difficulty concentrating or making decisions? N N  Walking or climbing stairs? N N  Dressing or bathing? N N  Doing errands, shopping? N N  Preparing Food and eating ? N N  Using the Toilet? N N  In the past six months, have you accidently leaked urine? Y Y  Comment - a little bit at night  Do you have problems with loss of bowel control? Y N  Managing your Medications? N N  Managing your Finances? N N  Housekeeping or managing your Housekeeping? N N  Some recent data might be hidden    Patient Care Team: [name], MD as PCP - General (Internal Medicine) Rollene Rotunda, MD as PCP - Cardiology (Cardiology)  Indicate any recent Medical Services you may have received from other than [facility] providers in the past year (date may be approximate).     Assessment:   This is a routine wellness examination for [place].  Hearing/Vision screen Vision Screening - Comments:: Regular eye exams, Dr. Dione Booze  Dietary issues and exercise activities discussed: Current Exercise Habits: The patient does not participate in regular exercise at present   Goals Addressed             This Visit's Progress    Patient Stated       03/21/2021, wants to cure diabetes       Depression Screen PHQ 2/9 Scores 03/21/2021 01/08/2021 09/27/2020 07/04/2020 03/29/2020 03/29/2020 09/16/2019  PHQ - 2 Score 0 0 0 0 1 0 0  PHQ- 9 Score - - - - - - -    Fall Risk Fall Risk  03/21/2021 03/29/2020 09/16/2019 01/26/2019  01/26/2019  Falls in the past year? 0 0 0 0 0  Risk for fall due to : Medication side effect Medication side effect - Medication side effect -  Follow up Falls evaluation completed;Education provided;Falls prevention discussed Falls evaluation completed;Education provided;Falls prevention discussed - Falls evaluation completed;Falls prevention discussed -    FALL RISK PREVENTION  PERTAINING TO THE HOME:  Any stairs in or around the home? Yes  If so, are there any without handrails? No  Home free of loose throw rugs in walkways, pet beds, electrical cords, etc? Yes  Adequate lighting in your home to reduce risk of falls? Yes   ASSISTIVE DEVICES UTILIZED TO PREVENT FALLS:  Life alert? No  Use of a cane, walker or w/c? No  Grab bars in the bathroom? Yes  Shower chair or bench in shower? No  Elevated toilet seat or a handicapped toilet? Yes   TIMED UP AND GO:  Was the test performed? No .    Gait steady and fast without use of assistive device  Cognitive Function:     6CIT Screen 03/21/2021 03/29/2020 01/26/2019  What Year? 0 points 0 points 0 points  What month? 0 points 0 points 0 points  What time? 0 points 0 points 0 points  Count back from 20 0 points 0 points 0 points  Months in reverse 0 points 0 points 0 points  Repeat phrase 0 points 2 points 0 points  Total Score 0 2 0    Immunizations Immunization History  Administered Date(s) Administered   Fluad Quad(high Dose 65+) 03/04/2019   Influenza, High Dose Seasonal PF 03/21/2017, 03/11/2018   Influenza-Unspecified 03/07/2020, 03/13/2021   PFIZER(Purple Top)SARS-COV-2 Vaccination 07/30/2019, 08/20/2019, 04/16/2020   Pneumococcal Conjugate-13 10/23/2016   Zoster Recombinat (Shingrix) 01/25/2019, 03/30/2019    TDAP status: Up to date  Flu Vaccine status: Up to date  Pneumococcal vaccine status: Up to date  Covid-19 vaccine status: Completed vaccines  Qualifies for Shingles Vaccine? Yes   Zostavax completed No   Shingrix Completed?: Yes  Screening Tests Health Maintenance  Topic Date Due   COVID-19 Vaccine (4 - Booster for Pfizer series) 07/09/2020   URINE MICROALBUMIN  03/29/2021   FOOT EXAM  03/29/2021   HEMOGLOBIN A1C  07/11/2021   OPHTHALMOLOGY EXAM  11/14/2021   MAMMOGRAM  11/29/2022   TETANUS/TDAP  12/14/2024   COLONOSCOPY (Pts 45-33yrs Insurance coverage will need to be  confirmed)  10/11/2029   INFLUENZA VACCINE  Completed   DEXA SCAN  Completed   Hepatitis C Screening  Completed   Zoster Vaccines- Shingrix  Completed   HPV VACCINES  Aged Out    Health Maintenance  Health Maintenance Due  Topic Date Due   COVID-19 Vaccine (4 - Booster for Pfizer series) 07/09/2020   URINE MICROALBUMIN  03/29/2021    Colorectal cancer screening: Type of screening: Colonoscopy. Completed 10/12/2019. Repeat every 5 years  Mammogram status: Completed 11/28/2020. Repeat every year  Bone Density status: Completed 05/28/2010.   Lung Cancer Screening: (Low Dose CT Chest recommended if Age 42-80 years, 30 pack-year currently smoking OR have quit w/in 15years.) does not qualify.   Lung Cancer Screening Referral: no  Additional Screening:  Hepatitis C Screening: does qualify; Completed 10/25/2019  Vision Screening: Recommended annual ophthalmology exams for early detection of glaucoma and other disorders of the eye. Is the patient up to date with their annual eye exam?  Yes  Who is the provider or what is the name of the office in which  the patient attends annual eye exams? Dr.Groat If pt is not established with a provider, would they like to be referred to a provider to establish care? No .   Dental Screening: Recommended annual dental exams for proper oral hygiene  Community Resource Referral / Chronic Care Management: CRR required this visit?  No   CCM required this visit?  No      Plan:     I have personally reviewed and noted the following in the patient's chart:   Medical and social history Use of alcohol, tobacco or illicit drugs  Current medications and supplements including opioid prescriptions.  Functional ability and status Nutritional status Physical activity Advanced directives List of other physicians Hospitalizations, surgeries, and ER visits in previous 12 months Vitals Screenings to include cognitive, depression, and falls Referrals and  appointments  In addition, I have reviewed and discussed with patient certain preventive protocols, quality metrics, and best practice recommendations. A written personalized care plan for preventive services as well as general preventive health recommendations were provided to patient.     Barb Merino, LPN   0/02/[id number]   Nurse Notes:

## 2021-03-21 NOTE — Patient Instructions (Signed)
[name] [name] , Thank you for taking time to come for your Medicare Wellness Visit. I appreciate your ongoing commitment to your health goals. Please review the following plan we discussed and let me know if I can assist you in the future.   Screening recommendations/referrals: Colonoscopy: completed 10/12/2019 Mammogram: completed 11/28/2020 Bone Density: completed 05/28/2010 Recommended yearly ophthalmology/optometry visit for glaucoma screening and checkup Recommended yearly dental visit for hygiene and checkup  Vaccinations: Influenza vaccine: completed 03/13/2021 Pneumococcal vaccine: completed 10/23/2016 Tdap vaccine: completed 6/24/216, due 12/14/2024 Shingles vaccine: completed    Covid-19: 04/16/2020, 08/20/2019, 07/30/2019  Advanced directives: Advance directive discussed with you today. Even though you declined this today please call our office should you change your mind and we can give you the proper paperwork for you to fill out.  Conditions/risks identified: none  Next appointment: Follow up in one year for your annual wellness visit    Preventive Care 65 Years and Older, Female Preventive care refers to lifestyle choices and visits with your health care provider that can promote health and wellness. What does preventive care include? A yearly physical exam. This is also called an annual well check. Dental exams once or twice a year. Routine eye exams. Ask your health care provider how often you should have your eyes checked. Personal lifestyle choices, including: Daily care of your teeth and gums. Regular physical activity. Eating a healthy diet. Avoiding tobacco and drug use. Limiting alcohol use. Practicing safe sex. Taking low-dose aspirin every day. Taking vitamin and mineral supplements as recommended by your health care provider. What happens during an annual well check? The services and screenings done by your health care provider during your annual well check will  depend on your age, overall health, lifestyle risk factors, and family history of disease. Counseling  Your health care provider may ask you questions about your: Alcohol use. Tobacco use. Drug use. Emotional well-being. Home and relationship well-being. Sexual activity. Eating habits. History of falls. Memory and ability to understand (cognition). Work and work Astronomer. Reproductive health. Screening  You may have the following tests or measurements: Height, weight, and BMI. Blood pressure. Lipid and cholesterol levels. These may be checked every 5 years, or more frequently if you are over 58 years old. Skin check. Lung cancer screening. You may have this screening every year starting at age 83 if you have a 30-pack-year history of smoking and currently smoke or have quit within the past 15 years. Fecal occult blood test (FOBT) of the stool. You may have this test every year starting at age 4. Flexible sigmoidoscopy or colonoscopy. You may have a sigmoidoscopy every 5 years or a colonoscopy every 10 years starting at age 52. Hepatitis C blood test. Hepatitis B blood test. Sexually transmitted disease (STD) testing. Diabetes screening. This is done by checking your blood sugar (glucose) after you have not eaten for a while (fasting). You may have this done every 1-3 years. Bone density scan. This is done to screen for osteoporosis. You may have this done starting at age 19. Mammogram. This may be done every 1-2 years. Talk to your health care provider about how often you should have regular mammograms. Talk with your health care provider about your test results, treatment options, and if necessary, the need for more tests. Vaccines  Your health care provider may recommend certain vaccines, such as: Influenza vaccine. This is recommended every year. Tetanus, diphtheria, and acellular pertussis (Tdap, Td) vaccine. You may need a Td booster every 10  years. Zoster vaccine. You may  need this after age 74. Pneumococcal 13-valent conjugate (PCV13) vaccine. One dose is recommended after age 27. Pneumococcal polysaccharide (PPSV23) vaccine. One dose is recommended after age 91. Talk to your health care provider about which screenings and vaccines you need and how often you need them. This information is not intended to replace advice given to you by your health care provider. Make sure you discuss any questions you have with your health care provider. Document Released: 07/06/2015 Document Revised: 02/27/2016 Document Reviewed: 04/10/2015 Elsevier Interactive Patient Education  2017 [place] Prevention in the Home Falls can cause injuries. They can happen to people of all ages. There are many things you can do to make your home safe and to help prevent falls. What can I do on the outside of my home? Regularly fix the edges of walkways and driveways and fix any cracks. Remove anything that might make you trip as you walk through a door, such as a raised step or threshold. Trim any bushes or trees on the path to your home. Use bright outdoor lighting. Clear any walking paths of anything that might make someone trip, such as rocks or tools. Regularly check to see if handrails are loose or broken. Make sure that both sides of any steps have handrails. Any raised decks and porches should have guardrails on the edges. Have any leaves, snow, or ice cleared regularly. Use sand or salt on walking paths during winter. Clean up any spills in your garage right away. This includes oil or grease spills. What can I do in the bathroom? Use night lights. Install grab bars by the toilet and in the tub and shower. Do not use towel bars as grab bars. Use non-skid mats or decals in the tub or shower. If you need to sit down in the shower, use a plastic, non-slip stool. Keep the floor dry. Clean up any water that spills on the floor as soon as it happens. Remove soap buildup in  the tub or shower regularly. Attach bath mats securely with double-sided non-slip rug tape. Do not have throw rugs and other things on the floor that can make you trip. What can I do in the bedroom? Use night lights. Make sure that you have a light by your bed that is easy to reach. Do not use any sheets or blankets that are too big for your bed. They should not hang down onto the floor. Have a firm chair that has side arms. You can use this for support while you get dressed. Do not have throw rugs and other things on the floor that can make you trip. What can I do in the kitchen? Clean up any spills right away. Avoid walking on wet floors. Keep items that you use a lot in easy-to-reach places. If you need to reach something above you, use a strong step stool that has a grab bar. Keep electrical cords out of the way. Do not use floor polish or wax that makes floors slippery. If you must use wax, use non-skid floor wax. Do not have throw rugs and other things on the floor that can make you trip. What can I do with my stairs? Do not leave any items on the stairs. Make sure that there are handrails on both sides of the stairs and use them. Fix handrails that are broken or loose. Make sure that handrails are as long as the stairways. Check any carpeting to make sure  that it is firmly attached to the stairs. Fix any carpet that is loose or worn. Avoid having throw rugs at the top or bottom of the stairs. If you do have throw rugs, attach them to the floor with carpet tape. Make sure that you have a light switch at the top of the stairs and the bottom of the stairs. If you do not have them, ask someone to add them for you. What else can I do to help prevent falls? Wear shoes that: Do not have high heels. Have rubber bottoms. Are comfortable and fit you well. Are closed at the toe. Do not wear sandals. If you use a stepladder: Make sure that it is fully opened. Do not climb a closed  stepladder. Make sure that both sides of the stepladder are locked into place. Ask someone to hold it for you, if possible. Clearly mark and make sure that you can see: Any grab bars or handrails. First and last steps. Where the edge of each step is. Use tools that help you move around (mobility aids) if they are needed. These include: Canes. Walkers. Scooters. Crutches. Turn on the lights when you go into a dark area. Replace any light bulbs as soon as they burn out. Set up your furniture so you have a clear path. Avoid moving your furniture around. If any of your floors are uneven, fix them. If there are any pets around you, be aware of where they are. Review your medicines with your doctor. Some medicines can make you feel dizzy. This can increase your chance of falling. Ask your doctor what other things that you can do to help prevent falls. This information is not intended to replace advice given to you by your health care provider. Make sure you discuss any questions you have with your health care provider. Document Released: 04/05/2009 Document Revised: 11/15/2015 Document Reviewed: 07/14/2014 Elsevier Interactive Patient Education  2017 [employer].

## 2021-03-21 NOTE — Addendum Note (Signed)
Addended by: Barb Merino on: 03/21/2021 10:33 AM   Modules accepted: Orders

## 2021-03-23 ENCOUNTER — Other Ambulatory Visit: Payer: Self-pay | Admitting: Internal Medicine

## 2021-03-23 DIAGNOSIS — Z79899 Other long term (current) drug therapy: Secondary | ICD-10-CM

## 2021-03-26 ENCOUNTER — Other Ambulatory Visit: Payer: Self-pay | Admitting: Internal Medicine

## 2021-03-28 ENCOUNTER — Other Ambulatory Visit: Payer: Medicare Other

## 2021-03-29 ENCOUNTER — Ambulatory Visit: Payer: Medicare Other | Admitting: Cardiology

## 2021-03-30 DIAGNOSIS — Z1152 Encounter for screening for COVID-19: Secondary | ICD-10-CM | POA: Diagnosis not present

## 2021-04-02 ENCOUNTER — Other Ambulatory Visit: Payer: Self-pay | Admitting: Internal Medicine

## 2021-04-05 ENCOUNTER — Other Ambulatory Visit: Payer: Self-pay

## 2021-04-05 MED ORDER — ACCU-CHEK FASTCLIX LANCETS MISC: 2 refills | Status: DC

## 2021-04-09 ENCOUNTER — Other Ambulatory Visit: Payer: Self-pay | Admitting: Internal Medicine

## 2021-04-09 MED ORDER — PREGABALIN 50 MG PO CAPS: 50.0000 mg | ORAL_CAPSULE | Freq: Every evening | ORAL | 1 refills | Status: DC

## 2021-04-10 ENCOUNTER — Ambulatory Visit (INDEPENDENT_AMBULATORY_CARE_PROVIDER_SITE_OTHER): Payer: Medicare Other | Admitting: Internal Medicine

## 2021-04-10 ENCOUNTER — Other Ambulatory Visit: Payer: Self-pay

## 2021-04-10 ENCOUNTER — Encounter: Payer: Self-pay | Admitting: Internal Medicine

## 2021-04-10 ENCOUNTER — Ambulatory Visit: Payer: Medicare Other

## 2021-04-10 VITALS — BP 120/78 | HR 75 | Temp 98.1°F | Ht 64.0 in | Wt 180.0 lb

## 2021-04-10 DIAGNOSIS — Z0000 Encounter for general adult medical examination without abnormal findings: Secondary | ICD-10-CM | POA: Diagnosis not present

## 2021-04-10 DIAGNOSIS — M25511 Pain in right shoulder: Secondary | ICD-10-CM | POA: Diagnosis not present

## 2021-04-10 DIAGNOSIS — G8929 Other chronic pain: Secondary | ICD-10-CM | POA: Diagnosis not present

## 2021-04-10 DIAGNOSIS — N182 Chronic kidney disease, stage 2 (mild): Secondary | ICD-10-CM

## 2021-04-10 DIAGNOSIS — I129 Hypertensive chronic kidney disease with stage 1 through stage 4 chronic kidney disease, or unspecified chronic kidney disease: Secondary | ICD-10-CM | POA: Diagnosis not present

## 2021-04-10 DIAGNOSIS — I25119 Atherosclerotic heart disease of native coronary artery with unspecified angina pectoris: Secondary | ICD-10-CM | POA: Diagnosis not present

## 2021-04-10 DIAGNOSIS — Z23 Encounter for immunization: Secondary | ICD-10-CM

## 2021-04-10 DIAGNOSIS — E785 Hyperlipidemia, unspecified: Secondary | ICD-10-CM

## 2021-04-10 DIAGNOSIS — Z6831 Body mass index (BMI) 31.0-31.9, adult: Secondary | ICD-10-CM

## 2021-04-10 DIAGNOSIS — E6609 Other obesity due to excess calories: Secondary | ICD-10-CM

## 2021-04-10 DIAGNOSIS — E1122 Type 2 diabetes mellitus with diabetic chronic kidney disease: Secondary | ICD-10-CM

## 2021-04-10 DIAGNOSIS — Z794 Long term (current) use of insulin: Secondary | ICD-10-CM

## 2021-04-10 LAB — POCT URINALYSIS DIPSTICK
Bilirubin, UA: NEGATIVE
Blood, UA: NEGATIVE
Glucose, UA: POSITIVE — AB
Ketones, UA: NEGATIVE
Leukocytes, UA: NEGATIVE
Nitrite, UA: NEGATIVE
Protein, UA: NEGATIVE
Spec Grav, UA: 1.02 (ref 1.010–1.025)
Urobilinogen, UA: 0.2 E.U./dL
pH, UA: 6.5 (ref 5.0–8.0)

## 2021-04-10 LAB — POCT UA - MICROALBUMIN
Albumin/Creatinine Ratio, Urine, POC: <30
Creatinine, POC: 200 mg/dL
Microalbumin Ur, POC: 30 mg/L

## 2021-04-10 MED ORDER — PREVNAR 20 0.5 ML IM SUSY: 0.5000 mL | PREFILLED_SYRINGE | INTRAMUSCULAR | 0 refills | Status: AC

## 2021-04-10 NOTE — Progress Notes (Signed)
I,Katawbba Wiggins,acting as a Education administrator for [name], MD.,have documented all relevant documentation on the behalf of [name], MD,as directed by  [name], MD while in the presence of [name], MD.  This visit occurred during the SARS-CoV-2 public health emergency.  Safety protocols were in place, including screening questions prior to the visit, additional usage of staff PPE, and extensive cleaning of exam room while observing appropriate contact time as indicated for disinfecting solutions.  Subjective:     Patient ID: [name] [name] , female    DOB: 1950-10-10 , 70 y.o.   MRN: [id number]   Chief Complaint  Patient presents with   Annual Exam   Diabetes   Hypertension    HPI  She is here today for a full physical examination. She reports compliance with meds. She denies headaches, chest pain and shortness of breath.   Diabetes She presents for her follow-up diabetic visit. She has type 2 diabetes mellitus. Her disease course has been improving. Pertinent negatives for diabetes include no blurred vision and no chest pain. There are no hypoglycemic complications. Risk factors for coronary artery disease include diabetes mellitus, dyslipidemia, hypertension and post-menopausal. Current diabetic treatment includes insulin injections. She is compliant with treatment most of the time. She is following a diabetic diet. She participates in exercise intermittently. Eye exam is current.  Hypertension This is a chronic problem. The current episode started more than 1 year ago. The problem has been gradually improving since onset. The problem is controlled. Pertinent negatives include no blurred vision, chest pain or palpitations. Past treatments include angiotensin blockers. The current treatment provides moderate improvement. Compliance problems include exercise and diet.  Hypertensive end-organ damage includes kidney disease. Identifiable causes of hypertension include  chronic renal disease.    Past Medical History:  Diagnosis Date   Anemia    Carpal tunnel syndrome    Coronary artery disease    DES to ostial RCA 2005   Diabetes mellitus without complication (HCC)    No longer taking meds   Dyslipidemia    Hypertension    Osteoarthritis    PAD (peripheral artery disease) (HCC)    Sleep apnea      Family History  Problem Relation Age of Onset   CAD Father 48   Heart attack Father    CAD Mother 60   Heart attack Mother    CAD Brother    Cancer Brother        prostate   CAD Sister    Cancer Sister        breast   CAD Brother      Current Outpatient Medications:    Accu-Chek FastClix Lancets MISC, CHECK BLOOD SUGAR THREE TIMES DAILY dx: e11.65, Disp: 100 each, Rfl: 2   amLODipine (NORVASC) 10 MG tablet, TAKE 1 TABLET(10 MG) BY MOUTH DAILY, Disp: 90 tablet, Rfl: 1   Ascorbic Acid (VITAMIN C) 500 MG CHEW, 1 tablet, Disp: , Rfl:    aspirin EC 81 MG tablet, Take 81 mg by mouth daily. Swallow whole., Disp: , Rfl:    Cholecalciferol ([employer] VIT D 5000 HIGH-POTENCY PO), Take 1 tablet by mouth daily., Disp: , Rfl:    Coenzyme Q10 (COQ-10) 100 MG CAPS, Take 1 tablet by mouth daily. , Disp: , Rfl:    Continuous Blood Gluc Receiver (FREESTYLE LIBRE 2 READER) DEVI, Use as directed to check blood sugars 3 times per day dx: e11.65, Disp: 1 each, Rfl: 1   Cyanocobalamin (VITAMIN  B-12 PO), Take 500 mcg by mouth., Disp: , Rfl:    FARXIGA 10 MG TABS tablet, TAKE 1 TABLET(10 MG) BY MOUTH DAILY BEFORE AND BREAKFAST, Disp: 30 tablet, Rfl: 2   glucosamine-chondroitin 500-400 MG tablet, Take 1 tablet by mouth daily. Reported on 12/17/2015, Disp: , Rfl:    glucose blood (ONETOUCH VERIO) test strip, CHECK BLOOD SUGAR THREE TIMES DAILY dx: e11.65, Disp: 150 each, Rfl: 11   insulin lispro (HUMALOG KWIKPEN) 100 UNIT/ML KwikPen, Inject subcutaneously 2 units if sugars are 150 - 199, 4 units 200 -249, 6 units 250- 299, 8 units 300 - 349, 10 units >350, Disp: 15 mL, Rfl: 0    Insulin Pen Needle (BD PEN NEEDLE NANO 2ND GEN) 32G X 4 MM MISC, USE TO INJECT INSULIN DAILY, Disp: 100 each, Rfl: 2   MAGNESIUM OXIDE PO, Take 400 mg by mouth daily., Disp: , Rfl:    meloxicam (MOBIC) 15 MG tablet, Take 15 mg by mouth daily as needed., Disp: , Rfl:    metoprolol tartrate (LOPRESSOR) 50 MG tablet, TAKE 1 TABLET BY MOUTH TWICE DAILY, Disp: 180 tablet, Rfl: 4   Misc Natural Products (NEURIVA) CAPS, Take 1 capsule by mouth daily., Disp: , Rfl:    nystatin (NYSTATIN) powder, Apply topically 3 (three) times daily. As needed, Disp: 60 g, Rfl: 0   omeprazole (PRILOSEC) 20 MG capsule, TAKE 1 CAPSULE BY MOUTH DAILY, Disp: 90 capsule, Rfl: 1   OneTouch Delica Lancets 13K MISC, USE AS DIRECTED TO CHECK BLOOD SUGAR THREE TIMES DAILY, Disp: 100 each, Rfl: 0   PFIZER-BIONT COVID-19 VAC-TRIS SUSP injection, , Disp: , Rfl:    pregabalin (LYRICA) 50 MG capsule, Take 1 capsule (50 mg total) by mouth at bedtime., Disp: 90 capsule, Rfl: 1   Psyllium (METAMUCIL FIBER PO), Take by mouth., Disp: , Rfl:    rosuvastatin (CRESTOR) 20 MG tablet, TAKE 1 TABLET(20 MG) BY MOUTH DAILY, Disp: 90 tablet, Rfl: 1   traMADol (ULTRAM) 50 MG tablet, TAKE 1 TO 2 TABLETS BY MOUTH 2 TO 3 TIMES DAILY WHEN NECESSARY, Disp: , Rfl:    traZODone (DESYREL) 50 MG tablet, TAKE 1 TABLET BY MOUTH EVERY NIGHT AT BEDTIME, Disp: 90 tablet, Rfl: 1   TRULICITY 1.5 GM/0.1UU SOPN, ADMINISTER 1.5 MG UNDER THE SKIN 1 TIME A WEEK, Disp: 6 mL, Rfl: 3   Vitamin A 3 MG (10000 UT) TABS, Take 1 tablet by mouth daily. 2,400 mcg, Disp: , Rfl:    Vitamin D, Ergocalciferol, (DRISDOL) 1.25 MG (50000 UNIT) CAPS capsule, TAKE 1 CAPSULE BY MOUTH EVERY 7 DAYS, Disp: 12 capsule, Rfl: 1   vitamin E 600 UNIT capsule, Take 400 Units by mouth daily., Disp: , Rfl:    Allergies  Allergen Reactions   Fish Allergy     Cod   Latex Rash      The patient states she uses post menopausal status for birth control. Last LMP was No LMP recorded. Patient is  postmenopausal.. Negative for Dysmenorrhea. Negative for: breast discharge, breast lump(s), breast pain and breast self exam. Associated symptoms include abnormal vaginal bleeding. Pertinent negatives include abnormal bleeding (hematology), anxiety, decreased libido, depression, difficulty falling sleep, dyspareunia, history of infertility, nocturia, sexual dysfunction, sleep disturbances, urinary incontinence, urinary urgency, vaginal discharge and vaginal itching. Diet regular.The patient states her exercise level is  intermittent.  . The patient's tobacco use is:  Social History   Tobacco Use  Smoking Status Former   Packs/day: 0.50   Years: 30.00  Pack years: 15.00   Types: Cigarettes   Quit date: 12/20/2013   Years since quitting: 7.3  Smokeless Tobacco Never  . She has been exposed to passive smoke. The patient's alcohol use is:  Social History   Substance and Sexual Activity  Alcohol Use Yes   Comment: rarely    Review of Systems  Constitutional: Negative.   HENT: Negative.    Eyes: Negative.  Negative for blurred vision.  Respiratory: Negative.    Cardiovascular: Negative.  Negative for chest pain and palpitations.  Gastrointestinal: Negative.   Endocrine: Negative.   Genitourinary: Negative.   Musculoskeletal:  Positive for arthralgias.       She c/o worsening r shoulder pain. Denies recent fall/trauma. Has been eval by Dr. Rip Harbour in the past. Admits she has not reached out for an appt. There is pain with movement. Pain seems to be worse at night.   Skin: Negative.   Allergic/Immunologic: Negative.   Neurological: Negative.   Hematological: Negative.   Psychiatric/Behavioral: Negative.      Today's Vitals   04/10/21 1426  BP: 120/78  Pulse: 75  Temp: 98.1 F (36.7 C)  Weight: 180 lb (81.6 kg)  Height: 5' 4" (1.626 m)  PainSc: 8   PainLoc: Shoulder   Body mass index is 30.9 kg/m.  Wt Readings from Last 3 Encounters:  04/10/21 180 lb (81.6 kg)  03/21/21  180 lb 6.4 oz (81.8 kg)  01/08/21 185 lb 6.4 oz (84.1 kg)    BP Readings from Last 3 Encounters:  04/10/21 120/78  03/21/21 110/64  01/08/21 124/76    Objective:  Physical Exam Vitals and nursing note reviewed.  Constitutional:      General: She is not in acute distress.    Appearance: Normal appearance. She is well-developed. She is obese.  HENT:     Head: Normocephalic and atraumatic.     Right Ear: Hearing, tympanic membrane, ear canal and external ear normal. There is no impacted cerumen.     Left Ear: Hearing, tympanic membrane, ear canal and external ear normal. There is no impacted cerumen.     Nose:     Comments: Deferred - masked    Mouth/Throat:     Comments: Deferred - masked Eyes:     General: Lids are normal.     Extraocular Movements: Extraocular movements intact.     Conjunctiva/sclera: Conjunctivae normal.     Pupils: Pupils are equal, round, and reactive to light.     Funduscopic exam:    Right eye: No papilledema.        Left eye: No papilledema.  Neck:     Thyroid: No thyroid mass.     Vascular: No carotid bruit.  Cardiovascular:     Rate and Rhythm: Normal rate and regular rhythm.     Pulses:          Dorsalis pedis pulses are 1+ on the right side and 1+ on the left side.     Heart sounds: Normal heart sounds. No murmur heard. Pulmonary:     Effort: Pulmonary effort is normal.     Breath sounds: Normal breath sounds.  Chest:     Chest wall: No mass.  Breasts:    Tanner Score is 5.     Right: Normal. No mass or tenderness.     Left: Normal. No mass or tenderness.  Abdominal:     General: Abdomen is flat. Bowel sounds are normal. There is no distension.  Palpations: Abdomen is soft.     Tenderness: There is no abdominal tenderness.  Musculoskeletal:        General: Tenderness present. No swelling.     Cervical back: Full passive range of motion without pain, normal range of motion and neck supple.     Right lower leg: No edema.     Left  lower leg: No edema.  Feet:     Right foot:     Protective Sensation: 5 sites tested.  5 sites sensed.     Skin integrity: Dry skin present.     Toenail Condition: Right toenails are normal.     Left foot:     Protective Sensation: 5 sites tested.  5 sites sensed.     Skin integrity: Dry skin present.     Toenail Condition: Left toenails are normal.  Lymphadenopathy:     Upper Body:     Right upper body: No supraclavicular, axillary or pectoral adenopathy.     Left upper body: No supraclavicular, axillary or pectoral adenopathy.  Skin:    General: Skin is warm and dry.     Capillary Refill: Capillary refill takes less than 2 seconds.  Neurological:     General: No focal deficit present.     Mental Status: She is alert and oriented to person, place, and time.     Cranial Nerves: No cranial nerve deficit.     Sensory: No sensory deficit.  Psychiatric:        Mood and Affect: Mood normal.        Behavior: Behavior normal.        Thought Content: Thought content normal.        Judgment: Judgment normal.        Assessment And Plan:     1. Routine general medical examination at a health care facility Comments: A full exam was performed. Imporatnce of monthly self breast exams was discussed with the patient. PATIENT IS ADVISED TO GET 30-45 MINUTES REGULAR EXERCISE NO LESS THAN FOUR TO FIVE DAYS PER WEEK - BOTH WEIGHTBEARING EXERCISES AND AEROBIC ARE RECOMMENDED.  PATIENT IS ADVISED TO FOLLOW A HEALTHY DIET WITH AT LEAST SIX FRUITS/VEGGIES PER DAY, DECREASE INTAKE OF RED MEAT, AND TO INCREASE FISH INTAKE TO TWO DAYS PER WEEK.  MEATS/FISH SHOULD NOT BE FRIED, BAKED OR BROILED IS PREFERABLE.  IT IS ALSO IMPORTANT TO CUT BACK ON YOUR SUGAR INTAKE. PLEASE AVOID ANYTHING WITH ADDED SUGAR, CORN SYRUP OR OTHER SWEETENERS. IF YOU MUST USE A SWEETENER, YOU CAN TRY STEVIA. IT IS ALSO IMPORTANT TO AVOID ARTIFICIALLY SWEETENERS AND DIET BEVERAGES. LASTLY, I SUGGEST WEARING SPF 50 SUNSCREEN ON EXPOSED  PARTS AND ESPECIALLY WHEN IN THE DIRECT SUNLIGHT FOR AN EXTENDED PERIOD OF TIME.  PLEASE AVOID FAST FOOD RESTAURANTS AND INCREASE YOUR WATER INTAKE.   2. Type 2 diabetes mellitus with stage 2 chronic kidney disease, with long-term current use of insulin (HCC) Comments: Diabetic foot exam was performed. She has been checking her sugars 4 times daily. I DISCUSSED WITH THE PATIENT AT LENGTH REGARDING THE GOALS OF GLYCEMIC CONTROL AND POSSIBLE LONG-TERM COMPLICATIONS.  I  ALSO STRESSED THE IMPORTANCE OF COMPLIANCE WITH HOME GLUCOSE MONITORING, DIETARY RESTRICTIONS INCLUDING AVOIDANCE OF SUGARY DRINKS/PROCESSED FOODS,  ALONG WITH REGULAR EXERCISE.  I  ALSO STRESSED THE IMPORTANCE OF ANNUAL EYE EXAMS, SELF FOOT CARE AND COMPLIANCE WITH OFFICE VISITS.  - POCT Urinalysis Dipstick (81002) - POCT UA - Microalbumin - Hemoglobin A1c - CBC - CMP14+EGFR - Lipid panel -  TSH  3. Parenchymal renal hypertension, stage 1 through stage 4 or unspecified chronic kidney disease Comments: Chronic, well controlled. EKG performed, NSR, low voltage, nonspecific T abnormality w/ occ. PAC. No new changes. Encouraged to follow low sodium diet.  - EKG 12-Lead - CMP14+EGFR  4. Atherosclerosis of native coronary artery of native heart with angina pectoris ([place]) Comments: Chronic, encouraged to follow heart healthy lifestyle - regular exercise, clean eating, high fiber diet, good sleep hygiene and stress management.  - Lipid panel  5. Dyslipidemia Comments: Chronic, I will check lipid panel today. Goal LDL is less than 70, encouraged to limit her intake of trans and saturated fats.   6. Chronic right shoulder pain Comments: I will refer her to Ortho for further evaluation. She was previously established with [facility].  - Ambulatory referral to Orthopedic Surgery  7. Class 1 obesity due to excess calories with serious comorbidity and body mass index (BMI) of 31.0 to 31.9 in adult Comments: She is encouraged to  strive for BMi less than/= 29 to decrease cardiac risk. Advised to incorporate more exercise into her daily routine.   8. Immunization due Comments: I will send rx [id number] to her local pharmacy.   Patient was given opportunity to ask questions. Patient verbalized understanding of the plan and was able to repeat key elements of the plan. All questions were answered to their satisfaction.   I, [name], MD, have reviewed all documentation for this visit. The documentation on 04/13/21 for the exam, diagnosis, procedures, and orders are all accurate and complete.   THE PATIENT IS ENCOURAGED TO PRACTICE SOCIAL DISTANCING DUE TO THE COVID-19 PANDEMIC.

## 2021-04-10 NOTE — Patient Instructions (Signed)
Health Maintenance, Female Adopting a healthy lifestyle and getting preventive care are important in promoting health and wellness. Ask your health care provider about: The right schedule for you to have regular tests and exams. Things you can do on your own to prevent diseases and keep yourself healthy. What should I know about diet, weight, and exercise? Eat a healthy diet  Eat a diet that includes plenty of vegetables, fruits, low-fat dairy products, and lean protein. Do not eat a lot of foods that are high in solid fats, added sugars, or sodium. Maintain a healthy weight Body mass index (BMI) is used to identify weight problems. It estimates body fat based on height and weight. Your health care provider can help determine your BMI and help you achieve or maintain a healthy weight. Get regular exercise Get regular exercise. This is one of the most important things you can do for your health. Most adults should: Exercise for at least 150 minutes each week. The exercise should increase your heart rate and make you sweat (moderate-intensity exercise). Do strengthening exercises at least twice a week. This is in addition to the moderate-intensity exercise. Spend less time sitting. Even light physical activity can be beneficial. Watch cholesterol and blood lipids Have your blood tested for lipids and cholesterol at 70 years of age, then have this test every 5 years. Have your cholesterol levels checked more often if: Your lipid or cholesterol levels are high. You are older than 70 years of age. You are at high risk for heart disease. What should I know about cancer screening? Depending on your health history and family history, you may need to have cancer screening at various ages. This may include screening for: Breast cancer. Cervical cancer. Colorectal cancer. Skin cancer. Lung cancer. What should I know about heart disease, diabetes, and high blood pressure? Blood pressure and heart  disease High blood pressure causes heart disease and increases the risk of stroke. This is more likely to develop in people who have high blood pressure readings, are of African descent, or are overweight. Have your blood pressure checked: Every 3-5 years if you are 18-39 years of age. Every year if you are 40 years old or older. Diabetes Have regular diabetes screenings. This checks your fasting blood sugar level. Have the screening done: Once every three years after age 40 if you are at a normal weight and have a low risk for diabetes. More often and at a younger age if you are overweight or have a high risk for diabetes. What should I know about preventing infection? Hepatitis B If you have a higher risk for hepatitis B, you should be screened for this virus. Talk with your health care provider to find out if you are at risk for hepatitis B infection. Hepatitis C Testing is recommended for: Everyone born from 1945 through 1965. Anyone with known risk factors for hepatitis C. Sexually transmitted infections (STIs) Get screened for STIs, including gonorrhea and chlamydia, if: You are sexually active and are younger than 70 years of age. You are older than 70 years of age and your health care provider tells you that you are at risk for this type of infection. Your sexual activity has changed since you were last screened, and you are at increased risk for chlamydia or gonorrhea. Ask your health care provider if you are at risk. Ask your health care provider about whether you are at high risk for HIV. Your health care provider may recommend a prescription medicine   to help prevent HIV infection. If you choose to take medicine to prevent HIV, you should first get tested for HIV. You should then be tested every 3 months for as long as you are taking the medicine. Pregnancy If you are about to stop having your period (premenopausal) and you may become pregnant, seek counseling before you get  pregnant. Take 400 to 800 micrograms (mcg) of folic acid every day if you become pregnant. Ask for birth control (contraception) if you want to prevent pregnancy. Osteoporosis and menopause Osteoporosis is a disease in which the bones lose minerals and strength with aging. This can result in bone fractures. If you are 65 years old or older, or if you are at risk for osteoporosis and fractures, ask your health care provider if you should: Be screened for bone loss. Take a calcium or vitamin D supplement to lower your risk of fractures. Be given hormone replacement therapy (HRT) to treat symptoms of menopause. Follow these instructions at home: Lifestyle Do not use any products that contain nicotine or tobacco, such as cigarettes, e-cigarettes, and chewing tobacco. If you need help quitting, ask your health care provider. Do not use street drugs. Do not share needles. Ask your health care provider for help if you need support or information about quitting drugs. Alcohol use Do not drink alcohol if: Your health care provider tells you not to drink. You are pregnant, may be pregnant, or are planning to become pregnant. If you drink alcohol: Limit how much you use to 0-1 drink a day. Limit intake if you are breastfeeding. Be aware of how much alcohol is in your drink. In the [place] one drink equals one 12 oz bottle of beer (355 mL), one 5 oz glass of wine (148 mL), or one 1 oz glass of hard liquor (44 mL). General instructions Schedule regular health, dental, and eye exams. Stay current with your vaccines. Tell your health care provider if: You often feel depressed. You have ever been abused or do not feel safe at home. Summary Adopting a healthy lifestyle and getting preventive care are important in promoting health and wellness. Follow your health care provider's instructions about healthy diet, exercising, and getting tested or screened for diseases. Follow your health care provider's  instructions on monitoring your cholesterol and blood pressure. This information is not intended to replace advice given to you by your health care provider. Make sure you discuss any questions you have with your health care provider. Document Revised: 08/17/2020 Document Reviewed: 06/02/2018 Elsevier Patient Education  2022 Elsevier Inc.  

## 2021-04-11 LAB — CMP14+EGFR
ALT: 11 IU/L (ref 0–32)
AST: 18 IU/L (ref 0–40)
Albumin/Globulin Ratio: 1.5 (ref 1.2–2.2)
Albumin: 4.3 g/dL (ref 3.8–4.8)
Alkaline Phosphatase: 135 IU/L — ABNORMAL HIGH (ref 44–121)
BUN/Creatinine Ratio: 11 — ABNORMAL LOW (ref 12–28)
BUN: 10 mg/dL (ref 8–27)
Bilirubin Total: 0.3 mg/dL (ref 0.0–1.2)
CO2: 27 mmol/L (ref 20–29)
Calcium: 8.9 mg/dL (ref 8.7–10.3)
Chloride: 100 mmol/L (ref 96–106)
Creatinine, Ser: 0.93 mg/dL (ref 0.57–1.00)
Globulin, Total: 2.9 g/dL (ref 1.5–4.5)
Glucose: 111 mg/dL — ABNORMAL HIGH (ref 70–99)
Potassium: 3.7 mmol/L (ref 3.5–5.2)
Sodium: 139 mmol/L (ref 134–144)
Total Protein: 7.2 g/dL (ref 6.0–8.5)
eGFR: 66 mL/min/{1.73_m2} (ref >59)

## 2021-04-11 LAB — TSH: TSH: 1.16 u[IU]/mL (ref 0.450–4.500)

## 2021-04-11 LAB — LIPID PANEL
Chol/HDL Ratio: 2.9 ratio (ref 0.0–4.4)
Cholesterol, Total: 154 mg/dL (ref 100–199)
HDL: 53 mg/dL
LDL Chol Calc (NIH): 72 mg/dL (ref 0–99)
Triglycerides: 174 mg/dL — ABNORMAL HIGH (ref 0–149)
VLDL Cholesterol Cal: 29 mg/dL (ref 5–40)

## 2021-04-11 LAB — CBC
Hematocrit: 39.1 % (ref 34.0–46.6)
Hemoglobin: 12.3 g/dL (ref 11.1–15.9)
MCH: 22.7 pg — ABNORMAL LOW (ref 26.6–33.0)
MCHC: 31.5 g/dL (ref 31.5–35.7)
MCV: 72 fL — ABNORMAL LOW (ref 79–97)
Platelets: 270 10*3/uL (ref 150–450)
RBC: 5.42 x10E6/uL — ABNORMAL HIGH (ref 3.77–5.28)
RDW: 16 % — ABNORMAL HIGH (ref 11.7–15.4)
WBC: 5.8 10*3/uL (ref 3.4–10.8)

## 2021-04-11 LAB — HEMOGLOBIN A1C
Est. average glucose Bld gHb Est-mCnc: 163 mg/dL
Hgb A1c MFr Bld: 7.3 % — ABNORMAL HIGH (ref 4.8–5.6)

## 2021-04-12 DIAGNOSIS — H2181 Floppy iris syndrome: Secondary | ICD-10-CM | POA: Diagnosis not present

## 2021-04-12 DIAGNOSIS — H21561 Pupillary abnormality, right eye: Secondary | ICD-10-CM | POA: Diagnosis not present

## 2021-04-12 DIAGNOSIS — H25811 Combined forms of age-related cataract, right eye: Secondary | ICD-10-CM | POA: Diagnosis not present

## 2021-04-17 DIAGNOSIS — E1165 Type 2 diabetes mellitus with hyperglycemia: Secondary | ICD-10-CM | POA: Diagnosis not present

## 2021-04-17 DIAGNOSIS — Z794 Long term (current) use of insulin: Secondary | ICD-10-CM | POA: Diagnosis not present

## 2021-04-22 DIAGNOSIS — M24811 Other specific joint derangements of right shoulder, not elsewhere classified: Secondary | ICD-10-CM | POA: Diagnosis not present

## 2021-04-22 DIAGNOSIS — M67911 Unspecified disorder of synovium and tendon, right shoulder: Secondary | ICD-10-CM | POA: Diagnosis not present

## 2021-04-22 DIAGNOSIS — M1711 Unilateral primary osteoarthritis, right knee: Secondary | ICD-10-CM | POA: Diagnosis not present

## 2021-04-22 DIAGNOSIS — M25561 Pain in right knee: Secondary | ICD-10-CM | POA: Diagnosis not present

## 2021-04-25 ENCOUNTER — Ambulatory Visit: Payer: Medicare Other

## 2021-05-08 ENCOUNTER — Telehealth: Payer: Self-pay

## 2021-05-08 NOTE — Chronic Care Management (AMB) (Signed)
    Chronic Care Management Pharmacy Assistant   Name: [name] [name]  MRN: [id number] DOB: 26-Sep-1950   Reason for Encounter: 2023 PAP   Medications: Outpatient Encounter Medications as of 05/08/2021  Medication Sig   Accu-Chek FastClix Lancets MISC CHECK BLOOD SUGAR THREE TIMES DAILY dx: e11.65   amLODipine (NORVASC) 10 MG tablet TAKE 1 TABLET(10 MG) BY MOUTH DAILY   Ascorbic Acid (VITAMIN C) 500 MG CHEW 1 tablet   aspirin EC 81 MG tablet Take 81 mg by mouth daily. Swallow whole.   Cholecalciferol (CVS VIT D 5000 HIGH-POTENCY PO) Take 1 tablet by mouth daily.   Coenzyme Q10 (COQ-10) 100 MG CAPS Take 1 tablet by mouth daily.    Continuous Blood Gluc Receiver (FREESTYLE LIBRE 2 READER) DEVI Use as directed to check blood sugars 3 times per day dx: e11.65   Cyanocobalamin (VITAMIN B-12 PO) Take 500 mcg by mouth.   FARXIGA 10 MG TABS tablet TAKE 1 TABLET(10 MG) BY MOUTH DAILY BEFORE AND BREAKFAST   glucosamine-chondroitin 500-400 MG tablet Take 1 tablet by mouth daily. Reported on 12/17/2015   glucose blood (ONETOUCH VERIO) test strip CHECK BLOOD SUGAR THREE TIMES DAILY dx: e11.65   insulin lispro (HUMALOG KWIKPEN) 100 UNIT/ML KwikPen Inject subcutaneously 2 units if sugars are 150 - 199, 4 units 200 -249, 6 units 250- 299, 8 units 300 - 349, 10 units >350   Insulin Pen Needle (BD PEN NEEDLE NANO 2ND GEN) 32G X 4 MM MISC USE TO INJECT INSULIN DAILY   MAGNESIUM OXIDE PO Take 400 mg by mouth daily.   meloxicam (MOBIC) 15 MG tablet Take 15 mg by mouth daily as needed.   metoprolol tartrate (LOPRESSOR) 50 MG tablet TAKE 1 TABLET BY MOUTH TWICE DAILY   Misc Natural Products (NEURIVA) CAPS Take 1 capsule by mouth daily.   nystatin (NYSTATIN) powder Apply topically 3 (three) times daily. As needed   omeprazole (PRILOSEC) 20 MG capsule TAKE 1 CAPSULE BY MOUTH DAILY   OneTouch Delica Lancets 33G MISC USE AS DIRECTED TO CHECK BLOOD SUGAR THREE TIMES DAILY   PFIZER-BIONT COVID-19 VAC-TRIS SUSP  injection    pregabalin (LYRICA) 50 MG capsule Take 1 capsule (50 mg total) by mouth at bedtime.   Psyllium (METAMUCIL FIBER PO) Take by mouth.   rosuvastatin (CRESTOR) 20 MG tablet TAKE 1 TABLET(20 MG) BY MOUTH DAILY   traMADol (ULTRAM) 50 MG tablet TAKE 1 TO 2 TABLETS BY MOUTH 2 TO 3 TIMES DAILY WHEN NECESSARY   traZODone (DESYREL) 50 MG tablet TAKE 1 TABLET BY MOUTH EVERY NIGHT AT BEDTIME   TRULICITY 1.5 MG/0.5ML SOPN ADMINISTER 1.5 MG UNDER THE SKIN 1 TIME A WEEK   Vitamin A 3 MG (10000 UT) TABS Take 1 tablet by mouth daily. 2,400 mcg   Vitamin D, Ergocalciferol, (DRISDOL) 1.25 MG (50000 UNIT) CAPS capsule TAKE 1 CAPSULE BY MOUTH EVERY 7 DAYS   vitamin E 600 UNIT capsule Take 400 Units by mouth daily.   No facility-administered encounter medications on file as of 05/08/2021.   05-08-2021: Initiated 2023 patient assistance for trulicity and tresiba. Left patient VM informing her that applications will be mailed to sign and return to the office with income verification.  Huey Romans [facility] Clinical Pharmacist Assistant [phone]

## 2021-05-09 ENCOUNTER — Telehealth: Payer: Self-pay

## 2021-05-13 ENCOUNTER — Telehealth: Payer: Self-pay

## 2021-05-13 NOTE — Chronic Care Management (AMB) (Signed)
Chronic Care Management Pharmacy Assistant   Name: [name] [name]  MRN: [id number] DOB: Mar 23, 1951   Reason for Encounter: Disease State/ Diabetes   Recent office visits:  03-21-2021 Barb Merino, LPN. Medicare annual wellness exam. Glucose in UA.  04-10-2021 [name], MD. Referral placed to Orthopedic surgery. RBC= 5.42, MCV= 72, MCH= 22.7, RDW= 16.0. Glucose= 111, BUN/Creatinine= 11, Alkaline= 135. A1C= 7.3. Trig= 174. Positive glucose in UA. Abnormal EKG. Prevnar ordered.  Recent consult visits:  None  Hospital visits:  None in previous 6 months  Medications: Outpatient Encounter Medications as of 05/13/2021  Medication Sig   Accu-Chek FastClix Lancets MISC CHECK BLOOD SUGAR THREE TIMES DAILY dx: e11.65   amLODipine (NORVASC) 10 MG tablet TAKE 1 TABLET(10 MG) BY MOUTH DAILY   Ascorbic Acid (VITAMIN C) 500 MG CHEW 1 tablet   aspirin EC 81 MG tablet Take 81 mg by mouth daily. Swallow whole.   Cholecalciferol (CVS VIT D 5000 HIGH-POTENCY PO) Take 1 tablet by mouth daily.   Coenzyme Q10 (COQ-10) 100 MG CAPS Take 1 tablet by mouth daily.    Continuous Blood Gluc Receiver (FREESTYLE LIBRE 2 READER) DEVI Use as directed to check blood sugars 3 times per day dx: e11.65   Cyanocobalamin (VITAMIN B-12 PO) Take 500 mcg by mouth.   FARXIGA 10 MG TABS tablet TAKE 1 TABLET(10 MG) BY MOUTH DAILY BEFORE AND BREAKFAST   glucosamine-chondroitin 500-400 MG tablet Take 1 tablet by mouth daily. Reported on 12/17/2015   glucose blood (ONETOUCH VERIO) test strip CHECK BLOOD SUGAR THREE TIMES DAILY dx: e11.65   insulin lispro (HUMALOG KWIKPEN) 100 UNIT/ML KwikPen Inject subcutaneously 2 units if sugars are 150 - 199, 4 units 200 -249, 6 units 250- 299, 8 units 300 - 349, 10 units >350   Insulin Pen Needle (BD PEN NEEDLE NANO 2ND GEN) 32G X 4 MM MISC USE TO INJECT INSULIN DAILY   MAGNESIUM OXIDE PO Take 400 mg by mouth daily.   meloxicam (MOBIC) 15 MG tablet Take 15 mg by mouth daily as  needed.   metoprolol tartrate (LOPRESSOR) 50 MG tablet TAKE 1 TABLET BY MOUTH TWICE DAILY   Misc Natural Products (NEURIVA) CAPS Take 1 capsule by mouth daily.   nystatin (NYSTATIN) powder Apply topically 3 (three) times daily. As needed   omeprazole (PRILOSEC) 20 MG capsule TAKE 1 CAPSULE BY MOUTH DAILY   OneTouch Delica Lancets 33G MISC USE AS DIRECTED TO CHECK BLOOD SUGAR THREE TIMES DAILY   PFIZER-BIONT COVID-19 VAC-TRIS SUSP injection    pregabalin (LYRICA) 50 MG capsule Take 1 capsule (50 mg total) by mouth at bedtime.   Psyllium (METAMUCIL FIBER PO) Take by mouth.   rosuvastatin (CRESTOR) 20 MG tablet TAKE 1 TABLET(20 MG) BY MOUTH DAILY   traMADol (ULTRAM) 50 MG tablet TAKE 1 TO 2 TABLETS BY MOUTH 2 TO 3 TIMES DAILY WHEN NECESSARY   traZODone (DESYREL) 50 MG tablet TAKE 1 TABLET BY MOUTH EVERY NIGHT AT BEDTIME   TRULICITY 1.5 MG/0.5ML SOPN ADMINISTER 1.5 MG UNDER THE SKIN 1 TIME A WEEK   Vitamin A 3 MG (10000 UT) TABS Take 1 tablet by mouth daily. 2,400 mcg   Vitamin D, Ergocalciferol, (DRISDOL) 1.25 MG (50000 UNIT) CAPS capsule TAKE 1 CAPSULE BY MOUTH EVERY 7 DAYS   vitamin E 600 UNIT capsule Take 400 Units by mouth daily.   No facility-administered encounter medications on file as of 05/13/2021.  Recent Relevant Labs: Lab Results  Component Value Date/Time  HGBA1C 7.3 (H) 04/10/2021 03:37 PM   HGBA1C 7.3 (H) 01/08/2021 10:55 AM   MICROALBUR 30 04/10/2021 05:02 PM   MICROALBUR 30 03/21/2021 10:30 AM    Kidney Function Lab Results  Component Value Date/Time   CREATININE 0.93 04/10/2021 03:37 PM   CREATININE 0.76 01/08/2021 10:55 AM   GFRNONAA 75 07/04/2020 02:36 PM   GFRAA 87 07/04/2020 02:36 PM    Current antihyperglycemic regimen:  Trulicity 1.5 mg weekly Farxiga 10 mg daily Tresiba: inject 26 units daily, max dose is 80 units (patient states she uses when needed not daily)  What recent interventions/DTPs have been made to improve glycemic control:  Educated on A1c  and blood sugar goals; Exercise goal of 150 minutes per week; Counseled to check feet daily and get yearly eye exams  Have there been any recent hospitalizations or ED visits since last visit with CPP? No  Patient denies hypoglycemic symptoms  Patient denies hyperglycemic symptoms  How often are you checking your blood sugar? twice daily  What are your blood sugars ranging?  Fasting: 110 Before meals: None After meals: 135, 161 Bedtime: None During the week, how often does your blood glucose drop below 70? Never  Are you checking your feet daily/regularly? Patient stated daily  Adherence Review: Is the patient currently on a STATIN medication? Yes Is the patient currently on ACE/ARB medication? No Does the patient have >5 day gap between last estimated fill dates? No   Care Gaps: Covid booster overdue PNA Vac overdue  Star Rating Drugs: Rosuvastatin 20 mg- Last filled 03-05-2021 90 DS [facility] Trulicity 1.5 mg- Last filled 03-07-2021 84 DS [facility] Farxiga 10 mg- Last filled 04-16-2021 30 DS [facility]  Huey Romans [facility] Clinical Pharmacist Assistant [phone]

## 2021-05-14 ENCOUNTER — Other Ambulatory Visit: Payer: Self-pay | Admitting: Internal Medicine

## 2021-05-14 DIAGNOSIS — E1122 Type 2 diabetes mellitus with diabetic chronic kidney disease: Secondary | ICD-10-CM

## 2021-05-17 DIAGNOSIS — E1165 Type 2 diabetes mellitus with hyperglycemia: Secondary | ICD-10-CM | POA: Diagnosis not present

## 2021-05-17 DIAGNOSIS — Z794 Long term (current) use of insulin: Secondary | ICD-10-CM | POA: Diagnosis not present

## 2021-05-20 DIAGNOSIS — E119 Type 2 diabetes mellitus without complications: Secondary | ICD-10-CM | POA: Diagnosis not present

## 2021-05-20 DIAGNOSIS — Z0000 Encounter for general adult medical examination without abnormal findings: Secondary | ICD-10-CM | POA: Diagnosis not present

## 2021-05-20 DIAGNOSIS — M1990 Unspecified osteoarthritis, unspecified site: Secondary | ICD-10-CM | POA: Diagnosis not present

## 2021-05-20 DIAGNOSIS — F17211 Nicotine dependence, cigarettes, in remission: Secondary | ICD-10-CM | POA: Diagnosis not present

## 2021-05-20 DIAGNOSIS — H269 Unspecified cataract: Secondary | ICD-10-CM | POA: Diagnosis not present

## 2021-05-20 DIAGNOSIS — I10 Essential (primary) hypertension: Secondary | ICD-10-CM | POA: Diagnosis not present

## 2021-05-21 DIAGNOSIS — Z1152 Encounter for screening for COVID-19: Secondary | ICD-10-CM | POA: Diagnosis not present

## 2021-05-26 NOTE — Progress Notes (Deleted)
Cardiology Office Note   Date:  05/26/2021   ID:  [name] [name], DOB [birth year], MRN [id number]  PCP:  [name], MD  Cardiologist:   Rollene Rotunda, MD   No chief complaint on file.     History of Present Illness: [name] [name] is a 70 y.o. female who presents follow up of CAD.   She had a history of a PCI in 2005.  She had a stress perfusion study in 2013 demonstrated no evidence of ischemia and well-preserved ejection fraction.  In 2018 she had a negative inadequate POET (Plain Old Exercise Treadmill).    She presents for yearly follow up.  ***    ***  Late last year she was having some increased dyspnea.  She had an echo ordered by her PCP.  I reviewed personally these images for this appt. She had normal LV function and no significant valvular abnormalities.     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  Is not had any new chest pressure, neck or arm discomfort.  She has some occasional sharp pain under her left breast.  She has some sharp pain with movement in her right shoulder.  She is not having any substernal chest discomfort.  She did have the shortness of breath as mentioned but she said this has resolved.  She is not noticing any palpitations, presyncope or syncope.  She has no weight gain or edema.    Past Medical History:  Diagnosis Date   Anemia    Carpal tunnel syndrome    Coronary artery disease    DES to ostial RCA 2005   Diabetes mellitus without complication (HCC)    No longer taking meds   Dyslipidemia    Hypertension    Osteoarthritis    PAD (peripheral artery disease) (HCC)    Sleep apnea     Past Surgical History:  Procedure Laterality Date   APPENDECTOMY     BREAST EXCISIONAL BIOPSY Right >10+ yrs ago   benign   heart stent     TONSILLECTOMY     WRIST SURGERY       Current Outpatient Medications  Medication Sig Dispense Refill    FARXIGA 10 MG TABS tablet TAKE 1 TABLET(10 MG) BY MOUTH DAILY BEFORE BREAKFAST 30 tablet 2   Accu-Chek FastClix Lancets MISC CHECK BLOOD SUGAR THREE TIMES DAILY dx: e11.65 100 each 2   amLODipine (NORVASC) 10 MG tablet TAKE 1 TABLET(10 MG) BY MOUTH DAILY 90 tablet 1   Ascorbic Acid (VITAMIN C) 500 MG CHEW 1 tablet     aspirin EC 81 MG tablet Take 81 mg by mouth daily. Swallow whole.     Cholecalciferol (CVS VIT D 5000 HIGH-POTENCY PO) Take 1 tablet by mouth daily.     Coenzyme Q10 (COQ-10) 100 MG CAPS Take 1 tablet by mouth daily.      Continuous Blood Gluc Receiver (FREESTYLE LIBRE 2 READER) DEVI Use as directed to check blood sugars 3 times per day dx: e11.65 1 each 1   Cyanocobalamin (VITAMIN B-12 PO) Take 500 mcg by mouth.     glucosamine-chondroitin 500-400 MG tablet Take 1 tablet by mouth daily. Reported on 12/17/2015     glucose blood (ONETOUCH VERIO) test strip CHECK BLOOD SUGAR THREE TIMES DAILY dx: e11.65 150 each 11   insulin lispro (HUMALOG KWIKPEN) 100 UNIT/ML KwikPen Inject subcutaneously 2 units  if sugars are 150 - 199, 4 units 200 -249, 6 units 250- 299, 8 units 300 - 349, 10 units >350 15 mL 0   Insulin Pen Needle (BD PEN NEEDLE NANO 2ND GEN) 32G X 4 MM MISC USE TO INJECT INSULIN DAILY 100 each 2   MAGNESIUM OXIDE PO Take 400 mg by mouth daily.     meloxicam (MOBIC) 15 MG tablet Take 15 mg by mouth daily as needed.     metoprolol tartrate (LOPRESSOR) 50 MG tablet TAKE 1 TABLET BY MOUTH TWICE DAILY 180 tablet 4   Misc Natural Products (NEURIVA) CAPS Take 1 capsule by mouth daily.     nystatin (NYSTATIN) powder Apply topically 3 (three) times daily. As needed 60 g 0   omeprazole (PRILOSEC) 20 MG capsule TAKE 1 CAPSULE BY MOUTH DAILY 90 capsule 1   OneTouch Delica Lancets 33G MISC USE AS DIRECTED TO CHECK BLOOD SUGAR THREE TIMES DAILY 100 each 0   PFIZER-BIONT COVID-19 VAC-TRIS SUSP injection      pregabalin (LYRICA) 50 MG capsule Take 1 capsule (50 mg total) by mouth at bedtime. 90  capsule 1   Psyllium (METAMUCIL FIBER PO) Take by mouth.     rosuvastatin (CRESTOR) 20 MG tablet TAKE 1 TABLET(20 MG) BY MOUTH DAILY 90 tablet 1   traMADol (ULTRAM) 50 MG tablet TAKE 1 TO 2 TABLETS BY MOUTH 2 TO 3 TIMES DAILY WHEN NECESSARY     traZODone (DESYREL) 50 MG tablet TAKE 1 TABLET BY MOUTH EVERY NIGHT AT BEDTIME 90 tablet 1   TRULICITY 1.5 MG/0.5ML SOPN ADMINISTER 1.5 MG UNDER THE SKIN 1 TIME A WEEK 6 mL 3   Vitamin A 3 MG (10000 UT) TABS Take 1 tablet by mouth daily. 2,400 mcg     Vitamin D, Ergocalciferol, (DRISDOL) 1.25 MG (50000 UNIT) CAPS capsule TAKE 1 CAPSULE BY MOUTH EVERY 7 DAYS 12 capsule 1   vitamin E 600 UNIT capsule Take 400 Units by mouth daily.     No current facility-administered medications for this visit.    Allergies:   Fish allergy and Latex    ROS:  Please see the history of present illness.   Otherwise, review of systems are positive for ***.   All other systems are reviewed and occasional right shooting groin pain.    PHYSICAL EXAM: VS:  There were no vitals taken for this visit. , BMI There is no height or weight on file to calculate BMI. GENERAL:  Well appearing NECK:  No jugular venous distention, waveform within normal limits, carotid upstroke brisk and symmetric, no bruits, no thyromegaly LUNGS:  Clear to auscultation bilaterally CHEST:  Unremarkable HEART:  PMI not displaced or sustained,S1 and S2 within normal limits, no S3, no S4, no clicks, no rubs, *** murmurs ABD:  Flat, positive bowel sounds normal in frequency in pitch, no bruits, no rebound, no guarding, no midline pulsatile mass, no hepatomegaly, no splenomegaly EXT:  2 plus pulses throughout, no edema, no cyanosis no clubbing     ***GENERAL:  Well appearing NECK:  No jugular venous distention, waveform within normal limits, carotid upstroke brisk and symmetric, no bruits, no thyromegaly LUNGS:  Clear to auscultation bilaterally CHEST:  Unremarkable HEART:  PMI not displaced or  sustained,S1 and S2 within normal limits, no S3, no S4, no clicks, no rubs, no murmurs ABD:  Flat, positive bowel sounds normal in frequency in pitch, no bruits, no rebound, no guarding, no midline pulsatile mass, no hepatomegaly, no splenomegaly EXT:  2 plus pulses upper, mildly diminished dorsalis pedis and posterior best bilaterally, no edema, no cyanosis no clubbing   EKG:  EKG is *** ordered today. The ekg ordered today demonstrates sinus rhythm, rate *** premature ectopic complexes, low voltage on the limb leads, poor anterior R wave progression, nonspecific T wave flattening.   Recent Labs: 04/10/2021: ALT 11; BUN 10; Creatinine, Ser 0.93; Hemoglobin 12.3; Platelets 270; Potassium 3.7; Sodium 139; TSH 1.160    Lipid Panel    Component Value Date/Time   CHOL 154 04/10/2021 1537   TRIG 174 (H) 04/10/2021 1537   HDL 53 04/10/2021 1537   CHOLHDL 2.9 04/10/2021 1537   CHOLHDL 3.0 Ratio 09/16/2010 2137   VLDL 39 09/16/2010 2137   LDLCALC 72 04/10/2021 1537      Wt Readings from Last 3 Encounters:  04/10/21 180 lb (81.6 kg)  03/21/21 180 lb 6.4 oz (81.8 kg)  01/08/21 185 lb 6.4 oz (84.1 kg)      Other studies Reviewed: Additional studies/ records that were reviewed today include: *** Review of the above records demonstrates:  Please see elsewhere in the note.     ASSESSMENT AND PLAN:  CAD:  ***   The patient has some atypical discomfort.  She had an inadequate but negative stress test in 2018.  I do not think that further imaging is indicated since she is not having any symptoms consistent with unstable angina and the dyspnea that she was describing has improved.    DYSLIPIDEMIA:   LDL was *** 62.  No change in therapy.   DM:  Her A1c was ***  7.9 but is being followed closely by Dr. Allyne Gee, Melina Schools, MD this is going to be checked soon   HTN: Her blood pressure is *** slightly elevated today but this is quite unusual.  She will keep a blood pressure diary.  No change in  therapy.   SLEEP APNEA:    ***  She does have CPAP but she does not wear it.  We talked about this today.    Current medicines are reviewed at length with the patient today.  The patient does not have concerns regarding medicines.  The following changes have been made:  ***  Labs/ tests ordered today include: ***    No orders of the defined types were placed in this encounter.    Disposition:   FU with me in *** year.     Signed, Rollene Rotunda, MD  05/26/2021 10:18 AM    [facility]

## 2021-05-27 ENCOUNTER — Encounter: Payer: Self-pay | Admitting: Internal Medicine

## 2021-05-27 ENCOUNTER — Ambulatory Visit: Payer: Medicare Other | Admitting: Cardiology

## 2021-05-27 DIAGNOSIS — Z1152 Encounter for screening for COVID-19: Secondary | ICD-10-CM | POA: Diagnosis not present

## 2021-05-28 ENCOUNTER — Other Ambulatory Visit: Payer: Self-pay

## 2021-05-28 ENCOUNTER — Encounter: Payer: Self-pay | Admitting: Nurse Practitioner

## 2021-05-28 ENCOUNTER — Ambulatory Visit (INDEPENDENT_AMBULATORY_CARE_PROVIDER_SITE_OTHER): Payer: Medicare Other | Admitting: Nurse Practitioner

## 2021-05-28 VITALS — BP 118/80 | HR 60 | Temp 98.1°F | Ht 64.0 in | Wt 178.2 lb

## 2021-05-28 DIAGNOSIS — E162 Hypoglycemia, unspecified: Secondary | ICD-10-CM | POA: Diagnosis not present

## 2021-05-28 DIAGNOSIS — R1032 Left lower quadrant pain: Secondary | ICD-10-CM | POA: Diagnosis not present

## 2021-05-28 DIAGNOSIS — Z6833 Body mass index (BMI) 33.0-33.9, adult: Secondary | ICD-10-CM

## 2021-05-28 DIAGNOSIS — E6609 Other obesity due to excess calories: Secondary | ICD-10-CM | POA: Diagnosis not present

## 2021-05-28 LAB — POCT URINALYSIS DIPSTICK
Bilirubin, UA: NEGATIVE
Blood, UA: NEGATIVE
Glucose, UA: POSITIVE — AB
Ketones, UA: NEGATIVE
Leukocytes, UA: NEGATIVE
Nitrite, UA: NEGATIVE
Protein, UA: NEGATIVE
Spec Grav, UA: 1.01 (ref 1.010–1.025)
Urobilinogen, UA: 0.2 E.U./dL
pH, UA: 6.5 (ref 5.0–8.0)

## 2021-05-28 MED ORDER — KETOROLAC TROMETHAMINE 60 MG/2ML IM SOLN
60.0000 mg | Freq: Once | INTRAMUSCULAR | Status: AC
  Administered 2021-05-28: 60 mg via INTRAMUSCULAR

## 2021-05-28 MED ORDER — TRAMADOL HCL 50 MG PO TABS: 50.0000 mg | ORAL_TABLET | Freq: Four times a day (QID) | ORAL | 0 refills | Status: DC | PRN

## 2021-05-28 NOTE — Patient Instructions (Signed)
Pelvic Pain, Female °Pelvic pain is pain in your lower belly (abdomen), below your belly button and between your hips. The pain may: °Start all of a sudden (be acute). °Keep coming back (be recurring). °Last a long time (become chronic). Pelvic pain that lasts longer than 6 months is called chronic pelvic pain. °There are many causes of pelvic pain. Sometimes the cause of pelvic pain is not known. °Follow these instructions at home: ° °Take over-the-counter and prescription medicines only as told by your doctor. °Rest as told by your doctor. °Do not have sex if it hurts. °Keep a journal of your pelvic pain. Write down: °When the pain started. °Where the pain is located. °What seems to make the pain better or worse, such as food or your monthly period (menstrual cycle). °Any symptoms you have along with the pain. °Keep all follow-up visits. °Contact a doctor if: °Medicine does not help your pain, or your pain comes back. °You have new symptoms. °You have unusual discharge or bleeding from your vagina. °You have a fever or chills. °You are having trouble pooping (constipation). °You have blood in your pee (urine) or poop (stool). °Your pee smells bad. °You feel weak or light-headed. °Get help right away if: °You have sudden pain that is very bad. °You have very bad pain and also have any of these symptoms: °A fever. °Feeling like you may vomit (nauseous). °Vomiting. °Being very sweaty. °You faint. °These symptoms may be an emergency. Get help right away. Call your local emergency services (911 in the [place] °Do not wait to see if the symptoms will go away. °Do not drive yourself to the hospital. °Summary °Pelvic pain is pain in your lower belly (abdomen), below your belly button and between your hips. °There are many causes of pelvic pain. °Keep a journal of your pelvic pain. °This information is not intended to replace advice given to you by your health care provider. Make sure you discuss any questions you have with  your health care provider. °Document Revised: 10/16/2020 Document Reviewed: 10/16/2020 °Elsevier Patient Education © 2022 [employer] ° °

## 2021-05-28 NOTE — Progress Notes (Signed)
I,[name],acting as a Neurosurgeon for Arnette Felts, FNP.,have documented all relevant documentation on the behalf of Arnette Felts, FNP,as directed by  Arnette Felts, FNP while in the presence of Arnette Felts, FNP.   This visit occurred during the SARS-CoV-2 public health emergency.  Safety protocols were in place, including screening questions prior to the visit, additional usage of staff PPE, and extensive cleaning of exam room while observing appropriate contact time as indicated for disinfecting solutions.  Subjective:     Patient ID: [name] [name] , female    DOB: [birth year] , 70 y.o.   MRN: [id number]   Chief Complaint  Patient presents with   Groin Pain    HPI  Pt presents today for groin pain. Left groin area, describes as sharp. When the pain is present it stops her from doing anything. She initially felt the pain on Saturday, consistent since. She has been taking tylenol OTC, she reports it is not working. Rates 9/10. Denies any falls or pulling on anything.     Past Medical History:  Diagnosis Date   Anemia    Carpal tunnel syndrome    Coronary artery disease    DES to ostial RCA 2005   Diabetes mellitus without complication (HCC)    No longer taking meds   Dyslipidemia    Hypertension    Osteoarthritis    PAD (peripheral artery disease) (HCC)    Sleep apnea      Family History  Problem Relation Age of Onset   CAD Father 41   Heart attack Father    CAD Mother 64   Heart attack Mother    CAD Brother    Cancer Brother        prostate   CAD Sister    Cancer Sister        breast   CAD Brother      Current Outpatient Medications:    Accu-Chek FastClix Lancets MISC, CHECK BLOOD SUGAR THREE TIMES DAILY dx: e11.65, Disp: 100 each, Rfl: 2   amLODipine (NORVASC) 10 MG tablet, TAKE 1 TABLET(10 MG) BY MOUTH DAILY, Disp: 90 tablet, Rfl: 1   Ascorbic Acid (VITAMIN C) 500 MG CHEW, 1 tablet, Disp: , Rfl:    aspirin EC 81 MG tablet, Take 81 mg by mouth daily.  Swallow whole., Disp: , Rfl:    Cholecalciferol ([employer] VIT D 5000 HIGH-POTENCY PO), Take 1 tablet by mouth daily., Disp: , Rfl:    Coenzyme Q10 (COQ-10) 100 MG CAPS, Take 1 tablet by mouth daily. , Disp: , Rfl:    Continuous Blood Gluc Receiver (FREESTYLE LIBRE 2 READER) DEVI, Use as directed to check blood sugars 3 times per day dx: e11.65, Disp: 1 each, Rfl: 1   Cyanocobalamin (VITAMIN B-12 PO), Take 500 mcg by mouth., Disp: , Rfl:    FARXIGA 10 MG TABS tablet, TAKE 1 TABLET(10 MG) BY MOUTH DAILY BEFORE BREAKFAST, Disp: 30 tablet, Rfl: 2   glucosamine-chondroitin 500-400 MG tablet, Take 1 tablet by mouth daily. Reported on 12/17/2015, Disp: , Rfl:    glucose blood (ONETOUCH VERIO) test strip, CHECK BLOOD SUGAR THREE TIMES DAILY dx: e11.65, Disp: 150 each, Rfl: 11   insulin lispro (HUMALOG KWIKPEN) 100 UNIT/ML KwikPen, Inject subcutaneously 2 units if sugars are 150 - 199, 4 units 200 -249, 6 units 250- 299, 8 units 300 - 349, 10 units >350, Disp: 15 mL, Rfl: 0   Insulin Pen Needle (BD PEN NEEDLE NANO 2ND GEN) 32G X 4 MM MISC, USE  TO INJECT INSULIN DAILY, Disp: 100 each, Rfl: 2   MAGNESIUM OXIDE PO, Take 400 mg by mouth daily., Disp: , Rfl:    meloxicam (MOBIC) 15 MG tablet, Take 15 mg by mouth daily as needed., Disp: , Rfl:    metoprolol tartrate (LOPRESSOR) 50 MG tablet, TAKE 1 TABLET BY MOUTH TWICE DAILY, Disp: 180 tablet, Rfl: 4   Misc Natural Products (NEURIVA) CAPS, Take 1 capsule by mouth daily., Disp: , Rfl:    nystatin (NYSTATIN) powder, Apply topically 3 (three) times daily. As needed, Disp: 60 g, Rfl: 0   omeprazole (PRILOSEC) 20 MG capsule, TAKE 1 CAPSULE BY MOUTH DAILY, Disp: 90 capsule, Rfl: 1   OneTouch Delica Lancets 33G MISC, USE AS DIRECTED TO CHECK BLOOD SUGAR THREE TIMES DAILY, Disp: 100 each, Rfl: 0   PFIZER-BIONT COVID-19 VAC-TRIS SUSP injection, , Disp: , Rfl:    pregabalin (LYRICA) 50 MG capsule, Take 1 capsule (50 mg total) by mouth at bedtime., Disp: 90 capsule, Rfl: 1    Psyllium (METAMUCIL FIBER PO), Take by mouth., Disp: , Rfl:    rosuvastatin (CRESTOR) 20 MG tablet, TAKE 1 TABLET(20 MG) BY MOUTH DAILY, Disp: 90 tablet, Rfl: 1   traMADol (ULTRAM) 50 MG tablet, Take 1 tablet (50 mg total) by mouth every 6 (six) hours as needed., Disp: 20 tablet, Rfl: 0   traZODone (DESYREL) 50 MG tablet, TAKE 1 TABLET BY MOUTH EVERY NIGHT AT BEDTIME, Disp: 90 tablet, Rfl: 1   TRULICITY 1.5 MG/0.5ML SOPN, ADMINISTER 1.5 MG UNDER THE SKIN 1 TIME A WEEK, Disp: 6 mL, Rfl: 3   Vitamin A 3 MG (10000 UT) TABS, Take 1 tablet by mouth daily. 2,400 mcg, Disp: , Rfl:    Vitamin D, Ergocalciferol, (DRISDOL) 1.25 MG (50000 UNIT) CAPS capsule, TAKE 1 CAPSULE BY MOUTH EVERY 7 DAYS, Disp: 12 capsule, Rfl: 1   vitamin E 600 UNIT capsule, Take 400 Units by mouth daily., Disp: , Rfl:    Allergies  Allergen Reactions   Fish Allergy     Cod   Latex Rash     Review of Systems  Constitutional: Negative.   Respiratory: Negative.    Cardiovascular: Negative.  Negative for chest pain and leg swelling.  Endocrine: Negative for polydipsia, polyphagia and polyuria.  Genitourinary:        Left groin pain.   Neurological: Negative.   Psychiatric/Behavioral: Negative.      Today's Vitals   05/28/21 1021  BP: 118/80  Pulse: 60  Temp: 98.1 F (36.7 C)  SpO2: 97%  Weight: 178 lb 3.2 oz (80.8 kg)  Height: 5\' 4"  (1.626 m)   Body mass index is 30.59 kg/m.  Wt Readings from Last 3 Encounters:  05/28/21 178 lb 3.2 oz (80.8 kg)  04/10/21 180 lb (81.6 kg)  03/21/21 180 lb 6.4 oz (81.8 kg)    Objective:  Physical Exam Vitals reviewed.  Constitutional:      General: She is not in acute distress.    Appearance: Normal appearance.  Cardiovascular:     Rate and Rhythm: Normal rate and regular rhythm.     Pulses: Normal pulses.     Heart sounds: Normal heart sounds. No murmur heard. Pulmonary:     Effort: Pulmonary effort is normal. No respiratory distress.     Breath sounds: Normal breath  sounds. No wheezing.  Abdominal:     General: Bowel sounds are normal. There is no distension.     Palpations: Abdomen is soft.  Tenderness: There is no abdominal tenderness.     Hernia: No hernia is present.     Comments: Bulging noted to mid upper abdomen. She has mild tenderness to left lower abdomen  Neurological:     General: No focal deficit present.     Mental Status: She is alert and oriented to person, place, and time.     Cranial Nerves: No cranial nerve deficit.     Motor: No weakness.  Psychiatric:        Mood and Affect: Mood normal.        Behavior: Behavior normal.        Thought Content: Thought content normal.        Judgment: Judgment normal.        Assessment And Plan:     1. Left groin pain - US Abdomen Complete; Future - ketorolac (TORADOL) injection 60 mg - traMADol (ULTRAM) 50 MG tablet; Take 1 tablet (50 mg total) by mouth every 6 (six) hours as needed.  Dispense: 20 tablet; Refill: 0 - POCT Urinalysis Dipstick (81002)  2. Class 1 obesity due to excess calories with serious comorbidity and body mass index (BMI) of 33.0 to 33.9 in adult Chronic Discussed healthy diet and regular exercise options  Encouraged to exercise at least 150 minutes per week with 2 days of strength training  3. Left lower quadrant abdominal pain - US Abdomen Complete; Future - POCT Urinalysis Dipstick (81002)  4. Hypoglycemia Comments: Alert went off on her Josephine Igo, her glucose was 68 she did not eat prior to coming to office. given orange, peanut butter and crackers.   She is encouraged to strive for BMI less than 30 to decrease cardiac risk. Advised to aim for at least 150 minutes of exercise per week.   Patient was given opportunity to ask questions. Patient verbalized understanding of the plan and was able to repeat key elements of the plan. All questions were answered to their satisfaction.  Arnette Felts, FNP   I, Arnette Felts, FNP, have reviewed all documentation for  this visit. The documentation on 05/28/21 for the exam, diagnosis, procedures, and orders are all accurate and complete.   IF YOU HAVE BEEN REFERRED TO A SPECIALIST, IT MAY TAKE 1-2 WEEKS TO SCHEDULE/PROCESS THE REFERRAL. IF YOU HAVE NOT HEARD FROM US/SPECIALIST IN TWO WEEKS, PLEASE GIVE [place] A CALL AT [phone].   THE PATIENT IS ENCOURAGED TO PRACTICE SOCIAL DISTANCING DUE TO THE COVID-19 PANDEMIC.

## 2021-05-29 NOTE — Addendum Note (Signed)
Addended by: Arnette Felts F on: 05/29/2021 03:02 PM   Modules accepted: Orders

## 2021-05-30 ENCOUNTER — Ambulatory Visit
Admission: RE | Admit: 2021-05-30 | Discharge: 2021-05-30 | Disposition: A | Discharge status: Home / Self Care | Payer: Medicare Other | Source: Ambulatory Visit | Attending: Nurse Practitioner | Admitting: Nurse Practitioner

## 2021-05-30 DIAGNOSIS — R1032 Left lower quadrant pain: Secondary | ICD-10-CM | POA: Diagnosis not present

## 2021-06-03 ENCOUNTER — Emergency Department (HOSPITAL_COMMUNITY): Payer: Medicare Other

## 2021-06-03 ENCOUNTER — Encounter (HOSPITAL_COMMUNITY): Payer: Self-pay

## 2021-06-03 ENCOUNTER — Emergency Department (HOSPITAL_COMMUNITY)
Admission: EM | Admit: 2021-06-03 | Discharge: 2021-06-03 | Disposition: A | Discharge status: Home / Self Care | Payer: Medicare Other | Attending: Emergency Medicine | Admitting: Emergency Medicine

## 2021-06-03 ENCOUNTER — Other Ambulatory Visit: Payer: Self-pay

## 2021-06-03 DIAGNOSIS — Z79899 Other long term (current) drug therapy: Secondary | ICD-10-CM | POA: Insufficient documentation

## 2021-06-03 DIAGNOSIS — Z87891 Personal history of nicotine dependence: Secondary | ICD-10-CM | POA: Insufficient documentation

## 2021-06-03 DIAGNOSIS — R1032 Left lower quadrant pain: Secondary | ICD-10-CM | POA: Insufficient documentation

## 2021-06-03 DIAGNOSIS — R109 Unspecified abdominal pain: Secondary | ICD-10-CM | POA: Diagnosis not present

## 2021-06-03 DIAGNOSIS — Z794 Long term (current) use of insulin: Secondary | ICD-10-CM | POA: Insufficient documentation

## 2021-06-03 DIAGNOSIS — E119 Type 2 diabetes mellitus without complications: Secondary | ICD-10-CM | POA: Insufficient documentation

## 2021-06-03 DIAGNOSIS — I10 Essential (primary) hypertension: Secondary | ICD-10-CM | POA: Diagnosis not present

## 2021-06-03 DIAGNOSIS — Z91040 Latex allergy status: Secondary | ICD-10-CM | POA: Diagnosis not present

## 2021-06-03 DIAGNOSIS — M7918 Myalgia, other site: Secondary | ICD-10-CM

## 2021-06-03 DIAGNOSIS — R1030 Lower abdominal pain, unspecified: Secondary | ICD-10-CM

## 2021-06-03 LAB — CBC WITH DIFFERENTIAL/PLATELET
Abs Immature Granulocytes: 0 10*3/uL (ref 0.00–0.07)
Basophils Absolute: 0 10*3/uL (ref 0.0–0.1)
Basophils Relative: 0 %
Eosinophils Absolute: 0.1 10*3/uL (ref 0.0–0.5)
Eosinophils Relative: 2 %
HCT: 41.2 % (ref 36.0–46.0)
Hemoglobin: 12.6 g/dL (ref 12.0–15.0)
Immature Granulocytes: 0 %
Lymphocytes Relative: 42 %
Lymphs Abs: 2.2 10*3/uL (ref 0.7–4.0)
MCH: 22.7 pg — ABNORMAL LOW (ref 26.0–34.0)
MCHC: 30.6 g/dL (ref 30.0–36.0)
MCV: 74.4 fL — ABNORMAL LOW (ref 80.0–100.0)
Monocytes Absolute: 0.5 10*3/uL (ref 0.1–1.0)
Monocytes Relative: 9 %
Neutro Abs: 2.5 10*3/uL (ref 1.7–7.7)
Neutrophils Relative %: 47 %
Platelets: 284 10*3/uL (ref 150–400)
RBC: 5.54 MIL/uL — ABNORMAL HIGH (ref 3.87–5.11)
RDW: 16.6 % — ABNORMAL HIGH (ref 11.5–15.5)
WBC: 5.3 10*3/uL (ref 4.0–10.5)
nRBC: 0 % (ref 0.0–0.2)

## 2021-06-03 LAB — COMPREHENSIVE METABOLIC PANEL
ALT: 14 U/L (ref 0–44)
AST: 23 U/L (ref 15–41)
Albumin: 4.4 g/dL (ref 3.5–5.0)
Alkaline Phosphatase: 111 U/L (ref 38–126)
Anion gap: 7 (ref 5–15)
BUN: 9 mg/dL (ref 8–23)
CO2: 29 mmol/L (ref 22–32)
Calcium: 9 mg/dL (ref 8.9–10.3)
Chloride: 103 mmol/L (ref 98–111)
Creatinine, Ser: 0.77 mg/dL (ref 0.44–1.00)
GFR, Estimated: >60 mL/min (ref >60)
Glucose, Bld: 100 mg/dL — ABNORMAL HIGH (ref 70–99)
Potassium: 4.4 mmol/L (ref 3.5–5.1)
Sodium: 139 mmol/L (ref 135–145)
Total Bilirubin: 0.8 mg/dL (ref 0.3–1.2)
Total Protein: 7.9 g/dL (ref 6.5–8.1)

## 2021-06-03 LAB — URINALYSIS, ROUTINE W REFLEX MICROSCOPIC
Bilirubin Urine: NEGATIVE
Glucose, UA: 150 mg/dL — AB
Hgb urine dipstick: NEGATIVE
Ketones, ur: NEGATIVE mg/dL
Nitrite: NEGATIVE
Protein, ur: NEGATIVE mg/dL
Specific Gravity, Urine: 1.026 (ref 1.005–1.030)
pH: 7 (ref 5.0–8.0)

## 2021-06-03 LAB — CBG MONITORING, ED: Glucose-Capillary: 70 mg/dL (ref 70–99)

## 2021-06-03 LAB — LIPASE, BLOOD: Lipase: 36 U/L (ref 11–51)

## 2021-06-03 MED ORDER — IOHEXOL 350 MG/ML SOLN
80.0000 mL | Freq: Once | INTRAVENOUS | Status: AC | PRN
  Administered 2021-06-03: 80 mL via INTRAVENOUS

## 2021-06-03 MED ORDER — SODIUM CHLORIDE (PF) 0.9 % IJ SOLN
INTRAMUSCULAR | Status: AC
  Filled 2021-06-03: qty 50

## 2021-06-03 NOTE — ED Provider Notes (Signed)
Emergency Medicine Provider Triage Evaluation Note  [name] [name] , a 70 y.o. female  was evaluated in triage.  Pt complains of abdominal pain. Similar pain 1.5 weeks ago that resolved with an injection. Came back on this morning.  Review of Systems  Positive: Abdominal pain Negative: Fever, chills, dysuria, N/V.   Physical Exam  BP 129/83 (BP Location: Left Arm)   Pulse (!) 58   Temp 99.1 F (37.3 C) (Oral)   Resp 16   Ht 5\' 6"  (1.676 m)   Wt 81.2 kg   SpO2 97%   BMI 28.89 kg/m  Gen:   Awake, no distress   Resp:  Normal effort  MSK:   Moves extremities without difficulty  Other:  Mild TTP on LLQ and left flank  Medical Decision Making  Medically screening exam initiated at 12:18 PM.  Appropriate orders placed.  [name] [name] was informed that the remainder of the evaluation will be completed by another provider, this initial triage assessment does not replace that evaluation, and the importance of remaining in the ED until their evaluation is complete.     [name], PA-C 06/03/21 1220    14/12/22, MD 06/04/21 1655

## 2021-06-03 NOTE — ED Notes (Signed)
Pt in CT.

## 2021-06-03 NOTE — ED Provider Notes (Addendum)
[facility] DEPT Provider Note   CSN: [id number] Arrival date & time: 06/03/21  1105     History Chief Complaint  Patient presents with   Abdominal Pain    [name] [name] is a 70 y.o. female.  HPI    70 year old female comes in with chief complaint of abdominal pain.  She has history of diabetes, CAD and diverticulosis.  She reports about 2-week history of abdominal pain in the left side.  The pain is intermittent, but today it was more severe and constant.  She had seen her PCP for this pain and ultrasound was completed, which had shown left-sided ovarian cyst.  Since the pain was excruciating and constant today, she was advised to come to the ER.  While in the waiting room, her pain had resolved.  Patient reports that her pain is worse with movement.  She denies any burning with urination, urinary frequency.  She does indicate she is constipated.  No bloody stools.  No history of similar symptoms.  She denies any chest pain, shortness of breath or new cough.  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   Educated about COVID-19 virus infection 03/25/2020   OSA and COPD overlap syndrome ([place]) 03/14/2019   Snoring 02/17/2019   Peripheral arterial disease ([place]) 01/26/2019   Hepatitis C carrier ([place]) 01/26/2019   Hypertensive heart and renal disease 08/19/2018   Class 1 obesity due to excess calories with serious comorbidity and body mass index (BMI) of 33.0 to 33.9 in adult 08/19/2018   Paresthesia of left upper extremity 06/29/2018   Atherosclerosis of native coronary artery of native  heart with angina pectoris ([place]) 04/09/2018   Dyslipidemia 04/09/2018   Primary osteoarthritis of both feet 11/05/2017   Primary osteoarthritis of both hands 10/30/2017   Primary osteoarthritis of both knees 10/30/2017   Primary insomnia 10/30/2017   Family history of lupus erythematosus 10/30/2017   Dyspnea on exertion 12/10/2016   Type 2 diabetes mellitus with stage 2 chronic kidney disease, with long-term current use of insulin ([place]) 09/21/2014   Acute cystitis 09/21/2014   Hyperglycemia 09/19/2014   Bradycardia 09/19/2014   Hyponatremia 09/19/2014   Elevated alkaline phosphatase level 09/19/2014   Acute kidney injury [facility])    Essential hypertension, benign 07/05/2008   Coronary atherosclerosis 07/05/2008   TOBACCO USER 07/03/2008    Past Surgical History:  Procedure Laterality Date   APPENDECTOMY     BREAST EXCISIONAL BIOPSY Right >10+ yrs ago   benign   heart stent     TONSILLECTOMY     WRIST SURGERY       OB History   No obstetric history on file.     Family History  Problem Relation Age of Onset   CAD Father 17   Heart attack Father    CAD Mother 67   Heart attack Mother    CAD Brother    Cancer Brother        prostate   CAD Sister    Cancer Sister  breast   CAD Brother     Social History   Tobacco Use   Smoking status: Former    Packs/day: 0.50    Years: 30.00    Pack years: 15.00    Types: Cigarettes    Quit date: 12/20/2013    Years since quitting: 7.4   Smokeless tobacco: Never  Vaping Use   Vaping Use: Never used  Substance Use Topics   Alcohol use: Yes    Comment: rarely   Drug use: Not Currently    Comment: quit 2013    Home Medications Prior to Admission medications   Medication Sig Start Date End Date Taking? Authorizing Provider  Accu-Chek FastClix Lancets MISC CHECK BLOOD SUGAR THREE TIMES DAILY dx: e11.65 04/05/21   [name], MD  amLODipine (NORVASC) 10 MG tablet TAKE 1 TABLET(10 MG) BY MOUTH DAILY 03/29/21    [name], MD  Ascorbic Acid (VITAMIN C) 500 MG CHEW 1 tablet    [provider]  aspirin EC 81 MG tablet Take 81 mg by mouth daily. Swallow whole.    [provider]  Cholecalciferol ([employer] VIT D 5000 HIGH-POTENCY PO) Take 1 tablet by mouth daily.    [provider]  Coenzyme Q10 (COQ-10) 100 MG CAPS Take 1 tablet by mouth daily.     [provider]  Continuous Blood Gluc Receiver (FREESTYLE LIBRE 2 READER) DEVI Use as directed to check blood sugars 3 times per day dx: e11.65 01/03/20   [name], MD  Cyanocobalamin (VITAMIN B-12 PO) Take 500 mcg by mouth.    [provider]  FARXIGA 10 MG TABS tablet TAKE 1 TABLET(10 MG) BY MOUTH DAILY BEFORE BREAKFAST 05/14/21   [name], MD  glucosamine-chondroitin 500-400 MG tablet Take 1 tablet by mouth daily. Reported on 12/17/2015    [provider]  glucose blood (ONETOUCH VERIO) test strip CHECK BLOOD SUGAR THREE TIMES DAILY dx: e11.65 03/23/18   [name], MD  insulin lispro (HUMALOG KWIKPEN) 100 UNIT/ML KwikPen Inject subcutaneously 2 units if sugars are 150 - 199, 4 units 200 -249, 6 units 250- 299, 8 units 300 - 349, 10 units >350 10/18/20   [name], MD  Insulin Pen Needle (BD PEN NEEDLE NANO 2ND GEN) 32G X 4 MM MISC USE TO INJECT INSULIN DAILY 09/27/20   [name], MD  MAGNESIUM OXIDE PO Take 400 mg by mouth daily.    [provider]  meloxicam (MOBIC) 15 MG tablet Take 15 mg by mouth daily as needed. 01/04/21   [provider]  metoprolol tartrate (LOPRESSOR) 50 MG tablet TAKE 1 TABLET BY MOUTH TWICE DAILY 10/16/20   [name], MD  Misc Natural Products (NEURIVA) CAPS Take 1 capsule by mouth daily.    [provider]  nystatin (NYSTATIN) powder Apply topically 3 (three) times daily. As needed 03/23/19   [name], MD  omeprazole (PRILOSEC) 20 MG capsule TAKE 1 CAPSULE BY MOUTH DAILY 02/14/21   [name], MD  OneTouch Delica Lancets  33G MISC USE AS DIRECTED TO CHECK BLOOD SUGAR THREE TIMES DAILY 04/18/20   [name], MD  PFIZER-BIONT COVID-19 VAC-TRIS SUSP injection  11/23/20   [provider]  pregabalin (LYRICA) 50 MG capsule Take 1 capsule (50 mg total) by mouth at bedtime. 04/09/21   [name], MD  Psyllium (METAMUCIL FIBER PO) Take by mouth.    [provider]  rosuvastatin (CRESTOR) 20 MG tablet TAKE 1 TABLET(20 MG) BY MOUTH DAILY 03/05/21  Rollene Rotunda, MD  traMADol (ULTRAM) 50 MG tablet Take 1 tablet (50 mg total) by mouth every 6 (six) hours as needed. 05/28/21 05/28/22  Arnette Felts, FNP  traZODone (DESYREL) 50 MG tablet TAKE 1 TABLET BY MOUTH EVERY NIGHT AT BEDTIME 02/19/21   [name], MD  TRULICITY 1.5 MG/0.5ML [facility] ADMINISTER 1.5 MG UNDER THE SKIN 1 TIME A WEEK 12/11/20   [name], MD  Vitamin A 3 MG (10000 UT) TABS Take 1 tablet by mouth daily. 2,400 mcg    [provider]  Vitamin D, Ergocalciferol, (DRISDOL) 1.25 MG (50000 UNIT) CAPS capsule TAKE 1 CAPSULE BY MOUTH EVERY 7 DAYS 02/04/21   [name], MD  vitamin E 600 UNIT capsule Take 400 Units by mouth daily.    [provider]    Allergies    Fish allergy and Latex  Review of Systems   Review of Systems  Constitutional:  Positive for activity change.  Respiratory:  Negative for shortness of breath.   Cardiovascular:  Negative for chest pain.  Gastrointestinal:  Positive for abdominal pain. Negative for nausea and vomiting.  Genitourinary:  Negative for dysuria.  Allergic/Immunologic: Negative for immunocompromised state.  Hematological:  Does not bruise/bleed easily.  All other systems reviewed and are negative.  Physical Exam Updated Vital Signs BP (!) 158/96   Pulse 83   Temp 98.5 F (36.9 C)   Resp 16   Ht 5\' 6"  (1.676 m)   Wt 81.2 kg   SpO2 94%   BMI 28.89 kg/m   Physical Exam Vitals and nursing note reviewed.  Constitutional:      Appearance: She is well-developed.   HENT:     Head: Atraumatic.  Cardiovascular:     Rate and Rhythm: Normal rate.  Pulmonary:     Effort: Pulmonary effort is normal.  Abdominal:     Tenderness: There is no abdominal tenderness.  Musculoskeletal:     Cervical back: Normal range of motion and neck supple.  Skin:    General: Skin is warm and dry.  Neurological:     Mental Status: She is alert and oriented to person, place, and time.    ED Results / Procedures / Treatments   Labs (all labs ordered are listed, but only abnormal results are displayed) Labs Reviewed  CBC WITH DIFFERENTIAL/PLATELET - Abnormal; Notable for the following components:      Result Value   RBC 5.54 (*)    MCV 74.4 (*)    MCH 22.7 (*)    RDW 16.6 (*)    All other components within normal limits  COMPREHENSIVE METABOLIC PANEL - Abnormal; Notable for the following components:   Glucose, Bld 100 (*)    All other components within normal limits  URINALYSIS, ROUTINE W REFLEX MICROSCOPIC - Abnormal; Notable for the following components:   APPearance HAZY (*)    Glucose, UA 150 (*)    Leukocytes,Ua SMALL (*)    Bacteria, UA RARE (*)    All other components within normal limits  LIPASE, BLOOD  CBG MONITORING, ED    EKG None  Radiology CT ABDOMEN PELVIS W CONTRAST  Result Date: 06/03/2021 CLINICAL DATA:  Left lower quadrant abdominal pain EXAM: CT ABDOMEN AND PELVIS WITH CONTRAST TECHNIQUE: Multidetector CT imaging of the abdomen and pelvis was performed using the standard protocol following bolus administration of intravenous contrast. CONTRAST:  31mL OMNIPAQUE IOHEXOL 350 MG/ML SOLN COMPARISON:  CT 01/01/2016.  Pelvic ultrasound 05/30/2021 FINDINGS: Lower chest: Included lung bases are  clear. Heart size within normal limits. Hepatobiliary: No focal liver abnormality is seen. No gallstones, gallbladder wall thickening, or biliary dilatation. Pancreas: Unremarkable. No pancreatic ductal dilatation or surrounding inflammatory changes. Spleen:  Normal in size without focal abnormality. Adrenals/Urinary Tract: Stable nodular configuration of the left adrenal gland unremarkable right adrenal gland. 2.7 cm cyst at the upper pole of the left kidney. 1.1 cm right renal cyst. Kidneys are otherwise unremarkable. No renal stone or hydronephrosis. Urinary bladder within normal limits. Stomach/Bowel: Stomach is within normal limits. No dilated loops of bowel. Moderate sigmoid diverticulosis. No focal bowel wall thickening or inflammatory changes. Appendix surgically absent. Vascular/Lymphatic: Aortic atherosclerosis. No enlarged abdominal or pelvic lymph nodes. Reproductive: Uterus atrophic. Small left ovarian cyst, recently characterized as benign on dedicated pelvic ultrasound 05/30/2021. Adnexa regions are otherwise within normal limits. Other: No free fluid. No abdominopelvic fluid collection. No pneumoperitoneum. Small fat containing umbilical and supraumbilical hernias. Musculoskeletal: 5.2 x 2.1 cm intramuscular lipoma within the left gluteus minimus muscle, unchanged. No acute bony findings. Degenerative disc disease, most pronounced at L4-5. IMPRESSION: 1. No acute abdominopelvic findings. 2. Moderate sigmoid diverticulosis without evidence of acute diverticulitis. 3. Small fat containing umbilical and supraumbilical hernias. 4. Aortic atherosclerosis (ICD10-I70.0). Electronically Signed   By: Davina Poke D.O.   On: 06/03/2021 16:26    Procedures Procedures   Medications Ordered in ED Medications  sodium chloride (PF) 0.9 % injection (has no administration in time range)  iohexol (OMNIPAQUE) 350 MG/ML injection 80 mL (80 mLs Intravenous Contrast Given 06/03/21 1601)    ED Course  I have reviewed the triage vital signs and the nursing notes.  Pertinent labs & imaging results that were available during my care of the patient were reviewed by me and considered in my medical decision making (see chart for details).    MDM  Rules/Calculators/A&P                           70 year old female with history of diabetes, CAD, PAD comes in with chief complaint of intermittent abdominal pain.  Currently pain-free.  History of pain on the left side over the last few days, that became more severe and constant today.  Ultrasound was completed as an outpatient, I reviewed the result and it revealed a left-sided ovarian cyst.  The ovarian cyst is small in size.  She is not having any vaginal bleeding, discharge and denies any UTI-like symptoms or bloody stools.  Her blood work is reassuring.  Patient is currently pain-free.  CT was ordered, it reveals no evidence of diverticulitis, SBO, AAA, kidney stones. Pain is worse with movement therefore I suspect some musculoskeletal element.  5:46 PM The patient appears reasonably screened and/or stabilized for discharge and I doubt any other medical condition or other [facility] requiring further screening, evaluation, or treatment in the ED at this time prior to discharge.   Results from the ER workup discussed with the patient face to face and all questions answered to the best of my ability. The patient is safe for discharge with strict return precautions.  Patient will take Tylenol finder milligrams every 6-8 hours for the next couple of days and add stool softeners.  Final Clinical Impression(s) / ED Diagnoses Final diagnoses:  Lower abdominal pain of unknown etiology  Musculoskeletal pain    Rx / DC Orders ED Discharge Orders     None        Varney Biles, MD 06/03/21 1746  Varney Biles, MD 06/03/21 [phone]

## 2021-06-03 NOTE — Discharge Instructions (Signed)
You were seen in the ER for abdominal pain. It seems like you might have musculoskeletal pain, since the pain is worse every time you move.  For now, I recommend that you take Tylenol 500 mg every 6-8 hours for the next 2 days and you may consider taking stool softeners if you are constipated.

## 2021-06-03 NOTE — ED Notes (Signed)
Pt NAD, a/ox4. Pt verbalizes understanding of all DC and f/u instructions. All questions answered. Pt walks with steady gait to lobby at DC.  ? ?

## 2021-06-03 NOTE — ED Triage Notes (Signed)
Pt reports left sided abdominal pain that has become more severe since last week. She was told that she had a ovarian cyst on Thursday of last week. Denies vaginal bleeding and N/V/D.

## 2021-06-04 ENCOUNTER — Other Ambulatory Visit: Payer: Medicare Other

## 2021-06-04 ENCOUNTER — Telehealth: Payer: Self-pay

## 2021-06-04 NOTE — Telephone Encounter (Signed)
Called pt lvm to give the office a call back. Pt visited ED on 06/03/2021.

## 2021-06-12 ENCOUNTER — Other Ambulatory Visit: Payer: Self-pay | Admitting: Nurse Practitioner

## 2021-06-12 DIAGNOSIS — U071 COVID-19: Secondary | ICD-10-CM | POA: Diagnosis not present

## 2021-06-12 DIAGNOSIS — R1032 Left lower quadrant pain: Secondary | ICD-10-CM

## 2021-06-12 DIAGNOSIS — N83202 Unspecified ovarian cyst, left side: Secondary | ICD-10-CM

## 2021-06-13 ENCOUNTER — Ambulatory Visit: Payer: Medicare Other | Admitting: Sports Medicine

## 2021-06-16 DIAGNOSIS — Z794 Long term (current) use of insulin: Secondary | ICD-10-CM | POA: Diagnosis not present

## 2021-06-16 DIAGNOSIS — E1165 Type 2 diabetes mellitus with hyperglycemia: Secondary | ICD-10-CM | POA: Diagnosis not present

## 2021-06-18 ENCOUNTER — Ambulatory Visit: Payer: Medicare Other | Admitting: Nurse Practitioner

## 2021-06-19 ENCOUNTER — Encounter: Payer: Self-pay | Admitting: Nurse Practitioner

## 2021-06-19 ENCOUNTER — Ambulatory Visit (INDEPENDENT_AMBULATORY_CARE_PROVIDER_SITE_OTHER): Payer: Medicare Other | Admitting: Nurse Practitioner

## 2021-06-19 ENCOUNTER — Other Ambulatory Visit: Payer: Self-pay

## 2021-06-19 VITALS — BP 122/72 | HR 74 | Temp 98.1°F | Ht 66.0 in | Wt 177.6 lb

## 2021-06-19 DIAGNOSIS — R1032 Left lower quadrant pain: Secondary | ICD-10-CM | POA: Diagnosis not present

## 2021-06-19 DIAGNOSIS — N281 Cyst of kidney, acquired: Secondary | ICD-10-CM

## 2021-06-19 NOTE — Progress Notes (Signed)
I sent a referral to GYN.

## 2021-06-19 NOTE — Progress Notes (Signed)
I,[name],acting as a Neurosurgeon for [employer], NP.,have documented all relevant documentation on the behalf of [employer], NP,as directed by  [name] Ivory, NP while in the presence of [name] Ivory, NP.  This visit occurred during the SARS-CoV-2 public health emergency.  Safety protocols were in place, including screening questions prior to the visit, additional usage of staff PPE, and extensive cleaning of exam room while observing appropriate contact time as indicated for disinfecting solutions.  Subjective:     Patient ID: [name] [name] , female    DOB: 01-Oct-1950 , 70 y.o.   MRN: [id number]   Chief Complaint  Patient presents with   Follow-up    HPI  Pt presents today for ED f/u. She was admitted on 06/03/2021 for abdominal pain. She does have a history left ovarian cyst.  Pt reports to taking Colace & Tylenol. She states it does help, has helped since discharged from ED. Pt only takes Tylenol as needed. She would like to discuss labs that were sent over. She has no other complaints.  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     Past Medical History:  Diagnosis Date   Anemia    Carpal tunnel syndrome    Coronary artery disease    DES to ostial RCA 2005   Diabetes mellitus without complication (HCC)    No longer taking meds   Dyslipidemia    Hypertension    Osteoarthritis    PAD (peripheral artery disease) (H[facility])    Sleep apnea      Family History  Problem Relation Age of Onset   CAD Father 60   Heart attack Father    CAD Mother 6   Heart attack Mother    CAD Brother    Cancer Brother        prostate   CAD Sister    Cancer Sister        breast   CAD Brother      Current Outpatient Medications:    Accu-Chek FastClix Lancets MISC, CHECK BLOOD SUGAR THREE TIMES DAILY dx: e11.65, Disp: 100 each, Rfl: 2   amLODipine (NORVASC) 10 MG tablet, TAKE 1 TABLET(10 MG) BY MOUTH  DAILY, Disp: 90 tablet, Rfl: 1   Ascorbic Acid (VITAMIN C) 500 MG CHEW, 1 tablet, Disp: , Rfl:    aspirin EC 81 MG tablet, Take 81 mg by mouth daily. Swallow whole., Disp: , Rfl:    Cholecalciferol ([employer]S VIT D 5000 HIGH-POTENCY PO), Take 1 tablet by mouth daily., Disp: , Rfl:    Coenzyme Q10 (COQ-10) 100 MG CAPS, Take 1 tablet by mouth daily. , Disp: , Rfl:    Continuous Blood Gluc Receiver (FREESTYLE LIBRE 2 READER) DEVI, Use as directed to check blood sugars 3 times per day dx: e11.65, Disp: 1 each, Rfl: 1   Cyanocobalamin (VITAMIN B-12 PO), Take 500 mcg by mouth., Disp: , Rfl:    FARXIGA 10 MG TABS tablet, TAKE 1 TABLET(10 MG) BY MOUTH DAILY BEFORE BREAKFAST, Disp: 30 tablet, Rfl: 2   glucosamine-chondroitin 500-400 MG tablet, Take 1 tablet by mouth daily. Reported on 12/17/2015, Disp: , Rfl:    glucose blood (ONETOUCH VERIO) test strip, CHECK BLOOD SUGAR THREE TIMES DAILY dx: e11.65, Disp: 150 each, Rfl: 11   insulin lispro (HUMALOG KWIKPEN) 100 UNIT/ML KwikPen, Inject subcutaneously 2 units if sugars are 150 - 199, 4 units 200 -249, 6 units 250- 299, 8 units 300 -  349, 10 units >350, Disp: 15 mL, Rfl: 0   Insulin Pen Needle (BD PEN NEEDLE NANO 2ND GEN) 32G X 4 MM MISC, USE TO INJECT INSULIN DAILY, Disp: 100 each, Rfl: 2   MAGNESIUM OXIDE PO, Take 400 mg by mouth daily., Disp: , Rfl:    meloxicam (MOBIC) 15 MG tablet, Take 15 mg by mouth daily as needed., Disp: , Rfl:    metoprolol tartrate (LOPRESSOR) 50 MG tablet, TAKE 1 TABLET BY MOUTH TWICE DAILY, Disp: 180 tablet, Rfl: 4   Misc Natural Products (NEURIVA) CAPS, Take 1 capsule by mouth daily., Disp: , Rfl:    nystatin (NYSTATIN) powder, Apply topically 3 (three) times daily. As needed, Disp: 60 g, Rfl: 0   omeprazole (PRILOSEC) 20 MG capsule, TAKE 1 CAPSULE BY MOUTH DAILY, Disp: 90 capsule, Rfl: 1   OneTouch Delica Lancets 33G MISC, USE AS DIRECTED TO CHECK BLOOD SUGAR THREE TIMES DAILY, Disp: 100 each, Rfl: 0   PFIZER-BIONT COVID-19 VAC-TRIS  SUSP injection, , Disp: , Rfl:    pregabalin (LYRICA) 50 MG capsule, Take 1 capsule (50 mg total) by mouth at bedtime., Disp: 90 capsule, Rfl: 1   Psyllium (METAMUCIL FIBER PO), Take by mouth., Disp: , Rfl:    rosuvastatin (CRESTOR) 20 MG tablet, TAKE 1 TABLET(20 MG) BY MOUTH DAILY, Disp: 90 tablet, Rfl: 1   traMADol (ULTRAM) 50 MG tablet, Take 1 tablet (50 mg total) by mouth every 6 (six) hours as needed., Disp: 20 tablet, Rfl: 0   traZODone (DESYREL) 50 MG tablet, TAKE 1 TABLET BY MOUTH EVERY NIGHT AT BEDTIME, Disp: 90 tablet, Rfl: 1   TRULICITY 1.5 MG/0.5ML SOPN, ADMINISTER 1.5 MG UNDER THE SKIN 1 TIME A WEEK, Disp: 6 mL, Rfl: 3   Vitamin A 3 MG (10000 UT) TABS, Take 1 tablet by mouth daily. 2,400 mcg, Disp: , Rfl:    Vitamin D, Ergocalciferol, (DRISDOL) 1.25 MG (50000 UNIT) CAPS capsule, TAKE 1 CAPSULE BY MOUTH EVERY 7 DAYS, Disp: 12 capsule, Rfl: 1   vitamin E 600 UNIT capsule, Take 400 Units by mouth daily., Disp: , Rfl:    Allergies  Allergen Reactions   Fish Allergy     Cod   Latex Rash     Review of Systems  Constitutional: Negative.  Negative for chills and fever.  HENT:  Negative for congestion and sinus pressure.   Respiratory: Negative.  Negative for cough, shortness of breath and wheezing.   Cardiovascular: Negative.  Negative for chest pain and palpitations.  Gastrointestinal:  Negative for abdominal pain.  Endocrine: Negative for polydipsia, polyphagia and polyuria.  Musculoskeletal:  Negative for arthralgias and myalgias.  Neurological: Negative.  Negative for dizziness, weakness, numbness and headaches.  Psychiatric/Behavioral: Negative.      Today's Vitals   06/19/21 1100  BP: 122/72  Pulse: 74  Temp: 98.1 F (36.7 C)  Weight: 177 lb 9.6 oz (80.6 kg)  Height: 5\' 6"  (1.676 m)   Body mass index is 28.67 kg/m.  Wt Readings from Last 3 Encounters:  06/19/21 177 lb 9.6 oz (80.6 kg)  06/03/21 179 lb (81.2 kg)  05/28/21 178 lb 3.2 oz (80.8 kg)    Objective:   Physical Exam Constitutional:      Appearance: Normal appearance. She is obese.  HENT:     Head: Normocephalic and atraumatic.  Cardiovascular:     Rate and Rhythm: Normal rate and regular rhythm.     Pulses: Normal pulses.     Heart sounds: Normal heart  sounds. No murmur heard. Pulmonary:     Effort: Pulmonary effort is normal. No respiratory distress.     Breath sounds: Normal breath sounds. No wheezing.  Skin:    General: Skin is warm and dry.     Capillary Refill: Capillary refill takes less than 2 seconds.  Neurological:     Mental Status: She is alert and oriented to person, place, and time.        Assessment And Plan:     1. Left lower quadrant abdominal pain -Reviewed labs with patient and CT.  -Patient does not have any more pain as she said the colace helped move her bowels.   2. Renal cyst -Recent CT showed 2.7 cm cyst at the upper pole of the left kidney. 1.1 cm right renal cyst.  - Ambulatory referral to Nephrology   The patient was encouraged to call or send a message through MyChart for any questions or concerns.   Follow up: if symptoms persist or do not get better.   Side effects and appropriate use of all the medication(s) were discussed with the patient today. Patient advised to use the medication(s) as directed by their healthcare provider. The patient was encouraged to read, review, and understand all associated package inserts and contact our office with any questions or concerns. The patient accepts the risks of the treatment plan and had an opportunity to ask questions.   Patient was given opportunity to ask questions. Patient verbalized understanding of the plan and was able to repeat key elements of the plan. All questions were answered to their satisfaction.  [name] Manville Rico, DNP   I, [name] Charne Mcbrien have reviewed all documentation for this visit. The documentation on 06/19/21 for the exam, diagnosis, procedures, and orders are all accurate and  complete.   diagnosis, procedures, and orders are all accurate and complete.   IF YOU HAVE BEEN REFERRED TO A SPECIALIST, IT MAY TAKE 1-2 WEEKS TO SCHEDULE/PROCESS THE REFERRAL. IF YOU HAVE NOT HEARD FROM US/SPECIALIST IN TWO WEEKS, PLEASE GIVE [place] A CALL AT [phone].   THE PATIENT IS ENCOURAGED TO PRACTICE SOCIAL DISTANCING DUE TO THE COVID-19 PANDEMIC.

## 2021-06-21 DIAGNOSIS — Z1152 Encounter for screening for COVID-19: Secondary | ICD-10-CM | POA: Diagnosis not present

## 2021-07-01 NOTE — Progress Notes (Signed)
Cardiology Office Note   Date:  07/03/2021   ID:  [name] [name], DOB [birth year], MRN [id number]  PCP:  [name], MD  Cardiologist:   Rollene Rotunda, MD   Chief Complaint  Patient presents with   Coronary Artery Disease      History of Present Illness: [name] [name] is a 71 y.o. female who presents follow up of CAD.   She had a history of a PCI in 2005.  She had a stress perfusion study in 2013 demonstrated no evidence of ischemia and well-preserved ejection fraction.  In 2018 she had a negative inadequate POET (Plain Old Exercise Treadmill).    In 2020 she was having some increased dyspnea.  She had an echo ordered by her PCP. She had normal LV function and no significant valvular abnormalities.     Since I last saw her she has been catering and has lost about 20 pounds over several months.  She is feeling well.  She has had no new shortness of breath and actually this has resolved.  She has had no chest pressure, neck or arm discomfort.  She has had no weight gain or edema.   Past Medical History:  Diagnosis Date   Anemia    Carpal tunnel syndrome    Coronary artery disease    DES to ostial RCA 2005   Diabetes mellitus without complication (HCC)    No longer taking meds   Dyslipidemia    Hypertension    Osteoarthritis    PAD (peripheral artery disease) (HCC)    Sleep apnea     Past Surgical History:  Procedure Laterality Date   APPENDECTOMY     BREAST EXCISIONAL BIOPSY Right >10+ yrs ago   benign   heart stent     TONSILLECTOMY     WRIST SURGERY       Current Outpatient Medications  Medication Sig Dispense Refill   Accu-Chek FastClix Lancets MISC CHECK BLOOD SUGAR THREE TIMES DAILY dx: e11.65 100 each 2   amLODipine (NORVASC) 10 MG tablet TAKE 1 TABLET(10 MG) BY MOUTH DAILY 90 tablet 1   Ascorbic Acid (VITAMIN C) 500 MG CHEW 1 tablet     aspirin EC 81 MG tablet Take 81 mg by mouth daily. Swallow whole.     Cholecalciferol ([employer]S VIT D 5000  HIGH-POTENCY PO) Take 1 tablet by mouth daily.     Coenzyme Q10 (COQ-10) 100 MG CAPS Take 1 tablet by mouth daily.      Continuous Blood Gluc Receiver (FREESTYLE LIBRE 2 READER) DEVI Use as directed to check blood sugars 3 times per day dx: e11.65 1 each 1   Cyanocobalamin (VITAMIN B-12 PO) Take 500 mcg by mouth.     FARXIGA 10 MG TABS tablet TAKE 1 TABLET(10 MG) BY MOUTH DAILY BEFORE BREAKFAST 30 tablet 2   glucosamine-chondroitin 500-400 MG tablet Take 1 tablet by mouth daily. Reported on 12/17/2015     glucose blood (ONETOUCH VERIO) test strip CHECK BLOOD SUGAR THREE TIMES DAILY dx: e11.65 150 each 11   insulin lispro (HUMALOG KWIKPEN) 100 UNIT/ML KwikPen Inject subcutaneously 2 units if sugars are 150 - 199, 4 units 200 -249, 6 units 250- 299, 8 units 300 - 349, 10 units >350 15 mL 0   Insulin Pen Needle (BD PEN NEEDLE NANO 2ND GEN) 32G X 4 MM MISC USE TO INJECT INSULIN DAILY 100 each 2   MAGNESIUM OXIDE PO Take 400 mg by mouth daily.     meloxicam (MOBIC)  15 MG tablet Take 15 mg by mouth daily as needed.     metoprolol tartrate (LOPRESSOR) 50 MG tablet TAKE 1 TABLET BY MOUTH TWICE DAILY 180 tablet 4   Misc Natural Products (NEURIVA) CAPS Take 1 capsule by mouth daily.     nystatin (NYSTATIN) powder Apply topically 3 (three) times daily. As needed 60 g 0   omeprazole (PRILOSEC) 20 MG capsule TAKE 1 CAPSULE BY MOUTH DAILY 90 capsule 1   OneTouch Delica Lancets 33G MISC USE AS DIRECTED TO CHECK BLOOD SUGAR THREE TIMES DAILY 100 each 0   PFIZER-BIONT COVID-19 VAC-TRIS SUSP injection      pregabalin (LYRICA) 50 MG capsule Take 1 capsule (50 mg total) by mouth at bedtime. 90 capsule 1   Psyllium (METAMUCIL FIBER PO) Take by mouth.     rosuvastatin (CRESTOR) 20 MG tablet TAKE 1 TABLET(20 MG) BY MOUTH DAILY 90 tablet 1   traMADol (ULTRAM) 50 MG tablet Take 1 tablet (50 mg total) by mouth every 6 (six) hours as needed. 20 tablet 0   traZODone (DESYREL) 50 MG tablet TAKE 1 TABLET BY MOUTH EVERY NIGHT  AT BEDTIME 90 tablet 1   TRULICITY 1.5 MG/0.5ML SOPN ADMINISTER 1.5 MG UNDER THE SKIN 1 TIME A WEEK 6 mL 3   Vitamin A 3 MG (10000 UT) TABS Take 1 tablet by mouth daily. 2,400 mcg     Vitamin D, Ergocalciferol, (DRISDOL) 1.25 MG (50000 UNIT) CAPS capsule TAKE 1 CAPSULE BY MOUTH EVERY 7 DAYS 12 capsule 1   vitamin E 600 UNIT capsule Take 400 Units by mouth daily.     No current facility-administered medications for this visit.    Allergies:   Fish allergy and Latex    ROS:  Please see the history of present illness.   Otherwise, review of systems are positive for none.   All other systems are reviewed and occasional right shooting groin pain.    PHYSICAL EXAM: VS:  BP 120/80 (BP Location: Left Arm, Patient Position: Sitting)    Pulse 75    Ht 5\' 6"  (1.676 m)    Wt 176 lb (79.8 kg)    SpO2 95%    BMI 28.41 kg/m  , BMI Body mass index is 28.41 kg/m. GENERAL:  Well appearing NECK:  No jugular venous distention, waveform within normal limits, carotid upstroke brisk and symmetric, no bruits, no thyromegaly LUNGS:  Clear to auscultation bilaterally CHEST:  Unremarkable HEART:  PMI not displaced or sustained,S1 and S2 within normal limits, no S3, no S4, no clicks, no rubs, no murmurs ABD:  Flat, positive bowel sounds normal in frequency in pitch, no bruits, no rebound, no guarding, no midline pulsatile mass, no hepatomegaly, no splenomegaly EXT:  2 plus pulses throughout, no edema, no cyanosis no clubbing   EKG:  EKG is not ordered today. The ekg ordered 04/10/2021 demonstrates sinus rhythm, rate 72 premature ectopic complexes, low voltage on the limb leads, poor anterior R wave progression, nonspecific T wave flattening.   Recent Labs: 04/10/2021: TSH 1.160 06/03/2021: ALT 14; BUN 9; Creatinine, Ser 0.77; Hemoglobin 12.6; Platelets 284; Potassium 4.4; Sodium 139    Lipid Panel    Component Value Date/Time   CHOL 154 04/10/2021 1537   TRIG 174 (H) 04/10/2021 1537   HDL 53 04/10/2021  1537   CHOLHDL 2.9 04/10/2021 1537   CHOLHDL 3.0 Ratio 09/16/2010 2137   VLDL 39 09/16/2010 2137   LDLCALC 72 04/10/2021 1537      Wt Readings  from Last 3 Encounters:  07/03/21 176 lb (79.8 kg)  06/19/21 177 lb 9.6 oz (80.6 kg)  06/03/21 179 lb (81.2 kg)      Other studies Reviewed: Additional studies/ records that were reviewed today include:  Labs Review of the above records demonstrates:  Please see elsewhere in the note.     ASSESSMENT AND PLAN:  CAD:   The patient has no new sypmtoms.  No further cardiovascular testing is indicated.  We will continue with aggressive risk reduction and meds as listed.   DYSLIPIDEMIA:   LDL was 72.  No change in therapy.   DM:  Her A1c was coming down and was 7.3 and had been 7.9 but is being followed closely by Dr. Allyne GeeSanders, Melina Schoolsobyn, MD   HTN: Her blood pressure is at target.  No change in therapy.   SLEEP APNEA: He does have CPAP but does not wear it.   Current medicines are reviewed at length with the patient today.  The patient does not have concerns regarding medicines.  The following changes have been made:  None  Labs/ tests ordered today include: None  No orders of the defined types were placed in this encounter.    Disposition:   FU with me in one year.     Signed, Rollene RotundaJames Slate Debroux, MD  07/03/2021 9:44 AM    [facility]

## 2021-07-02 ENCOUNTER — Other Ambulatory Visit: Payer: Self-pay | Admitting: Nurse Practitioner

## 2021-07-02 DIAGNOSIS — N281 Cyst of kidney, acquired: Secondary | ICD-10-CM

## 2021-07-02 DIAGNOSIS — R1032 Left lower quadrant pain: Secondary | ICD-10-CM

## 2021-07-03 ENCOUNTER — Other Ambulatory Visit: Payer: Self-pay

## 2021-07-03 ENCOUNTER — Ambulatory Visit (INDEPENDENT_AMBULATORY_CARE_PROVIDER_SITE_OTHER): Payer: Commercial Managed Care - HMO | Admitting: Cardiology

## 2021-07-03 ENCOUNTER — Encounter: Payer: Self-pay | Admitting: Cardiology

## 2021-07-03 VITALS — BP 120/80 | HR 75 | Ht 66.0 in | Wt 176.0 lb

## 2021-07-03 DIAGNOSIS — I10 Essential (primary) hypertension: Secondary | ICD-10-CM

## 2021-07-03 DIAGNOSIS — E785 Hyperlipidemia, unspecified: Secondary | ICD-10-CM | POA: Diagnosis not present

## 2021-07-03 DIAGNOSIS — I25119 Atherosclerotic heart disease of native coronary artery with unspecified angina pectoris: Secondary | ICD-10-CM | POA: Diagnosis not present

## 2021-07-03 DIAGNOSIS — R0602 Shortness of breath: Secondary | ICD-10-CM | POA: Diagnosis not present

## 2021-07-03 DIAGNOSIS — E118 Type 2 diabetes mellitus with unspecified complications: Secondary | ICD-10-CM

## 2021-07-03 NOTE — Patient Instructions (Signed)
Medication Instructions:  NO CHANGES  *If you need a refill on your cardiac medications before your next appointment, please call your pharmacy*   Follow-Up: At [facility], you and your health needs are our priority.  As part of our continuing mission to provide you with exceptional heart care, we have created designated Provider Care Teams.  These Care Teams include your primary Cardiologist (physician) and Advanced Practice Providers (APPs -  Physician Assistants and Nurse Practitioners) who all work together to provide you with the care you need, when you need it.  We recommend signing up for the patient portal called "MyChart".  Sign up information is provided on this After Visit Summary.  MyChart is used to connect with patients for Virtual Visits (Telemedicine).  Patients are able to view lab/test results, encounter notes, upcoming appointments, etc.  Non-urgent messages can be sent to your provider as well.   To learn more about what you can do with MyChart, go to [email or web address].    Your next appointment:   12 month(s)  The format for your next appointment:   In Person  Provider:   Minus Breeding, MD {

## 2021-07-04 ENCOUNTER — Telehealth (INDEPENDENT_AMBULATORY_CARE_PROVIDER_SITE_OTHER): Payer: Medicare Other | Admitting: Nurse Practitioner

## 2021-07-04 ENCOUNTER — Encounter: Payer: Self-pay | Admitting: Nurse Practitioner

## 2021-07-04 VITALS — BP 131/75 | HR 70 | Temp 98.4°F | Ht 66.0 in | Wt 175.9 lb

## 2021-07-04 DIAGNOSIS — E1122 Type 2 diabetes mellitus with diabetic chronic kidney disease: Secondary | ICD-10-CM

## 2021-07-04 DIAGNOSIS — R509 Fever, unspecified: Secondary | ICD-10-CM | POA: Diagnosis not present

## 2021-07-04 DIAGNOSIS — R52 Pain, unspecified: Secondary | ICD-10-CM

## 2021-07-04 DIAGNOSIS — R6883 Chills (without fever): Secondary | ICD-10-CM

## 2021-07-04 DIAGNOSIS — N182 Chronic kidney disease, stage 2 (mild): Secondary | ICD-10-CM

## 2021-07-04 DIAGNOSIS — Z794 Long term (current) use of insulin: Secondary | ICD-10-CM

## 2021-07-04 MED ORDER — AZITHROMYCIN 250 MG PO TABS
ORAL_TABLET | ORAL | 0 refills | Status: DC
Start: 2021-07-04 — End: 2022-12-02

## 2021-07-04 NOTE — Progress Notes (Signed)
Virtual Visit via telephone   This visit type was conducted due to national recommendations for restrictions regarding the COVID-19 Pandemic (e.g. social distancing) in an effort to limit this patient's exposure and mitigate transmission in our community.  Due to her co-morbid illnesses, this patient is at least at moderate risk for complications without adequate follow up.  This format is felt to be most appropriate for this patient at this time.  All issues noted in this document were discussed and addressed.  A limited physical exam was performed with this format.    This visit type was conducted due to national recommendations for restrictions regarding the COVID-19 Pandemic (e.g. social distancing) in an effort to limit this patient's exposure and mitigate transmission in our community.  Patients identity confirmed using two different identifiers.  This format is felt to be most appropriate for this patient at this time.  All issues noted in this document were discussed and addressed.  No physical exam was performed (except for noted visual exam findings with Video Visits).    Date:  07/04/2021   ID:  Oris DroneLinda Siegel, DOB 1950-08-02, MRN [id number]  Patient Location:  Home  - spoke with Oris DroneLinda Kolinski  Provider location:   Office    Chief Complaint:  cold symptoms  History of Present Illness:    Oris DroneLinda Arenas is a 71 y.o. female who presents via telephone conferencing for a telehealth visit today.    The patient does have symptoms concerning for COVID-19 infection (fever, chills, cough, or new shortness of breath).   Telephone visit due to having runny nose, chills, body aches, congestion, headache and cough which has improved. Temp this morning was 97.8. her temp increased to 102, she took some coricidan. She had a PCR covid test done at home the results should be back tomorrow. She does have an appetite, she is able to taste.  Denies chest pain or shortness of breath. Denies  diarrhea.  She has been vaccinated for Covid with boosters. She did go to Bible study class in person this weekend.  Blood sugar was 103 this morning.     Past Medical History:  Diagnosis Date   Anemia    Carpal tunnel syndrome    Coronary artery disease    DES to ostial RCA 2005   Diabetes mellitus without complication (HCC)    No longer taking meds   Dyslipidemia    Hypertension    Osteoarthritis    PAD (peripheral artery disease) (HCC)    Sleep apnea    Past Surgical History:  Procedure Laterality Date   APPENDECTOMY     BREAST EXCISIONAL BIOPSY Right >10+ yrs ago   benign   heart stent     TONSILLECTOMY     WRIST SURGERY       Current Meds  Medication Sig   Accu-Chek FastClix Lancets MISC CHECK BLOOD SUGAR THREE TIMES DAILY dx: e11.65   amLODipine (NORVASC) 10 MG tablet TAKE 1 TABLET(10 MG) BY MOUTH DAILY   Ascorbic Acid (VITAMIN C) 500 MG CHEW 1 tablet   aspirin EC 81 MG tablet Take 81 mg by mouth daily. Swallow whole.   azithromycin (ZITHROMAX) 250 MG tablet Take 2 tablets (500 mg) on  Day 1,  followed by 1 tablet (250 mg) once daily on Days 2 through 5.   Cholecalciferol (CVS VIT D 5000 HIGH-POTENCY PO) Take 1 tablet by mouth daily.   Coenzyme Q10 (COQ-10) 100 MG CAPS Take 1 tablet by mouth daily.  Continuous Blood Gluc Receiver (FREESTYLE LIBRE 2 READER) DEVI Use as directed to check blood sugars 3 times per day dx: e11.65   Cyanocobalamin (VITAMIN B-12 PO) Take 500 mcg by mouth.   FARXIGA 10 MG TABS tablet TAKE 1 TABLET(10 MG) BY MOUTH DAILY BEFORE BREAKFAST   glucosamine-chondroitin 500-400 MG tablet Take 1 tablet by mouth daily. Reported on 12/17/2015   glucose blood (ONETOUCH VERIO) test strip CHECK BLOOD SUGAR THREE TIMES DAILY dx: e11.65   insulin lispro (HUMALOG KWIKPEN) 100 UNIT/ML KwikPen Inject subcutaneously 2 units if sugars are 150 - 199, 4 units 200 -249, 6 units 250- 299, 8 units 300 - 349, 10 units >350   Insulin Pen Needle (BD PEN NEEDLE NANO 2ND  GEN) 32G X 4 MM MISC USE TO INJECT INSULIN DAILY   MAGNESIUM OXIDE PO Take 400 mg by mouth daily.   meloxicam (MOBIC) 15 MG tablet Take 15 mg by mouth daily as needed.   metoprolol tartrate (LOPRESSOR) 50 MG tablet TAKE 1 TABLET BY MOUTH TWICE DAILY   Misc Natural Products (NEURIVA) CAPS Take 1 capsule by mouth daily.   nystatin (NYSTATIN) powder Apply topically 3 (three) times daily. As needed   omeprazole (PRILOSEC) 20 MG capsule TAKE 1 CAPSULE BY MOUTH DAILY   OneTouch Delica Lancets 33G MISC USE AS DIRECTED TO CHECK BLOOD SUGAR THREE TIMES DAILY   PFIZER-BIONT COVID-19 VAC-TRIS SUSP injection    pregabalin (LYRICA) 50 MG capsule Take 1 capsule (50 mg total) by mouth at bedtime.   Psyllium (METAMUCIL FIBER PO) Take by mouth.   rosuvastatin (CRESTOR) 20 MG tablet TAKE 1 TABLET(20 MG) BY MOUTH DAILY   traMADol (ULTRAM) 50 MG tablet Take 1 tablet (50 mg total) by mouth every 6 (six) hours as needed.   traZODone (DESYREL) 50 MG tablet TAKE 1 TABLET BY MOUTH EVERY NIGHT AT BEDTIME   TRULICITY 1.5 MG/0.5ML SOPN ADMINISTER 1.5 MG UNDER THE SKIN 1 TIME A WEEK   Vitamin A 3 MG (10000 UT) TABS Take 1 tablet by mouth daily. 2,400 mcg   Vitamin D, Ergocalciferol, (DRISDOL) 1.25 MG (50000 UNIT) CAPS capsule TAKE 1 CAPSULE BY MOUTH EVERY 7 DAYS   vitamin E 600 UNIT capsule Take 400 Units by mouth daily.     Allergies:   Fish allergy and Latex   Social History   Tobacco Use   Smoking status: Former    Packs/day: 0.50    Years: 30.00    Pack years: 15.00    Types: Cigarettes    Quit date: 12/20/2013    Years since quitting: 7.5   Smokeless tobacco: Never  Vaping Use   Vaping Use: Never used  Substance Use Topics   Alcohol use: Yes    Comment: rarely   Drug use: Not Currently    Comment: quit 2013     Family Hx: The patient's family history includes CAD in her brother, brother, and sister; CAD (age of onset: 17) in her mother; CAD (age of onset: 56) in her father; Cancer in her brother  and sister; Heart attack in her father and mother.  ROS:   Please see the history of present illness.    Review of Systems  Constitutional: Negative.  Negative for fever and malaise/fatigue.  HENT:  Positive for congestion.   Respiratory:  Positive for cough and sputum production (yellow). Negative for shortness of breath and wheezing.   Cardiovascular: Negative.  Negative for palpitations.  Neurological:  Negative for dizziness and headaches.  Psychiatric/Behavioral: Negative.  All other systems reviewed and are negative.   Labs/Other Tests and Data Reviewed:    Recent Labs: 04/10/2021: TSH 1.160 06/03/2021: ALT 14; BUN 9; Creatinine, Ser 0.77; Hemoglobin 12.6; Platelets 284; Potassium 4.4; Sodium 139   Recent Lipid Panel Lab Results  Component Value Date/Time   CHOL 154 04/10/2021 03:37 PM   TRIG 174 (H) 04/10/2021 03:37 PM   HDL 53 04/10/2021 03:37 PM   CHOLHDL 2.9 04/10/2021 03:37 PM   CHOLHDL 3.0 Ratio 09/16/2010 09:37 PM   LDLCALC 72 04/10/2021 03:37 PM    Wt Readings from Last 3 Encounters:  07/04/21 175 lb 14.8 oz (79.8 kg)  07/03/21 176 lb (79.8 kg)  06/19/21 177 lb 9.6 oz (80.6 kg)     Exam:    Vital Signs:  BP 131/75    Pulse 70    Temp 98.4 F (36.9 C)    Ht 5\' 6"  (1.676 m)    Wt 175 lb 14.8 oz (79.8 kg)    BMI 28.40 kg/m     Physical Exam Constitutional:      General: She is not in acute distress. Pulmonary:     Effort: Pulmonary effort is normal. No respiratory distress.     Comments: Able to speak in complete sentences Neurological:     Mental Status: She is alert and oriented to person, place, and time. Mental status is at baseline.  Psychiatric:        Mood and Affect: Mood and affect normal.        Behavior: Behavior normal.        Thought Content: Thought content normal.        Cognition and Memory: Memory normal.        Judgment: Judgment normal.    ASSESSMENT & PLAN:    1. Fever, unspecified fever cause Due to having an elevated  temperature will treat with antibioitcs - azithromycin (ZITHROMAX) 250 MG tablet; Take 2 tablets (500 mg) on  Day 1,  followed by 1 tablet (250 mg) once daily on Days 2 through 5.  Dispense: 6 each; Refill: 0  2. Chills She can take tylenol as needed for fever and body aches - azithromycin (ZITHROMAX) 250 MG tablet; Take 2 tablets (500 mg) on  Day 1,  followed by 1 tablet (250 mg) once daily on Days 2 through 5.  Dispense: 6 each; Refill: 0  3. Body aches  - azithromycin (ZITHROMAX) 250 MG tablet; Take 2 tablets (500 mg) on  Day 1,  followed by 1 tablet (250 mg) once daily on Days 2 through 5.  Dispense: 6 each; Refill: 0  Advised patient to take Vitamin C, D, Zinc.  Keep yourself hydrated with a lot of water and rest. Take Delsym for cough and Mucinex as need. Take Tylenol or pain reliever every 4-6 hours as needed for pain/fever/body ache. If you have elevated blood pressure, you can take OTC Corcidin. You can also take OTC oscillococcinum to help with your symptoms.  Educated patient if symptoms get worse or if she experiences any SOB, chest pain or pain in her legs to seek immediate emergency care. Call with her results of her covid test if she has not heard from us.    COVID-19 Education: The signs and symptoms of COVID-19 were discussed with the patient and how to seek care for testing (follow up with PCP or arrange E-visit).  The importance of social distancing was discussed today.  Patient Risk:   After full review of this  patients clinical status, I feel that they are at least moderate risk at this time.  Time:   Today, I have spent 10 minutes/ seconds with the patient with telehealth technology discussing above diagnoses.     Medication Adjustments/Labs and Tests Ordered: Current medicines are reviewed at length with the patient today.  Concerns regarding medicines are outlined above.   Tests Ordered: No orders of the defined types were placed in this encounter.   Medication  Changes: Meds ordered this encounter  Medications   azithromycin (ZITHROMAX) 250 MG tablet    Sig: Take 2 tablets (500 mg) on  Day 1,  followed by 1 tablet (250 mg) once daily on Days 2 through 5.    Dispense:  6 each    Refill:  0    Disposition:  Follow up prn  Signed, Arnette Felts, FNP

## 2021-07-07 DIAGNOSIS — U071 COVID-19: Secondary | ICD-10-CM | POA: Diagnosis not present

## 2021-07-09 ENCOUNTER — Ambulatory Visit (INDEPENDENT_AMBULATORY_CARE_PROVIDER_SITE_OTHER): Payer: Medicare Other

## 2021-07-09 DIAGNOSIS — N182 Chronic kidney disease, stage 2 (mild): Secondary | ICD-10-CM

## 2021-07-09 DIAGNOSIS — E1122 Type 2 diabetes mellitus with diabetic chronic kidney disease: Secondary | ICD-10-CM

## 2021-07-09 DIAGNOSIS — I739 Peripheral vascular disease, unspecified: Secondary | ICD-10-CM

## 2021-07-09 NOTE — Progress Notes (Signed)
Chronic Care Management Pharmacy Note  07/19/2021 Name:  [name] [name] MRN:  [id number] DOB:  June 12, 1951  Summary: Patient reports that she is feeling better.   Recommendations/Changes made from today's visit: Recommend patient continue current medication regimen. Recommended patients Rosuvastatin increased to 20 mg tablet.   Plan: Will document patients COVID-19 booster vaccine, and Pneumonia vaccine based on what is in the [facility] registry.    Subjective: [name] [name] is an 71 y.o. year old female who is a primary patient of [name] Chard, MD.  The CCM team was consulted for assistance with disease management and care coordination needs.    Engaged with patient by telephone for follow up visit in response to provider referral for pharmacy case management and/or care coordination services. Patient reports that her niece took a COVID test for her and she came back positive. Patient reports that she is feeling much better. 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   Consent to Services:  The patient was given information about Chronic Care Management services, agreed to services, and gave verbal consent prior to initiation of services.  Please see initial visit note for detailed documentation.   Patient Care Team: [name] Chard, MD as PCP - General (Internal Medicine) Minus Breeding, MD as PCP - Cardiology (Cardiology)  Recent office visits: 06/19/2021 PCP OV 16/07/2020 PCP OV   Recent consult visits: 07/09/2021 Cardiology [facility] visits: None in previous 6 months   Objective:  Lab Results  Component Value Date   CREATININE 0.77 06/03/2021   BUN 9 06/03/2021   GFRNONAA >60 06/03/2021   GFRAA 87 07/04/2020   NA 139 06/03/2021   K 4.4 06/03/2021   CALCIUM 9.0 06/03/2021   CO2 29 06/03/2021   GLUCOSE 100 (H) 06/03/2021    Lab Results  Component Value Date/Time   HGBA1C 7.3 (H) 04/10/2021 03:37 PM    HGBA1C 7.3 (H) 01/08/2021 10:55 AM   MICROALBUR 30 04/10/2021 05:02 PM   MICROALBUR 30 03/21/2021 10:30 AM    Last diabetic Eye exam:  Lab Results  Component Value Date/Time   HMDIABEYEEXA No Retinopathy 11/14/2020 12:00 AM    Last diabetic Foot exam: No results found for: HMDIABFOOTEX   Lab Results  Component Value Date   CHOL 154 04/10/2021   HDL 53 04/10/2021   LDLCALC 72 04/10/2021   TRIG 174 (H) 04/10/2021   CHOLHDL 2.9 04/10/2021    Hepatic Function Latest Ref Rng & Units 06/03/2021 04/10/2021 09/27/2020  Total Protein 6.5 - 8.1 g/dL 7.9 7.2 7.4  Albumin 3.5 - 5.0 g/dL 4.4 4.3 4.5  AST 15 - 41 U/L _0 ALT 0 - 44 U/L _1 Alk Phosphatase 38 - 126 U/L 111 135(H) 169(H)  Total Bilirubin 0.3 - 1.2 mg/dL 0.8 0.3 0.3    Lab Results  Component Value Date/Time   TSH 1.160 04/10/2021 03:37 PM   TSH 0.467 09/19/2014 04:00 PM   FREET4 1.30 05/04/2009 06:20 AM    CBC Latest Ref Rng & Units 06/03/2021 04/10/2021 05/07/2020  WBC 4.0 - 10.5 K/uL 5.3 5.8 6.3  Hemoglobin 12.0 - 15.0 g/dL 12.6 12.3 12.3  Hematocrit 36.0 - 46.0 % 41.2 39.1 39.0  Platelets 150 - 400 K/uL 284 270 268    Lab Results  Component Value Date/Time   VD25OH 32.5 03/23/2019 10:48 AM   VD25OH 28.8 03/17/2018 12:00 AM    Clinical ASCVD: Yes  The  10-year ASCVD risk score (Arnett DK, et al., 2019) is: 39.2%   Values used to calculate the score:     Age: 71 years     Sex: Female     Is Non-Hispanic African American: Yes     Diabetic: Yes     Tobacco smoker: Yes     Systolic Blood Pressure: 527 mmHg     Is BP treated: Yes     HDL Cholesterol: 53 mg/dL     Total Cholesterol: 154 mg/dL    Depression screen [facility] 2/9 03/21/2021 01/08/2021 09/27/2020  Decreased Interest 0 0 0  Down, Depressed, Hopeless 0 0 0  PHQ - 2 Score 0 0 0  Altered sleeping - - -  Tired, decreased energy - - -  Change in appetite - - -  Feeling bad or failure about yourself  - - -  Trouble concentrating - - -  Moving  slowly or fidgety/restless - - -  Suicidal thoughts - - -  PHQ-9 Score - - -  Difficult doing work/chores - - -  Some recent data might be hidden     Social History   Tobacco Use  Smoking Status Former   Packs/day: 0.50   Years: 30.00   Pack years: 15.00   Types: Cigarettes   Quit date: 12/20/2013   Years since quitting: 7.5  Smokeless Tobacco Never   BP Readings from Last 3 Encounters:  07/04/21 131/75  07/03/21 120/80  06/19/21 122/72   Pulse Readings from Last 3 Encounters:  07/04/21 70  07/03/21 75  06/19/21 74   Wt Readings from Last 3 Encounters:  07/04/21 175 lb 14.8 oz (79.8 kg)  07/03/21 176 lb (79.8 kg)  06/19/21 177 lb 9.6 oz (80.6 kg)   BMI Readings from Last 3 Encounters:  07/04/21 28.40 kg/m  07/03/21 28.41 kg/m  06/19/21 28.67 kg/m    Assessment/Interventions: Review of patient past medical history, allergies, medications, health status, including review of consultants reports, laboratory and other test data, was performed as part of comprehensive evaluation and provision of chronic care management services.   SDOH:  (Social Determinants of Health) assessments and interventions performed: Yes  SDOH Screenings   Alcohol Screen: Not on file  Depression (PHQ2-9): Low Risk    PHQ-2 Score: 0  Financial Resource Strain: Low Risk    Difficulty of Paying Living Expenses: Not hard at all  Food Insecurity: No Food Insecurity   Worried About Charity fundraiser in the Last Year: Never true   Ran Out of Food in the Last Year: Never true  Housing: Not on file  Physical Activity: Inactive   Days of Exercise per Week: 0 days   Minutes of Exercise per Session: 0 min  Social Connections: Not on file  Stress: No Stress Concern Present   Feeling of Stress : Only a little  Tobacco Use: Medium Risk   Smoking Tobacco Use: Former   Smokeless Tobacco Use: Never   Passive Exposure: Not on file  Transportation Needs: No Transportation Needs   Lack of  Transportation (Medical): No   Lack of Transportation (Non-Medical): No    CCM Care Plan  Allergies  Allergen Reactions   Fish Allergy     Cod   Latex Rash    Medications Reviewed Today     Reviewed by Mayford Knife, RPH (Pharmacist) on 07/09/21 at 1251  Med List Status: <None>   Medication Order Taking? Sig Documenting Provider Last Dose Status Informant  Accu-Chek FastClix  Lancets MISC [id number] No CHECK BLOOD SUGAR THREE TIMES DAILY dx: e11.65 [name] Chard, MD Taking Active   amLODipine (NORVASC) 10 MG tablet [id number] No TAKE 1 TABLET(10 MG) BY MOUTH DAILY [name] Chard, MD Taking Active   Ascorbic Acid (VITAMIN C) 500 MG CHEW [id number] No 1 tablet [provider] Taking Active   aspirin EC 81 MG tablet [id number] No Take 81 mg by mouth daily. Swallow whole. [provider] Taking Active   azithromycin (ZITHROMAX) 250 MG tablet [id number]  Take 2 tablets (500 mg) on  Day 1,  followed by 1 tablet (250 mg) once daily on Days 2 through 5. Minette Brine, FNP  Active   Cholecalciferol ([employer] VIT D 5000 HIGH-POTENCY PO) [id number] No Take 1 tablet by mouth daily. [provider] Taking Active   Coenzyme Q10 (COQ-10) 100 MG CAPS [id number] No Take 1 tablet by mouth daily.  [provider] Taking Active   Continuous Blood Gluc Receiver (FREESTYLE LIBRE 2 READER) DEVI [id number] No Use as directed to check blood sugars 3 times per day dx: e11.65 [name] Chard, MD Taking Active   Cyanocobalamin (VITAMIN B-12 PO) [id number] No Take 500 mcg by mouth. [provider] Taking Active   FARXIGA 10 MG TABS tablet [id number] No TAKE 1 TABLET(10 MG) BY MOUTH DAILY BEFORE Adella Nissen, MD Taking Active   glucosamine-chondroitin 500-400 MG tablet [id number] No Take 1 tablet by mouth daily. Reported on 12/17/2015 [provider] Taking Active   glucose blood (ONETOUCH VERIO) test strip [id number] No CHECK BLOOD SUGAR THREE TIMES DAILY dx: e11.65  [name] Chard, MD Taking Active   insulin lispro (HUMALOG KWIKPEN) 100 UNIT/ML KwikPen [id number] No Inject subcutaneously 2 units if sugars are 150 - 199, 4 units 200 -249, 6 units 250- 299, 8 units 300 - 349, 10 units >350 [name] Chard, MD Taking Active   Insulin Pen Needle (BD PEN NEEDLE NANO 2ND GEN) 32G X 4 MM MISC [id number] No USE TO INJECT INSULIN DAILY [name] Chard, MD Taking Active   MAGNESIUM OXIDE PO [id number] No Take 400 mg by mouth daily. [provider] Taking Active Self  meloxicam (MOBIC) 15 MG tablet [id number] No Take 15 mg by mouth daily as needed. [provider] Taking Active   metoprolol tartrate (LOPRESSOR) 50 MG tablet [id number] No TAKE 1 TABLET BY MOUTH TWICE DAILY [name] Chard, MD Taking Active   Misc Natural Products (NEURIVA) CAPS [id number] No Take 1 capsule by mouth daily. [provider] Taking Active Self  nystatin (NYSTATIN) powder [id number] No Apply topically 3 (three) times daily. As needed [name] Chard, MD Taking Active   omeprazole (PRILOSEC) 20 MG capsule [id number] No TAKE 1 CAPSULE BY MOUTH DAILY [name] Chard, MD Taking Active   OneTouch Delica Lancets 00B [place] [id number] No USE AS DIRECTED TO CHECK BLOOD SUGAR THREE TIMES DAILY [name] Chard, MD Taking Active   PFIZER-BIONT COVID-19 VAC-TRIS SUSP injection [id number] No  [provider] Taking Active   pregabalin (LYRICA) 50 MG capsule [id number] No Take 1 capsule (50 mg total) by mouth at bedtime. [name] Chard, MD Taking Active   Psyllium [facility] FIBER PO) [id number] No Take by mouth. [provider] Taking Active   rosuvastatin (CRESTOR) 20 MG tablet [id number] No TAKE 1 TABLET(20 MG) BY MOUTH DAILY Minus Breeding, MD Taking Active   traMADol (ULTRAM) 50 MG tablet [id number] No Take 1 tablet (50 mg total) by mouth every 6 (six) hours as needed. Minette Brine, FNP Taking Active   traZODone (  DESYREL) 50 MG tablet [id number] No TAKE 1 TABLET BY MOUTH EVERY  NIGHT AT BEDTIME [name] Chard, MD Taking Active   TRULICITY 1.5 OM/6.0OK Bonney Aid [id number] No ADMINISTER 1.5 MG UNDER THE SKIN 1 TIME A WEEK [name] Chard, MD Taking Active   Vitamin A 3 MG (10000 UT) TABS [id number] No Take 1 tablet by mouth daily. 2,400 mcg [provider] Taking Active Self  Vitamin D, Ergocalciferol, (DRISDOL) 1.25 MG (50000 UNIT) CAPS capsule [id number] No TAKE 1 CAPSULE BY MOUTH EVERY 7 DAYS [name] Chard, MD Taking Active   vitamin E 600 UNIT capsule [id number] No Take 400 Units by mouth daily. [provider] Taking Active             Patient Active Problem List   Diagnosis Date Noted   Parenchymal renal hypertension 07/04/2020   Educated about COVID-19 virus infection 03/25/2020   OSA and COPD overlap syndrome ([place]) 03/14/2019   Snoring 02/17/2019   Peripheral arterial disease ([place]) 01/26/2019   Hepatitis C carrier ([place]) 01/26/2019   Hypertensive heart and renal disease 08/19/2018   Class 1 obesity due to excess calories with serious comorbidity and body mass index (BMI) of 33.0 to 33.9 in adult 08/19/2018   Paresthesia of left upper extremity 06/29/2018   Atherosclerosis of native coronary artery of native heart with angina pectoris ([place]) 04/09/2018   Dyslipidemia 04/09/2018   Primary osteoarthritis of both feet 11/05/2017   Primary osteoarthritis of both hands 10/30/2017   Primary osteoarthritis of both knees 10/30/2017   Primary insomnia 10/30/2017   Family history of lupus erythematosus 10/30/2017   Dyspnea on exertion 12/10/2016   Type 2 diabetes mellitus with stage 2 chronic kidney disease, with long-term current use of insulin ([place]) 09/21/2014   Acute cystitis 09/21/2014   Hyperglycemia 09/19/2014   Bradycardia 09/19/2014   Hyponatremia 09/19/2014   Elevated alkaline phosphatase level 09/19/2014   Acute kidney injury ([place])    Essential hypertension, benign 07/05/2008   Coronary atherosclerosis 07/05/2008   TOBACCO USER  07/03/2008    Immunization History  Administered Date(s) Administered   Fluad Quad(high Dose 65+) 03/04/2019   Influenza, High Dose Seasonal PF 03/21/2017, 03/11/2018   Influenza-Unspecified 03/07/2020, 03/13/2021   PFIZER Comirnaty(Gray Top)Covid-19 Tri-Sucrose Vaccine 11/23/2020   PFIZER(Purple Top)SARS-COV-2 Vaccination 07/30/2019, 08/20/2019, 04/16/2020   Pneumococcal Conjugate-13 10/23/2016   Zoster Recombinat (Shingrix) 01/25/2019, 03/30/2019    Conditions to be addressed/monitored:  Hypertension and Hyperlipidemia  Care Plan : [place]  Updates made by Mayford Knife, [place] since 07/19/2021 12:00 AM     Problem: PAD, DM II   Priority: High     Long-Range Goal: Disease Management   Note:   Current Barriers:  Unable to independently monitor therapeutic efficacy Unable to achieve control of diabetes   Pharmacist Clinical Goal(s):  Patient will achieve adherence to monitoring guidelines and medication adherence to achieve therapeutic efficacy through collaboration with PharmD and provider.   Interventions: 1:1 collaboration with [name] Chard, MD regarding development and update of comprehensive plan of care as evidenced by provider attestation and co-signature Inter-disciplinary care team collaboration (see longitudinal plan of care) Comprehensive medication review performed; medication list updated in electronic medical record  Peripheral Arterial Disease: (LDL goal < 70) -Not ideally controlled -Current treatment: Rosuvastatin 20 mg tablet once per day Appropriate, Effective, Safe, Accessible Metoprolol tartrate 50 mg tablet twice per day Appropriate, Effective, Safe, Accessible Amlodipine 10 mg tablet once per day Appropriate, Effective, Safe, Accessible Aspirin 81 mg tablet once per  day  Appropriate, Effective, Safe, Accessible -Current dietary patterns: she is limiting the amount of fried and fatty foods that she is eating  -Current exercise  habits: she is working in her catering business, she does a lot of cooking, she is going to do her line dancing classes -Educated on Cholesterol goals;  Benefits of statin for ASCVD risk reduction; Importance of limiting foods high in cholesterol; -Recommended to continue current medication -Recommended patients current cholesterol medication be increased to 40 mg tablet once per day   Diabetes (A1c goal <7%) -Not ideally controlled -Current medications: Trulicity 1.5 HI/1.5 ml - inject once per week Appropriate, Effective, Safe, Accessible Farxiga 10 mg tablet once per day Appropriate, Effective, Safe, Accessible Insulin Lispro (Humalog Kwikpen) 100 unit/ml - Inject subcutaneously 2 units if sugars are 150 - 199, 4 units 200 -249, 6 units 250- 299, 8 units 300 - 349, 10 units >350 Appropriate, Effective, Safe, Accessible -Current home glucose readings : CGM - she has been using her Libre  fasting glucose: 88-100, Patient reports that her time with in range is 7 out of 7 days  -Denies hypoglycemic/hyperglycemic symptoms -Current meal patterns:  breakfast: cereal with some sugar on it -honey nut cheerios with sugar drinks: patient reports that she is drinking plenty of water  -Current exercise: she is doing a lot of cleaning around the house, and odds and ends  -Educated on A1c and blood sugar goals; Continuous glucose monitoring; -She has lost 20 pounds and I congratulated her on this accomplishment -She has not needed insulin as frequently only once in the past two weeks.  -Counseled to check feet daily and get yearly eye exams -Recommended to continue current medication  Patient Goals/Self-Care Activities Patient will:  - take medications as prescribed as evidenced by patient report and record review  Follow Up Plan: The patient has been provided with contact information for the care management team and has been advised to call with any health related questions or concerns.        Medication Assistance: None required.  Patient affirms current coverage meets needs.  Compliance/Adherence/Medication fill history: Care Gaps: COVID-19 Vaccine  Booster- Patient received 05/01/2021 Pneumonia Vaccine   Star-Rating Drugs: Farxiga 10 mg tablet  Rosuvastatin 20 mg tablet  Trulicity 1.5 KS/8.4 ml SOPN  Patient's preferred pharmacy is:  [facility] [place], [address] [place] [address] Phone: [phone] Fax: [phone]  [place], [address] [address] Phone: [phone] Fax: [phone]  Uses pill box? Yes Pt endorses 95% compliance  We discussed: Benefits of medication synchronization, packaging and delivery as well as enhanced pharmacist oversight with [employer]. Patient decided to: Continue current medication management strategy  Care Plan and Follow Up Patient Decision:  Patient agrees to Care Plan and Follow-up.  Plan: The patient has been provided with contact information for the care management team and has been advised to call with any health related questions or concerns.   Orlando Penner, CPP, PharmD Clinical Pharmacist Practitioner [facility] [phone]

## 2021-07-09 NOTE — Patient Instructions (Signed)
Visit Information It was great speaking with you today!  Please let me know if you have any questions about our visit.   Goals Addressed             This Visit's Progress    Manage My Medicine       Timeframe:  Long-Range Goal Priority:  High Start Date:        01/09/2021                     Expected End Date:                       Follow Up Date 01/01/2022  In Progress:   - call for medicine refill 2 or 3 days before it runs out - call if I am sick and can't take my medicine - keep a list of all the medicines I take; vitamins and herbals too - use a pillbox to sort medicine - use an alarm clock or phone to remind me to take my medicine    Why is this important?   These steps will help you keep on track with your medicines.   Notes:  Please call if you have any questions.         Patient Care Plan: CCM Pharmacy Care Plan     Problem Identified: PAD, DM II   Priority: High     Long-Range Goal: Disease Management   Note:   Current Barriers:  Unable to independently monitor therapeutic efficacy Unable to achieve control of diabetes   Pharmacist Clinical Goal(s):  Patient will achieve adherence to monitoring guidelines and medication adherence to achieve therapeutic efficacy through collaboration with PharmD and provider.   Interventions: 1:1 collaboration with [name], MD regarding development and update of comprehensive plan of care as evidenced by provider attestation and co-signature Inter-disciplinary care team collaboration (see longitudinal plan of care) Comprehensive medication review performed; medication list updated in electronic medical record  Peripheral Arterial Disease: (LDL goal < 55) -Not ideally controlled -Current treatment: Rosuvastatin 20 mg tablet once per day  Metoprolol tartrate 50 mg tablet twice per day Amlodipine 10 mg tablet once per day Aspirin 81 mg tablet once per day  -Current dietary patterns: she is limiting the amount  of fried and fatty foods that she is eating  -Current exercise habits: she is working in her catering business, she does a lot of cooking, she is going to do her line dancing classes -Educated on Cholesterol goals;  Benefits of statin for ASCVD risk reduction; Importance of limiting foods high in cholesterol; -Recommended to continue current medication -Recommended patients current cholesterol medication be increased to 40 mg tablet once per day   Diabetes (A1c goal <7%) -Not ideally controlled -Current medications: Trulicity 1.5 mg/0.5 ml - inject once per week Farxiga 10 mg tablet once per day  Insulin Lispro (Humalog Kwikpen) 100 unit/ml - Inject subcutaneously 2 units if sugars are 150 - 199, 4 units 200 -249, 6 units 250- 299, 8 units 300 - 349, 10 units >350 -Current home glucose readings : CGM - she has been using her Libre  fasting glucose: 88-100, Patient reports that her time with in range is 7 out of 7 days  -Denies hypoglycemic/hyperglycemic symptoms -Current meal patterns:  breakfast: cereal with some sugar on it -honey nut cheerios with sugar drinks: patient reports that she is drinking plenty of water  -Current exercise: she is doing a lot of cleaning around  the house, and odds and ends  -Educated on A1c and blood sugar goals; Continuous glucose monitoring; -She has lost 20 pounds and I congratulated her on this accomplishment -She has not needed insulin as frequently only once in the past two weeks.  -Counseled to check feet daily and get yearly eye exams -Recommended to continue current medication  Patient Goals/Self-Care Activities Patient will:  - take medications as prescribed as evidenced by patient report and record review  Follow Up Plan: The patient has been provided with contact information for the care management team and has been advised to call with any health related questions or concerns.       Patient agreed to services and verbal consent obtained.    The patient verbalized understanding of instructions, educational materials, and care plan provided today and agreed to receive a mailed copy of patient instructions, educational materials, and care plan.   Cherylin Mylar, PharmD Clinical Pharmacist [facility] [phone]

## 2021-07-10 DIAGNOSIS — U071 COVID-19: Secondary | ICD-10-CM | POA: Diagnosis not present

## 2021-07-10 DIAGNOSIS — Z20822 Contact with and (suspected) exposure to covid-19: Secondary | ICD-10-CM | POA: Diagnosis not present

## 2021-07-10 NOTE — Telephone Encounter (Signed)
Error

## 2021-07-11 DIAGNOSIS — Z20822 Contact with and (suspected) exposure to covid-19: Secondary | ICD-10-CM | POA: Diagnosis not present

## 2021-07-12 DIAGNOSIS — Z20822 Contact with and (suspected) exposure to covid-19: Secondary | ICD-10-CM | POA: Diagnosis not present

## 2021-07-16 DIAGNOSIS — Z794 Long term (current) use of insulin: Secondary | ICD-10-CM | POA: Diagnosis not present

## 2021-07-16 DIAGNOSIS — E1165 Type 2 diabetes mellitus with hyperglycemia: Secondary | ICD-10-CM | POA: Diagnosis not present

## 2021-07-17 DIAGNOSIS — H2512 Age-related nuclear cataract, left eye: Secondary | ICD-10-CM | POA: Diagnosis not present

## 2021-07-19 DIAGNOSIS — H25812 Combined forms of age-related cataract, left eye: Secondary | ICD-10-CM | POA: Diagnosis not present

## 2021-07-23 DIAGNOSIS — Z794 Long term (current) use of insulin: Secondary | ICD-10-CM | POA: Diagnosis not present

## 2021-07-23 DIAGNOSIS — N182 Chronic kidney disease, stage 2 (mild): Secondary | ICD-10-CM | POA: Diagnosis not present

## 2021-07-23 DIAGNOSIS — E1122 Type 2 diabetes mellitus with diabetic chronic kidney disease: Secondary | ICD-10-CM

## 2021-07-30 ENCOUNTER — Telehealth: Payer: Self-pay

## 2021-07-30 NOTE — Telephone Encounter (Signed)
°  Chronic Care Management   Follow Up Note   07/30/2021 Name: [name] [name] MRN: [id number] DOB: 08-Jun-1951  Referred by: [name], MD Reason for referral : No chief complaint on file.    CARE PLAN ENTRY (see longitudinal plan of care for additional care plan information)  Current Barriers:  Financial Barriers: patient has [employer] and reports copay for [name] is cost prohibitive at this time  Pharmacist Clinical Goal(s):  Over the next 30 days, patient will work with PharmD and providers to relieve medication access concerns  Interventions: Comprehensive medication review completed; medication list updated in electronic medical record.  Inter-disciplinary care team collaboration (see longitudinal plan of care) Contacted patient and let her know that her medication was at the office when she was ready to pick it up.   Patient Self Care Activities:  Patient will pick up medication that is at [facility].  Please see past updates related to this goal by clicking on the "Past Updates" button in the selected goal    Cherylin Mylar, CPP, PharmD Clinical Pharmacist Practitioner [facility] [phone]

## 2021-07-31 ENCOUNTER — Other Ambulatory Visit: Payer: Self-pay | Admitting: Internal Medicine

## 2021-08-01 DIAGNOSIS — N281 Cyst of kidney, acquired: Secondary | ICD-10-CM | POA: Diagnosis not present

## 2021-08-08 ENCOUNTER — Other Ambulatory Visit: Payer: Self-pay

## 2021-08-08 ENCOUNTER — Encounter: Payer: Self-pay | Admitting: Internal Medicine

## 2021-08-08 MED ORDER — GVOKE HYPOPEN 2-PACK 0.5 MG/0.1ML ~~LOC~~ SOAJ
SUBCUTANEOUS | 3 refills | Status: DC
Start: 2021-08-08 — End: 2022-10-27

## 2021-08-09 ENCOUNTER — Telehealth: Payer: Self-pay

## 2021-08-09 NOTE — Telephone Encounter (Signed)
Spoke with pt yesterday. Pt states that she is getting alerts at 3 am from her CGM that her blood sugar is in the 60's. She states that she wakes up and drinks 1/4 cup of juice and when she awakens her blood sugar is around 99.   Pt was instructed to have a light snack before bed to help prevent this.   Rx was sent for GVOKE for the pt to take if blood sugar is below 60.   Pt agrees and understands.

## 2021-08-11 ENCOUNTER — Other Ambulatory Visit: Payer: Self-pay | Admitting: Internal Medicine

## 2021-08-12 ENCOUNTER — Other Ambulatory Visit: Payer: Self-pay | Admitting: Internal Medicine

## 2021-08-12 DIAGNOSIS — Z794 Long term (current) use of insulin: Secondary | ICD-10-CM

## 2021-08-12 DIAGNOSIS — Z20822 Contact with and (suspected) exposure to covid-19: Secondary | ICD-10-CM | POA: Diagnosis not present

## 2021-08-12 DIAGNOSIS — E1122 Type 2 diabetes mellitus with diabetic chronic kidney disease: Secondary | ICD-10-CM

## 2021-08-13 ENCOUNTER — Encounter: Payer: Medicare Other | Admitting: Internal Medicine

## 2021-08-13 NOTE — Progress Notes (Signed)
No show

## 2021-08-13 NOTE — Patient Instructions (Signed)

## 2021-08-15 ENCOUNTER — Encounter: Payer: Self-pay | Admitting: Internal Medicine

## 2021-08-15 DIAGNOSIS — E1165 Type 2 diabetes mellitus with hyperglycemia: Secondary | ICD-10-CM | POA: Diagnosis not present

## 2021-08-15 DIAGNOSIS — Z794 Long term (current) use of insulin: Secondary | ICD-10-CM | POA: Diagnosis not present

## 2021-08-16 DIAGNOSIS — Z1152 Encounter for screening for COVID-19: Secondary | ICD-10-CM | POA: Diagnosis not present

## 2021-08-21 ENCOUNTER — Other Ambulatory Visit: Payer: Self-pay

## 2021-08-21 MED ORDER — ALBUTEROL SULFATE HFA 108 (90 BASE) MCG/ACT IN AERS: 2.0000 | INHALATION_SPRAY | Freq: Four times a day (QID) | RESPIRATORY_TRACT | 2 refills | Status: DC | PRN

## 2021-08-22 ENCOUNTER — Encounter: Payer: Self-pay | Admitting: Internal Medicine

## 2021-08-22 ENCOUNTER — Other Ambulatory Visit: Payer: Self-pay

## 2021-08-22 ENCOUNTER — Ambulatory Visit (INDEPENDENT_AMBULATORY_CARE_PROVIDER_SITE_OTHER): Payer: Medicare Other | Admitting: Internal Medicine

## 2021-08-22 VITALS — BP 122/66 | HR 70 | Temp 98.3°F | Ht 63.4 in | Wt 175.0 lb

## 2021-08-22 DIAGNOSIS — N182 Chronic kidney disease, stage 2 (mild): Secondary | ICD-10-CM

## 2021-08-22 DIAGNOSIS — Z794 Long term (current) use of insulin: Secondary | ICD-10-CM | POA: Diagnosis not present

## 2021-08-22 DIAGNOSIS — E162 Hypoglycemia, unspecified: Secondary | ICD-10-CM

## 2021-08-22 DIAGNOSIS — Z6830 Body mass index (BMI) 30.0-30.9, adult: Secondary | ICD-10-CM

## 2021-08-22 DIAGNOSIS — I1310 Hypertensive heart and chronic kidney disease without heart failure, with stage 1 through stage 4 chronic kidney disease, or unspecified chronic kidney disease: Secondary | ICD-10-CM

## 2021-08-22 DIAGNOSIS — I700 Atherosclerosis of aorta: Secondary | ICD-10-CM | POA: Diagnosis not present

## 2021-08-22 DIAGNOSIS — Z87891 Personal history of nicotine dependence: Secondary | ICD-10-CM

## 2021-08-22 DIAGNOSIS — E6609 Other obesity due to excess calories: Secondary | ICD-10-CM

## 2021-08-22 DIAGNOSIS — H5213 Myopia, bilateral: Secondary | ICD-10-CM | POA: Diagnosis not present

## 2021-08-22 DIAGNOSIS — I25119 Atherosclerotic heart disease of native coronary artery with unspecified angina pectoris: Secondary | ICD-10-CM

## 2021-08-22 DIAGNOSIS — R195 Other fecal abnormalities: Secondary | ICD-10-CM | POA: Diagnosis not present

## 2021-08-22 DIAGNOSIS — E1122 Type 2 diabetes mellitus with diabetic chronic kidney disease: Secondary | ICD-10-CM | POA: Diagnosis not present

## 2021-08-22 NOTE — Patient Instructions (Signed)

## 2021-08-22 NOTE — Progress Notes (Signed)
I,[name] [name],acting as a Education administrator for [name] Greenland, MD.,have documented all relevant documentation on the behalf of [name] Greenland, MD,as directed by  [name] Greenland, MD while in the presence of [name] Greenland, MD.  This visit occurred during the SARS-CoV-2 public health emergency.  Safety protocols were in place, including screening questions prior to the visit, additional usage of staff PPE, and extensive cleaning of exam room while observing appropriate contact time as indicated for disinfecting solutions.  Subjective:     Patient ID: [name] [name] , female    DOB: [birth year] , 71 y.o.   MRN: [id number]   Chief Complaint  Patient presents with   Diabetes   Hypertension    HPI  She is here today for diabetes/BP check. She reports compliance with meds. She denies headaches, chest pain and shortness of breath. She reports feeling good. She states that she has had to use her GVOKE pen due to her blood sugar being 53.  She has noticed her sugars don't drop in am as much because she has started eating a bedtime snack.   She thinks her sugars are better controlled now because she is more active. She now has baking/catering business - so this keeps her active and she is snacking less. She has no other concerns.   Diabetes She presents for her follow-up diabetic visit. She has type 2 diabetes mellitus. Her disease course has been improving. Pertinent negatives for hypoglycemia include no dizziness or headaches. Pertinent negatives for diabetes include no blurred vision, no chest pain, no fatigue, no polydipsia, no polyphagia and no polyuria. There are no hypoglycemic complications. Risk factors for coronary artery disease include diabetes mellitus, dyslipidemia, hypertension and post-menopausal. Current diabetic treatment includes insulin injections. She is compliant with treatment most of the time. She is following a diabetic diet. She participates in exercise intermittently. Eye exam is  current.  Hypertension This is a chronic problem. The current episode started more than 1 year ago. The problem has been gradually improving since onset. The problem is controlled. Pertinent negatives include no blurred vision, chest pain, headaches or palpitations. Past treatments include angiotensin blockers. The current treatment provides moderate improvement. Compliance problems include exercise and diet.  Hypertensive end-organ damage includes kidney disease. Identifiable causes of hypertension include chronic renal disease.    Past Medical History:  Diagnosis Date   Anemia    Carpal tunnel syndrome    Coronary artery disease    DES to ostial RCA 2005   Diabetes mellitus without complication (HCC)    No longer taking meds   Dyslipidemia    Hypertension    Osteoarthritis    PAD (peripheral artery disease) (HCC)    Sleep apnea      Family History  Problem Relation Age of Onset   CAD Father 43   Heart attack Father    CAD Mother 87   Heart attack Mother    CAD Brother    Cancer Brother        prostate   CAD Sister    Cancer Sister        breast   CAD Brother      Current Outpatient Medications:    Accu-Chek FastClix Lancets MISC, CHECK BLOOD SUGAR THREE TIMES DAILY dx: e11.65, Disp: 100 each, Rfl: 2   albuterol (VENTOLIN HFA) 108 (90 Base) MCG/ACT inhaler, Inhale 2 puffs into the lungs every 6 (six) hours as needed for wheezing or shortness of breath., Disp: 8 g, Rfl: 2  amLODipine (NORVASC) 10 MG tablet, TAKE 1 TABLET(10 MG) BY MOUTH DAILY, Disp: 90 tablet, Rfl: 1   Ascorbic Acid (VITAMIN C) 500 MG CHEW, 1 tablet, Disp: , Rfl:    aspirin EC 81 MG tablet, Take 81 mg by mouth daily. Swallow whole., Disp: , Rfl:    Cholecalciferol (CVS VIT D 5000 HIGH-POTENCY PO), Take 1 tablet by mouth daily., Disp: , Rfl:    Coenzyme Q10 (COQ-10) 100 MG CAPS, Take 1 tablet by mouth daily. , Disp: , Rfl:    Continuous Blood Gluc Receiver (FREESTYLE LIBRE 2 READER) DEVI, Use as directed to  check blood sugars 3 times per day dx: e11.65, Disp: 1 each, Rfl: 1   Cyanocobalamin (VITAMIN B-12 PO), Take 500 mcg by mouth., Disp: , Rfl:    FARXIGA 10 MG TABS tablet, TAKE 1 TABLET(10 MG) BY MOUTH DAILY BEFORE AND BREAKFAST, Disp: 30 tablet, Rfl: 2   FLUAD QUADRIVALENT 0.5 ML injection, , Disp: , Rfl:    Glucagon (GVOKE HYPOPEN 2-PACK) 0.5 MG/0.1ML SOAJ, Use for blood sugars less than 60., Disp: 0.2 mL, Rfl: 3   glucosamine-chondroitin 500-400 MG tablet, Take 1 tablet by mouth daily. Reported on 12/17/2015, Disp: , Rfl:    glucose blood (ONETOUCH VERIO) test strip, CHECK BLOOD SUGAR THREE TIMES DAILY dx: e11.65, Disp: 150 each, Rfl: 11   insulin lispro (HUMALOG KWIKPEN) 100 UNIT/ML KwikPen, Inject subcutaneously 2 units if sugars are 150 - 199, 4 units 200 -249, 6 units 250- 299, 8 units 300 - 349, 10 units >350, Disp: 15 mL, Rfl: 0   Insulin Pen Needle (BD PEN NEEDLE NANO 2ND GEN) 32G X 4 MM MISC, USE TO INJECT INSULIN DAILY, Disp: 100 each, Rfl: 2   MAGNESIUM OXIDE PO, Take 400 mg by mouth daily., Disp: , Rfl:    meloxicam (MOBIC) 15 MG tablet, Take 15 mg by mouth daily as needed., Disp: , Rfl:    metoprolol tartrate (LOPRESSOR) 50 MG tablet, TAKE 1 TABLET BY MOUTH TWICE DAILY, Disp: 180 tablet, Rfl: 4   Misc Natural Products (NEURIVA) CAPS, Take 1 capsule by mouth daily., Disp: , Rfl:    nystatin (NYSTATIN) powder, Apply topically 3 (three) times daily. As needed, Disp: 60 g, Rfl: 0   omeprazole (PRILOSEC) 20 MG capsule, TAKE 1 CAPSULE BY MOUTH DAILY, Disp: 90 capsule, Rfl: 1   OneTouch Delica Lancets 03K MISC, USE AS DIRECTED TO CHECK BLOOD SUGAR THREE TIMES DAILY, Disp: 100 each, Rfl: 0   PFIZER COVID-19 VAC BIVALENT injection, , Disp: , Rfl:    PFIZER-BIONT COVID-19 VAC-TRIS SUSP injection, , Disp: , Rfl:    pregabalin (LYRICA) 50 MG capsule, Take 1 capsule (50 mg total) by mouth at bedtime., Disp: 90 capsule, Rfl: 1   PREVNAR 20 0.5 ML injection, , Disp: , Rfl:    Psyllium (METAMUCIL  FIBER PO), Take by mouth., Disp: , Rfl:    rosuvastatin (CRESTOR) 20 MG tablet, TAKE 1 TABLET(20 MG) BY MOUTH DAILY, Disp: 90 tablet, Rfl: 1   traMADol (ULTRAM) 50 MG tablet, Take 1 tablet (50 mg total) by mouth every 6 (six) hours as needed., Disp: 20 tablet, Rfl: 0   traZODone (DESYREL) 50 MG tablet, TAKE 1 TABLET BY MOUTH EVERY NIGHT AT BEDTIME, Disp: 90 tablet, Rfl: 1   TRULICITY 1.5 JZ/7.9XT SOPN, ADMINISTER 1.5 MG UNDER THE SKIN 1 TIME A WEEK, Disp: 6 mL, Rfl: 3   Vitamin A 3 MG (10000 UT) TABS, Take 1 tablet by mouth daily. 2,400 mcg,  Disp: , Rfl:    Vitamin D, Ergocalciferol, (DRISDOL) 1.25 MG (50000 UNIT) CAPS capsule, TAKE 1 CAPSULE BY MOUTH EVERY 7 DAYS, Disp: 12 capsule, Rfl: 1   vitamin E 600 UNIT capsule, Take 400 Units by mouth daily., Disp: , Rfl:    Allergies  Allergen Reactions   Fish Allergy     Cod   Latex Rash     Review of Systems  Constitutional: Negative.  Negative for fatigue.  Eyes:  Negative for blurred vision.  Respiratory: Negative.    Cardiovascular: Negative.  Negative for chest pain and palpitations.  Gastrointestinal: Negative.        She reports having a change in the character of her stools. She reports they are more narrow, also has small balls on occasion. She denies having this type of fluctuation with her bowels in the past.  Denies n/v/abdominal pain. Not sure what is triggering her sx.   Endocrine: Negative for polydipsia, polyphagia and polyuria.  Neurological: Negative.  Negative for dizziness and headaches.    Today's Vitals   08/22/21 0942  BP: 122/66  Pulse: 70  Temp: 98.3 F (36.8 C)  TempSrc: Oral  Weight: 175 lb (79.4 kg)  Height: 5' 3.4" (1.61 m)   Body mass index is 30.61 kg/m.  Wt Readings from Last 3 Encounters:  08/22/21 175 lb (79.4 kg)  07/04/21 175 lb 14.8 oz (79.8 kg)  07/03/21 176 lb (79.8 kg)    Objective:  Physical Exam Vitals and nursing note reviewed.  Constitutional:      Appearance: Normal appearance.   HENT:     Head: Normocephalic and atraumatic.     Nose:     Comments: Masked     Mouth/Throat:     Comments: Masked  Eyes:     Extraocular Movements: Extraocular movements intact.  Cardiovascular:     Rate and Rhythm: Normal rate and regular rhythm.     Heart sounds: Normal heart sounds.  Pulmonary:     Effort: Pulmonary effort is normal.     Breath sounds: Normal breath sounds.  Abdominal:     General: Bowel sounds are normal.  Musculoskeletal:     Cervical back: Normal range of motion.  Skin:    General: Skin is warm.  Neurological:     General: No focal deficit present.     Mental Status: She is alert.  Psychiatric:        Mood and Affect: Mood normal.        Behavior: Behavior normal.     Assessment And Plan:     1. Type 2 diabetes mellitus with stage 2 chronic kidney disease, with long-term current use of insulin (HCC) Comments: Chronic, I will check labs as below. Sugars have improved with use of Farxiga, will consider decreasing dose of Trulicity.  - Hemoglobin A1c - BMP8+EGFR  2. Hypertensive heart and renal disease with renal failure, stage 1 through stage 4 or unspecified chronic kidney disease, without heart failure Comments: Chronic, well controlled.  No med changes. She is encouraged to follow low sodium diet.   3. Atherosclerosis of aorta ([place]) Comments: Chronic, encouraged to follow heart healthy lifestyle. She is on ASA and statin therapy, importance of compliance was stressed to the patient.   4. Atherosclerosis of native coronary artery of native heart with angina pectoris ([place]) Comments: Please see above.   5. Change in stool caliber Comments: I will refer her to GI for further evaluation. She is in agreement with her treatment  plan.  - Ambulatory referral to Gastroenterology  6. Hypoglycemia Comments: She will continue to use GVOKE pen prn. We did discuss that I may need to decrease her dose of Trulicity, will make decision after reviewing labs.    7. Class 1 obesity due to excess calories with serious comorbidity and body mass index (BMI) of 30.0 to 30.9 in adult Comments: Her BMI is acceptable for her demographic. She is encouraged to aim for at least 150 minutes of exercise per week.   8. History of smoking 25-50 pack years Comments: She agrees to referral for low dose CT chest screen.  - CT CHEST LUNG CA SCREEN LOW DOSE W/O CM; Future  Patient was given opportunity to ask questions. Patient verbalized understanding of the plan and was able to repeat key elements of the plan. All questions were answered to their satisfaction.   I, [name] Greenland, MD, have reviewed all documentation for this visit. The documentation on 08/22/21 for the exam, diagnosis, procedures, and orders are all accurate and complete.   IF YOU HAVE BEEN REFERRED TO A SPECIALIST, IT MAY TAKE 1-2 WEEKS TO SCHEDULE/PROCESS THE REFERRAL. IF YOU HAVE NOT HEARD FROM US/SPECIALIST IN TWO WEEKS, PLEASE GIVE [place] A CALL AT [phone].   THE PATIENT IS ENCOURAGED TO PRACTICE SOCIAL DISTANCING DUE TO THE COVID-19 PANDEMIC.

## 2021-08-23 LAB — BMP8+EGFR
BUN/Creatinine Ratio: 11 — ABNORMAL LOW (ref 12–28)
BUN: 8 mg/dL (ref 8–27)
CO2: 25 mmol/L (ref 20–29)
Calcium: 9.4 mg/dL (ref 8.7–10.3)
Chloride: 101 mmol/L (ref 96–106)
Creatinine, Ser: 0.73 mg/dL (ref 0.57–1.00)
Glucose: 89 mg/dL (ref 70–99)
Potassium: 3.6 mmol/L (ref 3.5–5.2)
Sodium: 141 mmol/L (ref 134–144)
eGFR: 88 mL/min/{1.73_m2} (ref >59)

## 2021-08-23 LAB — HEMOGLOBIN A1C
Est. average glucose Bld gHb Est-mCnc: 160 mg/dL
Hgb A1c MFr Bld: 7.2 % — ABNORMAL HIGH (ref 4.8–5.6)

## 2021-08-29 ENCOUNTER — Other Ambulatory Visit: Payer: Self-pay | Admitting: Cardiology

## 2021-09-05 ENCOUNTER — Ambulatory Visit (INDEPENDENT_AMBULATORY_CARE_PROVIDER_SITE_OTHER): Payer: Medicare Other | Admitting: Sports Medicine

## 2021-09-05 ENCOUNTER — Encounter: Payer: Self-pay | Admitting: Sports Medicine

## 2021-09-05 ENCOUNTER — Other Ambulatory Visit: Payer: Self-pay

## 2021-09-05 DIAGNOSIS — M79674 Pain in right toe(s): Secondary | ICD-10-CM | POA: Diagnosis not present

## 2021-09-05 DIAGNOSIS — M79675 Pain in left toe(s): Secondary | ICD-10-CM | POA: Diagnosis not present

## 2021-09-05 DIAGNOSIS — E119 Type 2 diabetes mellitus without complications: Secondary | ICD-10-CM | POA: Diagnosis not present

## 2021-09-05 DIAGNOSIS — B351 Tinea unguium: Secondary | ICD-10-CM | POA: Diagnosis not present

## 2021-09-05 NOTE — Progress Notes (Signed)
?Subjective: ?[name] [name] is a 71 y.o. female patient with history of diabetes who presents to office today complaining of long,mildly painful nails  while ambulating in shoes; unable to trim. Patient states that the glucose reading this morning was 110 mg/dl. Patient reports that her A1c 7.2.  Last visit with PCP Dr. Baird Cancer 08/22/2021. ? ?Patient Active Problem List  ? Diagnosis Date Noted  ? Parenchymal renal hypertension 07/04/2020  ? Educated about COVID-19 virus infection 03/25/2020  ? OSA and COPD overlap syndrome ([place]) 03/14/2019  ? Snoring 02/17/2019  ? Peripheral arterial disease ([place]) 01/26/2019  ? Hepatitis C carrier ([place]) 01/26/2019  ? Hypertensive heart and renal disease 08/19/2018  ? Class 1 obesity due to excess calories with serious comorbidity and body mass index (BMI) of 33.0 to 33.9 in adult 08/19/2018  ? Paresthesia of left upper extremity 06/29/2018  ? Atherosclerosis of native coronary artery of native heart with angina pectoris ([place]) 04/09/2018  ? Dyslipidemia 04/09/2018  ? Primary osteoarthritis of both feet 11/05/2017  ? Primary osteoarthritis of both hands 10/30/2017  ? Primary osteoarthritis of both knees 10/30/2017  ? Primary insomnia 10/30/2017  ? Family history of lupus erythematosus 10/30/2017  ? Dyspnea on exertion 12/10/2016  ? Type 2 diabetes mellitus with stage 2 chronic kidney disease, with long-term current use of insulin ([place]) 09/21/2014  ? Acute cystitis 09/21/2014  ? Hyperglycemia 09/19/2014  ? Bradycardia 09/19/2014  ? Hyponatremia 09/19/2014  ? Elevated alkaline phosphatase level 09/19/2014  ? Acute kidney injury ([place])   ? Essential hypertension, benign 07/05/2008  ? Coronary atherosclerosis 07/05/2008  ? TOBACCO USER 07/03/2008  ? ?Current Outpatient Medications on File Prior to Visit  ?Medication Sig Dispense Refill  ? Accu-Chek FastClix Lancets MISC CHECK BLOOD SUGAR THREE TIMES DAILY dx: e11.65 100 each 2  ? albuterol (VENTOLIN HFA) 108 (90 Base) MCG/ACT inhaler  Inhale 2 puffs into the lungs every 6 (six) hours as needed for wheezing or shortness of breath. 8 g 2  ? amLODipine (NORVASC) 10 MG tablet TAKE 1 TABLET(10 MG) BY MOUTH DAILY 90 tablet 1  ? Ascorbic Acid (VITAMIN C) 500 MG CHEW 1 tablet    ? aspirin EC 81 MG tablet Take 81 mg by mouth daily. Swallow whole.    ? Cholecalciferol (CVS VIT D 5000 HIGH-POTENCY PO) Take 1 tablet by mouth daily.    ? Coenzyme Q10 (COQ-10) 100 MG CAPS Take 1 tablet by mouth daily.     ? Continuous Blood Gluc Receiver (FREESTYLE LIBRE 2 READER) DEVI Use as directed to check blood sugars 3 times per day dx: e11.65 1 each 1  ? Cyanocobalamin (VITAMIN B-12 PO) Take 500 mcg by mouth.    ? FARXIGA 10 MG TABS tablet TAKE 1 TABLET(10 MG) BY MOUTH DAILY BEFORE AND BREAKFAST 30 tablet 2  ? FLUAD QUADRIVALENT 0.5 ML injection     ? Glucagon (GVOKE HYPOPEN 2-PACK) 0.5 MG/0.1ML SOAJ Use for blood sugars less than 60. 0.2 mL 3  ? glucosamine-chondroitin 500-400 MG tablet Take 1 tablet by mouth daily. Reported on 12/17/2015    ? glucose blood (ONETOUCH VERIO) test strip CHECK BLOOD SUGAR THREE TIMES DAILY dx: e11.65 150 each 11  ? insulin lispro (HUMALOG KWIKPEN) 100 UNIT/ML KwikPen Inject subcutaneously 2 units if sugars are 150 - 199, 4 units 200 -249, 6 units 250- 299, 8 units 300 - 349, 10 units >350 15 mL 0  ? Insulin Pen Needle (BD PEN NEEDLE NANO 2ND GEN) 32G X 4 MM MISC  USE TO INJECT INSULIN DAILY 100 each 2  ? MAGNESIUM OXIDE PO Take 400 mg by mouth daily.    ? meloxicam (MOBIC) 15 MG tablet Take 15 mg by mouth daily as needed.    ? metoprolol tartrate (LOPRESSOR) 50 MG tablet TAKE 1 TABLET BY MOUTH TWICE DAILY 180 tablet 4  ? Misc Natural Products (NEURIVA) CAPS Take 1 capsule by mouth daily.    ? nystatin (NYSTATIN) powder Apply topically 3 (three) times daily. As needed 60 g 0  ? omeprazole (PRILOSEC) 20 MG capsule TAKE 1 CAPSULE BY MOUTH DAILY 90 capsule 1  ? OneTouch Delica Lancets 16X MISC USE AS DIRECTED TO CHECK BLOOD SUGAR THREE TIMES  DAILY 100 each 0  ? PFIZER COVID-19 VAC BIVALENT injection     ? PFIZER-BIONT COVID-19 VAC-TRIS SUSP injection     ? prednisoLONE acetate (PRED FORTE) 1 % ophthalmic suspension Place 1 drop into the right eye 4 (four) times daily.    ? pregabalin (LYRICA) 50 MG capsule Take 1 capsule (50 mg total) by mouth at bedtime. 90 capsule 1  ? PREVNAR 20 0.5 ML injection     ? Psyllium (METAMUCIL FIBER PO) Take by mouth.    ? rosuvastatin (CRESTOR) 20 MG tablet TAKE 1 TABLET(20 MG) BY MOUTH DAILY 90 tablet 3  ? traMADol (ULTRAM) 50 MG tablet Take 1 tablet (50 mg total) by mouth every 6 (six) hours as needed. 20 tablet 0  ? traZODone (DESYREL) 50 MG tablet TAKE 1 TABLET BY MOUTH EVERY NIGHT AT BEDTIME 90 tablet 1  ? TRULICITY 1.5 WR/6.0AV SOPN ADMINISTER 1.5 MG UNDER THE SKIN 1 TIME A WEEK 6 mL 3  ? Vitamin A 3 MG (10000 UT) TABS Take 1 tablet by mouth daily. 2,400 mcg    ? Vitamin D, Ergocalciferol, (DRISDOL) 1.25 MG (50000 UNIT) CAPS capsule TAKE 1 CAPSULE BY MOUTH EVERY 7 DAYS 12 capsule 1  ? vitamin E 600 UNIT capsule Take 400 Units by mouth daily.    ? ?No current facility-administered medications on file prior to visit.  ? ?Allergies  ?Allergen Reactions  ? Fish Allergy   ?  [place]  ? Latex Rash  ? ? ?Recent Results (from the past 2160 hour(s))  ?Hemoglobin A1c     Status: Abnormal  ? Collection Time: 08/22/21 10:16 AM  ?Result Value Ref Range  ? Hgb A1c MFr Bld 7.2 (H) 4.8 - 5.6 %  ?  Comment:          Prediabetes: 5.7 - 6.4 ?         Diabetes: >6.4 ?         Glycemic control for adults with diabetes: <7.0 ?  ? Est. average glucose Bld gHb Est-mCnc 160 mg/dL  ?BMP8+EGFR     Status: Abnormal  ? Collection Time: 08/22/21 10:16 AM  ?Result Value Ref Range  ? Glucose 89 70 - 99 mg/dL  ? BUN 8 8 - 27 mg/dL  ? Creatinine, Ser 0.73 0.57 - 1.00 mg/dL  ? eGFR 88 >59 mL/min/1.73  ? BUN/Creatinine Ratio 11 (L) 12 - 28  ? Sodium 141 134 - 144 mmol/L  ? Potassium 3.6 3.5 - 5.2 mmol/L  ? Chloride 101 96 - 106 mmol/L  ? CO2 25 20 - 29  mmol/L  ? Calcium 9.4 8.7 - 10.3 mg/dL  ? ? ?Objective: ?General: Patient is awake, alert, and oriented x 3 and in no acute distress. ? ?Integument: Skin is warm, dry and supple bilateral.  Nails are well manicured.  No signs of infection.  Minimal reactive keratosis plantar right foot.  Remaining integument unremarkable. ? ?Vasculature:  Dorsalis Pedis pulse 1/4 bilateral. Posterior Tibial pulse  1/4 bilateral. Capillary fill time <5 sec 1-5 bilateral. Positive hair growth to the level of the digits.Temperature gradient within normal limits. No varicosities present bilateral. No edema present bilateral.  ? ?Neurology: Gross sensation present via light touch bilateral. ? ?Musculoskeletal: Asymptomatic  Right bunion, pes planus and hammertoe pedal deformities noted bilateral. Muscular strength 5/5 in all lower extremity muscular groups bilateral without pain on range of motion . No tenderness with calf compression bilateral. ? ?Assessment and Plan: ?Problem List Items Addressed This Visit   ?None ?Visit Diagnoses   ? ? Pain due to onychomycosis of toenails of both feet    -  Primary  ? Callus      ? Diabetes mellitus without complication ([place])      ? Foot pain, bilateral      ? ?  ? ? ?-Examined patient. ?-Re-Discussed importance of daily inspection and routine foot care in the setting of diabetes ?Mechanically debrided nails x10 using a sterile nail nipper without incident ?-Mechanically debrided keratosis sub met 4 and plantar lateral foot on right foot using 15 blade without incident  ?-Continue with daily skin emollients ?-Continue with good supportive shoes daily for foot type ?-Patient to return as scheduled in 3 months for diabetic foot with Dr. Elisha Ponder ?-Patient advised to call the office if any problems or questions arise in the meantime. ? ?Landis Martins, DPM ?

## 2021-09-11 ENCOUNTER — Other Ambulatory Visit: Payer: Self-pay | Admitting: Internal Medicine

## 2021-09-14 DIAGNOSIS — Z794 Long term (current) use of insulin: Secondary | ICD-10-CM | POA: Diagnosis not present

## 2021-09-14 DIAGNOSIS — E1165 Type 2 diabetes mellitus with hyperglycemia: Secondary | ICD-10-CM | POA: Diagnosis not present

## 2021-09-16 DIAGNOSIS — H524 Presbyopia: Secondary | ICD-10-CM | POA: Diagnosis not present

## 2021-09-17 ENCOUNTER — Encounter: Payer: Self-pay | Admitting: Internal Medicine

## 2021-09-18 ENCOUNTER — Other Ambulatory Visit: Payer: Self-pay | Admitting: Internal Medicine

## 2021-09-18 DIAGNOSIS — F17200 Nicotine dependence, unspecified, uncomplicated: Secondary | ICD-10-CM

## 2021-09-19 DIAGNOSIS — K5900 Constipation, unspecified: Secondary | ICD-10-CM | POA: Diagnosis not present

## 2021-09-20 ENCOUNTER — Ambulatory Visit
Admission: RE | Admit: 2021-09-20 | Discharge: 2021-09-20 | Disposition: A | Discharge status: Home / Self Care | Payer: Medicare Other | Source: Ambulatory Visit | Attending: Internal Medicine | Admitting: Internal Medicine

## 2021-09-20 DIAGNOSIS — F17200 Nicotine dependence, unspecified, uncomplicated: Secondary | ICD-10-CM

## 2021-09-20 DIAGNOSIS — I700 Atherosclerosis of aorta: Secondary | ICD-10-CM | POA: Diagnosis not present

## 2021-09-26 ENCOUNTER — Other Ambulatory Visit: Payer: Self-pay | Admitting: Internal Medicine

## 2021-10-01 LAB — HM DIABETES EYE EXAM

## 2021-10-09 ENCOUNTER — Telehealth: Payer: Self-pay

## 2021-10-09 NOTE — Chronic Care Management (AMB) (Signed)
? ? ?Chronic Care Management ?Pharmacy Assistant  ? ?Name: [name] [name]  MRN: [id number] DOB: 1950-10-10 ? ? ?Reason for Encounter: PAP ?  ?Medications: ?Outpatient Encounter Medications as of 10/09/2021  ?Medication Sig  ? amLODipine (NORVASC) 10 MG tablet TAKE 1 TABLET(10 MG) BY MOUTH DAILY  ? Accu-Chek FastClix Lancets MISC CHECK BLOOD SUGAR THREE TIMES DAILY dx: e11.65  ? albuterol (VENTOLIN HFA) 108 (90 Base) MCG/ACT inhaler Inhale 2 puffs into the lungs every 6 (six) hours as needed for wheezing or shortness of breath.  ? Ascorbic Acid (VITAMIN C) 500 MG CHEW 1 tablet  ? aspirin EC 81 MG tablet Take 81 mg by mouth daily. Swallow whole.  ? Cholecalciferol (CVS VIT D 5000 HIGH-POTENCY PO) Take 1 tablet by mouth daily.  ? Coenzyme Q10 (COQ-10) 100 MG CAPS Take 1 tablet by mouth daily.   ? Continuous Blood Gluc Receiver (FREESTYLE LIBRE 2 READER) DEVI Use as directed to check blood sugars 3 times per day dx: e11.65  ? Cyanocobalamin (VITAMIN B-12 PO) Take 500 mcg by mouth.  ? FARXIGA 10 MG TABS tablet TAKE 1 TABLET(10 MG) BY MOUTH DAILY BEFORE AND BREAKFAST  ? FLUAD QUADRIVALENT 0.5 ML injection   ? Glucagon (GVOKE HYPOPEN 2-PACK) 0.5 MG/0.1ML SOAJ Use for blood sugars less than 60.  ? glucosamine-chondroitin 500-400 MG tablet Take 1 tablet by mouth daily. Reported on 12/17/2015  ? glucose blood (ONETOUCH VERIO) test strip CHECK BLOOD SUGAR THREE TIMES DAILY dx: e11.65  ? insulin lispro (HUMALOG KWIKPEN) 100 UNIT/ML KwikPen Inject subcutaneously 2 units if sugars are 150 - 199, 4 units 200 -249, 6 units 250- 299, 8 units 300 - 349, 10 units >350  ? Insulin Pen Needle (BD PEN NEEDLE NANO 2ND GEN) 32G X 4 MM MISC USE TO INJECT INSULIN DAILY  ? MAGNESIUM OXIDE PO Take 400 mg by mouth daily.  ? meloxicam (MOBIC) 15 MG tablet Take 15 mg by mouth daily as needed.  ? metoprolol tartrate (LOPRESSOR) 50 MG tablet TAKE 1 TABLET BY MOUTH TWICE DAILY  ? Misc Natural Products (NEURIVA) CAPS Take 1 capsule by mouth daily.  ?  nystatin (NYSTATIN) powder Apply topically 3 (three) times daily. As needed  ? omeprazole (PRILOSEC) 20 MG capsule TAKE 1 CAPSULE BY MOUTH DAILY  ? OneTouch Delica Lancets 33G MISC USE AS DIRECTED TO CHECK BLOOD SUGAR THREE TIMES DAILY  ? PFIZER COVID-19 VAC BIVALENT injection   ? PFIZER-BIONT COVID-19 VAC-TRIS SUSP injection   ? prednisoLONE acetate (PRED FORTE) 1 % ophthalmic suspension Place 1 drop into the right eye 4 (four) times daily.  ? pregabalin (LYRICA) 50 MG capsule Take 1 capsule (50 mg total) by mouth at bedtime.  ? PREVNAR 20 0.5 ML injection   ? Psyllium (METAMUCIL FIBER PO) Take by mouth.  ? rosuvastatin (CRESTOR) 20 MG tablet TAKE 1 TABLET(20 MG) BY MOUTH DAILY  ? traMADol (ULTRAM) 50 MG tablet Take 1 tablet (50 mg total) by mouth every 6 (six) hours as needed.  ? traZODone (DESYREL) 50 MG tablet TAKE 1 TABLET BY MOUTH EVERY NIGHT AT BEDTIME  ? TRULICITY 1.5 MG/0.5ML SOPN ADMINISTER 1.5 MG UNDER THE SKIN 1 TIME A WEEK  ? Vitamin A 3 MG (10000 UT) TABS Take 1 tablet by mouth daily. 2,400 mcg  ? Vitamin D, Ergocalciferol, (DRISDOL) 1.25 MG (50000 UNIT) CAPS capsule TAKE 1 CAPSULE BY MOUTH EVERY 7 DAYS  ? vitamin E 600 UNIT capsule Take 400 Units by mouth daily.  ? ?No facility-administered  encounter medications on file as of 10/09/2021.  ? ?10-09-2021: Completed and uploaded reorder form for fiasp and tresiba. ? ?Huey Romans CMA ?Clinical Pharmacist Assistant ?[phone] ? ?

## 2021-10-10 ENCOUNTER — Encounter: Payer: Self-pay | Admitting: Internal Medicine

## 2021-10-14 ENCOUNTER — Ambulatory Visit (INDEPENDENT_AMBULATORY_CARE_PROVIDER_SITE_OTHER): Payer: Medicare Other | Admitting: Nurse Practitioner

## 2021-10-14 ENCOUNTER — Encounter: Payer: Self-pay | Admitting: Nurse Practitioner

## 2021-10-14 VITALS — BP 120/74 | HR 60 | Temp 97.9°F | Ht 63.4 in | Wt 177.0 lb

## 2021-10-14 DIAGNOSIS — N939 Abnormal uterine and vaginal bleeding, unspecified: Secondary | ICD-10-CM | POA: Diagnosis not present

## 2021-10-14 DIAGNOSIS — Z794 Long term (current) use of insulin: Secondary | ICD-10-CM | POA: Diagnosis not present

## 2021-10-14 DIAGNOSIS — E1165 Type 2 diabetes mellitus with hyperglycemia: Secondary | ICD-10-CM | POA: Diagnosis not present

## 2021-10-14 LAB — POCT URINALYSIS DIPSTICK
Bilirubin, UA: NEGATIVE
Blood, UA: NEGATIVE
Glucose, UA: POSITIVE — AB
Ketones, UA: NEGATIVE
Leukocytes, UA: NEGATIVE
Nitrite, UA: NEGATIVE
Protein, UA: NEGATIVE
Spec Grav, UA: 1.01 (ref 1.010–1.025)
Urobilinogen, UA: 0.2 E.U./dL
pH, UA: 6.5 (ref 5.0–8.0)

## 2021-10-14 NOTE — Progress Notes (Signed)
Clinical biochemist,Tianna Badgett,acting as a Neurosurgeonscribe for SUPERVALU INCJanece Dvontae Ruan, FNP.,have documented all relevant documentation on the behalf of Arnette FeltsJanece Dennison Mcdaid, FNP,as directed by  Arnette FeltsJanece Derriana Oser, FNP while in the presence of Arnette FeltsJanece Manjot Hinks, FNP. ? ?This visit occurred during the SARS-CoV-2 public health emergency.  Safety protocols were in place, including screening questions prior to the visit, additional usage of staff PPE, and extensive cleaning of exam room while observing appropriate contact time as indicated for disinfecting solutions. ? ?Subjective:  ?  ? Patient ID: [name] [name] , female    DOB: June 08, 1951 , 71 y.o.   MRN: [id number] ? ? ?Chief Complaint  ?Patient presents with  ? Vaginal Bleeding  ? ? ?HPI ? ?Patient presents for treatment for vaginal bleeding on Friday. When she wiped she seen blood. She then put on a pad which had a little bit of blood. She has not had a hysteretomy. She has an appt with GYN in June.  She has pain to bilateral lower abdomen areas. She feels like "something" is coming out of her vagina.  ? ?Vaginal Bleeding ?The patient's pertinent negatives include no genital itching. This is a new problem. The pain is mild. The problem affects both sides. She is not pregnant. Pertinent negatives include no abdominal pain. She is not sexually active. No, her partner does not have an STD. She uses nothing for contraception. She is postmenopausal.   ? ?Past Medical History:  ?Diagnosis Date  ? Anemia   ? Carpal tunnel syndrome   ? Coronary artery disease   ? DES to ostial RCA 2005  ? Diabetes mellitus without complication (HCC)   ? No longer taking meds  ? Dyslipidemia   ? Hypertension   ? Osteoarthritis   ? PAD (peripheral artery disease) (HCC)   ? Sleep apnea   ?  ? ?Family History  ?Problem Relation Age of Onset  ? CAD Father [id number]  ? Heart attack Father   ? CAD Mother [id number]  ? Heart attack Mother   ? CAD Brother   ? Cancer Brother   ?     prostate  ? CAD Sister   ? Cancer Sister   ?     breast  ? CAD Brother    ? ? ? ?Current Outpatient Medications:  ?  Accu-Chek FastClix Lancets MISC, CHECK BLOOD SUGAR THREE TIMES DAILY dx: e11.65, Disp: 100 each, Rfl: 2 ?  albuterol (VENTOLIN HFA) 108 (90 Base) MCG/ACT inhaler, Inhale 2 puffs into the lungs every 6 (six) hours as needed for wheezing or shortness of breath., Disp: 8 g, Rfl: 2 ?  amLODipine (NORVASC) 10 MG tablet, TAKE 1 TABLET(10 MG) BY MOUTH DAILY, Disp: 90 tablet, Rfl: 1 ?  Ascorbic Acid (VITAMIN C) 500 MG CHEW, 1 tablet, Disp: , Rfl:  ?  aspirin EC 81 MG tablet, Take 81 mg by mouth daily. Swallow whole., Disp: , Rfl:  ?  Cholecalciferol ([employer]S VIT D 5000 HIGH-POTENCY PO), Take 1 tablet by mouth daily., Disp: , Rfl:  ?  Coenzyme Q10 (COQ-10) 100 MG CAPS, Take 1 tablet by mouth daily. , Disp: , Rfl:  ?  Continuous Blood Gluc Receiver (FREESTYLE LIBRE 2 READER) DEVI, Use as directed to check blood sugars 3 times per day dx: e11.65, Disp: 1 each, Rfl: 1 ?  Cyanocobalamin (VITAMIN B-12 PO), Take 500 mcg by mouth., Disp: , Rfl:  ?  FARXIGA 10 MG TABS tablet, TAKE 1 TABLET(10 MG) BY MOUTH DAILY BEFORE AND BREAKFAST, Disp: 30 tablet, Rfl: 2 ?  FLUAD QUADRIVALENT 0.5 ML injection, , Disp: , Rfl:  ?  Glucagon (GVOKE HYPOPEN 2-PACK) 0.5 MG/0.1ML SOAJ, Use for blood sugars less than 60., Disp: 0.2 mL, Rfl: 3 ?  glucosamine-chondroitin 500-400 MG tablet, Take 1 tablet by mouth daily. Reported on 12/17/2015, Disp: , Rfl:  ?  glucose blood (ONETOUCH VERIO) test strip, CHECK BLOOD SUGAR THREE TIMES DAILY dx: e11.65, Disp: 150 each, Rfl: 11 ?  insulin lispro (HUMALOG KWIKPEN) 100 UNIT/ML KwikPen, Inject subcutaneously 2 units if sugars are 150 - 199, 4 units 200 -249, 6 units 250- 299, 8 units 300 - 349, 10 units >350, Disp: 15 mL, Rfl: 0 ?  Insulin Pen Needle (BD PEN NEEDLE NANO 2ND GEN) 32G X 4 MM MISC, USE TO INJECT INSULIN DAILY, Disp: 100 each, Rfl: 2 ?  MAGNESIUM OXIDE PO, Take 400 mg by mouth daily., Disp: , Rfl:  ?  meloxicam (MOBIC) 15 MG tablet, Take 15 mg by mouth daily as  needed., Disp: , Rfl:  ?  metoprolol tartrate (LOPRESSOR) 50 MG tablet, TAKE 1 TABLET BY MOUTH TWICE DAILY, Disp: 180 tablet, Rfl: 4 ?  Misc Natural Products (NEURIVA) CAPS, Take 1 capsule by mouth daily., Disp: , Rfl:  ?  nystatin (NYSTATIN) powder, Apply topically 3 (three) times daily. As needed, Disp: 60 g, Rfl: 0 ?  omeprazole (PRILOSEC) 20 MG capsule, TAKE 1 CAPSULE BY MOUTH DAILY, Disp: 90 capsule, Rfl: 1 ?  OneTouch Delica Lancets 33G MISC, USE AS DIRECTED TO CHECK BLOOD SUGAR THREE TIMES DAILY, Disp: 100 each, Rfl: 0 ?  PFIZER COVID-19 VAC BIVALENT injection, , Disp: , Rfl:  ?  PFIZER-BIONT COVID-19 VAC-TRIS SUSP injection, , Disp: , Rfl:  ?  prednisoLONE acetate (PRED FORTE) 1 % ophthalmic suspension, Place 1 drop into the right eye 4 (four) times daily., Disp: , Rfl:  ?  pregabalin (LYRICA) 50 MG capsule, Take 1 capsule (50 mg total) by mouth at bedtime., Disp: 90 capsule, Rfl: 1 ?  PREVNAR 20 0.5 ML injection, , Disp: , Rfl:  ?  Psyllium (METAMUCIL FIBER PO), Take by mouth., Disp: , Rfl:  ?  rosuvastatin (CRESTOR) 20 MG tablet, TAKE 1 TABLET(20 MG) BY MOUTH DAILY, Disp: 90 tablet, Rfl: 3 ?  traMADol (ULTRAM) 50 MG tablet, Take 1 tablet (50 mg total) by mouth every 6 (six) hours as needed., Disp: 20 tablet, Rfl: 0 ?  traZODone (DESYREL) 50 MG tablet, TAKE 1 TABLET BY MOUTH EVERY NIGHT AT BEDTIME, Disp: 90 tablet, Rfl: 1 ?  TRULICITY 1.5 MG/0.5ML SOPN, ADMINISTER 1.5 MG UNDER THE SKIN 1 TIME A WEEK, Disp: 6 mL, Rfl: 3 ?  Vitamin A 3 MG (10000 UT) TABS, Take 1 tablet by mouth daily. 2,400 mcg, Disp: , Rfl:  ?  Vitamin D, Ergocalciferol, (DRISDOL) 1.25 MG (50000 UNIT) CAPS capsule, TAKE 1 CAPSULE BY MOUTH EVERY 7 DAYS, Disp: 12 capsule, Rfl: 1 ?  vitamin E 600 UNIT capsule, Take 400 Units by mouth daily., Disp: , Rfl:   ? ?Allergies  ?Allergen Reactions  ? Fish Allergy   ?  Cod  ? Latex Rash  ?  ? ?Review of Systems  ?Constitutional: Negative.   ?Respiratory: Negative.    ?Cardiovascular: Negative.    ?Gastrointestinal: Negative.  Negative for abdominal pain.  ?Genitourinary:  Positive for vaginal bleeding.  ?Neurological: Negative.   ?Psychiatric/Behavioral: Negative.     ? ?Today's Vitals  ? 10/14/21 1036  ?BP: 120/74  ?Pulse: 60  ?Temp: 97.9 ?F (36.6 ?C)  ?TempSrc:  Oral  ?Weight: 177 lb (80.3 kg)  ?Height: 5' 3.4" (1.61 m)  ? ?Body mass index is 30.96 kg/m?.  ?Wt Readings from Last 3 Encounters:  ?10/14/21 177 lb (80.3 kg)  ?08/22/21 175 lb (79.4 kg)  ?07/04/21 175 lb 14.8 oz (79.8 kg)  ? ? ?Objective:  ?Physical Exam ?Vitals reviewed.  ?Constitutional:   ?   General: She is not in acute distress. ?   Appearance: Normal appearance.  ?Cardiovascular:  ?   Rate and Rhythm: Normal rate and regular rhythm.  ?   Pulses: Normal pulses.  ?   Heart sounds: Normal heart sounds. No murmur heard. ?Pulmonary:  ?   Effort: Pulmonary effort is normal. No respiratory distress.  ?   Breath sounds: Normal breath sounds. No wheezing.  ?Abdominal:  ?   General: Bowel sounds are normal. There is no distension.  ?   Palpations: Abdomen is soft.  ?   Tenderness: There is no abdominal tenderness.  ?   Hernia: No hernia is present.  ?   Comments: Bulging noted to mid upper abdomen. She has mild tenderness to left lower abdomen  ?Genitourinary: ?   Vagina: No vaginal discharge.  ?   Comments: Appears to have increased tissue to vaginal vault, soft ?Neurological:  ?   General: No focal deficit present.  ?   Mental Status: She is alert and oriented to person, place, and time.  ?   Cranial Nerves: No cranial nerve deficit.  ?   Motor: No weakness.  ?Psychiatric:     ?   Mood and Affect: Mood normal.     ?   Behavior: Behavior normal.     ?   Thought Content: Thought content normal.     ?   Judgment: Judgment normal.  ?  ? ?   ?Assessment And Plan:  ?   ?1. Vaginal bleeding ?Comments: No obvious bleeding to vaginal vault. She does look like she has a prolapsed uterus/bladder will try to get her in with GYN earlier ?- POCT Urinalysis  Dipstick (69629) ?  ? ? ?Patient was given opportunity to ask questions. Patient verbalized understanding of the plan and was able to repeat key elements of the plan. All questions were answered to their satisfaction.  ?Lolita Cram

## 2021-10-14 NOTE — Patient Instructions (Signed)
Abnormal Uterine Bleeding Abnormal uterine bleeding means bleeding more than normal from your womb (uterus). It can include: Bleeding after sex. Bleeding between monthly (menstrual) periods. Bleeding that is heavier than normal. Monthly periods that last longer than normal. Bleeding after you have stopped having your monthly period (menopause). You should see a doctor for any kind of bleeding that is not normal. Treatment depends on the cause of your bleeding and how much you bleed. Follow these instructions at home: Medicines Take over-the-counter and prescription medicines only as told by your doctor. Ask your doctor about: Taking medicines such as aspirin and ibuprofen. Do not take these medicines unless your doctor tells you to take them. Taking over-the-counter medicines, vitamins, herbs, and supplements. You may be given iron pills. Take them as told by your doctor. Managing constipation If you take iron pills, you may need to take these actions to prevent or treat trouble pooping (constipation): Drink enough fluid to keep your pee (urine) pale yellow. Take over-the-counter or prescription medicines. Eat foods that are high in fiber. These include beans, whole grains, and fresh fruits and vegetables. Limit foods that are high in fat and sugar. These include fried or sweet foods. Activity Change your activity to decrease bleeding if you need to change your sanitary pad more than one time every 2 hours: Lie in bed with your feet raised (elevated). Place a cold pack on your lower belly. Rest as much as you are able until the bleeding stops or slows down. General instructions Do not use tampons, douche, or have sex until your doctor says these things are okay. Change your pads often. Get regular exams. These include: Pelvic exams. Screenings for cancer of the cervix. It is up to you to get the results of any tests that are done. Ask how to get your results when they are  ready. Watch for any changes in your bleeding. For 2 months, write down: When your monthly period starts. When your monthly period ends. When you get any abnormal bleeding from your vagina. What problems you notice. Keep all follow-up visits. Contact a doctor if: The bleeding lasts more than one week. You feel dizzy at times. You feel like you may vomit (nausea). You vomit. You feel light-headed or weak. Your symptoms get worse. Get help right away if: You faint. You have to change pads every hour. You have pain in your belly. You have a fever or chills. You get sweaty or weak. You pass large blood clots from your vagina. These symptoms may be an emergency. Get help right away. Call your local emergency services (911 in the [place] Do not wait to see if the symptoms will go away. Do not drive yourself to the hospital. Summary Abnormal uterine bleeding means bleeding more than normal from your womb (uterus). Any kind of bleeding that is not normal should be checked by a doctor. Treatment depends on the cause of your bleeding and how much you bleed. Get help right away if you faint, you have to change pads every hour, or you pass large blood clots from your vagina. This information is not intended to replace advice given to you by your health care provider. Make sure you discuss any questions you have with your health care provider. Document Revised: 10/09/2020 Document Reviewed: 10/09/2020 Elsevier Patient Education  2023 Elsevier Inc.  

## 2021-10-16 ENCOUNTER — Telehealth: Payer: Self-pay

## 2021-10-16 NOTE — Chronic Care Management (AMB) (Signed)
? ? ?Chronic Care Management ?Pharmacy Assistant  ? ?Name: [name] [name]  MRN: [id number] DOB: May 30, 1951 ? ?Reason for Encounter: Patient Assistance Coordination ? ?10/16/2021- [employer] form being filled out for [employer]. Awaiting provider signature to fax. ? ?Received providers signature, faxed to [employer].   ? ?Medications: ?Outpatient Encounter Medications as of 10/16/2021  ?Medication Sig  ? Accu-Chek FastClix Lancets MISC CHECK BLOOD SUGAR THREE TIMES DAILY dx: e11.65  ? albuterol (VENTOLIN HFA) 108 (90 Base) MCG/ACT inhaler Inhale 2 puffs into the lungs every 6 (six) hours as needed for wheezing or shortness of breath.  ? amLODipine (NORVASC) 10 MG tablet TAKE 1 TABLET(10 MG) BY MOUTH DAILY  ? Ascorbic Acid (VITAMIN C) 500 MG CHEW 1 tablet  ? aspirin EC 81 MG tablet Take 81 mg by mouth daily. Swallow whole.  ? Cholecalciferol (CVS VIT D 5000 HIGH-POTENCY PO) Take 1 tablet by mouth daily.  ? Coenzyme Q10 (COQ-10) 100 MG CAPS Take 1 tablet by mouth daily.   ? Continuous Blood Gluc Receiver (FREESTYLE LIBRE 2 READER) DEVI Use as directed to check blood sugars 3 times per day dx: e11.65  ? Cyanocobalamin (VITAMIN B-12 PO) Take 500 mcg by mouth.  ? FARXIGA 10 MG TABS tablet TAKE 1 TABLET(10 MG) BY MOUTH DAILY BEFORE AND BREAKFAST  ? FLUAD QUADRIVALENT 0.5 ML injection   ? Glucagon (GVOKE HYPOPEN 2-PACK) 0.5 MG/0.1ML SOAJ Use for blood sugars less than 60.  ? glucosamine-chondroitin 500-400 MG tablet Take 1 tablet by mouth daily. Reported on 12/17/2015  ? glucose blood (ONETOUCH VERIO) test strip CHECK BLOOD SUGAR THREE TIMES DAILY dx: e11.65  ? insulin lispro (HUMALOG KWIKPEN) 100 UNIT/ML KwikPen Inject subcutaneously 2 units if sugars are 150 - 199, 4 units 200 -249, 6 units 250- 299, 8 units 300 - 349, 10 units >350  ? Insulin Pen Needle (BD PEN NEEDLE NANO 2ND GEN) 32G X 4 MM MISC USE TO INJECT INSULIN DAILY  ? MAGNESIUM OXIDE PO Take 400 mg by mouth daily.  ? meloxicam (MOBIC) 15 MG  tablet Take 15 mg by mouth daily as needed.  ? metoprolol tartrate (LOPRESSOR) 50 MG tablet TAKE 1 TABLET BY MOUTH TWICE DAILY  ? Misc Natural Products (NEURIVA) CAPS Take 1 capsule by mouth daily.  ? nystatin (NYSTATIN) powder Apply topically 3 (three) times daily. As needed  ? omeprazole (PRILOSEC) 20 MG capsule TAKE 1 CAPSULE BY MOUTH DAILY  ? OneTouch Delica Lancets 33G MISC USE AS DIRECTED TO CHECK BLOOD SUGAR THREE TIMES DAILY  ? PFIZER COVID-19 VAC BIVALENT injection   ? PFIZER-BIONT COVID-19 VAC-TRIS SUSP injection   ? prednisoLONE acetate (PRED FORTE) 1 % ophthalmic suspension Place 1 drop into the right eye 4 (four) times daily.  ? pregabalin (LYRICA) 50 MG capsule Take 1 capsule (50 mg total) by mouth at bedtime.  ? PREVNAR 20 0.5 ML injection   ? Psyllium (METAMUCIL FIBER PO) Take by mouth.  ? rosuvastatin (CRESTOR) 20 MG tablet TAKE 1 TABLET(20 MG) BY MOUTH DAILY  ? traMADol (ULTRAM) 50 MG tablet Take 1 tablet (50 mg total) by mouth every 6 (six) hours as needed.  ? traZODone (DESYREL) 50 MG tablet TAKE 1 TABLET BY MOUTH EVERY NIGHT AT BEDTIME  ? TRULICITY 1.5 MG/0.5ML SOPN ADMINISTER 1.5 MG UNDER THE SKIN 1 TIME A WEEK  ? Vitamin A 3 MG (10000 UT) TABS Take 1 tablet by mouth daily. 2,400 mcg  ? Vitamin D, Ergocalciferol, (DRISDOL) 1.25 MG (50000 UNIT) CAPS  capsule TAKE 1 CAPSULE BY MOUTH EVERY 7 DAYS  ? vitamin E 600 UNIT capsule Take 400 Units by mouth daily.  ? ?No facility-administered encounter medications on file as of 10/16/2021.  ? ?Billee Cashing, CMA ?Clinical Pharmacist Assistant ?[phone] ? ?

## 2021-10-17 ENCOUNTER — Encounter: Payer: Self-pay | Admitting: Obstetrics and Gynecology

## 2021-10-17 ENCOUNTER — Ambulatory Visit (INDEPENDENT_AMBULATORY_CARE_PROVIDER_SITE_OTHER): Payer: Medicare Other | Admitting: Obstetrics and Gynecology

## 2021-10-17 DIAGNOSIS — N8189 Other female genital prolapse: Secondary | ICD-10-CM | POA: Insufficient documentation

## 2021-10-17 NOTE — Progress Notes (Signed)
Pt states pain comes & goes. ? ?Ct on 06/03/2021; ? ?Reproductive: Uterus atrophic. Small left ovarian cyst, recently ?characterized as benign on dedicated pelvic ultrasound 05/30/2021. ?Adnexa regions are otherwise within normal limits. ? ?Other: No free fluid. No abdominopelvic fluid collection. No ?pneumoperitoneum. Small fat containing umbilical and supraumbilical ?hernias. ?

## 2021-10-17 NOTE — Progress Notes (Signed)
[name] [name] presents for evaluation of pelvic relaxation. ?Pt has note a "bulge" in her vaginal for 6-8 months now. ?She feels some pelvic pressure and pain at times. She has not attempted to replaced or reduce "bulge". ?She is not sexual active.  ?She denies any urinary incont Sx, no accidents. No bowel problems either. ?Voids 5-6 times during the day. Once during the night ?1 cup of coffee each morning ?She does stand on her feet a lot during the day. ? ?TSVD x 2. No forceps or extensive lacerations as best she can recall. ? ? ?PE AF VSS ?Chaperone present during exam ? ?Lungs clear Heart RRR ?Abd soft + BS ?GU Nl EGBUS, vaginal atrophy noted, cystocele to hymnal ring with Valsalva, cervix without lesions, uterus, small, mobile, no masses. Small rectocele. Levator ani muscles attenuated bilaterally. PB < 1 cm. Rectal exam good tone and confirms above. ? ? ?A/P Pelvic relaxation ? ?Reviewed Dx with pt. Potential tx options reviewed. Will refer to UroGyn for further evaluation and treatment. F/U with [place] PRN ? ? ? ?

## 2021-10-17 NOTE — Patient Instructions (Signed)
Pelvic Organ Prolapse Pelvic organ prolapse is a condition in women that involves the stretching, bulging, or dropping of pelvic organs into an abnormal position, past the opening of the vagina. It happens when the muscles and tissues that surround and support pelvic structures become weak or stretched. Pelvic organ prolapse can involve the: Vagina (vaginal prolapse). Uterus (uterine prolapse). Bladder (cystocele). Rectum (rectocele). Intestines (enterocele). When organs other than the vagina are involved, they often bulge into the vagina or protrude from the vagina, depending on how severe the prolapse is. What are the causes? This condition may be caused by: Pregnancy, labor, and childbirth. Past pelvic surgery. Lower levels of the hormone estrogen due to menopause. Consistently lifting more than 50 lb (23 kg). Obesity. Long-term difficulty passing stool (chronic constipation). Long-term, or chronic, cough. Fluid buildup in the abdomen due to certain conditions. What are the signs or symptoms? Symptoms of this condition include: Leaking a little urine (loss of bladder control) when you cough, sneeze, strain, and exercise (stress incontinence). This may be worse immediately after childbirth. It may gradually improve over time. Feeling pressure in your pelvis or vagina. This pressure may increase when you cough or when you are passing stool. A bulge that protrudes from the opening of your vagina. Difficulty passing urine or stool. Pain in your lower back. Pain or discomfort during sex, or decreased interest in sex. Repeated bladder infections (urinary tract infections). Difficulty inserting a tampon. In some people, this condition causes no symptoms. How is this diagnosed? This condition may be diagnosed based on a vaginal and rectal exam. During the exam, you may be asked to cough and strain while you are lying down, sitting, and standing up. Your health care provider will determine if  other tests are required, such as bladder function tests. How is this treated? Treatment for this condition may depend on your symptoms. Treatment may include: Lifestyle changes, such as drinking plenty of fluids and eating foods that are high in fiber. Emptying your bladder at scheduled times (bladder training therapy). This can help reduce or avoid urinary incontinence. Estrogen. This may help mild prolapse by increasing the strength and tone of pelvic floor muscles. Kegel exercises. These may help mild cases of prolapse by strengthening and tightening the muscles of the pelvic floor. A soft, flexible device that helps support the vaginal walls and keep pelvic organs in place (pessary). This is inserted into your vagina by your health care provider. Surgery. This is often the only form of treatment for severe prolapse. Follow these instructions at home: Eating and drinking Avoid drinking beverages that contain caffeine or alcohol. Increase your intake of high-fiber foods to decrease constipation and straining during bowel movements. Activity Lose weight if recommended by your health care provider. Avoid heavy lifting and straining with exercise and work. Do not hold your breath when you perform mild to moderate lifting and exercise activities. Limit your activities as directed by your health care provider. Do Kegel exercises as directed by your health care provider. To do this: Squeeze your pelvic floor muscles tight. You should feel a tight lift in your rectal area and a tightness in your vaginal area. Keep your stomach, buttocks, and legs relaxed. Hold the muscles tight for up to 10 seconds. Then relax your muscles. Repeat this exercise 50 times a day, or as much as told by your health care provider. Continue to do this exercise for at least 4-6 weeks, or for as long as told by your health care provider.   General instructions Take over-the-counter and prescription medicines only as told by  your health care provider. Wear a sanitary pad or adult diapers if you have urinary incontinence. If you have a pessary, take care of it as told by your health care provider. Keep all follow-up visits. This is important. Contact a health care provider if you: Have symptoms that interfere with your daily activities or sex life. Need medicine to help with the discomfort. Notice bleeding from your vagina that is not related to your menstrual period. Have a fever. Have pain or bleeding when you urinate. Have bleeding when you pass stool. Pass urine when you have sex. Have chronic constipation. Have a pessary that falls out. Have a foul-smelling vaginal discharge. Have an unusual, low pain in your abdomen. Get help right away if you: Cannot pass urine. Summary Pelvic organ prolapse is the stretching, bulging, or dropping of pelvic organs into an abnormal position. It happens when the muscles and tissues that surround and support pelvic structures become weak or stretched. When organs other than the vagina are involved, they often bulge into the vagina or protrude from it, depending on how severe the prolapse is. In most cases, this condition needs to be treated only if it produces symptoms. Treatment may include lifestyle changes, estrogen, Kegel exercises, pessary insertion, or surgery. Avoid heavy lifting and straining with exercise and work. Do not hold your breath when you perform mild to moderate lifting and exercise activities. Limit your activities as directed by your health care provider. This information is not intended to replace advice given to you by your health care provider. Make sure you discuss any questions you have with your health care provider. Document Revised: 12/05/2019 Document Reviewed: 12/05/2019 Elsevier Patient Education  2023 Elsevier Inc.  

## 2021-10-23 ENCOUNTER — Telehealth: Payer: Self-pay

## 2021-10-23 NOTE — Chronic Care Management (AMB) (Addendum)
Faxed received from [employer] patient assistance program [name] will be filled on 10/15/21 and will arrive to the office in 10-14 business days. Patient has 1 refill remaining and enrollment will expire on 05/22/2022. ? ?Next refill for patient will be fulfilled on 01/03/2022. ? ?Billee Cashing, CMA ?Clinical Pharmacist Assistant ?[phone] ? ?I have reviewed the care management and care coordination activities outlined in this encounter and I am certifying that I agree with the content of this note. No further action required. ? ?Harlan Stains, RPH ?11/06/21 1:49 PM ? ?

## 2021-10-25 DIAGNOSIS — K5900 Constipation, unspecified: Secondary | ICD-10-CM | POA: Diagnosis not present

## 2021-11-09 ENCOUNTER — Other Ambulatory Visit: Payer: Self-pay | Admitting: Internal Medicine

## 2021-11-11 DIAGNOSIS — H905 Unspecified sensorineural hearing loss: Secondary | ICD-10-CM | POA: Diagnosis not present

## 2021-11-13 DIAGNOSIS — Z794 Long term (current) use of insulin: Secondary | ICD-10-CM | POA: Diagnosis not present

## 2021-11-13 DIAGNOSIS — E1165 Type 2 diabetes mellitus with hyperglycemia: Secondary | ICD-10-CM | POA: Diagnosis not present

## 2021-11-14 ENCOUNTER — Encounter: Payer: Self-pay | Admitting: Internal Medicine

## 2021-11-15 ENCOUNTER — Telehealth: Payer: Self-pay | Admitting: Nurse Practitioner

## 2021-11-15 ENCOUNTER — Encounter: Payer: Self-pay | Admitting: Nurse Practitioner

## 2021-11-15 NOTE — Telephone Encounter (Signed)
Spoke with patient about her blood sugar has been dropping as low as 61 at night since Monday mostly when sleeping using the [employer].  She checked with her regular glucometer which read 110's. She does not have any symptoms and is still able to do her regular activity. She has taken GVOKE earlier in the week. She also recalls receiving a message about faulty libre sensors but unsure of which one. Advised her to change her sensor now and see how readings are. If remains low with the regular glucometer then she would need to take Gvoke.

## 2021-11-19 ENCOUNTER — Other Ambulatory Visit: Payer: Self-pay | Admitting: Internal Medicine

## 2021-11-19 ENCOUNTER — Ambulatory Visit (INDEPENDENT_AMBULATORY_CARE_PROVIDER_SITE_OTHER): Payer: Medicare Other | Admitting: Internal Medicine

## 2021-11-19 ENCOUNTER — Encounter: Payer: Self-pay | Admitting: Internal Medicine

## 2021-11-19 VITALS — BP 112/70 | HR 64 | Temp 97.7°F | Ht 63.4 in | Wt 174.4 lb

## 2021-11-19 DIAGNOSIS — Z6830 Body mass index (BMI) 30.0-30.9, adult: Secondary | ICD-10-CM

## 2021-11-19 DIAGNOSIS — Z794 Long term (current) use of insulin: Secondary | ICD-10-CM

## 2021-11-19 DIAGNOSIS — N182 Chronic kidney disease, stage 2 (mild): Secondary | ICD-10-CM | POA: Diagnosis not present

## 2021-11-19 DIAGNOSIS — E1122 Type 2 diabetes mellitus with diabetic chronic kidney disease: Secondary | ICD-10-CM

## 2021-11-19 DIAGNOSIS — N819 Female genital prolapse, unspecified: Secondary | ICD-10-CM

## 2021-11-19 DIAGNOSIS — E6609 Other obesity due to excess calories: Secondary | ICD-10-CM

## 2021-11-19 DIAGNOSIS — I129 Hypertensive chronic kidney disease with stage 1 through stage 4 chronic kidney disease, or unspecified chronic kidney disease: Secondary | ICD-10-CM | POA: Diagnosis not present

## 2021-11-19 NOTE — Progress Notes (Signed)
[name] [name],acting as a Education administrator for [name] Greenland, MD.,have documented all relevant documentation on the behalf of [name] Greenland, MD,as directed by  [name] Greenland, MD while in the presence of [name] Greenland, MD.  This visit occurred during the SARS-CoV-2 public health emergency.  Safety protocols were in place, including screening questions prior to the visit, additional usage of staff PPE, and extensive cleaning of exam room while observing appropriate contact time as indicated for disinfecting solutions.  Subjective:     Patient ID: [name] [name] , female    DOB: 10/11/1950 , 71 y.o.   MRN: [id number]   Chief Complaint  Patient presents with   Diabetes    HPI  Patient presents today for a diabetes. She initially thought she was having episodes of hypoglycemia. However, she started to check her sugars with fingerprick, instead of going by the [name] and realized her sugars were ok. She may have been using an expired sensor.   She stopped the [place] over the weekend because she contacted the on call MD worried about her low sugars. Her sugars have improved since changing out the [name]. She also reported she went to the [name] center for evaluation of pelvic prolapse.   Diabetes She presents for her follow-up diabetic visit. She has type 2 diabetes mellitus. Her disease course has been improving. Pertinent negatives for hypoglycemia include no dizziness or headaches. Pertinent negatives for diabetes include no blurred vision, no chest pain, no fatigue, no polydipsia, no polyphagia and no polyuria. There are no hypoglycemic complications. Risk factors for coronary artery disease include diabetes mellitus, dyslipidemia, hypertension and post-menopausal. Current diabetic treatment includes insulin injections. She is compliant with treatment most of the time. She is following a diabetic diet. She participates in exercise intermittently. Eye exam is current.   Hypertension This is a chronic problem. The current episode started more than 1 year ago. The problem has been gradually improving since onset. The problem is controlled. Pertinent negatives include no blurred vision, chest pain, headaches or palpitations. Past treatments include angiotensin blockers. The current treatment provides moderate improvement. Compliance problems include exercise and diet.  Hypertensive end-organ damage includes kidney disease. Identifiable causes of hypertension include chronic renal disease.     Past Medical History:  Diagnosis Date   Anemia    Carpal tunnel syndrome    Coronary artery disease    DES to ostial RCA 2005   Diabetes mellitus without complication (HCC)    No longer taking meds   Dyslipidemia    Hypertension    Osteoarthritis    PAD (peripheral artery disease) (HCC)    Sleep apnea      Family History  Problem Relation Age of Onset   CAD Father 34   Heart attack Father    CAD Mother 61   Heart attack Mother    CAD Brother    Cancer Brother        prostate   CAD Sister    Cancer Sister        breast   CAD Brother      Current Outpatient Medications:    Accu-Chek FastClix Lancets MISC, CHECK BLOOD SUGAR THREE TIMES DAILY dx: e11.65, Disp: 100 each, Rfl: 2   amLODipine (NORVASC) 10 MG tablet, TAKE 1 TABLET(10 MG) BY MOUTH DAILY, Disp: 90 tablet, Rfl: 1   Ascorbic Acid (VITAMIN C) 500 MG CHEW, 1 tablet, Disp: , Rfl:    aspirin EC 81 MG tablet, Take 81 mg by  mouth daily. Swallow whole., Disp: , Rfl:    Cholecalciferol (CVS VIT D 5000 HIGH-POTENCY PO), Take 1 tablet by mouth daily., Disp: , Rfl:    Coenzyme Q10 (COQ-10) 100 MG CAPS, Take 1 tablet by mouth daily. , Disp: , Rfl:    Continuous Blood Gluc Receiver (FREESTYLE LIBRE 2 READER) DEVI, Use as directed to check blood sugars 3 times per day dx: e11.65, Disp: 1 each, Rfl: 1   Cyanocobalamin (VITAMIN B-12 PO), Take 500 mcg by mouth., Disp: , Rfl:    FLUAD QUADRIVALENT 0.5 ML  injection, , Disp: , Rfl:    Glucagon (GVOKE HYPOPEN 2-PACK) 0.5 MG/0.1ML SOAJ, Use for blood sugars less than 60., Disp: 0.2 mL, Rfl: 3   glucosamine-chondroitin 500-400 MG tablet, Take 1 tablet by mouth daily. Reported on 12/17/2015, Disp: , Rfl:    glucose blood (ONETOUCH VERIO) test strip, CHECK BLOOD SUGAR THREE TIMES DAILY dx: e11.65, Disp: 150 each, Rfl: 11   insulin lispro (HUMALOG KWIKPEN) 100 UNIT/ML KwikPen, Inject subcutaneously 2 units if sugars are 150 - 199, 4 units 200 -249, 6 units 250- 299, 8 units 300 - 349, 10 units >350, Disp: 15 mL, Rfl: 0   Insulin Pen Needle (BD PEN NEEDLE NANO 2ND GEN) 32G X 4 MM MISC, USE TO INJECT INSULIN DAILY, Disp: 100 each, Rfl: 2   MAGNESIUM OXIDE PO, Take 400 mg by mouth daily., Disp: , Rfl:    meloxicam (MOBIC) 15 MG tablet, Take 15 mg by mouth daily as needed., Disp: , Rfl:    metoprolol tartrate (LOPRESSOR) 50 MG tablet, TAKE 1 TABLET BY MOUTH TWICE DAILY, Disp: 180 tablet, Rfl: 4   Misc Natural Products (NEURIVA) CAPS, Take 1 capsule by mouth daily., Disp: , Rfl:    nystatin (NYSTATIN) powder, Apply topically 3 (three) times daily. As needed, Disp: 60 g, Rfl: 0   omeprazole (PRILOSEC) 20 MG capsule, TAKE 1 CAPSULE BY MOUTH DAILY, Disp: 90 capsule, Rfl: 1   OneTouch Delica Lancets 99991111 MISC, USE AS DIRECTED TO CHECK BLOOD SUGAR THREE TIMES DAILY, Disp: 100 each, Rfl: 0   prednisoLONE acetate (PRED FORTE) 1 % ophthalmic suspension, Place 1 drop into the right eye 4 (four) times daily., Disp: , Rfl:    pregabalin (LYRICA) 50 MG capsule, TAKE 1 CAPSULE(50 MG) BY MOUTH AT BEDTIME, Disp: 90 capsule, Rfl: 1   PREVNAR 20 0.5 ML injection, , Disp: , Rfl:    Psyllium (METAMUCIL FIBER PO), Take by mouth., Disp: , Rfl:    rosuvastatin (CRESTOR) 20 MG tablet, TAKE 1 TABLET(20 MG) BY MOUTH DAILY, Disp: 90 tablet, Rfl: 3   traMADol (ULTRAM) 50 MG tablet, Take 1 tablet (50 mg total) by mouth every 6 (six) hours as needed., Disp: 20 tablet, Rfl: 0   traZODone  (DESYREL) 50 MG tablet, TAKE 1 TABLET BY MOUTH EVERY NIGHT AT BEDTIME, Disp: 90 tablet, Rfl: 1   TRULICITY 1.5 0000000 SOPN, ADMINISTER 1.5 MG UNDER THE SKIN 1 TIME A WEEK, Disp: 6 mL, Rfl: 3   Vitamin A 3 MG (10000 UT) TABS, Take 1 tablet by mouth daily. 2,400 mcg, Disp: , Rfl:    Vitamin D, Ergocalciferol, (DRISDOL) 1.25 MG (50000 UNIT) CAPS capsule, TAKE 1 CAPSULE BY MOUTH EVERY 7 DAYS, Disp: 12 capsule, Rfl: 1   vitamin E 600 UNIT capsule, Take 400 Units by mouth daily., Disp: , Rfl:    FARXIGA 10 MG TABS tablet, TAKE 1 TABLET(10 MG) BY MOUTH DAILY BEFORE AND BREAKFAST, Disp: 30 tablet,  Rfl: 1   Allergies  Allergen Reactions   Fish Allergy     Cod   Latex Rash     Review of Systems  Constitutional: Negative.  Negative for fatigue.  Eyes:  Negative for blurred vision.  Respiratory: Negative.    Cardiovascular: Negative.  Negative for chest pain and palpitations.  Gastrointestinal: Negative.   Endocrine: Negative for polydipsia, polyphagia and polyuria.  Genitourinary:        She is concerned about pelvic prolapse. Has been seen by GYN, referred to Urogyn, but she has not heard about appt yet.   Neurological: Negative.  Negative for dizziness and headaches.  Psychiatric/Behavioral: Negative.       Today's Vitals   11/19/21 1124  BP: 112/70  Pulse: 64  Temp: 97.7 F (36.5 C)  SpO2: 97%  Weight: 174 lb 6.4 oz (79.1 kg)  Height: 5' 3.4" (1.61 m)  PainSc: 0-No pain   Body mass index is 30.5 kg/m.  Wt Readings from Last 3 Encounters:  11/19/21 174 lb 6.4 oz (79.1 kg)  10/17/21 175 lb 4.8 oz (79.5 kg)  10/14/21 177 lb (80.3 kg)     Objective:  Physical Exam Vitals and nursing note reviewed.  Constitutional:      Appearance: Normal appearance.  HENT:     Head: Normocephalic and atraumatic.  Eyes:     Extraocular Movements: Extraocular movements intact.  Cardiovascular:     Rate and Rhythm: Normal rate and regular rhythm.     Heart sounds: Normal heart sounds.   Pulmonary:     Effort: Pulmonary effort is normal.     Breath sounds: Normal breath sounds.  Musculoskeletal:     Cervical back: Normal range of motion.  Skin:    General: Skin is warm.  Neurological:     General: No focal deficit present.     Mental Status: She is alert.  Psychiatric:        Mood and Affect: Mood normal.        Behavior: Behavior normal.         Assessment And Plan:     1. Type 2 diabetes mellitus with stage 2 chronic kidney disease, with long-term current use of insulin ([place]) Comments: Chronic, she is encouraged to resume [place]. She is reminded of cardiac/renal protective benefits. Last a1c 7.22 August 2021.  She will f/u July 2023.   2. Parenchymal renal hypertension, stage 1 through stage 4 or unspecified chronic kidney disease Comments: Chronic, well controlled. She is encouraged to follow low sodium diet.   3. Female genital prolapse, unspecified type Comments: GYN input appreciated. She is awaiting Urogyn evaluation. I will have referral coordinator f/u on this referral for her.   4. Class 1 obesity due to excess calories with serious comorbidity and body mass index (BMI) of 30.0 to 30.9 in adult  She is encouraged to strive for BMI less than 30 to decrease cardiac risk. Advised to aim for at least 150 minutes of exercise per week.   Patient was given opportunity to ask questions. Patient verbalized understanding of the plan and was able to repeat key elements of the plan. All questions were answered to their satisfaction.   I, [name] Greenland, MD, have reviewed all documentation for this visit. The documentation on 11/19/21 for the exam, diagnosis, procedures, and orders are all accurate and complete.   IF YOU HAVE BEEN REFERRED TO A SPECIALIST, IT MAY TAKE 1-2 WEEKS TO SCHEDULE/PROCESS THE REFERRAL. IF YOU HAVE NOT HEARD  FROM US/SPECIALIST IN TWO WEEKS, PLEASE GIVE [place] A CALL AT [phone].   THE PATIENT IS ENCOURAGED TO PRACTICE SOCIAL  DISTANCING DUE TO THE COVID-19 PANDEMIC.

## 2021-11-19 NOTE — Patient Instructions (Signed)

## 2021-11-25 DIAGNOSIS — Z20822 Contact with and (suspected) exposure to covid-19: Secondary | ICD-10-CM | POA: Diagnosis not present

## 2021-11-27 ENCOUNTER — Ambulatory Visit: Payer: Medicare Other | Admitting: Internal Medicine

## 2021-11-28 ENCOUNTER — Encounter: Payer: Medicare Other | Admitting: Obstetrics & Gynecology

## 2021-11-28 ENCOUNTER — Other Ambulatory Visit: Payer: Self-pay | Admitting: Internal Medicine

## 2021-12-11 ENCOUNTER — Ambulatory Visit (INDEPENDENT_AMBULATORY_CARE_PROVIDER_SITE_OTHER): Payer: Medicare Other | Admitting: Podiatry

## 2021-12-11 ENCOUNTER — Encounter: Payer: Self-pay | Admitting: Podiatry

## 2021-12-11 DIAGNOSIS — L84 Corns and callosities: Secondary | ICD-10-CM

## 2021-12-11 DIAGNOSIS — E119 Type 2 diabetes mellitus without complications: Secondary | ICD-10-CM

## 2021-12-16 DIAGNOSIS — K5900 Constipation, unspecified: Secondary | ICD-10-CM | POA: Insufficient documentation

## 2021-12-18 DIAGNOSIS — E1165 Type 2 diabetes mellitus with hyperglycemia: Secondary | ICD-10-CM | POA: Diagnosis not present

## 2021-12-18 DIAGNOSIS — Z794 Long term (current) use of insulin: Secondary | ICD-10-CM | POA: Diagnosis not present

## 2021-12-20 ENCOUNTER — Other Ambulatory Visit: Payer: Self-pay

## 2021-12-20 MED ORDER — FREESTYLE LIBRE 14 DAY SENSOR MISC: 2 refills | Status: DC

## 2021-12-23 ENCOUNTER — Telehealth: Payer: Self-pay

## 2021-12-23 NOTE — Chronic Care Management (AMB) (Signed)
Novo Nordisk patient assistance program notification:  120- day supply of [name] Flextouch will be filled on 01/10/2022 and should arrive to the office in 10-14 business days.   Billee Cashing, CMA Clinical Pharmacist Assistant [phone]

## 2021-12-30 ENCOUNTER — Other Ambulatory Visit: Payer: Self-pay | Admitting: Internal Medicine

## 2021-12-30 ENCOUNTER — Telehealth: Payer: Self-pay

## 2021-12-30 DIAGNOSIS — Z1231 Encounter for screening mammogram for malignant neoplasm of breast: Secondary | ICD-10-CM

## 2021-12-30 NOTE — Chronic Care Management (AMB) (Signed)
    Called [name], No answer, left message of appointment on 01-01-2022 at 9:00 via telephone visit with Cherylin Mylar, Pharm D. Notified to have all medications, supplements, blood pressure and/or blood sugar logs available during appointment and to return call if need to reschedule.   Huey Romans [facility] Clinical Pharmacist Assistant [phone]

## 2022-01-01 ENCOUNTER — Telehealth: Payer: Self-pay

## 2022-01-01 ENCOUNTER — Telehealth: Payer: Medicare Other

## 2022-01-01 NOTE — Chronic Care Management (AMB) (Signed)
Chronic Care Management Pharmacy Assistant   Name: [name] [name]  MRN: [id number] DOB: Feb 14, 1951   Reason for Encounter: Disease State/ Diabetes  Recent office visits:  11-19-2021 Glendale Chard, MD. STOP Albuterol inhaler.  10-14-2021 Minette Brine, [place]. Abnormal UA.  08-22-2021 Glendale Chard, MD. BUN/Creatinine Ratio= 11. A1C= 7.2. Referral placed to gastro.  Recent consult visits:  12-11-2021 Marzetta Board, DPM (Podiatry). Callus(es) plantarlateral aspect of midfoot right foot pared utilizing sterile scalpel blade without complication or incident. Total number debrided =1.  10-17-2021 Chancy Milroy, MD (OBGYN). Visit for pelvic relaxation. Referral placed to urogynecology.  09-05-2021 Landis Martins, DPM (Podiatry). Mechanically debrided nails x10 using a sterile nail nipper without incident -Mechanically debrided keratosis sub met 4 and plantar lateral foot on right foot using 15 blade without incident.  08-01-2021 Vira Agar, MD (Urology). Unable to view encounter.  Hospital visits:  None in previous 6 months  Medications: Outpatient Encounter Medications as of 01/01/2022  Medication Sig   metoprolol tartrate (LOPRESSOR) 50 MG tablet TAKE 1 TABLET BY MOUTH TWICE DAILY   Accu-Chek FastClix Lancets MISC CHECK BLOOD SUGAR THREE TIMES DAILY dx: e11.65   amLODipine (NORVASC) 10 MG tablet TAKE 1 TABLET(10 MG) BY MOUTH DAILY   Ascorbic Acid (VITAMIN C) 500 MG CHEW 1 tablet   aspirin EC 81 MG tablet Take 81 mg by mouth daily. Swallow whole.   Cholecalciferol (CVS VIT D 5000 HIGH-POTENCY PO) Take 1 tablet by mouth daily.   Coenzyme Q10 (COQ-10) 100 MG CAPS Take 1 tablet by mouth daily.    Continuous Blood Gluc Receiver (FREESTYLE LIBRE 2 READER) DEVI Use as directed to check blood sugars 3 times per day dx: e11.65   Continuous Blood Gluc Sensor (FREESTYLE LIBRE 14 DAY SENSOR) MISC Use as directed to check blood sugars 3 times per day dx: e11.65    Cyanocobalamin (VITAMIN B-12 PO) Take 500 mcg by mouth.   FARXIGA 10 MG TABS tablet TAKE 1 TABLET(10 MG) BY MOUTH DAILY BEFORE AND BREAKFAST   FLUAD QUADRIVALENT 0.5 ML injection    Glucagon (GVOKE HYPOPEN 2-PACK) 0.5 MG/0.1ML SOAJ Use for blood sugars less than 60.   glucosamine-chondroitin 500-400 MG tablet Take 1 tablet by mouth daily. Reported on 12/17/2015   glucose blood (ONETOUCH VERIO) test strip CHECK BLOOD SUGAR THREE TIMES DAILY dx: e11.65   insulin lispro (HUMALOG KWIKPEN) 100 UNIT/ML KwikPen Inject subcutaneously 2 units if sugars are 150 - 199, 4 units 200 -249, 6 units 250- 299, 8 units 300 - 349, 10 units >350   Insulin Pen Needle (BD PEN NEEDLE NANO 2ND GEN) 32G X 4 MM MISC USE TO INJECT INSULIN DAILY   MAGNESIUM OXIDE PO Take 400 mg by mouth daily.   meloxicam (MOBIC) 15 MG tablet Take 15 mg by mouth daily as needed.   Misc Natural Products (NEURIVA) CAPS Take 1 capsule by mouth daily.   nystatin (NYSTATIN) powder Apply topically 3 (three) times daily. As needed   omeprazole (PRILOSEC) 20 MG capsule TAKE 1 CAPSULE BY MOUTH DAILY   OneTouch Delica Lancets 15Q MISC USE AS DIRECTED TO CHECK BLOOD SUGAR THREE TIMES DAILY   prednisoLONE acetate (PRED FORTE) 1 % ophthalmic suspension Place 1 drop into the right eye 4 (four) times daily.   pregabalin (LYRICA) 50 MG capsule TAKE 1 CAPSULE(50 MG) BY MOUTH AT BEDTIME   PREVNAR 20 0.5 ML injection    Psyllium (METAMUCIL FIBER PO) Take by mouth.   rosuvastatin (CRESTOR) 20 MG tablet  TAKE 1 TABLET(20 MG) BY MOUTH DAILY   traMADol (ULTRAM) 50 MG tablet Take 1 tablet (50 mg total) by mouth every 6 (six) hours as needed.   traZODone (DESYREL) 50 MG tablet TAKE 1 TABLET BY MOUTH EVERY NIGHT AT BEDTIME   TRULICITY 1.5 BT/5.9RC SOPN ADMINISTER 1.5 MG UNDER THE SKIN 1 TIME A WEEK   Vitamin A 3 MG (10000 UT) TABS Take 1 tablet by mouth daily. 2,400 mcg   Vitamin D, Ergocalciferol, (DRISDOL) 1.25 MG (50000 UNIT) CAPS capsule TAKE 1 CAPSULE BY MOUTH  EVERY 7 DAYS   vitamin E 600 UNIT capsule Take 400 Units by mouth daily.   No facility-administered encounter medications on file as of 01/01/2022.  Recent Relevant Labs: Lab Results  Component Value Date/Time   HGBA1C 7.2 (H) 08/22/2021 10:16 AM   HGBA1C 7.3 (H) 04/10/2021 03:37 PM   MICROALBUR 30 04/10/2021 05:02 PM   MICROALBUR 30 03/21/2021 10:30 AM    Kidney Function Lab Results  Component Value Date/Time   CREATININE 0.73 08/22/2021 10:16 AM   CREATININE 0.77 06/03/2021 12:33 PM   GFRNONAA >60 06/03/2021 12:33 PM   GFRAA 87 07/04/2020 02:36 PM    Current antihyperglycemic regimen:  Farxiga 10 mg daily  Trulicity 1.5 weekly Humalog sliding scale  What recent interventions/DTPs have been made to improve glycemic control:  Educated on A1c and blood sugar goals; Continuous glucose monitoring; -Counseled to check feet daily and get yearly eye exams -Recommended to continue current medication  Have there been any recent hospitalizations or ED visits since last visit with CPP? No  Patient denies hypoglycemic symptoms  Patient denies hyperglycemic symptoms  How often are you checking your blood sugar? twice daily  What are your blood sugars ranging?  Fasting: 128, 105 Before meals: None After meals: 150, 150 Bedtime: None  During the week, how often does your blood glucose drop below 70? Never  Are you checking your feet daily/regularly? Daily  Adherence Review: Is the patient currently on a STATIN medication? Yes Is the patient currently on ACE/ARB medication? No Does the patient have >5 day gap between last estimated fill dates? No  Care Gaps: Covid booster overdue Yearly ophthalmology overdue  Star Rating Drugs: Farxiga 10 mg- Last filled 12-29-2021 30 DS [facility] Trulicity 1.5 mg- Last filled 11-10-2021 84 DS [facility] Rosuvastatin 20 mg- Last filled 10-01-2021 90 DS [place] Clinical Pharmacist Assistant [phone]

## 2022-01-02 ENCOUNTER — Ambulatory Visit
Admission: RE | Admit: 2022-01-02 | Discharge: 2022-01-02 | Disposition: A | Discharge status: Home / Self Care | Payer: Medicare Other | Source: Ambulatory Visit | Attending: Internal Medicine | Admitting: Internal Medicine

## 2022-01-02 DIAGNOSIS — Z1231 Encounter for screening mammogram for malignant neoplasm of breast: Secondary | ICD-10-CM

## 2022-01-03 ENCOUNTER — Other Ambulatory Visit: Payer: Self-pay | Admitting: Internal Medicine

## 2022-01-03 DIAGNOSIS — R928 Other abnormal and inconclusive findings on diagnostic imaging of breast: Secondary | ICD-10-CM

## 2022-01-08 ENCOUNTER — Ambulatory Visit (INDEPENDENT_AMBULATORY_CARE_PROVIDER_SITE_OTHER): Payer: Medicare Other | Admitting: Internal Medicine

## 2022-01-08 ENCOUNTER — Encounter: Payer: Self-pay | Admitting: Internal Medicine

## 2022-01-08 VITALS — BP 122/80 | HR 75 | Temp 98.6°F | Ht 64.0 in | Wt 174.6 lb

## 2022-01-08 DIAGNOSIS — N182 Chronic kidney disease, stage 2 (mild): Secondary | ICD-10-CM

## 2022-01-08 DIAGNOSIS — J449 Chronic obstructive pulmonary disease, unspecified: Secondary | ICD-10-CM | POA: Diagnosis not present

## 2022-01-08 DIAGNOSIS — I129 Hypertensive chronic kidney disease with stage 1 through stage 4 chronic kidney disease, or unspecified chronic kidney disease: Secondary | ICD-10-CM | POA: Diagnosis not present

## 2022-01-08 DIAGNOSIS — E1122 Type 2 diabetes mellitus with diabetic chronic kidney disease: Secondary | ICD-10-CM

## 2022-01-08 DIAGNOSIS — Z6829 Body mass index (BMI) 29.0-29.9, adult: Secondary | ICD-10-CM

## 2022-01-08 DIAGNOSIS — Z794 Long term (current) use of insulin: Secondary | ICD-10-CM

## 2022-01-08 DIAGNOSIS — G4733 Obstructive sleep apnea (adult) (pediatric): Secondary | ICD-10-CM | POA: Diagnosis not present

## 2022-01-08 NOTE — Progress Notes (Signed)
[name] [name],acting as a Education administrator for [name] Greenland, MD.,have documented all relevant documentation on the behalf of [name] Greenland, MD,as directed by  [name] Greenland, MD while in the presence of [name] Greenland, MD.    Subjective:     Patient ID: [name] [name] , female    DOB: June 28, 1950 , 71 y.o.   MRN: [id number]   Chief Complaint  Patient presents with   Diabetes   Hypertension    HPI  Patient presents today for a diabetes. Patient reports compliance with her meds.   Diabetes She presents for her follow-up diabetic visit. She has type 2 diabetes mellitus. Her disease course has been improving. Pertinent negatives for hypoglycemia include no dizziness or headaches. Pertinent negatives for diabetes include no blurred vision, no chest pain, no fatigue, no polydipsia, no polyphagia and no polyuria. There are no hypoglycemic complications. Risk factors for coronary artery disease include diabetes mellitus, dyslipidemia, hypertension and post-menopausal. Current diabetic treatment includes insulin injections. She is compliant with treatment most of the time. She is following a diabetic diet. She participates in exercise intermittently. Eye exam is current.  Hypertension This is a chronic problem. The current episode started more than 1 year ago. The problem has been gradually improving since onset. The problem is controlled. Pertinent negatives include no blurred vision, chest pain, headaches or palpitations. Past treatments include angiotensin blockers. The current treatment provides moderate improvement. Compliance problems include exercise and diet.  Hypertensive end-organ damage includes kidney disease. Identifiable causes of hypertension include chronic renal disease.     Past Medical History:  Diagnosis Date   Anemia    Carpal tunnel syndrome    Coronary artery disease    DES to ostial RCA 2005   Diabetes mellitus without complication (HCC)    No longer taking meds    Dyslipidemia    Hypertension    Osteoarthritis    PAD (peripheral artery disease) (HCC)    Sleep apnea      Family History  Problem Relation Age of Onset   CAD Father 25   Heart attack Father    CAD Mother 63   Heart attack Mother    CAD Brother    Cancer Brother        prostate   CAD Sister    Cancer Sister        breast   CAD Brother      Current Outpatient Medications:    Accu-Chek FastClix Lancets MISC, CHECK BLOOD SUGAR THREE TIMES DAILY dx: e11.65, Disp: 100 each, Rfl: 2   amLODipine (NORVASC) 10 MG tablet, TAKE 1 TABLET(10 MG) BY MOUTH DAILY, Disp: 90 tablet, Rfl: 1   Ascorbic Acid (VITAMIN C) 500 MG CHEW, 1 tablet, Disp: , Rfl:    aspirin EC 81 MG tablet, Take 81 mg by mouth daily. Swallow whole., Disp: , Rfl:    Cholecalciferol ([employer] VIT D 5000 HIGH-POTENCY PO), Take 1 tablet by mouth daily., Disp: , Rfl:    Coenzyme Q10 (COQ-10) 100 MG CAPS, Take 1 tablet by mouth daily. , Disp: , Rfl:    Continuous Blood Gluc Receiver (FREESTYLE LIBRE 2 READER) DEVI, Use as directed to check blood sugars 3 times per day dx: e11.65, Disp: 1 each, Rfl: 1   Continuous Blood Gluc Sensor (FREESTYLE LIBRE 14 DAY SENSOR) MISC, Use as directed to check blood sugars 3 times per day dx: e11.65, Disp: 2 each, Rfl: 2   FARXIGA 10 MG TABS tablet, TAKE 1 TABLET(10 MG)  BY MOUTH DAILY BEFORE AND BREAKFAST, Disp: 30 tablet, Rfl: 1   Glucagon (GVOKE HYPOPEN 2-PACK) 0.5 MG/0.1ML SOAJ, Use for blood sugars less than 60., Disp: 0.2 mL, Rfl: 3   glucosamine-chondroitin 500-400 MG tablet, Take 1 tablet by mouth daily. Reported on 12/17/2015, Disp: , Rfl:    glucose blood (ONETOUCH VERIO) test strip, CHECK BLOOD SUGAR THREE TIMES DAILY dx: e11.65, Disp: 150 each, Rfl: 11   Insulin Pen Needle (BD PEN NEEDLE NANO 2ND GEN) 32G X 4 MM MISC, USE TO INJECT INSULIN DAILY, Disp: 100 each, Rfl: 2   MAGNESIUM OXIDE PO, Take 400 mg by mouth daily., Disp: , Rfl:    metoprolol tartrate (LOPRESSOR) 50 MG tablet, TAKE 1  TABLET BY MOUTH TWICE DAILY, Disp: 180 tablet, Rfl: 4   Misc Natural Products (NEURIVA) CAPS, Take 1 capsule by mouth daily., Disp: , Rfl:    nystatin (NYSTATIN) powder, Apply topically 3 (three) times daily. As needed, Disp: 60 g, Rfl: 0   omeprazole (PRILOSEC) 20 MG capsule, TAKE 1 CAPSULE BY MOUTH DAILY, Disp: 90 capsule, Rfl: 1   OneTouch Delica Lancets 95J MISC, USE AS DIRECTED TO CHECK BLOOD SUGAR THREE TIMES DAILY, Disp: 100 each, Rfl: 0   prednisoLONE acetate (PRED FORTE) 1 % ophthalmic suspension, Place 1 drop into the right eye 4 (four) times daily., Disp: , Rfl:    pregabalin (LYRICA) 50 MG capsule, TAKE 1 CAPSULE(50 MG) BY MOUTH AT BEDTIME, Disp: 90 capsule, Rfl: 1   Psyllium (METAMUCIL FIBER PO), Take by mouth., Disp: , Rfl:    rosuvastatin (CRESTOR) 20 MG tablet, TAKE 1 TABLET(20 MG) BY MOUTH DAILY, Disp: 90 tablet, Rfl: 3   traMADol (ULTRAM) 50 MG tablet, Take 1 tablet (50 mg total) by mouth every 6 (six) hours as needed., Disp: 20 tablet, Rfl: 0   traZODone (DESYREL) 50 MG tablet, TAKE 1 TABLET BY MOUTH EVERY NIGHT AT BEDTIME, Disp: 90 tablet, Rfl: 1   TRULICITY 1.5 OA/4.1YS SOPN, ADMINISTER 1.5 MG UNDER THE SKIN 1 TIME A WEEK, Disp: 6 mL, Rfl: 3   Vitamin A 3 MG (10000 UT) TABS, Take 1 tablet by mouth daily. 2,400 mcg, Disp: , Rfl:    Vitamin D, Ergocalciferol, (DRISDOL) 1.25 MG (50000 UNIT) CAPS capsule, TAKE 1 CAPSULE BY MOUTH EVERY 7 DAYS, Disp: 12 capsule, Rfl: 1   vitamin E 600 UNIT capsule, Take 400 Units by mouth daily., Disp: , Rfl:    Cyanocobalamin (VITAMIN B-12 PO), Take 500 mcg by mouth. (Patient not taking: Reported on 01/08/2022), Disp: , Rfl:    insulin lispro (HUMALOG KWIKPEN) 100 UNIT/ML KwikPen, Inject subcutaneously 2 units if sugars are 150 - 199, 4 units 200 -249, 6 units 250- 299, 8 units 300 - 349, 10 units >350 (Patient not taking: Reported on 01/08/2022), Disp: 15 mL, Rfl: 0   Allergies  Allergen Reactions   Fish Allergy     Cod   Other     Other  reaction(s): hives   Latex Rash     Review of Systems  Constitutional: Negative.  Negative for fatigue.  Eyes:  Negative for blurred vision.  Respiratory: Negative.    Cardiovascular: Negative.  Negative for chest pain and palpitations.  Gastrointestinal: Negative.   Endocrine: Negative for polydipsia, polyphagia and polyuria.  Neurological: Negative.  Negative for dizziness and headaches.  Psychiatric/Behavioral: Negative.       Today's Vitals   01/08/22 1616  BP: 122/80  Pulse: 75  Temp: 98.6 F (37 C)  Weight:  174 lb 9.6 oz (79.2 kg)  Height: _0  (1.626 m)  PainSc: 0-No pain   Body mass index is 29.97 kg/m.  Wt Readings from Last 3 Encounters:  01/08/22 174 lb 9.6 oz (79.2 kg)  11/19/21 174 lb 6.4 oz (79.1 kg)  10/17/21 175 lb 4.8 oz (79.5 kg)    Objective:  Physical Exam Vitals and nursing note reviewed.  Constitutional:      Appearance: Normal appearance.  HENT:     Head: Normocephalic and atraumatic.  Eyes:     Extraocular Movements: Extraocular movements intact.  Cardiovascular:     Rate and Rhythm: Normal rate and regular rhythm.     Heart sounds: Normal heart sounds.  Pulmonary:     Effort: Pulmonary effort is normal.     Breath sounds: Normal breath sounds.  Musculoskeletal:     Cervical back: Normal range of motion.  Skin:    General: Skin is warm.  Neurological:     General: No focal deficit present.     Mental Status: She is alert.  Psychiatric:        Mood and Affect: Mood normal.        Behavior: Behavior normal.      Assessment And Plan:     1. Type 2 diabetes mellitus with stage 2 chronic kidney disease, with long-term current use of insulin (HCC) Comments: Chronic, I will check labs as below. Goal a1c <7.0%. Importance of medication/dietary compliance was d/w patient.  - CMP14+EGFR - Hemoglobin A1c  2. Parenchymal renal hypertension, stage 1 through stage 4 or unspecified chronic kidney disease Comments: Chronic, well controlled.  She will c/w current meds - amlodipine and metoprolol.   3. OSA and COPD overlap syndrome ([facility]) Comments: Chronic, she admits to non-compliance with CPAP. Risks associated untreated OSA were d/w paitent.   4. BMI 29.0-29.9,adult Comments: She is encouraged to aim for at least 150 minutes of exercise per week.    Patient was given opportunity to ask questions. Patient verbalized understanding of the plan and was able to repeat key elements of the plan. All questions were answered to their satisfaction.   I, [name] Greenland, MD, have reviewed all documentation for this visit. The documentation on 01/19/22 for the exam, diagnosis, procedures, and orders are all accurate and complete.   IF YOU HAVE BEEN REFERRED TO A SPECIALIST, IT MAY TAKE 1-2 WEEKS TO SCHEDULE/PROCESS THE REFERRAL. IF YOU HAVE NOT HEARD FROM US/SPECIALIST IN TWO WEEKS, PLEASE GIVE [place] A CALL AT [phone] X [phone].   THE PATIENT IS ENCOURAGED TO PRACTICE SOCIAL DISTANCING DUE TO THE COVID-19 PANDEMIC.

## 2022-01-08 NOTE — Patient Instructions (Signed)

## 2022-01-09 LAB — CMP14+EGFR
ALT: 13 IU/L (ref 0–32)
AST: 18 IU/L (ref 0–40)
Albumin/Globulin Ratio: 1.6 (ref 1.2–2.2)
Albumin: 4.5 g/dL (ref 3.8–4.8)
Alkaline Phosphatase: 172 IU/L — ABNORMAL HIGH (ref 44–121)
BUN/Creatinine Ratio: 16 (ref 12–28)
BUN: 15 mg/dL (ref 8–27)
Bilirubin Total: <0.2 mg/dL (ref 0.0–1.2)
CO2: 25 mmol/L (ref 20–29)
Calcium: 9.4 mg/dL (ref 8.7–10.3)
Chloride: 105 mmol/L (ref 96–106)
Creatinine, Ser: 0.93 mg/dL (ref 0.57–1.00)
Globulin, Total: 2.9 g/dL (ref 1.5–4.5)
Glucose: 100 mg/dL — ABNORMAL HIGH (ref 70–99)
Potassium: 3.9 mmol/L (ref 3.5–5.2)
Sodium: 144 mmol/L (ref 134–144)
Total Protein: 7.4 g/dL (ref 6.0–8.5)
eGFR: 66 mL/min/{1.73_m2} (ref >59)

## 2022-01-09 LAB — HEMOGLOBIN A1C
Est. average glucose Bld gHb Est-mCnc: 166 mg/dL
Hgb A1c MFr Bld: 7.4 % — ABNORMAL HIGH (ref 4.8–5.6)

## 2022-01-13 ENCOUNTER — Ambulatory Visit: Payer: Medicare Other

## 2022-01-13 ENCOUNTER — Ambulatory Visit
Admission: RE | Admit: 2022-01-13 | Discharge: 2022-01-13 | Disposition: A | Discharge status: Home / Self Care | Payer: Medicare Other | Source: Ambulatory Visit | Attending: Internal Medicine | Admitting: Internal Medicine

## 2022-01-13 DIAGNOSIS — R928 Other abnormal and inconclusive findings on diagnostic imaging of breast: Secondary | ICD-10-CM

## 2022-01-17 DIAGNOSIS — Z794 Long term (current) use of insulin: Secondary | ICD-10-CM | POA: Diagnosis not present

## 2022-01-17 DIAGNOSIS — E1165 Type 2 diabetes mellitus with hyperglycemia: Secondary | ICD-10-CM | POA: Diagnosis not present

## 2022-01-20 ENCOUNTER — Telehealth: Payer: Self-pay

## 2022-01-20 NOTE — Chronic Care Management (AMB) (Signed)
    [name] [name] was reminded to have all medications, supplements and any blood glucose and blood pressure readings available for review with Cherylin Mylar, Pharm. D, at her telephone visit on 01-22-2022 at 12:00.    Huey Romans [facility] Clinical Pharmacist Assistant [phone]

## 2022-01-21 ENCOUNTER — Ambulatory Visit: Payer: Medicare Other

## 2022-01-21 VITALS — BP 110/60 | HR 67 | Temp 98.1°F

## 2022-01-21 DIAGNOSIS — E1122 Type 2 diabetes mellitus with diabetic chronic kidney disease: Secondary | ICD-10-CM

## 2022-01-21 MED ORDER — DEXCOM G7 SENSOR MISC: 3 refills | Status: DC

## 2022-01-21 NOTE — Progress Notes (Signed)
Patient present today for dexcom G7 teaching.

## 2022-01-22 ENCOUNTER — Ambulatory Visit (INDEPENDENT_AMBULATORY_CARE_PROVIDER_SITE_OTHER): Payer: Medicare Other

## 2022-01-22 DIAGNOSIS — I129 Hypertensive chronic kidney disease with stage 1 through stage 4 chronic kidney disease, or unspecified chronic kidney disease: Secondary | ICD-10-CM

## 2022-01-22 DIAGNOSIS — E1122 Type 2 diabetes mellitus with diabetic chronic kidney disease: Secondary | ICD-10-CM

## 2022-01-22 NOTE — Progress Notes (Signed)
Chronic Care Management Pharmacy Note  01/28/2022 Name:  [name] [name] MRN:  [id number] DOB:  06/06/1951  Summary: Patient reports that she is working on using her new CGM machine. She will call if she has any questions   Recommendations/Changes made from today's visit: Continue to monitor BS using CGM  Plan: She is going to determine if [employer] is able to deliver her CGM  [facility] to call patient next week to determine if patient is able to use Dexcom comfortably.   Subjective: [name] [name] is an 71 y.o. year old female who is a primary patient of Glendale Chard, MD.  The CCM team was consulted for assistance with disease management and care coordination needs.    Engaged with patient by telephone for follow up visit in response to provider referral for pharmacy case management and/or care coordination services.  Consent to Services:  The patient was given information about Chronic Care Management services, agreed to services, and gave verbal consent prior to initiation of services.  Please see initial visit note for detailed documentation.   Patient Care Team: Glendale Chard, MD as PCP - General (Internal Medicine) Minus Breeding, MD as PCP - Cardiology (Cardiology) [facility]  Recent office visits: 01/08/2022 PCP OV   Recent consult visits: 12/11/2021 [facility] visits: None in previous 6 months   Objective:  Lab Results  Component Value Date   CREATININE 0.93 01/08/2022   BUN 15 01/08/2022   EGFR 66 01/08/2022   GFRNONAA >60 06/03/2021   GFRAA 87 07/04/2020   NA 144 01/08/2022   K 3.9 01/08/2022   CALCIUM 9.4 01/08/2022   CO2 25 01/08/2022   GLUCOSE 100 (H) 01/08/2022    Lab Results  Component Value Date/Time   HGBA1C 7.4 (H) 01/08/2022 04:49 PM   HGBA1C 7.2 (H) 08/22/2021 10:16 AM   MICROALBUR 30 04/10/2021 05:02 PM   MICROALBUR 30 03/21/2021 10:30 AM    Last diabetic Eye exam:  Lab Results  Component Value Date/Time    HMDIABEYEEXA No Retinopathy 10/01/2021 12:00 AM    Last diabetic Foot exam: No results found for: "HMDIABFOOTEX"   Lab Results  Component Value Date   CHOL 154 04/10/2021   HDL 53 04/10/2021   LDLCALC 72 04/10/2021   TRIG 174 (H) 04/10/2021   CHOLHDL 2.9 04/10/2021       Latest Ref Rng & Units 01/08/2022    4:49 PM 06/03/2021   12:33 PM 04/10/2021    3:37 PM  Hepatic Function  Total Protein 6.0 - 8.5 g/dL 7.4  7.9  7.2   Albumin 3.8 - 4.8 g/dL 4.5  4.4  4.3   AST 0 - 40 IU/L _0 ALT 0 - 32 IU/L _1 Alk Phosphatase 44 - 121 IU/L 172  111  135   Total Bilirubin 0.0 - 1.2 mg/dL <0.2  0.8  0.3     Lab Results  Component Value Date/Time   TSH 1.160 04/10/2021 03:37 PM   TSH 0.467 09/19/2014 04:00 PM   FREET4 1.30 05/04/2009 06:20 AM       Latest Ref Rng & Units 06/03/2021   12:33 PM 04/10/2021    3:37 PM 05/07/2020    3:26 PM  CBC  WBC 4.0 - 10.5 K/uL 5.3  5.8  6.3   Hemoglobin 12.0 - 15.0 g/dL 12.6  12.3  12.3   Hematocrit 36.0 - 46.0 % 41.2  39.1  39.0   Platelets 150 - 400 K/uL 284  270  268     Lab Results  Component Value Date/Time   VD25OH 32.5 03/23/2019 10:48 AM   VD25OH 28.8 03/17/2018 12:00 AM    Clinical ASCVD: No  The 10-year ASCVD risk score (Arnett DK, et al., 2019) is: 31.4%   Values used to calculate the score:     Age: 89 years     Sex: Female     Is Non-Hispanic African American: Yes     Diabetic: Yes     Tobacco smoker: Yes     Systolic Blood Pressure: 119 mmHg     Is BP treated: Yes     HDL Cholesterol: 53 mg/dL     Total Cholesterol: 154 mg/dL       01/08/2022    4:33 PM 08/22/2021    9:53 AM 03/21/2021    9:26 AM  Depression screen PHQ 2/9  Decreased Interest 0 0 0  Down, Depressed, Hopeless 0 0 0  PHQ - 2 Score 0 0 0     Social History   Tobacco Use  Smoking Status Former   Packs/day: 0.50   Years: 30.00   Total pack years: 15.00   Types: Cigarettes   Quit date: 12/20/2013   Years since quitting: 8.1   Smokeless Tobacco Never   BP Readings from Last 3 Encounters:  01/21/22 110/60  01/08/22 122/80  11/19/21 112/70   Pulse Readings from Last 3 Encounters:  01/21/22 67  01/08/22 75  11/19/21 64   Wt Readings from Last 3 Encounters:  01/08/22 174 lb 9.6 oz (79.2 kg)  11/19/21 174 lb 6.4 oz (79.1 kg)  10/17/21 175 lb 4.8 oz (79.5 kg)   BMI Readings from Last 3 Encounters:  01/08/22 29.97 kg/m  11/19/21 30.50 kg/m  10/17/21 34.24 kg/m    Assessment/Interventions: Review of patient past medical history, allergies, medications, health status, including review of consultants reports, laboratory and other test data, was performed as part of comprehensive evaluation and provision of chronic care management services.   SDOH:  (Social Determinants of Health) assessments and interventions performed: No  SDOH Screenings   Alcohol Screen: Low Risk  (09/09/2018)   Alcohol Screen    Last Alcohol Screening Score (AUDIT): 0  Depression (PHQ2-9): Low Risk  (01/08/2022)   Depression (PHQ2-9)    PHQ-2 Score: 0  Financial Resource Strain: Low Risk  (03/21/2021)   Overall Financial Resource Strain (CARDIA)    Difficulty of Paying Living Expenses: Not hard at all  Food Insecurity: No Food Insecurity (03/21/2021)   Hunger Vital Sign    Worried About Running Out of Food in the Last Year: Never true    [place] in the Last Year: Never true  Housing: Medium Risk (09/09/2018)   Housing    Last Housing Risk Score: 1  Physical Activity: Inactive (03/21/2021)   Exercise Vital Sign    Days of Exercise per Week: 0 days    Minutes of Exercise per Session: 0 min  Social Connections: Unknown (09/09/2018)   Social Connection and Isolation Panel [NHANES]    Frequency of Communication with Friends and Family: More than three times a week    Frequency of Social Gatherings with Friends and Family: More than three times a week    Attends Religious Services: More than 4 times per year    Active Member  of [employer] or Organizations: Yes    Attends Archivist Meetings: More than  4 times per year    Marital Status: Not on file  Stress: No Stress Concern Present (03/21/2021)   [place]    Feeling of Stress : Only a little  Tobacco Use: Medium Risk (01/08/2022)   Patient History    Smoking Tobacco Use: Former    Smokeless Tobacco Use: Never    Passive Exposure: Not on file  Transportation Needs: No Transportation Needs (03/21/2021)   PRAPARE - Hydrologist (Medical): No    Lack of Transportation (Non-Medical): No    CCM Care Plan  Allergies  Allergen Reactions   Fish Allergy     Cod   Other     Other reaction(s): hives   Latex Rash    Medications Reviewed Today     Reviewed by Mayford Knife, RPH (Pharmacist) on 01/22/22 at 1250  Med List Status: <None>   Medication Order Taking? Sig Documenting Provider Last Dose Status Informant  Accu-Chek FastClix Lancets MISC [id number] No CHECK BLOOD SUGAR THREE TIMES DAILY dx: e11.65 Glendale Chard, MD Taking Active   amLODipine (NORVASC) 10 MG tablet [id number] No TAKE 1 TABLET(10 MG) BY MOUTH DAILY Glendale Chard, MD Taking Active   Ascorbic Acid (VITAMIN C) 500 MG CHEW [id number] No 1 tablet [provider] Taking Active   aspirin EC 81 MG tablet [id number] No Take 81 mg by mouth daily. Swallow whole. [provider] Taking Active   Cholecalciferol ([employer] VIT D 5000 HIGH-POTENCY PO) [id number] No Take 1 tablet by mouth daily. [provider] Taking Active   Coenzyme Q10 (COQ-10) 100 MG CAPS [id number] No Take 1 tablet by mouth daily.  [provider] Taking Active   Continuous Blood Gluc Receiver (FREESTYLE LIBRE 2 READER) [place] [id number] No Use as directed to check blood sugars 3 times per day dx: e11.65 Glendale Chard, MD Taking Active   Continuous Blood Gluc Sensor ([place]) [place] [id number]  Use to  check blood sugar 3 times a day. Dx code e11.65 Glendale Chard, MD  Active   Continuous Blood Gluc Sensor (FREESTYLE LIBRE 14 DAY SENSOR) [place] [id number] No Use as directed to check blood sugars 3 times per day dx: e11.65 Glendale Chard, MD Taking Active   Cyanocobalamin (VITAMIN B-12 PO) [id number] No Take 500 mcg by mouth.  Patient not taking: Reported on 01/08/2022   [provider] Not Taking Active   FARXIGA 10 MG TABS tablet [id number] No TAKE 1 TABLET(10 MG) BY MOUTH DAILY BEFORE AND Adella Nissen, MD Taking Active   Glucagon (GVOKE HYPOPEN 2-PACK) 0.5 MG/0.1ML SOAJ [id number] No Use for blood sugars less than 60. Glendale Chard, MD Taking Active   glucosamine-chondroitin 500-400 MG tablet [id number] No Take 1 tablet by mouth daily. Reported on 12/17/2015 [provider] Taking Active   glucose blood (ONETOUCH VERIO) test strip [id number] No CHECK BLOOD SUGAR THREE TIMES DAILY dx: e11.65 Glendale Chard, MD Taking Active   insulin lispro (HUMALOG KWIKPEN) 100 UNIT/ML KwikPen [id number] No Inject subcutaneously 2 units if sugars are 150 - 199, 4 units 200 -249, 6 units 250- 299, 8 units 300 - 349, 10 units >350  Patient not taking: Reported on 01/08/2022   Glendale Chard, MD Not Taking Active   Insulin Pen Needle (BD PEN NEEDLE NANO 2ND GEN) 32G X 4 MM MISC [id number] No USE TO INJECT INSULIN DAILY Glendale Chard, MD Taking Active   MAGNESIUM OXIDE PO [id number] No Take 400 mg  by mouth daily. [provider] Taking Active Self  metoprolol tartrate (LOPRESSOR) 50 MG tablet [id number] No TAKE 1 TABLET BY MOUTH TWICE DAILY Glendale Chard, MD Taking Active   Misc Natural Products (NEURIVA) CAPS [id number] No Take 1 capsule by mouth daily. [provider] Taking Active Self  nystatin (NYSTATIN) powder [id number] No Apply topically 3 (three) times daily. As needed Glendale Chard, MD Taking Active   omeprazole (PRILOSEC) 20 MG capsule [id number] No TAKE 1 CAPSULE BY  MOUTH DAILY Glendale Chard, MD Taking Active   OneTouch Delica Lancets [address] [place] [id number] No USE AS DIRECTED TO CHECK BLOOD SUGAR THREE TIMES DAILY Glendale Chard, MD Taking Active   prednisoLONE acetate (PRED FORTE) 1 % ophthalmic suspension [id number] No Place 1 drop into the right eye 4 (four) times daily. [provider] Taking Active   pregabalin (LYRICA) 50 MG capsule [id number] No TAKE 1 CAPSULE(50 MG) BY MOUTH AT BEDTIME Glendale Chard, MD Taking Active   Psyllium (METAMUCIL FIBER PO) [id number] No Take by mouth. [provider] Taking Active   rosuvastatin (CRESTOR) 20 MG tablet [id number] No TAKE 1 TABLET(20 MG) BY MOUTH DAILY Minus Breeding, MD Taking Active   traMADol (ULTRAM) 50 MG tablet [id number] No Take 1 tablet (50 mg total) by mouth every 6 (six) hours as needed. Minette Brine, FNP Taking Active   traZODone (DESYREL) 50 MG tablet [id number] No TAKE 1 TABLET BY MOUTH EVERY NIGHT AT BEDTIME Glendale Chard, MD Taking Active   TRULICITY 1.5 BE/6.7JQ [place] Aid [id number] No ADMINISTER 1.5 MG UNDER THE SKIN 1 TIME A Dayna Barker, MD Taking Active   Vitamin A 3 MG (10000 UT) TABS [id number] No Take 1 tablet by mouth daily. 2,400 mcg [provider] Taking Active Self  Vitamin D, Ergocalciferol, (DRISDOL) 1.25 MG (50000 UNIT) CAPS capsule [id number] No TAKE 1 CAPSULE BY MOUTH EVERY 7 DAYS Glendale Chard, MD Taking Active   vitamin E 600 UNIT capsule [id number] No Take 400 Units by mouth daily. [provider] Taking Active             Patient Active Problem List   Diagnosis Date Noted   Constipation 12/16/2021   Pelvic relaxation 10/17/2021   Parenchymal renal hypertension 07/04/2020   Educated about COVID-19 virus infection 03/25/2020   OSA and COPD overlap syndrome ([place]) 03/14/2019   Snoring 02/17/2019   Peripheral arterial disease ([place]) 01/26/2019   Hepatitis C carrier ([place]) 01/26/2019   Hypertensive heart and renal disease 08/19/2018   Class 1  obesity due to excess calories with serious comorbidity and body mass index (BMI) of 33.0 to 33.9 in adult 08/19/2018   Paresthesia of left upper extremity 06/29/2018   Atherosclerosis of native coronary artery of native heart with angina pectoris ([place]) 04/09/2018   Dyslipidemia 04/09/2018   Primary osteoarthritis of both feet 11/05/2017   Primary osteoarthritis of both hands 10/30/2017   Primary osteoarthritis of both knees 10/30/2017   Primary insomnia 10/30/2017   Family history of lupus erythematosus 10/30/2017   Dyspnea on exertion 12/10/2016   Type 2 diabetes mellitus with stage 2 chronic kidney disease, with long-term current use of insulin ([place]) 09/21/2014   Acute cystitis 09/21/2014   Hyperglycemia 09/19/2014   Bradycardia 09/19/2014   Hyponatremia 09/19/2014   Elevated alkaline phosphatase level 09/19/2014   Acute kidney injury [facility])    Essential hypertension, benign 07/05/2008   Coronary atherosclerosis 07/05/2008   TOBACCO USER 07/03/2008    Immunization History  Administered Date(s) Administered   [employer]  Quad(high Dose 65+) 03/04/2019   Influenza, High Dose Seasonal PF 03/21/2017, 03/11/2018   Influenza-Unspecified 03/07/2020, 03/13/2021   PFIZER(Purple Top)SARS-COV-2 Vaccination 07/30/2019, 08/20/2019, 04/16/2020, 11/23/2020, 05/01/2021   PNEUMOCOCCAL CONJUGATE-20 04/27/2021   Pneumococcal Conjugate-13 10/23/2016   Tdap 12/15/2014   Zoster Recombinat (Shingrix) 01/25/2019, 03/30/2019    Conditions to be addressed/monitored:  Hypertension and Diabetes  Care Plan : [place]  Updates made by Mayford Knife, [place] since 01/28/2022 12:00 AM     Problem: PAD, DM II   Priority: High     Long-Range Goal: Disease Management   Note:   Current Barriers:  Unable to independently monitor therapeutic efficacy Unable to achieve control of diabetes    Pharmacist Clinical Goal(s):  Patient will achieve adherence to monitoring guidelines and medication  adherence to achieve therapeutic efficacy achieve control of diabetes as evidenced by A1c <7 through collaboration with PharmD and provider.   Interventions: 1:1 collaboration with Glendale Chard, MD regarding development and update of comprehensive plan of care as evidenced by provider attestation and co-signature Inter-disciplinary care team collaboration (see longitudinal plan of care) Comprehensive medication review performed; medication list updated in electronic medical record  Hypertension (BP goal <130/80) -Controlled -Current treatment: Amlodipine 10 mg tablet - once per day Appropriate, Effective, Safe, Accessible Metoprolol tartrate 50 mg- take 1 tablet by mouth twice daily Appropriate, Effective, Safe, Accessible -Denies hypotensive/hypertensive symptoms -Educated on BP goals and benefits of medications for prevention of heart attack, stroke and kidney damage; Importance of home blood pressure monitoring; -Counseled to monitor BP at home at least , document, and provide log at future appointments -Recommended to continue current medication  Diabetes (A1c goal <7%) -Uncontrolled -Current medications: Insulin lispro - Inject subcutaneously 2 units if sugars are 150 - 199, 4 units 200 -249, 6 units 250- 299, 8 units 300 - 349, 10 units >350 Appropriate, Effective, Safe, Accessible Trulicity 1.5 UR/4.2 ml - administer 1.5 mg under the skin 1 time a week Appropriate, Effective, Safe, Accessible Farxiga 10 mg - taking tablet by mouth daily before and after breakfast Appropriate, Effective, Safe, Accessible GVOKE Hypopen 0.5 mg - use for blood sugars less than 60 Appropriate, Effective, Safe, Accessible -Patient reports that her freestyle Elenor Legato was only working for 12 days and then stop  working.  -She started checking her doing the fingerprick method and her BS would be good -She has now started using the Dexcom and it is placed.  -She was a bit confused the first day but now she  is trying to figure out how to get to the readings -Current home glucose readings -CGM: she is unaware of her current BS readings from the [facility]  -her current reading is 98, 148, 188, last night it was   -Denies hypoglycemic/hyperglycemic symptoms -Current meal patterns: she is avoiding fried and fatty foods. She cooks her vegetables in smoked [place].  snacks: Rarely eating an ice cream cone, no more then 4 times per month  drinks: she is drinking plenty of water  -Current exercise: she is very busy and walks frequently, she is not ready to go back to the [facility] to exercise.  -Educated on Complications of diabetes including kidney damage, retinal damage, and cardiovascular disease; Continuous glucose monitoring; -Counseled to check feet daily and get yearly eye exams -Recommended to continue current medication  Patient Goals/Self-Care Activities Patient will:  - take medications as prescribed as evidenced by patient report and record review  Follow Up Plan: The patient has been provided  with contact information for the care management team and has been advised to call with any health related questions or concerns.       Medication Assistance: None required.  Patient affirms current coverage meets needs.  Compliance/Adherence/Medication fill history: Care Gaps: COVID-19 Booster  Influenza Vaccine   Star-Rating Drugs: Farxiga 10 mg tablet  Rosuvastatin 20 mg tablet  Trulicity 1.5 mg   Patient's preferred pharmacy is:  [facility] [place], [address] [place] [address] Phone: [phone] Fax: [phone]  [place], [address] [place] [address] Phone: [phone] Fax: [phone]  Uses pill box? Yes Pt endorses 90% compliance  We discussed: Benefits of medication synchronization, packaging and delivery as  well as enhanced pharmacist oversight with [employer]. Patient decided to: Continue current medication management strategy  Care Plan and Follow Up Patient Decision:  Patient agrees to Care Plan and Follow-up.  Plan: The patient has been provided with contact information for the care management team and has been advised to call with any health related questions or concerns.   Orlando Penner, CPP, PharmD Clinical Pharmacist Practitioner [facility] [phone]

## 2022-01-26 ENCOUNTER — Other Ambulatory Visit: Payer: Self-pay | Admitting: Internal Medicine

## 2022-01-28 NOTE — Patient Instructions (Addendum)
Visit Information It was great speaking with you today!  Please let me know if you have any questions about our visit.   Goals Addressed             This Visit's Progress    Manage My Medicine       Timeframe:  Long-Range Goal Priority:  High Start Date:        01/09/2021                     Expected End Date:                       Follow Up Date 05/13/2022  In Progress:   - call for medicine refill 2 or 3 days before it runs out - call if I am sick and can't take my medicine - keep a list of all the medicines I take; vitamins and herbals too - use a pillbox to sort medicine - use an alarm clock or phone to remind me to take my medicine    Why is this important?   These steps will help you keep on track with your medicines.   Notes:  Please call if you have any questions.         Patient Care Plan: CCM Pharmacy Care Plan     Problem Identified: PAD, DM II   Priority: High     Long-Range Goal: Disease Management   Note:   Current Barriers:  Unable to independently monitor therapeutic efficacy Unable to achieve control of diabetes    Pharmacist Clinical Goal(s):  Patient will achieve adherence to monitoring guidelines and medication adherence to achieve therapeutic efficacy achieve control of diabetes as evidenced by A1c <7 through collaboration with PharmD and provider.   Interventions: 1:1 collaboration with [name], MD regarding development and update of comprehensive plan of care as evidenced by provider attestation and co-signature Inter-disciplinary care team collaboration (see longitudinal plan of care) Comprehensive medication review performed; medication list updated in electronic medical record  Hypertension (BP goal <130/80) -Controlled -Current treatment: Amlodipine 10 mg tablet - once per day Appropriate, Effective, Safe, Accessible Metoprolol tartrate 50 mg- take 1 tablet by mouth twice daily Appropriate, Effective, Safe,  Accessible -Denies hypotensive/hypertensive symptoms -Educated on BP goals and benefits of medications for prevention of heart attack, stroke and kidney damage; Importance of home blood pressure monitoring; -Counseled to monitor BP at home at least , document, and provide log at future appointments -Recommended to continue current medication  Diabetes (A1c goal <7%) -Uncontrolled -Current medications: Insulin lispro - Inject subcutaneously 2 units if sugars are 150 - 199, 4 units 200 -249, 6 units 250- 299, 8 units 300 - 349, 10 units >350 Appropriate, Effective, Safe, Accessible Trulicity 1.5 mg/0.5 ml - administer 1.5 mg under the skin 1 time a week Appropriate, Effective, Safe, Accessible Farxiga 10 mg - taking tablet by mouth daily before and after breakfast Appropriate, Effective, Safe, Accessible GVOKE Hypopen 0.5 mg - use for blood sugars less than 60 Appropriate, Effective, Safe, Accessible -Patient reports that her freestyle Josephine Igo was only working for 12 days and then stop  working.  -She started checking her doing the fingerprick method and her BS would be good -She has now started using the Dexcom and it is placed.  -She was a bit confused the first day but now she is trying to figure out how to get to the readings -Current home glucose readings -CGM: she is  unaware of her current BS readings from the [facility]  -her current reading is 98, 148, 188, last night it was   -Denies hypoglycemic/hyperglycemic symptoms -Current meal patterns: she is avoiding fried and fatty foods. She cooks her vegetables in smoked [place].  snacks: Rarely eating an ice cream cone, no more then 4 times per month  drinks: she is drinking plenty of water  -Current exercise: she is very busy and walks frequently, she is not ready to go back to the [facility] to exercise.  -Educated on Complications of diabetes including kidney damage, retinal damage, and cardiovascular disease; Continuous glucose  monitoring; -Counseled to check feet daily and get yearly eye exams -Recommended to continue current medication  Patient Goals/Self-Care Activities Patient will:  - take medications as prescribed as evidenced by patient report and record review  Follow Up Plan: The patient has been provided with contact information for the care management team and has been advised to call with any health related questions or concerns.       Patient agreed to services and verbal consent obtained.   The patient verbalized understanding of instructions, educational materials, and care plan provided today and agreed to receive a mailed copy of patient instructions, educational materials, and care plan.   Cherylin Mylar, PharmD Clinical Pharmacist [facility] [phone]

## 2022-01-31 ENCOUNTER — Telehealth: Payer: Self-pay

## 2022-01-31 NOTE — Chronic Care Management (AMB) (Signed)
[employer] patient assistance program notification:  120- day supply of [name] was filled on 01/15/2022 and should arrive to the office in 10-14 business days. Patient has 0  refill remaining and enrollment will expire on 05/22/2022.  The next refill for patient will be fulfilled on 04/03/2022.  Reorder form filled out for [facility] for patient to continue receiving refills until end of enrollment year.  Billee Cashing, CMA Clinical Pharmacist Assistant [phone]

## 2022-02-05 ENCOUNTER — Other Ambulatory Visit: Payer: Self-pay | Admitting: Internal Medicine

## 2022-02-07 ENCOUNTER — Telehealth: Payer: Self-pay

## 2022-02-07 NOTE — Telephone Encounter (Signed)
Pt notified that [name] has arrived and is ready for pick up.

## 2022-02-11 ENCOUNTER — Other Ambulatory Visit: Payer: Self-pay | Admitting: Internal Medicine

## 2022-02-16 DIAGNOSIS — E1165 Type 2 diabetes mellitus with hyperglycemia: Secondary | ICD-10-CM | POA: Diagnosis not present

## 2022-02-16 DIAGNOSIS — Z794 Long term (current) use of insulin: Secondary | ICD-10-CM | POA: Diagnosis not present

## 2022-02-20 DIAGNOSIS — Z794 Long term (current) use of insulin: Secondary | ICD-10-CM

## 2022-02-20 DIAGNOSIS — E1159 Type 2 diabetes mellitus with other circulatory complications: Secondary | ICD-10-CM

## 2022-02-20 DIAGNOSIS — I10 Essential (primary) hypertension: Secondary | ICD-10-CM | POA: Diagnosis not present

## 2022-03-06 ENCOUNTER — Telehealth: Payer: Self-pay

## 2022-03-06 NOTE — Chronic Care Management (AMB) (Signed)
03-06-2022: Patient notified that fiasp is ready for pick up. Patient will come by office on Monday.  Huey Romans [facility] Clinical Pharmacist Assistant [phone]

## 2022-03-13 ENCOUNTER — Other Ambulatory Visit: Payer: Self-pay | Admitting: Internal Medicine

## 2022-03-14 ENCOUNTER — Encounter: Payer: Self-pay | Admitting: Internal Medicine

## 2022-03-17 ENCOUNTER — Encounter: Payer: Self-pay | Admitting: Podiatry

## 2022-03-17 ENCOUNTER — Ambulatory Visit (INDEPENDENT_AMBULATORY_CARE_PROVIDER_SITE_OTHER): Payer: Medicare Other | Admitting: Podiatry

## 2022-03-17 DIAGNOSIS — E119 Type 2 diabetes mellitus without complications: Secondary | ICD-10-CM | POA: Diagnosis not present

## 2022-03-17 DIAGNOSIS — L84 Corns and callosities: Secondary | ICD-10-CM | POA: Diagnosis not present

## 2022-03-17 NOTE — Progress Notes (Signed)
  Subjective:  Patient ID: [name] [name], female    DOB: [birth year],  MRN: [id number] Chief Complaint  Patient presents with   Nail Problem    Diabetic foot care BS-176 A1C-7.2 PCP-Sanders,Robyn PCP VST-11/2020   New problem(s): None.   PCP is Glendale Chard, MD , and last visit was  January 08, 2022.  Allergies  Allergen Reactions   Fish Allergy     Cod   Other     Other reaction(s): hives   Latex Rash    Review of Systems: Negative except as noted in the HPI.  Objective: No changes noted in today's physical examination. [name] [name] is a pleasant 71 y.o. female in NAD. AAO x 3.  Vascular Examination: CFT <4 seconds b/l. Pedal pulses faintly palpable b/l. Digital hair absent. Skin temperature gradient WNL b/l.  No ischemia or gangrene. No cyanosis or clubbing noted b/l.    Neurological Examination: Sensation grossly intact b/l with 10 gram monofilament. Vibratory sensation intact b/l.   Dermatological Examination: Pedal integument with normal turgor, texture and tone BLE. No open wounds b/l LE. No interdigital macerations noted b/l LE. Toenails 1-5 b/l well maintained with adequate length. No erythema, no edema, no drainage, no fluctuance. Hyperkeratotic lesion(s) plantarlateral aspect of midfoot right foot.  No erythema, no edema, no drainage, no fluctuance.  Musculoskeletal Examination: Muscle strength 5/5 to all lower extremity muscle groups bilaterally. No pain, crepitus or joint limitation noted with ROM bilateral LE. Hallux valgus with bunion deformity noted right lower extremity. Hammertoe deformity noted 2-5 b/l. Pes planus deformity noted bilateral LE.  Radiographs: None Assessment/Plan: 1. Callus   2. Diabetes mellitus without complication ([place])     -Consent given for treatment as described below: -Examined patient. -Medicaid ABN on file for paring of corn(s)/callus(es)/porokeratos(es). Copy in patient chart. -Patient to continue soft, supportive shoe gear  daily. -Callus(es) submet head 5 right foot pared utilizing sterile scalpel blade without complication or incident. Total number debrided =1. -Patient/POA to call should there be question/concern in the interim.   Return in about 3 months (around 06/16/2022).  Marzetta Board, DPM

## 2022-03-18 ENCOUNTER — Other Ambulatory Visit: Payer: Self-pay | Admitting: Internal Medicine

## 2022-03-18 DIAGNOSIS — Z794 Long term (current) use of insulin: Secondary | ICD-10-CM | POA: Diagnosis not present

## 2022-03-18 DIAGNOSIS — E1165 Type 2 diabetes mellitus with hyperglycemia: Secondary | ICD-10-CM | POA: Diagnosis not present

## 2022-03-18 DIAGNOSIS — Z1152 Encounter for screening for COVID-19: Secondary | ICD-10-CM | POA: Diagnosis not present

## 2022-03-26 ENCOUNTER — Encounter: Payer: Self-pay | Admitting: Internal Medicine

## 2022-04-09 IMAGING — CT CT CHEST W/O CM
2 of 5 series · 14 of 36 positions shown, 17 images · non-contrast
Comparison: Chest CT 09/03/2018.

CLINICAL DATA: 70-year-old female former smoker.



[Series 4: chest 2.00 br40 s3 · coronal · 0.63mm/px · 3 of 153 slices shown]
[im 31/153  lung]
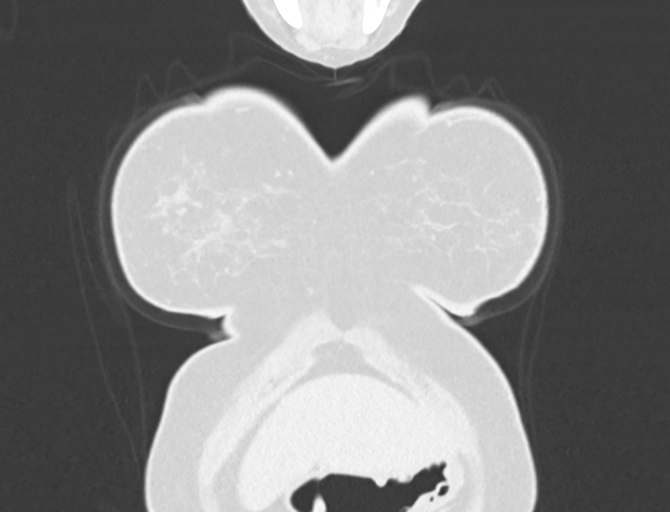
[im 61/153  lung]
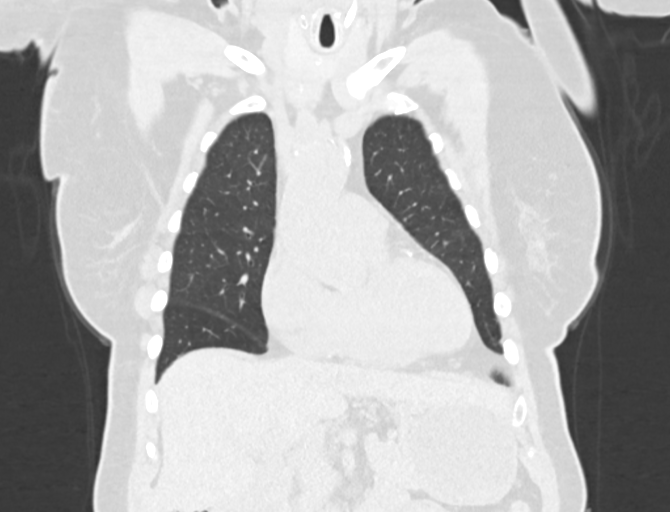
[im 92/153  lung]
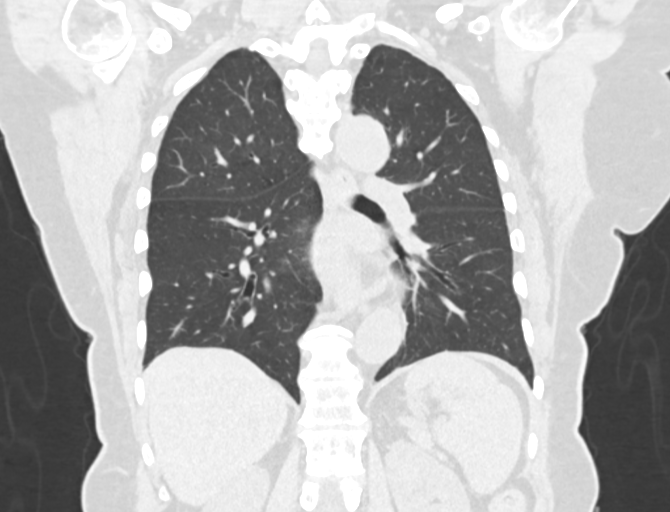

[Series 10: chest 1.00 br40 s3 super d · axial · 0.75mm/px · z∈[+1492,+1771]mm · 11 of 402 slices shown, 14 images]
[im 27/402  mediastinal]
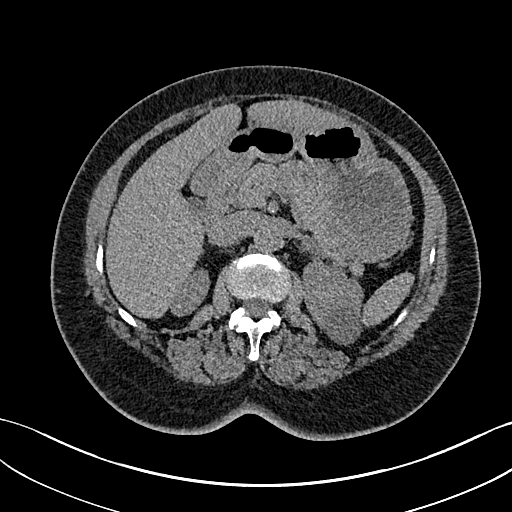
[im 27/402  lung]
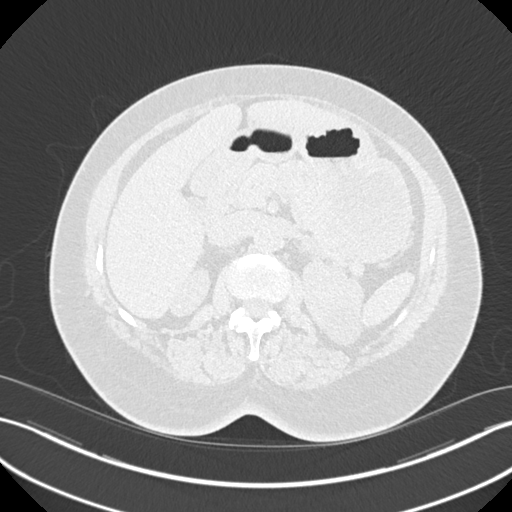
[im 54/402  lung]
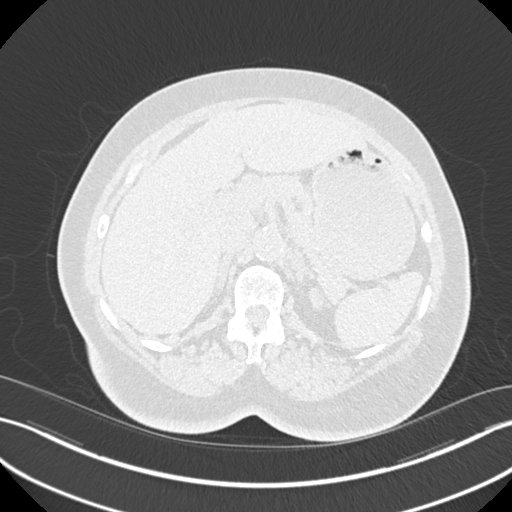
[im 107/402  lung]
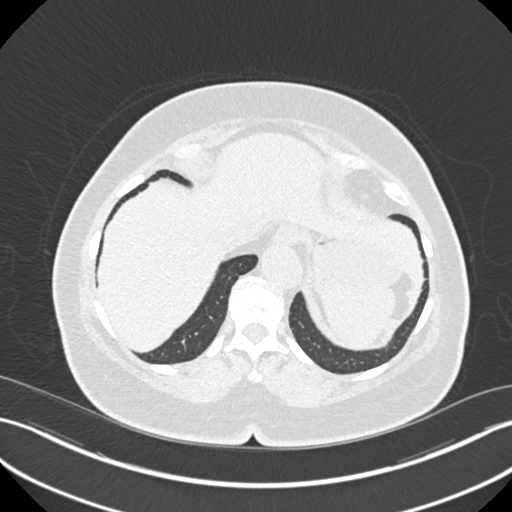
[im 134/402  lung]
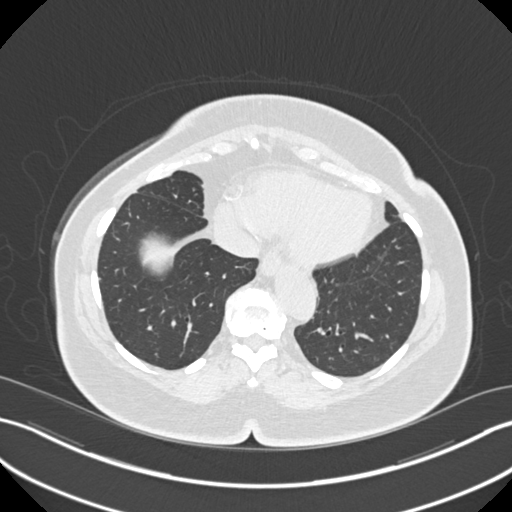
[im 161/402  mediastinal]
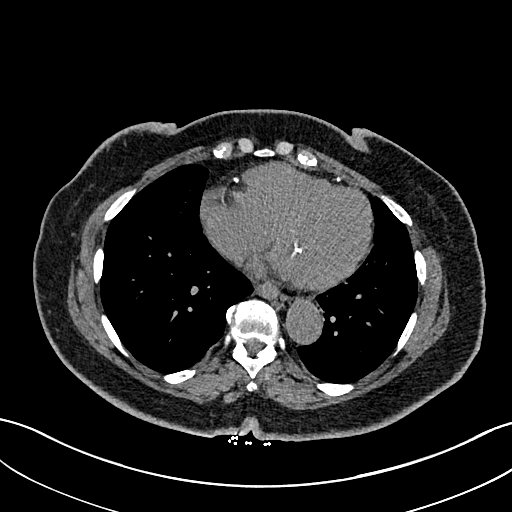
[im 161/402  lung]
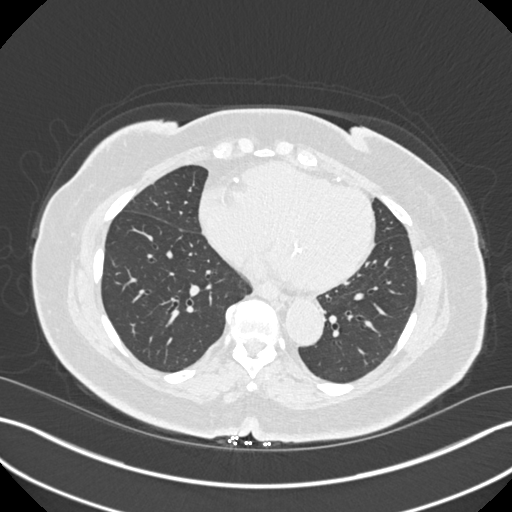
[im 214/402  lung]
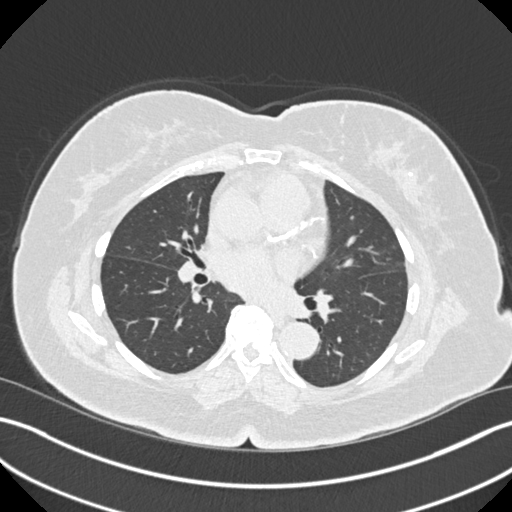
[im 241/402  lung]
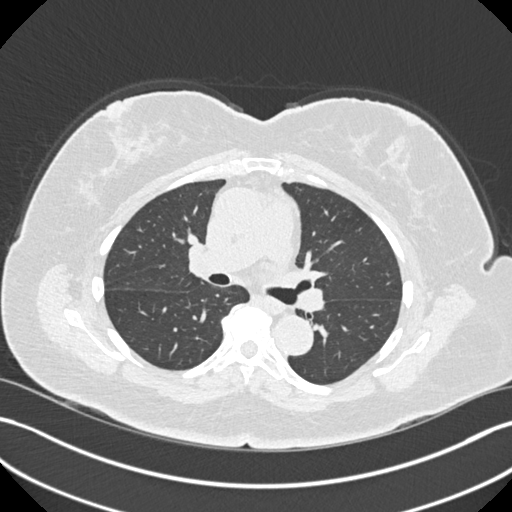
[im 268/402  lung]
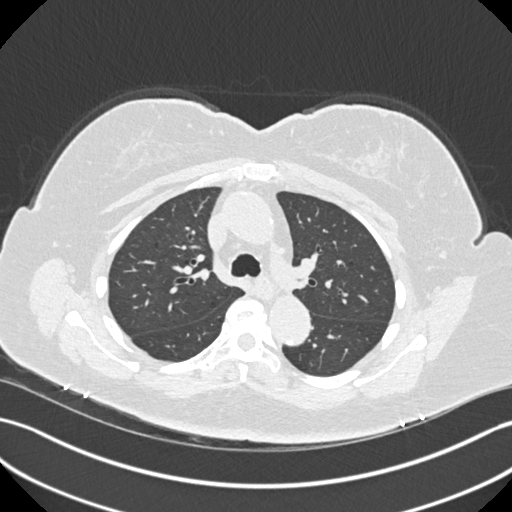
[im 295/402  mediastinal]
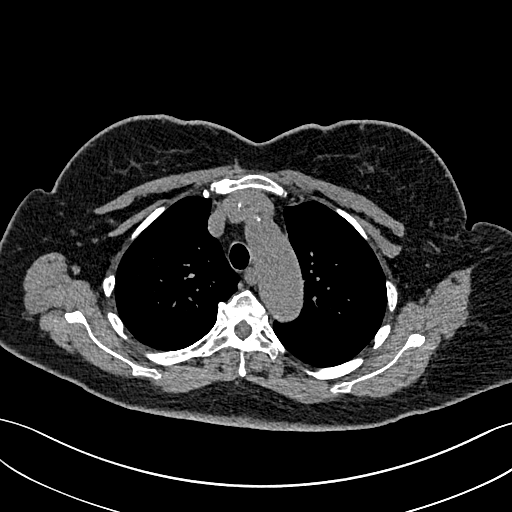
[im 295/402  lung]
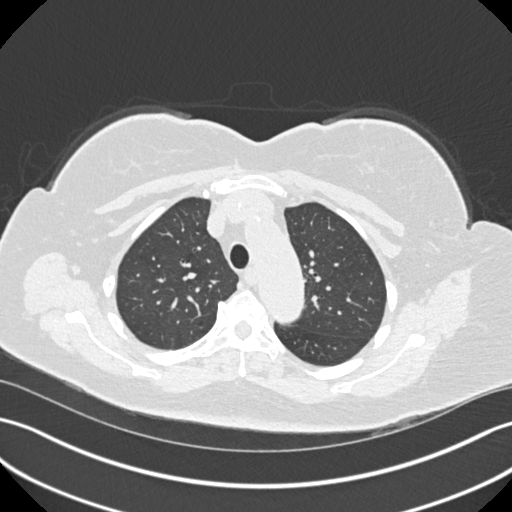
[im 348/402  lung]
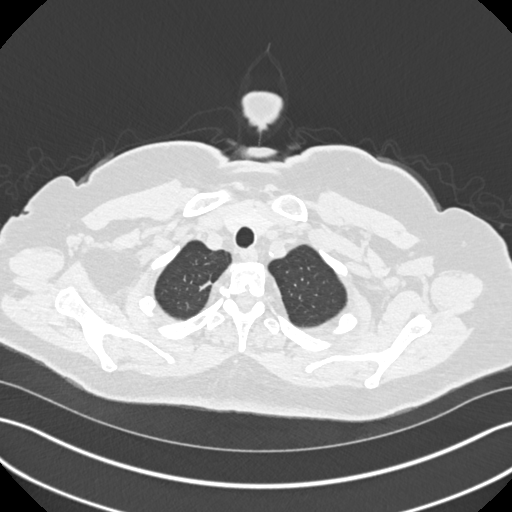
[im 375/402  lung]
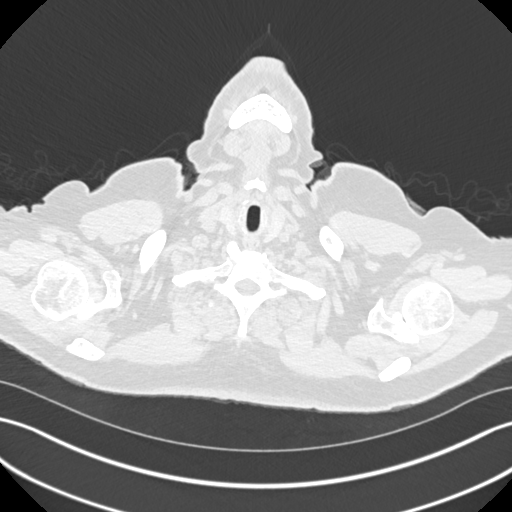

[14 of 36 positions shown; findings below may reference images not displayed]

FINDINGS: Cardiovascular: Heart size is normal. There is no significant
pericardial fluid, thickening or pericardial calcification. There is
aortic atherosclerosis, as well as atherosclerosis of the great
vessels of the mediastinum and the coronary arteries, including
calcified atherosclerotic plaque in the left main, left anterior
descending, left circumflex and right coronary arteries.

Mediastinum/Nodes: No pathologically enlarged mediastinal or hilar
lymph nodes. Please note that accurate exclusion of hilar adenopathy
is limited on noncontrast CT scans. Esophagus is unremarkable in
appearance. No axillary lymphadenopathy.

Lungs/Pleura: No suspicious appearing pulmonary nodules or masses
are noted. No acute consolidative airspace disease. No pleural
effusions. Mild diffuse bronchial wall thickening with very mild
centrilobular and paraseptal emphysema.

Upper Abdomen: Aortic atherosclerosis. Exophytic 3 cm
low-attenuation lesion in the upper pole of the left kidney,
incompletely characterized on today's non-contrast CT examination,
but similar to the prior study and statistically likely a cyst.

Musculoskeletal: There are no aggressive appearing lytic or blastic
lesions noted in the visualized portions of the skeleton.
IMPRESSION: 1. No suspicious pulmonary nodules or masses noted.
2. No acute findings in the thorax.
3. Aortic atherosclerosis, in addition to left main and three-vessel
coronary artery disease. Please note that although the presence of
coronary artery calcium documents the presence of coronary artery
disease, the severity of this disease and any potential stenosis
cannot be assessed on this non-gated CT examination. Assessment for
potential risk factor modification, dietary therapy or pharmacologic
therapy may be warranted, if clinically indicated.
4. Mild diffuse bronchial wall thickening with very mild
centrilobular and paraseptal emphysema; imaging findings suggestive
of underlying COPD.

Aortic Atherosclerosis (F0A7Q-19J.J) and Emphysema (F0A7Q-0ZW.C).

## 2022-04-16 ENCOUNTER — Ambulatory Visit (INDEPENDENT_AMBULATORY_CARE_PROVIDER_SITE_OTHER): Payer: Medicare Other | Admitting: Obstetrics and Gynecology

## 2022-04-16 ENCOUNTER — Encounter: Payer: Self-pay | Admitting: Obstetrics and Gynecology

## 2022-04-16 ENCOUNTER — Other Ambulatory Visit: Payer: Self-pay | Admitting: Internal Medicine

## 2022-04-16 VITALS — BP 126/83 | HR 50 | Ht 63.0 in | Wt 172.0 lb

## 2022-04-16 DIAGNOSIS — N8110 Cystocele, unspecified: Secondary | ICD-10-CM | POA: Diagnosis not present

## 2022-04-16 DIAGNOSIS — N812 Incomplete uterovaginal prolapse: Secondary | ICD-10-CM

## 2022-04-16 DIAGNOSIS — R159 Full incontinence of feces: Secondary | ICD-10-CM

## 2022-04-16 DIAGNOSIS — R350 Frequency of micturition: Secondary | ICD-10-CM

## 2022-04-16 DIAGNOSIS — R339 Retention of urine, unspecified: Secondary | ICD-10-CM | POA: Diagnosis not present

## 2022-04-16 DIAGNOSIS — N3941 Urge incontinence: Secondary | ICD-10-CM

## 2022-04-16 LAB — POCT URINALYSIS DIPSTICK
Bilirubin, UA: NEGATIVE
Blood, UA: NEGATIVE
Glucose, UA: POSITIVE — AB
Ketones, UA: NEGATIVE
Leukocytes, UA: NEGATIVE
Nitrite, UA: NEGATIVE
Protein, UA: NEGATIVE
Spec Grav, UA: 1.02 (ref 1.010–1.025)
Urobilinogen, UA: 0.2 E.U./dL
pH, UA: 6.5 (ref 5.0–8.0)

## 2022-04-16 NOTE — Patient Instructions (Signed)
You have a stage 2 (out of 4) prolapse.  We discussed the fact that it is not life threatening but there are several treatment options. For treatment of pelvic organ prolapse, we discussed options for management including expectant management, conservative management, and surgical management, such as Kegels, a pessary, pelvic floor physical therapy, and specific surgical procedures.      URODYNAMICS (UDS) TEST INFORMATION  IMPORTANT: Please try to arrive with a comfortably full bladder!    What is UDS? Urodynamics is a bladder test used to evaluate how your bladder and urethra (tube you urinate out of) work to help find out the cause of your bladder symptoms and evaluate your bladder function in order to make the best treatment plan for you.   What to expect? A nurse will perform the test and will be with you during the entire exam. First we will have to empty your bladder on a special toilet.  After you have emptied your bladder, very small catheters (plastic tubing) will be placed into your bladder and into your vagina (or rectum). These special small catheters measure pressure to help measure your bladder function.  Your bladder will be gently filled with water and you will be asked to cough and strain at several different points during the test.   You will then be asked to empty your bladder in the special toilet with the catheters in place. Most patients can urinate (pee) easily with the catheters in place since the catheters are so small. In total this procedure lasts about 45 minutes to 1 hour.  After your test is completed, you will return (or possibly be seen the same day) to review the results, talk about treatment options and make a plan moving forward.  

## 2022-04-16 NOTE — Progress Notes (Signed)
[facility] New Patient Evaluation and Consultation  Referring Provider: Chancy Milroy, MD PCP: Glendale Chard, MD Date of Service: 04/16/2022  SUBJECTIVE Chief Complaint: New Patient (Initial Visit) [name] [name] is a 71 y.o. female here for a consult for prolapse./)  History of Present Illness: [name] [name] is a 71 y.o. Black or African-American female seen in consultation at the request of Dr. Rip Harbour for evaluation of prolapse.    Review of records significant for: Noticed a vaginal bulge in the last year. Cystocele noted on exam. No urinary incontinence  Urinary Symptoms: Leaks urine with with movement to the bathroom and with urgency Leaks 1 time(s) per day.  Pad use: 1 liners/ mini-pads per day.   She is not bothered by her UI symptoms.  Day time voids 5.  Nocturia: 1 times per night to void. Voiding dysfunction: she empties her bladder well.  does not use a catheter to empty bladder.  When urinating, she feels she has no difficulties Drinks: 1 cup coffee in AM, otherwise water per day Does not use her CPAP machine  UTIs:  0  UTI's in the last year.   Denies history of blood in urine and kidney or bladder stones  Pelvic Organ Prolapse Symptoms:                  She Admits to a feeling of a bulge the vaginal area. It has been present for months. Feels it all the time.  She Admits to seeing a bulge.  This bulge is bothersome.   Bowel Symptom: Bowel movements: 1-2 time(s) per day Stool consistency: soft - usually [place] 3-4, occasionally has constipation Straining: sometimes.  Splinting: no.  Incomplete evacuation: yes.  She Admits to accidental bowel leakage / fecal incontinence  Occurs: 1-2 time(s) per day  Consistency with leakage: soft  Bowel regimen: miralax- has not taken it for months.  Last colonoscopy: Date- 4/ 2021  Sexual Function Sexually active: no.    Pelvic Pain Denies pelvic pain    Past Medical History:  Past Medical  History:  Diagnosis Date   Anemia    Carpal tunnel syndrome    Coronary artery disease    DES to ostial RCA 2005   Diabetes mellitus without complication ([place])    No longer taking meds   Dyslipidemia    Hypertension    Osteoarthritis    PAD (peripheral artery disease) ([place])    Sleep apnea      Past Surgical History:   Past Surgical History:  Procedure Laterality Date   APPENDECTOMY     BREAST EXCISIONAL BIOPSY Right >10+ yrs ago   benign   CORONARY ANGIOPLASTY WITH STENT PLACEMENT     2006   heart stent     TONSILLECTOMY     WRIST SURGERY       Past OB/GYN History: OB History  Gravida Para Term Preterm AB Living  3       1 1   SAB IAB Ectopic Multiple Live Births  1       2    # Outcome Date GA Lbr Len/2nd Weight Sex Delivery Anes PTL Lv  3 Gravida           2 Gravida           1 SAB             Vaginal deliveries: 2,  Forceps/ Vacuum deliveries: 0, Cesarean section: 0 Menopausal: Yes, at age 34, Denies vaginal bleeding since menopause  Last pap smear was 2021- negative.    Medications: She has a current medication list which includes the following prescription(s): amlodipine, vitamin c, aspirin ec, cholecalciferol, coq-10, dexcom g7 sensor, farxiga, gvoke hypopen 2-pack, glucosamine-chondroitin, glucose blood, insulin lispro, bd pen needle nano 2nd gen, magnesium oxide, metoprolol tartrate, neuriva, nystatin, omeprazole, onetouch delica lancets 33g, pregabalin, rosuvastatin, tramadol, trazodone, trulicity, vitamin a, vitamin d (ergocalciferol), and vitamin e.   Allergies: Patient is allergic to fish allergy, other, and latex.   Social History:  Social History   Tobacco Use   Smoking status: Former    Packs/day: 0.50    Years: 30.00    Total pack years: 15.00    Types: Cigarettes    Quit date: 12/20/2013    Years since quitting: 8.3   Smokeless tobacco: Never  Vaping Use   Vaping Use: Never used  Substance Use Topics   Alcohol use: Yes    Comment:  rarely   Drug use: Not Currently    Comment: quit 2013    Relationship status: single She lives alone.   She is not employed. Regular exercise: Yes:   History of abuse: No  Family History:   Family History  Problem Relation Age of Onset   CAD Father 79   Heart attack Father    CAD Mother 49   Heart attack Mother    CAD Brother    Cancer Brother        prostate   CAD Sister    Cancer Sister        breast   CAD Brother      Review of Systems: Review of Systems  Constitutional:  Positive for weight loss. Negative for fever and malaise/fatigue.  Respiratory:  Negative for cough, shortness of breath and wheezing.   Cardiovascular:  Negative for chest pain, palpitations and leg swelling.  Gastrointestinal:  Positive for abdominal pain. Negative for blood in stool.  Genitourinary:  Negative for dysuria.       + vaginal discharge  Musculoskeletal:  Negative for myalgias.  Skin:  Negative for rash.  Neurological:  Negative for dizziness and headaches.  Endo/Heme/Allergies:  Does not bruise/bleed easily.       + hot flashes  Psychiatric/Behavioral:  Negative for depression. The patient is not nervous/anxious.      OBJECTIVE Physical Exam: Vitals:   04/16/22 0859  BP: 126/83  Pulse: (!) 50  Weight: 172 lb (78 kg)  Height: 5\' 3"  (1.6 m)    Physical Exam Constitutional:      General: She is not in acute distress. Pulmonary:     Effort: Pulmonary effort is normal.  Abdominal:     General: There is no distension.     Palpations: Abdomen is soft.     Tenderness: There is no abdominal tenderness. There is no rebound.  Musculoskeletal:        General: No swelling. Normal range of motion.  Skin:    General: Skin is warm and dry.     Findings: No rash.  Neurological:     Mental Status: She is alert and oriented to person, place, and time.  Psychiatric:        Mood and Affect: Mood normal.        Behavior: Behavior normal.      GU / Detailed Urogynecologic  Evaluation:  Pelvic Exam: Normal external female genitalia; Bartholin's and Skene's glands normal in appearance; urethral meatus normal in appearance, no urethral masses or discharge.   CST: negative  Speculum exam reveals normal vaginal mucosa with atrophy. Cervix normal appearance. Uterus normal single, nontender. Adnexa no mass, fullness, tenderness.    Pelvic floor strength I/V, puborectalis I/V external anal sphincter I/V  Pelvic floor musculature: Right levator non-tender, Right obturator non-tender, Left levator non-tender, Left obturator non-tender  POP-Q:   POP-Q  -1                                            Aa   -1                                           Ba  -5.5                                              C   3                                            Gh  1.5                                            Pb  7.5                                            tvl   -2.5                                            Ap  -2.5                                            Bp  -7                                              D      Rectal Exam:  Normal sphincter tone, minimal perineal body, no distal rectocele, enterocoele not present, no rectal masses, no sign of dyssynergia when asking the patient to bear down.  Post-Void Residual (PVR) by Bladder Scan: In order to evaluate bladder emptying, we discussed obtaining a postvoid residual and she agreed to this procedure.  Procedure: The ultrasound unit was placed on the patient's abdomen in the suprapubic region after the patient had voided. A PVR of 283 ml was obtained by bladder scan.  Catheter placed to drain bladder and PVR of 324ml was confirmed.   Laboratory Results: POC urine: negative    ASSESSMENT AND PLAN Ms. Elward is a 71 y.o. with:  1. Prolapse of anterior  vaginal wall   2. Uterovaginal prolapse, incomplete   3. Incomplete bladder emptying   4. Urge incontinence   5. Urinary frequency   6.  Incontinence of feces, unspecified fecal incontinence type    Stage II anterior, Stage I posterior, Stage I apical prolapse - For treatment of pelvic organ prolapse, we discussed options for management including expectant management, conservative management, and surgical management, such as Kegels, a pessary, pelvic floor physical therapy, and specific surgical procedures. - She would like to try a pessary first, will return for a fitting.   2. Incomplete bladder emptying - Will evaluate further with urodynamic testing.  - We discussed that incomplete emptying may be due to prolapse vs neurologic condition (DM).   3. Urge incontinence - likely increased by incomplete emptying, will defer treatment   4. Accidental Bowel Leakage:  - Treatment options include anti-diarrhea medication (loperamide/ Imodium OTC or prescription lomotil), fiber supplements, physical therapy, and possible sacral neuromodulation or surgery.   - Recommended fiber supplementation.   Return for pessary fitting and urodynamics   Jaquita Folds, MD

## 2022-04-17 ENCOUNTER — Encounter: Payer: Medicare Other | Admitting: Internal Medicine

## 2022-04-17 DIAGNOSIS — E1165 Type 2 diabetes mellitus with hyperglycemia: Secondary | ICD-10-CM | POA: Diagnosis not present

## 2022-04-17 DIAGNOSIS — Z794 Long term (current) use of insulin: Secondary | ICD-10-CM | POA: Diagnosis not present

## 2022-04-23 ENCOUNTER — Encounter: Payer: Medicare Other | Admitting: Internal Medicine

## 2022-04-23 DIAGNOSIS — Z961 Presence of intraocular lens: Secondary | ICD-10-CM | POA: Diagnosis not present

## 2022-04-23 DIAGNOSIS — H43813 Vitreous degeneration, bilateral: Secondary | ICD-10-CM | POA: Diagnosis not present

## 2022-04-23 DIAGNOSIS — E119 Type 2 diabetes mellitus without complications: Secondary | ICD-10-CM | POA: Diagnosis not present

## 2022-04-23 DIAGNOSIS — H04123 Dry eye syndrome of bilateral lacrimal glands: Secondary | ICD-10-CM | POA: Diagnosis not present

## 2022-04-23 LAB — HM DIABETES EYE EXAM

## 2022-04-30 ENCOUNTER — Ambulatory Visit (INDEPENDENT_AMBULATORY_CARE_PROVIDER_SITE_OTHER): Payer: Medicare Other

## 2022-04-30 ENCOUNTER — Encounter: Payer: Self-pay | Admitting: Obstetrics and Gynecology

## 2022-04-30 ENCOUNTER — Encounter: Payer: Self-pay | Admitting: Internal Medicine

## 2022-04-30 ENCOUNTER — Other Ambulatory Visit (HOSPITAL_COMMUNITY)
Admission: RE | Admit: 2022-04-30 | Discharge: 2022-04-30 | Disposition: A | Discharge status: Home / Self Care | Payer: Medicare Other | Attending: Obstetrics and Gynecology | Admitting: Obstetrics and Gynecology

## 2022-04-30 ENCOUNTER — Ambulatory Visit (INDEPENDENT_AMBULATORY_CARE_PROVIDER_SITE_OTHER): Payer: Medicare Other | Admitting: Internal Medicine

## 2022-04-30 ENCOUNTER — Ambulatory Visit (INDEPENDENT_AMBULATORY_CARE_PROVIDER_SITE_OTHER): Payer: Medicare Other | Admitting: Obstetrics and Gynecology

## 2022-04-30 VITALS — BP 138/81 | HR 60 | Ht 65.0 in | Wt 172.0 lb

## 2022-04-30 VITALS — BP 110/64 | HR 77 | Temp 98.7°F | Ht 66.0 in | Wt 173.3 lb

## 2022-04-30 VITALS — BP 110/64 | HR 77 | Temp 98.7°F | Ht 66.0 in | Wt 173.2 lb

## 2022-04-30 DIAGNOSIS — Z794 Long term (current) use of insulin: Secondary | ICD-10-CM | POA: Diagnosis not present

## 2022-04-30 DIAGNOSIS — N182 Chronic kidney disease, stage 2 (mild): Secondary | ICD-10-CM | POA: Diagnosis not present

## 2022-04-30 DIAGNOSIS — N819 Female genital prolapse, unspecified: Secondary | ICD-10-CM

## 2022-04-30 DIAGNOSIS — R319 Hematuria, unspecified: Secondary | ICD-10-CM

## 2022-04-30 DIAGNOSIS — R82998 Other abnormal findings in urine: Secondary | ICD-10-CM | POA: Insufficient documentation

## 2022-04-30 DIAGNOSIS — I25119 Atherosclerotic heart disease of native coronary artery with unspecified angina pectoris: Secondary | ICD-10-CM

## 2022-04-30 DIAGNOSIS — I1310 Hypertensive heart and chronic kidney disease without heart failure, with stage 1 through stage 4 chronic kidney disease, or unspecified chronic kidney disease: Secondary | ICD-10-CM

## 2022-04-30 DIAGNOSIS — E7800 Pure hypercholesterolemia, unspecified: Secondary | ICD-10-CM

## 2022-04-30 DIAGNOSIS — R8290 Unspecified abnormal findings in urine: Secondary | ICD-10-CM | POA: Diagnosis not present

## 2022-04-30 DIAGNOSIS — Z0000 Encounter for general adult medical examination without abnormal findings: Secondary | ICD-10-CM

## 2022-04-30 DIAGNOSIS — N8110 Cystocele, unspecified: Secondary | ICD-10-CM | POA: Diagnosis not present

## 2022-04-30 DIAGNOSIS — E1122 Type 2 diabetes mellitus with diabetic chronic kidney disease: Secondary | ICD-10-CM

## 2022-04-30 DIAGNOSIS — Z6827 Body mass index (BMI) 27.0-27.9, adult: Secondary | ICD-10-CM

## 2022-04-30 DIAGNOSIS — R350 Frequency of micturition: Secondary | ICD-10-CM | POA: Diagnosis not present

## 2022-04-30 LAB — URINALYSIS, ROUTINE W REFLEX MICROSCOPIC
Bilirubin Urine: NEGATIVE
Glucose, UA: >=500 mg/dL — AB
Hgb urine dipstick: NEGATIVE
Ketones, ur: NEGATIVE mg/dL
Nitrite: NEGATIVE
Protein, ur: 100 mg/dL — AB
Specific Gravity, Urine: 1.027 (ref 1.005–1.030)
WBC, UA: >50 WBC/hpf — ABNORMAL HIGH (ref 0–5)
pH: 5 (ref 5.0–8.0)

## 2022-04-30 LAB — POCT URINALYSIS DIPSTICK
Bilirubin, UA: NEGATIVE
Glucose, UA: POSITIVE — AB
Ketones, UA: NEGATIVE
Nitrite, UA: NEGATIVE
Protein, UA: POSITIVE — AB
Spec Grav, UA: 1.02 (ref 1.010–1.025)
Urobilinogen, UA: 0.2 E.U./dL
pH, UA: 6 (ref 5.0–8.0)

## 2022-04-30 NOTE — Progress Notes (Signed)
Subjective:   [name] [name] is a 71 y.o. female who presents for Medicare Annual (Subsequent) preventive examination.  Review of Systems     Cardiac Risk Factors include: advanced age (>86men, >70 women);diabetes mellitus;dyslipidemia;hypertension     Objective:    Today's Vitals   04/30/22 1414  BP: 110/64  Pulse: 77  Temp: 98.7 F (37.1 C)  TempSrc: Oral  SpO2: 96%  Weight: 173 lb 3.2 oz (78.6 kg)  Height: 5\' 6"  (1.676 m)   Body mass index is 27.96 kg/m.     04/30/2022    2:27 PM 06/03/2021   12:02 PM 03/21/2021    9:26 AM 03/29/2020    9:56 AM 01/26/2020   11:37 AM 09/16/2019   10:02 AM 01/26/2019    9:11 AM  Advanced Directives  Does Patient Have a Medical Advance Directive? No No No No No No No  Would patient like information on creating a medical advance directive? Yes (MAU/Ambulatory/Procedural Areas - Information given) No - Patient declined No - Patient declined No - Patient declined No - Patient declined No - Patient declined No - Patient declined    Current Medications (verified) Outpatient Encounter Medications as of 04/30/2022  Medication Sig   amLODipine (NORVASC) 10 MG tablet TAKE 1 TABLET(10 MG) BY MOUTH DAILY   Ascorbic Acid (VITAMIN C) 500 MG CHEW 1 tablet   aspirin EC 81 MG tablet Take 81 mg by mouth daily. Swallow whole.   Cholecalciferol ([employer] VIT D 5000 HIGH-POTENCY PO) Take 1 tablet by mouth daily.   Coenzyme Q10 (COQ-10) 100 MG CAPS Take 1 tablet by mouth daily.    Continuous Blood Gluc Sensor (DEXCOM G7 SENSOR) MISC Use to check blood sugar 3 times a day. Dx code e11.65   FARXIGA 10 MG TABS tablet TAKE 1 TABLET(10 MG) BY MOUTH DAILY BEFORE BREAKFAST   Glucagon (GVOKE HYPOPEN 2-PACK) 0.5 MG/0.1ML SOAJ Use for blood sugars less than 60.   glucosamine-chondroitin 500-400 MG tablet Take 1 tablet by mouth daily. Reported on 12/17/2015   glucose blood (ONETOUCH VERIO) test strip CHECK BLOOD SUGAR THREE TIMES DAILY dx: e11.65   insulin lispro (HUMALOG  KWIKPEN) 100 UNIT/ML KwikPen Inject subcutaneously 2 units if sugars are 150 - 199, 4 units 200 -249, 6 units 250- 299, 8 units 300 - 349, 10 units >350   Insulin Pen Needle (BD PEN NEEDLE NANO 2ND GEN) 32G X 4 MM MISC USE TO INJECT INSULIN DAILY   MAGNESIUM OXIDE PO Take 400 mg by mouth daily.   metoprolol tartrate (LOPRESSOR) 50 MG tablet TAKE 1 TABLET BY MOUTH TWICE DAILY   Misc Natural Products (NEURIVA) CAPS Take 1 capsule by mouth daily.   nystatin (NYSTATIN) powder Apply topically 3 (three) times daily. As needed   omeprazole (PRILOSEC) 20 MG capsule TAKE 1 CAPSULE BY MOUTH DAILY   OneTouch Delica Lancets 99991111 MISC USE AS DIRECTED TO CHECK BLOOD SUGAR THREE TIMES DAILY   pregabalin (LYRICA) 50 MG capsule TAKE 1 CAPSULE(50 MG) BY MOUTH AT BEDTIME   rosuvastatin (CRESTOR) 20 MG tablet TAKE 1 TABLET(20 MG) BY MOUTH DAILY   traZODone (DESYREL) 50 MG tablet TAKE 1 TABLET BY MOUTH EVERY NIGHT AT BEDTIME   TRULICITY 1.5 0000000 SOPN ADMINISTER 1.5 MG UNDER THE SKIN 1 TIME A WEEK   Vitamin A 3 MG (10000 UT) TABS Take 1 tablet by mouth daily. 2,400 mcg   Vitamin D, Ergocalciferol, (DRISDOL) 1.25 MG (50000 UNIT) CAPS capsule TAKE 1 CAPSULE BY MOUTH EVERY 7 DAYS  vitamin E 600 UNIT capsule Take 400 Units by mouth daily.   No facility-administered encounter medications on file as of 04/30/2022.    Allergies (verified) Fish allergy, Other, and Latex   History: Past Medical History:  Diagnosis Date   Anemia    Carpal tunnel syndrome    Coronary artery disease    DES to ostial RCA 2005   Diabetes mellitus without complication (HCC)    No longer taking meds   Dyslipidemia    Hypertension    Osteoarthritis    PAD (peripheral artery disease) (HCC)    Sleep apnea    Past Surgical History:  Procedure Laterality Date   APPENDECTOMY     BREAST EXCISIONAL BIOPSY Right >10+ yrs ago   benign   CORONARY ANGIOPLASTY WITH STENT PLACEMENT     2006   heart stent     TONSILLECTOMY     WRIST  SURGERY     Family History  Problem Relation Age of Onset   CAD Father 17   Heart attack Father    CAD Mother 90   Heart attack Mother    CAD Brother    Cancer Brother        prostate   CAD Sister    Cancer Sister        breast   CAD Brother    Social History   Socioeconomic History   Marital status: Single    Spouse name: Not on file   Number of children: 2   Years of education: Not on file   Highest education level: Some college, no degree  Occupational History   Occupation: retired    Comment: disabled  Tobacco Use   Smoking status: Former    Packs/day: 0.50    Years: 30.00    Total pack years: 15.00    Types: Cigarettes    Quit date: 12/20/2013    Years since quitting: 8.3   Smokeless tobacco: Never  Vaping Use   Vaping Use: Never used  Substance and Sexual Activity   Alcohol use: Yes    Comment: rarely   Drug use: Not Currently    Comment: quit 2013   Sexual activity: Not Currently    Birth control/protection: None  Other Topics Concern   Not on file  Social History Narrative   Lives alone.   One son died in a car accident.     Coffee in am ,1 cup   Social Determinants of Health   Financial Resource Strain: Low Risk  (04/30/2022)   Overall Financial Resource Strain (CARDIA)    Difficulty of Paying Living Expenses: Not hard at all  Food Insecurity: No Food Insecurity (04/30/2022)   Hunger Vital Sign    Worried About Running Out of Food in the Last Year: Never true    Ran Out of Food in the Last Year: Never true  Transportation Needs: No Transportation Needs (04/30/2022)   PRAPARE - Administrator, Civil Service (Medical): No    Lack of Transportation (Non-Medical): No  Physical Activity: Inactive (04/30/2022)   Exercise Vital Sign    Days of Exercise per Week: 0 days    Minutes of Exercise per Session: 0 min  Stress: No Stress Concern Present (04/30/2022)   [employer] of Occupational Health - Occupational Stress Questionnaire     Feeling of Stress : Not at all  Social Connections: Unknown (09/09/2018)   Social Connection and Isolation Panel [NHANES]    Frequency of Communication with Friends and  Family: More than three times a week    Frequency of Social Gatherings with Friends and Family: More than three times a week    Attends Religious Services: More than 4 times per year    Active Member of [employer] or Organizations: Yes    Attends Music therapist: More than 4 times per year    Marital Status: Not on file    Tobacco Counseling Counseling given: Not Answered   Clinical Intake:  Pre-visit preparation completed: Yes  Pain : No/denies pain     Nutritional Status: BMI 25 -29 Overweight Nutritional Risks: None Diabetes: Yes  How often do you need to have someone help you when you read instructions, pamphlets, or other written materials from your doctor or pharmacy?: 1 - Never  Diabetic? Yes Nutrition Risk Assessment:  Has the patient had any N/V/D within the last 2 months?  No  Does the patient have any non-healing wounds?  No  Has the patient had any unintentional weight loss or weight gain?  No   Diabetes:  Is the patient diabetic?  Yes  If diabetic, was a CBG obtained today?  No  Did the patient bring in their glucometer from home?  No  How often do you monitor your CBG's? frquently.   Financial Strains and Diabetes Management:  Are you having any financial strains with the device, your supplies or your medication? No .  Does the patient want to be seen by Chronic Care Management for management of their diabetes?  No  Would the patient like to be referred to a Nutritionist or for Diabetic Management?  No   Diabetic Exams:  Diabetic Eye Exam: Completed 10/01/2021 Diabetic Foot Exam: Completed 04/10/2021  Interpreter Needed?: No  Information entered by :: NAllen LPN   Activities of Daily Living    04/30/2022    2:28 PM  In your present state of health, do you have any  difficulty performing the following activities:  Hearing? 0  Vision? 1  Comment a little blurry sometimes  Difficulty concentrating or making decisions? 0  Walking or climbing stairs? 0  Dressing or bathing? 0  Doing errands, shopping? 0  Preparing Food and eating ? N  Using the Toilet? N  In the past six months, have you accidently leaked urine? N  Comment if held to long  Do you have problems with loss of bowel control? N  Managing your Medications? N  Managing your Finances? N  Housekeeping or managing your Housekeeping? N    Patient Care Team: Glendale Chard, MD as PCP - General (Internal Medicine) Minus Breeding, MD as PCP - Cardiology (Cardiology) [facility]  Indicate any recent Medical Services you may have received from other than [facility] providers in the past year (date may be approximate).     Assessment:   This is a routine wellness examination for [place].  Hearing/Vision screen Vision Screening - Comments:: Regular eye exams, [facility]  Dietary issues and exercise activities discussed: Current Exercise Habits: The patient has a physically strenuous job, but has no regular exercise apart from work.   Goals Addressed             This Visit's Progress    Patient Stated       04/30/2022, wants to weigh 165 pounds       Depression Screen    04/30/2022    2:28 PM 01/08/2022    4:33 PM 08/22/2021    9:53 AM 03/21/2021  9:26 AM 01/08/2021   10:09 AM 09/27/2020   11:55 AM 07/04/2020   11:27 AM  PHQ 2/9 Scores  PHQ - 2 Score 0 0 0 0 0 0 0    Fall Risk    04/30/2022    2:27 PM 03/21/2021    9:26 AM 03/29/2020    9:57 AM 09/16/2019   10:02 AM 01/26/2019    9:11 AM  Fall Risk   Falls in the past year? 0 0 0 0 0  Number falls in past yr: 0      Injury with Fall? 0      Risk for fall due to : Medication side effect Medication side effect Medication side effect  Medication side effect  Follow up Falls prevention discussed;Education  provided;Falls evaluation completed Falls evaluation completed;Education provided;Falls prevention discussed Falls evaluation completed;Education provided;Falls prevention discussed  Falls evaluation completed;Falls prevention discussed    FALL RISK PREVENTION PERTAINING TO THE HOME:  Any stairs in or around the home? Yes  If so, are there any without handrails? No  Home free of loose throw rugs in walkways, pet beds, electrical cords, etc? Yes  Adequate lighting in your home to reduce risk of falls? Yes   ASSISTIVE DEVICES UTILIZED TO PREVENT FALLS:  Life alert? No  Use of a cane, walker or w/c? No  Grab bars in the bathroom? Yes  Shower chair or bench in shower? No  Elevated toilet seat or a handicapped toilet? No   TIMED UP AND GO:  Was the test performed? Yes .  Length of time to ambulate 10 feet: 5 sec.   Gait steady and fast without use of assistive device  Cognitive Function:        04/30/2022    2:29 PM 03/21/2021    9:29 AM 03/29/2020   10:00 AM 01/26/2019    9:20 AM  6CIT Screen  What Year? 0 points 0 points 0 points 0 points  What month? 0 points 0 points 0 points 0 points  What time? 0 points 0 points 0 points 0 points  Count back from 20 0 points 0 points 0 points 0 points  Months in reverse 0 points 0 points 0 points 0 points  Repeat phrase 2 points 0 points 2 points 0 points  Total Score 2 points 0 points 2 points 0 points    Immunizations Immunization History  Administered Date(s) Administered   Fluad Quad(high Dose 65+) 03/04/2019, 03/10/2022   Influenza, High Dose Seasonal PF 03/21/2017, 03/11/2018   Influenza-Unspecified 03/07/2020, 03/13/2021   PFIZER(Purple Top)SARS-COV-2 Vaccination 07/30/2019, 08/20/2019, 04/16/2020, 11/23/2020, 05/01/2021   PNEUMOCOCCAL CONJUGATE-20 04/27/2021   Pneumococcal Conjugate-13 10/23/2016   Tdap 12/15/2014   Unspecified SARS-COV-2 Vaccination 03/10/2022   Zoster Recombinat (Shingrix) 01/25/2019, 03/30/2019    TDAP  status: Up to date  Flu Vaccine status: Up to date  Pneumococcal vaccine status: Up to date  Covid-19 vaccine status: Completed vaccines  Qualifies for Shingles Vaccine? Yes   Zostavax completed Yes   Shingrix Completed?: Yes  Screening Tests Health Maintenance  Topic Date Due   Medicare Annual Wellness (AWV)  03/21/2022   Diabetic kidney evaluation - Urine ACR  04/10/2022   FOOT EXAM  04/10/2022   COVID-19 Vaccine (7 - Pfizer risk series) 05/05/2022   HEMOGLOBIN A1C  07/11/2022   OPHTHALMOLOGY EXAM  10/02/2022   Diabetic kidney evaluation - GFR measurement  01/09/2023   MAMMOGRAM  01/03/2024   TETANUS/TDAP  12/14/2024   COLONOSCOPY (Pts 45-37yrs Insurance coverage will  need to be confirmed)  10/11/2029   Pneumonia Vaccine 74+ Years old  Completed   INFLUENZA VACCINE  Completed   DEXA SCAN  Completed   Hepatitis C Screening  Completed   Zoster Vaccines- Shingrix  Completed   HPV VACCINES  Aged Out    Health Maintenance  Health Maintenance Due  Topic Date Due   Medicare Annual Wellness (AWV)  03/21/2022   Diabetic kidney evaluation - Urine ACR  04/10/2022   FOOT EXAM  04/10/2022    Colorectal cancer screening: scheduled for 05/21/2022  Mammogram status: Completed 01/02/2022. Repeat every year  Bone Density status: Completed 05/28/2010.   Lung Cancer Screening: (Low Dose CT Chest recommended if Age 72-80 years, 30 pack-year currently smoking OR have quit w/in 15years.) does not qualify.   Lung Cancer Screening Referral: no  Additional Screening:  Hepatitis C Screening: does qualify; Completed 10/25/2019  Vision Screening: Recommended annual ophthalmology exams for early detection of glaucoma and other disorders of the eye. Is the patient up to date with their annual eye exam?  Yes  Who is the provider or what is the name of the office in which the patient attends annual eye exams? [facility] If pt is not established with a provider, would they like to be  referred to a provider to establish care? No .   Dental Screening: Recommended annual dental exams for proper oral hygiene  Community Resource Referral / Chronic Care Management: CRR required this visit?  No   CCM required this visit?  No      Plan:     I have personally reviewed and noted the following in the patient's chart:   Medical and social history Use of alcohol, tobacco or illicit drugs  Current medications and supplements including opioid prescriptions. Patient is not currently taking opioid prescriptions. Functional ability and status Nutritional status Physical activity Advanced directives List of other physicians Hospitalizations, surgeries, and ER visits in previous 12 months Vitals Screenings to include cognitive, depression, and falls Referrals and appointments  In addition, I have reviewed and discussed with patient certain preventive protocols, quality metrics, and best practice recommendations. A written personalized care plan for preventive services as well as general preventive health recommendations were provided to patient.     Kellie Simmering, LPN   [id number]   Nurse Notes: none

## 2022-04-30 NOTE — Patient Instructions (Signed)
[name] [name] , Thank you for taking time to come for your Medicare Wellness Visit. I appreciate your ongoing commitment to your health goals. Please review the following plan we discussed and let me know if I can assist you in the future.   Screening recommendations/referrals: Colonoscopy: scheduled for 05/21/2022 Mammogram: completed 01/02/2022, due 01/04/2023 Bone Density: completed 05/28/2010 Recommended yearly ophthalmology/optometry visit for glaucoma screening and checkup Recommended yearly dental visit for hygiene and checkup  Vaccinations: Influenza vaccine: completed 03/10/2022 Pneumococcal vaccine: completed 04/27/2021 Tdap vaccine: completed 12/15/2014, due 12/14/2024 Shingles vaccine: completed   Covid-19: 05/01/2021, 11/23/2020, 04/16/2020, 08/20/2019, 07/30/2019  Advanced directives: Advance directive discussed with you today. I have provided a copy for you to complete at home and have notarized. Once this is complete please bring a copy in to our office so we can scan it into your chart.  Conditions/risks identified: none  Next appointment: Follow up in one year for your annual wellness visit    Preventive Care 71 Years and Older, Female Preventive care refers to lifestyle choices and visits with your health care provider that can promote health and wellness. What does preventive care include? A yearly physical exam. This is also called an annual well check. Dental exams once or twice a year. Routine eye exams. Ask your health care provider how often you should have your eyes checked. Personal lifestyle choices, including: Daily care of your teeth and gums. Regular physical activity. Eating a healthy diet. Avoiding tobacco and drug use. Limiting alcohol use. Practicing safe sex. Taking low-dose aspirin every day. Taking vitamin and mineral supplements as recommended by your health care provider. What happens during an annual well check? The services and screenings done by  your health care provider during your annual well check will depend on your age, overall health, lifestyle risk factors, and family history of disease. Counseling  Your health care provider may ask you questions about your: Alcohol use. Tobacco use. Drug use. Emotional well-being. Home and relationship well-being. Sexual activity. Eating habits. History of falls. Memory and ability to understand (cognition). Work and work Astronomer. Reproductive health. Screening  You may have the following tests or measurements: Height, weight, and BMI. Blood pressure. Lipid and cholesterol levels. These may be checked every 5 years, or more frequently if you are over 54 years old. Skin check. Lung cancer screening. You may have this screening every year starting at age 71 if you have a 30-pack-year history of smoking and currently smoke or have quit within the past 15 years. Fecal occult blood test (FOBT) of the stool. You may have this test every year starting at age 71 Flexible sigmoidoscopy or colonoscopy. You may have a sigmoidoscopy every 5 years or a colonoscopy every 10 years starting at age 38. Hepatitis C blood test. Hepatitis B blood test. Sexually transmitted disease (STD) testing. Diabetes screening. This is done by checking your blood sugar (glucose) after you have not eaten for a while (fasting). You may have this done every 1-3 years. Bone density scan. This is done to screen for osteoporosis. You may have this done starting at age 71 Mammogram. This may be done every 1-2 years. Talk to your health care provider about how often you should have regular mammograms. Talk with your health care provider about your test results, treatment options, and if necessary, the need for more tests. Vaccines  Your health care provider may recommend certain vaccines, such as: Influenza vaccine. This is recommended every year. Tetanus, diphtheria, and acellular pertussis (Tdap,  Td) vaccine. You  may need a Td booster every 10 years. Zoster vaccine. You may need this after age 82. Pneumococcal 13-valent conjugate (PCV13) vaccine. One dose is recommended after age 71 Pneumococcal polysaccharide (PPSV23) vaccine. One dose is recommended after age 71 Talk to your health care provider about which screenings and vaccines you need and how often you need them. This information is not intended to replace advice given to you by your health care provider. Make sure you discuss any questions you have with your health care provider. Document Released: 07/06/2015 Document Revised: 02/27/2016 Document Reviewed: 04/10/2015 Elsevier Interactive Patient Education  2017 [employer] Prevention in the Home Falls can cause injuries. They can happen to people of all ages. There are many things you can do to make your home safe and to help prevent falls. What can I do on the outside of my home? Regularly fix the edges of walkways and driveways and fix any cracks. Remove anything that might make you trip as you walk through a door, such as a raised step or threshold. Trim any bushes or trees on the path to your home. Use bright outdoor lighting. Clear any walking paths of anything that might make someone trip, such as rocks or tools. Regularly check to see if handrails are loose or broken. Make sure that both sides of any steps have handrails. Any raised decks and porches should have guardrails on the edges. Have any leaves, snow, or ice cleared regularly. Use sand or salt on walking paths during winter. Clean up any spills in your garage right away. This includes oil or grease spills. What can I do in the bathroom? Use night lights. Install grab bars by the toilet and in the tub and shower. Do not use towel bars as grab bars. Use non-skid mats or decals in the tub or shower. If you need to sit down in the shower, use a plastic, non-slip stool. Keep the floor dry. Clean up any water that spills  on the floor as soon as it happens. Remove soap buildup in the tub or shower regularly. Attach bath mats securely with double-sided non-slip rug tape. Do not have throw rugs and other things on the floor that can make you trip. What can I do in the bedroom? Use night lights. Make sure that you have a light by your bed that is easy to reach. Do not use any sheets or blankets that are too big for your bed. They should not hang down onto the floor. Have a firm chair that has side arms. You can use this for support while you get dressed. Do not have throw rugs and other things on the floor that can make you trip. What can I do in the kitchen? Clean up any spills right away. Avoid walking on wet floors. Keep items that you use a lot in easy-to-reach places. If you need to reach something above you, use a strong step stool that has a grab bar. Keep electrical cords out of the way. Do not use floor polish or wax that makes floors slippery. If you must use wax, use non-skid floor wax. Do not have throw rugs and other things on the floor that can make you trip. What can I do with my stairs? Do not leave any items on the stairs. Make sure that there are handrails on both sides of the stairs and use them. Fix handrails that are broken or loose. Make sure that handrails are as  long as the stairways. Check any carpeting to make sure that it is firmly attached to the stairs. Fix any carpet that is loose or worn. Avoid having throw rugs at the top or bottom of the stairs. If you do have throw rugs, attach them to the floor with carpet tape. Make sure that you have a light switch at the top of the stairs and the bottom of the stairs. If you do not have them, ask someone to add them for you. What else can I do to help prevent falls? Wear shoes that: Do not have high heels. Have rubber bottoms. Are comfortable and fit you well. Are closed at the toe. Do not wear sandals. If you use a stepladder: Make  sure that it is fully opened. Do not climb a closed stepladder. Make sure that both sides of the stepladder are locked into place. Ask someone to hold it for you, if possible. Clearly mark and make sure that you can see: Any grab bars or handrails. First and last steps. Where the edge of each step is. Use tools that help you move around (mobility aids) if they are needed. These include: Canes. Walkers. Scooters. Crutches. Turn on the lights when you go into a dark area. Replace any light bulbs as soon as they burn out. Set up your furniture so you have a clear path. Avoid moving your furniture around. If any of your floors are uneven, fix them. If there are any pets around you, be aware of where they are. Review your medicines with your doctor. Some medicines can make you feel dizzy. This can increase your chance of falling. Ask your doctor what other things that you can do to help prevent falls. This information is not intended to replace advice given to you by your health care provider. Make sure you discuss any questions you have with your health care provider. Document Released: 04/05/2009 Document Revised: 11/15/2015 Document Reviewed: 07/14/2014 Elsevier Interactive Patient Education  2017 [employer].

## 2022-04-30 NOTE — Progress Notes (Signed)
[name],acting as a Education administrator for [name], MD.,have documented all relevant documentation on the behalf of [name], MD,as directed by  [name], MD while in the presence of [name], MD.    Subjective:     Patient ID: [name] [name] , female    DOB: 04/01/1951 , 71 y.o.   MRN: [id number]   Chief Complaint  Patient presents with   Diabetes   Hypertension    HPI  Patient presents today for a diabetes/HTN check. Patient reports compliance with her meds. She denies headaches, chest pain and shortness of breath.   She also had AWV performed by [facility] today.   Diabetes She presents for her follow-up diabetic visit. She has type 2 diabetes mellitus. Her disease course has been improving. Pertinent negatives for hypoglycemia include no dizziness or headaches. Pertinent negatives for diabetes include no blurred vision, no chest pain, no polydipsia, no polyphagia and no polyuria. There are no hypoglycemic complications. Risk factors for coronary artery disease include diabetes mellitus, dyslipidemia, hypertension and post-menopausal. Current diabetic treatment includes insulin injections. She is compliant with treatment most of the time. She is following a diabetic diet. She participates in exercise intermittently. Eye exam is current.  Hypertension This is a chronic problem. The current episode started more than 1 year ago. The problem has been gradually improving since onset. The problem is controlled. Pertinent negatives include no blurred vision, chest pain, headaches or palpitations. Past treatments include angiotensin blockers. The current treatment provides moderate improvement. Compliance problems include exercise and diet.  Hypertensive end-organ damage includes kidney disease. Identifiable causes of hypertension include chronic renal disease.     Past Medical History:  Diagnosis Date   Anemia    Carpal tunnel syndrome    Coronary artery  disease    DES to ostial RCA 2005   Diabetes mellitus without complication (HCC)    No longer taking meds   Dyslipidemia    Hypertension    Osteoarthritis    PAD (peripheral artery disease) (HCC)    Sleep apnea      Family History  Problem Relation Age of Onset   CAD Father 11   Heart attack Father    CAD Mother 31   Heart attack Mother    CAD Brother    Cancer Brother        prostate   CAD Sister    Cancer Sister        breast   CAD Brother      Current Outpatient Medications:    amLODipine (NORVASC) 10 MG tablet, TAKE 1 TABLET(10 MG) BY MOUTH DAILY, Disp: 90 tablet, Rfl: 1   Ascorbic Acid (VITAMIN C) 500 MG CHEW, 1 tablet, Disp: , Rfl:    aspirin EC 81 MG tablet, Take 81 mg by mouth daily. Swallow whole., Disp: , Rfl:    FARXIGA 10 MG TABS tablet, TAKE 1 TABLET(10 MG) BY MOUTH DAILY BEFORE BREAKFAST, Disp: 30 tablet, Rfl: 1   Glucagon (GVOKE HYPOPEN 2-PACK) 0.5 MG/0.1ML SOAJ, Use for blood sugars less than 60., Disp: 0.2 mL, Rfl: 3   insulin lispro (HUMALOG KWIKPEN) 100 UNIT/ML KwikPen, Inject subcutaneously 2 units if sugars are 150 - 199, 4 units 200 -249, 6 units 250- 299, 8 units 300 - 349, 10 units >350, Disp: 15 mL, Rfl: 0   Insulin Pen Needle (BD PEN NEEDLE NANO 2ND GEN) 32G X 4 MM MISC, USE TO INJECT INSULIN DAILY, Disp: 100 each, Rfl: 2  metoprolol tartrate (LOPRESSOR) 50 MG tablet, TAKE 1 TABLET BY MOUTH TWICE DAILY, Disp: 180 tablet, Rfl: 4   nystatin (NYSTATIN) powder, Apply topically 3 (three) times daily. As needed, Disp: 60 g, Rfl: 0   omeprazole (PRILOSEC) 20 MG capsule, TAKE 1 CAPSULE BY MOUTH DAILY, Disp: 90 capsule, Rfl: 1   pregabalin (LYRICA) 50 MG capsule, TAKE 1 CAPSULE(50 MG) BY MOUTH AT BEDTIME, Disp: 90 capsule, Rfl: 1   rosuvastatin (CRESTOR) 20 MG tablet, TAKE 1 TABLET(20 MG) BY MOUTH DAILY, Disp: 90 tablet, Rfl: 3   traZODone (DESYREL) 50 MG tablet, TAKE 1 TABLET BY MOUTH EVERY NIGHT AT BEDTIME, Disp: 90 tablet, Rfl: 1   TRULICITY 1.5 GP/4.9IY  SOPN, ADMINISTER 1.5 MG UNDER THE SKIN 1 TIME A WEEK, Disp: 6 mL, Rfl: 3   Coenzyme Q10 (COQ-10) 100 MG CAPS, Take 1 tablet by mouth daily. , Disp: , Rfl:    Continuous Blood Gluc Sensor (DEXCOM G7 SENSOR) MISC, Use to check blood sugar 3 times a day. Dx code e11.65, Disp: 3 each, Rfl: 3   estradiol (ESTRACE) 0.1 MG/GM vaginal cream, Place 0.5 g vaginally 2 (two) times a week. Place 0.5g nightly for two weeks then twice a week after, Disp: 30 g, Rfl: 11   glucosamine-chondroitin 500-400 MG tablet, Take 1 tablet by mouth daily. Reported on 12/17/2015, Disp: , Rfl:    glucose blood (ONETOUCH VERIO) test strip, CHECK BLOOD SUGAR THREE TIMES DAILY dx: e11.65, Disp: 150 each, Rfl: 11   MAGNESIUM OXIDE PO, Take 400 mg by mouth daily., Disp: , Rfl:    Misc Natural Products (NEURIVA) CAPS, Take 1 capsule by mouth daily., Disp: , Rfl:    nitrofurantoin, macrocrystal-monohydrate, (MACROBID) 100 MG capsule, Take 1 capsule (100 mg total) by mouth 2 (two) times daily for 5 days., Disp: 10 capsule, Rfl: 0   OneTouch Delica Lancets 64B MISC, USE AS DIRECTED TO CHECK BLOOD SUGAR THREE TIMES DAILY, Disp: 100 each, Rfl: 0   Vitamin A 3 MG (10000 UT) TABS, Take 1 tablet by mouth daily. 2,400 mcg, Disp: , Rfl:    Vitamin D, Ergocalciferol, (DRISDOL) 1.25 MG (50000 UNIT) CAPS capsule, TAKE 1 CAPSULE BY MOUTH EVERY 7 DAYS, Disp: 12 capsule, Rfl: 1   vitamin E 600 UNIT capsule, Take 400 Units by mouth daily., Disp: , Rfl:    Allergies  Allergen Reactions   Fish Allergy     Cod   Other     Other reaction(s): hives   Latex Rash     Review of Systems  Constitutional: Negative.   Eyes: Negative.  Negative for blurred vision.  Respiratory: Negative.    Cardiovascular: Negative.  Negative for chest pain and palpitations.  Gastrointestinal: Negative.   Endocrine: Negative for polydipsia, polyphagia and polyuria.  Musculoskeletal: Negative.   Skin: Negative.   Neurological: Negative.  Negative for dizziness and  headaches.  Psychiatric/Behavioral: Negative.       Today's Vitals   04/30/22 1446  BP: 110/64  Pulse: 77  Temp: 98.7 F (37.1 C)  Weight: 173 lb 4.5 oz (78.6 kg)  Height: _0  (1.676 m)   Body mass index is 27.97 kg/m.  Wt Readings from Last 3 Encounters:  05/01/22 173 lb (78.5 kg)  04/30/22 173 lb 4.5 oz (78.6 kg)  04/30/22 173 lb 3.2 oz (78.6 kg)     Objective:  Physical Exam Vitals and nursing note reviewed.  Constitutional:      Appearance: Normal appearance.  HENT:  Head: Normocephalic and atraumatic.     Nose:     Comments: Masked     Mouth/Throat:     Comments: Masked  Eyes:     Extraocular Movements: Extraocular movements intact.  Cardiovascular:     Rate and Rhythm: Normal rate and regular rhythm.     Heart sounds: Normal heart sounds.  Pulmonary:     Effort: Pulmonary effort is normal.     Breath sounds: Normal breath sounds.  Musculoskeletal:     Cervical back: Normal range of motion.  Skin:    General: Skin is warm.  Neurological:     General: No focal deficit present.     Mental Status: She is alert.  Psychiatric:        Mood and Affect: Mood normal.        Behavior: Behavior normal.       Assessment And Plan:     1. Type 2 diabetes mellitus with stage 2 chronic kidney disease, with long-term current use of insulin (HCC) Comments: Chronic, I will check labs as below. Importance of dietary/medication/exercise compliance was d/w pt. She will c/w Farxiga 50VW and Trulicity 9.7XY weekly. - Lipid panel - BMP8+eGFR - CBC no Diff - Hemoglobin A1c - Microalbumin / Creatinine Urine Ratio  2. Hypertensive heart and renal disease with renal failure, stage 1 through stage 4 or unspecified chronic kidney disease, without heart failure Comments: Chronic, well controlled. No med changes. She is encouraged to follow low sodium diet. and to increase her exercise, aiming for 163mn/week. - Lipid panel  3. Atherosclerosis of native coronary artery of  native heart with angina pectoris ([place] Comments: Chronic, LDL goal <70. She will c/w statin therapy and ASA. - Lipid panel  4. Pure hypercholesterolemia Comments: Chronic, LDL goal <70.  She will c/w rosuvastatin 275mdaily for now. - Lipid panel  5. Abnormal urine Comments: Urine from GYN appt reviewed. I will send rx macrobid to take twice daily x 5 days.  6. Female genital prolapse, unspecified type Comments: Chronic, currently on vaginal estradiol as per UroGyn specialist. Pessary placed today.  7. BMI 27.0-27.9,adult Comments: She is encouraged to aim for at least 150 minutes of exercise per week.   Patient was given opportunity to ask questions. Patient verbalized understanding of the plan and was able to repeat key elements of the plan. All questions were answered to their satisfaction.   I, RoMaximino GreenlandMD, have reviewed all documentation for this visit. The documentation on 04/30/22 for the exam, diagnosis, procedures, and orders are all accurate and complete.   IF YOU HAVE BEEN REFERRED TO A SPECIALIST, IT MAY TAKE 1-2 WEEKS TO SCHEDULE/PROCESS THE REFERRAL. IF YOU HAVE NOT HEARD FROM US/SPECIALIST IN TWO WEEKS, PLEASE GIVE [place] CALL AT [phone] X [phone].   THE PATIENT IS ENCOURAGED TO PRACTICE SOCIAL DISTANCING DUE TO THE COVID-19 PANDEMIC.

## 2022-04-30 NOTE — Patient Instructions (Signed)

## 2022-04-30 NOTE — Progress Notes (Signed)
[facility]   Subjective:     Chief Complaint: No chief complaint on file.  History of Present Illness: Melyssa Signor is a 71 y.o. female with stage II pelvic organ prolapse and incomplete bladder emptying who presents today for a pessary fitting.   She is here today for a pessary fitting. We discussed she has some anterior vaginal prolapse which may be contributing to her incomplete bladder emptying, although not common with only stage II prolapse.    Past Medical History: Patient  has a past medical history of Anemia, Carpal tunnel syndrome, Coronary artery disease, Diabetes mellitus without complication (HCC), Dyslipidemia, Hypertension, Osteoarthritis, PAD (peripheral artery disease) (HCC), and Sleep apnea.   Past Surgical History: She  has a past surgical history that includes Wrist surgery; Tonsillectomy; Appendectomy; heart stent; Breast excisional biopsy (Right, >10+ yrs ago); and Coronary angioplasty with stent.   Medications: She has a current medication list which includes the following prescription(s): trazodone, amlodipine, vitamin c, aspirin ec, cholecalciferol, coq-10, dexcom g7 sensor, farxiga, gvoke hypopen 2-pack, glucosamine-chondroitin, glucose blood, insulin lispro, bd pen needle nano 2nd gen, magnesium oxide, metoprolol tartrate, neuriva, nystatin, omeprazole, onetouch delica lancets 33g, pregabalin, rosuvastatin, trulicity, vitamin a, vitamin d (ergocalciferol), and vitamin e.   Allergies: Patient is allergic to fish allergy, other, and latex.   Social History: Patient  reports that she quit smoking about 8 years ago. Her smoking use included cigarettes. She has a 15.00 pack-year smoking history. She has never used smokeless tobacco. She reports current alcohol use. She reports that she does not currently use drugs.      Objective:    BP 138/81 (BP Location: Left Arm, Patient Position: Sitting, Cuff Size: Normal)   Pulse 60   Ht 5\' 5"  (1.651 m)    Wt 172 lb (78 kg)   BMI 28.62 kg/m  Gen: No apparent distress, A&O x 3. Pelvic Exam: Normal external female genitalia; Bartholin's and Skene's glands normal in appearance; urethral meatus normal in appearance, no urethral masses or discharge.   A size #3 ring with support pessary was fitted. It was comfortable, stayed in place with valsalva and was an appropriate size on examination, with one finger fitting between the pessary and the vaginal walls. Lot  POP-Q (04/16/22)   -1                                            Aa   -1                                           Ba   -5.5                                              C    3                                            Gh   1.5  Pb   7.5                                            tvl    -2.5                                            Ap   -2.5                                            Bp   -7                                              D        Assessment/Plan:    Assessment: Ms. Muratalla is a 71 y.o. with stage II pelvic organ prolapse and incomplete bladder emptying who presents for a pessary fitting.  Plan: - She was fitted with a #3 ring with support pessary. She will keep the pessary in place until next visit.  - Has urodynamic testing scheduled in a few weeks. We discussed this will give [place] more information about her bladder emptying and incontinence symptoms.   All questions were answered.    Marguerita Beards, MD

## 2022-04-30 NOTE — Addendum Note (Signed)
Addended by: Salina April on: 04/30/2022 10:24 AM   Modules accepted: Orders

## 2022-05-01 ENCOUNTER — Encounter: Payer: Self-pay | Admitting: Obstetrics and Gynecology

## 2022-05-01 ENCOUNTER — Ambulatory Visit (INDEPENDENT_AMBULATORY_CARE_PROVIDER_SITE_OTHER): Payer: Medicare Other | Admitting: Obstetrics and Gynecology

## 2022-05-01 VITALS — BP 151/86 | HR 83 | Ht 66.0 in | Wt 173.0 lb

## 2022-05-01 DIAGNOSIS — N8110 Cystocele, unspecified: Secondary | ICD-10-CM

## 2022-05-01 DIAGNOSIS — N898 Other specified noninflammatory disorders of vagina: Secondary | ICD-10-CM

## 2022-05-01 MED ORDER — ESTRADIOL 0.1 MG/GM VA CREA: 0.5000 g | TOPICAL_CREAM | VAGINAL | 11 refills | Status: DC

## 2022-05-01 NOTE — Progress Notes (Signed)
[facility]   Subjective:     Chief Complaint: Vaginal Prolapse  History of Present Illness: Tabrina Esty is a 71 y.o. female with stage II pelvic organ prolapse who presents today for a pessary fitting.    Past Medical History: Patient  has a past medical history of Anemia, Carpal tunnel syndrome, Coronary artery disease, Diabetes mellitus without complication (HCC), Dyslipidemia, Hypertension, Osteoarthritis, PAD (peripheral artery disease) (HCC), and Sleep apnea.   Past Surgical History: She  has a past surgical history that includes Wrist surgery; Tonsillectomy; Appendectomy; heart stent; Breast excisional biopsy (Right, >10+ yrs ago); and Coronary angioplasty with stent.   Medications: She has a current medication list which includes the following prescription(s): estradiol, amlodipine, vitamin c, aspirin ec, coq-10, dexcom g7 sensor, farxiga, gvoke hypopen 2-pack, glucosamine-chondroitin, glucose blood, insulin lispro, bd pen needle nano 2nd gen, magnesium oxide, metoprolol tartrate, neuriva, nystatin, omeprazole, onetouch delica lancets 33g, pregabalin, rosuvastatin, trazodone, trulicity, vitamin a, vitamin d (ergocalciferol), and vitamin e.   Allergies: Patient is allergic to fish allergy, other, and latex.   Social History: Patient  reports that she quit smoking about 8 years ago. Her smoking use included cigarettes. She has a 15.00 pack-year smoking history. She has never used smokeless tobacco. She reports current alcohol use. She reports that she does not currently use drugs.      Objective:    BP (!) 151/86 (BP Location: Left Arm, Patient Position: Sitting, Cuff Size: Normal)   Pulse 83   Ht 5\' 6"  (1.676 m)   Wt 173 lb (78.5 kg)   BMI 27.92 kg/m  Gen: No apparent distress, A&O x 3. Pelvic Exam: Normal external female genitalia; Bartholin's and Skene's glands normal in appearance; urethral meatus normal in appearance, no urethral masses or discharge.  Patient had some vaginal irritation that she reports was exacerbated with the pessary. No sign of infectious process but patient's vaginal tissue was pale and dry and she reports sensitivity at the opening.   She was previously fitted with a 3# incontinence dish with support which came out with a bowel movement.    Today we attempted a size #4 incontinence dish with support, a #3 cube, #2 1/2 inch Gelhorn, and a #4 Ring with support. The size 4 ring seemed to be a good fit but came out in the toilet when patient simulated a bowel movement using a squatty potty stool in the bathroom.   A  #5 incontinence dish pessary was fitted. It was comfortable, stayed in place with valsalva and was an appropriate size on examination, with one finger fitting between the pessary and the vaginal walls. Patient was able to simulate a bowel movement using a squatty potty stool in the bathroom and urinate without pessary expulsion.    Assessment/Plan:    Assessment: Ms. Chisolm is a 71 y.o. with stage II pelvic organ prolapse who presents for a pessary fitting. Plan: She was fitted with a #5 incontinence dish with knob pessary. She will keep the pessary in place until next visit. She will use estrogen.   Patient was prescribed estrace cream today due to vaginal atrophy. She will use a pea sized amount on the fingertip applied into the vagina nightly for two weeks and then twice a week. Patient in agreement with this plan.   Follow-up in 3 weeks for a pessary check or sooner as needed.  All questions were answered.    62, NP

## 2022-05-01 NOTE — Patient Instructions (Signed)
Please use vaginal estrogen cream every night for 2 weeks. A pea sized amount every night into the vagina and around the urethra every night for 2 weeks. After the first two weeks you will do this 2 times a week.

## 2022-05-02 LAB — CBC
Hematocrit: 40.9 % (ref 34.0–46.6)
Hemoglobin: 12.5 g/dL (ref 11.1–15.9)
MCH: 22.6 pg — ABNORMAL LOW (ref 26.6–33.0)
MCHC: 30.6 g/dL — ABNORMAL LOW (ref 31.5–35.7)
MCV: 74 fL — ABNORMAL LOW (ref 79–97)
Platelets: 265 10*3/uL (ref 150–450)
RBC: 5.53 x10E6/uL — ABNORMAL HIGH (ref 3.77–5.28)
RDW: 16 % — ABNORMAL HIGH (ref 11.7–15.4)
WBC: 4.7 10*3/uL (ref 3.4–10.8)

## 2022-05-02 LAB — MICROALBUMIN / CREATININE URINE RATIO
Creatinine, Urine: 107.3 mg/dL
Microalb/Creat Ratio: 114 mg/g creat — ABNORMAL HIGH (ref 0–29)
Microalbumin, Urine: 122.1 ug/mL

## 2022-05-02 LAB — BMP8+EGFR
BUN/Creatinine Ratio: 17 (ref 12–28)
BUN: 14 mg/dL (ref 8–27)
CO2: 25 mmol/L (ref 20–29)
Calcium: 9.2 mg/dL (ref 8.7–10.3)
Chloride: 103 mmol/L (ref 96–106)
Creatinine, Ser: 0.84 mg/dL (ref 0.57–1.00)
Glucose: 78 mg/dL (ref 70–99)
Potassium: 4.2 mmol/L (ref 3.5–5.2)
Sodium: 146 mmol/L — ABNORMAL HIGH (ref 134–144)
eGFR: 74 mL/min/{1.73_m2} (ref >59)

## 2022-05-02 LAB — LIPID PANEL
Chol/HDL Ratio: 2.7 ratio (ref 0.0–4.4)
Cholesterol, Total: 157 mg/dL (ref 100–199)
HDL: 59 mg/dL (ref >39)
LDL Chol Calc (NIH): 75 mg/dL (ref 0–99)
Triglycerides: 135 mg/dL (ref 0–149)
VLDL Cholesterol Cal: 23 mg/dL (ref 5–40)

## 2022-05-02 LAB — HEMOGLOBIN A1C
Est. average glucose Bld gHb Est-mCnc: 157 mg/dL
Hgb A1c MFr Bld: 7.1 % — ABNORMAL HIGH (ref 4.8–5.6)

## 2022-05-02 LAB — URINE CULTURE: Culture: >=100000 — AB

## 2022-05-02 MED ORDER — NITROFURANTOIN MONOHYD MACRO 100 MG PO CAPS: 100.0000 mg | ORAL_CAPSULE | Freq: Two times a day (BID) | ORAL | 0 refills | Status: AC

## 2022-05-02 NOTE — Addendum Note (Signed)
Addended by: Marguerita Beards on: 05/02/2022 08:56 AM   Modules accepted: Orders

## 2022-05-08 ENCOUNTER — Telehealth: Payer: Self-pay

## 2022-05-08 NOTE — Chronic Care Management (AMB) (Signed)
    Called [name], No answer, left message of appointment on 05-13-2022 at 10:00 via telephone visit with Cherylin Mylar, Pharm D. Notified to have all medications, supplements, blood pressure and/or blood sugar logs available during appointment and to return call if need to reschedule.    Care Gaps: Yearly foot exam overdue Covid booster overdue  Star Rating Drug: Farxiga 10 mg- Last filled 04-29-2022 30 DS [facility] Trulicity 1.5 mg- Last filled 11-10-2021 84 DS [facility] Rosuvastatin 20 mg- Last filled 04-08-2022 90 DS [facility]  Any gaps in medications fill history? Yes  Huey Romans [facility] Clinical Pharmacist Assistant [phone]

## 2022-05-11 ENCOUNTER — Other Ambulatory Visit: Payer: Self-pay | Admitting: Internal Medicine

## 2022-05-12 ENCOUNTER — Other Ambulatory Visit: Payer: Self-pay | Admitting: Internal Medicine

## 2022-05-12 MED ORDER — PREGABALIN 50 MG PO CAPS: ORAL_CAPSULE | ORAL | 1 refills | Status: DC

## 2022-05-13 ENCOUNTER — Ambulatory Visit (INDEPENDENT_AMBULATORY_CARE_PROVIDER_SITE_OTHER): Payer: Medicare Other

## 2022-05-13 DIAGNOSIS — N182 Chronic kidney disease, stage 2 (mild): Secondary | ICD-10-CM

## 2022-05-13 DIAGNOSIS — I1310 Hypertensive heart and chronic kidney disease without heart failure, with stage 1 through stage 4 chronic kidney disease, or unspecified chronic kidney disease: Secondary | ICD-10-CM

## 2022-05-13 NOTE — Progress Notes (Signed)
Chronic Care Management Pharmacy Note  05/21/2022 Name:  [name] [name] MRN:  [id number] DOB:  1950-12-14  Summary: Patient reports that she has been doing well with her CGM equipment and is now able to use her DEXCOM.   Recommendations/Changes made from today's visit: Recommend patient receive COVID-19 Booster Recommend patient receive foot exam, she had an exam a couple months ago,  Recommend patient have BP check due to elevation at specialist appointment  Plan: Collaborated with PCP team patients BP appointment scheduled for 05/20/2022 at 11:45 AM patient made aware Patient reports that she will receive next COVID-19 Booster Diabetic foot exam already completed in September 2023, report available from Dr. Curt Jews office.    Subjective: [name] [name] is an 71 y.o. year old female who is a primary patient of Glendale Chard, MD.  The CCM team was consulted for assistance with disease management and care coordination needs.    Engaged with patient by telephone for follow up visit in response to provider referral for pharmacy case management and/or care coordination services.   Consent to Services:  The patient was given information about Chronic Care Management services, agreed to services, and gave verbal consent prior to initiation of services.  Please see initial visit note for detailed documentation.   Patient Care Team: Glendale Chard, MD as PCP - General (Internal Medicine) Minus Breeding, MD as PCP - Cardiology (Cardiology) [facility]  Recent office visits: 04/30/2022 Diabetes OV 01/08/2022 PCP OV   Recent consult visits: 05/01/2022 Urology OV  03/17/2022 [facility] visits: None in previous 6 months   Objective:  Lab Results  Component Value Date   CREATININE 0.84 04/30/2022   BUN 14 04/30/2022   EGFR 74 04/30/2022   GFRNONAA >60 06/03/2021   GFRAA 87 07/04/2020   NA 146 (H) 04/30/2022   K 4.2 04/30/2022   CALCIUM 9.2  04/30/2022   CO2 25 04/30/2022   GLUCOSE 78 04/30/2022    Lab Results  Component Value Date/Time   HGBA1C 7.1 (H) 04/30/2022 03:34 PM   HGBA1C 7.4 (H) 01/08/2022 04:49 PM   MICROALBUR 30 04/10/2021 05:02 PM   MICROALBUR 30 03/21/2021 10:30 AM    Last diabetic Eye exam:  Lab Results  Component Value Date/Time   HMDIABEYEEXA No Retinopathy 04/23/2022 12:00 AM    Last diabetic Foot exam: No results found for: "HMDIABFOOTEX"   Lab Results  Component Value Date   CHOL 157 04/30/2022   HDL 59 04/30/2022   LDLCALC 75 04/30/2022   TRIG 135 04/30/2022   CHOLHDL 2.7 04/30/2022       Latest Ref Rng & Units 01/08/2022    4:49 PM 06/03/2021   12:33 PM 04/10/2021    3:37 PM  Hepatic Function  Total Protein 6.0 - 8.5 g/dL 7.4  7.9  7.2   Albumin 3.8 - 4.8 g/dL 4.5  4.4  4.3   AST 0 - 40 IU/L _0 ALT 0 - 32 IU/L _1 Alk Phosphatase 44 - 121 IU/L 172  111  135   Total Bilirubin 0.0 - 1.2 mg/dL <0.2  0.8  0.3     Lab Results  Component Value Date/Time   TSH 1.160 04/10/2021 03:37 PM   TSH 0.467 09/19/2014 04:00 PM   FREET4 1.30 05/04/2009 06:20 AM       Latest Ref Rng & Units 04/30/2022    3:34 PM 06/03/2021   12:33  PM 04/10/2021    3:37 PM  CBC  WBC 3.4 - 10.8 x10E3/uL 4.7  5.3  5.8   Hemoglobin 11.1 - 15.9 g/dL 12.5  12.6  12.3   Hematocrit 34.0 - 46.6 % 40.9  41.2  39.1   Platelets 150 - 450 x10E3/uL 265  284  270     Lab Results  Component Value Date/Time   VD25OH 32.5 03/23/2019 10:48 AM   VD25OH 28.8 03/17/2018 12:00 AM   VITAMINB12 1,049 01/08/2021 10:55 AM   VITAMINB12 1,134 05/07/2020 03:26 PM    Clinical ASCVD: Yes  The 10-year ASCVD risk score (Arnett DK, et al., 2019) is: 37.7%   Values used to calculate the score:     Age: 71 years     Sex: Female     Is Non-Hispanic African American: Yes     Diabetic: Yes     Tobacco smoker: Yes     Systolic Blood Pressure: 599 mmHg     Is BP treated: Yes     HDL Cholesterol: 59 mg/dL      Total Cholesterol: 157 mg/dL       04/30/2022    2:28 PM 01/08/2022    4:33 PM 08/22/2021    9:53 AM  Depression screen PHQ 2/9  Decreased Interest 0 0 0  Down, Depressed, Hopeless 0 0 0  PHQ - 2 Score 0 0 0     Social History   Tobacco Use  Smoking Status Former   Packs/day: 0.50   Years: 30.00   Total pack years: 15.00   Types: Cigarettes   Quit date: 12/20/2013   Years since quitting: 8.4  Smokeless Tobacco Never   BP Readings from Last 3 Encounters:  05/20/22 122/86  05/01/22 (!) 151/86  04/30/22 110/64   Pulse Readings from Last 3 Encounters:  05/20/22 94  05/01/22 83  04/30/22 77   Wt Readings from Last 3 Encounters:  05/20/22 173 lb (78.5 kg)  05/01/22 173 lb (78.5 kg)  04/30/22 173 lb 4.5 oz (78.6 kg)   BMI Readings from Last 3 Encounters:  05/20/22 27.92 kg/m  05/01/22 27.92 kg/m  04/30/22 27.97 kg/m    Assessment/Interventions: Review of patient past medical history, allergies, medications, health status, including review of consultants reports, laboratory and other test data, was performed as part of comprehensive evaluation and provision of chronic care management services.   SDOH:  (Social Determinants of Health) assessments and interventions performed: No SDOH Interventions    Flowsheet Row Office Visit from 04/30/2022 in [facility] Most recent reading at 04/30/2022  3:18 PM Clinical Support from 04/30/2022 in [place] Most recent reading at 04/30/2022  2:26 PM Office Visit from 01/08/2022 in [place] Most recent reading at 01/08/2022  4:33 PM Office Visit from 11/19/2021 in [place] Most recent reading at 11/30/2021 12:27 PM Office Visit from 08/22/2021 in [place] Most recent reading at 08/22/2021  9:53 AM Office Visit from 05/28/2021 in [place] Most recent reading at 05/28/2021 10:53 AM  SDOH Interventions         Food Insecurity Interventions -- Intervention Not Indicated -- -- -- --  Transportation Interventions -- Intervention Not Indicated -- -- -- --  Depression Interventions/Treatment  PHQ2-9 Score <4 Follow-up Not Indicated -- PHQ2-9 Score <4 Follow-up Not Indicated PHQ2-9 Score <4 Follow-up Not Indicated PHQ2-9 Score <4 Follow-up Not Indicated PHQ2-9 Score <4 Follow-up Not Indicated  Financial Strain Interventions --  Intervention Not Indicated -- -- -- --  Physical Activity Interventions -- Patient Refused, Other (Comments) -- -- -- --  Stress Interventions -- Intervention Not Indicated -- -- -- --      SDOH Screenings   Food Insecurity: No Food Insecurity (04/30/2022)  Housing: Medium Risk (09/09/2018)  Transportation Needs: No Transportation Needs (04/30/2022)  Alcohol Screen: Low Risk  (09/09/2018)  Depression (PHQ2-9): Low Risk  (04/30/2022)  Financial Resource Strain: Low Risk  (04/30/2022)  Physical Activity: Inactive (04/30/2022)  Social Connections: Unknown (09/09/2018)  Stress: No Stress Concern Present (04/30/2022)  Tobacco Use: Medium Risk (05/20/2022)    Arthur   Other     Other reaction(s): hives   Latex Rash    Medications Reviewed Today     Reviewed by Debbora Dus, CMA (Certified Medical Assistant) on 05/20/22 at 1201  Med List Status: <None>   Medication Order Taking? Sig Documenting Provider Last Dose Status Informant  amLODipine (NORVASC) 10 MG tablet [id number] Yes TAKE 1 TABLET(10 MG) BY MOUTH DAILY Glendale Chard, MD Taking Active   Ascorbic Acid (VITAMIN C) 500 MG CHEW [id number] Yes 1 tablet [provider] Taking Active   aspirin EC 81 MG tablet [id number] Yes Take 81 mg by mouth daily. Swallow whole. [provider] Taking Active   Coenzyme Q10 (COQ-10) 100 MG CAPS [id number] Yes Take 1 tablet by mouth daily.  [provider] Taking Active   Continuous Blood  Gluc Sensor ([place]) [place] [id number] Yes Use to check blood sugar 3 times a day. Dx code e11.65 Glendale Chard, MD Taking Active   estradiol (ESTRACE) 0.1 MG/GM vaginal cream [id number] Yes Place 0.5 g vaginally 2 (two) times a week. Place 0.5g nightly for two weeks then twice a week after Berton Mount, NP Taking Active   FARXIGA 10 MG TABS tablet [id number] Yes TAKE 1 TABLET(10 MG) BY MOUTH DAILY BEFORE Adella Nissen, MD Taking Active   Glucagon (GVOKE HYPOPEN 2-PACK) 0.5 MG/0.1ML SOAJ [id number] Yes Use for blood sugars less than 60. Glendale Chard, MD Taking Active   glucosamine-chondroitin 500-400 MG tablet [id number] Yes Take 1 tablet by mouth daily. Reported on 12/17/2015 [provider] Taking Active   glucose blood (ONETOUCH VERIO) test strip [id number] Yes CHECK BLOOD SUGAR THREE TIMES DAILY dx: e11.65 Glendale Chard, MD Taking Active   insulin lispro (HUMALOG KWIKPEN) 100 UNIT/ML KwikPen [id number] Yes Inject subcutaneously 2 units if sugars are 150 - 199, 4 units 200 -249, 6 units 250- 299, 8 units 300 - 349, 10 units >350 Glendale Chard, MD Taking Active   Insulin Pen Needle (BD PEN NEEDLE NANO 2ND GEN) 32G X 4 MM MISC [id number] Yes USE TO INJECT INSULIN DAILY Glendale Chard, MD Taking Active   MAGNESIUM OXIDE PO [id number] Yes Take 400 mg by mouth daily. [provider] Taking Active Self  metoprolol tartrate (LOPRESSOR) 50 MG tablet [id number] Yes TAKE 1 TABLET BY MOUTH TWICE DAILY Glendale Chard, MD Taking Active   Misc Natural Products ([place]) CAPS [id number] Yes Take 1 capsule by mouth daily. [provider] Taking Active Self  nystatin (NYSTATIN) powder [id number] Yes Apply topically 3 (three) times daily. As needed Glendale Chard, MD Taking Active   omeprazole (PRILOSEC) 20 MG capsule [id number] Yes TAKE 1 CAPSULE BY MOUTH DAILY Glendale Chard, MD Taking Active   OneTouch Delica Lancets 78H [place] [id number] Yes USE AS DIRECTED TO CHECK  BLOOD  SUGAR THREE TIMES DAILY Glendale Chard, MD Taking Active   pregabalin (LYRICA) 50 MG capsule [id number] Yes TAKE 1 CAPSULE(50 MG) BY MOUTH AT BEDTIME Glendale Chard, MD Taking Active   rosuvastatin (CRESTOR) 20 MG tablet [id number] Yes TAKE 1 TABLET(20 MG) BY MOUTH DAILY Minus Breeding, MD Taking Active   traZODone (DESYREL) 50 MG tablet [id number] Yes TAKE 1 TABLET BY MOUTH EVERY NIGHT AT BEDTIME Glendale Chard, MD Taking Active   TRULICITY 1.5 SK/8.7GO [place] Aid [id number] Yes ADMINISTER 1.5 MG UNDER THE SKIN 1 TIME A Dayna Barker, MD Taking Active   Vitamin A 3 MG (10000 UT) TABS [id number] Yes Take 1 tablet by mouth daily. 2,400 mcg [provider] Taking Active Self  Vitamin D, Ergocalciferol, (DRISDOL) 1.25 MG (50000 UNIT) CAPS capsule [id number] Yes TAKE 1 CAPSULE BY MOUTH EVERY 7 DAYS Glendale Chard, MD Taking Active   vitamin E 600 UNIT capsule [id number] Yes Take 400 Units by mouth daily. [provider] Taking Active             Patient Active Problem List   Diagnosis Date Noted   Pure hypercholesterolemia 04/30/2022   Female genital prolapse 04/30/2022   Constipation 12/16/2021   Pelvic relaxation 10/17/2021   Parenchymal renal hypertension 07/04/2020   Educated about COVID-19 virus infection 03/25/2020   OSA and COPD overlap syndrome ([place]) 03/14/2019   Snoring 02/17/2019   Peripheral arterial disease ([place]) 01/26/2019   Hepatitis C carrier ([place]) 01/26/2019   Hypertensive heart and renal disease 08/19/2018   Class 1 obesity due to excess calories with serious comorbidity and body mass index (BMI) of 33.0 to 33.9 in adult 08/19/2018   Paresthesia of left upper extremity 06/29/2018   Atherosclerosis of native coronary artery of native heart with angina pectoris ([place]) 04/09/2018   Dyslipidemia 04/09/2018   Primary osteoarthritis of both feet 11/05/2017   Primary osteoarthritis of both hands 10/30/2017   Primary osteoarthritis of both knees 10/30/2017   Primary  insomnia 10/30/2017   Family history of lupus erythematosus 10/30/2017   Dyspnea on exertion 12/10/2016   Type 2 diabetes mellitus with stage 2 chronic kidney disease, with long-term current use of insulin ([place]) 09/21/2014   Acute cystitis 09/21/2014   Hyperglycemia 09/19/2014   Bradycardia 09/19/2014   Hyponatremia 09/19/2014   Elevated alkaline phosphatase level 09/19/2014   Acute kidney injury ([place])    Essential hypertension, benign 07/05/2008   Coronary atherosclerosis 07/05/2008    Immunization History  Administered Date(s) Administered   Fluad Quad(high Dose 65+) 03/04/2019, 03/10/2022   Influenza, High Dose Seasonal PF 03/21/2017, 03/11/2018   Influenza-Unspecified 03/07/2020, 03/13/2021   PFIZER(Purple Top)SARS-COV-2 Vaccination 07/30/2019, 08/20/2019, 04/16/2020, 11/23/2020, 05/01/2021   PNEUMOCOCCAL CONJUGATE-20 04/27/2021   Pneumococcal Conjugate-13 10/23/2016   Tdap 12/15/2014   Unspecified SARS-COV-2 Vaccination 03/10/2022   Zoster Recombinat (Shingrix) 01/25/2019, 03/30/2019    Conditions to be addressed/monitored:  Hypertension and Diabetes  Current Barriers:  Unable to independently monitor therapeutic efficacy  Pharmacist Clinical Goal(s):  Patient will achieve adherence to monitoring guidelines and medication adherence to achieve therapeutic efficacy through collaboration with PharmD and provider.   Interventions: 1:1 collaboration with Glendale Chard, MD regarding development and update of comprehensive plan of care as evidenced by provider attestation and co-signature Inter-disciplinary care team collaboration (see longitudinal plan of care) Comprehensive medication review performed; medication list updated in electronic medical record  Hypertension (BP goal <130/80) -Controlled -Current treatment: Metoprolol tartrate 50 mg tablet twice per day. Appropriate, Effective, Safe, Accessible Amlodipine 10 mg  tablet once per day Appropriate, Effective, Safe,  Accessible -Current home readings: Ms. Carte reports that her readings are good, 151/86 at the urologist  -Current dietary habits: she does not use any salt in any food. She also only use only fresh vegetables. She is only using Natures seasoning by Eden Lathe that does not have any salt. She rarely eats with any pork  -Current exercise habits: please see diabetes -Denies hypotensive/hypertensive symptoms -Educated on BP goals and benefits of medications for prevention of heart attack, stroke and kidney damage; Importance of home blood pressure monitoring; Proper BP monitoring technique; -Discussed patients elevated BP reading at the urologist on 05/01/2022 of 151/86.  -Counseled to monitor BP at home at least once per day, document, and provide log at future appointments -Recommended to continue current medication  -Recommend that patient have a  BP check completed    Diabetes (A1c goal <7%) -Not ideally controlled -Current medications: Humalog Kwikpen 100 unit/ml - using with sliding scale Appropriate, Effective, Safe, Accessible The last 3-4  months only used 3-4 times at the most  Insulin lispro - Inject subcutaneously 2 units if sugars are 150 - 199, 4 units 200 -249, 6 units 250- 299, 8 units 300 - 349, 10 units >350 Appropriate, Effective, Safe, Accessible Farxiga 10 mg tablet by mouth daily for breakfast Appropriate, Effective, Safe, Accessible GVOKE Hypopen 0.5 mg - use for blood sugars less than 60 Appropriate, Effective, Safe, Accessible -Current home glucose readings fasting glucose: 101, 95  post prandial glucose: 150, 160  -Denies hypoglycemic/hyperglycemic symptoms -Current meal patterns:  breakfast: : vegetable beef stew with ritz crackers   lunch: : vegetable beef stew with ritz crackers   drinks: coffee, plenty of water, she has a silver cup that is filled -she is drinking at least 8 bottles of water per day  -Current exercise: she is always ripping and running, she is  also walking through grocery stores and department stores. She rarely sits around and does nothing. She is very active with the holiday season. She is exercising at least 2 hours per day, because of her current cooking schedule, she is not going to the [facility].  -Educated on Prevention and management of hypoglycemic episodes; Continuous glucose monitoring; -She is now able to check her BS using the Dexcom and  -Counseled to check feet daily and get yearly eye exams -Recommended to continue current medication  Patient Goals/Self-Care Activities Patient will:  - take medications as prescribed as evidenced by patient report and record review  Follow Up Plan: The patient has been provided with contact information for the care management team and has been advised to call with any health related questions or concerns.    Medication Assistance: None required.  Patient affirms current coverage meets needs.  Compliance/Adherence/Medication fill history: Care Gaps: Foot Exam completed about two months ago, will make sure its documented in the chart - review from September - Dr. Estrella Myrtle Drugs: Wilder Glade 10 mg tablet  Trulicity 1.5 YI/9.4 ml Rosuvastatin 20 mg tablet    Patient's preferred pharmacy is:  [facility] [place], [address] [place] [address] Phone: [phone] Fax: [phone]  [place], [address] [place] [address] Phone: [phone] Fax: [phone]  Uses pill box? Yes Pt endorses 95% compliance  We discussed: Benefits of medication synchronization, packaging and  delivery as well as enhanced pharmacist oversight with [employer]. Patient decided to: Continue current medication management strategy  Care Plan and Follow Up Patient Decision:  Patient agrees to Care Plan and Follow-up.  Plan: The  patient has been provided with contact information for the care management team and has been advised to call with any health related questions or concerns.   Orlando Penner, CPP, PharmD Clinical Pharmacist Practitioner [facility] [phone]

## 2022-05-17 DIAGNOSIS — Z794 Long term (current) use of insulin: Secondary | ICD-10-CM | POA: Diagnosis not present

## 2022-05-17 DIAGNOSIS — E1165 Type 2 diabetes mellitus with hyperglycemia: Secondary | ICD-10-CM | POA: Diagnosis not present

## 2022-05-20 ENCOUNTER — Encounter: Payer: Self-pay | Admitting: Internal Medicine

## 2022-05-20 ENCOUNTER — Ambulatory Visit: Payer: Medicare Other

## 2022-05-20 VITALS — BP 122/86 | HR 94 | Temp 98.1°F | Ht 66.0 in | Wt 173.0 lb

## 2022-05-20 DIAGNOSIS — I1310 Hypertensive heart and chronic kidney disease without heart failure, with stage 1 through stage 4 chronic kidney disease, or unspecified chronic kidney disease: Secondary | ICD-10-CM

## 2022-05-20 NOTE — Progress Notes (Signed)
Pt presents today for bpc. She currently takes amlodipine 10mg  in the morning. She did take medication before coming in today. She reports drinking  5-8 bottles of water a day. Patient denies headache, chest pain, SOB, blurred vision. She adds, being busy all morning.   BP Readings from Last 3 Encounters:  05/20/22 122/86  05/01/22 (!) 151/86  04/30/22 110/64  Patient notified, patient aware to cut back on salt & increase exercise. [facility]

## 2022-05-21 NOTE — Patient Instructions (Addendum)
Visit Information It was great speaking with you today!  Please let me know if you have any questions about our visit.   Goals Addressed             This Visit's Progress    Manage My Medicine       Timeframe:  Long-Range Goal Priority:  High Start Date:        01/09/2021                     Expected End Date:                       Follow Up Date 09/23/2022  In Progress:   - call for medicine refill 2 or 3 days before it runs out - call if I am sick and can't take my medicine - keep a list of all the medicines I take; vitamins and herbals too - use a pillbox to sort medicine - use an alarm clock or phone to remind me to take my medicine    Why is this important?   These steps will help you keep on track with your medicines.   Notes:  Please call if you have any questions.         Patient Care Plan: CCM Pharmacy Care Plan     Problem Identified: HTN, DM II   Priority: High     Long-Range Goal: Disease Management   Note:   Current Barriers:  Unable to independently monitor therapeutic efficacy  Pharmacist Clinical Goal(s):  Patient will achieve adherence to monitoring guidelines and medication adherence to achieve therapeutic efficacy through collaboration with PharmD and provider.   Interventions: 1:1 collaboration with [name], MD regarding development and update of comprehensive plan of care as evidenced by provider attestation and co-signature Inter-disciplinary care team collaboration (see longitudinal plan of care) Comprehensive medication review performed; medication list updated in electronic medical record  Hypertension (BP goal <130/80) -Controlled -Current treatment: Metoprolol tartrate 50 mg tablet twice per day. Appropriate, Effective, Safe, Accessible Amlodipine 10 mg tablet once per day Appropriate, Effective, Safe, Accessible -Current home readings: Ms. Leonhardt reports that her readings are good, 151/86 at the urologist  -Current  dietary habits: she does not use any salt in any food. She also only use only fresh vegetables. She is only using Natures seasoning by Gwenevere Abbot that does not have any salt. She rarely eats with any pork  -Current exercise habits: please see diabetes -Denies hypotensive/hypertensive symptoms -Educated on BP goals and benefits of medications for prevention of heart attack, stroke and kidney damage; Importance of home blood pressure monitoring; Proper BP monitoring technique; -Discussed patients elevated BP reading at the urologist on 05/01/2022 of 151/86.  -Counseled to monitor BP at home at least once per day, document, and provide log at future appointments -Recommended to continue current medication  -Recommend that patient have a  BP check completed    Diabetes (A1c goal <7%) -Not ideally controlled -Current medications: Humalog Kwikpen 100 unit/ml - using with sliding scale Appropriate, Effective, Safe, Accessible The last 3-4  months only used 3-4 times at the most  Insulin lispro - Inject subcutaneously 2 units if sugars are 150 - 199, 4 units 200 -249, 6 units 250- 299, 8 units 300 - 349, 10 units >350 Appropriate, Effective, Safe, Accessible Farxiga 10 mg tablet by mouth daily for breakfast Appropriate, Effective, Safe, Accessible GVOKE Hypopen 0.5 mg - use for blood sugars less than 60  Appropriate, Effective, Safe, Accessible -Current home glucose readings fasting glucose: 101, 95  post prandial glucose: 150, 160  -Denies hypoglycemic/hyperglycemic symptoms -Current meal patterns:  breakfast: : vegetable beef stew with ritz crackers   lunch: : vegetable beef stew with ritz crackers   drinks: coffee, plenty of water, she has a silver cup that is filled -she is drinking at least 8 bottles of water per day  -Current exercise: she is always ripping and running, she is also walking through grocery stores and department stores. She rarely sits around and does nothing. She is very active  with the holiday season. She is exercising at least 2 hours per day, because of her current cooking schedule, she is not going to the [facility].  -Educated on Prevention and management of hypoglycemic episodes; Continuous glucose monitoring; -She is now able to check her BS using the Dexcom and  -Counseled to check feet daily and get yearly eye exams -Recommended to continue current medication  Patient Goals/Self-Care Activities Patient will:  - take medications as prescribed as evidenced by patient report and record review  Follow Up Plan: The patient has been provided with contact information for the care management team and has been advised to call with any health related questions or concerns.      Patient agreed to services and verbal consent obtained.   The patient verbalized understanding of instructions, educational materials, and care plan provided today and agreed to receive a mailed copy of patient instructions, educational materials, and care plan.   Cherylin Mylar, PharmD Clinical Pharmacist [facility] [phone]

## 2022-05-22 ENCOUNTER — Ambulatory Visit: Payer: Medicare Other | Admitting: Internal Medicine

## 2022-05-22 DIAGNOSIS — N182 Chronic kidney disease, stage 2 (mild): Secondary | ICD-10-CM

## 2022-05-22 DIAGNOSIS — I1310 Hypertensive heart and chronic kidney disease without heart failure, with stage 1 through stage 4 chronic kidney disease, or unspecified chronic kidney disease: Secondary | ICD-10-CM

## 2022-05-22 DIAGNOSIS — Z794 Long term (current) use of insulin: Secondary | ICD-10-CM

## 2022-05-22 DIAGNOSIS — E1122 Type 2 diabetes mellitus with diabetic chronic kidney disease: Secondary | ICD-10-CM

## 2022-05-26 NOTE — Progress Notes (Addendum)
[facility] Urodynamics Procedure  Referring Physician: [name], MD Date of Procedure: 05/28/2022  Rieley Khalsa is a 71 y.o. female who presents for urodynamic evaluation. Indication(s) for study: mixed incontinence and female prolapse  Vital Signs: BP 134/80   Pulse (!) 57   Wt 173 lb (78.5 kg)   BMI 27.92 kg/m   Laboratory Results: A catheterized urine specimen revealed:  POC urine: Positive for glucose and protein, all other components negative   Voiding Diary: Not done  Procedure Timeout:  The correct patient was verified and the correct procedure was verified. The patient was in the correct position and safety precautions were reviewed based on at the patient's history.  Urodynamic Procedure A 642F dual lumen urodynamics catheter was placed under sterile conditions into the patient's bladder. A 642F catheter was placed into the rectum in order to measure abdominal pressure. EMG patches were placed in the appropriate position.  All connections were confirmed and calibrations/adjusted made. Saline was instilled into the bladder through the dual lumen catheters.  Cough/valsalva pressures were measured periodically during filling.  Patient was allowed to void.  The bladder was then emptied of its residual.  UROFLOW: Revealed a Qmax of 4.5 mL/sec.  She voided 58.2 mL and had a residual of 250 mL.  It was a normal pattern and represented normal habits though interpretation limited due to low voided volume.  CMG: This was performed with sterile water in the sitting position at a fill rate of 30 mL/min.    First sensation of fullness was 50 mLs,  First urge was 70 mLs,  Strong urge was 254 mLs and  Capacity was 565 mLs  Stress incontinence was demonstrated Highest negative CLPP was 114 cmH20 at 400 ml. Highest negative VLPP was 55 cmH20at 400 ml. Lowest positive Barrier CLPP was 108 cmH20 at 400 ml while standing.    Detrusor function was overactive, with  phasic contractions seen at capacity  Compliance:  Borderline normal. End fill detrusor pressure was 9.3cmH20.  Calculated compliance was 48mL/cmH20  UPP: MUCP with barrier reduction was 17 cm of water.    MICTURITION STUDY: Voiding was performed with reduction using scopettes in the sitting position.  Pdet at Qmax was 24.6 cm of water.  Qmax was 11.5 mL/sec.  It was a intermittent pattern.  She voided 140 mL and had a residual of 425 mL.  It was a volitional void, sustained detrusor contraction was present and abdominal straining was present  EMG: This was performed with patches.  She had voluntary contractions, recruitment with fill was present and urethral sphincter was not relaxed with void. Increased EMG activity with void.  The details of the procedure with the study tracings have been scanned into EPIC.   Urodynamic Impression:  1. Sensation was increased; capacity was normal 2. Stress Incontinence was demonstrated at ISD range pressures; 3. Detrusor Overactivity was demonstrated at capacity 4. Emptying was dysfunctional with a elevated PVR, a sustained detrusor contraction present,  abdominal straining not present, dyssynergic urethral sphincter activity on EMG.  Plan: - The patient will follow up  to discuss the findings and treatment options.

## 2022-05-28 ENCOUNTER — Ambulatory Visit (INDEPENDENT_AMBULATORY_CARE_PROVIDER_SITE_OTHER): Payer: Medicare Other | Admitting: Obstetrics and Gynecology

## 2022-05-28 ENCOUNTER — Encounter: Payer: Self-pay | Admitting: Obstetrics and Gynecology

## 2022-05-28 VITALS — BP 134/80 | HR 57 | Wt 173.0 lb

## 2022-05-28 DIAGNOSIS — N3941 Urge incontinence: Secondary | ICD-10-CM

## 2022-05-28 DIAGNOSIS — R350 Frequency of micturition: Secondary | ICD-10-CM | POA: Diagnosis not present

## 2022-05-28 DIAGNOSIS — R339 Retention of urine, unspecified: Secondary | ICD-10-CM

## 2022-05-28 DIAGNOSIS — N812 Incomplete uterovaginal prolapse: Secondary | ICD-10-CM | POA: Diagnosis not present

## 2022-05-28 LAB — POCT URINALYSIS DIPSTICK
Bilirubin, UA: NEGATIVE
Blood, UA: NEGATIVE
Glucose, UA: POSITIVE — AB
Ketones, UA: NEGATIVE
Leukocytes, UA: NEGATIVE
Nitrite, UA: NEGATIVE
Protein, UA: POSITIVE — AB
Spec Grav, UA: >=1.03 — AB (ref 1.010–1.025)
Urobilinogen, UA: 0.2 E.U./dL
pH, UA: 6 (ref 5.0–8.0)

## 2022-05-28 NOTE — Patient Instructions (Signed)
Taking Care of Yourself after Urodynamics   Drink plenty of water for a day or two following your procedure. Try to have about 8 ounces (one cup) at a time, and do this 6 times or more per day unless you have fluid restrictitons AVOID irritative beverages such as coffee, tea, soda, alcoholic or citrus drinks for a day or two, as this may cause burning with urination. You may experience some discomfort or a burning sensation with urination after having this procedure. You can use over the counter Azo or pyridium to help with burning and follow the instructions on the packaging. If it does not improve within 1-2 days, or other symptoms appear (fever, chills, or difficulty urinating) call the office to speak to a nurse.  You may return to normal daily activities such as work, school, driving, exercising and housework on the day of the procedure.    

## 2022-06-01 ENCOUNTER — Other Ambulatory Visit: Payer: Self-pay | Admitting: Internal Medicine

## 2022-06-02 ENCOUNTER — Other Ambulatory Visit: Payer: Self-pay | Admitting: Internal Medicine

## 2022-06-16 DIAGNOSIS — Z794 Long term (current) use of insulin: Secondary | ICD-10-CM | POA: Diagnosis not present

## 2022-06-16 DIAGNOSIS — E1165 Type 2 diabetes mellitus with hyperglycemia: Secondary | ICD-10-CM | POA: Diagnosis not present

## 2022-06-25 ENCOUNTER — Ambulatory Visit (INDEPENDENT_AMBULATORY_CARE_PROVIDER_SITE_OTHER): Payer: Medicare HMO | Admitting: Obstetrics and Gynecology

## 2022-06-25 ENCOUNTER — Encounter: Payer: Self-pay | Admitting: Obstetrics and Gynecology

## 2022-06-25 VITALS — BP 143/83 | HR 60

## 2022-06-25 DIAGNOSIS — N812 Incomplete uterovaginal prolapse: Secondary | ICD-10-CM | POA: Diagnosis not present

## 2022-06-25 DIAGNOSIS — N8110 Cystocele, unspecified: Secondary | ICD-10-CM

## 2022-06-25 DIAGNOSIS — R339 Retention of urine, unspecified: Secondary | ICD-10-CM | POA: Diagnosis not present

## 2022-06-25 NOTE — Patient Instructions (Signed)
Urinate twice every time you urinate to help empty your bladder better.

## 2022-06-25 NOTE — Progress Notes (Unsigned)
[facility] Return Visit  SUBJECTIVE  History of Present Illness: [name] [name] is a 72 y.o. female seen in follow-up for prolapse, urge incontinence, incomplete bladder emptying and fecal incontinence. She is using a #5 RWS pessary and underwent urodynamic testing.   Pessary was painful and it did not work for her. She removed it. Bulge is more bothersome and she wants to proceed with surgery.   Urodynamic Impression:  1. Sensation was increased; capacity was normal 2. Stress Incontinence was demonstrated at ISD range pressures; 3. Detrusor Overactivity was demonstrated at capacity 4. Emptying was dysfunctional with a elevated PVR (460ml), a sustained detrusor contraction present,  abdominal straining not present, dyssynergic urethral sphincter activity on EMG.  Past Medical History: Patient  has a past medical history of Anemia, Carpal tunnel syndrome, Coronary artery disease, Diabetes mellitus without complication ([place]), Dyslipidemia, Hypertension, Osteoarthritis, PAD (peripheral artery disease) ([place]), and Sleep apnea.   Past Surgical History: She  has a past surgical history that includes Wrist surgery; Tonsillectomy; Appendectomy; heart stent; Breast excisional biopsy (Right, >10+ yrs ago); and Coronary angioplasty with stent.   Medications: She has a current medication list which includes the following prescription(s): trulicity, amlodipine, vitamin c, aspirin ec, coq-10, dexcom g7 sensor, estradiol, farxiga, gvoke hypopen 2-pack, glucosamine-chondroitin, glucose blood, insulin lispro, bd pen needle nano 2nd gen, magnesium oxide, metoprolol tartrate, neuriva, nystatin, omeprazole, onetouch delica lancets 16W, pregabalin, rosuvastatin, trazodone, vitamin a, vitamin d (ergocalciferol), and vitamin e.   Allergies: Patient is allergic to fish allergy, other, and latex.   Social History: Patient  reports that she quit smoking about 8 years ago. Her smoking use included  cigarettes. She has a 15.00 pack-year smoking history. She has never used smokeless tobacco. She reports current alcohol use. She reports that she does not currently use drugs.      OBJECTIVE     Physical Exam: Vitals:   06/25/22 1126  BP: (!) 143/83  Pulse: 60   Gen: No apparent distress, A&O x 3.  Detailed Urogynecologic Evaluation:  Deferred. Prior exam showed:  POP-Q (04/16/22)   -1                                            Aa   -1                                           Ba   -5.5                                              C    3                                            Gh   1.5                                            Pb   7.5  tvl    -2.5                                            Ap   -2.5                                            Bp   -7                                              D        ASSESSMENT AND PLAN    [name] [name] is a 72 y.o. with:  1. Urinary retention   2. Prolapse of anterior vaginal wall   3. Uterovaginal prolapse, incomplete    - Discussed urodynamic test results in detail. She is not emptying her bladder well. Increased EMG activity noted, but had good detrusor contraction. We discussed pelvic physical therapy, double voiding and retraining to void. Unclear if symptoms will improve with correction of prolapse and she understands this.  - Will plan for renal US to evaluate for hydronephrosis - Recent renal function tests normal - If incomplete emptying does not improve post-operatively, then will further plan for self- catheterizing and pelvic PT. For now, we discussed double voiding and precautions if she is unable to void.   Plan for surgery: Exam under anesthesia, anterior repair, sacrospinous hysteropexy, cystoscopy  - We reviewed the patient's specific anatomic and functional findings, with the assistance of diagrams, and together finalized the above procedure. The planned  surgical procedures were discussed along with the surgical risks outlined below, which were also provided on a detailed handout. Additional treatment options including expectant management, conservative management, medical management were discussed where appropriate.  We reviewed the benefits and risks of each treatment option.   General Surgical Risks: For all procedures, there are risks of bleeding, infection, damage to surrounding organs including but not limited to bowel, bladder, blood vessels, ureters and nerves, and need for further surgery if an injury were to occur. These risks are all low with minimally invasive surgery.   There are risks of numbness and weakness at any body site or buttock/rectal pain.  It is possible that baseline pain can be worsened by surgery, either with or without mesh. If surgery is vaginal, there is also a low risk of possible conversion to laparoscopy or open abdominal incision where indicated. Very rare risks include blood transfusion, blood clot, heart attack, pneumonia, or death.   There is also a risk of short-term postoperative urinary retention with need to use a catheter. About half of patients need to go home from surgery with a catheter, which is then later removed in the office. The risk of long-term need for a catheter is very low. There is also a risk of worsening of overactive bladder.   Prolapse (with or without mesh): Risk factors for surgical failure  include things that put pressure on your pelvis and the surgical repair, including obesity, chronic cough, and heavy lifting or straining (including lifting children or adults, straining on the toilet, or lifting heavy objects such as furniture or anything weighing >25 lbs. Risks of recurrence is  20-30% with vaginal native tissue repair and a less than 10% with sacrocolpopexy with mesh.    - For preop Visit:  She is required to have a visit within 30 days of her surgery.    - Medical clearance:  required for Cardiology due to history of coronary stent.  Letter sent to Dr Antoine Poche requesting risk stratification and medical optimization. - Anticoagulant use: No - Medicaid Hysterectomy form: No - Accepts blood transfusion: Yes - Expected length of stay: outpatient  Request sent for surgery scheduling.   Marguerita Beards, MD

## 2022-06-29 ENCOUNTER — Other Ambulatory Visit: Payer: Self-pay | Admitting: Internal Medicine

## 2022-06-30 ENCOUNTER — Ambulatory Visit (HOSPITAL_COMMUNITY)
Admission: RE | Admit: 2022-06-30 | Discharge: 2022-06-30 | Disposition: A | Discharge status: Home / Self Care | Payer: Medicare HMO | Source: Ambulatory Visit | Attending: Obstetrics and Gynecology | Admitting: Obstetrics and Gynecology

## 2022-06-30 DIAGNOSIS — N1330 Unspecified hydronephrosis: Secondary | ICD-10-CM | POA: Insufficient documentation

## 2022-06-30 DIAGNOSIS — R39198 Other difficulties with micturition: Secondary | ICD-10-CM | POA: Diagnosis not present

## 2022-06-30 DIAGNOSIS — R339 Retention of urine, unspecified: Secondary | ICD-10-CM

## 2022-07-02 ENCOUNTER — Encounter: Payer: Self-pay | Admitting: Podiatry

## 2022-07-02 ENCOUNTER — Ambulatory Visit (INDEPENDENT_AMBULATORY_CARE_PROVIDER_SITE_OTHER): Payer: Medicare HMO | Admitting: Podiatry

## 2022-07-02 VITALS — BP 140/81

## 2022-07-02 DIAGNOSIS — M79675 Pain in left toe(s): Secondary | ICD-10-CM

## 2022-07-02 DIAGNOSIS — M2141 Flat foot [pes planus] (acquired), right foot: Secondary | ICD-10-CM

## 2022-07-02 DIAGNOSIS — M79674 Pain in right toe(s): Secondary | ICD-10-CM

## 2022-07-02 DIAGNOSIS — Q828 Other specified congenital malformations of skin: Secondary | ICD-10-CM | POA: Diagnosis not present

## 2022-07-02 DIAGNOSIS — B351 Tinea unguium: Secondary | ICD-10-CM

## 2022-07-02 DIAGNOSIS — E119 Type 2 diabetes mellitus without complications: Secondary | ICD-10-CM | POA: Diagnosis not present

## 2022-07-02 DIAGNOSIS — M2142 Flat foot [pes planus] (acquired), left foot: Secondary | ICD-10-CM | POA: Diagnosis not present

## 2022-07-02 DIAGNOSIS — M2041 Other hammer toe(s) (acquired), right foot: Secondary | ICD-10-CM | POA: Diagnosis not present

## 2022-07-02 DIAGNOSIS — M2042 Other hammer toe(s) (acquired), left foot: Secondary | ICD-10-CM | POA: Diagnosis not present

## 2022-07-02 MED ORDER — CICLOPIROX 8 % EX SOLN: CUTANEOUS | 11 refills | Status: DC

## 2022-07-02 NOTE — Patient Instructions (Signed)
Ciclopirox Topical Solution What is this medication? CICLOPIROX (sye kloe PEER ox) treats fungal infections of the nails. It belongs to a group of medications called antifungals. It will not treat infections caused by bacteria or viruses. This medicine may be used for other purposes; ask your health care provider or pharmacist if you have questions. COMMON BRAND NAME(S): Ciclodan Nail Solution, CNL8, Penlac What should I tell my care team before I take this medication? They need to know if you have any of these conditions: Diabetes (high blood sugar) Immune system problems Organ transplant Receiving steroid inhalers, cream, or lotion Seizures Tingling of the fingers or toes or other nerve disorder An unusual or allergic reaction to ciclopirox, other medications, foods, dyes, or preservatives Pregnant or trying to get pregnant Breast-feeding How should I use this medication? This medication is for external use only. Do not take by mouth. Wash your hands before and after use. If you are treating your hands, only wash your hands before use. Do not get it in your eyes. If you do, rinse your eyes with plenty of cool tap water. Use it as directed on the prescription label at the same time every day. Do not use it more often than directed. Use the medication for the full course as directed by your care team, even if you think you are better. Do not stop using it unless your care team tells you to stop it early. Apply a thin film of the medication to the affected area. Talk to your care team about the use of this medication in children. While it may be prescribed for children as young as 12 years for selected conditions, precautions do apply. Overdosage: If you think you have taken too much of this medicine contact a poison control center or emergency room at once. NOTE: This medicine is only for you. Do not share this medicine with others. What if I miss a dose? If you miss a dose, use it as soon as  you can. If it is almost time for your next dose, use only that dose. Do not use double or extra doses. What may interact with this medication? Interactions are not expected. Do not use any other skin products without telling your care team. This list may not describe all possible interactions. Give your health care provider a list of all the medicines, herbs, non-prescription drugs, or dietary supplements you use. Also tell them if you smoke, drink alcohol, or use illegal drugs. Some items may interact with your medicine. What should I watch for while using this medication? Visit your care team for regular checks on your progress. It may be some time before you see the benefit from this medication. Do not use nail polish or other nail cosmetic products on the treated nails. Removal of the unattached, infected nail by your care team is needed with use of this medication. If you have diabetes or numbness in your fingers or toes, talk to your care team about proper nail care. What side effects may I notice from receiving this medication? Side effects that you should report to your care team as soon as possible: Allergic reactions--skin rash, itching, hives, swelling of the face, lips, tongue, or throat Burning, itching, crusting, or peeling of treated skin Side effects that usually do not require medical attention (report to your care team if they continue or are bothersome): Change in nail shape, thickness, or color Mild skin irritation, redness, or dryness This list may not describe all possible side   effects. Call your doctor for medical advice about side effects. You may report side effects to FDA at 1-800-FDA-1088. Where should I keep my medication? Keep out of the reach of children and pets. Store at room temperature between 20 and 25 degrees C (68 and 77 degrees F). This medication is flammable. Avoid exposure to heat, fire, flame, and smoking. Get rid of medications that are no longer needed  or have expired: Take the medication to a medication take-back program. Check with your pharmacy or law enforcement to find a location. If you cannot return the medication, check the label or package insert to see if the medication should be thrown out in the garbage or flushed down the toilet. If you are not sure, ask your care team. If it is safe to put in the trash, take the medication out of the container. Mix the medication with cat litter, dirt, coffee grounds, or other unwanted substance. Seal the mixture in a bag or container. Put it in the trash. NOTE: This sheet is a summary. It may not cover all possible information. If you have questions about this medicine, talk to your doctor, pharmacist, or health care provider.  2023 Elsevier/Gold Standard (2021-05-21 00:00:00)  

## 2022-07-02 NOTE — Progress Notes (Signed)
ANNUAL DIABETIC FOOT EXAM  Subjective: [name] [name] presents today for annual diabetic foot examination.  Chief Complaint  Patient presents with   Nail Problem    DFC BS-120 A1C-7.1 PCP-Sanders, Robyn PCP VST-05/21/22   Patient confirms h/o diabetes.  Patient denies any h/o foot wounds.  Patient has been diagnosed with neuropathy.  Risk factors: diabetes, PAD, HTN, CAD, CKD, dyslipidemia, h/o tobacco use in remission.  [place] Chard, MD is patient's PCP.   Past Medical History:  Diagnosis Date   Anemia    Carpal tunnel syndrome    Coronary artery disease    DES to ostial RCA 2005   Diabetes mellitus without complication (HCC)    No longer taking meds   Dyslipidemia    Hypertension    Osteoarthritis    PAD (peripheral artery disease) ([place])    Sleep apnea    Patient Active Problem List   Diagnosis Date Noted   Pure hypercholesterolemia 04/30/2022   Female genital prolapse 04/30/2022   Constipation 12/16/2021   Pelvic relaxation 10/17/2021   Parenchymal renal hypertension 07/04/2020   Educated about COVID-19 virus infection 03/25/2020   OSA and COPD overlap syndrome ([place]) 03/14/2019   Snoring 02/17/2019   Peripheral arterial disease ([place]) 01/26/2019   Hepatitis C carrier ([place]) 01/26/2019   Hypertensive heart and renal disease 08/19/2018   Class 1 obesity due to excess calories with serious comorbidity and body mass index (BMI) of 33.0 to 33.9 in adult 08/19/2018   Paresthesia of left upper extremity 06/29/2018   Atherosclerosis of native coronary artery of native heart with angina pectoris ([place]) 04/09/2018   Dyslipidemia 04/09/2018   Primary osteoarthritis of both feet 11/05/2017   Primary osteoarthritis of both hands 10/30/2017   Primary osteoarthritis of both knees 10/30/2017   Primary insomnia 10/30/2017   Family history of lupus erythematosus 10/30/2017   Dyspnea on exertion 12/10/2016   Type 2 diabetes mellitus with stage 2 chronic kidney disease,  with long-term current use of insulin ([place]) 09/21/2014   Acute cystitis 09/21/2014   Hyperglycemia 09/19/2014   Bradycardia 09/19/2014   Hyponatremia 09/19/2014   Elevated alkaline phosphatase level 09/19/2014   Acute kidney injury [facility])    Essential hypertension, benign 07/05/2008   Coronary atherosclerosis 07/05/2008   Past Surgical History:  Procedure Laterality Date   APPENDECTOMY     BREAST EXCISIONAL BIOPSY Right >10+ yrs ago   benign   CORONARY ANGIOPLASTY WITH STENT PLACEMENT     2006   heart stent     TONSILLECTOMY     WRIST SURGERY     Current Outpatient Medications on File Prior to Visit  Medication Sig Dispense Refill   TRULICITY 1.5 IO/2.7OJ SOPN ADMINISTER 1.5 MG UNDER THE SKIN 1 TIME A WEEK 6 mL 3   amLODipine (NORVASC) 10 MG tablet TAKE 1 TABLET(10 MG) BY MOUTH DAILY 90 tablet 1   Ascorbic Acid (VITAMIN C) 500 MG CHEW 1 tablet     aspirin EC 81 MG tablet Take 81 mg by mouth daily. Swallow whole.     Coenzyme Q10 (COQ-10) 100 MG CAPS Take 1 tablet by mouth daily.      Continuous Blood Gluc Sensor (DEXCOM G7 SENSOR) MISC Use to check blood sugar 3 times a day. Dx code e11.65 3 each 3   estradiol (ESTRACE) 0.1 MG/GM vaginal cream Place 0.5 g vaginally 2 (two) times a week. Place 0.5g nightly for two weeks then twice a week after 30 g 11   FARXIGA 10 MG TABS tablet  TAKE 1 TABLET(10 MG) BY MOUTH DAILY BEFORE BREAKFAST 30 tablet 1   Glucagon (GVOKE HYPOPEN 2-PACK) 0.5 MG/0.1ML SOAJ Use for blood sugars less than 60. 0.2 mL 3   glucosamine-chondroitin 500-400 MG tablet Take 1 tablet by mouth daily. Reported on 12/17/2015     glucose blood (ONETOUCH VERIO) test strip CHECK BLOOD SUGAR THREE TIMES DAILY dx: e11.65 150 each 11   insulin lispro (HUMALOG KWIKPEN) 100 UNIT/ML KwikPen Inject subcutaneously 2 units if sugars are 150 - 199, 4 units 200 -249, 6 units 250- 299, 8 units 300 - 349, 10 units >350 15 mL 0   Insulin Pen Needle (BD PEN NEEDLE NANO 2ND GEN) 32G X 4 MM MISC  USE TO INJECT INSULIN DAILY 100 each 2   MAGNESIUM OXIDE PO Take 400 mg by mouth daily.     metoprolol tartrate (LOPRESSOR) 50 MG tablet TAKE 1 TABLET BY MOUTH TWICE DAILY 180 tablet 4   Misc Natural Products (NEURIVA) CAPS Take 1 capsule by mouth daily.     nystatin (NYSTATIN) powder Apply topically 3 (three) times daily. As needed 60 g 0   omeprazole (PRILOSEC) 20 MG capsule TAKE 1 CAPSULE BY MOUTH DAILY 90 capsule 1   OneTouch Delica Lancets 16R MISC USE AS DIRECTED TO CHECK BLOOD SUGAR THREE TIMES DAILY 100 each 0   pregabalin (LYRICA) 50 MG capsule TAKE 1 CAPSULE(50 MG) BY MOUTH AT BEDTIME 90 capsule 1   rosuvastatin (CRESTOR) 20 MG tablet TAKE 1 TABLET(20 MG) BY MOUTH DAILY 90 tablet 3   traZODone (DESYREL) 50 MG tablet TAKE 1 TABLET BY MOUTH EVERY NIGHT AT BEDTIME 90 tablet 1   Vitamin A 3 MG (10000 UT) TABS Take 1 tablet by mouth daily. 2,400 mcg     Vitamin D, Ergocalciferol, (DRISDOL) 1.25 MG (50000 UNIT) CAPS capsule TAKE 1 CAPSULE BY MOUTH EVERY 7 DAYS 12 capsule 1   vitamin E 600 UNIT capsule Take 400 Units by mouth daily.     No current facility-administered medications on file prior to visit.    Allergies  Allergen Reactions   Fish Allergy     Cod   Other     Other reaction(s): hives   Latex Rash   Social History   Occupational History   Occupation: retired    Comment: disabled  Tobacco Use   Smoking status: Former    Packs/day: 0.50    Years: 30.00    Total pack years: 15.00    Types: Cigarettes    Quit date: 12/20/2013    Years since quitting: 8.5   Smokeless tobacco: Never  Vaping Use   Vaping Use: Never used  Substance and Sexual Activity   Alcohol use: Yes    Comment: rarely   Drug use: Not Currently    Comment: quit 2013   Sexual activity: Not Currently    Birth control/protection: None   Family History  Problem Relation Age of Onset   CAD Father 34   Heart attack Father    CAD Mother 35   Heart attack Mother    CAD Brother    Cancer Brother         prostate   CAD Sister    Cancer Sister        breast   CAD Brother    Immunization History  Administered Date(s) Administered   Fluad Quad(high Dose 65+) 03/04/2019, 03/10/2022   Influenza, High Dose Seasonal PF 03/21/2017, 03/11/2018   Influenza-Unspecified 03/07/2020, 03/13/2021   PFIZER(Purple Top)SARS-COV-2 Vaccination  07/30/2019, 08/20/2019, 04/16/2020, 11/23/2020, 05/01/2021   PNEUMOCOCCAL CONJUGATE-20 04/27/2021   Pneumococcal Conjugate-13 10/23/2016   Tdap 12/15/2014   Unspecified SARS-COV-2 Vaccination 03/10/2022   Zoster Recombinat (Shingrix) 01/25/2019, 03/30/2019     Review of Systems: Negative except as noted in the HPI.   Objective: Vitals:   07/02/22 0949  BP: (!) 140/81   [name] [name] is a pleasant 72 y.o. female in NAD. AAO X 3.  Vascular Examination: Capillary refill time immediate b/l. Vascular status intact b/l with palpable pedal pulses. Pedal hair present b/l. No edema. No pain with calf compression b/l. Skin temperature gradient WNL b/l. No ischemia or gangrene noted b/l LE. No cyanosis or clubbing noted b/l LE.  Neurological Examination: Sensation grossly intact b/l with 10 gram monofilament. Vibratory sensation intact b/l. Protective sensation diminished with 10g monofilament b/l. Vibratory sensation intact b/l.  Dermatological Examination: Pedal skin with normal turgor, texture and tone b/l. Toenails 1-5 b/l thick, discolored, elongated with subungual debris and pain on dorsal palpation. No open wounds b/l LE. No interdigital macerations noted b/l LE. Porokeratotic lesion(s) plantar aspect of left foot x 2 and plantar aspect of right foot x 2. No erythema, no edema, no drainage, no fluctuance.  Musculoskeletal Examination: Normal muscle strength 5/5 to all lower extremity muscle groups bilaterally. HAV with bunion bilaterally and hammertoes 2-5 b/l. Pes planus deformity noted bilateral LE.Marland Kitchen No pain, crepitus or joint limitation noted with ROM  b/l LE.  Patient ambulates independently without assistive aids.  Radiographs: None  Last A1c:      Latest Ref Rng & Units 04/30/2022    3:34 PM 01/08/2022    4:49 PM 08/22/2021   10:16 AM  Hemoglobin A1C  Hemoglobin-A1c 4.8 - 5.6 % 7.1  7.4  7.2    Footwear Assessment: Does the patient wear appropriate shoes? Yes. Does the patient need inserts/orthotics? No.  ADA Risk Categorization: High Risk  Patient has one or more of the following: Loss of protective sensation Absent pedal pulses Severe Foot deformity History of foot ulcer  Assessment: 1. Pain due to onychomycosis of toenails of both feet   2. Porokeratosis   3. Hammer toes of both feet   4. Pes planus of both feet   5. Diabetes mellitus without complication (HCC)   6. Encounter for comprehensive diabetic foot examination, type 2 diabetes mellitus (HCC)     Plan:  Meds ordered this encounter  Medications   ciclopirox (PENLAC) 8 % solution    Sig: Apply one coat to toenail qd.  Remove weekly with polish remover.    Dispense:  6.6 mL    Refill:  11    -Patient was evaluated and treated. All patient's and/or POA's questions/concerns answered on today's visit. -Diabetic foot examination performed today. -Continue diabetic foot care principles: inspect feet daily, monitor glucose as recommended by PCP and/or Endocrinologist, and follow prescribed diet per PCP, Endocrinologist and/or dietician. -Continue supportive shoe gear daily. -Discussed topical, laser and oral medication. Patient opted for topical treatment. Rx sent to pharmacy for 8% Ciclopirox Solution.  Apply one coat to each toenail once daily for 48 weeks. Remove once weekly with nail polish remover. -Toenails 1-5 b/l were debrided in length and girth with sterile nail nippers and dremel without iatrogenic bleeding.  -Porokeratotic lesion(s) plantar midfoot b/l pared and enucleated with sterile currette without incident. Total number of lesions  debrided=4. -Patient/POA to call should there be question/concern in the interim. Return in about 3 months (around 10/01/2022).  Freddie Breech,  DPM

## 2022-07-04 ENCOUNTER — Encounter (HOSPITAL_BASED_OUTPATIENT_CLINIC_OR_DEPARTMENT_OTHER): Payer: Self-pay | Admitting: *Deleted

## 2022-07-20 ENCOUNTER — Other Ambulatory Visit: Payer: Self-pay | Admitting: Internal Medicine

## 2022-07-20 NOTE — Progress Notes (Unsigned)
Cardiology Office Note   Date:  07/20/2022   ID:  [name] [name], DOB May 06, 1951, MRN [id number]  PCP:  [name] Chard, MD  Cardiologist:   Minus Breeding, MD   No chief complaint on file.     History of Present Illness: [name] [name] is a 72 y.o. female who presents follow up of CAD.   She had a history of a PCI in 2005.  She had a stress perfusion study in 2013 demonstrated no evidence of ischemia and well-preserved ejection fraction.  In 2018 she had a negative inadequate POET (Plain Old Exercise Treadmill).    In 2020 she was having some increased dyspnea.  She had an echo ordered by her PCP. She had normal LV function and no significant valvular abnormalities.    Since I last saw her ***  ***    *** Since I last saw her she has been catering and has lost about 20 pounds over several months.  She is feeling well.  She has had no new shortness of breath and actually this has resolved.  She has had no chest pressure, neck or arm discomfort.  She has had no weight gain or edema.   Past Medical History:  Diagnosis Date   Anemia    Carpal tunnel syndrome    Coronary artery disease    DES to ostial RCA 2005   Diabetes mellitus without complication (HCC)    No longer taking meds   Dyslipidemia    Hypertension    Osteoarthritis    PAD (peripheral artery disease) ([place])    Sleep apnea     Past Surgical History:  Procedure Laterality Date   APPENDECTOMY     BREAST EXCISIONAL BIOPSY Right >10+ yrs ago   benign   CORONARY ANGIOPLASTY WITH STENT PLACEMENT     2006   heart stent     TONSILLECTOMY     WRIST SURGERY       Current Outpatient Medications  Medication Sig Dispense Refill   TRULICITY 1.5 EU/2.3NT SOPN ADMINISTER 1.5 MG UNDER THE SKIN 1 TIME A WEEK 6 mL 3   amLODipine (NORVASC) 10 MG tablet TAKE 1 TABLET(10 MG) BY MOUTH DAILY 90 tablet 1   Ascorbic Acid (VITAMIN C) 500 MG CHEW 1 tablet     aspirin EC 81 MG tablet Take 81 mg by mouth daily. Swallow whole.      ciclopirox (PENLAC) 8 % solution Apply one coat to toenail qd.  Remove weekly with polish remover. 6.6 mL 11   Coenzyme Q10 (COQ-10) 100 MG CAPS Take 1 tablet by mouth daily.      Continuous Blood Gluc Sensor (DEXCOM G7 SENSOR) MISC Use to check blood sugar 3 times a day. Dx code e11.65 3 each 3   estradiol (ESTRACE) 0.1 MG/GM vaginal cream Place 0.5 g vaginally 2 (two) times a week. Place 0.5g nightly for two weeks then twice a week after 30 g 11   FARXIGA 10 MG TABS tablet TAKE 1 TABLET(10 MG) BY MOUTH DAILY BEFORE BREAKFAST 30 tablet 1   Glucagon (GVOKE HYPOPEN 2-PACK) 0.5 MG/0.1ML SOAJ Use for blood sugars less than 60. 0.2 mL 3   glucosamine-chondroitin 500-400 MG tablet Take 1 tablet by mouth daily. Reported on 12/17/2015     glucose blood (ONETOUCH VERIO) test strip CHECK BLOOD SUGAR THREE TIMES DAILY dx: e11.65 150 each 11   insulin lispro (HUMALOG KWIKPEN) 100 UNIT/ML KwikPen Inject subcutaneously 2 units if sugars are 150 - 199, 4 units  200 -249, 6 units 250- 299, 8 units 300 - 349, 10 units >350 15 mL 0   Insulin Pen Needle (BD PEN NEEDLE NANO 2ND GEN) 32G X 4 MM MISC USE TO INJECT INSULIN DAILY 100 each 2   MAGNESIUM OXIDE PO Take 400 mg by mouth daily.     metoprolol tartrate (LOPRESSOR) 50 MG tablet TAKE 1 TABLET BY MOUTH TWICE DAILY 180 tablet 4   Misc Natural Products (NEURIVA) CAPS Take 1 capsule by mouth daily.     nystatin (NYSTATIN) powder Apply topically 3 (three) times daily. As needed 60 g 0   omeprazole (PRILOSEC) 20 MG capsule TAKE 1 CAPSULE BY MOUTH DAILY 90 capsule 1   OneTouch Delica Lancets 33G MISC USE AS DIRECTED TO CHECK BLOOD SUGAR THREE TIMES DAILY 100 each 0   pregabalin (LYRICA) 50 MG capsule TAKE 1 CAPSULE(50 MG) BY MOUTH AT BEDTIME 90 capsule 1   rosuvastatin (CRESTOR) 20 MG tablet TAKE 1 TABLET(20 MG) BY MOUTH DAILY 90 tablet 3   traZODone (DESYREL) 50 MG tablet TAKE 1 TABLET BY MOUTH EVERY NIGHT AT BEDTIME 90 tablet 1   Vitamin A 3 MG (10000 UT) TABS Take  1 tablet by mouth daily. 2,400 mcg     Vitamin D, Ergocalciferol, (DRISDOL) 1.25 MG (50000 UNIT) CAPS capsule TAKE 1 CAPSULE BY MOUTH EVERY 7 DAYS 12 capsule 1   vitamin E 600 UNIT capsule Take 400 Units by mouth daily.     No current facility-administered medications for this visit.    Allergies:   Fish allergy, Other, and Latex    ROS:  Please see the history of present illness.   Otherwise, review of systems are positive for ***.   All other systems are reviewed and occasional right shooting groin pain.    PHYSICAL EXAM: VS:  There were no vitals taken for this visit. , BMI There is no height or weight on file to calculate BMI. GENERAL:  Well appearing NECK:  No jugular venous distention, waveform within normal limits, carotid upstroke brisk and symmetric, no bruits, no thyromegaly LUNGS:  Clear to auscultation bilaterally CHEST:  Unremarkable HEART:  PMI not displaced or sustained,S1 and S2 within normal limits, no S3, no S4, no clicks, no rubs, *** murmurs ABD:  Flat, positive bowel sounds normal in frequency in pitch, no bruits, no rebound, no guarding, no midline pulsatile mass, no hepatomegaly, no splenomegaly EXT:  2 plus pulses throughout, no edema, no cyanosis no clubbing    ***GENERAL:  Well appearing NECK:  No jugular venous distention, waveform within normal limits, carotid upstroke brisk and symmetric, no bruits, no thyromegaly LUNGS:  Clear to auscultation bilaterally CHEST:  Unremarkable HEART:  PMI not displaced or sustained,S1 and S2 within normal limits, no S3, no S4, no clicks, no rubs, no murmurs ABD:  Flat, positive bowel sounds normal in frequency in pitch, no bruits, no rebound, no guarding, no midline pulsatile mass, no hepatomegaly, no splenomegaly EXT:  2 plus pulses throughout, no edema, no cyanosis no clubbing   EKG:  EKG is *** ordered today. The ekg ordered *** demonstrates sinus rhythm, rate *** premature ectopic complexes, low voltage on the limb  leads, poor anterior R wave progression, nonspecific T wave flattening.   Recent Labs: 01/08/2022: ALT 13 04/30/2022: BUN 14; Creatinine, Ser 0.84; Hemoglobin 12.5; Platelets 265; Potassium 4.2; Sodium 146    Lipid Panel    Component Value Date/Time   CHOL 157 04/30/2022 1534   TRIG 135 04/30/2022 1534  HDL 59 04/30/2022 1534   CHOLHDL 2.7 04/30/2022 1534   CHOLHDL 3.0 Ratio 09/16/2010 2137   VLDL 39 09/16/2010 2137   LDLCALC 75 04/30/2022 1534      Wt Readings from Last 3 Encounters:  05/28/22 173 lb (78.5 kg)  05/20/22 173 lb (78.5 kg)  05/01/22 173 lb (78.5 kg)      Other studies Reviewed: Additional studies/ records that were reviewed today include:  *** Review of the above records demonstrates:  Please see elsewhere in the note.     ASSESSMENT AND PLAN:  CAD:   *** The patient has no new sypmtoms.  No further cardiovascular testing is indicated.  We will continue with aggressive risk reduction and meds as listed.   DYSLIPIDEMIA:   LDL was *** 72.  No change in therapy.   DM:  Her A1c was *** coming down and was 7.3 and had been 7.9 but is being followed closely by Dr. Baird Cancer, Bailey Mech, MD   HTN: Her blood pressure is ***at target.  No change in therapy.   SLEEP APNEA:  ***  He does have CPAP but does not wear it.   Current medicines are reviewed at length with the patient today.  The patient does not have concerns regarding medicines.  The following changes have been made:  ***  Labs/ tests ordered today include:  ***  No orders of the defined types were placed in this encounter.    Disposition:   FU with me in ***   Signed, Minus Breeding, MD  07/20/2022 1:21 PM    [facility]

## 2022-07-21 ENCOUNTER — Encounter: Payer: Self-pay | Admitting: Cardiology

## 2022-07-21 ENCOUNTER — Ambulatory Visit: Payer: Medicare HMO | Attending: Cardiology | Admitting: Cardiology

## 2022-07-21 VITALS — BP 148/82 | HR 65 | Ht 66.0 in | Wt 170.0 lb

## 2022-07-21 DIAGNOSIS — I10 Essential (primary) hypertension: Secondary | ICD-10-CM | POA: Diagnosis not present

## 2022-07-21 DIAGNOSIS — R079 Chest pain, unspecified: Secondary | ICD-10-CM | POA: Diagnosis not present

## 2022-07-21 DIAGNOSIS — E118 Type 2 diabetes mellitus with unspecified complications: Secondary | ICD-10-CM

## 2022-07-21 DIAGNOSIS — E785 Hyperlipidemia, unspecified: Secondary | ICD-10-CM | POA: Diagnosis not present

## 2022-07-21 DIAGNOSIS — I25119 Atherosclerotic heart disease of native coronary artery with unspecified angina pectoris: Secondary | ICD-10-CM | POA: Diagnosis not present

## 2022-07-21 NOTE — Patient Instructions (Signed)
Medication Instructions:  NO CHANGES  *If you need a refill on your cardiac medications before your next appointment, please call your pharmacy*   Testing/Procedures: Dr. Percival Spanish has ordered an exercise tolerance test  This is done at [place] [place] - 3rd floor    Follow-Up: At [facility], you and your health needs are our priority.  As part of our continuing mission to provide you with exceptional heart care, we have created designated Provider Care Teams.  These Care Teams include your primary Cardiologist (physician) and Advanced Practice Providers (APPs -  Physician Assistants and Nurse Practitioners) who all work together to provide you with the care you need, when you need it.  We recommend signing up for the patient portal called "MyChart".  Sign up information is provided on this After Visit Summary.  MyChart is used to connect with patients for Virtual Visits (Telemedicine).  Patients are able to view lab/test results, encounter notes, upcoming appointments, etc.  Non-urgent messages can be sent to your provider as well.   To learn more about what you can do with MyChart, go to [email or web address].    Your next appointment:    12 months with Dr. Percival Spanish

## 2022-07-28 NOTE — Progress Notes (Unsigned)
[facility] Pre-Operative H&P  Subjective Chief Complaint: [name] [name] presents for a preoperative encounter.   History of Present Illness: [name] [name] is a 72 y.o. female who presents for preoperative visit.  She is scheduled to undergo Exam under anesthesia, anterior repair, sacrospinous hysteropexy, cystoscopy  on 08/18/2022.  Her symptoms include pelvic organ prolapse, and she was was found to have Stage II anterior, Stage I posterior, Stage I apical prolapse.   Urodynamics showed: 1. Sensation was increased; capacity was normal 2. Stress Incontinence was demonstrated at ISD range pressures; 3. Detrusor Overactivity was demonstrated at capacity 4. Emptying was dysfunctional with a elevated PVR, a sustained detrusor contraction present,  abdominal straining not present, dyssynergic urethral sphincter activity on EMG.  Past Medical History:  Diagnosis Date   Anemia    Carpal tunnel syndrome    Coronary artery disease    DES to ostial RCA 2005   Diabetes mellitus without complication (HCC)    No longer taking meds   Dyslipidemia    Hypertension    Osteoarthritis    PAD (peripheral artery disease) ([place])    Sleep apnea      Past Surgical History:  Procedure Laterality Date   APPENDECTOMY     BREAST EXCISIONAL BIOPSY Right >10+ yrs ago   benign   CORONARY ANGIOPLASTY WITH STENT PLACEMENT     2006   heart stent     TONSILLECTOMY     WRIST SURGERY      is allergic to fish allergy, other, and latex.   Family History  Problem Relation Age of Onset   CAD Father 55   Heart attack Father    CAD Mother 63   Heart attack Mother    CAD Brother    Cancer Brother        prostate   CAD Sister    Cancer Sister        breast   CAD Brother     Social History   Tobacco Use   Smoking status: Former    Packs/day: 0.50    Years: 30.00    Total pack years: 15.00    Types: Cigarettes    Quit date: 12/20/2013    Years since quitting: 8.6   Smokeless  tobacco: Never  Vaping Use   Vaping Use: Never used  Substance Use Topics   Alcohol use: Yes    Comment: rarely   Drug use: Not Currently    Comment: quit 2013     Review of Systems was negative for a full 10 system review except as noted in the History of Present Illness.   Current Outpatient Medications:    amLODipine (NORVASC) 10 MG tablet, TAKE 1 TABLET(10 MG) BY MOUTH DAILY, Disp: 90 tablet, Rfl: 1   Ascorbic Acid (VITAMIN C) 500 MG CHEW, 1 tablet, Disp: , Rfl:    aspirin EC 81 MG tablet, Take 81 mg by mouth daily. Swallow whole., Disp: , Rfl:    ciclopirox (PENLAC) 8 % solution, Apply one coat to toenail qd.  Remove weekly with polish remover., Disp: 6.6 mL, Rfl: 11   Coenzyme Q10 (COQ-10) 100 MG CAPS, Take 1 tablet by mouth daily. , Disp: , Rfl:    Continuous Blood Gluc Sensor (DEXCOM G7 SENSOR) MISC, Use to check blood sugar 3 times a day. Dx code e11.65, Disp: 3 each, Rfl: 3   estradiol (ESTRACE) 0.1 MG/GM vaginal cream, Place 0.5 g vaginally 2 (two) times a week. Place 0.5g nightly for two weeks  then twice a week after, Disp: 30 g, Rfl: 11   FARXIGA 10 MG TABS tablet, TAKE 1 TABLET(10 MG) BY MOUTH DAILY BEFORE BREAKFAST, Disp: 30 tablet, Rfl: 1   Glucagon (GVOKE HYPOPEN 2-PACK) 0.5 MG/0.1ML SOAJ, Use for blood sugars less than 60., Disp: 0.2 mL, Rfl: 3   glucosamine-chondroitin 500-400 MG tablet, Take 1 tablet by mouth daily. Reported on 12/17/2015, Disp: , Rfl:    glucose blood (ONETOUCH VERIO) test strip, CHECK BLOOD SUGAR THREE TIMES DAILY dx: e11.65, Disp: 150 each, Rfl: 11   insulin lispro (HUMALOG KWIKPEN) 100 UNIT/ML KwikPen, Inject subcutaneously 2 units if sugars are 150 - 199, 4 units 200 -249, 6 units 250- 299, 8 units 300 - 349, 10 units >350, Disp: 15 mL, Rfl: 0   Insulin Pen Needle (BD PEN NEEDLE NANO 2ND GEN) 32G X 4 MM MISC, USE TO INJECT INSULIN DAILY, Disp: 100 each, Rfl: 2   MAGNESIUM OXIDE PO, Take 400 mg by mouth daily., Disp: , Rfl:    metoprolol tartrate  (LOPRESSOR) 50 MG tablet, TAKE 1 TABLET BY MOUTH TWICE DAILY, Disp: 180 tablet, Rfl: 4   Misc Natural Products (NEURIVA) CAPS, Take 1 capsule by mouth daily., Disp: , Rfl:    nystatin (NYSTATIN) powder, Apply topically 3 (three) times daily. As needed, Disp: 60 g, Rfl: 0   omeprazole (PRILOSEC) 20 MG capsule, TAKE 1 CAPSULE BY MOUTH DAILY, Disp: 90 capsule, Rfl: 1   OneTouch Delica Lancets 33G MISC, USE AS DIRECTED TO CHECK BLOOD SUGAR THREE TIMES DAILY, Disp: 100 each, Rfl: 0   pregabalin (LYRICA) 50 MG capsule, TAKE 1 CAPSULE(50 MG) BY MOUTH AT BEDTIME, Disp: 90 capsule, Rfl: 1   rosuvastatin (CRESTOR) 20 MG tablet, TAKE 1 TABLET(20 MG) BY MOUTH DAILY, Disp: 90 tablet, Rfl: 3   traZODone (DESYREL) 50 MG tablet, TAKE 1 TABLET BY MOUTH EVERY NIGHT AT BEDTIME, Disp: 90 tablet, Rfl: 1   TRULICITY 1.5 MG/0.5ML SOPN, ADMINISTER 1.5 MG UNDER THE SKIN 1 TIME A WEEK, Disp: 6 mL, Rfl: 3   Vitamin A 3 MG (10000 UT) TABS, Take 1 tablet by mouth daily. 2,400 mcg, Disp: , Rfl:    Vitamin D, Ergocalciferol, (DRISDOL) 1.25 MG (50000 UNIT) CAPS capsule, TAKE 1 CAPSULE BY MOUTH EVERY 7 DAYS, Disp: 12 capsule, Rfl: 1   vitamin E 600 UNIT capsule, Take 400 Units by mouth daily., Disp: , Rfl:    Objective There were no vitals filed for this visit.  Gen: NAD CV: S1 S2 RRR Lungs: Clear to auscultation bilaterally Abd: soft, nontender   Previous Pelvic Exam showed: Pelvic Exam: Normal external female genitalia; Bartholin's and Skene's glands normal in appearance; urethral meatus normal in appearance, no urethral masses or discharge.    CST: negative   Speculum exam reveals normal vaginal mucosa with atrophy. Cervix normal appearance. Uterus normal single, nontender. Adnexa no mass, fullness, tenderness.     Pelvic floor strength I/V, puborectalis I/V external anal sphincter I/V   Pelvic floor musculature: Right levator non-tender, Right obturator non-tender, Left levator non-tender, Left obturator  non-tender   POP-Q:    POP-Q   -1                                            Aa   -1  Ba   -5.5                                              C    3                                            Gh   1.5                                            Pb   7.5                                            tvl    -2.5                                            Ap   -2.5                                            Bp   -7                                              D          Rectal Exam:  Normal sphincter tone, minimal perineal body, no distal rectocele, enterocoele not present, no rectal masses, no sign of dyssynergia when asking the patient to bear down.    Assessment/ Plan  Assessment: The patient is a 72 y.o. year old scheduled to undergo Exam under anesthesia, anterior repair, sacrospinous hysteropexy, cystoscopy. Verbal consent was obtained for these procedures.  Plan: General Surgical Consent: The patient has previously been counseled on alternative treatments, and the decision by the patient and provider was to proceed with the procedure listed above.  For all procedures, there are risks of bleeding, infection, damage to surrounding organs including but not limited to bowel, bladder, blood vessels, ureters and nerves, and need for further surgery if an injury were to occur. These risks are all low with minimally invasive surgery.   There are risks of numbness and weakness at any body site or buttock/rectal pain.  It is possible that baseline pain can be worsened by surgery, either with or without mesh. If surgery is vaginal, there is also a low risk of possible conversion to laparoscopy or open abdominal incision where indicated. Very rare risks include blood transfusion, blood clot, heart attack, pneumonia, or death.   There is also a risk of short-term postoperative urinary retention with need to use a catheter. About half of  patients need to go home from surgery with a catheter, which is then later removed in the office. The risk of long-term need for a catheter is very  low. There is also a risk of worsening of overactive bladder.   ***Sling: The effectiveness of a midurethral vaginal mesh sling is approximately 85%, and thus, there will be times when you may leak urine after surgery, especially if your bladder is full or if you have a strong cough. There is a balance between making the sling tight enough to treat your leakage but not too tight so that you have long-term difficulty emptying your bladder. A mesh sling will not directly treat overactive bladder/urge incontinence and may worsen it.  There is an FDA safety notification on vaginal mesh procedures for prolapse but NOT mesh slings. We have extensive experience and training with mesh placement and we have close postoperative follow up to identify any potential complications from mesh. It is important to realize that this mesh is a permanent implant that cannot be easily removed. There are rare risks of mesh exposure (2-4%), pain with intercourse (0-7%), and infection (<1%). The risk of mesh exposure if more likely in a woman with risks for poor healing (prior radiation, poorly controlled diabetes, or immunocompromised). The risk of new or worsened chronic pain after mesh implant is more common in women with baseline chronic pain and/or poorly controlled anxiety or depression. Approximately 2-4% of patients will experience longer-term post-operative voiding dysfunction that may require surgical revision of the sling. We also reviewed that postoperatively, her stream may not be as strong as before surgery.   *** Prolapse (with or without mesh): Risk factors for surgical failure  include things that put pressure on your pelvis and the surgical repair, including obesity, chronic cough, and heavy lifting or straining (including lifting children or adults, straining on the  toilet, or lifting heavy objects such as furniture or anything weighing >25 lbs. Risks of recurrence is 20-30% with vaginal native tissue repair and a less than 10% with sacrocolpopexy with mesh.    ***Sacrocolpopexy: Mesh implants may provide more prolapse support, but do have some unique risks to consider. It is important to understand that mesh is permanent and cannot be easily removed. Risks of abdominal sacrocolpopexy mesh include mesh exposure (~3-6%), painful intercourse (recent studies show lower rates after surgery compared to before, with ~5-8% risk of new onset), and very rare risks of bowel or bladder injury or infection (<1%). The risk of mesh exposure is more likely in a woman with risks for poor healing (prior radiation, poorly controlled diabetes, or immunocompromised). The risk of new or worsened chronic pain after mesh implant is more common in women with baseline chronic pain and/or poorly controlled anxiety or depression. There is an FDA safety notification on vaginal mesh procedures for prolapse but NOT abdominal mesh procedures and therefore does not apply to your surgery. We have extensive experience and training with mesh placement and we have close postoperative follow up to identify any potential complications from mesh.    We discussed consent for blood products. Risks for blood transfusion include allergic reactions, other reactions that can affect different body organs and managed accordingly, transmission of infectious diseases such as HIV or Hepatitis. However, the blood is screened. Patient consents for blood products.***  Pre-operative instructions:  She was instructed to not take Aspirin/NSAIDs x 7days prior to surgery. She may continue her 81mg  ASA.*** Antibiotic prophylaxis was ordered as indicated.  Catheter use: Patient will go home with foley if needed after post-operative voiding trial.  Post-operative instructions:  She was provided with specific post-operative  instructions, including precautions and signs/symptoms for which we would  recommend contacting us, in addition to daytime and after-hours contact phone numbers. This was provided on a handout.   Post-operative medications: ***Prescriptions for motrin, tylenol, miralax, and oxycodone were sent to her pharmacy. Discussed using ibuprofen and tylenol on a schedule to limit use of narcotics.   Laboratory testing:  We will check labs: ***. Day of surgery UPT***  Preoperative clearance:  She {does/does not:19886} require surgical clearance.    Post-operative follow-up:  A post-operative appointment will be made for 6 weeks from the date of surgery. If she needs a post-operative nurse visit for a voiding trial, that will be set up after she leaves the hospital.    Patient will call the clinic or use MyChart should anything change or any new issues arise.   Selmer Dominion, NP   ***OR orders

## 2022-07-29 ENCOUNTER — Encounter: Payer: Self-pay | Admitting: Obstetrics and Gynecology

## 2022-07-29 ENCOUNTER — Ambulatory Visit (INDEPENDENT_AMBULATORY_CARE_PROVIDER_SITE_OTHER): Payer: Medicare HMO | Admitting: Obstetrics and Gynecology

## 2022-07-29 VITALS — BP 136/78 | HR 71 | Wt 170.6 lb

## 2022-07-29 DIAGNOSIS — Z01818 Encounter for other preprocedural examination: Secondary | ICD-10-CM

## 2022-07-29 DIAGNOSIS — N8110 Cystocele, unspecified: Secondary | ICD-10-CM | POA: Diagnosis not present

## 2022-07-29 MED ORDER — IBUPROFEN 600 MG PO TABS: 600.0000 mg | ORAL_TABLET | Freq: Four times a day (QID) | ORAL | 0 refills | Status: DC | PRN

## 2022-07-29 MED ORDER — GABAPENTIN 100 MG PO CAPS: 100.0000 mg | ORAL_CAPSULE | Freq: Three times a day (TID) | ORAL | 0 refills | Status: DC

## 2022-07-29 MED ORDER — ACETAMINOPHEN 500 MG PO TABS: 500.0000 mg | ORAL_TABLET | Freq: Four times a day (QID) | ORAL | 0 refills | Status: DC | PRN

## 2022-07-29 NOTE — H&P (Addendum)
[facility] H&P  Subjective Chief Complaint: [name] [name] presents for a preoperative encounter.   History of Present Illness: [name] [name] is a 72 y.o. female who presents for preoperative visit.  She is scheduled to undergo Exam under anesthesia, anterior repair, sacrospinous hysteropexy, cystoscopy  on 08/18/2022.  Her symptoms include pelvic organ prolapse, and she was was found to have Stage II anterior, Stage I posterior, Stage I apical prolapse.   Urodynamics showed: 1. Sensation was increased; capacity was normal 2. Stress Incontinence was demonstrated at ISD range pressures; 3. Detrusor Overactivity was demonstrated at capacity 4. Emptying was dysfunctional with a elevated PVR, a sustained detrusor contraction present,  abdominal straining not present, dyssynergic urethral sphincter activity on EMG.  Past Medical History:  Diagnosis Date   Anemia    Carpal tunnel syndrome    Coronary artery disease    DES to ostial RCA 2005   Diabetes mellitus without complication (HCC)    No longer taking meds   Dyslipidemia    Hypertension    Osteoarthritis    PAD (peripheral artery disease) ([place])    Sleep apnea      Past Surgical History:  Procedure Laterality Date   APPENDECTOMY     BREAST EXCISIONAL BIOPSY Right >10+ yrs ago   benign   CORONARY ANGIOPLASTY WITH STENT PLACEMENT     2006   heart stent     TONSILLECTOMY     WRIST SURGERY      is allergic to fish allergy, other, and latex.   Family History  Problem Relation Age of Onset   CAD Father 41   Heart attack Father    CAD Mother 84   Heart attack Mother    CAD Brother    Cancer Brother        prostate   CAD Sister    Cancer Sister        breast   CAD Brother     Social History   Tobacco Use   Smoking status: Former    Packs/day: 0.50    Years: 30.00    Total pack years: 15.00    Types: Cigarettes    Quit date: 12/20/2013    Years since quitting: 8.6   Smokeless tobacco: Never   Vaping Use   Vaping Use: Never used  Substance Use Topics   Alcohol use: Yes    Comment: rarely   Drug use: Not Currently    Comment: quit 2013     Review of Systems was negative for a full 10 system review except as noted in the History of Present Illness.  No current facility-administered medications for this encounter.  Current Outpatient Medications:    acetaminophen (TYLENOL) 500 MG tablet, Take 1 tablet (500 mg total) by mouth every 6 (six) hours as needed (pain)., Disp: 30 tablet, Rfl: 0   amLODipine (NORVASC) 10 MG tablet, TAKE 1 TABLET(10 MG) BY MOUTH DAILY, Disp: 90 tablet, Rfl: 1   Ascorbic Acid (VITAMIN C) 500 MG CHEW, 1 tablet, Disp: , Rfl:    aspirin EC 81 MG tablet, Take 81 mg by mouth daily. Swallow whole., Disp: , Rfl:    ciclopirox (PENLAC) 8 % solution, Apply one coat to toenail qd.  Remove weekly with polish remover., Disp: 6.6 mL, Rfl: 11   Coenzyme Q10 (COQ-10) 100 MG CAPS, Take 1 tablet by mouth daily. , Disp: , Rfl:    Continuous Blood Gluc Sensor (DEXCOM G7 SENSOR) MISC, Use to check blood sugar 3 times  a day. Dx code e11.65, Disp: 3 each, Rfl: 3   estradiol (ESTRACE) 0.1 MG/GM vaginal cream, Place 0.5 g vaginally 2 (two) times a week. Place 0.5g nightly for two weeks then twice a week after, Disp: 30 g, Rfl: 11   FARXIGA 10 MG TABS tablet, TAKE 1 TABLET(10 MG) BY MOUTH DAILY BEFORE BREAKFAST, Disp: 30 tablet, Rfl: 1   gabapentin (NEURONTIN) 100 MG capsule, Take 1 capsule (100 mg total) by mouth 3 (three) times daily., Disp: 15 capsule, Rfl: 0   Glucagon (GVOKE HYPOPEN 2-PACK) 0.5 MG/0.1ML SOAJ, Use for blood sugars less than 60., Disp: 0.2 mL, Rfl: 3   glucosamine-chondroitin 500-400 MG tablet, Take 1 tablet by mouth daily. Reported on 12/17/2015, Disp: , Rfl:    glucose blood (ONETOUCH VERIO) test strip, CHECK BLOOD SUGAR THREE TIMES DAILY dx: e11.65, Disp: 150 each, Rfl: 11   ibuprofen (ADVIL) 600 MG tablet, Take 1 tablet (600 mg total) by mouth every 6 (six)  hours as needed., Disp: 30 tablet, Rfl: 0   insulin lispro (HUMALOG KWIKPEN) 100 UNIT/ML KwikPen, Inject subcutaneously 2 units if sugars are 150 - 199, 4 units 200 -249, 6 units 250- 299, 8 units 300 - 349, 10 units >350, Disp: 15 mL, Rfl: 0   Insulin Pen Needle (BD PEN NEEDLE NANO 2ND GEN) 32G X 4 MM MISC, USE TO INJECT INSULIN DAILY, Disp: 100 each, Rfl: 2   MAGNESIUM OXIDE PO, Take 400 mg by mouth daily., Disp: , Rfl:    metoprolol tartrate (LOPRESSOR) 50 MG tablet, TAKE 1 TABLET BY MOUTH TWICE DAILY, Disp: 180 tablet, Rfl: 4   Misc Natural Products (NEURIVA) CAPS, Take 1 capsule by mouth daily., Disp: , Rfl:    nystatin (NYSTATIN) powder, Apply topically 3 (three) times daily. As needed, Disp: 60 g, Rfl: 0   omeprazole (PRILOSEC) 20 MG capsule, TAKE 1 CAPSULE BY MOUTH DAILY, Disp: 90 capsule, Rfl: 1   OneTouch Delica Lancets 60Y MISC, USE AS DIRECTED TO CHECK BLOOD SUGAR THREE TIMES DAILY, Disp: 100 each, Rfl: 0   pregabalin (LYRICA) 50 MG capsule, TAKE 1 CAPSULE(50 MG) BY MOUTH AT BEDTIME, Disp: 90 capsule, Rfl: 1   rosuvastatin (CRESTOR) 20 MG tablet, TAKE 1 TABLET(20 MG) BY MOUTH DAILY, Disp: 90 tablet, Rfl: 3   traZODone (DESYREL) 50 MG tablet, TAKE 1 TABLET BY MOUTH EVERY NIGHT AT BEDTIME, Disp: 90 tablet, Rfl: 1   TRULICITY 1.5 TK/1.6WF SOPN, ADMINISTER 1.5 MG UNDER THE SKIN 1 TIME A WEEK, Disp: 6 mL, Rfl: 3   Vitamin A 3 MG (10000 UT) TABS, Take 1 tablet by mouth daily. 2,400 mcg, Disp: , Rfl:    Vitamin D, Ergocalciferol, (DRISDOL) 1.25 MG (50000 UNIT) CAPS capsule, TAKE 1 CAPSULE BY MOUTH EVERY 7 DAYS, Disp: 12 capsule, Rfl: 1   vitamin E 600 UNIT capsule, Take 400 Units by mouth daily., Disp: , Rfl:    Objective There were no vitals filed for this visit.   Gen: NAD CV: S1 S2 RRR Lungs: Clear to auscultation bilaterally Abd: soft, nontender   Previous Pelvic Exam showed: Pelvic Exam: Normal external female genitalia; Bartholin's and Skene's glands normal in appearance;  urethral meatus normal in appearance, no urethral masses or discharge.    CST: negative   Speculum exam reveals normal vaginal mucosa with atrophy. Cervix normal appearance. Uterus normal single, nontender. Adnexa no mass, fullness, tenderness.     Pelvic floor strength I/V, puborectalis I/V external anal sphincter I/V   Pelvic floor musculature: Right  levator non-tender, Right obturator non-tender, Left levator non-tender, Left obturator non-tender   POP-Q:    POP-Q   -1                                            Aa   -1                                           Ba   -5.5                                              C    3                                            Gh   1.5                                            Pb   7.5                                            tvl    -2.5                                            Ap   -2.5                                            Bp   -7                                              D          Rectal Exam:  Normal sphincter tone, minimal perineal body, no distal rectocele, enterocoele not present, no rectal masses, no sign of dyssynergia when asking the patient to bear down.   Assessment/ Plan  Assessment: The patient is a 72 y.o. year old scheduled to undergo Exam under anesthesia, anterior repair, sacrospinous hysteropexy, cystoscopy. Verbal consent was obtained for these procedures.

## 2022-07-30 ENCOUNTER — Ambulatory Visit: Payer: Medicare HMO | Attending: Cardiology

## 2022-07-30 DIAGNOSIS — I25119 Atherosclerotic heart disease of native coronary artery with unspecified angina pectoris: Secondary | ICD-10-CM | POA: Diagnosis not present

## 2022-07-30 DIAGNOSIS — R079 Chest pain, unspecified: Secondary | ICD-10-CM | POA: Diagnosis not present

## 2022-07-31 LAB — EXERCISE TOLERANCE TEST
Angina Index: 0
Duke Treadmill Score: 5
Estimated workload: 7
Exercise duration (min): 5 min
Exercise duration (sec): 8 s
MPHR: 149 {beats}/min
Peak HR: 114 {beats}/min
Percent HR: 76 %
RPE: 16
Rest HR: 71 {beats}/min
ST Depression (mm): 0 mm

## 2022-08-11 ENCOUNTER — Encounter: Payer: Self-pay | Admitting: *Deleted

## 2022-08-14 ENCOUNTER — Encounter (HOSPITAL_BASED_OUTPATIENT_CLINIC_OR_DEPARTMENT_OTHER): Payer: Self-pay | Admitting: Obstetrics and Gynecology

## 2022-08-14 NOTE — Progress Notes (Addendum)
Addendum:  Chart reviewed w/ anesthesia , Dr Kalman Shan MDA, via phone.  Stated ok to proceed even though pt had her trulicity this past Tuesday.   Spoke w/ via phone for pre-op interview--- pt Lab needs dos----  [employer] results------ current EKG in epic/ chart COVID test -----patient states asymptomatic no test needed Arrive at ------- 1030 on 08-18-2022 NPO after MN NO Solid Food.  Clear liquids from MN until--- 1000 Med rec completed Medications to take morning of surgery ----- lopressor, norvasc, prilosec Diabetic medication ----- no farxiga and no insulin morning of surgery Patient instructed no nail polish to be worn day of surgery Patient instructed to bring photo id and insurance card day of surgery Patient aware to have Driver (ride ) / caregiver    for 24 hours after surgery -- sister, carolyn Patient Special Instructions ----- pt stated she does trulicity on Tuesday's, last dose 08-12-2022, surgery is on Monday 08-18-2022.  Pt stated she does not remember being told instructions to stop week prior to surgery but she had not look at her written instructions given to her from office so unsure if on them or if asa was on them either. Pre-Op special Istructions -----  pt has cardiac clearance by Dr Percival Spanish 07-21-2022 office note pt had ETT on 07-30-2022 and Dr Warren Lacy wrote result note pt cleared to have surgery. Patient verbalized understanding of instructions that were given at this phone interview. Patient denies shortness of breath, chest pain, fever, cough at this phone interview.   Anesthesia Review: HTN;  CAD s/p PCI and DES to RCA 2005 ;  OSA no cpap pt choice;  hx hep c treated in 2017 Pt denies cardiac s&s, sob, and no peripheral swelling  PCP:  Dr Alfonso Patten. [place] Cardiologist : Dr Percival Spanish [facility] 07-21-2022) Chest x-ray :  CT 09-20-2021 EKG : 07-21-2022 Echo : 04-19-2019 Stress test: ETT 07-30-2022/  nuclear 05-26-2012 Cardiac Cath :  04-20-2006 Activity level:  denies sob w/ normal acitivity Sleep Study/ CPAP : yes/ no Fasting Blood Sugar : 101     / Checks Blood Sugar -- times a day:  multiple times Blood Thinner/ Instructions /Last Dose: no ASA / Instructions/ Last Dose : ASA 76m/  pt stated she has not look at her written instructions from dr schroeder office about stopping asa, she will look at them today.  Dr hochrein did not have asa instructions in his note

## 2022-08-17 NOTE — Anesthesia Preprocedure Evaluation (Signed)
Anesthesia Evaluation  Patient identified by MRN, date of birth, ID band Patient awake    Reviewed: Allergy & Precautions, NPO status , Patient's Chart, lab work & pertinent test results  Airway Mallampati: II  TM Distance: >3 FB Neck ROM: Full    Dental no notable dental hx. (+) Upper Dentures, Lower Dentures   Pulmonary COPD, former smoker   Pulmonary exam normal breath sounds clear to auscultation       Cardiovascular hypertension, + CAD and + Cardiac Stents (DES 2005)  Normal cardiovascular exam Rhythm:Regular Rate:Normal  1. Left ventricular ejection fraction, by visual estimation, is 60 to  65%.     Neuro/Psych  Neuromuscular disease    GI/Hepatic   Endo/Other  diabetes    Renal/GU      Musculoskeletal  (+) Arthritis ,    Abdominal   Peds  Hematology   Anesthesia Other Findings All: latex  Reproductive/Obstetrics                             Anesthesia Physical Anesthesia Plan  ASA: 3  Anesthesia Plan: General   Post-op Pain Management: Ofirmev IV (intra-op)* and Tylenol PO (pre-op)*   Induction: Intravenous  PONV Risk Score and Plan: Treatment may vary due to age or medical condition, Ondansetron, Midazolam and Dexamethasone  Airway Management Planned: Oral ETT  Additional Equipment: None  Intra-op Plan:   Post-operative Plan: Extubation in OR  Informed Consent:      Dental advisory given  Plan Discussed with:   Anesthesia Plan Comments: (Pt on GLP 1 agonist)       Anesthesia Quick Evaluation

## 2022-08-18 ENCOUNTER — Ambulatory Visit (HOSPITAL_BASED_OUTPATIENT_CLINIC_OR_DEPARTMENT_OTHER)
Admission: RE | Admit: 2022-08-18 | Discharge: 2022-08-18 | Disposition: A | Discharge status: Home / Self Care | Payer: Medicare HMO | Attending: Obstetrics and Gynecology | Admitting: Obstetrics and Gynecology

## 2022-08-18 ENCOUNTER — Other Ambulatory Visit: Payer: Self-pay | Admitting: Internal Medicine

## 2022-08-18 ENCOUNTER — Encounter (HOSPITAL_BASED_OUTPATIENT_CLINIC_OR_DEPARTMENT_OTHER)
Admission: RE | Disposition: A | Discharge status: Home / Self Care | Payer: Self-pay | Source: Home / Self Care | Attending: Obstetrics and Gynecology

## 2022-08-18 ENCOUNTER — Other Ambulatory Visit: Payer: Self-pay

## 2022-08-18 ENCOUNTER — Ambulatory Visit (HOSPITAL_BASED_OUTPATIENT_CLINIC_OR_DEPARTMENT_OTHER): Payer: Medicare HMO | Admitting: Anesthesiology

## 2022-08-18 ENCOUNTER — Encounter (HOSPITAL_BASED_OUTPATIENT_CLINIC_OR_DEPARTMENT_OTHER): Payer: Self-pay | Admitting: Obstetrics and Gynecology

## 2022-08-18 DIAGNOSIS — Z7984 Long term (current) use of oral hypoglycemic drugs: Secondary | ICD-10-CM | POA: Insufficient documentation

## 2022-08-18 DIAGNOSIS — Z87891 Personal history of nicotine dependence: Secondary | ICD-10-CM | POA: Diagnosis not present

## 2022-08-18 DIAGNOSIS — N393 Stress incontinence (female) (male): Secondary | ICD-10-CM | POA: Insufficient documentation

## 2022-08-18 DIAGNOSIS — N8110 Cystocele, unspecified: Secondary | ICD-10-CM | POA: Diagnosis not present

## 2022-08-18 DIAGNOSIS — G709 Myoneural disorder, unspecified: Secondary | ICD-10-CM | POA: Insufficient documentation

## 2022-08-18 DIAGNOSIS — N812 Incomplete uterovaginal prolapse: Secondary | ICD-10-CM | POA: Diagnosis not present

## 2022-08-18 DIAGNOSIS — Z794 Long term (current) use of insulin: Secondary | ICD-10-CM | POA: Diagnosis not present

## 2022-08-18 DIAGNOSIS — I10 Essential (primary) hypertension: Secondary | ICD-10-CM

## 2022-08-18 DIAGNOSIS — Z01818 Encounter for other preprocedural examination: Secondary | ICD-10-CM

## 2022-08-18 DIAGNOSIS — J449 Chronic obstructive pulmonary disease, unspecified: Secondary | ICD-10-CM | POA: Insufficient documentation

## 2022-08-18 DIAGNOSIS — Z79899 Other long term (current) drug therapy: Secondary | ICD-10-CM | POA: Insufficient documentation

## 2022-08-18 DIAGNOSIS — N816 Rectocele: Secondary | ICD-10-CM

## 2022-08-18 DIAGNOSIS — Z955 Presence of coronary angioplasty implant and graft: Secondary | ICD-10-CM | POA: Insufficient documentation

## 2022-08-18 DIAGNOSIS — I2510 Atherosclerotic heart disease of native coronary artery without angina pectoris: Secondary | ICD-10-CM | POA: Insufficient documentation

## 2022-08-18 DIAGNOSIS — G4730 Sleep apnea, unspecified: Secondary | ICD-10-CM | POA: Insufficient documentation

## 2022-08-18 DIAGNOSIS — M1990 Unspecified osteoarthritis, unspecified site: Secondary | ICD-10-CM | POA: Insufficient documentation

## 2022-08-18 DIAGNOSIS — E785 Hyperlipidemia, unspecified: Secondary | ICD-10-CM | POA: Insufficient documentation

## 2022-08-18 DIAGNOSIS — E119 Type 2 diabetes mellitus without complications: Secondary | ICD-10-CM | POA: Diagnosis not present

## 2022-08-18 DIAGNOSIS — E1151 Type 2 diabetes mellitus with diabetic peripheral angiopathy without gangrene: Secondary | ICD-10-CM | POA: Insufficient documentation

## 2022-08-18 HISTORY — DX: Long term (current) use of insulin: E11.9

## 2022-08-18 HISTORY — DX: Presence of spectacles and contact lenses: Z97.3

## 2022-08-18 HISTORY — DX: Personal history of diseases of the blood and blood-forming organs and certain disorders involving the immune mechanism: Z86.2

## 2022-08-18 HISTORY — PX: CYSTOSCOPY: SHX5120

## 2022-08-18 HISTORY — DX: Obstructive sleep apnea (adult) (pediatric): G47.33

## 2022-08-18 HISTORY — DX: Incomplete uterovaginal prolapse: N81.2

## 2022-08-18 HISTORY — DX: Type 2 diabetes mellitus without complications: Z79.4

## 2022-08-18 HISTORY — DX: Polyneuropathy, unspecified: G62.9

## 2022-08-18 HISTORY — DX: Presence of dental prosthetic device (complete) (partial): Z97.2

## 2022-08-18 HISTORY — DX: Chronic obstructive pulmonary disease, unspecified: J44.9

## 2022-08-18 HISTORY — PX: ANTERIOR AND POSTERIOR REPAIR WITH SACROSPINOUS FIXATION: SHX6536

## 2022-08-18 HISTORY — DX: Chronic kidney disease, stage 2 (mild): N18.2

## 2022-08-18 HISTORY — DX: Personal history of other infectious and parasitic diseases: Z86.19

## 2022-08-18 HISTORY — DX: Cystocele, unspecified: N81.10

## 2022-08-18 HISTORY — DX: Complete loss of teeth, unspecified cause, unspecified class: K08.109

## 2022-08-18 HISTORY — DX: Diverticulosis of large intestine without perforation or abscess without bleeding: K57.30

## 2022-08-18 LAB — GLUCOSE, CAPILLARY: Glucose-Capillary: 125 mg/dL — ABNORMAL HIGH (ref 70–99)

## 2022-08-18 LAB — POCT I-STAT, CHEM 8
BUN: 11 mg/dL (ref 8–23)
Calcium, Ion: 1.13 mmol/L — ABNORMAL LOW (ref 1.15–1.40)
Chloride: 105 mmol/L (ref 98–111)
Creatinine, Ser: 0.6 mg/dL (ref 0.44–1.00)
Glucose, Bld: 95 mg/dL (ref 70–99)
HCT: 46 % (ref 36.0–46.0)
Hemoglobin: 15.6 g/dL — ABNORMAL HIGH (ref 12.0–15.0)
Potassium: 3.6 mmol/L (ref 3.5–5.1)
Sodium: 144 mmol/L (ref 135–145)
TCO2: 28 mmol/L (ref 22–32)

## 2022-08-18 SURGERY — ANTERIOR AND POSTERIOR REPAIR WITH SACROSPINOUS FIXATION
Anesthesia: General | Site: Vagina

## 2022-08-18 MED ORDER — ENOXAPARIN SODIUM 40 MG/0.4ML IJ SOSY
PREFILLED_SYRINGE | INTRAMUSCULAR | Status: AC
  Filled 2022-08-18: qty 0.4

## 2022-08-18 MED ORDER — PROPOFOL 10 MG/ML IV BOLUS
INTRAVENOUS | Status: AC
  Filled 2022-08-18: qty 20

## 2022-08-18 MED ORDER — ONDANSETRON HCL 4 MG/2ML IJ SOLN
INTRAMUSCULAR | Status: AC
  Filled 2022-08-18: qty 2

## 2022-08-18 MED ORDER — ENOXAPARIN SODIUM 40 MG/0.4ML IJ SOSY
40.0000 mg | PREFILLED_SYRINGE | INTRAMUSCULAR | Status: AC
  Administered 2022-08-18: 40 mg via SUBCUTANEOUS

## 2022-08-18 MED ORDER — LIDOCAINE HCL (PF) 2 % IJ SOLN
INTRAMUSCULAR | Status: AC
  Filled 2022-08-18: qty 5

## 2022-08-18 MED ORDER — CEFAZOLIN SODIUM-DEXTROSE 2-4 GM/100ML-% IV SOLN
2.0000 g | INTRAVENOUS | Status: AC
  Administered 2022-08-18: 2 g via INTRAVENOUS

## 2022-08-18 MED ORDER — LIDOCAINE 2% (20 MG/ML) 5 ML SYRINGE
INTRAMUSCULAR | Status: DC | PRN
  Administered 2022-08-18: 80 mg via INTRAVENOUS

## 2022-08-18 MED ORDER — FLUORESCEIN SODIUM 10 % IV SOLN
INTRAVENOUS | Status: AC
  Filled 2022-08-18: qty 5

## 2022-08-18 MED ORDER — FENTANYL CITRATE (PF) 100 MCG/2ML IJ SOLN
INTRAMUSCULAR | Status: AC
  Filled 2022-08-18: qty 2

## 2022-08-18 MED ORDER — 0.9 % SODIUM CHLORIDE (POUR BTL) OPTIME
TOPICAL | Status: DC | PRN
  Administered 2022-08-18: 500 mL

## 2022-08-18 MED ORDER — SUCCINYLCHOLINE CHLORIDE 200 MG/10ML IV SOSY
PREFILLED_SYRINGE | INTRAVENOUS | Status: DC | PRN
  Administered 2022-08-18: 100 mg via INTRAVENOUS

## 2022-08-18 MED ORDER — SODIUM CHLORIDE 0.9 % IR SOLN
Status: DC | PRN
  Administered 2022-08-18: 300 mL via INTRAVESICAL

## 2022-08-18 MED ORDER — FENTANYL CITRATE (PF) 100 MCG/2ML IJ SOLN
25.0000 ug | INTRAMUSCULAR | Status: DC | PRN
  Administered 2022-08-18: 25 ug via INTRAVENOUS

## 2022-08-18 MED ORDER — ACETAMINOPHEN 10 MG/ML IV SOLN
INTRAVENOUS | Status: AC
  Filled 2022-08-18: qty 100

## 2022-08-18 MED ORDER — OXYCODONE HCL 5 MG PO TABS
ORAL_TABLET | ORAL | Status: AC
  Filled 2022-08-18: qty 1

## 2022-08-18 MED ORDER — LIDOCAINE-EPINEPHRINE 1 %-1:100000 IJ SOLN
INTRAMUSCULAR | Status: DC | PRN
  Administered 2022-08-18: 7 mL
  Administered 2022-08-18: 20 mL

## 2022-08-18 MED ORDER — PHENYLEPHRINE 80 MCG/ML (10ML) SYRINGE FOR IV PUSH (FOR BLOOD PRESSURE SUPPORT)
PREFILLED_SYRINGE | INTRAVENOUS | Status: AC
  Filled 2022-08-18: qty 10

## 2022-08-18 MED ORDER — OXYCODONE HCL 5 MG PO TABS
5.0000 mg | ORAL_TABLET | Freq: Once | ORAL | Status: AC | PRN
  Administered 2022-08-18: 5 mg via ORAL

## 2022-08-18 MED ORDER — OXYCODONE HCL 5 MG/5ML PO SOLN: 5.0000 mg | Freq: Once | ORAL | Status: AC | PRN

## 2022-08-18 MED ORDER — PROPOFOL 10 MG/ML IV BOLUS
INTRAVENOUS | Status: DC | PRN
  Administered 2022-08-18: 160 mg via INTRAVENOUS

## 2022-08-18 MED ORDER — ONDANSETRON HCL 4 MG/2ML IJ SOLN: 4.0000 mg | Freq: Once | INTRAMUSCULAR | Status: DC | PRN

## 2022-08-18 MED ORDER — DEXAMETHASONE SODIUM PHOSPHATE 10 MG/ML IJ SOLN
INTRAMUSCULAR | Status: AC
  Filled 2022-08-18: qty 1

## 2022-08-18 MED ORDER — ONDANSETRON HCL 4 MG/2ML IJ SOLN
INTRAMUSCULAR | Status: DC | PRN
  Administered 2022-08-18: 4 mg via INTRAVENOUS

## 2022-08-18 MED ORDER — ACETAMINOPHEN 10 MG/ML IV SOLN: 1000.0000 mg | Freq: Once | INTRAVENOUS | Status: DC | PRN

## 2022-08-18 MED ORDER — CEFAZOLIN SODIUM-DEXTROSE 2-4 GM/100ML-% IV SOLN
INTRAVENOUS | Status: AC
  Filled 2022-08-18: qty 100

## 2022-08-18 MED ORDER — DEXAMETHASONE SODIUM PHOSPHATE 10 MG/ML IJ SOLN
INTRAMUSCULAR | Status: DC | PRN
  Administered 2022-08-18: 8 mg via INTRAVENOUS

## 2022-08-18 MED ORDER — HEMOSTATIC AGENTS (NO CHARGE) OPTIME
TOPICAL | Status: DC | PRN
  Administered 2022-08-18: 1

## 2022-08-18 MED ORDER — LACTATED RINGERS IV SOLN
INTRAVENOUS | Status: DC
  Administered 2022-08-18: 1000 mL via INTRAVENOUS

## 2022-08-18 MED ORDER — FENTANYL CITRATE (PF) 100 MCG/2ML IJ SOLN
INTRAMUSCULAR | Status: DC | PRN
  Administered 2022-08-18: 25 ug via INTRAVENOUS
  Administered 2022-08-18: 75 ug via INTRAVENOUS

## 2022-08-18 MED ORDER — ACETAMINOPHEN 10 MG/ML IV SOLN
INTRAVENOUS | Status: DC | PRN
  Administered 2022-08-18: 1000 mg via INTRAVENOUS

## 2022-08-18 SURGICAL SUPPLY — 42 items
AGENT HMST KT MTR STRL THRMB (HEMOSTASIS) ×2
BLADE CLIPPER SENSICLIP SURGIC (BLADE) IMPLANT
BLADE SURG 15 STRL LF DISP TIS (BLADE) ×2 IMPLANT
BLADE SURG 15 STRL SS (BLADE) ×2
DEVICE CAPIO SLIM SINGLE (INSTRUMENTS) IMPLANT
ELECT REM PT RETURN 9FT ADLT (ELECTROSURGICAL) ×2
ELECTRODE REM PT RTRN 9FT ADLT (ELECTROSURGICAL) IMPLANT
GAUZE 4X4 16PLY ~~LOC~~+RFID DBL (SPONGE) IMPLANT
GLOVE BIOGEL PI IND STRL 6.5 (GLOVE) ×2 IMPLANT
GLOVE BIOGEL PI IND STRL 7.0 (GLOVE) ×2 IMPLANT
GLOVE ECLIPSE 6.0 STRL STRAW (GLOVE) ×2 IMPLANT
GOWN STRL REUS W/TWL LRG LVL3 (GOWN DISPOSABLE) ×2 IMPLANT
HIBICLENS CHG 4% 4OZ BTL (MISCELLANEOUS) ×2 IMPLANT
HOLDER FOLEY CATH W/STRAP (MISCELLANEOUS) ×2 IMPLANT
KIT TURNOVER CYSTO (KITS) ×2 IMPLANT
MANIFOLD NEPTUNE II (INSTRUMENTS) ×2 IMPLANT
NDL HYPO 22X1.5 SAFETY MO (MISCELLANEOUS) ×2 IMPLANT
NDL MAYO 6 CRC TAPER PT (NEEDLE) IMPLANT
NEEDLE HYPO 22X1.5 SAFETY MO (MISCELLANEOUS) ×2 IMPLANT
NEEDLE MAYO 6 CRC TAPER PT (NEEDLE) IMPLANT
NEEDLE SAFETY HYPO 22GAX1.5 (MISCELLANEOUS) ×2
NS IRRIG 500ML POUR BTL (IV SOLUTION) IMPLANT
PACK CYSTO (CUSTOM PROCEDURE TRAY) ×2 IMPLANT
PACK VAGINAL WOMENS (CUSTOM PROCEDURE TRAY) ×2 IMPLANT
PAD OB MATERNITY 4.3X12.25 (PERSONAL CARE ITEMS) ×2 IMPLANT
RETRACTOR LONE STAR DISPOSABLE (INSTRUMENTS) ×2 IMPLANT
RETRACTOR STAY HOOK 5MM (MISCELLANEOUS) ×2 IMPLANT
SET IRRIG Y TYPE TUR BLADDER L (SET/KITS/TRAYS/PACK) ×2 IMPLANT
SLEEVE SCD COMPRESS KNEE MED (STOCKING) ×2 IMPLANT
SPIKE FLUID TRANSFER (MISCELLANEOUS) IMPLANT
SUCTION FRAZIER HANDLE 10FR (MISCELLANEOUS) ×2
SUCTION TUBE FRAZIER 10FR DISP (MISCELLANEOUS) IMPLANT
SURGIFLO W/THROMBIN 8M KIT (HEMOSTASIS) IMPLANT
SUT ABS MONO DBL WITH NDL 48IN (SUTURE) IMPLANT
SUT VIC AB 0 CT1 27 (SUTURE) ×2
SUT VIC AB 0 CT1 27XBRD ANBCTR (SUTURE) IMPLANT
SUT VIC AB 2-0 SH 27 (SUTURE)
SUT VIC AB 2-0 SH 27XBRD (SUTURE) IMPLANT
SUT VICRYL 2-0 SH 8X27 (SUTURE) ×2 IMPLANT
SYR BULB EAR ULCER 3OZ GRN STR (SYRINGE) ×2 IMPLANT
TOWEL OR 17X24 6PK STRL BLUE (TOWEL DISPOSABLE) ×2 IMPLANT
TRAY FOLEY W/BAG SLVR 14FR LF (SET/KITS/TRAYS/PACK) ×2 IMPLANT

## 2022-08-18 NOTE — Anesthesia Procedure Notes (Signed)
Procedure Name: Intubation Date/Time: 08/18/2022 12:35 PM  Performed by: Erminia Mcnew D, CRNAPre-anesthesia Checklist: Patient identified, Emergency Drugs available, Suction available and Patient being monitored Patient Re-evaluated:Patient Re-evaluated prior to induction Oxygen Delivery Method: Circle system utilized Preoxygenation: Pre-oxygenation with 100% oxygen Induction Type: IV induction Ventilation: Mask ventilation without difficulty Laryngoscope Size: Mac and 3 Grade View: Grade I Tube type: Oral Tube size: 7.0 mm Number of attempts: 1 Airway Equipment and Method: Stylet and Oral airway Placement Confirmation: ETT inserted through vocal cords under direct vision, positive ETCO2 and breath sounds checked- equal and bilateral Secured at: 21 cm Tube secured with: Tape Dental Injury: Teeth and Oropharynx as per pre-operative assessment

## 2022-08-18 NOTE — Discharge Instructions (Addendum)

## 2022-08-18 NOTE — Interval H&P Note (Signed)
History and Physical Interval Note:  08/18/2022 12:02 PM  [name] [name]  has presented today for surgery, with the diagnosis of anterior vaginal prolapse; uterovaginal prolapse, incomplete.  The various methods of treatment have been discussed with the patient and family. After consideration of risks, benefits and other options for treatment, the patient has consented to  Procedure(s) with comments: ANTERIOR REPAIR WITH SACROSPINOUS FIXATION (N/A)  CYSTOSCOPY (N/A) as a surgical intervention.  The patient's history has been reviewed, patient examined, no change in status, stable for surgery.  I have reviewed the patient's chart and labs.  Questions were answered to the patient's satisfaction.     [name] [name]

## 2022-08-18 NOTE — Op Note (Signed)
Operative Note  Preoperative Diagnosis: anterior vaginal prolapse, posterior vaginal prolapse, and uterovaginal prolapse, incomplete  Postoperative Diagnosis: same  Procedures performed:  Anterior and posterior repair with perineorrhaphy, sacrospinous hysteropexy, cystoscopy  Implants: none  Attending Surgeon: Sherlene Shams, MD  Anesthesia: General LMA  Findings: 1. On vaginal exam, stage II prolapse noted  2. On cystoscopy, normal bladder and urethra without injury, lesion or foreign body. Brisk bilateral ureteral efflux noted.    Specimens: * No specimens in log *  Estimated blood loss: 100 mL  IV fluids: 850 mL  Urine output: [id number] mL  Complications: none  Procedure in Detail:  After informed consent was obtained, the patient was taken to the operating room where anesthesia was induced and found to be adequate. She was placed in dorsal lithotomy position, taking care to avoid any traction on the extremities, and then prepped and draped in the usual sterile fashion. A self-retaining lonestar retractor was placed using four elastic blue stays.  After a foley catheter was inserted into the urethra, the location of the midurethra was palpated. Two Allis clamps were along the anterior vaginal wall defect. 1% lidocaine with epinephrine was injected into the vaginal mucosa.  A vertical incision was made between these two Allis clamps with a 15 blade scalpel.  Allis clamps were placed along this incision and Metzenbaum scissors were used to undermine the vaginal mucosa along the incision.  The vaginal mucosa was then sharply dissected off to the vesicovaginal septum bilaterally to the level of the pubic rami.    For the sacrospinous ligament fixation (SSLF), the ischial spine was accessed on the right side via dissection with Mayo scissors and blunt dissection.  The sacrospinous ligament was palpated. Two 0 PDS suture was then placed at the sacrospinous ligament two fingerbreadths  medial to the ischial spine, in order to avoid the pudendal neurovascular bundle, using a Capio needle driver.  The PDS suture was attached to the anterior cervix and held. Anterior plication of the vesicovaginal septum was then performed using plicating sutures of 2-0 Vicryl. The vaginal mucosal edges were trimmed and the incision reapproximated with 2-0 Vicryl in a running fashion. The SSLF suture was then tied down with excellent support of the anterior and apical vagina.   The Foley catheter was removed.  A 70-degree cystoscope was introduced, and 360-degree inspection revealed no trauma to the bladder, with bilateral ureteral efflux.  The bladder was drained and the cystoscope was removed.  The Foley catheter was reinserted.   Exam indicated that a small posterior repair was needed.   Two Allis clamps were in the midline of the posterior vaginal wall defect and at the introitus.  1% lidocaine with epinephrine was injected into the vaginal mucosa and perineum. A vertical incision was made between these clamps with a 15 blade scalpel and a diamond shaped incision was made over the perineum.  The rectovaginal septum was then dissected off the vaginal mucosa bilaterally.  The rectovaginal septum was then plicated with plicating sutures of 2-0 Vicryl.    The last distal stitch incorporated the perineal body in a U stitch fashion.  The perineal body was reapproximated with two interrupted 0-vicryl sutures.  The vaginal mucosa and perineal skin was reapproximated using 2-0 Vicryl sutures in a running fashion.  The vagina was copiously irrigated.  Hemostasis was noted.  Vaginal packing was not placed.  A rectal examination was normal and confirmed no sutures within the rectum.  The patient tolerated the procedure well.  She was awakened from anesthesia and transferred to the recovery room in stable condition. All counts were correct x 2.    Jaquita Folds, MD

## 2022-08-18 NOTE — Transfer of Care (Signed)
Immediate Anesthesia Transfer of Care Note  Patient: [name] [name]  Procedure(s) Performed: ANTERIOR AND POSTERIOR REPAIR, PERINEORRHAPHY WITH SACROSPINOUS FIXATION (Vagina ) CYSTOSCOPY (Bladder)  Patient Location: PACU  Anesthesia Type:General  Level of Consciousness: awake, alert , and oriented  Airway & Oxygen Therapy: Patient Spontanous Breathing and Patient connected to nasal cannula oxygen  Post-op Assessment: Report given to RN and Post -op Vital signs reviewed and stable  Post vital signs: Reviewed and stable  Last Vitals:  Vitals Value Taken Time  BP 126/80 08/18/22 1358  Temp    Pulse 74 08/18/22 1359  Resp 14 08/18/22 1400  SpO2 99 % 08/18/22 1359  Vitals shown include unvalidated device data.  Last Pain:  Vitals:   08/18/22 1108  TempSrc:   PainSc: 1       Patients Stated Pain Goal: 6 ([id number])  Complications: No notable events documented.

## 2022-08-18 NOTE — Anesthesia Postprocedure Evaluation (Signed)
Anesthesia Post Note  Patient: [name] [name]  Procedure(s) Performed: ANTERIOR AND POSTERIOR REPAIR, PERINEORRHAPHY WITH SACROSPINOUS FIXATION (Vagina ) CYSTOSCOPY (Bladder)     Patient location during evaluation: PACU Anesthesia Type: General Level of consciousness: awake and alert Pain management: pain level controlled Vital Signs Assessment: post-procedure vital signs reviewed and stable Respiratory status: spontaneous breathing, nonlabored ventilation, respiratory function stable and patient connected to nasal cannula oxygen Cardiovascular status: blood pressure returned to baseline and stable Postop Assessment: no apparent nausea or vomiting Anesthetic complications: no  No notable events documented.  Last Vitals:  Vitals:   08/18/22 1500 08/18/22 1746  BP: 139/76 116/71  Pulse: 69 77  Resp: 15 15  Temp:  37.2 C  SpO2: 98% 99%    Last Pain:  Vitals:   08/18/22 1746  TempSrc:   PainSc: 3                  Barnet Glasgow

## 2022-08-19 ENCOUNTER — Encounter (HOSPITAL_BASED_OUTPATIENT_CLINIC_OR_DEPARTMENT_OTHER): Payer: Self-pay | Admitting: Obstetrics and Gynecology

## 2022-08-19 ENCOUNTER — Telehealth: Payer: Self-pay | Admitting: Obstetrics and Gynecology

## 2022-08-19 NOTE — Telephone Encounter (Signed)
[name] [name] underwent Anterior and posterior repair with perineorrhaphy, sacrospinous hysteropexy, cystoscopy  on 08/19/22.   She failed her voiding trial.  336m was backfilled into the bladder She was unable to void.  She was discharged with a catheter. Please call her for a routine post op check and to schedule a voiding trial by Thursday or Friday. Thanks!  MJaquita Folds MD

## 2022-08-20 NOTE — Telephone Encounter (Signed)
Patient reports pain 3/10 in the right buttock but has been taking Tylenol and ibuprofen for pain.  Has a catheter and plan to come tomorrow at 1pm for voiding trial.  Has not yet had a bowel movement and was not taking her Miralax, reports will take a dose once she is out of the shower.  Reports minor spots of blood. Denies clots or heavy bleeding.  Patient to call back with any issues.

## 2022-08-21 ENCOUNTER — Ambulatory Visit (INDEPENDENT_AMBULATORY_CARE_PROVIDER_SITE_OTHER): Payer: Medicare HMO

## 2022-08-21 DIAGNOSIS — Z9889 Other specified postprocedural states: Secondary | ICD-10-CM

## 2022-08-21 NOTE — Patient Instructions (Signed)
Please keep all scheduled post op and voiding trial appointment.

## 2022-08-21 NOTE — Progress Notes (Signed)
Urogyn Nurse Voiding Trial Note  [name] [name] underwent ANTERIOR AND POSTERIOR REPAIR, PERINEORRHAPHY WITH SACROSPINOUS FIXATION and cystoscopy on 08/18/22.  She presents today for a voiding trial .   Patient was identified with 2 indicators. 463m of NS was instilled into the bladder via the catheter. The catheter was removed and patient was instructed to void into the urinary hat. She voided 1078m The post void residual measured by bladder scan was 34732mShe failed the voiding trial and a catheter was replaced.   The patient received aftercare instructions and will follow up as scheduled.

## 2022-08-25 ENCOUNTER — Ambulatory Visit (INDEPENDENT_AMBULATORY_CARE_PROVIDER_SITE_OTHER): Payer: Medicare HMO

## 2022-08-25 DIAGNOSIS — Z9889 Other specified postprocedural states: Secondary | ICD-10-CM

## 2022-08-25 NOTE — Patient Instructions (Addendum)
Please keep your next voiding trial appoint ment on 09/01/22 at 11am.

## 2022-08-25 NOTE — Progress Notes (Signed)
[name] [name] is a 72 y.o. female here for a voiding trial. Pt said she was concerned because her urine bag had a little stream of blood in it and ir did not looks as yellow as it did before.  Urine looks clear from my observation (yellow and clear).   12 fr cath removed with no complications.  Instilled: 300cc Voided: 50cc PVR: 250  Betadine used to clean. 27f cath was replaced. Pt was given overnight back and cleaning instructions. Pt will come back for a voiding trial in 1 week. .Marland Kitchen

## 2022-08-26 ENCOUNTER — Other Ambulatory Visit: Payer: Self-pay | Admitting: Obstetrics and Gynecology

## 2022-08-26 DIAGNOSIS — Z01818 Encounter for other preprocedural examination: Secondary | ICD-10-CM

## 2022-09-01 ENCOUNTER — Ambulatory Visit (INDEPENDENT_AMBULATORY_CARE_PROVIDER_SITE_OTHER): Payer: Medicare HMO

## 2022-09-01 DIAGNOSIS — Z9889 Other specified postprocedural states: Secondary | ICD-10-CM

## 2022-09-01 NOTE — Patient Instructions (Addendum)
Please keep your Post Op appointment on 09/29/2022 at 9:20am. Feel free to call us if you have any question and concerns or message [place] on MyChart.

## 2022-09-01 NOTE — Progress Notes (Addendum)
[name] [name] is a 72 y.o. female here for her 3rd voiding trial after surgery  Instilled: 300 Voided: 100 PVR: 271  Pt spoke to Dr. Wannetta Sender and a plan to self- cath is now in place. Pt was taught to self cath using a 14 fr catheter using lubricant.  Pt was able to demonstrate self cathing herself. Pt cathed 250cc Catheter samples given and a DME for Cath supplies  sent to [facility] [employer].   Pt was instructed to cath each time she urinates.  Pt will f/u during her post op visit.

## 2022-09-02 ENCOUNTER — Encounter: Payer: Self-pay | Admitting: Internal Medicine

## 2022-09-02 ENCOUNTER — Ambulatory Visit: Payer: Medicare HMO | Admitting: Internal Medicine

## 2022-09-02 VITALS — BP 140/86 | HR 84 | Temp 98.6°F | Ht 65.0 in | Wt 169.0 lb

## 2022-09-02 DIAGNOSIS — I700 Atherosclerosis of aorta: Secondary | ICD-10-CM

## 2022-09-02 DIAGNOSIS — B182 Chronic viral hepatitis C: Secondary | ICD-10-CM

## 2022-09-02 DIAGNOSIS — Z6828 Body mass index (BMI) 28.0-28.9, adult: Secondary | ICD-10-CM | POA: Diagnosis not present

## 2022-09-02 DIAGNOSIS — I1310 Hypertensive heart and chronic kidney disease without heart failure, with stage 1 through stage 4 chronic kidney disease, or unspecified chronic kidney disease: Secondary | ICD-10-CM | POA: Diagnosis not present

## 2022-09-02 DIAGNOSIS — N182 Chronic kidney disease, stage 2 (mild): Secondary | ICD-10-CM | POA: Diagnosis not present

## 2022-09-02 DIAGNOSIS — E1122 Type 2 diabetes mellitus with diabetic chronic kidney disease: Secondary | ICD-10-CM | POA: Diagnosis not present

## 2022-09-02 DIAGNOSIS — Z794 Long term (current) use of insulin: Secondary | ICD-10-CM | POA: Diagnosis not present

## 2022-09-02 DIAGNOSIS — E7800 Pure hypercholesterolemia, unspecified: Secondary | ICD-10-CM | POA: Diagnosis not present

## 2022-09-02 DIAGNOSIS — E663 Overweight: Secondary | ICD-10-CM | POA: Diagnosis not present

## 2022-09-02 NOTE — Patient Instructions (Signed)

## 2022-09-02 NOTE — Progress Notes (Signed)
I,[name] [name],acting as a scribe for [name], MD.,have documented all relevant documentation on the behalf of [name], MD,as directed by  [name], MD while in the presence of [name], MD.    Subjective:     Patient ID: [name] [name] , female    DOB: 11/13/1950 , 72 y.o.   MRN: [id number]   Chief Complaint  Patient presents with   Diabetes   Hypertension   Hyperlipidemia    HPI  Patient presents today for a diabetes, HTN & cholesterol check. Patient reports compliance with her meds. She denies headaches, chest pain and shortness of breath.   She reports having ANTERIOR AND POSTERIOR REPAIR, PERINEORRHAPHY WITH SACROSPINOUS FIXATION procedure on 08/18/2022.     Diabetes She presents for her follow-up diabetic visit. She has type 2 diabetes mellitus. Her disease course has been improving. Pertinent negatives for hypoglycemia include no dizziness or headaches. Pertinent negatives for diabetes include no blurred vision, no chest pain, no polydipsia, no polyphagia and no polyuria. There are no hypoglycemic complications. Risk factors for coronary artery disease include diabetes mellitus, dyslipidemia, hypertension and post-menopausal. Current diabetic treatment includes insulin injections. She is compliant with treatment most of the time. She is following a diabetic diet. She participates in exercise intermittently. Eye exam is current.  Hypertension This is a chronic problem. The current episode started more than 1 year ago. The problem has been gradually improving since onset. The problem is controlled. Pertinent negatives include no blurred vision, chest pain, headaches or palpitations. Past treatments include angiotensin blockers. The current treatment provides moderate improvement. Compliance problems include exercise and diet.  Hypertensive end-organ damage includes kidney disease. Identifiable causes of hypertension include chronic renal disease.   Hyperlipidemia This is a chronic problem. The current episode started more than 1 year ago. Exacerbating diseases include chronic renal disease. Pertinent negatives include no chest pain. Current antihyperlipidemic treatment includes statins. The current treatment provides moderate improvement of lipids. Risk factors for coronary artery disease include diabetes mellitus, dyslipidemia, hypertension, post-menopausal and a sedentary lifestyle.     Past Medical History:  Diagnosis Date   CKD (chronic kidney disease), stage II    COPD (chronic obstructive pulmonary disease) ([place])    Coronary artery disease 2005   cardiologist--- dr Warren Lacy;    cardiac cath for acute angina 05-21-2004 multivessel disease;  cath after GI consult 12/ 2005  [place]  w/ DES to prox RCA ;  cath 04-20-2006 patent stent with nonobstruction CAD;  NUC 12/ 2013 normal no ischemia;  last ETT 07-30-2022 no ischemia   Diverticulosis of colon    Dyslipidemia    Full dentures    History of anemia    History of hepatitis C virus infection    per pt was dx many yrs ago and treated in 2017  (noted in epic HCV RNA quan not detectable 10-25-2019)   Hypertension    OSA (obstructive sleep apnea)    study in epic 03-06-2019  moderate osa , (08-14-2022  pt stated she tried  cpap  but just decided to stop using it)   Osteoarthritis    PAD (peripheral artery disease) ([place])    Peripheral neuropathy    Prolapse of anterior vaginal wall    S/P primary angioplasty with coronary stent 05/2004   PTCA and DES x1 to pRCA   Type 2 diabetes mellitus treated with insulin ([place])    followed by pcp   (08-14-2022  per pt checks blood sugar  multiple times dialy w/ dexcom 7,  fasting average 101)   Uterovaginal prolapse, incomplete    Wears glasses      Family History  Problem Relation Age of Onset   CAD Father 52   Heart attack Father    CAD Mother 72   Heart attack Mother    CAD Brother    Cancer Brother        prostate   CAD Sister     Cancer Sister        breast   CAD Brother      Current Outpatient Medications:    acetaminophen (TYLENOL) 500 MG tablet, Take 1 tablet (500 mg total) by mouth every 6 (six) hours as needed (pain)., Disp: 30 tablet, Rfl: 0   amLODipine (NORVASC) 10 MG tablet, TAKE 1 TABLET(10 MG) BY MOUTH DAILY, Disp: 90 tablet, Rfl: 1   Ascorbic Acid (VITAMIN C) 500 MG CHEW, Chew by mouth daily., Disp: , Rfl:    aspirin EC 81 MG tablet, Take 81 mg by mouth daily. Swallow whole., Disp: , Rfl:    ciclopirox (PENLAC) 8 % solution, Apply one coat to toenail qd.  Remove weekly with polish remover. (Patient taking differently: Apply topically at bedtime. Apply one coat to toenail qd.  Remove weekly with polish remover.), Disp: 6.6 mL, Rfl: 11   Coenzyme Q10 (COQ-10) 100 MG CAPS, Take 1 tablet by mouth daily. , Disp: , Rfl:    Continuous Blood Gluc Sensor (DEXCOM G7 SENSOR) MISC, Use to check blood sugar 3 times a day. Dx code e11.65, Disp: 3 each, Rfl: 3   estradiol (ESTRACE) 0.1 MG/GM vaginal cream, Place 0.5 g vaginally 2 (two) times a week. Place 0.5g nightly for two weeks then twice a week after, Disp: 30 g, Rfl: 11   FARXIGA 10 MG TABS tablet, TAKE 1 TABLET(10 MG) BY MOUTH DAILY BEFORE BREAKFAST (Patient taking differently: Take 10 mg by mouth daily.), Disp: 30 tablet, Rfl: 1   gabapentin (NEURONTIN) 100 MG capsule, TAKE 1 CAPSULE(100 MG) BY MOUTH THREE TIMES DAILY, Disp: 30 capsule, Rfl: 0   Glucagon (GVOKE HYPOPEN 2-PACK) 0.5 MG/0.1ML SOAJ, Use for blood sugars less than 60. (Patient taking differently: as directed. Use for blood sugars less than 60.), Disp: 0.2 mL, Rfl: 3   glucosamine-chondroitin 500-400 MG tablet, Take 1 tablet by mouth daily., Disp: , Rfl:    glucose blood (ONETOUCH VERIO) test strip, CHECK BLOOD SUGAR THREE TIMES DAILY dx: e11.65, Disp: 150 each, Rfl: 11   ibuprofen (ADVIL) 600 MG tablet, Take 1 tablet (600 mg total) by mouth every 6 (six) hours as needed., Disp: 30 tablet, Rfl: 0    insulin lispro (HUMALOG KWIKPEN) 100 UNIT/ML KwikPen, Inject subcutaneously 2 units if sugars are 150 - 199, 4 units 200 -249, 6 units 250- 299, 8 units 300 - 349, 10 units >350 (Patient taking differently: Inject into the skin as needed. Inject subcutaneously 2 units if sugars are 150 - 199, 4 units 200 -249, 6 units 250- 299, 8 units 300 - 349, 10 units >350), Disp: 15 mL, Rfl: 0   Insulin Pen Needle (BD PEN NEEDLE NANO 2ND GEN) 32G X 4 MM MISC, USE TO INJECT INSULIN DAILY, Disp: 100 each, Rfl: 2   MAGNESIUM OXIDE PO, Take 400 mg by mouth at bedtime., Disp: , Rfl:    metoprolol tartrate (LOPRESSOR) 50 MG tablet, TAKE 1 TABLET BY MOUTH TWICE DAILY (Patient taking differently: Take 50 mg by mouth 2 (two) times daily.), Disp:  180 tablet, Rfl: 4   Misc Natural Products (NEURIVA) CAPS, Take 1 capsule by mouth daily., Disp: , Rfl:    nystatin (NYSTATIN) powder, Apply topically 3 (three) times daily. As needed (Patient taking differently: Apply topically 3 (three) times daily as needed. As needed), Disp: 60 g, Rfl: 0   omeprazole (PRILOSEC) 20 MG capsule, TAKE 1 CAPSULE BY MOUTH DAILY, Disp: 90 capsule, Rfl: 1   OneTouch Delica Lancets 99991111 MISC, USE AS DIRECTED TO CHECK BLOOD SUGAR THREE TIMES DAILY, Disp: 100 each, Rfl: 0   pregabalin (LYRICA) 50 MG capsule, TAKE 1 CAPSULE(50 MG) BY MOUTH AT BEDTIME (Patient taking differently: Take 50 mg by mouth at bedtime. TAKE 1 CAPSULE(50 MG) BY MOUTH AT BEDTIME), Disp: 90 capsule, Rfl: 1   rosuvastatin (CRESTOR) 20 MG tablet, TAKE 1 TABLET(20 MG) BY MOUTH DAILY (Patient taking differently: Take 20 mg by mouth at bedtime.), Disp: 90 tablet, Rfl: 3   traZODone (DESYREL) 50 MG tablet, TAKE 1 TABLET BY MOUTH EVERY NIGHT AT BEDTIME (Patient taking differently: Take 50 mg by mouth at bedtime.), Disp: 90 tablet, Rfl: 1   TRULICITY 1.5 0000000 SOPN, ADMINISTER 1.5 MG UNDER THE SKIN 1 TIME A WEEK (Patient taking differently: Inject 1.5 mg into the skin once a week. Tuesday),  Disp: 6 mL, Rfl: 3   Vitamin A 3 MG (10000 UT) TABS, Take 1 tablet by mouth daily. 2,400 mcg, Disp: , Rfl:    Vitamin D, Ergocalciferol, (DRISDOL) 1.25 MG (50000 UNIT) CAPS capsule, TAKE 1 CAPSULE BY MOUTH EVERY 7 DAYS (Patient taking differently: Take 50,000 Units by mouth every 7 (seven) days. Friday's), Disp: 12 capsule, Rfl: 1   vitamin E 600 UNIT capsule, Take 400 Units by mouth at bedtime., Disp: , Rfl:    Allergies  Allergen Reactions   Fish Allergy Hives    Cod   Latex Rash     Review of Systems  Constitutional: Negative.   Eyes:  Negative for blurred vision.  Respiratory: Negative.    Cardiovascular: Negative.  Negative for chest pain and palpitations.  Gastrointestinal: Negative.   Endocrine: Negative for polydipsia, polyphagia and polyuria.  Skin: Negative.   Neurological: Negative.  Negative for dizziness and headaches.  Psychiatric/Behavioral: Negative.       Today's Vitals   09/02/22 0933 09/02/22 1006  BP: 130/86 (!) 140/86  Pulse: 84   Temp: 98.6 F (37 C)   SpO2: 98%   Weight: 169 lb (76.7 kg)   Height: 5\' 5"  (1.651 m)    Body mass index is 28.12 kg/m.  Wt Readings from Last 3 Encounters:  09/02/22 169 lb (76.7 kg)  08/18/22 172 lb 1.6 oz (78.1 kg)  07/29/22 170 lb 9.6 oz (77.4 kg)    Objective:  Physical Exam Vitals and nursing note reviewed.  Constitutional:      Appearance: Normal appearance.  HENT:     Head: Normocephalic and atraumatic.     Nose:     Comments: Masked     Mouth/Throat:     Comments: Masked  Eyes:     Extraocular Movements: Extraocular movements intact.  Cardiovascular:     Rate and Rhythm: Normal rate and regular rhythm.     Heart sounds: Normal heart sounds.  Pulmonary:     Effort: Pulmonary effort is normal.     Breath sounds: Normal breath sounds.  Skin:    General: Skin is warm.  Neurological:     General: No focal deficit present.     Mental  Status: She is alert.  Psychiatric:        Mood and Affect: Mood  normal.        Behavior: Behavior normal.      Assessment And Plan:     1. Type 2 diabetes mellitus with stage 2 chronic kidney disease, with long-term current use of insulin (HCC) Comments: Chronic, she will c/w Trulicity 1.5mg  weekly.  I will check labs as below. She is reminded to avoid NSAIDs, stay hydrated and keep bp/bs controlled. - Hemoglobin A1c  2. Hypertensive heart and renal disease with renal failure, stage 1 through stage 4 or unspecified chronic kidney disease, without heart failure Comments: Uncontrolled, she admits that she did not take her meds this morning. Additionally, she is experiencing some post-op pain. No med changes today.  3. Atherosclerosis of aorta (HCC) Comments: Chronic, LDL goal < 70.  She will c/w rosuvastatin 20mg  daily and ASA 81mg  daily.  4. Pure hypercholesterolemia Comments: Please see above.  5. Hepatitis C carrier ([place]) Comments: LFTs have been normal. I will recheck LFTs today.  6. Overweight with body mass index (BMI) of 28 to 28.9 in adult Comments: She is encouraged to gradually increase her daily activity level, aiming for at least 150 minutes of exercise/week.   Patient was given opportunity to ask questions. Patient verbalized understanding of the plan and was able to repeat key elements of the plan. All questions were answered to their satisfaction.   I, [name], MD, have reviewed all documentation for this visit. The documentation on 09/02/22 for the exam, diagnosis, procedures, and orders are all accurate and complete.   IF YOU HAVE BEEN REFERRED TO A SPECIALIST, IT MAY TAKE 1-2 WEEKS TO SCHEDULE/PROCESS THE REFERRAL. IF YOU HAVE NOT HEARD FROM US/SPECIALIST IN TWO WEEKS, PLEASE GIVE [place] A CALL AT [phone].   THE PATIENT IS ENCOURAGED TO PRACTICE SOCIAL DISTANCING DUE TO THE COVID-19 PANDEMIC.

## 2022-09-03 LAB — HEMOGLOBIN A1C
Est. average glucose Bld gHb Est-mCnc: 140 mg/dL
Hgb A1c MFr Bld: 6.5 % — ABNORMAL HIGH (ref 4.8–5.6)

## 2022-09-10 ENCOUNTER — Ambulatory Visit (INDEPENDENT_AMBULATORY_CARE_PROVIDER_SITE_OTHER): Payer: Medicare HMO | Admitting: Obstetrics and Gynecology

## 2022-09-10 ENCOUNTER — Encounter: Payer: Self-pay | Admitting: Obstetrics and Gynecology

## 2022-09-10 ENCOUNTER — Telehealth: Payer: Self-pay | Admitting: Obstetrics and Gynecology

## 2022-09-10 VITALS — BP 129/77 | HR 55

## 2022-09-10 DIAGNOSIS — R350 Frequency of micturition: Secondary | ICD-10-CM | POA: Diagnosis not present

## 2022-09-10 DIAGNOSIS — G8918 Other acute postprocedural pain: Secondary | ICD-10-CM

## 2022-09-10 LAB — POCT URINALYSIS DIPSTICK
Bilirubin, UA: NEGATIVE
Glucose, UA: POSITIVE — AB
Ketones, UA: NEGATIVE
Leukocytes, UA: NEGATIVE
Nitrite, UA: NEGATIVE
Protein, UA: POSITIVE — AB
Spec Grav, UA: 1.025 (ref 1.010–1.025)
Urobilinogen, UA: 0.2 E.U./dL
pH, UA: 6 (ref 5.0–8.0)

## 2022-09-10 MED ORDER — OXYCODONE HCL 5 MG PO TABS: 5.0000 mg | ORAL_TABLET | ORAL | 0 refills | Status: DC | PRN

## 2022-09-10 NOTE — Patient Instructions (Addendum)
Continue to catheterize. Come back tomorrow for packing removal.

## 2022-09-10 NOTE — Progress Notes (Signed)
[facility] Return Visit  SUBJECTIVE  History of Present Illness: [name] [name] is a 72 y.o. female seen in follow-up for Post op bleeding.     Patient underwent ANTERIOR AND POSTERIOR REPAIR, PERINEORRHAPHY WITH SACROSPINOUS FIXATION on 08/18/22.   Patient reports that she was trying to have a hard bowel movement and it caused her to strain and she started having bleeding. Patient reports she has not been taking the miralax. States she was taking metamucil instead.    Past Medical History: Patient  has a past medical history of CKD (chronic kidney disease), stage II, COPD (chronic obstructive pulmonary disease) ([place]), Coronary artery disease (2005), Diverticulosis of colon, Dyslipidemia, Full dentures, History of anemia, History of hepatitis C virus infection, Hypertension, OSA (obstructive sleep apnea), Osteoarthritis, PAD (peripheral artery disease) ([place]), Peripheral neuropathy, Prolapse of anterior vaginal wall, S/P primary angioplasty with coronary stent (05/2004), Type 2 diabetes mellitus treated with insulin ([place]), Uterovaginal prolapse, incomplete, and Wears glasses.   Past Surgical History: She  has a past surgical history that includes Carpal tunnel release (Right, 2005); Tonsillectomy; Appendectomy (2003); Breast excisional biopsy (Right); Coronary angioplasty with stent (05/24/2004); Cardiac catheterization (05/21/2004); Cardiac catheterization (04/20/2006); Colonoscopy with propofol (10/12/2019); Cataract extraction w/ intraocular lens implant (Bilateral, 2022); Anterior and posterior repair with sacrospinous fixation (N/A, 08/18/2022); and Cystoscopy (N/A, 08/18/2022).   Medications: She has a current medication list which includes the following prescription(s): oxycodone, acetaminophen, amlodipine, vitamin c, aspirin ec, ciclopirox, coq-10, dexcom g7 sensor, estradiol, farxiga, gabapentin, gvoke hypopen 2-pack, glucosamine-chondroitin, glucose blood, ibuprofen, insulin  lispro, bd pen needle nano 2nd gen, magnesium oxide, metoprolol tartrate, neuriva, nystatin, omeprazole, onetouch delica lancets 0000000, pregabalin, rosuvastatin, trazodone, trulicity, vitamin a, vitamin d (ergocalciferol), and vitamin e.   Allergies: Patient is allergic to fish allergy and latex.   Social History: Patient  reports that she quit smoking about 8 years ago. Her smoking use included cigarettes. She has a 15.00 pack-year smoking history. She has never used smokeless tobacco. She reports current alcohol use. She reports that she does not currently use drugs.      OBJECTIVE   POC urine: Positive for trace blood and protein.   Physical Exam: Vitals:   09/10/22 0838  BP: 129/77  Pulse: (!) 55   Gen: No apparent distress, A&O x 3.  Detailed Urogynecologic Evaluation:  Patient had some oozing blood from anterior suture line, but nothing obvious from a specific place. Patient has a small area of bleeding to the right of the cervix. Small amount of silver nitrate used to the right of the cervix to assist with bleeding. After silver nitrate patient still had small amounts of blood dripping out that were distressing patient. No obvious sign of sutures being torn. Vaginal packing with estrogen cream applied as far as patient would tolerate. She did not tolerate insertion well and was tearful.      ASSESSMENT AND PLAN    [name] [name] is a 72 y.o. with:  1. Urinary frequency   2. Post-operative pain    POC urine positive for protein and Trace blood and glucose. No noted white blood cells or nitrites.  Prescribed patient Oxycodone IR PRN every 4 hours. Will remove the vaginal packing tomorrow in office and reassess bleeding.   Patient to return tomorrow for vaginal packing removal and for reassessment of bleeding.

## 2022-09-10 NOTE — Telephone Encounter (Signed)
Pt called back at 6am to report that's she woke up and her pad was overflowing with blood. The blood is dark red and no clots are present. After she cleaned everything up, she has not noticed any more bleeding. I advised her that if her bleeding remains minimal, she can wait until the office opens at 8am and come in to be evaluated. If bleeding increases, then she should call back and proceed to the ER. She will plan to come to the office unless there are any changes.   Jaquita Folds, MD

## 2022-09-10 NOTE — Telephone Encounter (Signed)
Pt called answering service. She reports that she was straining to have a BM and after she went she noticed some bleeding. While on the phone she checked her pad and stated only drops were present (about 20 min after her BM). We discussed monitoring bleeding and for her to call with any increased blood. Also recommended she restart miralax to prevent straining with bowel movements.

## 2022-09-11 ENCOUNTER — Encounter: Payer: Medicare HMO | Admitting: Obstetrics and Gynecology

## 2022-09-11 ENCOUNTER — Encounter: Payer: Self-pay | Admitting: Obstetrics and Gynecology

## 2022-09-11 ENCOUNTER — Ambulatory Visit (INDEPENDENT_AMBULATORY_CARE_PROVIDER_SITE_OTHER): Payer: Medicare HMO | Admitting: Obstetrics and Gynecology

## 2022-09-11 VITALS — BP 132/87 | HR 74

## 2022-09-11 DIAGNOSIS — G8918 Other acute postprocedural pain: Secondary | ICD-10-CM

## 2022-09-11 NOTE — Progress Notes (Signed)
[facility] Return Visit  SUBJECTIVE  History of Present Illness: Ivan Benne is a 72 y.o. female seen in follow-up for vaginal bleeding. Plan at last visit was packing was inserted with estrogen cream to help stop the bleeding.  Patient was able to go home with packing in and get rest. She presents today stating she has had x2 bowel movements and has been taking the miralax and her metamucil and it helped with her pain some.      Past Medical History: Patient  has a past medical history of CKD (chronic kidney disease), stage II, COPD (chronic obstructive pulmonary disease) ([place]), Coronary artery disease (2005), Diverticulosis of colon, Dyslipidemia, Full dentures, History of anemia, History of hepatitis C virus infection, Hypertension, OSA (obstructive sleep apnea), Osteoarthritis, PAD (peripheral artery disease) ([place]), Peripheral neuropathy, Prolapse of anterior vaginal wall, S/P primary angioplasty with coronary stent (05/2004), Type 2 diabetes mellitus treated with insulin ([place]), Uterovaginal prolapse, incomplete, and Wears glasses.   Past Surgical History: She  has a past surgical history that includes Carpal tunnel release (Right, 2005); Tonsillectomy; Appendectomy (2003); Breast excisional biopsy (Right); Coronary angioplasty with stent (05/24/2004); Cardiac catheterization (05/21/2004); Cardiac catheterization (04/20/2006); Colonoscopy with propofol (10/12/2019); Cataract extraction w/ intraocular lens implant (Bilateral, 2022); Anterior and posterior repair with sacrospinous fixation (N/A, 08/18/2022); and Cystoscopy (N/A, 08/18/2022).   Medications: She has a current medication list which includes the following prescription(s): acetaminophen, amlodipine, vitamin c, aspirin ec, ciclopirox, coq-10, dexcom g7 sensor, estradiol, farxiga, gabapentin, gvoke hypopen 2-pack, glucosamine-chondroitin, glucose blood, ibuprofen, insulin lispro, bd pen needle nano 2nd gen, magnesium  oxide, metoprolol tartrate, neuriva, nystatin, omeprazole, onetouch delica lancets 0000000, oxycodone, pregabalin, rosuvastatin, trazodone, trulicity, vitamin a, vitamin d (ergocalciferol), and vitamin e.   Allergies: Patient is allergic to fish allergy and latex.   Social History: Patient  reports that she quit smoking about 8 years ago. Her smoking use included cigarettes. She has a 15.00 pack-year smoking history. She has never used smokeless tobacco. She reports current alcohol use. She reports that she does not currently use drugs.      OBJECTIVE     Physical Exam: Vitals:   09/11/22 0946  BP: 132/87  Pulse: 74   Gen: No apparent distress, A&O x 3.  Detailed Urogynecologic Evaluation:  After removal of packing, there is still oozing on the anterior vaginal wall around the suture line and there is still bleeding from the back right wall of the vagina near the cervical region that does not seem controlled with the silver nitrate that was applied yesterday. Dr. Wannetta Sender came to exam room to examine patient and applied Monsel's to the area of bleeding. Patient was allowed to sit for a while and was re-examined. Bleeding had significantly slowed and a small further amount was applied to the area.      ASSESSMENT AND PLAN    Ms. Strawderman is a 72 y.o. with:  1. Post-operative pain    Overall patient feels she is much improved today as her pain is better. Patient had x2 bowel movements and feels that also improved her pain. She was instructed that she may see a peanut butter colored discharge related to the Monsel's.  Encouraged her to call if she is soaking through a pad an hour or seeing heavy clots. Patient has pain medication at home to take if she needs it.   Patient to return for routine follow up on 09/29/22

## 2022-09-11 NOTE — Patient Instructions (Signed)
Please take the miralax daily to keep from becoming constipated.   You may still see some small amounts of blood but as long as you are not saturating a pad every hour or having large clots this will continue to heal.   Please try not to strain as much as you can and you can take the medication for pain as needed up to every 4 hours.   Please call if you need to be sooner than your appointment on the 8th.

## 2022-09-12 ENCOUNTER — Telehealth: Payer: Self-pay

## 2022-09-12 NOTE — Telephone Encounter (Signed)
Pt called office stating bleeding is present again- had a large gush when she started moving around more. Did not have much bleeding yesterday or last night. She has changed her pad once today. Bleeding has slowed up again since she changed her pad. Has noticed one small clot- a little string. Asked if she could come to the office to be examined and she states she does not want to come because she feels like she has been examined enough this week. She is trying not to be too active and will let us know if she is having to change pads frequently.   Jaquita Folds, MD

## 2022-09-18 ENCOUNTER — Telehealth: Payer: Self-pay

## 2022-09-18 NOTE — Progress Notes (Addendum)
Care Management [facility] Coordination Services Pharmacy Team  Reason for Encounter: Appointment Reminder  Contacted patient to confirm telephone appointment with Orlando Penner, PharmD on 09-23-2022 at 9:00. Unsuccessful outreach. Left voicemail for patient to return call.  Chart review: Recent office visits:  09-02-2022 Glendale Chard, MD. A1C= 6.5  [id number] Debbora Dus, [place]. BP check 122/86.  Recent consult visits:  09-11-2022 Berton Mount, NP (OBGYN). Visit for post op pain.  09-10-2022 Berton Mount, NP (OBGYN). Visit for urinary frequency. START Oxycodone 5 mg every 4 hours PRN. Abnormal UA.  09-01-2022 Elita Quick, CMA (urogyn). Visit for 3rd voiding trial after surgery   08-25-2022 Elita Quick, CMA (urogyn). Visit for voiding trial after surgery.   08-21-2022 Elita Quick, [place] (urogyn). Visit for voiding trial after surgery.   08-18-2022 Jaquita Folds, MD (GYN). ANTERIOR AND POSTERIOR REPAIR, PERINEORRHAPHY WITH SACROSPINOUS FIXATION procedure.  07-30-2022 Pixie Casino, MD (Cardiology). Unable to view encounter.  07-29-2022 Berton Mount, NP (OBGYN). Pre op evaluation.  07-18-2022 Minus Breeding, MD (Cardiology). EKG completed. Stress test ordered.  07-02-2022 Marzetta Board, DPM (Podiatry). Toenails 1-5 b/l were debrided in length and girth with sterile nail nippers and dremel without iatrogenic bleeding. Start penlac Apply one coat to toenail qd.  Remove weekly with polish remover.   06-25-2022 Jaquita Folds, MD (OGYN). Visit for urinary retention. US renal ordered.  05-28-2022 Berton Mount, NP (OBGYN). Visit for uterovaginal prolapse. Abnormal UA.  Hospital visits:  None in previous 6 months  Star Rating Drugs:  Trulicity- Last filled [id number] 90 DS [facility] ([facility]ill). Previous 11-10-2021 90 DS Farxiga 10 mg- Last filled 09-01-2022 [facility]. Previous 07-26-2022 30  DS. Rosuvastatin 20 mg- Last filled 07-20-2022 90 DS [facility]. Previous 04-08-2022 90 DS  Care Gaps: Annual wellness visit in last year? Yes Covid booster overdue  If Diabetic: Last eye exam / retinopathy screening: 04-23-2022 Last diabetic foot exam: 07-02-2022   [place] Pharmacist Assistant [phone]

## 2022-09-23 ENCOUNTER — Telehealth: Payer: Self-pay

## 2022-09-23 NOTE — Progress Notes (Unsigned)
09-23-2022: Left patient a VM to reschedule today's appointment due to provider being out. Patient called back to reschedule.  Care Management & Coordination Services Pharmacy Team  Reason for Encounter: Appointment Reminder  Contacted patient to confirm telephone appointment with Orlando Penner, PharmD on 09-26-2022 at 10:00. Spoke with patient on 09/24/2022   Do you have any problems getting your medications? No Patient has 2 weeks worth of trulicity and [facility]. [facility] [address] and [facility] on [address] and [facility] on [facility] order. [facility]. [facility] and pharmacist Lennette Bihari will [facility] for transfer.  What is your top health concern you would like to discuss at your upcoming visit? Patient stated energy level has been low since after surgery and wants to know if supplements are needed.  Have you seen any other providers since your last visit with PCP? Yes   Chart review: Recent office visits:  09-02-2022 Glendale Chard, MD. A1C= 6.5   05-20-2022 Debbora Dus, [place]. BP check 122/86.   Recent consult visits:  09-11-2022 Berton Mount, NP (OBGYN). Visit for post op pain.   09-10-2022 Berton Mount, NP (OBGYN). Visit for urinary frequency. START Oxycodone 5 mg every 4 hours PRN. Abnormal UA.   09-01-2022 Elita Quick, CMA (urogyn). Visit for 3rd voiding trial after surgery    08-25-2022 Elita Quick, CMA (urogyn). Visit for voiding trial after surgery.    08-21-2022 Elita Quick, [place] (urogyn). Visit for voiding trial after surgery.    08-18-2022 Jaquita Folds, MD (GYN). ANTERIOR AND POSTERIOR REPAIR, PERINEORRHAPHY WITH SACROSPINOUS FIXATION procedure.   07-30-2022 Pixie Casino, MD (Cardiology). Unable to view encounter.   07-29-2022 Berton Mount, NP (OBGYN). Pre op evaluation.   07-18-2022 Minus Breeding, MD (Cardiology). EKG completed. Stress  test ordered.   07-02-2022 Marzetta Board, DPM (Podiatry). Toenails 1-5 b/l were debrided in length and girth with sterile nail nippers and dremel without iatrogenic bleeding. Start penlac Apply one coat to toenail qd.  Remove weekly with polish remover.    06-25-2022 Jaquita Folds, MD (OGYN). Visit for urinary retention. US renal ordered.   05-28-2022 Berton Mount, NP (OBGYN). Visit for uterovaginal prolapse. Abnormal UA.   Hospital visits:  None in previous 6 months   Star Rating Drugs:  Trulicity- Last filled Q000111Q [facility] ([facility] Previous 11-10-2021 90 DS Farxiga 10 mg- Last filled 09-01-2022 30 DS [facility]. Previous 07-26-2022 30 DS. Rosuvastatin 20 mg- Last filled 07-20-2022 90 DS [facility]. Previous 04-08-2022 90 DS   Care Gaps: Annual wellness visit in last year? Yes Covid booster overdue   If Diabetic: Last eye exam / retinopathy screening: 04-23-2022 Last diabetic foot exam: 07-02-2022  [place] Pharmacist Assistant [phone]

## 2022-09-25 ENCOUNTER — Other Ambulatory Visit: Payer: Self-pay | Admitting: Cardiology

## 2022-09-25 DIAGNOSIS — E1165 Type 2 diabetes mellitus with hyperglycemia: Secondary | ICD-10-CM | POA: Diagnosis not present

## 2022-09-25 DIAGNOSIS — Z794 Long term (current) use of insulin: Secondary | ICD-10-CM | POA: Diagnosis not present

## 2022-09-26 ENCOUNTER — Other Ambulatory Visit: Payer: Self-pay

## 2022-09-26 ENCOUNTER — Ambulatory Visit: Payer: Medicare HMO

## 2022-09-26 DIAGNOSIS — E1122 Type 2 diabetes mellitus with diabetic chronic kidney disease: Secondary | ICD-10-CM

## 2022-09-26 MED ORDER — DAPAGLIFLOZIN PROPANEDIOL 10 MG PO TABS: 10.0000 mg | ORAL_TABLET | Freq: Every day | ORAL | 3 refills | Status: AC

## 2022-09-26 MED ORDER — DEXCOM G7 SENSOR MISC: 3 refills | Status: DC

## 2022-09-26 NOTE — Progress Notes (Signed)
Care Management & Coordination Services Pharmacy Note  09/26/2022 Name:  [name] [name] MRN:  [id number] DOB:  [birth year]  Summary: Patient reports that she is very concerned because she was told by [facility] that her Marcelline Deist was stopped by the doctor. She is also wondering when we are going to send the GVOKE pen.   Recommendations/Changes made from today's visit: Recommend patient apply for patient assistance program through [place].   Follow up plan: Patient reports that she is open to applying for patient assistance  Patient is going to talk to the pharmacist about a coupon that was used for Vitamin D.  Collaborate with PCP to send in [place] and the GVOKE pen for the patient The Dexcom sensor to be sent by [employer]   Subjective: [name] [name] is an 72 y.o. year old female who is a primary patient of [name], MD.  The care coordination team was consulted for assistance with disease management and care coordination needs.    Engaged with patient by telephone for follow up visit.  Recent office visits:  09-02-2022 [name], MD. A1C= 6.5   [id number] [place], [place]. BP check 122/86.   Recent consult visits:  09-11-2022 Selmer Dominion, NP (OBGYN). Visit for post op pain.   09-10-2022 Selmer Dominion, NP (OBGYN). Visit for urinary frequency. START Oxycodone 5 mg every 4 hours PRN. Abnormal UA.   09-01-2022 Salina April, CMA (urogyn). Visit for 3rd voiding trial after surgery    08-25-2022 Salina April, CMA (urogyn). Visit for voiding trial after surgery.    08-21-2022 Salina April, CMA (urogyn). Visit for voiding trial after surgery.    08-18-2022 Marguerita Beards, MD (GYN). ANTERIOR AND POSTERIOR REPAIR, PERINEORRHAPHY WITH SACROSPINOUS FIXATION procedure.   07-30-2022 Chrystie Nose, MD (Cardiology). Unable to view encounter.   07-29-2022 Selmer Dominion, NP (OBGYN). Pre op evaluation.   07-18-2022 Rollene Rotunda, MD  (Cardiology). EKG completed. Stress test ordered.   07-02-2022 Freddie Breech, DPM (Podiatry). Toenails 1-5 b/l were debrided in length and girth with sterile nail nippers and dremel without iatrogenic bleeding. Start penlac Apply one coat to toenail qd.  Remove weekly with polish remover.    06-25-2022 Marguerita Beards, MD (OGYN). Visit for urinary retention. US renal ordered.   05-28-2022 Selmer Dominion, NP (OBGYN). Visit for uterovaginal prolapse. Abnormal UA.   Hospital visits:  None in previous 6 months   Objective:  Lab Results  Component Value Date   CREATININE 0.60 08/18/2022   BUN 11 08/18/2022   EGFR 74 04/30/2022   GFRNONAA >60 06/03/2021   GFRAA 87 07/04/2020   NA 144 08/18/2022   K 3.6 08/18/2022   CALCIUM 9.2 04/30/2022   CO2 25 04/30/2022   GLUCOSE 95 08/18/2022    Lab Results  Component Value Date/Time   HGBA1C 6.5 (H) 09/02/2022 10:22 AM   HGBA1C 7.1 (H) 04/30/2022 03:34 PM   MICROALBUR 30 04/10/2021 05:02 PM   MICROALBUR 30 03/21/2021 10:30 AM    Last diabetic Eye exam:  Lab Results  Component Value Date/Time   HMDIABEYEEXA No Retinopathy 04/23/2022 12:00 AM    Last diabetic Foot exam: No results found for: "HMDIABFOOTEX"   Lab Results  Component Value Date   CHOL 157 04/30/2022   HDL 59 04/30/2022   LDLCALC 75 04/30/2022   TRIG 135 04/30/2022   CHOLHDL 2.7 04/30/2022       Latest Ref Rng & Units 01/08/2022    4:49 PM 06/03/2021  12:33 PM 04/10/2021    3:37 PM  Hepatic Function  Total Protein 6.0 - 8.5 g/dL 7.4  7.9  7.2   Albumin 3.8 - 4.8 g/dL 4.5  4.4  4.3   AST 0 - 40 IU/L 18  23  18    ALT 0 - 32 IU/L 13  14  11    Alk Phosphatase 44 - 121 IU/L 172  111  135   Total Bilirubin 0.0 - 1.2 mg/dL <1.6<0.2  0.8  0.3     Lab Results  Component Value Date/Time   TSH 1.160 04/10/2021 03:37 PM   TSH 0.467 09/19/2014 04:00 PM   FREET4 1.30 05/04/2009 06:20 AM       Latest Ref Rng & Units 08/18/2022   11:39 AM 04/30/2022     3:34 PM 06/03/2021   12:33 PM  CBC  WBC 3.4 - 10.8 x10E3/uL  4.7  5.3   Hemoglobin 12.0 - 15.0 g/dL 10.915.6  60.412.5  54.012.6   Hematocrit 36.0 - 46.0 % 46.0  40.9  41.2   Platelets 150 - 450 x10E3/uL  265  284     Lab Results  Component Value Date/Time   VD25OH 32.5 03/23/2019 10:48 AM   VD25OH 28.8 03/17/2018 12:00 AM   VITAMINB12 1,049 01/08/2021 10:55 AM   VITAMINB12 1,134 05/07/2020 03:26 PM    Clinical ASCVD: Yes       09/02/2022    9:33 AM 04/30/2022    2:28 PM 01/08/2022    4:33 PM  Depression screen PHQ 2/9  Decreased Interest 0 0 0  Down, Depressed, Hopeless 0 0 0  PHQ - 2 Score 0 0 0     Social History   Tobacco Use  Smoking Status Former   Packs/day: 0.50   Years: 30.00   Additional pack years: 0.00   Total pack years: 15.00   Types: Cigarettes   Quit date: 12/20/2013   Years since quitting: 8.7  Smokeless Tobacco Never   BP Readings from Last 3 Encounters:  09/11/22 132/87  09/10/22 129/77  09/02/22 (!) 140/86   Pulse Readings from Last 3 Encounters:  09/11/22 74  09/10/22 (!) 55  09/02/22 84   Wt Readings from Last 3 Encounters:  09/02/22 169 lb (76.7 kg)  08/18/22 172 lb 1.6 oz (78.1 kg)  07/29/22 170 lb 9.6 oz (77.4 kg)   BMI Readings from Last 3 Encounters:  09/02/22 28.12 kg/m  08/18/22 28.64 kg/m  07/29/22 27.54 kg/m    Allergies  Allergen Reactions   Fish Allergy Hives    Cod   Latex Rash    Medications Reviewed Today     Reviewed by Selmer DominionZuleta, Kaitlin G, NP (Nurse Practitioner) on 09/11/22 at 1029  Med List Status: <None>   Medication Order Taking? Sig Documenting Provider Last Dose Status Informant  acetaminophen (TYLENOL) 500 MG tablet [id number] Yes Take 1 tablet (500 mg total) by mouth every 6 (six) hours as needed (pain). Selmer DominionZuleta, Kaitlin G, NP Taking Active   amLODipine (NORVASC) 10 MG tablet [id number] Yes TAKE 1 TABLET(10 MG) BY MOUTH DAILY Dorothyann PengSanders, Robyn, MD Taking Active   Ascorbic Acid (VITAMIN C) 500 MG CHEW [id number] Yes  Chew by mouth daily. [provider] Taking Active Self  aspirin EC 81 MG tablet [id number] Yes Take 81 mg by mouth daily. Swallow whole. [provider] Taking Active Self  ciclopirox (PENLAC) 8 % solution [id number] Yes Apply one coat to toenail qd.  Remove weekly with polish remover.  Patient taking  differently: Apply topically at bedtime. Apply one coat to toenail qd.  Remove weekly with polish remover.   Freddie Breech, DPM Taking Active Self           Med Note (SAVAGE, MICHELLE L   Mon Aug 18, 2022 10:53 AM) Not used yet.  Coenzyme Q10 (COQ-10) 100 MG CAPS [id number] Yes Take 1 tablet by mouth daily.  [provider] Taking Active Self  Continuous Blood Gluc Sensor (DEXCOM G7 SENSOR) MISC [id number] Yes Use to check blood sugar 3 times a day. Dx code e11.65 [name], MD Taking Active   estradiol (ESTRACE) 0.1 MG/GM vaginal cream [id number] Yes Place 0.5 g vaginally 2 (two) times a week. Place 0.5g nightly for two weeks then twice a week after Selmer Dominion, NP Taking Active Self  FARXIGA 10 MG TABS tablet [id number] Yes TAKE 1 TABLET(10 MG) BY MOUTH DAILY BEFORE BREAKFAST  Patient taking differently: Take 10 mg by mouth daily.   [name], MD Taking Active Self  gabapentin (NEURONTIN) 100 MG capsule [id number] Yes TAKE 1 CAPSULE(100 MG) BY MOUTH THREE TIMES DAILY Marguerita Beards, MD Taking Active   Glucagon (GVOKE HYPOPEN 2-PACK) 0.5 MG/0.1ML SOAJ [id number] Yes Use for blood sugars less than 60.  Patient taking differently: as directed. Use for blood sugars less than 60.   [name], MD Taking Active Self  glucosamine-chondroitin 500-400 MG tablet [id number] Yes Take 1 tablet by mouth daily. [provider] Taking Active Self  glucose blood (ONETOUCH VERIO) test strip [id number] Yes CHECK BLOOD SUGAR THREE TIMES DAILY dx: e11.65 [name], MD Taking Active   ibuprofen (ADVIL) 600 MG tablet [id number] Yes Take 1 tablet (600 mg  total) by mouth every 6 (six) hours as needed. Selmer Dominion, NP Taking Active            Med Note (SAVAGE, MICHELLE L   Mon Aug 18, 2022 10:50 AM) To start post op  insulin lispro (HUMALOG KWIKPEN) 100 UNIT/ML KwikPen [id number] Yes Inject subcutaneously 2 units if sugars are 150 - 199, 4 units 200 -249, 6 units 250- 299, 8 units 300 - 349, 10 units >350  Patient taking differently: Inject into the skin as needed. Inject subcutaneously 2 units if sugars are 150 - 199, 4 units 200 -249, 6 units 250- 299, 8 units 300 - 349, 10 units >350   [name], MD Taking Active Self  Insulin Pen Needle (BD PEN NEEDLE NANO 2ND GEN) 32G X 4 MM MISC [id number] Yes USE TO INJECT INSULIN DAILY [name], MD Taking Active   MAGNESIUM OXIDE PO [id number] Yes Take 400 mg by mouth at bedtime. [provider] Taking Active Self  metoprolol tartrate (LOPRESSOR) 50 MG tablet [id number] Yes TAKE 1 TABLET BY MOUTH TWICE DAILY  Patient taking differently: Take 50 mg by mouth 2 (two) times daily.   [name], MD Taking Active Self  Misc Natural Products [facility]) CAPS [id number] Yes Take 1 capsule by mouth daily. [provider] Taking Active Self  nystatin (NYSTATIN) powder [id number] Yes Apply topically 3 (three) times daily. As needed  Patient taking differently: Apply topically 3 (three) times daily as needed. As needed   [name], MD Taking Active Self  omeprazole (PRILOSEC) 20 MG capsule [id number] Yes TAKE 1 CAPSULE BY MOUTH DAILY [name], MD Taking Active   OneTouch Delica Lancets 33G [place] [id number] Yes USE AS DIRECTED TO CHECK BLOOD SUGAR THREE TIMES DAILY [name], MD Taking Active  oxyCODONE (OXY IR/ROXICODONE) 5 MG immediate release tablet [id number] Yes Take 1 tablet (5 mg total) by mouth every 4 (four) hours as needed for severe pain. Selmer DominionZuleta, Kaitlin G, NP Taking Active   pregabalin (LYRICA) 50 MG capsule [id number] Yes TAKE 1 CAPSULE(50 MG) BY MOUTH AT BEDTIME   Patient taking differently: Take 50 mg by mouth at bedtime. TAKE 1 CAPSULE(50 MG) BY MOUTH AT BEDTIME   Dorothyann PengSanders, Robyn, MD Taking Active Self  rosuvastatin (CRESTOR) 20 MG tablet [id number] Yes TAKE 1 TABLET(20 MG) BY MOUTH DAILY  Patient taking differently: Take 20 mg by mouth at bedtime.   Rollene RotundaHochrein, James, MD Taking Active Self  traZODone (DESYREL) 50 MG tablet [id number] Yes TAKE 1 TABLET BY MOUTH EVERY NIGHT AT BEDTIME  Patient taking differently: Take 50 mg by mouth at bedtime.   Dorothyann PengSanders, Robyn, MD Taking Active Self  TRULICITY 1.5 MG/0.5ML Namon CirriSOPN [id number] Yes ADMINISTER 1.5 MG UNDER THE SKIN 1 TIME A WEEK  Patient taking differently: Inject 1.5 mg into the skin once a week. Tuesday   Dorothyann PengSanders, Robyn, MD Taking Active Self  Vitamin A 3 MG (10000 UT) TABS [id number] Yes Take 1 tablet by mouth daily. 2,400 mcg [provider] Taking Active Self  Vitamin D, Ergocalciferol, (DRISDOL) 1.25 MG (50000 UNIT) CAPS capsule [id number] Yes TAKE 1 CAPSULE BY MOUTH EVERY 7 DAYS  Patient taking differently: Take 50,000 Units by mouth every 7 (seven) days. Friday's   Dorothyann PengSanders, Robyn, MD Taking Active Self  vitamin E 600 UNIT capsule [id number] Yes Take 400 Units by mouth at bedtime. [provider] Taking Active             SDOH:  (Social Determinants of Health) assessments and interventions performed: No SDOH Interventions    Flowsheet Row [facility] Most recent reading at 09/08/2022 10:37 PM [facility] Most recent reading at 04/30/2022  3:18 PM Clinical Support [facility] Most recent reading at 04/30/2022  2:26 PM [facility] Most recent reading at 01/08/2022  4:33 PM [facility] Most recent reading at 11/30/2021 12:27 PM [facility] Most recent reading at 08/22/2021  9:53 AM  SDOH Interventions        Food Insecurity Interventions -- -- Intervention Not Indicated -- -- --  Transportation Interventions -- -- Intervention Not Indicated -- -- --  Depression Interventions/Treatment  PHQ2-9 Score <4 Follow-up Not Indicated PHQ2-9 Score <4 Follow-up Not Indicated -- PHQ2-9 Score <4 Follow-up Not Indicated PHQ2-9 Score <4 Follow-up Not Indicated PHQ2-9 Score <4 Follow-up Not Indicated  Financial Strain Interventions -- -- Intervention Not Indicated -- -- --  Physical Activity Interventions -- -- Patient Refused, Other (Comments) -- -- --  Stress Interventions -- -- Intervention Not Indicated -- -- --       Medication Assistance: None required.  Patient affirms current coverage meets needs.  Medication Access: Within the past 30 days, how often has patient missed a dose of medication? She has not missed any doses Is a pillbox or other method used to improve adherence? Yes  Factors that may affect medication adherence? lack of understanding of disease management Are meds synced by current pharmacy? No  Are meds delivered by current pharmacy? No  Does patient experience delays in picking up medications due to transportation concerns? No   [facility] Services Reviewed: Is patient disadvantaged to use [facility]?: No  Current Rx insurance plan: [facility] Medicare  Name and location of Current pharmacy:  [id number] [place], [place] - [address] AT [facility] & [address] [place] [address] Phone: [phone] Fax: [phone]  [facility] - [place], [place] - [address] [facility] [address] [place] [address] Phone: [phone] Fax: [phone]  [facility] services reviewed with patient today?: No    Compliance/Adherence/Medication fill history: Care Gaps: Annual wellness visit in last year? Yes Covid booster overdue   Star-Rating Drugs: Trulicity- Last filled 06-07-2022 [facility] ([facility]). Previous 11-10-2021 90 DS Farxiga 10 mg- Last filled [facility]-01-2022 [facility]. Previous 07-26-2022 30 DS. Rosuvastatin 20 mg- Last filled 07-20-2022 90 DS [facility]. Previous 04-08-2022 90 DS   Assessment/Plan  Diabetes (A1c goal <7%) -Uncontrolled -Current medications: Humalog 100 unit/ml - inject as needed for a sliding scale Appropriate, Effective, Safe, Accessible Trulicity 1.5 mg/0.5 ml - administer 1.5 mg under the skin 1 time a week Appropriate, Effective, Safe, Accessible Farxiga 10 mg tablet once per day with breakfast Appropriate, Effective, Safe, Accessible  -Current home glucose readings fasting glucose: 120 - 100, she is using a CGM device but right now she is unable to check the trends because she cannot find the password  -Reports hypoglycemic/hyperglycemic symptoms  -patient has had to use her GVOKE one last year, and the last one was used last      Sunday -At the time her BS was 68, she reports that she heard her Dexcom G7 go off on her phone and that made her aware of the low sugar, she reports that she ran out of orange juice so she took the shot, she reports that she had a few pieces of chocolate but her BS continued to decline  -Current meal patterns:  breakfast: smoked sausage, hash browns   dinner: salad, strips of chicken, red, green and yellow peppers, with shredded lettuce   snacks: she did not have any sweets  drinks: smoothies with blueberries, pineapples, and protein powder, 2% lactose free milk  -Educated on Prevention and management of hypoglycemic episodes; -Discussed the importance of keep glucose tablets near her bedside  -Patient reports that she spoke with [facility] for a refill of her Dexcom sensor, she was told that she would  need to go through insurance and follow up with them , per patient she was told they needed a verification code but it must come from the provider through [facility]  -Recommended patient purchase hard candies, or glucose tablets    Cherylin Mylar, CPP, PharmD Clinical Pharmacist Practitioner [facility] [phone]

## 2022-09-29 ENCOUNTER — Encounter: Payer: Self-pay | Admitting: Obstetrics and Gynecology

## 2022-09-29 ENCOUNTER — Ambulatory Visit (INDEPENDENT_AMBULATORY_CARE_PROVIDER_SITE_OTHER): Payer: Medicare HMO | Admitting: Obstetrics and Gynecology

## 2022-09-29 VITALS — BP 128/84 | HR 78

## 2022-09-29 DIAGNOSIS — Z9889 Other specified postprocedural states: Secondary | ICD-10-CM

## 2022-09-29 NOTE — Progress Notes (Signed)
[facility]  Date of Visit: 09/29/2022  History of Present Illness: [name] [name] is a 72 y.o. female scheduled today for a post-operative visit.   Surgery: s/p Anterior and posterior repair with perineorrhaphy, sacrospinous hysteropexy, cystoscopy on 08/18/22  She did not pass her postoperative void trial. Returned on 08/21/22 and passed voiding trial at that time.   Postoperative course complicated by increased vaginal bleeding. Had a hard BM and started bleeding after that time. Bleeding was controlled in office with vaginal packing and monsels.   Today she reports she still has a little bit of brown discharge when she is more active. No SUI symptoms. Feels she is emptying her bladder well- has a much stronger urine stream. Has been using vaginal estrogen twice a week.   UTI in the last 6 weeks? No  Pain? No  She has returned to her normal activity (except for postop restrictions) Vaginal bulge? No  Stress incontinence: No  Urgency/frequency: No  Urge incontinence: Yes - occasionally if she waits too long Voiding dysfunction: No  Bowel issues: No   Subjective Success: Do you usually have a bulge or something falling out that you can see or feel in the vaginal area? No  Retreatment Success: Any retreatment with surgery or pessary for any compartment? No    Medications: She has a current medication list which includes the following prescription(s): acetaminophen, amlodipine, vitamin c, aspirin ec, ciclopirox, coq-10, dexcom g7 sensor, dapagliflozin propanediol, estradiol, gabapentin, gvoke hypopen 2-pack, glucosamine-chondroitin, glucose blood, ibuprofen, insulin lispro, bd pen needle nano 2nd gen, magnesium oxide, metoprolol tartrate, neuriva, nystatin, omeprazole, onetouch delica lancets 33g, oxycodone, pregabalin, rosuvastatin, trazodone, trulicity, vitamin a, vitamin d (ergocalciferol), and vitamin e.   Allergies: Patient is allergic to fish allergy and latex.   Physical  Exam: BP 128/84   Pulse 78    Pelvic Examination: Vagina: Incisions healing well. Sutures are present at the anterior incision line and there is not granulation tissue. No tenderness along the anterior or posterior vagina. No apical tenderness. No pelvic masses.   POP-Q: POP-Q  -3                                            Aa   -3                                           Ba  -7                                              C   2                                            Gh  2.5                                            Pb  9  tvl   -2.5                                            Ap  -2.5                                            Bp  -9                                              D   PVR (bladder scan)- 34ml  ---------------------------------------------------------  Assessment and Plan:  1. Post-operative state     - Overall well healed but will have her return in a month to ensure sutures have healed anteriorly.  - Can resume regular activity including exercise.  - Discussed avoidance of heavy lifting and straining long term to reduce the risk of recurrence.   All questions answered.   [name] Beards, MD

## 2022-10-10 ENCOUNTER — Ambulatory Visit: Payer: Medicare HMO | Admitting: Podiatry

## 2022-10-10 ENCOUNTER — Telehealth: Payer: Self-pay | Admitting: *Deleted

## 2022-10-13 NOTE — Telephone Encounter (Signed)
error 

## 2022-10-15 ENCOUNTER — Other Ambulatory Visit: Payer: Self-pay | Admitting: Cardiology

## 2022-10-24 NOTE — Progress Notes (Unsigned)
[facility] Return Visit  SUBJECTIVE  History of Present Illness: [name] [name] is a 72 y.o. female seen in follow-up post surgery.    Surgery: s/p Anterior and posterior repair with perineorrhaphy, sacrospinous hysteropexy, cystoscopy on 08/18/22  She did not pass her postoperative void trial. Returned on 08/21/22 and passed voiding trial at that time.   Postoperative course complicated by increased vaginal bleeding. Had a hard BM and started bleeding after that time. Bleeding was controlled in office with vaginal packing and monsels.    Patient is doing well and reports she is still sometimes feeling a small amount of pressure where the sutures were.   Past Medical History: Patient  has a past medical history of CKD (chronic kidney disease), stage II, COPD (chronic obstructive pulmonary disease) (HCC), Coronary artery disease (2005), Diverticulosis of colon, Dyslipidemia, Full dentures, History of anemia, History of hepatitis C virus infection, Hypertension, OSA (obstructive sleep apnea), Osteoarthritis, PAD (peripheral artery disease) (HCC), Peripheral neuropathy, Prolapse of anterior vaginal wall, S/P primary angioplasty with coronary stent (05/2004), Type 2 diabetes mellitus treated with insulin (HCC), Uterovaginal prolapse, incomplete, and Wears glasses.   Past Surgical History: She  has a past surgical history that includes Carpal tunnel release (Right, 2005); Tonsillectomy; Appendectomy (2003); Breast excisional biopsy (Right); Coronary angioplasty with stent (05/24/2004); Cardiac catheterization (05/21/2004); Cardiac catheterization (04/20/2006); Colonoscopy with propofol (10/12/2019); Cataract extraction w/ intraocular lens implant (Bilateral, 2022); Anterior and posterior repair with sacrospinous fixation (N/A, 08/18/2022); and Cystoscopy (N/A, 08/18/2022).   Medications: She has a current medication list which includes the following prescription(s): acetaminophen,  amlodipine, vitamin c, aspirin ec, ciclopirox, coq-10, dexcom g7 sensor, dapagliflozin propanediol, estradiol, gabapentin, glucosamine-chondroitin, glucose blood, ibuprofen, insulin lispro, bd pen needle nano 2nd gen, magnesium oxide, metoprolol tartrate, neuriva, nystatin, omeprazole, onetouch delica lancets 33g, pregabalin, rosuvastatin, trazodone, trulicity, vitamin a, vitamin d (ergocalciferol), and vitamin e.   Allergies: Patient is allergic to fish allergy and latex.   Social History: Patient  reports that she quit smoking about 8 years ago. Her smoking use included cigarettes. She has a 15.00 pack-year smoking history. She has never used smokeless tobacco. She reports current alcohol use. She reports that she does not currently use drugs.      OBJECTIVE     Physical Exam: Vitals:   10/27/22 0923  BP: 116/78  Pulse: 68   Gen: No apparent distress, A&O x 3.  Detailed Urogynecologic Evaluation:  POP-Q  -2.5                                            Aa   -2.5                                           Ba  -7                                              C   2.5  Gh  3                                            Pb  10                                            tvl   -2.5                                            Ap  -2.5                                            Bp  -9                                              D   Vaginal incisions have healed, there is still one suture on the right side anteriorly that is still dissolving but rest of sutures have healed. No bleeding present. No tenderness noted on exam. No granulation tissue.     ASSESSMENT AND PLAN    [name] [name] is a 72 y.o. with:  1. Post-operative state    Anterior suture line has healed well. She is doing well and overall appears stable. She is aware to avoid heavy lifting and straining to reduce her risk of reoccurrence and has been using stool softeners as  she needs to to prevent reoccurrence.    Patient to follow up PRN

## 2022-10-25 DIAGNOSIS — E1165 Type 2 diabetes mellitus with hyperglycemia: Secondary | ICD-10-CM | POA: Diagnosis not present

## 2022-10-27 ENCOUNTER — Encounter: Payer: Self-pay | Admitting: Obstetrics and Gynecology

## 2022-10-27 ENCOUNTER — Ambulatory Visit (INDEPENDENT_AMBULATORY_CARE_PROVIDER_SITE_OTHER): Payer: Medicare HMO | Admitting: Obstetrics and Gynecology

## 2022-10-27 VITALS — BP 116/78 | HR 68

## 2022-10-27 DIAGNOSIS — Z9889 Other specified postprocedural states: Secondary | ICD-10-CM

## 2022-10-27 NOTE — Patient Instructions (Addendum)
You can use vitamin E oil in the vagina x2 weekly.    Come back if you need anything or have concerns.

## 2022-11-04 ENCOUNTER — Ambulatory Visit (INDEPENDENT_AMBULATORY_CARE_PROVIDER_SITE_OTHER): Payer: Medicare HMO | Admitting: Podiatry

## 2022-11-04 DIAGNOSIS — B351 Tinea unguium: Secondary | ICD-10-CM | POA: Diagnosis not present

## 2022-11-04 DIAGNOSIS — M79674 Pain in right toe(s): Secondary | ICD-10-CM

## 2022-11-04 DIAGNOSIS — M79675 Pain in left toe(s): Secondary | ICD-10-CM

## 2022-11-04 DIAGNOSIS — Q828 Other specified congenital malformations of skin: Secondary | ICD-10-CM | POA: Diagnosis not present

## 2022-11-04 DIAGNOSIS — E119 Type 2 diabetes mellitus without complications: Secondary | ICD-10-CM

## 2022-11-04 NOTE — Progress Notes (Signed)
  Subjective:  Patient ID: [name] [name], female    DOB: [birth year],  MRN: [id number]  Rateel Pline presents to clinic today for preventative diabetic foot care and painful porokeratotic lesion(s) both feet and painful mycotic toenails that limit ambulation. Painful toenails interfere with ambulation. Aggravating factors include wearing enclosed shoe gear. Pain is relieved with periodic professional debridement. Painful porokeratotic lesions are aggravated when weightbearing with and without shoegear. Pain is relieved with periodic professional debridement.  Chief Complaint  Patient presents with   Nail Problem    DFC/ Callus left foot lateral side BS-160 A1C-7.1 PCP-[facility] PCP VST- a little over a month ago   New problem(s): None.   PCP is [name], MD.  Allergies  Allergen Reactions   Fish Allergy Hives    Cod   Latex Rash    Review of Systems: Negative except as noted in the HPI.  Objective: No changes noted in today's physical examination. There were no vitals filed for this visit. [name] is a pleasant 72 y.o. female WD, WN in NAD. AAO x 3.  Vascular Examination: Capillary refill time immediate b/l. Vascular status intact b/l with palpable pedal pulses. Pedal hair present b/l. No edema. No pain with calf compression b/l. Skin temperature gradient WNL b/l. No ischemia or gangrene noted b/l LE. No cyanosis or clubbing noted b/l LE.  Neurological Examination: Sensation grossly intact b/l with 10 gram monofilament. Vibratory sensation intact b/l. Protective sensation diminished with 10g monofilament b/l. Vibratory sensation intact b/l.  Dermatological Examination: Pedal skin with normal turgor, texture and tone b/l.   Toenails 1-5 b/l thick, discolored, elongated with subungual debris and pain on dorsal palpation. No open wounds b/l LE. No interdigital macerations noted b/l LE.   Porokeratotic lesion(s) plantar aspect of left foot x 2 and plantar aspect of  right foot x 2. No erythema, no edema, no drainage, no fluctuance.  Musculoskeletal Examination: Normal muscle strength 5/5 to all lower extremity muscle groups bilaterally. HAV with bunion bilaterally and hammertoes 2-5 b/l. Pes planus deformity noted bilateral LE.Marland Kitchen No pain, crepitus or joint limitation noted with ROM b/l LE.  Patient ambulates independently without assistive aids.  Assessment/Plan: 1. Pain due to onychomycosis of toenails of both feet   2. Porokeratosis   3. Diabetes mellitus without complication (HCC)    -Consent given for treatment as described below: -Examined patient. -Continue foot and shoe inspections daily. Monitor blood glucose per PCP/Endocrinologist's recommendations. -Patient to continue soft, supportive shoe gear daily. -Mycotic toenails 1-5 bilaterally were debrided in length and girth with sterile nail nippers and dremel without incident. -Porokeratotic lesion(s) plantar midfoot b/l pared and enucleated with sterile currette without incident. Total number of lesions debrided=2. -Patient/POA to call should there be question/concern in the interim.   Return in about 3 months (around 02/04/2023).  Freddie Breech, DPM

## 2022-11-07 ENCOUNTER — Other Ambulatory Visit: Payer: Self-pay

## 2022-11-07 ENCOUNTER — Encounter: Payer: Self-pay | Admitting: Podiatry

## 2022-11-07 MED ORDER — GVOKE HYPOPEN 2-PACK 0.5 MG/0.1ML ~~LOC~~ SOAJ: SUBCUTANEOUS | 3 refills | Status: DC

## 2022-11-14 ENCOUNTER — Other Ambulatory Visit: Payer: Self-pay | Admitting: Internal Medicine

## 2022-11-21 ENCOUNTER — Other Ambulatory Visit: Payer: Self-pay | Admitting: Internal Medicine

## 2022-11-25 ENCOUNTER — Other Ambulatory Visit: Payer: Self-pay | Admitting: Internal Medicine

## 2022-11-26 ENCOUNTER — Telehealth: Payer: Self-pay

## 2022-11-26 NOTE — Progress Notes (Cosign Needed Addendum)
Care Management & Coordination Services Pharmacy Team  Reason for Encounter: Appointment Reminder  Contacted patient to confirm telephone appointment with Cherylin Mylar, PharmD on 11-28-2022 at 9:00.  Appointment rescheduled by Cherylin Mylar to 12-03-2022 at 10:00  Unsuccessful outreach. Left voicemail for patient to return call.  Chart review: Recent office visits:  None  Recent consult visits:  11-04-2022 Freddie Breech, DPM (Podiatry). Visit for DM foot exam.  10-27-2022 Selmer Dominion, NP (OBGYN). Post op visit.  09-29-2022 Marguerita Beards, MD (OBGYN). Post op visit.  Hospital visits:  None in previous 6 months  Star Rating Drugs:  Trulicity- Last filed 09-24-2022 45 DS [facility]. Previous 06-06-2022 90 DS [facility] Farxiga 10 mg- Last filled 10-21-2022 30 DS [facility]. Previous 09-01-2022 30 DS. Rosuvastatin 20 mg- Last filled 10-13-2022 90 DS [facility]. Previous 07-20-2022 90 DS  Care Gaps: Annual wellness visit in last year? Yes Covid booster overdue  If Diabetic: Last eye exam / retinopathy screening: 04-23-2022  Last diabetic foot exam: 11-04-2022   Huey Romans [facility] Clinical Pharmacist Assistant [phone]

## 2022-12-02 ENCOUNTER — Encounter: Payer: Self-pay | Admitting: Internal Medicine

## 2022-12-02 ENCOUNTER — Ambulatory Visit (INDEPENDENT_AMBULATORY_CARE_PROVIDER_SITE_OTHER): Payer: Medicare HMO | Admitting: Internal Medicine

## 2022-12-02 VITALS — BP 124/82 | Temp 99.4°F | Ht 65.0 in

## 2022-12-02 DIAGNOSIS — J069 Acute upper respiratory infection, unspecified: Secondary | ICD-10-CM | POA: Diagnosis not present

## 2022-12-02 DIAGNOSIS — R6883 Chills (without fever): Secondary | ICD-10-CM

## 2022-12-02 DIAGNOSIS — I1310 Hypertensive heart and chronic kidney disease without heart failure, with stage 1 through stage 4 chronic kidney disease, or unspecified chronic kidney disease: Secondary | ICD-10-CM

## 2022-12-02 DIAGNOSIS — N182 Chronic kidney disease, stage 2 (mild): Secondary | ICD-10-CM | POA: Diagnosis not present

## 2022-12-02 DIAGNOSIS — R509 Fever, unspecified: Secondary | ICD-10-CM

## 2022-12-02 DIAGNOSIS — I700 Atherosclerosis of aorta: Secondary | ICD-10-CM | POA: Diagnosis not present

## 2022-12-02 DIAGNOSIS — R52 Pain, unspecified: Secondary | ICD-10-CM

## 2022-12-02 MED ORDER — AZITHROMYCIN 250 MG PO TABS: ORAL_TABLET | ORAL | 0 refills | Status: AC

## 2022-12-02 MED ORDER — CEFTRIAXONE SODIUM 500 MG IJ SOLR
500.0000 mg | Freq: Once | INTRAMUSCULAR | Status: AC
  Administered 2022-12-02: 500 mg via INTRAMUSCULAR

## 2022-12-02 NOTE — Progress Notes (Signed)
[name] [name] as a Neurosurgeon for [name] Aliment, MD.,have documented all relevant documentation on the behalf of [name] Aliment, MD,as directed by  [name] Aliment, MD while in the presence of [name] Aliment, MD.  Subjective:  Patient ID: [name] [name] , female    DOB: [birth year] , 72 y.o.   MRN: [id number]  Chief Complaint  Patient presents with   URI    HPI  Patient presents today experiencing cold symptoms.  She states her sx started last Friday. She initially had sore throat, runny nose, cough, and chest congestion. Denies fever. Cough is productive of yellowish green sputum, blood tinged on Saturday. She then started using Coricidin which has given her mild improvement in her symptoms. She also notes having some right-sided body aches. She denies known ill contacts.       Past Medical History:  Diagnosis Date   CKD (chronic kidney disease), stage II    COPD (chronic obstructive pulmonary disease) (HCC)    Coronary artery disease 2005   cardiologist--- dr Kaka Lions;    cardiac cath for acute angina 05-21-2004 multivessel disease;  cath after GI consult 12/ 2005  PCTA  w/ DES to prox RCA ;  cath 04-20-2006 patent stent with nonobstruction CAD;  NUC 12/ 2013 normal no ischemia;  last ETT 07-30-2022 no ischemia   Diverticulosis of colon    Dyslipidemia    Full dentures    History of anemia    History of hepatitis C virus infection    per pt was dx many yrs ago and treated in 2017  (noted in epic HCV RNA quan not detectable 10-25-2019)   Hypertension    OSA (obstructive sleep apnea)    study in epic 03-06-2019  moderate osa , (08-14-2022  pt stated she tried  cpap  but just decided to stop using it)   Osteoarthritis    PAD (peripheral artery disease) (HCC)    Peripheral neuropathy    Prolapse of anterior vaginal wall    S/P primary angioplasty with coronary stent 05/2004   PTCA and DES x1 to pRCA   Type 2 diabetes mellitus treated with insulin (HCC)    followed by pcp    (08-14-2022  per pt checks blood sugar multiple times dialy w/ dexcom 7,  fasting average 101)   Uterovaginal prolapse, incomplete    Wears glasses      Family History  Problem Relation Age of Onset   CAD Father 65   Heart attack Father    CAD Mother 15   Heart attack Mother    CAD Brother    Cancer Brother        prostate   CAD Sister    Cancer Sister        breast   CAD Brother      Current Outpatient Medications:    acetaminophen (TYLENOL) 500 MG tablet, Take 1 tablet (500 mg total) by mouth every 6 (six) hours as needed (pain)., Disp: 30 tablet, Rfl: 0   amLODipine (NORVASC) 10 MG tablet, TAKE 1 TABLET(10 MG) BY MOUTH DAILY, Disp: 90 tablet, Rfl: 1   Ascorbic Acid (VITAMIN C) 500 MG CHEW, Chew by mouth daily., Disp: , Rfl:    aspirin EC 81 MG tablet, Take 81 mg by mouth daily. Swallow whole., Disp: , Rfl:    ciclopirox (PENLAC) 8 % solution, Apply one coat to toenail qd.  Remove weekly with polish remover. (Patient taking differently: Apply topically at bedtime. Apply one coat  to toenail qd.  Remove weekly with polish remover.), Disp: 6.6 mL, Rfl: 11   Coenzyme Q10 (COQ-10) 100 MG CAPS, Take 1 tablet by mouth daily. , Disp: , Rfl:    Continuous Blood Gluc Sensor (DEXCOM G7 SENSOR) MISC, Use to check blood sugar 3 times a day. Dx code e11.65, Disp: 3 each, Rfl: 3   dapagliflozin propanediol (FARXIGA) 10 MG TABS tablet, Take 1 tablet (10 mg total) by mouth daily., Disp: 30 tablet, Rfl: 3   estradiol (ESTRACE) 0.1 MG/GM vaginal cream, Place 0.5 g vaginally 2 (two) times a week. Place 0.5g nightly for two weeks then twice a week after, Disp: 30 g, Rfl: 11   gabapentin (NEURONTIN) 100 MG capsule, TAKE 1 CAPSULE(100 MG) BY MOUTH THREE TIMES DAILY, Disp: 30 capsule, Rfl: 0   Glucagon (GVOKE HYPOPEN 2-PACK) 0.5 MG/0.1ML SOAJ, Use for blood sugars less than 60., Disp: 0.2 mL, Rfl: 3   glucosamine-chondroitin 500-400 MG tablet, Take 1 tablet by mouth daily., Disp: , Rfl:    glucose blood  (ONETOUCH VERIO) test strip, CHECK BLOOD SUGAR THREE TIMES DAILY dx: e11.65, Disp: 150 each, Rfl: 11   ibuprofen (ADVIL) 600 MG tablet, Take 1 tablet (600 mg total) by mouth every 6 (six) hours as needed., Disp: 30 tablet, Rfl: 0   insulin lispro (HUMALOG KWIKPEN) 100 UNIT/ML KwikPen, Inject subcutaneously 2 units if sugars are 150 - 199, 4 units 200 -249, 6 units 250- 299, 8 units 300 - 349, 10 units >350 (Patient taking differently: Inject into the skin as needed. Inject subcutaneously 2 units if sugars are 150 - 199, 4 units 200 -249, 6 units 250- 299, 8 units 300 - 349, 10 units >350), Disp: 15 mL, Rfl: 0   Insulin Pen Needle (BD PEN NEEDLE NANO 2ND GEN) 32G X 4 MM MISC, USE TO INJECT INSULIN DAILY, Disp: 100 each, Rfl: 2   MAGNESIUM OXIDE PO, Take 400 mg by mouth at bedtime., Disp: , Rfl:    Misc Natural Products (NEURIVA) CAPS, Take 1 capsule by mouth daily., Disp: , Rfl:    nystatin (NYSTATIN) powder, Apply topically 3 (three) times daily. As needed (Patient taking differently: Apply topically 3 (three) times daily as needed. As needed), Disp: 60 g, Rfl: 0   omeprazole (PRILOSEC) 20 MG capsule, TAKE 1 CAPSULE BY MOUTH DAILY, Disp: 90 capsule, Rfl: 1   OneTouch Delica Lancets 33G MISC, USE AS DIRECTED TO CHECK BLOOD SUGAR THREE TIMES DAILY, Disp: 100 each, Rfl: 0   rosuvastatin (CRESTOR) 20 MG tablet, TAKE 1 TABLET(20 MG) BY MOUTH DAILY, Disp: 90 tablet, Rfl: 2   traZODone (DESYREL) 50 MG tablet, TAKE 1 TABLET BY MOUTH EVERY NIGHT AT BEDTIME, Disp: 90 tablet, Rfl: 1   TRULICITY 1.5 MG/0.5ML SOPN, ADMINISTER 1.5 MG UNDER THE SKIN 1 TIME A WEEK (Patient taking differently: Inject 1.5 mg into the skin once a week. Tuesday), Disp: 6 mL, Rfl: 3   Vitamin A 3 MG (10000 UT) TABS, Take 1 tablet by mouth daily. 2,400 mcg, Disp: , Rfl:    Vitamin D, Ergocalciferol, (DRISDOL) 1.25 MG (50000 UNIT) CAPS capsule, TAKE 1 CAPSULE BY MOUTH EVERY 7 DAYS (Patient taking differently: Take 50,000 Units by mouth every  7 (seven) days. Friday's), Disp: 12 capsule, Rfl: 1   vitamin E 600 UNIT capsule, Take 400 Units by mouth at bedtime., Disp: , Rfl:    metoprolol tartrate (LOPRESSOR) 50 MG tablet, TAKE 1 TABLET BY MOUTH TWICE DAILY, Disp: 180 tablet, Rfl: 4  pregabalin (LYRICA) 50 MG capsule, Take 1 capsule (50 mg total) by mouth at bedtime. TAKE 1 CAPSULE(50 MG) BY MOUTH AT BEDTIME, Disp: 90 capsule, Rfl: 2   Allergies  Allergen Reactions   Fish Allergy Hives    Cod   Latex Rash     Review of Systems  Constitutional: Negative.   HENT:  Positive for congestion, sinus pressure, sneezing and sore throat.   Respiratory:  Positive for cough.   Cardiovascular: Negative.   Neurological: Negative.   Psychiatric/Behavioral: Negative.       Today's Vitals   12/02/22 1404  BP: 124/82  Temp: 99.4 F (37.4 C)  SpO2: 98%  Height: 5\' 5"  (1.651 m)   Body mass index is 28.12 kg/m.  Wt Readings from Last 3 Encounters:  09/02/22 169 lb (76.7 kg)  08/18/22 172 lb 1.6 oz (78.1 kg)  07/29/22 170 lb 9.6 oz (77.4 kg)    The 10-year ASCVD risk score (Arnett DK, et al., 2019) is: 40.5%   Values used to calculate the score:     Age: 30 years     Sex: Female     Is Non-Hispanic African American: Yes     Diabetic: Yes     Tobacco smoker: Yes     Systolic Blood Pressure: 124 mmHg     Is BP treated: Yes     HDL Cholesterol: 59 mg/dL     Total Cholesterol: 157 mg/dL  Objective:  Physical Exam Vitals and nursing note reviewed.  Constitutional:      Appearance: Normal appearance.  HENT:     Head: Normocephalic and atraumatic.     Right Ear: Tympanic membrane, ear canal and external ear normal. There is no impacted cerumen.     Mouth/Throat:     Pharynx: Posterior oropharyngeal erythema present.  Eyes:     Extraocular Movements: Extraocular movements intact.  Cardiovascular:     Rate and Rhythm: Normal rate and regular rhythm.     Heart sounds: Normal heart sounds.  Pulmonary:     Effort: Pulmonary  effort is normal.     Breath sounds: Normal breath sounds.  Musculoskeletal:     Cervical back: Normal range of motion.  Skin:    General: Skin is warm.  Neurological:     General: No focal deficit present.     Mental Status: She is alert.  Psychiatric:        Mood and Affect: Mood normal.        Behavior: Behavior normal.      Assessment And Plan:  1. URI with cough and congestion Comments: She was given Rocephin, 500mg  IM x 1 and rx Zpak, encouraged to take full course of abx. - cefTRIAXone (ROCEPHIN) injection 500 mg - azithromycin (ZITHROMAX) 250 MG tablet; Take 2 tablets (500 mg) on  Day 1,  followed by 1 tablet (250 mg) once daily on Days 2 through 5.  Dispense: 6 each; Refill: 0  2. Hypertensive heart and renal disease with renal failure, stage 1 through stage 4 or unspecified chronic kidney disease, without heart failure Comments: Chronic, fair control. She will c/w amlodipine 10mg  daily. Advised to follow low sodium diet.  3. Atherosclerosis of aorta (HCC) Comments: Chronic, LDL goal < 70.  She will c/w rosuvastatin 20mg  daily.  4. Chronic renal disease, stage II Comments: Chronic, reminded to avoid NSAIDs, stay well hydrated and keep BP well controlled to decrease risk okf CKD progression.    Return if symptoms worsen or fail to  improve.  Patient was given opportunity to ask questions. Patient verbalized understanding of the plan and was able to repeat key elements of the plan. All questions were answered to their satisfaction.    I, [name] Aliment, MD, have reviewed all documentation for this visit. The documentation on 12/02/22 for the exam, diagnosis, procedures, and orders are all accurate and complete.   IF YOU HAVE BEEN REFERRED TO A SPECIALIST, IT MAY TAKE 1-2 WEEKS TO SCHEDULE/PROCESS THE REFERRAL. IF YOU HAVE NOT HEARD FROM US/SPECIALIST IN TWO WEEKS, PLEASE GIVE [place] A CALL AT [phone].

## 2022-12-03 ENCOUNTER — Other Ambulatory Visit: Payer: Self-pay

## 2022-12-03 ENCOUNTER — Ambulatory Visit: Payer: Medicare HMO

## 2022-12-03 MED ORDER — PREGABALIN 50 MG PO CAPS: 50.0000 mg | ORAL_CAPSULE | Freq: Every day | ORAL | 2 refills | Status: DC

## 2022-12-03 NOTE — Progress Notes (Signed)
Care Management & Coordination Services Pharmacy Note  12/03/2022 Name:  [name] [name] MRN:  [id number] DOB:  1950/12/29  Summary: Recommend patient continue current medication regimen for now. She is going to pick up her Vitamin D from the pharmacy using the coupon provided and continue to follow sick day rules for her diabetes as she recovers.   Recommendations/Changes made from today's visit: Recommend [name] [name] continue current medication regimen and follow sick day guidelines for Diabetes  Follow up plan: [name] [name] is going to use the sick day protocol for diabetes for the next week until she feels well [name] [name] is going to rest and continue to drink at least 7 bottles of water per day for the next three months.  Moving forward [name] [name] will call [facility] at least 7 days before she needs a refill to make sure they have her medication    Subjective: [name] [name] is an 72 y.o. year old female who is a primary patient of [name], MD.  The care coordination team was consulted for assistance with disease management and care coordination needs.    Engaged with patient by telephone for follow up visit.  Recent office visits:  None   Recent consult visits:  11-04-2022 Freddie Breech, DPM (Podiatry). Visit for DM foot exam.   10-27-2022 Selmer Dominion, NP (OBGYN). Post op visit.   09-29-2022 Marguerita Beards, MD (OBGYN). Post op visit.   Hospital visits:  None in previous 6 months   Objective:  Lab Results  Component Value Date   CREATININE 0.60 08/18/2022   BUN 11 08/18/2022   EGFR 74 04/30/2022   GFRNONAA >60 06/03/2021   GFRAA 87 07/04/2020   NA 144 08/18/2022   K 3.6 08/18/2022   CALCIUM 9.2 04/30/2022   CO2 25 04/30/2022   GLUCOSE 95 08/18/2022    Lab Results  Component Value Date/Time   HGBA1C 6.5 (H) 09/02/2022 10:22 AM   HGBA1C 7.1 (H) 04/30/2022 03:34 PM   MICROALBUR 30 04/10/2021 05:02 PM   MICROALBUR 30 03/21/2021  10:30 AM    Last diabetic Eye exam:  Lab Results  Component Value Date/Time   HMDIABEYEEXA No Retinopathy 04/23/2022 12:00 AM    Last diabetic Foot exam: No results found for: "HMDIABFOOTEX"   Lab Results  Component Value Date   CHOL 157 04/30/2022   HDL 59 04/30/2022   LDLCALC 75 04/30/2022   TRIG 135 04/30/2022   CHOLHDL 2.7 04/30/2022       Latest Ref Rng & Units 01/08/2022    4:49 PM 06/03/2021   12:33 PM 04/10/2021    3:37 PM  Hepatic Function  Total Protein 6.0 - 8.5 g/dL 7.4  7.9  7.2   Albumin 3.8 - 4.8 g/dL 4.5  4.4  4.3   AST 0 - 40 IU/L 18  23  18    ALT 0 - 32 IU/L 13  14  11    Alk Phosphatase 44 - 121 IU/L 172  111  135   Total Bilirubin 0.0 - 1.2 mg/dL <1.1  0.8  0.3     Lab Results  Component Value Date/Time   TSH 1.160 04/10/2021 03:37 PM   TSH 0.467 09/19/2014 04:00 PM   FREET4 1.30 05/04/2009 06:20 AM       Latest Ref Rng & Units 08/18/2022   11:39 AM 04/30/2022    3:34 PM 06/03/2021   12:33 PM  CBC  WBC 3.4 - 10.8 x10E3/uL  4.7  5.3   Hemoglobin  12.0 - 15.0 g/dL 40.9  81.1  91.4   Hematocrit 36.0 - 46.0 % 46.0  40.9  41.2   Platelets 150 - 450 x10E3/uL  265  284     Lab Results  Component Value Date/Time   VD25OH 32.5 03/23/2019 10:48 AM   VD25OH 28.8 03/17/2018 12:00 AM   VITAMINB12 1,049 01/08/2021 10:55 AM   VITAMINB12 1,134 05/07/2020 03:26 PM    Clinical ASCVD: Yes  The 10-year ASCVD risk score (Arnett DK, et al., 2019) is: 40.5%   Values used to calculate the score:     Age: 45 years     Sex: Female     Is Non-Hispanic African American: Yes     Diabetic: Yes     Tobacco smoker: Yes     Systolic Blood Pressure: 124 mmHg     Is BP treated: Yes     HDL Cholesterol: 59 mg/dL     Total Cholesterol: 157 mg/dL        7/82/9562    1:30 AM 04/30/2022    2:28 PM 01/08/2022    4:33 PM  Depression screen PHQ 2/9  Decreased Interest 0 0 0  Down, Depressed, Hopeless 0 0 0  PHQ - 2 Score 0 0 0     Social History   Tobacco Use   Smoking Status Former   Packs/day: 0.50   Years: 30.00   Additional pack years: 0.00   Total pack years: 15.00   Types: Cigarettes   Quit date: 12/20/2013   Years since quitting: 8.9  Smokeless Tobacco Never   BP Readings from Last 3 Encounters:  12/02/22 124/82  10/27/22 116/78  09/29/22 128/84   Pulse Readings from Last 3 Encounters:  10/27/22 68  09/29/22 78  09/11/22 74   Wt Readings from Last 3 Encounters:  09/02/22 169 lb (76.7 kg)  08/18/22 172 lb 1.6 oz (78.1 kg)  07/29/22 170 lb 9.6 oz (77.4 kg)   BMI Readings from Last 3 Encounters:  12/02/22 28.12 kg/m  09/02/22 28.12 kg/m  08/18/22 28.64 kg/m    Allergies  Allergen Reactions   Fish Allergy Hives    Cod   Latex Rash    Medications Reviewed Today     Reviewed by [name], MD (Physician) on 12/02/22 at 1433  Med List Status: <None>   Medication Order Taking? Sig Documenting Provider Last Dose Status Informant  acetaminophen (TYLENOL) 500 MG tablet [id number] Yes Take 1 tablet (500 mg total) by mouth every 6 (six) hours as needed (pain). Selmer Dominion, NP Taking Active   amLODipine (NORVASC) 10 MG tablet [id number] Yes TAKE 1 TABLET(10 MG) BY MOUTH DAILY [name], MD Taking Active   Ascorbic Acid (VITAMIN C) 500 MG CHEW [id number] Yes Chew by mouth daily. [provider] Taking Active Self  aspirin EC 81 MG tablet [id number] Yes Take 81 mg by mouth daily. Swallow whole. [provider] Taking Active Self  ciclopirox (PENLAC) 8 % solution [id number] Yes Apply one coat to toenail qd.  Remove weekly with polish remover.  Patient taking differently: Apply topically at bedtime. Apply one coat to toenail qd.  Remove weekly with polish remover.   Freddie Breech, DPM Taking Active Self           Med Note (SAVAGE, MICHELLE L   Mon Aug 18, 2022 10:53 AM) Not used yet.  Coenzyme Q10 (COQ-10) 100 MG CAPS [id number] Yes Take 1 tablet by mouth daily.  [provider] Taking  Active Self  Continuous Blood Gluc Sensor (DEXCOM G7 SENSOR) MISC [id number] Yes Use to check blood sugar 3 times a day. Dx code e11.65 [name], MD Taking Active   dapagliflozin propanediol (FARXIGA) 10 MG TABS tablet [id number] Yes Take 1 tablet (10 mg total) by mouth daily. [name], MD Taking Active   estradiol (ESTRACE) 0.1 MG/GM vaginal cream [id number] Yes Place 0.5 g vaginally 2 (two) times a week. Place 0.5g nightly for two weeks then twice a week after Selmer Dominion, NP Taking Active Self  gabapentin (NEURONTIN) 100 MG capsule [id number] Yes TAKE 1 CAPSULE(100 MG) BY MOUTH THREE TIMES DAILY Marguerita Beards, MD Taking Active   Glucagon (GVOKE HYPOPEN 2-PACK) 0.5 MG/0.1ML SOAJ [id number] Yes Use for blood sugars less than 60. Arnette Felts, FNP Taking Active   glucosamine-chondroitin 500-400 MG tablet [id number] Yes Take 1 tablet by mouth daily. [provider] Taking Active Self  glucose blood (ONETOUCH VERIO) test strip [id number] Yes CHECK BLOOD SUGAR THREE TIMES DAILY dx: e11.65 [name], MD Taking Active   ibuprofen (ADVIL) 600 MG tablet [id number] Yes Take 1 tablet (600 mg total) by mouth every 6 (six) hours as needed. Selmer Dominion, NP Taking Active            Med Note (SAVAGE, MICHELLE L   Mon Aug 18, 2022 10:50 AM) To start post op  insulin lispro (HUMALOG KWIKPEN) 100 UNIT/ML KwikPen [id number] Yes Inject subcutaneously 2 units if sugars are 150 - 199, 4 units 200 -249, 6 units 250- 299, 8 units 300 - 349, 10 units >350  Patient taking differently: Inject into the skin as needed. Inject subcutaneously 2 units if sugars are 150 - 199, 4 units 200 -249, 6 units 250- 299, 8 units 300 - 349, 10 units >350   [name], MD Taking Active Self  Insulin Pen Needle (BD PEN NEEDLE NANO 2ND GEN) 32G X 4 MM MISC [id number] Yes USE TO INJECT INSULIN DAILY [name], MD Taking Active   MAGNESIUM OXIDE PO [id number] Yes Take 400 mg by mouth at bedtime.  [provider] Taking Active Self  metoprolol tartrate (LOPRESSOR) 50 MG tablet [id number]  TAKE 1 TABLET BY MOUTH TWICE DAILY [name], MD  Active   Misc Natural Products (NEURIVA) CAPS [id number] Yes Take 1 capsule by mouth daily. [provider] Taking Active Self  nystatin (NYSTATIN) powder [id number] Yes Apply topically 3 (three) times daily. As needed  Patient taking differently: Apply topically 3 (three) times daily as needed. As needed   [name], MD Taking Active Self  omeprazole (PRILOSEC) 20 MG capsule [id number] Yes TAKE 1 CAPSULE BY MOUTH DAILY [name], MD Taking Active   OneTouch Delica Lancets 33G [place] [id number] Yes USE AS DIRECTED TO CHECK BLOOD SUGAR THREE TIMES DAILY [name], MD Taking Active   pregabalin (LYRICA) 50 MG capsule [id number] Yes Take 1 capsule (50 mg total) by mouth at bedtime. TAKE 1 CAPSULE(50 MG) BY MOUTH AT BEDTIME [name], MD Taking Active   rosuvastatin (CRESTOR) 20 MG tablet [id number] Yes TAKE 1 TABLET(20 MG) BY MOUTH DAILY Rollene Rotunda, MD Taking Active   traZODone (DESYREL) 50 MG tablet [id number] Yes TAKE 1 TABLET BY MOUTH EVERY NIGHT AT BEDTIME [name], MD Taking Active   TRULICITY 1.5 MG/0.5ML Namon Cirri [id number] Yes ADMINISTER 1.5 MG UNDER THE SKIN 1 TIME A WEEK  Patient taking differently: Inject 1.5 mg into the skin once a week. Tuesday   [name], MD Taking  Active Self  Vitamin A 3 MG (10000 UT) TABS [id number] Yes Take 1 tablet by mouth daily. 2,400 mcg [provider] Taking Active Self  Vitamin D, Ergocalciferol, (DRISDOL) 1.25 MG (50000 UNIT) CAPS capsule [id number] Yes TAKE 1 CAPSULE BY MOUTH EVERY 7 DAYS  Patient taking differently: Take 50,000 Units by mouth every 7 (seven) days. Friday's   [name], MD Taking Active Self  vitamin E 600 UNIT capsule [id number] Yes Take 400 Units by mouth at bedtime. [provider] Taking Active             SDOH:  (Social  Determinants of Health) assessments and interventions performed: No SDOH Interventions    Flowsheet Row Office Visit from 12/02/2022 in [facility] Most recent reading at 12/02/2022  2:27 PM Office Visit from 09/02/2022 in [facility] Most recent reading at 09/08/2022 10:37 PM Office Visit from 04/30/2022 in [facility] Most recent reading at 04/30/2022  3:18 PM Clinical Support from 04/30/2022 in [facility] Most recent reading at 04/30/2022  2:26 PM Office Visit from 01/08/2022 in [facility] Most recent reading at 01/08/2022  4:33 PM Office Visit from 11/19/2021 in [facility] Most recent reading at 11/30/2021 12:27 PM  SDOH Interventions        Food Insecurity Interventions -- -- -- Intervention Not Indicated -- --  Transportation Interventions -- -- -- Intervention Not Indicated -- --  Depression Interventions/Treatment  PHQ2-9 Score <4 Follow-up Not Indicated PHQ2-9 Score <4 Follow-up Not Indicated PHQ2-9 Score <4 Follow-up Not Indicated -- PHQ2-9 Score <4 Follow-up Not Indicated PHQ2-9 Score <4 Follow-up Not Indicated  Financial Strain Interventions -- -- -- Intervention Not Indicated -- --  Physical Activity Interventions -- -- -- Patient Refused, Other (Comments) -- --  Stress Interventions -- -- -- Intervention Not Indicated -- --       Medication Assistance: None required.  Patient affirms current coverage meets needs.  Medication Access: Name and location of current pharmacy:  [id number] [place], [place] - [address] AT [facility] [address] [place] [address] Phone: [phone] Fax: [phone]  [facility] - [place], [place] - [address] [facility] [address] [place] [address] Phone:  [phone] Fax: [phone]  Within the past 30 days, how often has patient missed a dose of medication? No Is a pillbox or other method used to improve adherence? Yes  Factors that may affect medication adherence? financial need and lack of understanding of disease management Are meds synced by current pharmacy? No  Are meds delivered by current pharmacy? No  Does patient experience delays in picking up medications due to transportation concerns? No   Compliance/Adherence/Medication fill history: Care Gaps: Annual wellness visit in last year? Yes Covid booster overdue   If Diabetic: Last eye exam / retinopathy screening: 04-23-2022  Last diabetic foot exam: 11-04-2022  Star-Rating Drugs: Trulicity- Last filed 09-24-2022 90 DS [facility]. Previous 06-06-2022 90 DS [facility] Farxiga 10 mg- Last filled [facility]-21-2022 [facility]. Previous 09-01-2022 30 DS. Rosuvastatin 20 mg- Last filled 10-13-2022 90 DS [facility]. Previous 07-20-2022 90 DS   Assessment/Plan   Diabetes (A1c goal <7%) -Controlled -Current medications: Humalog Kwikpen - inject subq 2 units if sugars are 150-199 4 units 200-249, 6 units 250-299, 8 units 300-340, 10 units >350  Appropriate, Effective, Safe, Accessible Patient reports that she does not take this medication at all, although it is on her chart  Trulicity 1.5 mg/0.5 ml- administer 1.5 mg under the skin 1 time per week Appropriate, Effective, Safe, Accessible Farxiga 10 mg tablet once per day Appropriate, Effective, Safe, Accessible Patient reports that she picked up the [place] last week Saturday, she also reports that she picked on up in May -Current home glucose readings -Denies hypoglycemic/hyperglycemic symptoms -Current meal patterns:  breakfast: sausage egg and cheese croissant  dinner: macaroni salad with tuna, and crackers snacks: she had cheetos  drinks: plenty of water, and orange juice with apple juice mixed, she is drinking at least 7-8  bottles of water per day  -Educated on Sick day management: take 1/2 of usual oral medication dose until on usual diet, take 2/3 of usual insulin dose until back on usual diet, glucometer readings QID until well and stable, maintain hydration with high fluid intake, institute 10% insulin sliding scale (written instructions given), and call MD if glucose above 400 -Counseled to check feet daily and get yearly eye exams -Educated on diabetes sick day management  Health Maintenance -Vaccine gaps: COVID-19 Vaccine -Current therapy:  Vitamin D 50, 000 units- patient to com -Educated on  the importance of her Vitamin D and how to use a coupon card for this prescribed medication  - Patient voiced understanding -Collaborated with Ms. Armato to complete Vitamin D coupon card assistance  -Recommended to continue current medication   Cherylin Mylar, CPP, PharmD Clinical Pharmacist Practitioner [facility] [phone]

## 2022-12-05 ENCOUNTER — Other Ambulatory Visit: Payer: Self-pay | Admitting: Internal Medicine

## 2022-12-14 DIAGNOSIS — I700 Atherosclerosis of aorta: Secondary | ICD-10-CM | POA: Insufficient documentation

## 2022-12-14 DIAGNOSIS — N182 Chronic kidney disease, stage 2 (mild): Secondary | ICD-10-CM | POA: Insufficient documentation

## 2022-12-14 NOTE — Patient Instructions (Signed)
Cough, Adult Coughing is a reflex that clears your throat and airways (respiratory system). It helps heal and protect your lungs. It is normal to cough from time to time. A cough that happens with other symptoms or that lasts a long time may be a sign of a condition that needs treatment. A short-term (acute) cough may only last 2-3 weeks. A long-term (chronic) cough may last 8 or more weeks. Coughing is often caused by: Diseases, such as: An infection of the respiratory system. Asthma or other heart or lung diseases. Gastroesophageal reflux. This is when acid comes back up from the stomach. Breathing in things that irritate your lungs. Allergies. Postnasal drip. This is when mucus runs down the back of your throat. Smoking. Some medicines. Follow these instructions at home: Medicines Take over-the-counter and prescription medicines only as told by your health care provider. Talk with your provider before you take cough medicine (cough suppressants). Eating and drinking Do not drink alcohol. Avoid caffeine. Drink enough fluid to keep your pee (urine) pale yellow. Lifestyle Avoid cigarette smoke. Do not use any products that contain nicotine or tobacco. These products include cigarettes, chewing tobacco, and vaping devices, such as e-cigarettes. If you need help quitting, ask your provider. Avoid things that make you cough. These may include perfumes, candles, cleaning products, or campfire smoke. General instructions  Watch for any changes to your cough. Tell your provider about them. Always cover your mouth when you cough. If the air is dry in your bedroom or home, use a cool mist vaporizer or humidifier. If your cough is worse at night, try to sleep in a semi-upright position. Rest as needed. Contact a health care provider if: You have new symptoms, or your symptoms get worse. You cough up pus. You have a fever that does not go away or a cough that does not get better after 2-3  weeks. You cannot control your cough with medicine, and you are losing sleep. You have pain that gets worse or is not helped with medicine. You lose weight for no clear reason. You have night sweats. Get help right away if: You cough up blood. You have trouble breathing. Your heart is beating very fast. These symptoms may be an emergency. Get help right away. Call 911. Do not wait to see if the symptoms will go away. Do not drive yourself to the hospital. This information is not intended to replace advice given to you by your health care provider. Make sure you discuss any questions you have with your health care provider. Document Revised: 02/07/2022 Document Reviewed: 02/07/2022 Elsevier Patient Education  2024 Elsevier Inc.  

## 2022-12-15 ENCOUNTER — Other Ambulatory Visit: Payer: Self-pay | Admitting: Internal Medicine

## 2022-12-22 ENCOUNTER — Other Ambulatory Visit: Payer: Self-pay | Admitting: Internal Medicine

## 2022-12-22 MED ORDER — PREGABALIN 50 MG PO CAPS: 50.0000 mg | ORAL_CAPSULE | Freq: Every day | ORAL | 2 refills | Status: DC

## 2022-12-23 ENCOUNTER — Other Ambulatory Visit: Payer: Self-pay | Admitting: Internal Medicine

## 2022-12-24 ENCOUNTER — Ambulatory Visit: Payer: Medicare HMO | Admitting: Obstetrics and Gynecology

## 2022-12-24 NOTE — Progress Notes (Deleted)
[facility] Return Visit  SUBJECTIVE  History of Present Illness: [name] [name] is a 72 y.o. female seen in follow-up for vaginal pain after post op. Plan at last visit was ***.     Past Medical History: Patient  has a past medical history of CKD (chronic kidney disease), stage II, COPD (chronic obstructive pulmonary disease) (HCC), Coronary artery disease (2005), Diverticulosis of colon, Dyslipidemia, Full dentures, History of anemia, History of hepatitis C virus infection, Hypertension, OSA (obstructive sleep apnea), Osteoarthritis, PAD (peripheral artery disease) (HCC), Peripheral neuropathy, Prolapse of anterior vaginal wall, S/P primary angioplasty with coronary stent (05/2004), Type 2 diabetes mellitus treated with insulin (HCC), Uterovaginal prolapse, incomplete, and Wears glasses.   Past Surgical History: She  has a past surgical history that includes Carpal tunnel release (Right, 2005); Tonsillectomy; Appendectomy (2003); Breast excisional biopsy (Right); Coronary angioplasty with stent (05/24/2004); Cardiac catheterization (05/21/2004); Cardiac catheterization (04/20/2006); Colonoscopy with propofol (10/12/2019); Cataract extraction w/ intraocular lens implant (Bilateral, 2022); Anterior and posterior repair with sacrospinous fixation (N/A, 08/18/2022); and Cystoscopy (N/A, 08/18/2022).   Medications: She has a current medication list which includes the following prescription(s): acetaminophen, amlodipine, vitamin c, aspirin ec, ciclopirox, coq-10, dexcom g7 sensor, dapagliflozin propanediol, estradiol, gvoke hypopen 2-pack, glucosamine-chondroitin, glucose blood, ibuprofen, insulin lispro, bd pen needle nano 2nd gen, magnesium oxide, metoprolol tartrate, neuriva, nystatin, omeprazole, onetouch delica lancets 33g, pregabalin, rosuvastatin, trazodone, trulicity, vitamin a, vitamin d (ergocalciferol), and vitamin e.   Allergies: Patient is allergic to fish allergy and latex.    Social History: Patient  reports that she quit smoking about 9 years ago. Her smoking use included cigarettes. She has a 15.00 pack-year smoking history. She has never used smokeless tobacco. She reports current alcohol use. She reports that she does not currently use drugs.      OBJECTIVE     Physical Exam: There were no vitals filed for this visit. Gen: No apparent distress, A&O x 3.  Detailed Urogynecologic Evaluation:  Deferred. Prior exam showed:      No data to display             ASSESSMENT AND PLAN    [name] [name] is a 72 y.o. with:  No diagnosis found.

## 2022-12-30 ENCOUNTER — Encounter: Payer: Self-pay | Admitting: Obstetrics and Gynecology

## 2022-12-30 ENCOUNTER — Ambulatory Visit (INDEPENDENT_AMBULATORY_CARE_PROVIDER_SITE_OTHER): Payer: Medicare HMO | Admitting: Obstetrics and Gynecology

## 2022-12-30 VITALS — BP 112/73 | HR 62

## 2022-12-30 DIAGNOSIS — N898 Other specified noninflammatory disorders of vagina: Secondary | ICD-10-CM | POA: Diagnosis not present

## 2022-12-30 DIAGNOSIS — K429 Umbilical hernia without obstruction or gangrene: Secondary | ICD-10-CM | POA: Diagnosis not present

## 2022-12-30 NOTE — Progress Notes (Signed)
[facility] Return Visit  SUBJECTIVE  History of Present Illness: [name] [name] is a 72 y.o. female seen in follow-up for vaginal pain. She reports she has been having a feeling of "sticking" that is off and on in the vagina. She reports feeling it more on the right side. She reports she is still having some mild soreness on the right side during bathing as well.   She wanted the area checked to make sure there is no areas of concerns.     Past Medical History: Patient  has a past medical history of CKD (chronic kidney disease), stage II, COPD (chronic obstructive pulmonary disease) (HCC), Coronary artery disease (2005), Diverticulosis of colon, Dyslipidemia, Full dentures, History of anemia, History of hepatitis C virus infection, Hypertension, OSA (obstructive sleep apnea), Osteoarthritis, PAD (peripheral artery disease) (HCC), Peripheral neuropathy, Prolapse of anterior vaginal wall, S/P primary angioplasty with coronary stent (05/2004), Type 2 diabetes mellitus treated with insulin (HCC), Uterovaginal prolapse, incomplete, and Wears glasses.   Past Surgical History: She  has a past surgical history that includes Carpal tunnel release (Right, 2005); Tonsillectomy; Appendectomy (2003); Breast excisional biopsy (Right); Coronary angioplasty with stent (05/24/2004); Cardiac catheterization (05/21/2004); Cardiac catheterization (04/20/2006); Colonoscopy with propofol (10/12/2019); Cataract extraction w/ intraocular lens implant (Bilateral, 2022); Anterior and posterior repair with sacrospinous fixation (N/A, 08/18/2022); and Cystoscopy (N/A, 08/18/2022).   Medications: She has a current medication list which includes the following prescription(s): acetaminophen, amlodipine, vitamin c, aspirin ec, ciclopirox, coq-10, dexcom g7 sensor, dapagliflozin propanediol, estradiol, gvoke hypopen 2-pack, glucosamine-chondroitin, glucose blood, ibuprofen, insulin lispro, bd pen needle nano 2nd gen,  magnesium oxide, neuriva, nystatin, omeprazole, onetouch delica lancets 33g, pregabalin, rosuvastatin, trazodone, trulicity, vitamin a, vitamin d (ergocalciferol), vitamin e, and metoprolol tartrate.   Allergies: Patient is allergic to fish allergy and latex.   Social History: Patient  reports that she quit smoking about 9 years ago. Her smoking use included cigarettes. She has a 15.00 pack-year smoking history. She has never used smokeless tobacco. She reports current alcohol use. She reports that she does not currently use drugs.      OBJECTIVE     Physical Exam: Vitals:   12/30/22 0955  BP: 112/73  Pulse: 62   Gen: No apparent distress, A&O x 3.  Abd: Patient has a protruding umbilical hernia that is tender to touch. No referred pain noted on abdominal palpation. No pulsating felt at site.   Detailed Urogynecologic Evaluation:  Vaginal exam reveals well healed vaginal tissue. No signs of bleeding, mass, or other irritation. Small area of redened skin at the hymenal ring past the right vaginal opening. She did have a small suture tail at the apex that was loose and easily removed with speculum. We discussed that this could have been causing the sticking sensation she was feeling.    ASSESSMENT AND PLAN    Ms. Breitenstein is a 72 y.o. with:  1. Vaginal irritation   2. Vaginal dryness   3. Reducible umbilical hernia    Small area of irritation where a suture tail was in the vagina. It was no longer attached and easily taken out. Encouraged patient to do her vaginal estrogen cream x2 weekly to support vaginal tissue health.  Encouraged to use vagina estrogen.  Patient has an umbilical hernia that is causing her irritation. Will send a message to her PCP about this as she may need to see a general surgeon for consult if she would like this repaired.   Patient to return PRN.

## 2022-12-30 NOTE — Patient Instructions (Signed)
Use the estrogen cream twice a week for the vaginal tissue.

## 2023-01-08 ENCOUNTER — Ambulatory Visit (INDEPENDENT_AMBULATORY_CARE_PROVIDER_SITE_OTHER): Payer: Medicare HMO | Admitting: Internal Medicine

## 2023-01-08 ENCOUNTER — Ambulatory Visit: Payer: Medicare HMO | Admitting: Internal Medicine

## 2023-01-08 ENCOUNTER — Encounter: Payer: Self-pay | Admitting: Internal Medicine

## 2023-01-08 VITALS — BP 118/82 | HR 75 | Temp 98.4°F | Ht 65.0 in | Wt 166.6 lb

## 2023-01-08 DIAGNOSIS — E663 Overweight: Secondary | ICD-10-CM | POA: Diagnosis not present

## 2023-01-08 DIAGNOSIS — I1310 Hypertensive heart and chronic kidney disease without heart failure, with stage 1 through stage 4 chronic kidney disease, or unspecified chronic kidney disease: Secondary | ICD-10-CM | POA: Diagnosis not present

## 2023-01-08 DIAGNOSIS — N182 Chronic kidney disease, stage 2 (mild): Secondary | ICD-10-CM | POA: Diagnosis not present

## 2023-01-08 DIAGNOSIS — Z6827 Body mass index (BMI) 27.0-27.9, adult: Secondary | ICD-10-CM | POA: Diagnosis not present

## 2023-01-08 DIAGNOSIS — Z794 Long term (current) use of insulin: Secondary | ICD-10-CM

## 2023-01-08 DIAGNOSIS — E7800 Pure hypercholesterolemia, unspecified: Secondary | ICD-10-CM | POA: Diagnosis not present

## 2023-01-08 DIAGNOSIS — E1122 Type 2 diabetes mellitus with diabetic chronic kidney disease: Secondary | ICD-10-CM

## 2023-01-08 DIAGNOSIS — K439 Ventral hernia without obstruction or gangrene: Secondary | ICD-10-CM | POA: Insufficient documentation

## 2023-01-08 DIAGNOSIS — Z6829 Body mass index (BMI) 29.0-29.9, adult: Secondary | ICD-10-CM | POA: Insufficient documentation

## 2023-01-08 DIAGNOSIS — I700 Atherosclerosis of aorta: Secondary | ICD-10-CM | POA: Diagnosis not present

## 2023-01-08 NOTE — Patient Instructions (Signed)
Hypertension, Adult Hypertension is another name for high blood pressure. High blood pressure forces your heart to work harder to pump blood. This can cause problems over time. There are two numbers in a blood pressure reading. There is a top number (systolic) over a bottom number (diastolic). It is best to have a blood pressure that is below 120/80. What are the causes? The cause of this condition is not known. Some other conditions can lead to high blood pressure. What increases the risk? Some lifestyle factors can make you more likely to develop high blood pressure: Smoking. Not getting enough exercise or physical activity. Being overweight. Having too much fat, sugar, calories, or salt (sodium) in your diet. Drinking too much alcohol. Other risk factors include: Having any of these conditions: Heart disease. Diabetes. High cholesterol. Kidney disease. Obstructive sleep apnea. Having a family history of high blood pressure and high cholesterol. Age. The risk increases with age. Stress. What are the signs or symptoms? High blood pressure may not cause symptoms. Very high blood pressure (hypertensive crisis) may cause: Headache. Fast or uneven heartbeats (palpitations). Shortness of breath. Nosebleed. Vomiting or feeling like you may vomit (nauseous). Changes in how you see. Very bad chest pain. Feeling dizzy. Seizures. How is this treated? This condition is treated by making healthy lifestyle changes, such as: Eating healthy foods. Exercising more. Drinking less alcohol. Your doctor may prescribe medicine if lifestyle changes do not help enough and if: Your top number is above 130. Your bottom number is above 80. Your personal target blood pressure may vary. Follow these instructions at home: Eating and drinking  If told, follow the DASH eating plan. To follow this plan: Fill one half of your plate at each meal with fruits and vegetables. Fill one fourth of your plate  at each meal with whole grains. Whole grains include whole-wheat pasta, brown rice, and whole-grain bread. Eat or drink low-fat dairy products, such as skim milk or low-fat yogurt. Fill one fourth of your plate at each meal with low-fat (lean) proteins. Low-fat proteins include fish, chicken without skin, eggs, beans, and tofu. Avoid fatty meat, cured and processed meat, or chicken with skin. Avoid pre-made or processed food. Limit the amount of salt in your diet to less than 1,500 mg each day. Do not drink alcohol if: Your doctor tells you not to drink. You are pregnant, may be pregnant, or are planning to become pregnant. If you drink alcohol: Limit how much you have to: 0-1 drink a day for women. 0-2 drinks a day for men. Know how much alcohol is in your drink. In the [place] one drink equals one 12 oz bottle of beer (355 mL), one 5 oz glass of wine (148 mL), or one 1 oz glass of hard liquor (44 mL). Lifestyle  Work with your doctor to stay at a healthy weight or to lose weight. Ask your doctor what the best weight is for you. Get at least 30 minutes of exercise that causes your heart to beat faster (aerobic exercise) most days of the week. This may include walking, swimming, or biking. Get at least 30 minutes of exercise that strengthens your muscles (resistance exercise) at least 3 days a week. This may include lifting weights or doing Pilates. Do not smoke or use any products that contain nicotine or tobacco. If you need help quitting, ask your doctor. Check your blood pressure at home as told by your doctor. Keep all follow-up visits. Medicines Take over-the-counter and prescription medicines   only as told by your doctor. Follow directions carefully. Do not skip doses of blood pressure medicine. The medicine does not work as well if you skip doses. Skipping doses also puts you at risk for problems. Ask your doctor about side effects or reactions to medicines that you should watch  for. Contact a doctor if: You think you are having a reaction to the medicine you are taking. You have headaches that keep coming back. You feel dizzy. You have swelling in your ankles. You have trouble with your vision. Get help right away if: You get a very bad headache. You start to feel mixed up (confused). You feel weak or numb. You feel faint. You have very bad pain in your: Chest. Belly (abdomen). You vomit more than once. You have trouble breathing. These symptoms may be an emergency. Get help right away. Call 911. Do not wait to see if the symptoms will go away. Do not drive yourself to the hospital. Summary Hypertension is another name for high blood pressure. High blood pressure forces your heart to work harder to pump blood. For most people, a normal blood pressure is less than 120/80. Making healthy choices can help lower blood pressure. If your blood pressure does not get lower with healthy choices, you may need to take medicine. This information is not intended to replace advice given to you by your health care provider. Make sure you discuss any questions you have with your health care provider. Document Revised: 03/28/2021 Document Reviewed: 03/28/2021 Elsevier Patient Education  2024 Elsevier Inc.  

## 2023-01-08 NOTE — Progress Notes (Unsigned)
I,Jameka J Llittleton, CMA,acting as a Neurosurgeon for [name], MD.,have documented all relevant documentation on the behalf of [name], MD,as directed by  [name], MD while in the presence of [name], MD.  Subjective:  Patient ID: [name] [name] , female    DOB: February 28, 1951 , 72 y.o.   MRN: [id number]  Chief Complaint  Patient presents with   Hypertension   Diabetes   Hyperlipidemia    HPI  Patient presents today for a diabetes, HTN & cholesterol check. Patient reports compliance with her meds. She denies headaches, chest pain and shortness of breath.  She reports having a visit on 07/09 with Dr. Cordelia Pen. She discussed with specialist Umbilical Hernia. She admits on 07/04 she had lots of discomfort. She feels better today.     Diabetes She presents for her follow-up diabetic visit. She has type 2 diabetes mellitus. Her disease course has been improving. Pertinent negatives for hypoglycemia include no dizziness or headaches. Pertinent negatives for diabetes include no blurred vision, no chest pain, no polydipsia, no polyphagia and no polyuria. There are no hypoglycemic complications. Risk factors for coronary artery disease include diabetes mellitus, dyslipidemia, hypertension and post-menopausal. Current diabetic treatment includes insulin injections. She is compliant with treatment most of the time. She is following a diabetic diet. She participates in exercise intermittently. Eye exam is current.  Hypertension This is a chronic problem. The current episode started more than 1 year ago. The problem has been gradually improving since onset. The problem is controlled. Pertinent negatives include no blurred vision, chest pain, headaches or palpitations. Past treatments include angiotensin blockers. The current treatment provides moderate improvement. Compliance problems include exercise and diet.  Hypertensive end-organ damage includes kidney disease. Identifiable causes  of hypertension include chronic renal disease.  Hyperlipidemia This is a chronic problem. The current episode started more than 1 year ago. Exacerbating diseases include chronic renal disease. Pertinent negatives include no chest pain. Current antihyperlipidemic treatment includes statins. The current treatment provides moderate improvement of lipids. Risk factors for coronary artery disease include diabetes mellitus, dyslipidemia, hypertension, post-menopausal and a sedentary lifestyle.     Past Medical History:  Diagnosis Date   CKD (chronic kidney disease), stage II    COPD (chronic obstructive pulmonary disease) (HCC)    Coronary artery disease 2005   cardiologist--- dr [place] Lions;    cardiac cath for acute angina 05-21-2004 multivessel disease;  cath after GI consult 12/ 2005  PCTA  w/ DES to prox RCA ;  cath 04-20-2006 patent stent with nonobstruction CAD;  NUC 12/ 2013 normal no ischemia;  last ETT 07-30-2022 no ischemia   Diverticulosis of colon    Dyslipidemia    Full dentures    History of anemia    History of hepatitis C virus infection    per pt was dx many yrs ago and treated in 2017  (noted in epic HCV RNA quan not detectable 10-25-2019)   Hypertension    OSA (obstructive sleep apnea)    study in epic 03-06-2019  moderate osa , (08-14-2022  pt stated she tried  cpap  but just decided to stop using it)   Osteoarthritis    PAD (peripheral artery disease) (HCC)    Peripheral neuropathy    Prolapse of anterior vaginal wall    S/P primary angioplasty with coronary stent 05/2004   PTCA and DES x1 to pRCA   Type 2 diabetes mellitus treated with insulin (HCC)    followed by pcp   (  08-14-2022  per pt checks blood sugar multiple times dialy w/ dexcom 7,  fasting average 101)   Uterovaginal prolapse, incomplete    Wears glasses      Family History  Problem Relation Age of Onset   CAD Father 23   Heart attack Father    CAD Mother 78   Heart attack Mother    CAD Brother     Cancer Brother        prostate   CAD Sister    Cancer Sister        breast   CAD Brother      Current Outpatient Medications:    acetaminophen (TYLENOL) 500 MG tablet, Take 1 tablet (500 mg total) by mouth every 6 (six) hours as needed (pain)., Disp: 30 tablet, Rfl: 0   amLODipine (NORVASC) 10 MG tablet, TAKE 1 TABLET(10 MG) BY MOUTH DAILY, Disp: 90 tablet, Rfl: 1   Ascorbic Acid (VITAMIN C) 500 MG CHEW, Chew by mouth daily., Disp: , Rfl:    aspirin EC 81 MG tablet, Take 81 mg by mouth daily. Swallow whole., Disp: , Rfl:    ciclopirox (PENLAC) 8 % solution, Apply one coat to toenail qd.  Remove weekly with polish remover. (Patient taking differently: Apply topically at bedtime. Apply one coat to toenail qd.  Remove weekly with polish remover.), Disp: 6.6 mL, Rfl: 11   Coenzyme Q10 (COQ-10) 100 MG CAPS, Take 1 tablet by mouth daily. , Disp: , Rfl:    Continuous Blood Gluc Sensor (DEXCOM G7 SENSOR) MISC, Use to check blood sugar 3 times a day. Dx code e11.65, Disp: 3 each, Rfl: 3   dapagliflozin propanediol (FARXIGA) 10 MG TABS tablet, Take 1 tablet (10 mg total) by mouth daily., Disp: 30 tablet, Rfl: 3   estradiol (ESTRACE) 0.1 MG/GM vaginal cream, Place 0.5 g vaginally 2 (two) times a week. Place 0.5g nightly for two weeks then twice a week after, Disp: 30 g, Rfl: 11   Glucagon (GVOKE HYPOPEN 2-PACK) 0.5 MG/0.1ML SOAJ, Use for blood sugars less than 60., Disp: 0.2 mL, Rfl: 3   glucosamine-chondroitin 500-400 MG tablet, Take 1 tablet by mouth daily., Disp: , Rfl:    glucose blood (ONETOUCH VERIO) test strip, CHECK BLOOD SUGAR THREE TIMES DAILY dx: e11.65, Disp: 150 each, Rfl: 11   ibuprofen (ADVIL) 600 MG tablet, Take 1 tablet (600 mg total) by mouth every 6 (six) hours as needed., Disp: 30 tablet, Rfl: 0   insulin lispro (HUMALOG KWIKPEN) 100 UNIT/ML KwikPen, Inject subcutaneously 2 units if sugars are 150 - 199, 4 units 200 -249, 6 units 250- 299, 8 units 300 - 349, 10 units >350 (Patient  taking differently: Inject into the skin as needed. Inject subcutaneously 2 units if sugars are 150 - 199, 4 units 200 -249, 6 units 250- 299, 8 units 300 - 349, 10 units >350), Disp: 15 mL, Rfl: 0   Insulin Pen Needle (BD PEN NEEDLE NANO 2ND GEN) 32G X 4 MM MISC, USE TO INJECT INSULIN DAILY, Disp: 100 each, Rfl: 2   MAGNESIUM OXIDE PO, Take 400 mg by mouth at bedtime., Disp: , Rfl:    Misc Natural Products (NEURIVA) CAPS, Take 1 capsule by mouth daily., Disp: , Rfl:    nystatin (NYSTATIN) powder, Apply topically 3 (three) times daily. As needed (Patient taking differently: Apply topically 3 (three) times daily as needed. As needed), Disp: 60 g, Rfl: 0   omeprazole (PRILOSEC) 20 MG capsule, TAKE 1 CAPSULE BY MOUTH DAILY,  Disp: 90 capsule, Rfl: 1   OneTouch Delica Lancets 33G MISC, USE AS DIRECTED TO CHECK BLOOD SUGAR THREE TIMES DAILY, Disp: 100 each, Rfl: 0   pregabalin (LYRICA) 50 MG capsule, Take 1 capsule (50 mg total) by mouth at bedtime. TAKE 1 CAPSULE(50 MG) BY MOUTH AT BEDTIME, Disp: 90 capsule, Rfl: 2   rosuvastatin (CRESTOR) 20 MG tablet, TAKE 1 TABLET(20 MG) BY MOUTH DAILY, Disp: 90 tablet, Rfl: 2   traZODone (DESYREL) 50 MG tablet, TAKE 1 TABLET BY MOUTH EVERY NIGHT AT BEDTIME, Disp: 90 tablet, Rfl: 1   TRULICITY 1.5 MG/0.5ML SOPN, ADMINISTER 1.5 MG UNDER THE SKIN 1 TIME A WEEK (Patient taking differently: Inject 1.5 mg into the skin once a week. Tuesday), Disp: 6 mL, Rfl: 3   Vitamin A 3 MG (10000 UT) TABS, Take 1 tablet by mouth daily. 2,400 mcg, Disp: , Rfl:    Vitamin D, Ergocalciferol, (DRISDOL) 1.25 MG (50000 UNIT) CAPS capsule, TAKE 1 CAPSULE BY MOUTH EVERY 7 DAYS, Disp: 12 capsule, Rfl: 1   vitamin E 600 UNIT capsule, Take 400 Units by mouth at bedtime., Disp: , Rfl:    Allergies  Allergen Reactions   Fish Allergy Hives    Cod   Latex Rash     Review of Systems  Constitutional: Negative.   Eyes:  Negative for blurred vision.  Respiratory: Negative.    Cardiovascular:  Negative.  Negative for chest pain and palpitations.  Endocrine: Negative for polydipsia, polyphagia and polyuria.  Neurological: Negative.  Negative for dizziness and headaches.  Psychiatric/Behavioral: Negative.       Today's Vitals   01/08/23 0953  BP: 118/82  Pulse: 75  Temp: 98.4 F (36.9 C)  SpO2: 98%  Weight: 166 lb 9.6 oz (75.6 kg)  Height: 5\' 5"  (1.651 m)   Body mass index is 27.72 kg/m.  Wt Readings from Last 3 Encounters:  01/08/23 166 lb 9.6 oz (75.6 kg)  09/02/22 169 lb (76.7 kg)  08/18/22 172 lb 1.6 oz (78.1 kg)    The 10-year ASCVD risk score (Arnett DK, et al., 2019) is: 21.2%   Values used to calculate the score:     Age: 89 years     Sex: Female     Is Non-Hispanic African American: Yes     Diabetic: Yes     Tobacco smoker: No     Systolic Blood Pressure: 118 mmHg     Is BP treated: Yes     HDL Cholesterol: 59 mg/dL     Total Cholesterol: 157 mg/dL  Objective:  Physical Exam Vitals and nursing note reviewed.  Constitutional:      Appearance: Normal appearance.  HENT:     Head: Normocephalic and atraumatic.  Eyes:     Extraocular Movements: Extraocular movements intact.  Cardiovascular:     Rate and Rhythm: Normal rate and regular rhythm.     Heart sounds: Normal heart sounds.  Pulmonary:     Effort: Pulmonary effort is normal.     Breath sounds: Normal breath sounds.  Abdominal:     General: Bowel sounds are normal.     Palpations: Abdomen is soft.       Comments: Supraumbilical hernia -reducible, non-tender  Musculoskeletal:     Cervical back: Normal range of motion.  Skin:    General: Skin is warm.  Neurological:     General: No focal deficit present.     Mental Status: She is alert.  Psychiatric:  Mood and Affect: Mood normal.        Behavior: Behavior normal.         Assessment And Plan:  Hypertensive heart and renal disease with renal failure, stage 1 through stage 4 or unspecified chronic kidney disease, without  heart failure -     Lipid panel -     CMP14+EGFR -     Amb Referral To Provider Referral Exercise Program (P.R.E.P)  Type 2 diabetes mellitus with stage 2 chronic kidney disease, with long-term current use of insulin (HCC) -     Lipid panel -     Hemoglobin A1c -     CMP14+EGFR -     Amb Referral To Provider Referral Exercise Program (P.R.E.P)  Pure hypercholesterolemia -     Lipid panel -     CMP14+EGFR -     Amb Referral To Provider Referral Exercise Program (P.R.E.P)  Atherosclerosis of aorta (HCC) -     Amb Referral To Provider Referral Exercise Program (P.R.E.P)  Supraumbilical hernia without gangrene and without obstruction -     Ambulatory referral to General Surgery  Overweight with body mass index (BMI) of 27 to 27.9 in adult -     Amb Referral To Provider Referral Exercise Program (P.R.E.P)    Return if symptoms worsen or fail to improve.  Patient was given opportunity to ask questions. Patient verbalized understanding of the plan and was able to repeat key elements of the plan. All questions were answered to their satisfaction.    I, [name], MD, have reviewed all documentation for this visit. The documentation on 01/08/23 for the exam, diagnosis, procedures, and orders are all accurate and complete.   IF YOU HAVE BEEN REFERRED TO A SPECIALIST, IT MAY TAKE 1-2 WEEKS TO SCHEDULE/PROCESS THE REFERRAL. IF YOU HAVE NOT HEARD FROM US/SPECIALIST IN TWO WEEKS, PLEASE GIVE [place] A CALL AT [phone].

## 2023-01-09 LAB — CMP14+EGFR
ALT: 13 IU/L (ref 0–32)
AST: 17 IU/L (ref 0–40)
Albumin: 4.5 g/dL (ref 3.8–4.8)
Alkaline Phosphatase: 165 IU/L — ABNORMAL HIGH (ref 44–121)
BUN/Creatinine Ratio: 21 (ref 12–28)
BUN: 17 mg/dL (ref 8–27)
Bilirubin Total: 0.3 mg/dL (ref 0.0–1.2)
CO2: 25 mmol/L (ref 20–29)
Calcium: 9.4 mg/dL (ref 8.7–10.3)
Chloride: 99 mmol/L (ref 96–106)
Creatinine, Ser: 0.8 mg/dL (ref 0.57–1.00)
Globulin, Total: 2.8 g/dL (ref 1.5–4.5)
Glucose: 100 mg/dL — ABNORMAL HIGH (ref 70–99)
Potassium: 4.6 mmol/L (ref 3.5–5.2)
Sodium: 144 mmol/L (ref 134–144)
Total Protein: 7.3 g/dL (ref 6.0–8.5)
eGFR: 78 mL/min/{1.73_m2} (ref >59)

## 2023-01-09 LAB — LIPID PANEL
Chol/HDL Ratio: 2.7 ratio (ref 0.0–4.4)
Cholesterol, Total: 171 mg/dL (ref 100–199)
HDL: 64 mg/dL (ref >39)
LDL Chol Calc (NIH): 87 mg/dL (ref 0–99)
Triglycerides: 111 mg/dL (ref 0–149)
VLDL Cholesterol Cal: 20 mg/dL (ref 5–40)

## 2023-01-09 LAB — HEMOGLOBIN A1C
Est. average glucose Bld gHb Est-mCnc: 157 mg/dL
Hgb A1c MFr Bld: 7.1 % — ABNORMAL HIGH (ref 4.8–5.6)

## 2023-01-12 NOTE — Assessment & Plan Note (Signed)
Chronic, LDL goal < 70.  She will c/w rosuvastatin 20mg  daily. Encouraged to follow a heart healthy lifestyle.

## 2023-01-12 NOTE — Assessment & Plan Note (Addendum)
Chronic, slight diastolic elevation noted. Encouraged to follow low sodium diet. She will c/w amlodipine 10mg  daily. We discussed the need for increased exercise. She agrees to referral to the PREP program.

## 2023-01-12 NOTE — Assessment & Plan Note (Signed)
I will refer her to General Surgery for further evaluation. Umbilical/supraumbilical hernias seen on CT A/P 2022. However, she has not been symptomatic until now.

## 2023-01-12 NOTE — Assessment & Plan Note (Signed)
Her BMI is acceptable for her demographic. She is encouraged to aim for at least 150 minutes of exercise/week.

## 2023-01-12 NOTE — Assessment & Plan Note (Signed)
Chronic, LDL goal < 70. She will c/w rosuvastatin 20mg  daily.

## 2023-01-12 NOTE — Assessment & Plan Note (Signed)
Chronic, she will c/w Farxiga 10mg  and Trulicity 1.5mg  weekly.  She also uses Humalog per sliding scal. She admits she has had elevated sugars recently, likely due to dietary indiscretion. Will check labs and adjust meds as needed.

## 2023-01-13 ENCOUNTER — Ambulatory Visit: Payer: Medicare HMO | Admitting: Internal Medicine

## 2023-01-15 ENCOUNTER — Telehealth: Payer: Self-pay

## 2023-01-15 NOTE — Addendum Note (Signed)
Addended by: [name] on: 01/15/2023 01:07 PM   Modules accepted: Level of Service

## 2023-01-15 NOTE — Telephone Encounter (Signed)
Returned my call, explained PREP, she would like to attend at [facility], will ask Pam RN [facility] contact her with schedule/dates/times.

## 2023-01-15 NOTE — Telephone Encounter (Signed)
Call to pt reference PREP at [facility] Confirmed interested. Prefers mornings. Can start on 01/19/23 Can't do intake today but can come in a bit early on Monday to manage paperwork and complete goals, VS, measurements. Will meet pt at front desk.

## 2023-01-15 NOTE — Telephone Encounter (Signed)
Called re: PREP program referral, left voicemail requesting return call.  

## 2023-01-19 NOTE — Progress Notes (Signed)
[facility] PREP Evaluation  Patient Details  Name: [name] [name] MRN: [id number] Date of Birth: 1951-01-29 Age: 72 y.o. PCP: [name], MD  Vitals:   01/19/23 1000  BP: 126/74  Pulse: 71  SpO2: 99%  Weight: 165 lb 12.8 oz (75.2 kg)     [facility] Eval - 01/19/23 1100       [facility] "PREP" Location   [facility] "PREP" Location [facility]      Referral    Referring Provider Sanders    Reason for referral Hypertension;Inactivity;Diabetes    Program Start Date 01/19/23   1030a-1145a x 12 wks     Measurement   Waist Circumference 36 inches    Hip Circumference 38 inches    Body fat 27.6 percent      Information for Trainer   Goals tone up, strengthen right side so she can do more    Current Exercise cooking, grocery shopping    Orthopedic Concerns Arthritis, hands, knees, feet, right side is weak    Pertinent Medical History HTN, CAD, CKD, OSA/COPD, DM2    Current Barriers none    Restrictions/Precautions Diabetic snack before exercise    Medications that affect exercise Medication causing dizziness/drowsiness      Timed Up and Go (TUGS)   Timed Up and Go Low risk <9 seconds   no falls either     Mobility and Daily Activities   I find it easy to walk up or down two or more flights of stairs. 3    I have no trouble taking out the trash. 4    I do housework such as vacuuming and dusting on my own without difficulty. 4    I can easily lift a gallon of milk (8lbs). 4    I can easily walk a mile. 4    I have no trouble reaching into high cupboards or reaching down to pick up something from the floor. 3    I do not have trouble doing out-door work such as Loss adjuster, chartered, raking leaves, or gardening. 2      Mobility and Daily Activities   I feel younger than my age. 4    I feel independent. 4    I feel energetic. 3    I live an active life.  4    I feel strong. 3    I feel healthy. 3    I feel active as other people my age. 4      How fit and strong are you.   Fit and Strong  Total Score 49            Past Medical History:  Diagnosis Date   CKD (chronic kidney disease), stage II    COPD (chronic obstructive pulmonary disease) (HCC)    Coronary artery disease 2005   cardiologist--- dr [place] Lions;    cardiac cath for acute angina 05-21-2004 multivessel disease;  cath after GI consult 12/ 2005  PCTA  w/ DES to prox RCA ;  cath 04-20-2006 patent stent with nonobstruction CAD;  NUC 12/ 2013 normal no ischemia;  last ETT 07-30-2022 no ischemia   Diverticulosis of colon    Dyslipidemia    Full dentures    History of anemia    History of hepatitis C virus infection    per pt was dx many yrs ago and treated in 2017  (noted in epic HCV RNA quan not detectable 10-25-2019)   Hypertension    OSA (obstructive sleep apnea)  study in epic 03-06-2019  moderate osa , (08-14-2022  pt stated she tried  cpap  but just decided to stop using it)   Osteoarthritis    PAD (peripheral artery disease) (HCC)    Peripheral neuropathy    Prolapse of anterior vaginal wall    S/P primary angioplasty with coronary stent 05/2004   PTCA and DES x1 to pRCA   Type 2 diabetes mellitus treated with insulin (HCC)    followed by pcp   (08-14-2022  per pt checks blood sugar multiple times dialy w/ dexcom 7,  fasting average 101)   Uterovaginal prolapse, incomplete    Wears glasses    Past Surgical History:  Procedure Laterality Date   ANTERIOR AND POSTERIOR REPAIR WITH SACROSPINOUS FIXATION N/A 08/18/2022   Procedure: ANTERIOR AND POSTERIOR REPAIR, PERINEORRHAPHY WITH SACROSPINOUS FIXATION;  Surgeon: Marguerita Beards, MD;  Location: [facility] [place];  Service: Gynecology;  Laterality: N/A;  Total time requested is 1.5hrs   APPENDECTOMY  2003   BREAST EXCISIONAL BIOPSY Right    age 72;   benign   CARDIAC CATHETERIZATION  05/21/2004   @[facility]  by dr s. Diona Browner;   acute angina;   multivessel disease;   pRCA 80%,  dRCA 50-60%,  dLM 40%,  ostialLAD 50-60%,  pCfx 30%,  LVEF 60%    CARDIAC CATHETERIZATION  04/20/2006   @[facility]  by dr w. Samule Ohm;  angina;  patent RCA stent,  minimal nonobstructive coronary disease   CARPAL TUNNEL RELEASE Right 2005   CATARACT EXTRACTION W/ INTRAOCULAR LENS IMPLANT Bilateral 2022   COLONOSCOPY WITH PROPOFOL  10/12/2019   dr Marca Ancona   CORONARY ANGIOPLASTY WITH STENT PLACEMENT  05/24/2004   @[facility]  by dr Lacretia Nicks. Samule Ohm;  after GI consult for bleed;   PTCA and DES x1  pRCA   CYSTOSCOPY N/A 08/18/2022   Procedure: CYSTOSCOPY;  Surgeon: Marguerita Beards, MD;  Location: [facility];  Service: Gynecology;  Laterality: N/A;   TONSILLECTOMY     yrs ago   Social History   Tobacco Use  Smoking Status Former   Current packs/day: 0.00   Average packs/day: 0.5 packs/day for 30.0 years (15.0 ttl pk-yrs)   Types: Cigarettes   Start date: 12/21/1983   Quit date: 12/20/2013   Years since quitting: 9.0  Smokeless Tobacco Never    [name] [name] 01/19/2023, 11:38 AM

## 2023-01-21 NOTE — Progress Notes (Signed)
[name] PREP Weekly Session  Patient Details  Name: [name] [name] MRN: [id number] Date of Birth: 1950/08/31 Age: 72 y.o. PCP: [name], MD  There were no vitals filed for this visit.   [name] Weekly seesion - 01/21/23 1500       [name] "PREP" Location   [name] "PREP" Location [name] [name]      Weekly Session   Topic Discussed Goal setting and welcome to the program   Fit testing done   Classes attended to date 2             [name] 01/21/2023, 3:54 PM

## 2023-01-23 DIAGNOSIS — E1165 Type 2 diabetes mellitus with hyperglycemia: Secondary | ICD-10-CM | POA: Diagnosis not present

## 2023-01-27 NOTE — Progress Notes (Signed)
[facility] PRE[facility] Weekly Session  Patient Details  Name: [name] [name] MRN: [id number] Date of Birth: 1951/02/14 Age: 72 y.o. PCP: [name], MD  Vitals:   01/26/23 1030  Weight: 166 lb (75.3 kg)     [facility] Weekly seesion - 01/27/23 1100       [facility] "PREP" Location   [facility] "PREP" Location [facility]      Weekly Session   Topic Discussed Importance of resistance training;Other ways to be active    Minutes exercised this week 15 minutes    Classes attended to date 3             [name] 01/27/2023, 11:46 AM

## 2023-01-30 ENCOUNTER — Other Ambulatory Visit: Payer: Self-pay | Admitting: Internal Medicine

## 2023-01-30 DIAGNOSIS — Z1231 Encounter for screening mammogram for malignant neoplasm of breast: Secondary | ICD-10-CM

## 2023-02-03 ENCOUNTER — Ambulatory Visit: Payer: Medicare HMO

## 2023-02-04 ENCOUNTER — Ambulatory Visit
Admission: RE | Admit: 2023-02-04 | Discharge: 2023-02-04 | Disposition: A | Discharge status: Home / Self Care | Payer: Medicare HMO | Source: Ambulatory Visit | Attending: Internal Medicine | Admitting: Internal Medicine

## 2023-02-04 DIAGNOSIS — K439 Ventral hernia without obstruction or gangrene: Secondary | ICD-10-CM | POA: Diagnosis not present

## 2023-02-04 DIAGNOSIS — Z1231 Encounter for screening mammogram for malignant neoplasm of breast: Secondary | ICD-10-CM

## 2023-02-05 ENCOUNTER — Other Ambulatory Visit: Payer: Self-pay

## 2023-02-05 DIAGNOSIS — E1122 Type 2 diabetes mellitus with diabetic chronic kidney disease: Secondary | ICD-10-CM

## 2023-02-05 MED ORDER — DAPAGLIFLOZIN PROPANEDIOL 10 MG PO TABS
10.0000 mg | ORAL_TABLET | Freq: Every day | ORAL | 1 refills | Status: DC
Start: 2023-02-05 — End: 2023-03-05

## 2023-02-05 NOTE — Progress Notes (Signed)
[facility] PRE[facility] Weekly Session  Patient Details  Name: [name] [name] MRN: [id number] Date of Birth: April 10, 1951 Age: 72 y.o. PCP: [name], MD  Vitals:   02/02/23 1030  Weight: 168 lb (76.2 kg)     [facility] Weekly seesion - 02/05/23 1600       [facility] "PREP" Location   [facility]PREP[facility] Location [facility]      Weekly Session   Topic Discussed Healthy eating tips    Minutes exercised this week 90 minutes    Classes attended to date 5             [name] 02/05/2023, 4:52 PM

## 2023-02-10 NOTE — Progress Notes (Signed)
[facility] PRE[facility] Weekly Session  Patient Details  Name: [name] [name] MRN: [id number] Date of Birth: 1950-06-30 Age: 72 y.o. PCP: [name], MD  Vitals:   02/09/23 1030  Weight: 165 lb 6.4 oz (75 kg)     [facility] Weekly seesion - 02/10/23 1600       [facility] "PREP" Location   [facility]PREP[facility] Location [facility]      Weekly Session   Topic Discussed Health habits    Minutes exercised this week 30 minutes    Classes attended to date 6             [name] 02/10/2023, 4:57 PM

## 2023-02-13 ENCOUNTER — Other Ambulatory Visit: Payer: Self-pay | Admitting: Internal Medicine

## 2023-02-18 ENCOUNTER — Ambulatory Visit: Payer: Medicare HMO | Admitting: Podiatry

## 2023-02-18 DIAGNOSIS — Q828 Other specified congenital malformations of skin: Secondary | ICD-10-CM | POA: Diagnosis not present

## 2023-02-18 DIAGNOSIS — M79675 Pain in left toe(s): Secondary | ICD-10-CM | POA: Diagnosis not present

## 2023-02-18 DIAGNOSIS — B351 Tinea unguium: Secondary | ICD-10-CM

## 2023-02-18 DIAGNOSIS — M79674 Pain in right toe(s): Secondary | ICD-10-CM | POA: Diagnosis not present

## 2023-02-18 DIAGNOSIS — E119 Type 2 diabetes mellitus without complications: Secondary | ICD-10-CM | POA: Diagnosis not present

## 2023-02-18 NOTE — Progress Notes (Signed)
[facility] PRE[facility] Weekly Session  Patient Details  Name: [name] [name] MRN: [id number] Date of Birth: 1950-11-24 Age: 72 y.o. PCP: [name], MD  Vitals:   02/16/23 1030  Weight: 165 lb 6.4 oz (75 kg)     [facility] Weekly seesion - 02/18/23 1500       [facility] "PREP" Location   [facility]PREP[facility] Location [facility]      Weekly Session   Topic Discussed Restaurant Eating   Salt and sugar demos   Minutes exercised this week 80 minutes    Classes attended to date 36             Pam Jerral Bonito 02/18/2023, 3:13 PM

## 2023-02-21 ENCOUNTER — Encounter: Payer: Self-pay | Admitting: Podiatry

## 2023-02-21 NOTE — Progress Notes (Signed)
  Subjective:  Patient ID: [name] [name], female    DOB: April 25, 1951,  MRN: [id number]  Myda Canchola presents to clinic today for preventative diabetic foot care and painful porokeratotic lesion(s) of both feet and painful mycotic toenails that limit ambulation. Painful toenails interfere with ambulation. Aggravating factors include wearing enclosed shoe gear. Pain is relieved with periodic professional debridement. Painful porokeratotic lesions are aggravated when weightbearing with and without shoegear. Pain is relieved with periodic professional debridement.  Chief Complaint  Patient presents with   Nail Problem    DFC,Referring Provider [name], MD,lov:07/24,A1C:7.1      New problem(s): None.   PCP is [name], MD.  Allergies  Allergen Reactions   Fish Allergy Hives    Cod   Latex Rash    Review of Systems: Negative except as noted in the HPI.  Objective: No changes noted in today's physical examination. There were no vitals filed for this visit. [name] [name] is a pleasant 72 y.o. female WD, WN in NAD. AAO x 3.  Vascular Examination: Capillary refill time immediate b/l. Vascular status intact b/l with palpable pedal pulses. Pedal hair present b/l. No edema. No pain with calf compression b/l. Skin temperature gradient WNL b/l. No ischemia or gangrene noted b/l LE. No cyanosis or clubbing noted b/l LE.  Neurological Examination: Sensation grossly intact b/l with 10 gram monofilament. Vibratory sensation intact b/l. Protective sensation diminished with 10g monofilament b/l. Vibratory sensation intact b/l.  Dermatological Examination: Pedal skin with normal turgor, texture and tone b/l.   Toenails 1-5 b/l thick, discolored, elongated with subungual debris and pain on dorsal palpation. No open wounds b/l LE. No interdigital macerations noted b/l LE.   Porokeratotic lesion(s) plantar aspect of left foot x 2 and plantar aspect of right foot x 2. No erythema, no  edema, no drainage, no fluctuance.  Musculoskeletal Examination: Normal muscle strength 5/5 to all lower extremity muscle groups bilaterally. HAV with bunion bilaterally and hammertoes 2-5 b/l. Pes planus deformity noted bilateral LE.Marland Kitchen No pain, crepitus or joint limitation noted with ROM b/l LE.  Patient ambulates independently without assistive aids.  Assessment/Plan: 1. Pain due to onychomycosis of toenails of both feet   2. Porokeratosis   3. Diabetes mellitus without complication (HCC)    -Patient was evaluated and treated. All patient's and/or POA's questions/concerns answered on today's visit. -Continue foot and shoe inspections daily. Monitor blood glucose per PCP/Endocrinologist's recommendations. -Continue supportive shoe gear daily. -Toenails 1-5 b/l were debrided in length and girth with sterile nail nippers and dremel without iatrogenic bleeding.  -Porokeratotic lesion(s) plantarlateral aspect of midfoot left foot x 2 and plantarlateral aspect of midfoot right foot x 2 pared and enucleated with sterile currette without incident. Total number of lesions debrided=4. -Patient/POA to call should there be question/concern in the interim.   Return in about 3 months (around 05/21/2023).  Freddie Breech, DPM

## 2023-03-03 ENCOUNTER — Encounter: Payer: Self-pay | Admitting: Internal Medicine

## 2023-03-03 ENCOUNTER — Ambulatory Visit (INDEPENDENT_AMBULATORY_CARE_PROVIDER_SITE_OTHER): Payer: Medicare HMO | Admitting: Internal Medicine

## 2023-03-03 VITALS — BP 118/74 | HR 74 | Temp 98.1°F | Ht 65.0 in | Wt 168.0 lb

## 2023-03-03 DIAGNOSIS — J029 Acute pharyngitis, unspecified: Secondary | ICD-10-CM | POA: Insufficient documentation

## 2023-03-03 DIAGNOSIS — J449 Chronic obstructive pulmonary disease, unspecified: Secondary | ICD-10-CM

## 2023-03-03 DIAGNOSIS — J069 Acute upper respiratory infection, unspecified: Secondary | ICD-10-CM

## 2023-03-03 DIAGNOSIS — G4733 Obstructive sleep apnea (adult) (pediatric): Secondary | ICD-10-CM

## 2023-03-03 DIAGNOSIS — R0982 Postnasal drip: Secondary | ICD-10-CM | POA: Insufficient documentation

## 2023-03-03 NOTE — Patient Instructions (Signed)
Sore Throat A sore throat is pain, burning, irritation, or scratchiness in the throat. When you have a sore throat, you may feel pain or tenderness in your throat when you swallow or talk. Many things can cause a sore throat, including: An infection. Seasonal allergies. Dryness in the air. Irritants, such as smoke or pollution. Radiation treatment for cancer. Gastroesophageal reflux disease (GERD). A tumor. A sore throat is often the first sign of another sickness. It may happen with other symptoms, such as coughing, sneezing, fever, and swollen neck glands. Most sore throats go away without medical treatment. Follow these instructions at home:     Medicines Take over-the-counter and prescription medicines only as told by your health care provider. Children often get sore throats. Do not give your child aspirin because of the association with Reye's syndrome. Use throat sprays to soothe your throat as told by your health care provider. Managing pain To help with pain, try: Sipping warm liquids, such as broth, herbal tea, or warm water. Eating or drinking cold or frozen liquids, such as frozen ice pops. Gargling with a mixture of salt and water 3-4 times a day or as needed. To make salt water, completely dissolve -1 tsp (3-6 g) of salt in 1 cup (237 mL) of warm water. Sucking on hard candy or throat lozenges. Putting a cool-mist humidifier in your bedroom at night to moisten the air. Sitting in the bathroom with the door closed for 5-10 minutes while you run hot water in the shower. General instructions Do not use any products that contain nicotine or tobacco. These products include cigarettes, chewing tobacco, and vaping devices, such as e-cigarettes. If you need help quitting, ask your health care provider. Rest as needed. Drink enough fluid to keep your urine pale yellow. Wash your hands often with soap and water for at least 20 seconds. If soap and water are not available, use hand  sanitizer. Contact a health care provider if: You have a fever for more than 2-3 days. You have symptoms that last for more than 2-3 days. Your throat does not get better within 7 days. You have a fever and your symptoms suddenly get worse. Get help right away if: You have difficulty breathing. You cannot swallow fluids, soft foods, or your saliva. You have increased swelling in your throat or neck. You have persistent nausea and vomiting. These symptoms may represent a serious problem that is an emergency. Do not wait to see if the symptoms will go away. Get medical help right away. Call your local emergency services (911 in the [place] Do not drive yourself to the hospital. Summary A sore throat is pain, burning, irritation, or scratchiness in the throat. Many things can cause a sore throat. Take over-the-counter medicines only as told by your health care provider. Rest as needed. Drink enough fluid to keep your urine pale yellow. Contact a health care provider if your throat does not get better within 7 days. This information is not intended to replace advice given to you by your health care provider. Make sure you discuss any questions you have with your health care provider. Document Revised: 09/05/2020 Document Reviewed: 09/05/2020 Elsevier Patient Education  2024 [employer].

## 2023-03-03 NOTE — Progress Notes (Signed)
I,[name], CMA,acting as a Neurosurgeon for [name], MD.,have documented all relevant documentation on the behalf of [name], MD,as directed by  [name], MD while in the presence of [name], MD.  Subjective:  Patient ID: [name] [name] , female    DOB: 1950/09/12 , 72 y.o.   MRN: [id number]  Chief Complaint  Patient presents with   Sore Throat   Nasal Congestion    HPI  Patient presents today for cold like symptoms. She reports experiencing sore throat, sinus congestion and dry cough.  She states her great nephew tested positive for Covid on Thursday.   At home she has used Coricidin & cough drops. She admits these have helped. Her home COVID tests have been negative.      Past Medical History:  Diagnosis Date   CKD (chronic kidney disease), stage II    COPD (chronic obstructive pulmonary disease) (HCC)    Coronary artery disease 2005   cardiologist--- dr [place] Lions;    cardiac cath for acute angina 05-21-2004 multivessel disease;  cath after GI consult 12/ 2005  PCTA  w/ DES to prox RCA ;  cath 04-20-2006 patent stent with nonobstruction CAD;  NUC 12/ 2013 normal no ischemia;  last ETT 07-30-2022 no ischemia   Diverticulosis of colon    Dyslipidemia    Full dentures    History of anemia    History of hepatitis C virus infection    per pt was dx many yrs ago and treated in 2017  (noted in epic HCV RNA quan not detectable 10-25-2019)   Hypertension    OSA (obstructive sleep apnea)    study in epic 03-06-2019  moderate osa , (08-14-2022  pt stated she tried  cpap  but just decided to stop using it)   Osteoarthritis    PAD (peripheral artery disease) (HCC)    Peripheral neuropathy    Prolapse of anterior vaginal wall    S/P primary angioplasty with coronary stent 05/2004   PTCA and DES x1 to pRCA   Type 2 diabetes mellitus treated with insulin (HCC)    followed by pcp   (08-14-2022  per pt checks blood sugar multiple times dialy w/ dexcom 7,   fasting average 101)   Uterovaginal prolapse, incomplete    Wears glasses      Family History  Problem Relation Age of Onset   CAD Father 42   Heart attack Father    CAD Mother 46   Heart attack Mother    CAD Brother    Cancer Brother        prostate   CAD Sister    Cancer Sister        breast   CAD Brother      Current Outpatient Medications:    acetaminophen (TYLENOL) 500 MG tablet, Take 1 tablet (500 mg total) by mouth every 6 (six) hours as needed (pain)., Disp: 30 tablet, Rfl: 0   amLODipine (NORVASC) 10 MG tablet, TAKE 1 TABLET(10 MG) BY MOUTH DAILY, Disp: 90 tablet, Rfl: 1   Ascorbic Acid (VITAMIN C) 500 MG CHEW, Chew by mouth daily., Disp: , Rfl:    aspirin EC 81 MG tablet, Take 81 mg by mouth daily. Swallow whole., Disp: , Rfl:    ciclopirox (PENLAC) 8 % solution, Apply one coat to toenail qd.  Remove weekly with polish remover. (Patient taking differently: Apply topically at bedtime. Apply one coat to toenail qd.  Remove weekly with polish  remover.), Disp: 6.6 mL, Rfl: 11   Coenzyme Q10 (COQ-10) 100 MG CAPS, Take 1 tablet by mouth daily. , Disp: , Rfl:    Continuous Blood Gluc Sensor (DEXCOM G7 SENSOR) MISC, Use to check blood sugar 3 times a day. Dx code e11.65, Disp: 3 each, Rfl: 3   estradiol (ESTRACE) 0.1 MG/GM vaginal cream, Place 0.5 g vaginally 2 (two) times a week. Place 0.5g nightly for two weeks then twice a week after, Disp: 30 g, Rfl: 11   Glucagon (GVOKE HYPOPEN 2-PACK) 0.5 MG/0.1ML SOAJ, Use for blood sugars less than 60., Disp: 0.2 mL, Rfl: 3   glucosamine-chondroitin 500-400 MG tablet, Take 1 tablet by mouth daily., Disp: , Rfl:    glucose blood (ONETOUCH VERIO) test strip, CHECK BLOOD SUGAR THREE TIMES DAILY dx: e11.65, Disp: 150 each, Rfl: 11   ibuprofen (ADVIL) 600 MG tablet, Take 1 tablet (600 mg total) by mouth every 6 (six) hours as needed., Disp: 30 tablet, Rfl: 0   insulin lispro (HUMALOG KWIKPEN) 100 UNIT/ML KwikPen, Inject subcutaneously 2 units  if sugars are 150 - 199, 4 units 200 -249, 6 units 250- 299, 8 units 300 - 349, 10 units >350 (Patient taking differently: Inject into the skin as needed. Inject subcutaneously 2 units if sugars are 150 - 199, 4 units 200 -249, 6 units 250- 299, 8 units 300 - 349, 10 units >350), Disp: 15 mL, Rfl: 0   Insulin Pen Needle (BD PEN NEEDLE NANO 2ND GEN) 32G X 4 MM MISC, USE TO INJECT INSULIN DAILY, Disp: 100 each, Rfl: 2   MAGNESIUM OXIDE PO, Take 400 mg by mouth at bedtime., Disp: , Rfl:    Misc Natural Products (NEURIVA) CAPS, Take 1 capsule by mouth daily., Disp: , Rfl:    nystatin (NYSTATIN) powder, Apply topically 3 (three) times daily. As needed (Patient taking differently: Apply topically 3 (three) times daily as needed. As needed), Disp: 60 g, Rfl: 0   omeprazole (PRILOSEC) 20 MG capsule, TAKE 1 CAPSULE BY MOUTH DAILY, Disp: 90 capsule, Rfl: 1   OneTouch Delica Lancets 33G MISC, USE AS DIRECTED TO CHECK BLOOD SUGAR THREE TIMES DAILY, Disp: 100 each, Rfl: 0   pregabalin (LYRICA) 50 MG capsule, Take 1 capsule (50 mg total) by mouth at bedtime. TAKE 1 CAPSULE(50 MG) BY MOUTH AT BEDTIME, Disp: 90 capsule, Rfl: 2   rosuvastatin (CRESTOR) 20 MG tablet, TAKE 1 TABLET(20 MG) BY MOUTH DAILY, Disp: 90 tablet, Rfl: 2   traZODone (DESYREL) 50 MG tablet, TAKE 1 TABLET BY MOUTH EVERY NIGHT AT BEDTIME, Disp: 90 tablet, Rfl: 1   Vitamin A 3 MG (10000 UT) TABS, Take 1 tablet by mouth daily. 2,400 mcg, Disp: , Rfl:    Vitamin D, Ergocalciferol, (DRISDOL) 1.25 MG (50000 UNIT) CAPS capsule, TAKE 1 CAPSULE BY MOUTH EVERY 7 DAYS, Disp: 12 capsule, Rfl: 1   vitamin E 600 UNIT capsule, Take 400 Units by mouth at bedtime., Disp: , Rfl:    dapagliflozin propanediol (FARXIGA) 10 MG TABS tablet, Take 1 tablet (10 mg total) by mouth daily before breakfast., Disp: 90 tablet, Rfl: 2   Dulaglutide (TRULICITY) 1.5 MG/0.5ML SOPN, Inject 1.5 mg into the skin once a week. Tuesday, Disp: 6 mL, Rfl: 3   Allergies  Allergen Reactions    Fish Allergy Hives    Cod   Latex Rash     Review of Systems  Constitutional: Negative.   HENT:  Positive for postnasal drip and sore throat.   Respiratory:  Negative.    Cardiovascular: Negative.   Gastrointestinal: Negative.   Neurological: Negative.   Psychiatric/Behavioral: Negative.       Today's Vitals   03/03/23 1411  BP: 118/74  Pulse: 74  Temp: 98.1 F (36.7 C)  SpO2: 98%  Weight: 168 lb (76.2 kg)  Height: 5\' 5"  (1.651 m)   Body mass index is 27.96 kg/m.  Wt Readings from Last 3 Encounters:  03/03/23 168 lb (76.2 kg)  02/16/23 165 lb 6.4 oz (75 kg)  02/09/23 165 lb 6.4 oz (75 kg)     Objective:  Physical Exam Vitals and nursing note reviewed.  Constitutional:      Appearance: Normal appearance. She is well-developed.  HENT:     Head: Normocephalic and atraumatic.     Right Ear: Tympanic membrane and ear canal normal.     Left Ear: Tympanic membrane and ear canal normal.     Mouth/Throat:     Pharynx: No oropharyngeal exudate or posterior oropharyngeal erythema.     Tonsils: No tonsillar exudate or tonsillar abscesses.  Cardiovascular:     Rate and Rhythm: Normal rate and regular rhythm.     Heart sounds: Normal heart sounds.  Pulmonary:     Effort: Pulmonary effort is normal.     Breath sounds: Normal breath sounds.  Skin:    General: Skin is warm.  Neurological:     General: No focal deficit present.     Mental Status: She is alert.  Psychiatric:        Mood and Affect: Mood normal.        Behavior: Behavior normal.         Assessment And Plan:  Viral URI with cough Assessment & Plan: She was given samples of Zyrtec, advised to use 1/2 tab nightly. Avoid dairy. Stay well hydrated, drink at least one hot beverage daily.    OSA and COPD overlap syndrome (HCC) Assessment & Plan: Chronic, encouraged to use CPAP at least four hours per night.       Return if symptoms worsen or fail to improve.  Patient was given opportunity to ask  questions. Patient verbalized understanding of the plan and was able to repeat key elements of the plan. All questions were answered to their satisfaction.    I, [name], MD, have reviewed all documentation for this visit. The documentation on 03/03/23 for the exam, diagnosis, procedures, and orders are all accurate and complete.  IF YOU HAVE BEEN REFERRED TO A SPECIALIST, IT MAY TAKE 1-2 WEEKS TO SCHEDULE/PROCESS THE REFERRAL. IF YOU HAVE NOT HEARD FROM US/SPECIALIST IN TWO WEEKS, PLEASE GIVE [place] A CALL AT [phone].   THE PATIENT IS ENCOURAGED TO PRACTICE SOCIAL DISTANCING DUE TO THE COVID-19 PANDEMIC.

## 2023-03-05 ENCOUNTER — Other Ambulatory Visit: Payer: Self-pay | Admitting: Internal Medicine

## 2023-03-05 DIAGNOSIS — E1122 Type 2 diabetes mellitus with diabetic chronic kidney disease: Secondary | ICD-10-CM

## 2023-03-05 MED ORDER — TRULICITY 1.5 MG/0.5ML ~~LOC~~ SOAJ: 1.5000 mg | SUBCUTANEOUS | 3 refills | Status: DC

## 2023-03-05 MED ORDER — DAPAGLIFLOZIN PROPANEDIOL 10 MG PO TABS
10.0000 mg | ORAL_TABLET | Freq: Every day | ORAL | 2 refills | Status: DC
Start: 2023-03-05 — End: 2023-04-07

## 2023-03-15 NOTE — Assessment & Plan Note (Signed)
Chronic, encouraged to use CPAP at least four hours per night.

## 2023-03-15 NOTE — Assessment & Plan Note (Signed)
She was given samples of Zyrtec, advised to use 1/2 tab nightly. Avoid dairy. Stay well hydrated, drink at least one hot beverage daily.

## 2023-03-23 NOTE — Progress Notes (Signed)
[facility] PREP Weekly Session  Patient Details  Name: [name] [name] MRN: [id number] Date of Birth: 1951-05-03 Age: 72 y.o. PCP: [name], MD  Vitals:   03/23/23 1030  Weight: 167 lb 3.2 oz (75.8 kg)     [facility] Weekly seesion - 03/23/23 1300       [facility] "PREP" Location   [facility]PREP[facility] Location [facility]      Weekly Session   Topic Discussed Eating for the season;Water   review of healthy habits   Minutes exercised this week 210 minutes    Classes attended to date 38             Pam Jerral Bonito 03/23/2023, 1:32 PM

## 2023-04-07 ENCOUNTER — Ambulatory Visit (INDEPENDENT_AMBULATORY_CARE_PROVIDER_SITE_OTHER): Payer: Medicare HMO | Admitting: Obstetrics and Gynecology

## 2023-04-07 ENCOUNTER — Encounter: Payer: Self-pay | Admitting: Obstetrics and Gynecology

## 2023-04-07 VITALS — BP 138/72 | HR 55

## 2023-04-07 DIAGNOSIS — R159 Full incontinence of feces: Secondary | ICD-10-CM | POA: Diagnosis not present

## 2023-04-07 NOTE — Progress Notes (Signed)
[facility] Return Visit  SUBJECTIVE  History of Present Illness: [name] [name] is a 72 y.o. female seen in follow-up for bowel leakage.  She reports that she has been having increasing bowel leakage. Recently though, it stopped. Stools were loose previously but now more solid.   She is having bowel movements twice a day. She feels that she empties well. She was taking miralax but it stopped recently and maybe feels this was causing more loose stools.    s/p Anterior and posterior repair with perineorrhaphy, sacrospinous hysteropexy, cystoscopy on 08/18/22. She denies any prolapse recently.   Past Medical History: Patient  has a past medical history of CKD (chronic kidney disease), stage II, COPD (chronic obstructive pulmonary disease) (HCC), Coronary artery disease (2005), Diverticulosis of colon, Dyslipidemia, Full dentures, History of anemia, History of hepatitis C virus infection, Hypertension, OSA (obstructive sleep apnea), Osteoarthritis, PAD (peripheral artery disease) (HCC), Peripheral neuropathy, Prolapse of anterior vaginal wall, S/P primary angioplasty with coronary stent (05/2004), Type 2 diabetes mellitus treated with insulin (HCC), Uterovaginal prolapse, incomplete, and Wears glasses.   Past Surgical History: She  has a past surgical history that includes Carpal tunnel release (Right, 2005); Tonsillectomy; Appendectomy (2003); Breast excisional biopsy (Right); Coronary angioplasty with stent (05/24/2004); Cardiac catheterization (05/21/2004); Cardiac catheterization (04/20/2006); Colonoscopy with propofol (10/12/2019); Cataract extraction w/ intraocular lens implant (Bilateral, 2022); Anterior and posterior repair with sacrospinous fixation (N/A, 08/18/2022); and Cystoscopy (N/A, 08/18/2022).   Medications: She has a current medication list which includes the following prescription(s): amlodipine, vitamin c, aspirin ec, coq-10, dexcom g7 sensor, trulicity, estradiol,  gvoke hypopen 2-pack, glucosamine-chondroitin, glucose blood, insulin lispro, bd pen needle nano 2nd gen, magnesium oxide, neuriva, omeprazole, onetouch delica lancets 33g, pregabalin, rosuvastatin, trazodone, vitamin a, vitamin d (ergocalciferol), vitamin e, acetaminophen, ciclopirox, and nystatin.   Allergies: Patient is allergic to fish allergy and latex.   Social History: Patient  reports that she quit smoking about 9 years ago. Her smoking use included cigarettes. She started smoking about 39 years ago. She has a 15 pack-year smoking history. She has never used smokeless tobacco. She reports current alcohol use. She reports that she does not currently use drugs.      OBJECTIVE     Physical Exam: Vitals:   04/07/23 1007  BP: 138/72  Pulse: (!) 55   Gen: No apparent distress, A&O x 3.  Detailed Urogynecologic Evaluation:  Deferred.    ASSESSMENT AND PLAN    Ms. Naeve is a 72 y.o. with:  1. Incontinence of feces, unspecified fecal incontinence type    - Previously having bowel leakage which has now resolved after stopping miralax. This was an issue for her on her initial visit as well but started up again. - Recommended starting daily metamucil which will help prevent constipation and stools that are too loose.   Follow up as needed  Marguerita Beards, MD  Time spent: I spent 15 minutes dedicated to the care of this patient on the date of this encounter to include pre-visit review of records, face-to-face time with the patient  and post visit documentation.

## 2023-04-07 NOTE — Patient Instructions (Signed)
Accidental Bowel Leakage: Our goal is to achieve formed bowel movements daily or every-other-day without leakage.  You may need to try different combinations of the following options to find what works best for you.  Some management options include: Dietary changes (more leafy greens, vegetables and fruits; less processed foods) Fiber supplementation (Metamucil or something with psyllium as active ingredient)- take once a day to prevent bowel leakage.  Over-the-counter imodium (tablets or liquid) to help solidify the stool and prevent leakage of stool. If you get constipated you can use Miralax as needed to achieve bowel movements.

## 2023-04-14 ENCOUNTER — Telehealth: Payer: Self-pay | Admitting: Student-PharmD

## 2023-04-14 NOTE — Progress Notes (Signed)
[facility] PREP Weekly Session  Patient Details  Name: [name] [name] MRN: [id number] Date of Birth: 11/03/1950 Age: 72 y.o. PCP: [name], MD  Vitals:   04/13/23 1030  Weight: 166 lb 12.8 oz (75.7 kg)     [facility] Weekly seesion - 04/14/23 1200       [facility] "PREP" Location   [facility]PREP" Location [facility]      Weekly Session   Topic Discussed Calorie breakdown    Minutes exercised this week 145 minutes    Classes attended to date 100             Pam Jerral Bonito 04/14/2023, 12:21 PM

## 2023-04-14 NOTE — Progress Notes (Signed)
Updated spreadsheet from [employer]. Patient showed interest in applying for patient assistance in April 2024 but follow up needed. Previously on PAP [employer] for [place], so may qualify. Forwarding to PAP.  [name] [name]

## 2023-04-23 DIAGNOSIS — E1165 Type 2 diabetes mellitus with hyperglycemia: Secondary | ICD-10-CM | POA: Diagnosis not present

## 2023-04-30 DIAGNOSIS — E119 Type 2 diabetes mellitus without complications: Secondary | ICD-10-CM | POA: Diagnosis not present

## 2023-04-30 DIAGNOSIS — Z961 Presence of intraocular lens: Secondary | ICD-10-CM | POA: Diagnosis not present

## 2023-04-30 LAB — HM DIABETES EYE EXAM

## 2023-05-04 NOTE — Progress Notes (Signed)
[facility] PRE[facility] Evaluation  Patient Details  Name: [name] [name] MRN: [id number] Date of Birth: Sep 07, 1950 Age: 72 y.o. PCP: [name], MD  Vitals:   05/04/23 1000  BP: (!) 98/50  Pulse: 69  SpO2: 98%  Weight: 167 lb 6.4 oz (75.9 kg)     [facility] Eval - 05/04/23 1500       [facility] "PREP" Location   [facility]" Location [facility]      Referral    Referring Provider Allyne Gee    Reason for referral Hypertension;Diabetes    Program Start Date 01/19/23    Program End Date 04/27/23      Measurement   Waist Circumference 36 inches    Waist Circumference End Program 35 inches    Hip Circumference 38 inches    Hip Circumference End Program 40 inches    Body fat 40 percent      Information for Trainer   Goals Plans on continuing to exercise      Mobility and Daily Activities   I find it easy to walk up or down two or more flights of stairs. 2    I have no trouble taking out the trash. 4    I do housework such as vacuuming and dusting on my own without difficulty. 4    I can easily lift a gallon of milk (8lbs). 4    I can easily walk a mile. 4    I have no trouble reaching into high cupboards or reaching down to pick up something from the floor. 3    I do not have trouble doing out-door work such as Loss adjuster, chartered, raking leaves, or gardening. 4      Mobility and Daily Activities   I feel younger than my age. 4    I feel independent. 4    I feel energetic. 4    I live an active life.  4    I feel strong. 3    I feel healthy. 3    I feel active as other people my age. 4      How fit and strong are you.   Fit and Strong Total Score 51            Past Medical History:  Diagnosis Date   CKD (chronic kidney disease), stage II    COPD (chronic obstructive pulmonary disease) (HCC)    Coronary artery disease 2005   cardiologist--- dr [place] Lions;    cardiac cath for acute angina 05-21-2004 multivessel disease;  cath after GI consult 12/ 2005  PCTA  w/ DES to prox RCA ;   cath 04-20-2006 patent stent with nonobstruction CAD;  NUC 12/ 2013 normal no ischemia;  last ETT 07-30-2022 no ischemia   Diverticulosis of colon    Dyslipidemia    Full dentures    History of anemia    History of hepatitis C virus infection    per pt was dx many yrs ago and treated in 2017  (noted in epic HCV RNA quan not detectable 10-25-2019)   Hypertension    OSA (obstructive sleep apnea)    study in epic 03-06-2019  moderate osa , (08-14-2022  pt stated she tried  cpap  but just decided to stop using it)   Osteoarthritis    PAD (peripheral artery disease) (HCC)    Peripheral neuropathy    Prolapse of anterior vaginal wall    S/P primary angioplasty with coronary stent 05/2004   PTCA and DES x1  to pRCA   Type 2 diabetes mellitus treated with insulin (HCC)    followed by pcp   (08-14-2022  per pt checks blood sugar multiple times dialy w/ dexcom 7,  fasting average 101)   Uterovaginal prolapse, incomplete    Wears glasses    Past Surgical History:  Procedure Laterality Date   ANTERIOR AND POSTERIOR REPAIR WITH SACROSPINOUS FIXATION N/A 08/18/2022   Procedure: ANTERIOR AND POSTERIOR REPAIR, PERINEORRHAPHY WITH SACROSPINOUS FIXATION;  Surgeon: Marguerita Beards, MD;  Location: [facility];  Service: Gynecology;  Laterality: N/A;  Total time requested is 1.5hrs   APPENDECTOMY  2003   BREAST EXCISIONAL BIOPSY Right    age 25s;   benign   CARDIAC CATHETERIZATION  05/21/2004   @[facility]  by dr s. Diona Browner;   acute angina;   multivessel disease;   pRCA 80%,  dRCA 50-60%,  dLM 40%,  ostialLAD 50-60%,  pCfx 30%,  LVEF 60%   CARDIAC CATHETERIZATION  04/20/2006   @[facility]  by dr Lacretia Nicks. Samule Ohm;  angina;  patent RCA stent,  minimal nonobstructive coronary disease   CARPAL TUNNEL RELEASE Right 2005   CATARACT EXTRACTION W/ INTRAOCULAR LENS IMPLANT Bilateral 2022   COLONOSCOPY WITH PROPOFOL  10/12/2019   dr Marca Ancona   CORONARY ANGIOPLASTY WITH STENT PLACEMENT  05/24/2004   @[facility]  by dr Lacretia Nicks.  Samule Ohm;  after GI consult for bleed;   PTCA and DES x1  pRCA   CYSTOSCOPY N/A 08/18/2022   Procedure: CYSTOSCOPY;  Surgeon: Marguerita Beards, MD;  Location: [facility];  Service: Gynecology;  Laterality: N/A;   TONSILLECTOMY     yrs ago   Social History   Tobacco Use  Smoking Status Former   Current packs/day: 0.00   Average packs/day: 0.5 packs/day for 30.0 years (15.0 ttl pk-yrs)   Types: Cigarettes   Start date: 12/21/1983   Quit date: 12/20/2013   Years since quitting: 9.3  Smokeless Tobacco Never  Attended 13 sessions, 7 educational Fit testing: Cardio march: 207 to 294 Sit to stand: 14 to 13 Bicep curls: 12 to 20 Encouraged to continue to exercise and do work on balance.   [name] 05/04/2023, 3:30 PM

## 2023-05-06 ENCOUNTER — Ambulatory Visit: Payer: Self-pay | Admitting: Internal Medicine

## 2023-05-07 ENCOUNTER — Other Ambulatory Visit: Payer: Self-pay | Admitting: Internal Medicine

## 2023-05-27 ENCOUNTER — Ambulatory Visit (INDEPENDENT_AMBULATORY_CARE_PROVIDER_SITE_OTHER): Payer: Medicare HMO | Admitting: Podiatry

## 2023-05-27 ENCOUNTER — Ambulatory Visit (INDEPENDENT_AMBULATORY_CARE_PROVIDER_SITE_OTHER): Payer: Medicare HMO

## 2023-05-27 DIAGNOSIS — E119 Type 2 diabetes mellitus without complications: Secondary | ICD-10-CM

## 2023-05-27 DIAGNOSIS — M79674 Pain in right toe(s): Secondary | ICD-10-CM | POA: Diagnosis not present

## 2023-05-27 DIAGNOSIS — M79672 Pain in left foot: Secondary | ICD-10-CM | POA: Diagnosis not present

## 2023-05-27 DIAGNOSIS — B351 Tinea unguium: Secondary | ICD-10-CM | POA: Diagnosis not present

## 2023-05-27 DIAGNOSIS — M79675 Pain in left toe(s): Secondary | ICD-10-CM

## 2023-05-27 DIAGNOSIS — M722 Plantar fascial fibromatosis: Secondary | ICD-10-CM | POA: Diagnosis not present

## 2023-05-27 MED ORDER — MELOXICAM 15 MG PO TABS: 15.0000 mg | ORAL_TABLET | Freq: Every day | ORAL | 0 refills | Status: DC

## 2023-05-27 NOTE — Patient Instructions (Signed)
.  tfplease

## 2023-05-27 NOTE — Progress Notes (Signed)
Subjective:  Patient ID: [name] [name], female    DOB: 1950/11/23,   MRN: [id number]  No chief complaint on file.   72 y.o. female presents for concern of thickened elongated and painful nails that are difficult to trim. Requesting to have them trimmed today. Relates burning and tingling in their feet. Patient is diabetic and last A1c was  Lab Results  Component Value Date   HGBA1C 7.1 (H) 01/08/2023   .   Also relates concern of left heel pain that has been ongoing for a couple weeks after a catering event. Relates first steps in left heel have been very painful. She has tried some pain cream which helps but denies any other treatments.   PCP:  [name], MD    . Denies any other pedal complaints. Denies n/v/f/c.   Past Medical History:  Diagnosis Date   CKD (chronic kidney disease), stage II    COPD (chronic obstructive pulmonary disease) (HCC)    Coronary artery disease 2005   cardiologist--- dr [place] Lions;    cardiac cath for acute angina 05-21-2004 multivessel disease;  cath after GI consult 12/ 2005  PCTA  w/ DES to prox RCA ;  cath 04-20-2006 patent stent with nonobstruction CAD;  NUC 12/ 2013 normal no ischemia;  last ETT 07-30-2022 no ischemia   Diverticulosis of colon    Dyslipidemia    Full dentures    History of anemia    History of hepatitis C virus infection    per pt was dx many yrs ago and treated in 2017  (noted in epic HCV RNA quan not detectable 10-25-2019)   Hypertension    OSA (obstructive sleep apnea)    study in epic 03-06-2019  moderate osa , (08-14-2022  pt stated she tried  cpap  but just decided to stop using it)   Osteoarthritis    PAD (peripheral artery disease) (HCC)    Peripheral neuropathy    Prolapse of anterior vaginal wall    S/P primary angioplasty with coronary stent 05/2004   PTCA and DES x1 to pRCA   Type 2 diabetes mellitus treated with insulin (HCC)    followed by pcp   (08-14-2022  per pt checks blood sugar multiple times dialy w/  dexcom 7,  fasting average 101)   Uterovaginal prolapse, incomplete    Wears glasses     Objective:  Physical Exam: Vascular: DP/PT pulses 2/4 bilateral. CFT <3 seconds. Absent hair growth on digits. Edema noted to bilateral lower extremities. Xerosis noted bilaterally.  Skin. No lacerations or abrasions bilateral feet. Nails 1-5 bilateral  are thickened discolored and elongated with subungual debris.  Musculoskeletal: MMT 5/5 bilateral lower extremities in DF, PF, Inversion and Eversion. Deceased ROM in DF of ankle joint. Tender to the medial calcaneal tubercle left . No pain with achilles, PT or arch. No pain with calcaneal squeeze.  Neurological: Sensation intact to light touch. Protective sensation diminished bilateral.    Assessment:   1. Plantar fasciitis of left foot   2. Pain due to onychomycosis of toenails of both feet   3. Porokeratosis   4. Diabetes mellitus without complication (HCC)      Plan:  Patient was evaluated and treated and all questions answered. -Discussed and educated patient on diabetic foot care, especially with  regards to the vascular, neurological and musculoskeletal systems.  -Stressed the importance of good glycemic control and the detriment of not  controlling glucose levels in relation to the foot. -Discussed supportive shoes at  all times and checking feet regularly.  -Mechanically debrided all nails 1-5 bilateral using sterile nail nipper and filed with dremel without incident  -Answered all patient questions Discussed plantar fasciitis with patient.  X-rays reviewed and discussed with patient. No acute fractures or dislocations noted. Mild spurring noted at inferior calcaneus.  Discussed treatment options including, ice, NSAIDS, supportive shoes, bracing, and stretching. Stretching exercises provided to be done on a daily basis.   Prescription for meloxicam provided and sent to pharmacy. PF brace dispensed.  Follow-up 6 weeks or sooner if any  problems arise. In the meantime, encouraged to call the office with any questions, concerns, change in symptoms.        Louann Sjogren, DPM

## 2023-05-28 ENCOUNTER — Encounter: Payer: Self-pay | Admitting: Internal Medicine

## 2023-05-28 ENCOUNTER — Ambulatory Visit: Payer: Medicare HMO | Admitting: Internal Medicine

## 2023-05-28 ENCOUNTER — Ambulatory Visit: Payer: Medicare HMO

## 2023-05-28 VITALS — BP 128/80 | HR 70 | Temp 98.1°F | Ht 64.0 in | Wt 170.0 lb

## 2023-05-28 VITALS — BP 128/80 | HR 70 | Temp 98.1°F | Ht 64.2 in | Wt 170.2 lb

## 2023-05-28 DIAGNOSIS — K219 Gastro-esophageal reflux disease without esophagitis: Secondary | ICD-10-CM

## 2023-05-28 DIAGNOSIS — I700 Atherosclerosis of aorta: Secondary | ICD-10-CM

## 2023-05-28 DIAGNOSIS — E7800 Pure hypercholesterolemia, unspecified: Secondary | ICD-10-CM

## 2023-05-28 DIAGNOSIS — I1310 Hypertensive heart and chronic kidney disease without heart failure, with stage 1 through stage 4 chronic kidney disease, or unspecified chronic kidney disease: Secondary | ICD-10-CM | POA: Diagnosis not present

## 2023-05-28 DIAGNOSIS — N182 Chronic kidney disease, stage 2 (mild): Secondary | ICD-10-CM

## 2023-05-28 DIAGNOSIS — Z79899 Other long term (current) drug therapy: Secondary | ICD-10-CM

## 2023-05-28 DIAGNOSIS — Z0000 Encounter for general adult medical examination without abnormal findings: Secondary | ICD-10-CM

## 2023-05-28 DIAGNOSIS — E2839 Other primary ovarian failure: Secondary | ICD-10-CM

## 2023-05-28 DIAGNOSIS — E1122 Type 2 diabetes mellitus with diabetic chronic kidney disease: Secondary | ICD-10-CM

## 2023-05-28 DIAGNOSIS — Z794 Long term (current) use of insulin: Secondary | ICD-10-CM | POA: Diagnosis not present

## 2023-05-28 MED ORDER — OMEPRAZOLE 20 MG PO CPDR: DELAYED_RELEASE_CAPSULE | ORAL | 1 refills | Status: DC

## 2023-05-28 NOTE — Patient Instructions (Addendum)
Please increase omeprazole to twice daily, do this for two weeks and then go back to once daily  Type 2 Diabetes Mellitus, Diagnosis, Adult Type 2 diabetes (type 2 diabetes mellitus) is a long-term (chronic) disease. It may happen when there is one or both of these problems: The pancreas does not make enough insulin. The body does not react in a normal way to insulin that it makes. Insulin lets sugars go into cells in your body. If you have type 2 diabetes, sugars cannot get into your cells. Sugars build up in the blood. This causes high blood sugar. What are the causes? The exact cause of this condition is not known. What increases the risk? Having type 2 diabetes in your family. Being overweight or very overweight. Not being active. Your body not reacting in a normal way to the insulin it makes. Having higher than normal blood sugar over time. Having a type of diabetes when you were pregnant. Having a condition that causes small fluid-filled sacs on your ovaries. What are the signs or symptoms? At first, you may have no symptoms. You will get symptoms slowly. They may include: More thirst than normal. More hunger than normal. Needing to pee more than normal. Losing weight without trying. Feeling tired. Feeling weak. Seeing things blurry. Dark patches on your skin. How is this treated? This condition may be treated by a diabetes expert. You may need to: Follow an eating plan made by a food expert (dietitian). Get regular exercise. Find ways to deal with stress. Check blood sugar as often as told. Take medicines. Your doctor will set treatment goals for you. Your blood sugar should be at these levels: Before meals: 80-130 mg/dL (2.9-5.2 mmol/L). After meals: below 180 mg/dL (10 mmol/L). Over the last 2-3 months: less than 7%. Follow these instructions at home: Medicines Take your diabetes medicines or insulin every day. Take medicines as told to help you prevent other  problems caused by this condition. You may need: Aspirin. Medicine to lower cholesterol. Medicine to control blood pressure. Questions to ask your doctor Should I meet with a diabetes educator? What medicines do I need, and when should I take them? What will I need to treat my condition at home? When should I check my blood sugar? Where can I find a support group? Who can I call if I have questions? When is my next doctor visit? General instructions Take over-the-counter and prescription medicines only as told by your doctor. Keep all follow-up visits. Where to find more information For help and guidance and more information about diabetes, please go to: American Diabetes Association (ADA): www.diabetes.org American Association of Diabetes Care and Education Specialists (ADCES): www.diabeteseducator.org International Diabetes Federation (IDF): DCOnly.dk Contact a doctor if: Your blood sugar is at or above 240 mg/dL (84.1 mmol/L) for 2 days in a row. You have been sick for 2 days or more, and you are not getting better. You have had a fever for 2 days or more, and you are not getting better. You have any of these problems for more than 6 hours: You cannot eat or drink. You feel like you may vomit. You vomit. You have watery poop (diarrhea). Get help right away if: Your blood sugar is lower than 54 mg/dL (3 mmol/L). You feel mixed up (confused). You have trouble thinking clearly. You have trouble breathing. You have medium or large ketone levels in your pee. These symptoms may be an emergency. Get help right away. Call your local emergency services (  911 in the [place] Do not wait to see if the symptoms will go away. Do not drive yourself to the hospital. Summary Type 2 diabetes is a long-term disease. Your pancreas may not make enough insulin, or your body may not react in a normal way to insulin that it makes. This condition is treated with an eating plan, lifestyle changes,  and medicines. Your doctor will set treatment goals for you. These will help you keep your blood sugar in a healthy range. Keep all follow-up visits. This information is not intended to replace advice given to you by your health care provider. Make sure you discuss any questions you have with your health care provider. Document Revised: 09/03/2020 Document Reviewed: 09/03/2020 Elsevier Patient Education  2024 [employer].

## 2023-05-28 NOTE — Progress Notes (Signed)
Subjective:   [name] [name] is a 72 y.o. female who presents for Medicare Annual (Subsequent) preventive examination.  Visit Complete: In person    Cardiac Risk Factors include: advanced age (>63men, >38 women);diabetes mellitus;dyslipidemia;hypertension     Objective:    Today's Vitals   05/28/23 0900 05/28/23 0901  BP: 128/80   Pulse: 70   Temp: 98.1 F (36.7 C)   TempSrc: Oral   SpO2: 97%   Weight: 170 lb 3.2 oz (77.2 kg)   Height: 5' 4.2" (1.631 m)   PainSc:  5    Body mass index is 29.03 kg/m.     05/28/2023    9:06 AM 08/18/2022   10:56 AM 04/30/2022    2:27 PM 06/03/2021   12:02 PM 03/21/2021    9:26 AM 03/29/2020    9:56 AM 01/26/2020   11:37 AM  Advanced Directives  Does Patient Have a Medical Advance Directive? No No No No No No No  Would patient like information on creating a medical advance directive? No - Patient declined Yes (Inpatient - patient defers creating a medical advance directive at this time - Information given) Yes (MAU/Ambulatory/Procedural Areas - Information given) No - Patient declined No - Patient declined No - Patient declined No - Patient declined    Current Medications (verified) Outpatient Encounter Medications as of 05/28/2023  Medication Sig   amLODipine (NORVASC) 10 MG tablet TAKE 1 TABLET(10 MG) BY MOUTH DAILY   Ascorbic Acid (VITAMIN C) 500 MG CHEW Chew by mouth daily.   aspirin EC 81 MG tablet Take 81 mg by mouth daily. Swallow whole.   Coenzyme Q10 (COQ-10) 100 MG CAPS Take 1 tablet by mouth daily.    Continuous Blood Gluc Sensor (DEXCOM G7 SENSOR) MISC Use to check blood sugar 3 times a day. Dx code e11.65   Dulaglutide (TRULICITY) 1.5 MG/0.5ML SOPN Inject 1.5 mg into the skin once a week. Tuesday   estradiol (ESTRACE) 0.1 MG/GM vaginal cream Place 0.5 g vaginally 2 (two) times a week. Place 0.5g nightly for two weeks then twice a week after   Glucagon (GVOKE HYPOPEN 2-PACK) 0.5 MG/0.1ML SOAJ Use for blood sugars less than 60.    glucosamine-chondroitin 500-400 MG tablet Take 1 tablet by mouth daily.   glucose blood (ONETOUCH VERIO) test strip CHECK BLOOD SUGAR THREE TIMES DAILY dx: e11.65   insulin lispro (HUMALOG KWIKPEN) 100 UNIT/ML KwikPen Inject subcutaneously 2 units if sugars are 150 - 199, 4 units 200 -249, 6 units 250- 299, 8 units 300 - 349, 10 units >350   Insulin Pen Needle (BD PEN NEEDLE NANO 2ND GEN) 32G X 4 MM MISC USE TO INJECT INSULIN DAILY   MAGNESIUM OXIDE PO Take 400 mg by mouth at bedtime.   meloxicam (MOBIC) 15 MG tablet Take 1 tablet (15 mg total) by mouth daily.   Misc Natural Products (NEURIVA) CAPS Take 1 capsule by mouth daily.   omeprazole (PRILOSEC) 20 MG capsule TAKE 1 CAPSULE BY MOUTH DAILY   OneTouch Delica Lancets 33G MISC USE AS DIRECTED TO CHECK BLOOD SUGAR THREE TIMES DAILY   pregabalin (LYRICA) 50 MG capsule Take 1 capsule (50 mg total) by mouth at bedtime. TAKE 1 CAPSULE(50 MG) BY MOUTH AT BEDTIME   rosuvastatin (CRESTOR) 20 MG tablet TAKE 1 TABLET(20 MG) BY MOUTH DAILY   traZODone (DESYREL) 50 MG tablet TAKE 1 TABLET BY MOUTH EVERY NIGHT AT BEDTIME   Vitamin A 3 MG (10000 UT) TABS Take 1 tablet by mouth daily.  2,400 mcg   Vitamin D, Ergocalciferol, (DRISDOL) 1.25 MG (50000 UNIT) CAPS capsule TAKE 1 CAPSULE BY MOUTH EVERY 7 DAYS   vitamin E 600 UNIT capsule Take 400 Units by mouth at bedtime.   acetaminophen (TYLENOL) 500 MG tablet Take 1 tablet (500 mg total) by mouth every 6 (six) hours as needed (pain). (Patient not taking: Reported on 04/07/2023)   ciclopirox (PENLAC) 8 % solution Apply one coat to toenail qd.  Remove weekly with polish remover. (Patient not taking: Reported on 04/07/2023)   nystatin (NYSTATIN) powder Apply topically 3 (three) times daily. As needed (Patient not taking: Reported on 04/07/2023)   No facility-administered encounter medications on file as of 05/28/2023.    Allergies (verified) Fish allergy and Latex   History: Past Medical History:   Diagnosis Date   CKD (chronic kidney disease), stage II    COPD (chronic obstructive pulmonary disease) (HCC)    Coronary artery disease 2005   cardiologist--- dr LaSalle Lions;    cardiac cath for acute angina 05-21-2004 multivessel disease;  cath after GI consult 12/ 2005  PCTA  w/ DES to prox RCA ;  cath 04-20-2006 patent stent with nonobstruction CAD;  NUC 12/ 2013 normal no ischemia;  last ETT 07-30-2022 no ischemia   Diverticulosis of colon    Dyslipidemia    Full dentures    History of anemia    History of hepatitis C virus infection    per pt was dx many yrs ago and treated in 2017  (noted in epic HCV RNA quan not detectable 10-25-2019)   Hypertension    OSA (obstructive sleep apnea)    study in epic 03-06-2019  moderate osa , (08-14-2022  pt stated she tried  cpap  but just decided to stop using it)   Osteoarthritis    PAD (peripheral artery disease) (HCC)    Peripheral neuropathy    Prolapse of anterior vaginal wall    S/P primary angioplasty with coronary stent 05/2004   PTCA and DES x1 to pRCA   Type 2 diabetes mellitus treated with insulin (HCC)    followed by pcp   (08-14-2022  per pt checks blood sugar multiple times dialy w/ dexcom 7,  fasting average 101)   Uterovaginal prolapse, incomplete    Wears glasses    Past Surgical History:  Procedure Laterality Date   ANTERIOR AND POSTERIOR REPAIR WITH SACROSPINOUS FIXATION N/A 08/18/2022   Procedure: ANTERIOR AND POSTERIOR REPAIR, PERINEORRHAPHY WITH SACROSPINOUS FIXATION;  Surgeon: Marguerita Beards, MD;  Location: [facility];  Service: Gynecology;  Laterality: N/A;  Total time requested is 1.5hrs   APPENDECTOMY  2003   BREAST EXCISIONAL BIOPSY Right    age 57s;   benign   CARDIAC CATHETERIZATION  05/21/2004   @[facility]  by dr s. Diona Browner;   acute angina;   multivessel disease;   pRCA 80%,  dRCA 50-60%,  dLM 40%,  ostialLAD 50-60%,  pCfx 30%,  LVEF 60%   CARDIAC CATHETERIZATION  04/20/2006   @[facility]  by dr Lacretia Nicks.  Samule Ohm;  angina;  patent RCA stent,  minimal nonobstructive coronary disease   CARPAL TUNNEL RELEASE Right 2005   CATARACT EXTRACTION W/ INTRAOCULAR LENS IMPLANT Bilateral 2022   COLONOSCOPY WITH PROPOFOL  10/12/2019   dr Marca Ancona   CORONARY ANGIOPLASTY WITH STENT PLACEMENT  05/24/2004   @[facility]  by dr Lacretia Nicks. Samule Ohm;  after GI consult for bleed;   PTCA and DES x1  pRCA   CYSTOSCOPY N/A 08/18/2022   Procedure: CYSTOSCOPY;  Surgeon: Marguerita Beards, MD;  Location: [facility];  Service: Gynecology;  Laterality: N/A;   TONSILLECTOMY     yrs ago   Family History  Problem Relation Age of Onset   CAD Father 66   Heart attack Father    CAD Mother 12   Heart attack Mother    CAD Brother    Cancer Brother        prostate   CAD Sister    Cancer Sister        breast   CAD Brother    Social History   Socioeconomic History   Marital status: Single    Spouse name: Not on file   Number of children: 2   Years of education: Not on file   Highest education level: Some college, no degree  Occupational History   Occupation: retired    Comment: disabled  Tobacco Use   Smoking status: Former    Current packs/day: 0.00    Average packs/day: 0.5 packs/day for 30.0 years (15.0 ttl pk-yrs)    Types: Cigarettes    Start date: 12/21/1983    Quit date: 12/20/2013    Years since quitting: 9.4   Smokeless tobacco: Never  Vaping Use   Vaping status: Never Used  Substance and Sexual Activity   Alcohol use: Not Currently    Comment: rarely   Drug use: Not Currently    Comment: quit 2013   Sexual activity: Not Currently    Birth control/protection: Post-menopausal  Other Topics Concern   Not on file  Social History Narrative   Lives alone.   One son died in a car accident.     Coffee in am ,1 cup   Social Determinants of Health   Financial Resource Strain: Low Risk  (05/28/2023)   Overall Financial Resource Strain (CARDIA)    Difficulty of Paying Living Expenses: Not hard at all   Food Insecurity: No Food Insecurity (05/28/2023)   Hunger Vital Sign    Worried About Running Out of Food in the Last Year: Never true    Ran Out of Food in the Last Year: Never true  Transportation Needs: No Transportation Needs (05/28/2023)   PRAPARE - Administrator, Civil Service (Medical): No    Lack of Transportation (Non-Medical): No  Physical Activity: Insufficiently Active (05/28/2023)   Exercise Vital Sign    Days of Exercise per Week: 2 days    Minutes of Exercise per Session: 60 min  Stress: No Stress Concern Present (05/28/2023)   [employer] of Occupational Health - Occupational Stress Questionnaire    Feeling of Stress : Not at all  Social Connections: Moderately Integrated (05/28/2023)   Social Connection and Isolation Panel [NHANES]    Frequency of Communication with Friends and Family: More than three times a week    Frequency of Social Gatherings with Friends and Family: Once a week    Attends Religious Services: More than 4 times per year    Active Member of [employer] or Organizations: Yes    Attends Engineer, structural: More than 4 times per year    Marital Status: Never married    Tobacco Counseling Counseling given: Not Answered   Clinical Intake:  Pre-visit preparation completed: Yes  Pain : 0-10 Pain Score: 5  Pain Type: Chronic pain Pain Location: Foot Pain Orientation: Left Pain Descriptors / Indicators: Aching Pain Onset: More than a month ago Pain Frequency: Constant     Nutritional Status: BMI  25 -29 Overweight Nutritional Risks: None Diabetes: Yes CBG done?: No Did pt. bring in CBG monitor from home?: No  How often do you need to have someone help you when you read instructions, pamphlets, or other written materials from your doctor or pharmacy?: 1 - Never  Interpreter Needed?: No  Information entered by :: NAllen LPN   Activities of Daily Living    05/28/2023    9:02 AM 08/18/2022   11:11 AM  In your  present state of health, do you have any difficulty performing the following activities:  Hearing? 0 0  Vision? 1 0  Comment getting new glasses   Difficulty concentrating or making decisions? 0 0  Walking or climbing stairs? 0 0  Dressing or bathing? 0   Doing errands, shopping? 0   Preparing Food and eating ? N   Using the Toilet? N   In the past six months, have you accidently leaked urine? N   Do you have problems with loss of bowel control? N   Managing your Medications? N   Managing your Finances? N   Housekeeping or managing your Housekeeping? N     Patient Care Team: [name], MD as PCP - General (Internal Medicine) Rollene Rotunda, MD as PCP - Cardiology (Cardiology) [facility]  Indicate any recent Medical Services you may have received from other than [facility] providers in the past year (date may be approximate).     Assessment:   This is a routine wellness examination for [place].  Hearing/Vision screen Hearing Screening - Comments:: Denies hearing issues Vision Screening - Comments:: Regular eye exams, [facility]   Goals Addressed             This Visit's Progress    Patient Stated       05/28/2023, tighten up muscles       Depression Screen    05/28/2023    9:07 AM 03/03/2023    2:09 PM 01/08/2023    9:55 AM 09/02/2022    9:33 AM 04/30/2022    2:28 PM 01/08/2022    4:33 PM 08/22/2021    9:53 AM  PHQ 2/9 Scores  PHQ - 2 Score 0 0 0 0 0 0 0  PHQ- 9 Score  0 0        Fall Risk    05/28/2023    9:07 AM 03/03/2023    2:09 PM 01/08/2023    9:55 AM 09/02/2022    9:33 AM 04/30/2022    2:27 PM  Fall Risk   Falls in the past year? 0 0 0 0 0  Number falls in past yr: 0 0 0 0 0  Injury with Fall? 0 0 0 0 0  Risk for fall due to : Medication side effect No Fall Risks No Fall Risks No Fall Risks Medication side effect  Follow up Falls prevention discussed;Falls evaluation completed Falls evaluation completed Falls evaluation completed  Falls evaluation completed Falls prevention discussed;Education provided;Falls evaluation completed    MEDICARE RISK AT HOME: Medicare Risk at Home Any stairs in or around the home?: Yes If so, are there any without handrails?: No Home free of loose throw rugs in walkways, pet beds, electrical cords, etc?: Yes Adequate lighting in your home to reduce risk of falls?: Yes Life alert?: No Use of a cane, walker or w/c?: No Grab bars in the bathroom?: Yes Shower chair or bench in shower?: Yes Elevated toilet seat or a handicapped toilet?: Yes  TIMED  UP AND GO:  Was the test performed?  Yes  Length of time to ambulate 10 feet: 5 sec Gait steady and fast without use of assistive device    Cognitive Function:        05/28/2023    9:08 AM 04/30/2022    2:29 PM 03/21/2021    9:29 AM 03/29/2020   10:00 AM 01/26/2019    9:20 AM  6CIT Screen  What Year? 0 points 0 points 0 points 0 points 0 points  What month? 0 points 0 points 0 points 0 points 0 points  What time? 0 points 0 points 0 points 0 points 0 points  Count back from 20 0 points 0 points 0 points 0 points 0 points  Months in reverse 0 points 0 points 0 points 0 points 0 points  Repeat phrase 0 points 2 points 0 points 2 points 0 points  Total Score 0 points 2 points 0 points 2 points 0 points    Immunizations Immunization History  Administered Date(s) Administered   Fluad Quad(high Dose 65+) 03/04/2019, 03/10/2022, 03/09/2023   Influenza, High Dose Seasonal PF 03/21/2017, 03/11/2018   Influenza-Unspecified 03/07/2020, 03/13/2021   PFIZER(Purple Top)SARS-COV-2 Vaccination 07/30/2019, 08/20/2019, 04/16/2020, 11/23/2020, 05/01/2021   PNEUMOCOCCAL CONJUGATE-20 04/27/2021   Pneumococcal Conjugate-13 10/23/2016   Tdap 12/15/2014   Unspecified SARS-COV-2 Vaccination 03/10/2022, 03/09/2023   Zoster Recombinant(Shingrix) 01/25/2019, 03/30/2019    TDAP status: Up to date  Flu Vaccine status: Up to date  Pneumococcal vaccine  status: Up to date  Covid-19 vaccine status: Completed vaccines  Qualifies for Shingles Vaccine? Yes   Zostavax completed Yes   Shingrix Completed?: Yes  Screening Tests Health Maintenance  Topic Date Due   Diabetic kidney evaluation - Urine ACR  05/01/2023   COVID-19 Vaccine (8 - 2023-24 season) 05/04/2023   FOOT EXAM  07/03/2023   HEMOGLOBIN A1C  07/11/2023   Diabetic kidney evaluation - eGFR measurement  01/08/2024   OPHTHALMOLOGY EXAM  04/29/2024   Medicare Annual Wellness (AWV)  05/27/2024   DTaP/Tdap/Td (2 - Td or Tdap) 12/14/2024   MAMMOGRAM  02/03/2025   Colonoscopy  10/11/2029   Pneumonia Vaccine 61+ Years old  Completed   INFLUENZA VACCINE  Completed   DEXA SCAN  Completed   Hepatitis C Screening  Completed   Zoster Vaccines- Shingrix  Completed   HPV VACCINES  Aged Out    Health Maintenance  Health Maintenance Due  Topic Date Due   Diabetic kidney evaluation - Urine ACR  05/01/2023   COVID-19 Vaccine (8 - 2023-24 season) 05/04/2023    Colorectal cancer screening: Type of screening: Colonoscopy. Completed 10/12/2019. Repeat every 10 years  Mammogram status: Completed 02/04/2023. Repeat every year  Bone Density status: Completed 05/28/2010.   Lung Cancer Screening: (Low Dose CT Chest recommended if Age 1-80 years, 20 pack-year currently smoking OR have quit w/in 15years.) does not qualify.   Lung Cancer Screening Referral: no  Additional Screening:  Hepatitis C Screening: does qualify; Completed 10/25/2019  Vision Screening: Recommended annual ophthalmology exams for early detection of glaucoma and other disorders of the eye. Is the patient up to date with their annual eye exam?  Yes  Who is the provider or what is the name of the office in which the patient attends annual eye exams? [facility] If pt is not established with a provider, would they like to be referred to a provider to establish care? No .   Dental Screening: Recommended annual dental  exams for  proper oral hygiene  Diabetic Foot Exam: Diabetic Foot Exam: Completed 07/02/2022  Community Resource Referral / Chronic Care Management: CRR required this visit?  No   CCM required this visit?  No     Plan:     I have personally reviewed and noted the following in the patient's chart:   Medical and social history Use of alcohol, tobacco or illicit drugs  Current medications and supplements including opioid prescriptions. Patient is not currently taking opioid prescriptions. Functional ability and status Nutritional status Physical activity Advanced directives List of other physicians Hospitalizations, surgeries, and ER visits in previous 12 months Vitals Screenings to include cognitive, depression, and falls Referrals and appointments  In addition, I have reviewed and discussed with patient certain preventive protocols, quality metrics, and best practice recommendations. A written personalized care plan for preventive services as well as general preventive health recommendations were provided to patient.     Barb Merino, LPN   [id number]/[id number]   After Visit Summary: (In Person-Printed) AVS printed and given to the patient  Nurse Notes: none

## 2023-05-28 NOTE — Progress Notes (Signed)
I,[name] [name], CMA,acting as a Neurosurgeon for [name], MD.,have documented all relevant documentation on the behalf of [name], MD,as directed by  [name], MD while in the presence of [name], MD.  Subjective:  Patient ID: [name] [name] , female    DOB: 17-Apr-1951 , 72 y.o.   MRN: [id number]  Chief Complaint  Patient presents with   Diabetes   Hypertension   Hyperlipidemia    HPI  Patient presents today for a diabetes, HTN & cholesterol check. Patient reports compliance with her meds. She denies headaches, chest pain and shortness of breath.  She has no specific concerns at this time.       Diabetes She presents for her follow-up diabetic visit. She has type 2 diabetes mellitus. Her disease course has been improving. Pertinent negatives for hypoglycemia include no dizziness or headaches. Pertinent negatives for diabetes include no blurred vision, no chest pain, no polydipsia, no polyphagia and no polyuria. There are no hypoglycemic complications. Risk factors for coronary artery disease include diabetes mellitus, dyslipidemia, hypertension and post-menopausal. Current diabetic treatment includes insulin injections. She is compliant with treatment most of the time. She is following a diabetic diet. She participates in exercise intermittently. Eye exam is current.  Hypertension This is a chronic problem. The current episode started more than 1 year ago. The problem has been gradually improving since onset. The problem is controlled. Pertinent negatives include no blurred vision, chest pain, headaches or palpitations. Past treatments include angiotensin blockers. The current treatment provides moderate improvement. Compliance problems include exercise and diet.  Hypertensive end-organ damage includes kidney disease. Identifiable causes of hypertension include chronic renal disease.  Hyperlipidemia This is a chronic problem. The current episode started more  than 1 year ago. Exacerbating diseases include chronic renal disease. Pertinent negatives include no chest pain. Current antihyperlipidemic treatment includes statins. The current treatment provides moderate improvement of lipids. Risk factors for coronary artery disease include diabetes mellitus, dyslipidemia, hypertension, post-menopausal and a sedentary lifestyle.     Past Medical History:  Diagnosis Date   CKD (chronic kidney disease), stage II    COPD (chronic obstructive pulmonary disease) (HCC)    Coronary artery disease 2005   cardiologist--- dr [place];    cardiac cath for acute angina 05-21-2004 multivessel disease;  cath after GI consult 12/ 2005  PCTA  w/ DES to prox RCA ;  cath 04-20-2006 patent stent with nonobstruction CAD;  NUC 12/ 2013 normal no ischemia;  last ETT 07-30-2022 no ischemia   Diverticulosis of colon    Dyslipidemia    Full dentures    History of anemia    History of hepatitis C virus infection    per pt was dx many yrs ago and treated in 2017  (noted in epic HCV RNA quan not detectable 10-25-2019)   Hypertension    OSA (obstructive sleep apnea)    study in epic 03-06-2019  moderate osa , (08-14-2022  pt stated she tried  cpap  but just decided to stop using it)   Osteoarthritis    PAD (peripheral artery disease) (HCC)    Peripheral neuropathy    Prolapse of anterior vaginal wall    S/P primary angioplasty with coronary stent 05/2004   PTCA and DES x1 to pRCA   Type 2 diabetes mellitus treated with insulin (HCC)    followed by pcp   (08-14-2022  per pt checks blood sugar multiple times dialy w/ dexcom 7,  fasting average 101)  Uterovaginal prolapse, incomplete    Wears glasses      Family History  Problem Relation Age of Onset   CAD Father 72   Heart attack Father    CAD Mother 38   Heart attack Mother    CAD Brother    Cancer Brother        prostate   CAD Sister    Cancer Sister        breast   CAD Brother      Current Outpatient  Medications:    acetaminophen (TYLENOL) 500 MG tablet, Take 1 tablet (500 mg total) by mouth every 6 (six) hours as needed (pain). (Patient not taking: Reported on 04/07/2023), Disp: 30 tablet, Rfl: 0   amLODipine (NORVASC) 10 MG tablet, TAKE 1 TABLET(10 MG) BY MOUTH DAILY, Disp: 90 tablet, Rfl: 1   Ascorbic Acid (VITAMIN C) 500 MG CHEW, Chew by mouth daily., Disp: , Rfl:    aspirin EC 81 MG tablet, Take 81 mg by mouth daily. Swallow whole., Disp: , Rfl:    ciclopirox (PENLAC) 8 % solution, Apply one coat to toenail qd.  Remove weekly with polish remover. (Patient not taking: Reported on 04/07/2023), Disp: 6.6 mL, Rfl: 11   Coenzyme Q10 (COQ-10) 100 MG CAPS, Take 1 tablet by mouth daily. , Disp: , Rfl:    Continuous Blood Gluc Sensor (DEXCOM G7 SENSOR) MISC, Use to check blood sugar 3 times a day. Dx code e11.65, Disp: 3 each, Rfl: 3   Dulaglutide (TRULICITY) 1.5 MG/0.5ML SOPN, Inject 1.5 mg into the skin once a week. Tuesday, Disp: 6 mL, Rfl: 3   estradiol (ESTRACE) 0.1 MG/GM vaginal cream, Place 0.5 g vaginally 2 (two) times a week. Place 0.5g nightly for two weeks then twice a week after, Disp: 30 g, Rfl: 11   Glucagon (GVOKE HYPOPEN 2-PACK) 0.5 MG/0.1ML SOAJ, Use for blood sugars less than 60., Disp: 0.2 mL, Rfl: 3   glucosamine-chondroitin 500-400 MG tablet, Take 1 tablet by mouth daily., Disp: , Rfl:    glucose blood (ONETOUCH VERIO) test strip, CHECK BLOOD SUGAR THREE TIMES DAILY dx: e11.65, Disp: 150 each, Rfl: 11   insulin lispro (HUMALOG KWIKPEN) 100 UNIT/ML KwikPen, Inject subcutaneously 2 units if sugars are 150 - 199, 4 units 200 -249, 6 units 250- 299, 8 units 300 - 349, 10 units >350, Disp: 15 mL, Rfl: 0   Insulin Pen Needle (BD PEN NEEDLE NANO 2ND GEN) 32G X 4 MM MISC, USE TO INJECT INSULIN DAILY, Disp: 100 each, Rfl: 2   MAGNESIUM OXIDE PO, Take 400 mg by mouth at bedtime., Disp: , Rfl:    meloxicam (MOBIC) 15 MG tablet, Take 1 tablet (15 mg total) by mouth daily., Disp: 30 tablet,  Rfl: 0   Misc Natural Products (NEURIVA) CAPS, Take 1 capsule by mouth daily., Disp: , Rfl:    nystatin (NYSTATIN) powder, Apply topically 3 (three) times daily. As needed (Patient not taking: Reported on 04/07/2023), Disp: 60 g, Rfl: 0   omeprazole (PRILOSEC) 20 MG capsule, One capsule twice daily as needed, Disp: 180 capsule, Rfl: 1   OneTouch Delica Lancets 33G MISC, USE AS DIRECTED TO CHECK BLOOD SUGAR THREE TIMES DAILY, Disp: 100 each, Rfl: 0   pregabalin (LYRICA) 50 MG capsule, Take 1 capsule (50 mg total) by mouth at bedtime. TAKE 1 CAPSULE(50 MG) BY MOUTH AT BEDTIME, Disp: 90 capsule, Rfl: 2   rosuvastatin (CRESTOR) 20 MG tablet, TAKE 1 TABLET(20 MG) BY MOUTH DAILY, Disp: 90 tablet,  Rfl: 2   traZODone (DESYREL) 50 MG tablet, TAKE 1 TABLET BY MOUTH EVERY NIGHT AT BEDTIME, Disp: 90 tablet, Rfl: 1   Vitamin A 3 MG (10000 UT) TABS, Take 1 tablet by mouth daily. 2,400 mcg, Disp: , Rfl:    Vitamin D, Ergocalciferol, (DRISDOL) 1.25 MG (50000 UNIT) CAPS capsule, TAKE 1 CAPSULE BY MOUTH EVERY 7 DAYS, Disp: 12 capsule, Rfl: 1   vitamin E 600 UNIT capsule, Take 400 Units by mouth at bedtime., Disp: , Rfl:    Allergies  Allergen Reactions   Fish Allergy Hives    Cod   Latex Rash     Review of Systems  Constitutional: Negative.   Eyes:  Negative for blurred vision.  Respiratory: Negative.    Cardiovascular: Negative.  Negative for chest pain and palpitations.  Gastrointestinal: Negative.   Endocrine: Negative for polydipsia, polyphagia and polyuria.  Neurological: Negative.  Negative for dizziness and headaches.  Psychiatric/Behavioral: Negative.       Today's Vitals   05/28/23 0903  BP: 128/80  Pulse: 70  Temp: 98.1 F (36.7 C)  SpO2: 98%  Weight: 170 lb (77.1 kg)  Height: 5\' 4"  (1.626 m)   Body mass index is 29.18 kg/m.  Wt Readings from Last 3 Encounters:  05/28/23 170 lb (77.1 kg)  05/28/23 170 lb 3.2 oz (77.2 kg)  05/04/23 167 lb 6.4 oz (75.9 kg)     Objective:   Physical Exam Vitals and nursing note reviewed.  Constitutional:      Appearance: Normal appearance.  HENT:     Head: Normocephalic and atraumatic.  Eyes:     Extraocular Movements: Extraocular movements intact.  Cardiovascular:     Rate and Rhythm: Normal rate and regular rhythm.     Heart sounds: Normal heart sounds.  Pulmonary:     Effort: Pulmonary effort is normal.     Breath sounds: Normal breath sounds.  Musculoskeletal:     Cervical back: Normal range of motion.  Skin:    General: Skin is warm.  Neurological:     General: No focal deficit present.     Mental Status: She is alert.  Psychiatric:        Mood and Affect: Mood normal.        Behavior: Behavior normal.         Assessment And Plan:  Type 2 diabetes mellitus with stage 2 chronic kidney disease, with long-term current use of insulin (HCC) Assessment & Plan: Chronic, she will c/w Farxiga 10mg  and Trulicity 1.5mg  weekly.  She also uses Humalog per sliding scale, states she rarely uses it when she is following recommended diet.  I will check labs and adjust meds as needed.   Orders: -     CBC -     CMP14+EGFR -     Hemoglobin A1c -     Microalbumin / creatinine urine ratio -     TSH  Hypertensive heart and renal disease with renal failure, stage 1 through stage 4 or unspecified chronic kidney disease, without heart failure Assessment & Plan: Chronic, fair control. Goal BP<120/80. She is encouraged to follow low sodium diet. She will c/w amlodipine 10mg  daily. We discussed the need for increased exercise. She is encouraged to avoid adding salt to her foods.   Orders: -     CBC -     CMP14+EGFR  Atherosclerosis of aorta (HCC) Assessment & Plan: Chronic, LDL goal < 70. She will c/w rosuvastatin 20mg  daily and ASA 81mg  daily.  She is encouraged to follow a heart healthy lifestyle.    Pure hypercholesterolemia Assessment & Plan: Chronic, LDL goal < 70.  She will c/w rosuvastatin 20mg  daily. Encouraged  to follow a heart healthy lifestyle.   Orders: -     TSH  Gastroesophageal reflux disease without esophagitis Assessment & Plan:   She c/o increased sx of indigestion, reports compliance with omeprazole 20mg  daily. I will increase dose to twice daily for the next 30-60 days. She is reminded to stop eating 3 hours prior to lying down.    Estrogen deficiency -     DG Bone Density; Future  Drug therapy -     Vitamin B12  Other orders -     Omeprazole; One capsule twice daily as needed  Dispense: 180 capsule; Refill: 1     Return in 3 months (on 08/26/2023) for 1 year AWV w thn, and dm check.  Patient was given opportunity to ask questions. Patient verbalized understanding of the plan and was able to repeat key elements of the plan. All questions were answered to their satisfaction.    I, [name], MD, have reviewed all documentation for this visit. The documentation on 05/28/23 for the exam, diagnosis, procedures, and orders are all accurate and complete.   IF YOU HAVE BEEN REFERRED TO A SPECIALIST, IT MAY TAKE 1-2 WEEKS TO SCHEDULE/PROCESS THE REFERRAL. IF YOU HAVE NOT HEARD FROM US/SPECIALIST IN TWO WEEKS, PLEASE GIVE [place] A CALL AT [phone].   THE PATIENT IS ENCOURAGED TO PRACTICE SOCIAL DISTANCING DUE TO THE COVID-19 PANDEMIC.

## 2023-05-29 ENCOUNTER — Ambulatory Visit: Payer: Medicare HMO

## 2023-05-29 LAB — CBC
Hematocrit: 42.4 % (ref 34.0–46.6)
Hemoglobin: 12.7 g/dL (ref 11.1–15.9)
MCH: 22.3 pg — ABNORMAL LOW (ref 26.6–33.0)
MCHC: 30 g/dL — ABNORMAL LOW (ref 31.5–35.7)
MCV: 75 fL — ABNORMAL LOW (ref 79–97)
Platelets: 288 10*3/uL (ref 150–450)
RBC: 5.69 x10E6/uL — ABNORMAL HIGH (ref 3.77–5.28)
RDW: 17.2 % — ABNORMAL HIGH (ref 11.7–15.4)
WBC: 4.7 10*3/uL (ref 3.4–10.8)

## 2023-05-29 LAB — CMP14+EGFR
ALT: 12 [IU]/L (ref 0–32)
AST: 22 [IU]/L (ref 0–40)
Albumin: 4.5 g/dL (ref 3.8–4.8)
Alkaline Phosphatase: 182 [IU]/L — ABNORMAL HIGH (ref 44–121)
BUN/Creatinine Ratio: 14 (ref 12–28)
BUN: 11 mg/dL (ref 8–27)
Bilirubin Total: 0.3 mg/dL (ref 0.0–1.2)
CO2: 25 mmol/L (ref 20–29)
Calcium: 9.2 mg/dL (ref 8.7–10.3)
Chloride: 104 mmol/L (ref 96–106)
Creatinine, Ser: 0.77 mg/dL (ref 0.57–1.00)
Globulin, Total: 3.1 g/dL (ref 1.5–4.5)
Glucose: 101 mg/dL — ABNORMAL HIGH (ref 70–99)
Potassium: 4.3 mmol/L (ref 3.5–5.2)
Sodium: 143 mmol/L (ref 134–144)
Total Protein: 7.6 g/dL (ref 6.0–8.5)
eGFR: 82 mL/min/{1.73_m2} (ref >59)

## 2023-05-29 LAB — HEMOGLOBIN A1C
Est. average glucose Bld gHb Est-mCnc: 157 mg/dL
Hgb A1c MFr Bld: 7.1 % — ABNORMAL HIGH (ref 4.8–5.6)

## 2023-05-29 LAB — TSH: TSH: 0.857 u[IU]/mL (ref 0.450–4.500)

## 2023-05-29 LAB — MICROALBUMIN / CREATININE URINE RATIO
Creatinine, Urine: 68.4 mg/dL
Microalb/Creat Ratio: 27 mg/g{creat} (ref 0–29)
Microalbumin, Urine: 18.2 ug/mL

## 2023-05-29 LAB — VITAMIN B12: Vitamin B-12: 916 pg/mL (ref 232–1245)

## 2023-06-06 NOTE — Assessment & Plan Note (Signed)
Chronic, she will c/w Farxiga 10mg  and Trulicity 1.5mg  weekly.  She also uses Humalog per sliding scale, states she rarely uses it when she is following recommended diet.  I will check labs and adjust meds as needed.

## 2023-06-06 NOTE — Assessment & Plan Note (Addendum)
  She c/o increased sx of indigestion, reports compliance with omeprazole 20mg  daily. I will increase dose to twice daily for the next 30-60 days. She is reminded to stop eating 3 hours prior to lying down.

## 2023-06-06 NOTE — Assessment & Plan Note (Signed)
Chronic, LDL goal < 70.  She will c/w rosuvastatin 20mg  daily. Encouraged to follow a heart healthy lifestyle.

## 2023-06-06 NOTE — Assessment & Plan Note (Addendum)
Chronic, LDL goal < 70. She will c/w rosuvastatin 20mg  daily and ASA 81mg  daily. She is encouraged to follow a heart healthy lifestyle.

## 2023-06-06 NOTE — Assessment & Plan Note (Signed)
Chronic, fair control. Goal BP<120/80. She is encouraged to follow low sodium diet. She will c/w amlodipine 10mg  daily. We discussed the need for increased exercise. She is encouraged to avoid adding salt to her foods.

## 2023-06-22 ENCOUNTER — Other Ambulatory Visit: Payer: Self-pay | Admitting: Internal Medicine

## 2023-06-22 DIAGNOSIS — E1165 Type 2 diabetes mellitus with hyperglycemia: Secondary | ICD-10-CM | POA: Diagnosis not present

## 2023-06-29 ENCOUNTER — Other Ambulatory Visit: Payer: Self-pay | Admitting: Podiatry

## 2023-07-04 ENCOUNTER — Other Ambulatory Visit: Payer: Self-pay | Admitting: Internal Medicine

## 2023-07-06 ENCOUNTER — Other Ambulatory Visit: Payer: Self-pay | Admitting: Internal Medicine

## 2023-07-06 MED ORDER — PREGABALIN 50 MG PO CAPS: 50.0000 mg | ORAL_CAPSULE | Freq: Every day | ORAL | 2 refills | Status: DC

## 2023-07-08 ENCOUNTER — Ambulatory Visit (INDEPENDENT_AMBULATORY_CARE_PROVIDER_SITE_OTHER): Payer: 59 | Admitting: Podiatry

## 2023-07-08 ENCOUNTER — Encounter: Payer: Self-pay | Admitting: Podiatry

## 2023-07-08 DIAGNOSIS — E1142 Type 2 diabetes mellitus with diabetic polyneuropathy: Secondary | ICD-10-CM | POA: Diagnosis not present

## 2023-07-08 DIAGNOSIS — M722 Plantar fascial fibromatosis: Secondary | ICD-10-CM

## 2023-07-08 DIAGNOSIS — E119 Type 2 diabetes mellitus without complications: Secondary | ICD-10-CM | POA: Diagnosis not present

## 2023-07-08 DIAGNOSIS — M2041 Other hammer toe(s) (acquired), right foot: Secondary | ICD-10-CM | POA: Diagnosis not present

## 2023-07-08 DIAGNOSIS — M2042 Other hammer toe(s) (acquired), left foot: Secondary | ICD-10-CM | POA: Diagnosis not present

## 2023-07-08 NOTE — Progress Notes (Signed)
 Subjective:  Patient ID: [name] [name], female    DOB: [birth year],   MRN: [id number]  No chief complaint on file.   73 y.o. female presents for follow-up of left plantar fasciitis. Relates the foot is doing much better about 95%. Has been wearing the brace and stretching and taking meloxicam  which helps. She is also wondering about diabetic shoes it has been a few years since her last pair. She is also due for diabetic foot exam.  Relates burning and tingling in their feet. Patient is diabetic and last A1c was  Lab Results  Component Value Date   HGBA1C 7.1 (H) 05/28/2023   .     PCP:  Cleave Curling, MD    . Denies any other pedal complaints. Denies n/v/f/c.   Past Medical History:  Diagnosis Date   CKD (chronic kidney disease), stage II    COPD (chronic obstructive pulmonary disease) (HCC)    Coronary artery disease 2005   cardiologist--- dr [place] Dusky;    cardiac cath for acute angina 05-21-2004 multivessel disease;  cath after GI consult 12/ 2005  PCTA  w/ DES to prox RCA ;  cath 04-20-2006 patent stent with nonobstruction CAD;  NUC 12/ 2013 normal no ischemia;  last ETT 07-30-2022 no ischemia   Diverticulosis of colon    Dyslipidemia    Full dentures    History of anemia    History of hepatitis C virus infection    per pt was dx many yrs ago and treated in 2017  (noted in epic HCV RNA quan not detectable 10-25-2019)   Hypertension    OSA (obstructive sleep apnea)    study in epic 03-06-2019  moderate osa , (08-14-2022  pt stated she tried  cpap  but just decided to stop using it)   Osteoarthritis    PAD (peripheral artery disease) (HCC)    Peripheral neuropathy    Prolapse of anterior vaginal wall    S/P primary angioplasty with coronary stent 05/2004   PTCA and DES x1 to pRCA   Type 2 diabetes mellitus treated with insulin  (HCC)    followed by pcp   (08-14-2022  per pt checks blood sugar multiple times dialy w/ dexcom 7,  fasting average 101)   Uterovaginal prolapse,  incomplete    Wears glasses     Objective:  Physical Exam: Vascular: DP/PT pulses 2/4 bilateral. CFT <3 seconds. Absent hair growth on digits. Edema noted to bilateral lower extremities. Xerosis noted bilaterally.  Skin. No lacerations or abrasions bilateral feet. Nails 1-5 bilateral  are thickened discolored and elongated with subungual debris.  Musculoskeletal: MMT 5/5 bilateral lower extremities in DF, PF, Inversion and Eversion. Deceased ROM in DF of ankle joint. Tender to the medial calcaneal tubercle left . No pain with achilles, PT or arch. No pain with calcaneal squeeze.  Neurological: Sensation intact to light touch. Protective sensation diminished bilateral.    Assessment:   1. Plantar fasciitis of left foot   2. Encounter for comprehensive diabetic foot examination, type 2 diabetes mellitus (HCC)   3. Type 2 diabetes mellitus with peripheral neuropathy (HCC)   4. Hammer toes of both feet      Plan:  Patient was evaluated and treated and all questions answered. -Discussed and educated patient on diabetic foot care, especially with  regards to the vascular, neurological and musculoskeletal systems.  -Stressed the importance of good glycemic control and the detriment of not  controlling glucose levels in relation to the foot. -Discussed supportive shoes  at all times and checking feet regularly.  -Diabetic foot exam preformed and patient looking to get another pain of DM shoes. Will get started on this for her. -Answered all patient questions Discussed plantar fasciitis with patient.  X-rays reviewed and discussed with patient. No acute fractures or dislocations noted. Mild spurring noted at inferior calcaneus.  Discussed treatment options including, ice, NSAIDS, supportive shoes, bracing, and stretching.  Continue brace and stretching.  Follow-up as needed for this and return as scheduled for [facility].        Jennefer Moats, DPM

## 2023-07-20 ENCOUNTER — Other Ambulatory Visit: Payer: Self-pay

## 2023-07-20 MED ORDER — ONETOUCH DELICA LANCETS 33G MISC: 1 refills | Status: AC

## 2023-08-05 ENCOUNTER — Ambulatory Visit: Payer: 59

## 2023-08-05 NOTE — Progress Notes (Signed)
Patient will be going to [facility] as she does not want to potentially get billed 20% after [facility] picks up their part

## 2023-08-11 ENCOUNTER — Other Ambulatory Visit: Payer: Self-pay

## 2023-08-11 MED ORDER — BD PEN NEEDLE NANO 2ND GEN 32G X 4 MM MISC: 2 refills | Status: AC

## 2023-08-18 ENCOUNTER — Telehealth: Payer: Self-pay

## 2023-08-18 ENCOUNTER — Ambulatory Visit: Payer: Self-pay | Admitting: Internal Medicine

## 2023-08-18 NOTE — Telephone Encounter (Signed)
 Patient called stating a sore throat. Patient was advised to go to urgent care due to all schedules being full. Patient refused to go to urgent care patient was scheduled for next available which is Friday. Patient stated her throat was not that sore she doesn't do urgent cares.

## 2023-08-18 NOTE — Telephone Encounter (Signed)
  Chief Complaint: Sore throat Symptoms: pain, runny nose, congestion, fever, body aches Frequency: Began yesterday Pertinent Negatives: Patient denies CP, SOB, NVD Disposition: [] ED /[x] Urgent Care (no appt availability in office) / [] Appointment(In office/virtual)/ []  [facility]/ [] Home Care/ [x] Refused Recommended Disposition /[]  Mobile Bus/ []  Follow-up with PCP Additional Notes: Patient calls reporting sore throat that began yesterday after caring for grandchild who was sick over the weekend. Reports pain is 7/10. Per protocol, patient to be evaluated within 24 hours. First available appointment with PCP or any provider in clinic outside of guidelines. Patient declined to be seen in alternate clinic or urgent care, requesting to stay within office. Care advice reviewed, patient verbalized understandingand denies further questions at this time. Alerting PCP for review and follow up with patient.    Reason for Disposition  SEVERE (e.g., excruciating) throat pain  Answer Assessment - Initial Assessment Questions 1. ONSET: "When did the throat start hurting?" (Hours or days ago)      Began yesterday 2. SEVERITY: "How bad is the sore throat?" (Scale 1-10; mild, moderate or severe)   - MILD (1-3):  Doesn't interfere with eating or normal activities.   - MODERATE (4-7): Interferes with eating some solids and normal activities.   - SEVERE (8-10):  Excruciating pain, interferes with most normal activities.   - SEVERE WITH DYSPHAGIA (10): Can't swallow liquids, drooling.     7/10 3. STREP EXPOSURE: "Has there been any exposure to strep within the past week?" If Yes, ask: "What type of contact occurred?"      Sick grandchild, unsure if it was strep. 4.  VIRAL SYMPTOMS: "Are there any symptoms of a cold, such as a runny nose, cough, hoarse voice or red eyes?"      Runny nose, hoarse voice 5. FEVER: "Do you have a fever?" If Yes, ask: "What is your temperature, how was it  measured, and when did it start?"     Felt wet in the middle of the night, has not measured 6. PUS ON THE TONSILS: "Is there pus on the tonsils in the back of your throat?"     Has not looked 7. OTHER SYMPTOMS: "Do you have any other symptoms?" (e.g., difficulty breathing, headache, rash)     Runny nose, congestion, fever, body aches 8. PREGNANCY: "Is there any chance you are pregnant?" "When was your last menstrual period?"     NA  Protocols used: Sore Throat-A-AH

## 2023-08-21 ENCOUNTER — Encounter: Payer: Self-pay | Admitting: Family Medicine

## 2023-08-21 ENCOUNTER — Other Ambulatory Visit: Payer: Self-pay

## 2023-08-21 ENCOUNTER — Ambulatory Visit (INDEPENDENT_AMBULATORY_CARE_PROVIDER_SITE_OTHER): Payer: 59 | Admitting: Family Medicine

## 2023-08-21 VITALS — BP 120/60 | HR 80 | Temp 98.1°F | Ht 64.0 in | Wt 170.0 lb

## 2023-08-21 DIAGNOSIS — J029 Acute pharyngitis, unspecified: Secondary | ICD-10-CM | POA: Diagnosis not present

## 2023-08-21 DIAGNOSIS — E1122 Type 2 diabetes mellitus with diabetic chronic kidney disease: Secondary | ICD-10-CM

## 2023-08-21 DIAGNOSIS — R0981 Nasal congestion: Secondary | ICD-10-CM | POA: Diagnosis not present

## 2023-08-21 LAB — POCT INFLUENZA A/B
Influenza A, POC: NEGATIVE
Influenza B, POC: NEGATIVE

## 2023-08-21 MED ORDER — MOMETASONE FUROATE 50 MCG/ACT NA SUSP: 2.0000 | Freq: Every day | NASAL | 2 refills | Status: AC

## 2023-08-21 NOTE — Progress Notes (Signed)
 I,Jameka J Llittleton, CMA,acting as a Neurosurgeon for [employer], NP.,have documented all relevant documentation on the behalf of Ellender Hose, NP,as directed by  Ellender Hose, NP while in the presence of Ellender Hose, NP.  Subjective:  Patient ID: [name] [name] , female    DOB: January 16, 1951 , 73 y.o.   MRN: [id number]  Chief Complaint  Patient presents with   URI    HPI  Patient is 73 year female who presents today for cold symptoms. Patient states she was exposed to the flu over the weekend when she kept watch over . She kept her nephew over the weekend. She states that after then  she has been having bodyaches in the morning and itchy throat but after a few hours she get back to herself feeling better. She denies any fever,chills or headaches. She has been taking the coricidin for her symptoms as needed.      Past Medical History:  Diagnosis Date   CKD (chronic kidney disease), stage II    COPD (chronic obstructive pulmonary disease) (HCC)    Coronary artery disease 2005   cardiologist--- dr [place] Lions;    cardiac cath for acute angina 05-21-2004 multivessel disease;  cath after GI consult 12/ 2005  PCTA  w/ DES to prox RCA ;  cath 04-20-2006 patent stent with nonobstruction CAD;  NUC 12/ 2013 normal no ischemia;  last ETT 07-30-2022 no ischemia   Diverticulosis of colon    Dyslipidemia    Full dentures    History of anemia    History of hepatitis C virus infection    per pt was dx many yrs ago and treated in 2017  (noted in epic HCV RNA quan not detectable 10-25-2019)   Hypertension    OSA (obstructive sleep apnea)    study in epic 03-06-2019  moderate osa , (08-14-2022  pt stated she tried  cpap  but just decided to stop using it)   Osteoarthritis    PAD (peripheral artery disease) (HCC)    Peripheral neuropathy    Prolapse of anterior vaginal wall    S/P primary angioplasty with coronary stent 05/2004   PTCA and DES x1 to pRCA   Type 2 diabetes mellitus treated with insulin (HCC)     followed by pcp   (08-14-2022  per pt checks blood sugar multiple times dialy w/ dexcom 7,  fasting average 101)   Uterovaginal prolapse, incomplete    Wears glasses      Family History  Problem Relation Age of Onset   CAD Father 30   Heart attack Father    CAD Mother 29   Heart attack Mother    CAD Brother    Cancer Brother        prostate   CAD Sister    Cancer Sister        breast   CAD Brother      Current Outpatient Medications:    Ascorbic Acid (VITAMIN C) 500 MG CHEW, Chew by mouth daily., Disp: , Rfl:    aspirin EC 81 MG tablet, Take 81 mg by mouth daily. Swallow whole., Disp: , Rfl:    ciclopirox (PENLAC) 8 % solution, Apply one coat to toenail qd.  Remove weekly with polish remover. (Patient not taking: Reported on 08/26/2023), Disp: 6.6 mL, Rfl: 11   Coenzyme Q10 (COQ-10) 100 MG CAPS, Take 1 tablet by mouth daily. , Disp: , Rfl:    Continuous Blood Gluc Sensor (DEXCOM G7 SENSOR) MISC, Use to check blood  sugar 3 times a day. Dx code e11.65, Disp: 3 each, Rfl: 3   Dulaglutide (TRULICITY) 1.5 MG/0.5ML SOPN, Inject 1.5 mg into the skin once a week. Tuesday, Disp: 6 mL, Rfl: 3   estradiol (ESTRACE) 0.1 MG/GM vaginal cream, Place 0.5 g vaginally 2 (two) times a week. Place 0.5g nightly for two weeks then twice a week after, Disp: 30 g, Rfl: 11   Glucagon (GVOKE HYPOPEN 2-PACK) 0.5 MG/0.1ML SOAJ, Use for blood sugars less than 60., Disp: 0.2 mL, Rfl: 3   glucosamine-chondroitin 500-400 MG tablet, Take 1 tablet by mouth daily., Disp: , Rfl:    glucose blood (ONETOUCH VERIO) test strip, CHECK BLOOD SUGAR THREE TIMES DAILY dx: e11.65, Disp: 150 each, Rfl: 11   insulin lispro (HUMALOG KWIKPEN) 100 UNIT/ML KwikPen, Inject subcutaneously 2 units if sugars are 150 - 199, 4 units 200 -249, 6 units 250- 299, 8 units 300 - 349, 10 units >350, Disp: 15 mL, Rfl: 0   Insulin Pen Needle (BD PEN NEEDLE NANO 2ND GEN) 32G X 4 MM MISC, USE TO INJECT INSULIN DAILY, Disp: 100 each, Rfl: 2   MAGNESIUM  OXIDE PO, Take 400 mg by mouth at bedtime., Disp: , Rfl:    meloxicam (MOBIC) 15 MG tablet, TAKE 1 TABLET(15 MG) BY MOUTH DAILY, Disp: 30 tablet, Rfl: 0   Misc Natural Products (NEURIVA) CAPS, Take 1 capsule by mouth daily., Disp: , Rfl:    mometasone (NASONEX) 50 MCG/ACT nasal spray, Place 2 sprays into the nose daily., Disp: 1 each, Rfl: 2   nystatin (NYSTATIN) powder, Apply topically 3 (three) times daily. As needed, Disp: 60 g, Rfl: 0   omeprazole (PRILOSEC) 20 MG capsule, One capsule twice daily as needed, Disp: 180 capsule, Rfl: 1   OneTouch Delica Lancets 33G MISC, USE AS DIRECTED TO CHECK BLOOD SUGAR THREE TIMES DAILY, Disp: 100 each, Rfl: 1   pregabalin (LYRICA) 50 MG capsule, Take 1 capsule (50 mg total) by mouth at bedtime. TAKE 1 CAPSULE(50 MG) BY MOUTH AT BEDTIME, Disp: 90 capsule, Rfl: 2   traZODone (DESYREL) 50 MG tablet, TAKE 1 TABLET BY MOUTH EVERY NIGHT AT BEDTIME, Disp: 90 tablet, Rfl: 1   Vitamin A 3 MG (10000 UT) TABS, Take 1 tablet by mouth daily. 2,400 mcg, Disp: , Rfl:    Vitamin D, Ergocalciferol, (DRISDOL) 1.25 MG (50000 UNIT) CAPS capsule, TAKE 1 CAPSULE BY MOUTH EVERY 7 DAYS, Disp: 12 capsule, Rfl: 1   vitamin E 600 UNIT capsule, Take 400 Units by mouth at bedtime., Disp: , Rfl:    amLODipine (NORVASC) 10 MG tablet, TAKE 1 TABLET(10 MG) BY MOUTH DAILY, Disp: 90 tablet, Rfl: 1   FARXIGA 10 MG TABS tablet, Take 10 mg by mouth daily., Disp: , Rfl:    metoprolol tartrate (LOPRESSOR) 50 MG tablet, Take 50 mg by mouth 2 (two) times daily., Disp: , Rfl:    rosuvastatin (CRESTOR) 40 MG tablet, Take 1 tablet (40 mg total) by mouth daily., Disp: 90 tablet, Rfl: 3   Allergies  Allergen Reactions   Fish Allergy Hives    Cod   Latex Rash     Review of Systems  Constitutional: Negative.   HENT:  Positive for congestion and sore throat.   Eyes: Negative.   Respiratory:  Negative for wheezing.   Cardiovascular: Negative.   Neurological: Negative.   Psychiatric/Behavioral:  Negative.       Today's Vitals   08/21/23 0836  BP: 120/60  Pulse: 80  Temp: 98.1  F (36.7 C)  TempSrc: Oral  Weight: 170 lb (77.1 kg)  Height: 5\' 4"  (1.626 m)  PainSc: 0-No pain   Body mass index is 29.18 kg/m.  Wt Readings from Last 3 Encounters:  08/26/23 177 lb (80.3 kg)  08/21/23 170 lb (77.1 kg)  05/28/23 170 lb (77.1 kg)    The 10-year ASCVD risk score (Arnett DK, et al., 2019) is: 24.3%   Values used to calculate the score:     Age: 26 years     Sex: Female     Is Non-Hispanic African American: Yes     Diabetic: Yes     Tobacco smoker: No     Systolic Blood Pressure: 122 mmHg     Is BP treated: Yes     HDL Cholesterol: 64 mg/dL     Total Cholesterol: 171 mg/dL  Objective:  Physical Exam HENT:     Head: Normocephalic.  Cardiovascular:     Rate and Rhythm: Normal rate.  Pulmonary:     Effort: Pulmonary effort is normal.     Breath sounds: No wheezing or rales.  Skin:    General: Skin is warm and dry.  Neurological:     Mental Status: She is alert and oriented to person, place, and time.         Assessment And Plan:  Sore throat -     POCT Influenza A/B  Nasal congestion -     Mometasone Furoate; Place 2 sprays into the nose daily.  Dispense: 1 each; Refill: 2    Return if symptoms worsen or fail to improve, for Keep next appt.  Patient was given opportunity to ask questions. Patient verbalized understanding of the plan and was able to repeat key elements of the plan. All questions were answered to their satisfaction.  I, Ellender Hose, NP, have reviewed all documentation for this visit. The documentation on 08/26/2023 for the exam, diagnosis, procedures, and orders are all accurate and complete.    IF YOU HAVE BEEN REFERRED TO A SPECIALIST, IT MAY TAKE 1-2 WEEKS TO SCHEDULE/PROCESS THE REFERRAL. IF YOU HAVE NOT HEARD FROM US/SPECIALIST IN TWO WEEKS, PLEASE GIVE [place] A CALL AT [phone].

## 2023-08-23 NOTE — Progress Notes (Unsigned)
 Cardiology Office Note:   Date:  08/26/2023  ID:  [name] [name], DOB March 29, 1951, MRN [id number] PCP: [name], MD  [facility] Providers Cardiologist:  Rollene Rotunda, MD {  History of Present Illness:   [name] [name] is a 73 y.o. female who presents follow up of CAD.   She had a history of a PCI in 2005.  This was to the proximal RCA.  In 2005 follow-up cath she had 40% distal left main stenosis, ostial LAD 50 to 60% stenosis and distal 50 to 60% stenosis in the right with a patent stent.  She had a stress perfusion study in 2013 demonstrated no evidence of ischemia and well-preserved ejection fraction.  In 2024 she had a negative inadequate POET (Plain Old Exercise Treadmill).  This was done preop prior to bladder surgery.  She had some dyspnea in 2020 and then actually had an echo which was okay.    She has had some stress with a granddaughter and nephew dying a couple of years ago.  She still works in catering and stays active.  She has had some chest discomfort on the left breast.  This has been going on since November couple of times a week.  Seems to be stable pattern.  It is sharp.  Sleeping.  There is no associated nausea vomiting or diaphoresis.  She does not have shortness of breath, PND or orthopnea.  She cannot bring it on with activity.  She does not think is like previous angina.   ROS: As stated in the HPI and negative for all other systems.  Studies Reviewed:    EKG:   EKG Interpretation Date/Time:  Wednesday August 26 2023 08:55:26 EST Ventricular Rate:  58 PR Interval:  202 QRS Duration:  80 QT Interval:  442 QTC Calculation: 433 R Axis:   -18  Text Interpretation: Sinus bradycardia Cannot rule out Anterior infarct , age undetermined When compared with ECG of 19-Sep-2014 14:07, No significant change since last tracing Confirmed by Rollene Rotunda ([id number]) on 08/26/2023 8:59:21 AM    Risk Assessment/Calculations:              Physical Exam:   VS:  BP  122/72 (BP Location: Left Arm, Patient Position: Sitting, Cuff Size: Normal)   Pulse (!) 58   Ht 5\' 4"  (1.626 m)   Wt 177 lb (80.3 kg)   SpO2 97%   BMI 30.38 kg/m    Wt Readings from Last 3 Encounters:  08/26/23 177 lb (80.3 kg)  08/21/23 170 lb (77.1 kg)  05/28/23 170 lb (77.1 kg)     GEN: Well nourished, well developed in no acute distress NECK: No JVD; No carotid bruits CARDIAC: RRR, no murmurs, rubs, gallops RESPIRATORY:  Clear to auscultation without rales, wheezing or rhonchi  ABDOMEN: Soft, non-tender, non-distended EXTREMITIES:  No edema; No deformity   ASSESSMENT AND PLAN:   CAD:     The patient has no new sypmtoms.  No further cardiovascular testing is indicated.  We will continue with aggressive risk reduction and meds as listed.the pain she is having sounds nonanginal.  She will continue with risk reduction.  DYSLIPIDEMIA:   LDL was 87 with an HDL of 64.   She is not at goal so ongoing to increase her Crestor to 40 mg daily and check a lipid profile in 3 months.  I think her goal LDL should be in the 50s.   DM:  Her A1c was 7.1 which was unchanged from previous.  No change in therapy and she is followed by Dr. [name], MD    HTN: Her blood pressure is at target no change in therapy.   SLEEP APNEA: She has been unable to wear CPAP.     Follow up with me in one year.   Signed, Rollene Rotunda, MD

## 2023-08-24 ENCOUNTER — Other Ambulatory Visit: Payer: Self-pay | Admitting: Internal Medicine

## 2023-08-25 ENCOUNTER — Encounter: Payer: Self-pay | Admitting: Podiatry

## 2023-08-25 ENCOUNTER — Ambulatory Visit (INDEPENDENT_AMBULATORY_CARE_PROVIDER_SITE_OTHER): Payer: Medicare HMO | Admitting: Podiatry

## 2023-08-25 DIAGNOSIS — M79674 Pain in right toe(s): Secondary | ICD-10-CM | POA: Diagnosis not present

## 2023-08-25 DIAGNOSIS — Q828 Other specified congenital malformations of skin: Secondary | ICD-10-CM | POA: Diagnosis not present

## 2023-08-25 DIAGNOSIS — B351 Tinea unguium: Secondary | ICD-10-CM | POA: Diagnosis not present

## 2023-08-25 DIAGNOSIS — M79675 Pain in left toe(s): Secondary | ICD-10-CM

## 2023-08-25 DIAGNOSIS — E119 Type 2 diabetes mellitus without complications: Secondary | ICD-10-CM | POA: Diagnosis not present

## 2023-08-26 ENCOUNTER — Ambulatory Visit: Payer: 59 | Attending: Cardiology | Admitting: Cardiology

## 2023-08-26 ENCOUNTER — Ambulatory Visit
Admission: RE | Admit: 2023-08-26 | Discharge: 2023-08-26 | Disposition: A | Discharge status: Home / Self Care | Payer: Medicare HMO | Source: Ambulatory Visit | Attending: Internal Medicine | Admitting: Internal Medicine

## 2023-08-26 ENCOUNTER — Encounter: Payer: Self-pay | Admitting: Cardiology

## 2023-08-26 VITALS — BP 122/72 | HR 58 | Ht 64.0 in | Wt 177.0 lb

## 2023-08-26 DIAGNOSIS — I10 Essential (primary) hypertension: Secondary | ICD-10-CM

## 2023-08-26 DIAGNOSIS — E118 Type 2 diabetes mellitus with unspecified complications: Secondary | ICD-10-CM

## 2023-08-26 DIAGNOSIS — E785 Hyperlipidemia, unspecified: Secondary | ICD-10-CM

## 2023-08-26 DIAGNOSIS — M8588 Other specified disorders of bone density and structure, other site: Secondary | ICD-10-CM | POA: Diagnosis not present

## 2023-08-26 DIAGNOSIS — I2510 Atherosclerotic heart disease of native coronary artery without angina pectoris: Secondary | ICD-10-CM

## 2023-08-26 DIAGNOSIS — E2839 Other primary ovarian failure: Secondary | ICD-10-CM

## 2023-08-26 MED ORDER — ROSUVASTATIN CALCIUM 40 MG PO TABS: 40.0000 mg | ORAL_TABLET | Freq: Every day | ORAL | 3 refills | Status: DC

## 2023-08-26 NOTE — Patient Instructions (Signed)
 Medication Instructions:  Increase rosuvastatin to 40 mg by mouth daily. New script sent. *If you need a refill on your cardiac medications before your next appointment, please call your pharmacy*   Lab Work: Fasting lipid profile in 3 months. Due June 2025. No appointment needed. Have done at new building. If you have labs (blood work) drawn today and your tests are completely normal, you will receive your results only by: MyChart Message (if you have MyChart) OR A paper copy in the mail If you have any lab test that is abnormal or we need to change your treatment, we will call you to review the results.   Follow-Up: At [facility], you and your health needs are our priority.  As part of our continuing mission to provide you with exceptional heart care, we have created designated Provider Care Teams.  These Care Teams include your primary Cardiologist (physician) and Advanced Practice Providers (APPs -  Physician Assistants and Nurse Practitioners) who all work together to provide you with the care you need, when you need it.  We recommend signing up for the patient portal called "MyChart".  Sign up information is provided on this After Visit Summary.  MyChart is used to connect with patients for Virtual Visits (Telemedicine).  Patients are able to view lab/test results, encounter notes, upcoming appointments, etc.  Non-urgent messages can be sent to your provider as well.   To learn more about what you can do with MyChart, go to [email or web address].    Your next appointment:   1 year(s)  Provider:   Rollene Rotunda, MD     Other Instructions

## 2023-08-27 ENCOUNTER — Encounter: Payer: Self-pay | Admitting: Internal Medicine

## 2023-08-28 ENCOUNTER — Ambulatory Visit: Payer: Medicare HMO | Admitting: Cardiology

## 2023-08-30 NOTE — Progress Notes (Signed)
 Subjective:  Patient ID: [name] [name], female    DOB: 1950-11-30,  MRN: [id number]  [name] [name] presents to clinic today for preventative diabetic foot care and painful porokeratotic lesion(s) of both feet and painful mycotic toenails that limit ambulation. Painful toenails interfere with ambulation. Aggravating factors include wearing enclosed shoe gear. Pain is relieved with periodic professional debridement. Painful porokeratotic lesions are aggravated when weightbearing with and without shoegear. Pain is relieved with periodic professional debridement. Patient states she was informed she needs to obtain her diabetic shoes from [employer]. Chief Complaint  Patient presents with   [facility]    She is here for a nail trim, she needs to get a paper for diabetic shoes, PCP is Office manager and she seen her in December,    New problem(s): None.   PCP is [name], MD.  Allergies  Allergen Reactions   Fish Allergy Hives    Cod   Latex Rash    Review of Systems: Negative except as noted in the HPI.  Objective: No changes noted in today's physical examination. There were no vitals filed for this visit. [name] [name] is a pleasant 73 y.o. female WD, WN in NAD. AAO x 3.  Vascular Examination: Capillary refill time immediate b/l. Vascular status intact b/l with palpable pedal pulses. Pedal hair present b/l. No edema. No pain with calf compression b/l. Skin temperature gradient WNL b/l. No ischemia or gangrene noted b/l LE. No cyanosis or clubbing noted b/l LE.  Neurological Examination: Sensation grossly intact b/l with 10 gram monofilament. Vibratory sensation intact b/l. Protective sensation diminished with 10g monofilament b/l. Vibratory sensation intact b/l.  Dermatological Examination: Pedal skin with normal turgor, texture and tone b/l.   Toenails 1-5 b/l thick, discolored, elongated with subungual debris and pain on dorsal palpation. No open wounds b/l  LE. No interdigital macerations noted b/l LE.   Porokeratotic lesion(s) plantar aspect of left foot x 2 and plantar aspect of right foot x 2. No erythema, no edema, no drainage, no fluctuance.  Musculoskeletal Examination: Normal muscle strength 5/5 to all lower extremity muscle groups bilaterally. HAV with bunion bilaterally and hammertoes 2-5 b/l. Pes planus deformity noted bilateral LE.Marland Kitchen No pain, crepitus or joint limitation noted with ROM b/l LE.  Patient ambulates independently without assistive aids.  Assessment/Plan: 1. Pain due to onychomycosis of toenails of both feet   2. Porokeratosis   3. Diabetes mellitus without complication (HCC)     -Consent given for treatment as described below: -Examined patient. -Patient to continue soft, supportive shoe gear daily. -Rx for [employer] dispensed to patient for one pair diabetic shoes and 3 pair total contact insoles. -Mycotic toenails 1-5 bilaterally were debrided in length and girth with sterile nail nippers and dremel without incident. -Porokeratotic lesion(s) plantarlateral aspect of midfoot left foot x 2 and plantarlateral aspect of midfoot right foot x 2 pared and enucleated with sterile currette without incident. Total number of lesions debrided=4. -Patient/POA to call should there be question/concern in the interim.   Return in about 4 months (around 12/25/2023).  Freddie Breech, DPM      [place] LOCATION: [address], [place] [address]  Office [phone]   [facility] LOCATION: [address] [place], [place] [address] Office [phone]

## 2023-08-31 ENCOUNTER — Ambulatory Visit (INDEPENDENT_AMBULATORY_CARE_PROVIDER_SITE_OTHER): Payer: Medicare HMO | Admitting: Internal Medicine

## 2023-08-31 ENCOUNTER — Encounter: Payer: Self-pay | Admitting: Internal Medicine

## 2023-08-31 VITALS — BP 124/72 | HR 64 | Temp 98.2°F | Ht 64.0 in | Wt 175.4 lb

## 2023-08-31 DIAGNOSIS — E7800 Pure hypercholesterolemia, unspecified: Secondary | ICD-10-CM | POA: Diagnosis not present

## 2023-08-31 DIAGNOSIS — J449 Chronic obstructive pulmonary disease, unspecified: Secondary | ICD-10-CM | POA: Diagnosis not present

## 2023-08-31 DIAGNOSIS — G4733 Obstructive sleep apnea (adult) (pediatric): Secondary | ICD-10-CM

## 2023-08-31 DIAGNOSIS — N182 Chronic kidney disease, stage 2 (mild): Secondary | ICD-10-CM

## 2023-08-31 DIAGNOSIS — E2839 Other primary ovarian failure: Secondary | ICD-10-CM

## 2023-08-31 DIAGNOSIS — Z794 Long term (current) use of insulin: Secondary | ICD-10-CM | POA: Diagnosis not present

## 2023-08-31 DIAGNOSIS — M79642 Pain in left hand: Secondary | ICD-10-CM | POA: Insufficient documentation

## 2023-08-31 DIAGNOSIS — I700 Atherosclerosis of aorta: Secondary | ICD-10-CM | POA: Diagnosis not present

## 2023-08-31 DIAGNOSIS — E6609 Other obesity due to excess calories: Secondary | ICD-10-CM

## 2023-08-31 DIAGNOSIS — I1310 Hypertensive heart and chronic kidney disease without heart failure, with stage 1 through stage 4 chronic kidney disease, or unspecified chronic kidney disease: Secondary | ICD-10-CM

## 2023-08-31 DIAGNOSIS — E1122 Type 2 diabetes mellitus with diabetic chronic kidney disease: Secondary | ICD-10-CM | POA: Diagnosis not present

## 2023-08-31 DIAGNOSIS — K219 Gastro-esophageal reflux disease without esophagitis: Secondary | ICD-10-CM | POA: Diagnosis not present

## 2023-08-31 DIAGNOSIS — E66811 Obesity, class 1: Secondary | ICD-10-CM

## 2023-08-31 DIAGNOSIS — M79641 Pain in right hand: Secondary | ICD-10-CM | POA: Diagnosis not present

## 2023-08-31 DIAGNOSIS — Z6830 Body mass index (BMI) 30.0-30.9, adult: Secondary | ICD-10-CM

## 2023-08-31 NOTE — Patient Instructions (Signed)

## 2023-08-31 NOTE — Assessment & Plan Note (Signed)
Her BMI is acceptable for her demographic. She is encouraged to aim for at least 150 minutes of exercise per week.

## 2023-08-31 NOTE — Assessment & Plan Note (Signed)
 Chronic, she will c/w Farxiga 10mg  and Trulicity 1.5mg  weekly.  She also uses Humalog per sliding scale, states she rarely uses it when she is following recommended diet.  I will check labs and adjust meds as needed.

## 2023-08-31 NOTE — Assessment & Plan Note (Signed)
 Chronic, LDL goal < 70. She will c/w rosuvastatin 20mg  daily and ASA 81mg  daily. She is encouraged to follow a heart healthy lifestyle.

## 2023-08-31 NOTE — Addendum Note (Signed)
 Addended by: [name] on: 08/31/2023 10:22 AM   Modules accepted: Orders

## 2023-08-31 NOTE — Assessment & Plan Note (Signed)
 Chronic, she admits noncompliance w/ CPAP.  States she doesn't sleep well when using the CPAP.  She is encouraged to use CPAP at least four hours per night.

## 2023-08-31 NOTE — Assessment & Plan Note (Signed)
 Chronic, fair control. Goal BP<120/80. She is encouraged to follow low sodium diet. She will c/w amlodipine 10mg  daily. We discussed the need for increased exercise. She is encouraged to avoid adding salt to her foods.

## 2023-08-31 NOTE — Assessment & Plan Note (Signed)
Chronic, LDL goal < 70.  She will c/w rosuvastatin 20mg  daily. Encouraged to follow a heart healthy lifestyle.

## 2023-08-31 NOTE — Progress Notes (Addendum)
 I,[name], CMA,acting as a Neurosurgeon for [name], MD.,have documented all relevant documentation on the behalf of [name], MD,as directed by  [name], MD while in the presence of [name], MD.  Subjective:  Patient ID: [name] [name] , female    DOB: 1951-02-11 , 73 y.o.   MRN: [id number]  Chief Complaint  Patient presents with   Diabetes   Hypertension   Hyperlipidemia    HPI  Patient presents today for a diabetes, HTN & cholesterol check. Patient reports compliance with her meds. She denies headaches, chest pain and shortness of breath.  She has no specific concerns at this time.       Diabetes She presents for her follow-up diabetic visit. She has type 2 diabetes mellitus. Her disease course has been improving. Pertinent negatives for hypoglycemia include no dizziness or headaches. Pertinent negatives for diabetes include no blurred vision, no chest pain, no polydipsia, no polyphagia and no polyuria. There are no hypoglycemic complications. Risk factors for coronary artery disease include diabetes mellitus, dyslipidemia, hypertension and post-menopausal. Current diabetic treatment includes insulin injections. She is compliant with treatment most of the time. She is following a diabetic diet. She participates in exercise intermittently. Eye exam is current.  Hypertension This is a chronic problem. The current episode started more than 1 year ago. The problem has been gradually improving since onset. The problem is controlled. Pertinent negatives include no blurred vision, chest pain, headaches or palpitations. Past treatments include angiotensin blockers. The current treatment provides moderate improvement. Compliance problems include exercise and diet.  Hypertensive end-organ damage includes kidney disease. Identifiable causes of hypertension include chronic renal disease.  Hyperlipidemia This is a chronic problem. The current episode started more  than 1 year ago. Exacerbating diseases include chronic renal disease. Pertinent negatives include no chest pain. Current antihyperlipidemic treatment includes statins. The current treatment provides moderate improvement of lipids. Risk factors for coronary artery disease include diabetes mellitus, dyslipidemia, hypertension, post-menopausal and a sedentary lifestyle.     Past Medical History:  Diagnosis Date   CKD (chronic kidney disease), stage II    COPD (chronic obstructive pulmonary disease) (HCC)    Coronary artery disease 2005   cardiologist--- dr McCune Lions;    cardiac cath for acute angina 05-21-2004 multivessel disease;  cath after GI consult 12/ 2005  PCTA  w/ DES to prox RCA ;  cath 04-20-2006 patent stent with nonobstruction CAD;  NUC 12/ 2013 normal no ischemia;  last ETT 07-30-2022 no ischemia   Diverticulosis of colon    Dyslipidemia    Full dentures    History of anemia    History of hepatitis C virus infection    per pt was dx many yrs ago and treated in 2017  (noted in epic HCV RNA quan not detectable 10-25-2019)   Hypertension    OSA (obstructive sleep apnea)    study in epic 03-06-2019  moderate osa , (08-14-2022  pt stated she tried  cpap  but just decided to stop using it)   Osteoarthritis    PAD (peripheral artery disease) (HCC)    Peripheral neuropathy    Prolapse of anterior vaginal wall    S/P primary angioplasty with coronary stent 05/2004   PTCA and DES x1 to pRCA   Type 2 diabetes mellitus treated with insulin (HCC)    followed by pcp   (08-14-2022  per pt checks blood sugar multiple times dialy w/ dexcom 7,  fasting average 101)  Uterovaginal prolapse, incomplete    Wears glasses      Family History  Problem Relation Age of Onset   CAD Father 38   Heart attack Father    CAD Mother 66   Heart attack Mother    CAD Brother    Cancer Brother        prostate   CAD Sister    Cancer Sister        breast   CAD Brother      Current Outpatient  Medications:    amLODipine (NORVASC) 10 MG tablet, TAKE 1 TABLET(10 MG) BY MOUTH DAILY, Disp: 90 tablet, Rfl: 1   Ascorbic Acid (VITAMIN C) 500 MG CHEW, Chew by mouth daily., Disp: , Rfl:    aspirin EC 81 MG tablet, Take 81 mg by mouth daily. Swallow whole., Disp: , Rfl:    Coenzyme Q10 (COQ-10) 100 MG CAPS, Take 1 tablet by mouth daily. , Disp: , Rfl:    Continuous Blood Gluc Sensor (DEXCOM G7 SENSOR) MISC, Use to check blood sugar 3 times a day. Dx code e11.65, Disp: 3 each, Rfl: 3   Dulaglutide (TRULICITY) 1.5 MG/0.5ML SOPN, Inject 1.5 mg into the skin once a week. Tuesday, Disp: 6 mL, Rfl: 3   estradiol (ESTRACE) 0.1 MG/GM vaginal cream, Place 0.5 g vaginally 2 (two) times a week. Place 0.5g nightly for two weeks then twice a week after, Disp: 30 g, Rfl: 11   FARXIGA 10 MG TABS tablet, Take 10 mg by mouth daily., Disp: , Rfl:    Glucagon (GVOKE HYPOPEN 2-PACK) 0.5 MG/0.1ML SOAJ, Use for blood sugars less than 60., Disp: 0.2 mL, Rfl: 3   glucosamine-chondroitin 500-400 MG tablet, Take 1 tablet by mouth daily., Disp: , Rfl:    glucose blood (ONETOUCH VERIO) test strip, CHECK BLOOD SUGAR THREE TIMES DAILY dx: e11.65, Disp: 150 each, Rfl: 11   insulin lispro (HUMALOG KWIKPEN) 100 UNIT/ML KwikPen, Inject subcutaneously 2 units if sugars are 150 - 199, 4 units 200 -249, 6 units 250- 299, 8 units 300 - 349, 10 units >350, Disp: 15 mL, Rfl: 0   Insulin Pen Needle (BD PEN NEEDLE NANO 2ND GEN) 32G X 4 MM MISC, USE TO INJECT INSULIN DAILY, Disp: 100 each, Rfl: 2   MAGNESIUM OXIDE PO, Take 400 mg by mouth at bedtime., Disp: , Rfl:    meloxicam (MOBIC) 15 MG tablet, TAKE 1 TABLET(15 MG) BY MOUTH DAILY, Disp: 30 tablet, Rfl: 0   metoprolol tartrate (LOPRESSOR) 50 MG tablet, Take 50 mg by mouth 2 (two) times daily., Disp: , Rfl:    Misc Natural Products (NEURIVA) CAPS, Take 1 capsule by mouth daily., Disp: , Rfl:    mometasone (NASONEX) 50 MCG/ACT nasal spray, Place 2 sprays into the nose daily., Disp: 1  each, Rfl: 2   nystatin (NYSTATIN) powder, Apply topically 3 (three) times daily. As needed, Disp: 60 g, Rfl: 0   omeprazole (PRILOSEC) 20 MG capsule, One capsule twice daily as needed, Disp: 180 capsule, Rfl: 1   OneTouch Delica Lancets 33G MISC, USE AS DIRECTED TO CHECK BLOOD SUGAR THREE TIMES DAILY, Disp: 100 each, Rfl: 1   pregabalin (LYRICA) 50 MG capsule, Take 1 capsule (50 mg total) by mouth at bedtime. TAKE 1 CAPSULE(50 MG) BY MOUTH AT BEDTIME, Disp: 90 capsule, Rfl: 2   rosuvastatin (CRESTOR) 40 MG tablet, Take 1 tablet (40 mg total) by mouth daily., Disp: 90 tablet, Rfl: 3   traZODone (DESYREL) 50 MG tablet, TAKE 1  TABLET BY MOUTH EVERY NIGHT AT BEDTIME, Disp: 90 tablet, Rfl: 1   Vitamin A 3 MG (10000 UT) TABS, Take 1 tablet by mouth daily. 2,400 mcg, Disp: , Rfl:    Vitamin D, Ergocalciferol, (DRISDOL) 1.25 MG (50000 UNIT) CAPS capsule, TAKE 1 CAPSULE BY MOUTH EVERY 7 DAYS, Disp: 12 capsule, Rfl: 1   vitamin E 600 UNIT capsule, Take 400 Units by mouth at bedtime., Disp: , Rfl:    ciclopirox (PENLAC) 8 % solution, Apply one coat to toenail qd.  Remove weekly with polish remover. (Patient not taking: Reported on 08/26/2023), Disp: 6.6 mL, Rfl: 11   Allergies  Allergen Reactions   Fish Allergy Hives    Cod   Latex Rash     Review of Systems  Constitutional: Negative.   Eyes:  Negative for blurred vision.  Respiratory: Negative.    Cardiovascular: Negative.  Negative for chest pain and palpitations.  Endocrine: Negative for polydipsia, polyphagia and polyuria.  Musculoskeletal:  Positive for arthralgias.       She c/o worsening hand pain. States her hands are stiff upon awakening. Stiffness usually improves later in the day. Denies known family h/o rheumatoid arthritis.  Neurological: Negative.  Negative for dizziness and headaches.  Psychiatric/Behavioral: Negative.       Today's Vitals   08/31/23 0932  BP: 124/72  Pulse: 64  Temp: 98.2 F (36.8 C)  SpO2: 98%  Weight: 175  lb 6.4 oz (79.6 kg)  Height: 5\' 4"  (1.626 m)   Body mass index is 30.11 kg/m.  Wt Readings from Last 3 Encounters:  08/31/23 175 lb 6.4 oz (79.6 kg)  08/26/23 177 lb (80.3 kg)  08/21/23 170 lb (77.1 kg)     Objective:  Physical Exam Vitals and nursing note reviewed.  Constitutional:      Appearance: Normal appearance.  HENT:     Head: Normocephalic and atraumatic.  Eyes:     Extraocular Movements: Extraocular movements intact.  Cardiovascular:     Rate and Rhythm: Normal rate and regular rhythm.     Heart sounds: Normal heart sounds.  Pulmonary:     Effort: Pulmonary effort is normal.     Breath sounds: Normal breath sounds.  Musculoskeletal:     Cervical back: Normal range of motion.     Comments: Pos squeeze test b/l  Skin:    General: Skin is warm.  Neurological:     General: No focal deficit present.     Mental Status: She is alert.  Psychiatric:        Mood and Affect: Mood normal.        Behavior: Behavior normal.         Assessment And Plan:  Type 2 diabetes mellitus with stage 2 chronic kidney disease, with long-term current use of insulin (HCC) Assessment & Plan: Chronic, she will c/w Farxiga 10mg  and Trulicity 1.5mg  weekly.  She also uses Humalog per sliding scale, states she rarely uses it when she is following recommended diet.  I will check labs and adjust meds as needed.   Orders: -     CMP14+EGFR -     Lipid panel -     Hemoglobin A1c  Hypertensive heart and renal disease with renal failure, stage 1 through stage 4 or unspecified chronic kidney disease, without heart failure Assessment & Plan: Chronic, fair control. Goal BP<120/80. She is encouraged to follow low sodium diet. She will c/w amlodipine 10mg  daily. We discussed the need for increased exercise. She is  encouraged to avoid adding salt to her foods.   Orders: -     CMP14+EGFR -     Lipid panel  Pure hypercholesterolemia Assessment & Plan: Chronic, LDL goal < 70.  She will c/w  rosuvastatin 20mg  daily. Encouraged to follow a heart healthy lifestyle.   Orders: -     CMP14+EGFR -     Lipid panel  Atherosclerosis of aorta (HCC) Assessment & Plan: Chronic, LDL goal < 70. She will c/w rosuvastatin 20mg  daily and ASA 81mg  daily. She is encouraged to follow a heart healthy lifestyle.    OSA and COPD overlap syndrome (HCC) Assessment & Plan: Chronic, she admits noncompliance w/ CPAP.  States she doesn't sleep well when using the CPAP.  She is encouraged to use CPAP at least four hours per night.    Class 1 obesity due to excess calories with serious comorbidity and body mass index (BMI) of 30.0 to 30.9 in adult Assessment & Plan: Her BMI is acceptable for her demographic. She is encouraged to aim for at least 150 minutes of exercise per week.    Pain in both hands Assessment & Plan: I will check an arthritis panel today and treat accordingly.  For now, she will apply Voltaren gel to affected areas twice daily prn. She may also take Tylenol prn.   Orders: -     ANA, IFA (with reflex) -     CYCLIC CITRUL PEPTIDE ANTIBODY, IGG/IGA -     Rheumatoid factor -     Sedimentation rate -     Uric acid     Return if symptoms worsen or fail to improve.  Patient was given opportunity to ask questions. Patient verbalized understanding of the plan and was able to repeat key elements of the plan. All questions were answered to their satisfaction.    I, [name], MD, have reviewed all documentation for this visit. The documentation on 08/31/23 for the exam, diagnosis, procedures, and orders are all accurate and complete.   IF YOU HAVE BEEN REFERRED TO A SPECIALIST, IT MAY TAKE 1-2 WEEKS TO SCHEDULE/PROCESS THE REFERRAL. IF YOU HAVE NOT HEARD FROM US/SPECIALIST IN TWO WEEKS, PLEASE GIVE [place] A CALL AT [phone] X [phone].   THE PATIENT IS ENCOURAGED TO PRACTICE SOCIAL DISTANCING DUE TO THE COVID-19 PANDEMIC.

## 2023-08-31 NOTE — Assessment & Plan Note (Signed)
 I will check an arthritis panel today and treat accordingly.  For now, she will apply Voltaren gel to affected areas twice daily prn. She may also take Tylenol prn.

## 2023-09-06 LAB — CMP14+EGFR
ALT: 15 IU/L (ref 0–32)
AST: 23 IU/L (ref 0–40)
Albumin: 4.4 g/dL (ref 3.8–4.8)
Alkaline Phosphatase: 186 IU/L — ABNORMAL HIGH (ref 44–121)
BUN/Creatinine Ratio: 12 (ref 12–28)
BUN: 9 mg/dL (ref 8–27)
Bilirubin Total: 0.3 mg/dL (ref 0.0–1.2)
CO2: 24 mmol/L (ref 20–29)
Calcium: 9.2 mg/dL (ref 8.7–10.3)
Chloride: 102 mmol/L (ref 96–106)
Creatinine, Ser: 0.75 mg/dL (ref 0.57–1.00)
Globulin, Total: 2.9 g/dL (ref 1.5–4.5)
Glucose: 186 mg/dL — ABNORMAL HIGH (ref 70–99)
Potassium: 4.3 mmol/L (ref 3.5–5.2)
Sodium: 141 mmol/L (ref 134–144)
Total Protein: 7.3 g/dL (ref 6.0–8.5)
eGFR: 85 mL/min/{1.73_m2} (ref >59)

## 2023-09-06 LAB — CYCLIC CITRUL PEPTIDE ANTIBODY, IGG/IGA: Cyclic Citrullin Peptide Ab: 5 U (ref 0–19)

## 2023-09-06 LAB — LIPID PANEL
Chol/HDL Ratio: 2.7 ratio (ref 0.0–4.4)
Cholesterol, Total: 157 mg/dL (ref 100–199)
HDL: 58 mg/dL (ref >39)
LDL Chol Calc (NIH): 82 mg/dL (ref 0–99)
Triglycerides: 94 mg/dL (ref 0–149)
VLDL Cholesterol Cal: 17 mg/dL (ref 5–40)

## 2023-09-06 LAB — FANA STAINING PATTERNS: Speckled Pattern: 1:640 {titer} — ABNORMAL HIGH

## 2023-09-06 LAB — HEMOGLOBIN A1C
Est. average glucose Bld gHb Est-mCnc: 171 mg/dL
Hgb A1c MFr Bld: 7.6 % — ABNORMAL HIGH (ref 4.8–5.6)

## 2023-09-06 LAB — ANTINUCLEAR ANTIBODIES, IFA: ANA Titer 1: POSITIVE — AB

## 2023-09-06 LAB — SEDIMENTATION RATE: Sed Rate: 20 mm/h (ref 0–40)

## 2023-09-06 LAB — URIC ACID: Uric Acid: 2.8 mg/dL — ABNORMAL LOW (ref 3.1–7.9)

## 2023-09-06 LAB — RHEUMATOID FACTOR: Rheumatoid fact SerPl-aCnc: 12.5 [IU]/mL (ref <14.0)

## 2023-09-08 ENCOUNTER — Other Ambulatory Visit: Payer: Self-pay | Admitting: Internal Medicine

## 2023-09-08 DIAGNOSIS — R748 Abnormal levels of other serum enzymes: Secondary | ICD-10-CM

## 2023-09-08 DIAGNOSIS — R768 Other specified abnormal immunological findings in serum: Secondary | ICD-10-CM

## 2023-09-08 DIAGNOSIS — M79642 Pain in left hand: Secondary | ICD-10-CM

## 2023-09-14 ENCOUNTER — Other Ambulatory Visit: Payer: Self-pay

## 2023-09-14 DIAGNOSIS — Z794 Long term (current) use of insulin: Secondary | ICD-10-CM

## 2023-09-14 MED ORDER — DEXCOM G7 SENSOR MISC: 3 refills | Status: DC

## 2023-09-17 ENCOUNTER — Other Ambulatory Visit

## 2023-09-17 DIAGNOSIS — R748 Abnormal levels of other serum enzymes: Secondary | ICD-10-CM | POA: Diagnosis not present

## 2023-09-20 DIAGNOSIS — E1165 Type 2 diabetes mellitus with hyperglycemia: Secondary | ICD-10-CM | POA: Diagnosis not present

## 2023-09-21 LAB — ALKALINE PHOSPHATASE, ISOENZYMES
Alkaline Phosphatase: 170 IU/L — ABNORMAL HIGH (ref 44–121)
BONE FRACTION: 51 % (ref 14–68)
INTESTINAL FRAC.: 24 % — ABNORMAL HIGH (ref 0–18)
LIVER FRACTION: 25 % (ref 18–85)

## 2023-10-19 ENCOUNTER — Encounter: Payer: Self-pay | Admitting: Internal Medicine

## 2023-11-08 ENCOUNTER — Other Ambulatory Visit: Payer: Self-pay | Admitting: Internal Medicine

## 2023-11-20 ENCOUNTER — Other Ambulatory Visit: Payer: Self-pay | Admitting: Cardiology

## 2023-11-20 ENCOUNTER — Other Ambulatory Visit: Payer: Self-pay | Admitting: Internal Medicine

## 2023-11-30 ENCOUNTER — Encounter: Payer: Self-pay | Admitting: Internal Medicine

## 2023-11-30 ENCOUNTER — Ambulatory Visit (INDEPENDENT_AMBULATORY_CARE_PROVIDER_SITE_OTHER): Payer: Medicare HMO | Admitting: Internal Medicine

## 2023-11-30 VITALS — BP 130/72 | HR 69 | Temp 98.2°F | Ht 64.0 in | Wt 174.2 lb

## 2023-11-30 DIAGNOSIS — M79641 Pain in right hand: Secondary | ICD-10-CM

## 2023-11-30 DIAGNOSIS — E559 Vitamin D deficiency, unspecified: Secondary | ICD-10-CM | POA: Diagnosis not present

## 2023-11-30 DIAGNOSIS — I1310 Hypertensive heart and chronic kidney disease without heart failure, with stage 1 through stage 4 chronic kidney disease, or unspecified chronic kidney disease: Secondary | ICD-10-CM | POA: Diagnosis not present

## 2023-11-30 DIAGNOSIS — N182 Chronic kidney disease, stage 2 (mild): Secondary | ICD-10-CM | POA: Diagnosis not present

## 2023-11-30 DIAGNOSIS — E663 Overweight: Secondary | ICD-10-CM

## 2023-11-30 DIAGNOSIS — Z794 Long term (current) use of insulin: Secondary | ICD-10-CM | POA: Diagnosis not present

## 2023-11-30 DIAGNOSIS — E7800 Pure hypercholesterolemia, unspecified: Secondary | ICD-10-CM

## 2023-11-30 DIAGNOSIS — M79642 Pain in left hand: Secondary | ICD-10-CM

## 2023-11-30 DIAGNOSIS — E1122 Type 2 diabetes mellitus with diabetic chronic kidney disease: Secondary | ICD-10-CM | POA: Diagnosis not present

## 2023-11-30 DIAGNOSIS — I700 Atherosclerosis of aorta: Secondary | ICD-10-CM | POA: Diagnosis not present

## 2023-11-30 DIAGNOSIS — R748 Abnormal levels of other serum enzymes: Secondary | ICD-10-CM | POA: Diagnosis not present

## 2023-11-30 DIAGNOSIS — Z6829 Body mass index (BMI) 29.0-29.9, adult: Secondary | ICD-10-CM

## 2023-11-30 DIAGNOSIS — B182 Chronic viral hepatitis C: Secondary | ICD-10-CM

## 2023-11-30 MED ORDER — NYSTATIN 100000 UNIT/GM EX POWD: Freq: Three times a day (TID) | CUTANEOUS | 0 refills | Status: AC

## 2023-11-30 NOTE — Assessment & Plan Note (Signed)
 I WILL CHECK A VIT D LEVEL AND SUPPLEMENT AS NEEDED.  ALSO ENCOURAGED TO SPEND 15 MINUTES IN THE SUN DAILY.

## 2023-11-30 NOTE — Assessment & Plan Note (Signed)
 Chronic, she has positive ANA. She has upcoming appt with Rheumatology in September 2025.  - I will check renal function and consider meloxicam  - Encouraged to call Rheum office to check for cancellations

## 2023-11-30 NOTE — Assessment & Plan Note (Addendum)
 Chronic, she will c/w Farxiga  10mg  and Trulicity  1.5mg  weekly.  She also uses Humalog  per sliding scale, states she rarely uses it when she is following recommended diet.  I will check labs and adjust meds as needed.  - She was given Dexcom sensor samples - CMA to review Dexcom use with patient - I will check labs as below - F/u in 3-4 months

## 2023-11-30 NOTE — Assessment & Plan Note (Signed)
 Chronic, diagnosed with hepatitis C virus many years ago. She was treated more than 10 years ago.

## 2023-11-30 NOTE — Assessment & Plan Note (Signed)
 Chronic, rosuvastatin  increased to 40mg  daily by Cardiology.

## 2023-11-30 NOTE — Assessment & Plan Note (Signed)
 Chronic, fair control. Goal BP<120/80. She is encouraged to follow low sodium diet. She will c/w amlodipine  10mg   and metoprolol  daily. We discussed the need for increased exercise. She is encouraged to avoid adding salt to her foods.

## 2023-11-30 NOTE — Progress Notes (Signed)
 I,Jameka J Llittleton, CMA,acting as a Neurosurgeon for Smiley Dung, MD.,have documented all relevant documentation on the behalf of Smiley Dung, MD,as directed by  Smiley Dung, MD while in the presence of Smiley Dung, MD.  Subjective:  Patient ID: [name] [name] , female    DOB: 02-Feb-1951 , 73 y.o.   MRN: [id number]  Chief Complaint  Patient presents with   Diabetes    Patient presents today for a diabetes, HTN & cholesterol check. Patient reports compliance with her meds. She denies headaches, chest pain and shortness of breath.  She has no specific concerns at this time.     Hypertension    HPI Discussed the use of AI scribe software for clinical note transcription with the patient, who gave verbal consent to proceed.  History of Present Illness [name] [name] is a 73 year old female with diabetes and hypertension who presents for a diabetes and blood pressure check.  She is experiencing issues with her continuous glucose monitor (CGM), the G7, which has been giving falsely elevated readings. Her blood sugar levels in the morning are typically around 110 mg/dL, sometimes dropping to 90 mg/dL. She has encountered faulty sensors that do not register, necessitating multiple replacements. Her current diabetes management includes Humalog  as needed for elevated blood sugars, Farxiga  10 mg, and Trulicity  1.5 mg.  For hypertension, she is taking metoprolol  50 mg twice a day and amlodipine  10 mg. She does not consume much salt and prefers natural seasonings. She attributes her current blood pressure reading to having consumed coffee earlier.  She experiences numbness and stiffness in her right hand, which she attributes to frequent use due to cooking and crafting. She has been using carpal tunnel gloves since May 20th to alleviate symptoms and takes Advil  occasionally for pain relief, ensuring not to use it daily.  Her current medications include trazodone 50 mg at night, rosuvastatin  40  mg, pregabalin  50 mg at bedtime, omeprazole  20 mg daily, and metoprolol  50 mg twice a day. She also uses nystatin  powder during the summer months.  She has a history of hepatitis C, diagnosed over ten years ago, and has undergone treatment with a liver specialist. She is allergic to latex and cod fish.  She mentions being active recently due to attending graduations and participating in a line dance exercise class.   Diabetes She presents for her follow-up diabetic visit. She has type 2 diabetes mellitus. Her disease course has been improving. Pertinent negatives for hypoglycemia include no dizziness or headaches. Pertinent negatives for diabetes include no blurred vision, no chest pain, no polydipsia, no polyphagia and no polyuria. There are no hypoglycemic complications. Risk factors for coronary artery disease include diabetes mellitus, dyslipidemia, hypertension and post-menopausal. Current diabetic treatment includes insulin  injections. She is compliant with treatment most of the time. She is following a diabetic diet. She participates in exercise intermittently. Eye exam is current.  Hypertension This is a chronic problem. The current episode started more than 1 year ago. The problem has been gradually improving since onset. The problem is controlled. Pertinent negatives include no blurred vision, chest pain, headaches or palpitations. Past treatments include angiotensin blockers. The current treatment provides moderate improvement. Compliance problems include exercise and diet.  Hypertensive end-organ damage includes kidney disease. Identifiable causes of hypertension include chronic renal disease.  Hyperlipidemia This is a chronic problem. The current episode started more than 1 year ago. Exacerbating diseases include chronic renal disease. Pertinent negatives include no chest pain. Current antihyperlipidemic  treatment includes statins. The current treatment provides moderate improvement of  lipids. Risk factors for coronary artery disease include diabetes mellitus, dyslipidemia, hypertension, post-menopausal and a sedentary lifestyle.     Past Medical History:  Diagnosis Date   CKD (chronic kidney disease), stage II    COPD (chronic obstructive pulmonary disease) (HCC)    Coronary artery disease 2005   cardiologist--- dr Danley Dusky;    cardiac cath for acute angina 05-21-2004 multivessel disease;  cath after GI consult 12/ 2005  PCTA  w/ DES to prox RCA ;  cath 04-20-2006 patent stent with nonobstruction CAD;  NUC 12/ 2013 normal no ischemia;  last ETT 07-30-2022 no ischemia   Diverticulosis of colon    Dyslipidemia    Full dentures    History of anemia    History of hepatitis C virus infection    per pt was dx many yrs ago and treated in 2017  (noted in epic HCV RNA quan not detectable 10-25-2019)   Hypertension    OSA (obstructive sleep apnea)    study in epic 03-06-2019  moderate osa , (08-14-2022  pt stated she tried  cpap  but just decided to stop using it)   Osteoarthritis    PAD (peripheral artery disease) (HCC)    Peripheral neuropathy    Prolapse of anterior vaginal wall    S/P primary angioplasty with coronary stent 05/2004   PTCA and DES x1 to pRCA   Type 2 diabetes mellitus treated with insulin  (HCC)    followed by pcp   (08-14-2022  per pt checks blood sugar multiple times dialy w/ dexcom 7,  fasting average 101)   Uterovaginal prolapse, incomplete    Wears glasses      Family History  Problem Relation Age of Onset   CAD Father 61   Heart attack Father    CAD Mother 63   Heart attack Mother    CAD Brother    Cancer Brother        prostate   CAD Sister    Cancer Sister        breast   CAD Brother      Current Outpatient Medications:    amLODipine  (NORVASC ) 10 MG tablet, TAKE 1 TABLET(10 MG) BY MOUTH DAILY, Disp: 90 tablet, Rfl: 1   Ascorbic Acid  (VITAMIN C ) 500 MG CHEW, Chew by mouth daily., Disp: , Rfl:    aspirin  EC 81 MG tablet, Take 81 mg  by mouth daily. Swallow whole., Disp: , Rfl:    Coenzyme Q10 (COQ-10) 100 MG CAPS, Take 1 tablet by mouth daily. , Disp: , Rfl:    Continuous Glucose Sensor (DEXCOM G7 SENSOR) MISC, Use to check blood sugar 3 times a day. Dx code e11.65, Disp: 3 each, Rfl: 3   Dulaglutide  (TRULICITY ) 1.5 MG/0.5ML SOPN, Inject 1.5 mg into the skin once a week. Tuesday, Disp: 6 mL, Rfl: 3   FARXIGA  10 MG TABS tablet, Take 10 mg by mouth daily., Disp: , Rfl:    Glucagon  (GVOKE HYPOPEN  2-PACK) 0.5 MG/0.1ML SOAJ, Use for blood sugars less than 60., Disp: 0.2 mL, Rfl: 3   glucosamine-chondroitin 500-400 MG tablet, Take 1 tablet by mouth daily., Disp: , Rfl:    glucose blood (ONETOUCH VERIO) test strip, CHECK BLOOD SUGAR THREE TIMES DAILY dx: e11.65, Disp: 150 each, Rfl: 11   insulin  lispro (HUMALOG  KWIKPEN) 100 UNIT/ML KwikPen, Inject subcutaneously 2 units if sugars are 150 - 199, 4 units 200 -249, 6 units 250- 299, 8  units 300 - 349, 10 units >350, Disp: 15 mL, Rfl: 0   Insulin  Pen Needle (BD PEN NEEDLE NANO 2ND GEN) 32G X 4 MM MISC, USE TO INJECT INSULIN  DAILY, Disp: 100 each, Rfl: 2   MAGNESIUM OXIDE PO, Take 400 mg by mouth at bedtime., Disp: , Rfl:    metoprolol  tartrate (LOPRESSOR ) 50 MG tablet, Take 50 mg by mouth 2 (two) times daily., Disp: , Rfl:    Misc Natural Products (NEURIVA) CAPS, Take 1 capsule by mouth daily., Disp: , Rfl:    mometasone  (NASONEX ) 50 MCG/ACT nasal spray, Place 2 sprays into the nose daily., Disp: 1 each, Rfl: 2   omeprazole  (PRILOSEC) 20 MG capsule, TAKE 1 CAPSULE BY MOUTH DAILY, Disp: 90 capsule, Rfl: 1   OneTouch Delica Lancets 33G MISC, USE AS DIRECTED TO CHECK BLOOD SUGAR THREE TIMES DAILY, Disp: 100 each, Rfl: 1   pregabalin  (LYRICA ) 50 MG capsule, Take 1 capsule (50 mg total) by mouth at bedtime. TAKE 1 CAPSULE(50 MG) BY MOUTH AT BEDTIME, Disp: 90 capsule, Rfl: 2   rosuvastatin  (CRESTOR ) 40 MG tablet, Take 1 tablet (40 mg total) by mouth daily., Disp: 90 tablet, Rfl: 3   traZODone  (DESYREL) 50 MG tablet, TAKE 1 TABLET BY MOUTH EVERY NIGHT AT BEDTIME, Disp: 90 tablet, Rfl: 1   Vitamin A 3 MG (10000 UT) TABS, Take 1 tablet by mouth daily. 2,400 mcg, Disp: , Rfl:    Vitamin D , Ergocalciferol , (DRISDOL ) 1.25 MG (50000 UNIT) CAPS capsule, TAKE 1 CAPSULE BY MOUTH EVERY 7 DAYS, Disp: 12 capsule, Rfl: 1   vitamin E  600 UNIT capsule, Take 400 Units by mouth at bedtime., Disp: , Rfl:    nystatin  powder, Apply topically 3 (three) times daily. As needed, Disp: 60 g, Rfl: 0   Allergies  Allergen Reactions   Fish Allergy Hives    Cod   Latex Rash     Review of Systems  Constitutional: Negative.   Eyes: Negative.  Negative for blurred vision.  Respiratory: Negative.    Cardiovascular: Negative.  Negative for chest pain and palpitations.  Gastrointestinal: Negative.   Endocrine: Negative for polydipsia, polyphagia and polyuria.  Musculoskeletal:  Positive for arthralgias.  Skin: Negative.   Neurological:  Negative for dizziness and headaches.  Psychiatric/Behavioral: Negative.       Today's Vitals   11/30/23 0822  BP: 130/72  Pulse: 69  Temp: 98.2 F (36.8 C)  TempSrc: Oral  Weight: 174 lb 3.2 oz (79 kg)  Height: 5\' 4"  (1.626 m)  PainSc: 0-No pain   Body mass index is 29.9 kg/m.  Wt Readings from Last 3 Encounters:  11/30/23 174 lb 3.2 oz (79 kg)  08/31/23 175 lb 6.4 oz (79.6 kg)  08/26/23 177 lb (80.3 kg)    The 10-year ASCVD risk score (Arnett DK, et al., 2019) is: 25.8%   Values used to calculate the score:     Age: 82 years     Sex: Female     Is Non-Hispanic African American: Yes     Diabetic: Yes     Tobacco smoker: No     Systolic Blood Pressure: 130 mmHg     Is BP treated: Yes     HDL Cholesterol: 58 mg/dL     Total Cholesterol: 157 mg/dL  Objective:  Physical Exam Vitals and nursing note reviewed.  Constitutional:      Appearance: Normal appearance.  HENT:     Head: Normocephalic and atraumatic.  Eyes:  Extraocular Movements:  Extraocular movements intact.  Cardiovascular:     Rate and Rhythm: Normal rate and regular rhythm.     Heart sounds: Normal heart sounds.  Pulmonary:     Effort: Pulmonary effort is normal.     Breath sounds: Normal breath sounds.  Musculoskeletal:     Cervical back: Normal range of motion.  Skin:    General: Skin is warm.  Neurological:     General: No focal deficit present.     Mental Status: She is alert.  Psychiatric:        Mood and Affect: Mood normal.        Behavior: Behavior normal.         Assessment And Plan:  Type 2 diabetes mellitus with stage 2 chronic kidney disease, with long-term current use of insulin  (HCC) Assessment & Plan: Chronic, she will c/w Farxiga  10mg  and Trulicity  1.5mg  weekly.  She also uses Humalog  per sliding scale, states she rarely uses it when she is following recommended diet.  I will check labs and adjust meds as needed.  - She was given Dexcom sensor samples - CMA to review Dexcom use with patient - I will check labs as below - F/u in 3-4 months  Orders: -     CBC -     CMP14+EGFR -     Hemoglobin A1c  Hypertensive heart and renal disease with renal failure, stage 1 through stage 4 or unspecified chronic kidney disease, without heart failure Assessment & Plan: Chronic, fair control. Goal BP<120/80. She is encouraged to follow low sodium diet. She will c/w amlodipine  10mg   and metoprolol  daily. We discussed the need for increased exercise. She is encouraged to avoid adding salt to her foods.    Atherosclerosis of aorta (HCC) Assessment & Plan: Chronic, LDL goal < 70. She will c/w rosuvastatin  40mg  daily and ASA 81mg  daily. She is encouraged to follow a heart healthy lifestyle.    Pure hypercholesterolemia Assessment & Plan: Chronic, rosuvastatin  increased to 40mg  daily by Cardiology.  Orders: -     Lipid panel  Pain in both hands Assessment & Plan: Chronic, she has positive ANA. She has upcoming appt with Rheumatology in  September 2025.  - I will check renal function and consider meloxicam  - Encouraged to call Rheum office to check for cancellations   Vitamin D  deficiency disease Assessment & Plan: I WILL CHECK A VIT D LEVEL AND SUPPLEMENT AS NEEDED.  ALSO ENCOURAGED TO SPEND 15 MINUTES IN THE SUN DAILY.   Orders: -     VITAMIN D  25 Hydroxy (Vit-D Deficiency, Fractures)  Overweight with body mass index (BMI) of 29 to 29.9 in adult Assessment & Plan: Her BMI is acceptable for her demographic. She is encouraged to aim for at least 150 minutes of exercise per week.    Hepatitis C carrier [facility]) Assessment & Plan: Chronic, diagnosed with hepatitis C virus many years ago. She was treated more than 10 years ago.   Other orders -     Nystatin ; Apply topically 3 (three) times daily. As needed  Dispense: 60 g; Refill: 0   Return if symptoms worsen or fail to improve.  Patient was given opportunity to ask questions. Patient verbalized understanding of the plan and was able to repeat key elements of the plan. All questions were answered to their satisfaction.    I, Smiley Dung, MD, have reviewed all documentation for this visit. The documentation on 11/30/23 for the exam, diagnosis, procedures, and  orders are all accurate and complete.   IF YOU HAVE BEEN REFERRED TO A SPECIALIST, IT MAY TAKE 1-2 WEEKS TO SCHEDULE/PROCESS THE REFERRAL. IF YOU HAVE NOT HEARD FROM US /SPECIALIST IN TWO WEEKS, PLEASE GIVE US  A CALL AT [phone].

## 2023-11-30 NOTE — Patient Instructions (Addendum)

## 2023-11-30 NOTE — Assessment & Plan Note (Signed)
 Chronic, LDL goal < 70. She will c/w rosuvastatin  40mg  daily and ASA 81mg  daily. She is encouraged to follow a heart healthy lifestyle.

## 2023-11-30 NOTE — Assessment & Plan Note (Signed)
Her BMI is acceptable for her demographic. She is encouraged to aim for at least 150 minutes of exercise per week.

## 2023-12-01 ENCOUNTER — Ambulatory Visit (INDEPENDENT_AMBULATORY_CARE_PROVIDER_SITE_OTHER): Admitting: Podiatry

## 2023-12-01 ENCOUNTER — Ambulatory Visit: Payer: Self-pay | Admitting: Internal Medicine

## 2023-12-01 DIAGNOSIS — Z91198 Patient's noncompliance with other medical treatment and regimen for other reason: Secondary | ICD-10-CM

## 2023-12-01 LAB — CBC
Hematocrit: 40.6 % (ref 34.0–46.6)
Hemoglobin: 12 g/dL (ref 11.1–15.9)
MCH: 22.5 pg — ABNORMAL LOW (ref 26.6–33.0)
MCHC: 29.6 g/dL — ABNORMAL LOW (ref 31.5–35.7)
MCV: 76 fL — ABNORMAL LOW (ref 79–97)
Platelets: 248 10*3/uL (ref 150–450)
RBC: 5.34 x10E6/uL — ABNORMAL HIGH (ref 3.77–5.28)
RDW: 16.5 % — ABNORMAL HIGH (ref 11.7–15.4)
WBC: 4.5 10*3/uL (ref 3.4–10.8)

## 2023-12-01 LAB — CMP14+EGFR
ALT: 13 IU/L (ref 0–32)
AST: 20 IU/L (ref 0–40)
Albumin: 4.3 g/dL (ref 3.8–4.8)
Alkaline Phosphatase: 161 IU/L — ABNORMAL HIGH (ref 44–121)
BUN/Creatinine Ratio: 17 (ref 12–28)
BUN: 12 mg/dL (ref 8–27)
Bilirubin Total: 0.3 mg/dL (ref 0.0–1.2)
CO2: 22 mmol/L (ref 20–29)
Calcium: 9.3 mg/dL (ref 8.7–10.3)
Chloride: 102 mmol/L (ref 96–106)
Creatinine, Ser: 0.7 mg/dL (ref 0.57–1.00)
Globulin, Total: 2.8 g/dL (ref 1.5–4.5)
Glucose: 150 mg/dL — ABNORMAL HIGH (ref 70–99)
Potassium: 4.2 mmol/L (ref 3.5–5.2)
Sodium: 141 mmol/L (ref 134–144)
Total Protein: 7.1 g/dL (ref 6.0–8.5)
eGFR: 91 mL/min/{1.73_m2} (ref >59)

## 2023-12-01 LAB — HEMOGLOBIN A1C
Est. average glucose Bld gHb Est-mCnc: 169 mg/dL
Hgb A1c MFr Bld: 7.5 % — ABNORMAL HIGH (ref 4.8–5.6)

## 2023-12-01 LAB — LIPID PANEL
Chol/HDL Ratio: 2.6 ratio (ref 0.0–4.4)
Cholesterol, Total: 154 mg/dL (ref 100–199)
HDL: 60 mg/dL (ref >39)
LDL Chol Calc (NIH): 76 mg/dL (ref 0–99)
Triglycerides: 101 mg/dL (ref 0–149)
VLDL Cholesterol Cal: 18 mg/dL (ref 5–40)

## 2023-12-01 LAB — VITAMIN D 25 HYDROXY (VIT D DEFICIENCY, FRACTURES): Vit D, 25-Hydroxy: 86.3 ng/mL (ref 30.0–100.0)

## 2023-12-06 NOTE — Progress Notes (Signed)
 1. Failure to attend appointment with reason given    Appointment canceled by patient.

## 2023-12-07 LAB — SPECIMEN STATUS REPORT

## 2023-12-07 LAB — ALKALINE PHOSPHATASE, ISOENZYMES
Alkaline Phosphatase: 153 IU/L — ABNORMAL HIGH (ref 44–121)
BONE FRACTION: 43 % (ref 14–68)
INTESTINAL FRAC.: 30 % — ABNORMAL HIGH (ref 0–18)
LIVER FRACTION: 27 % (ref 18–85)

## 2023-12-07 LAB — GAMMA GT: GGT: 9 IU/L (ref 0–60)

## 2023-12-19 DIAGNOSIS — E1165 Type 2 diabetes mellitus with hyperglycemia: Secondary | ICD-10-CM | POA: Diagnosis not present

## 2023-12-21 ENCOUNTER — Other Ambulatory Visit: Payer: Self-pay | Admitting: Internal Medicine

## 2023-12-21 NOTE — Telephone Encounter (Signed)
 Copied from CRM [id number]. Topic: Clinical - Medication Refill >> Dec 21, 2023  1:14 PM Sharnea C wrote: Medication:  Vitamin D , Ergocalciferol , (DRISDOL ) 1.25 MG (50000 UNIT) CAPS capsule and FARXIGA  10 MG TABS tablet (90 day supply)  Has the patient contacted their pharmacy? Yes (Agent: If no, request that the patient contact the pharmacy for the refill. If patient does not wish to contact the pharmacy document the reason why and proceed with request.) (Agent: If yes, when and what did the pharmacy advise?)  This is the patient's preferred pharmacy:  [id number] [address], [place] - [address] AT [facility] & [address] [place] [address] Phone: [phone] Fax: [phone]    Is this the correct pharmacy for this prescription? Yes If no, delete pharmacy and type the correct one.   Has the prescription been filled recently? No  Is the patient out of the medication? Yes  Has the patient been seen for an appointment in the last year OR does the patient have an upcoming appointment? Yes  Can we respond through MyChart? No  Agent: Please be advised that Rx refills may take up to 3 business days. We ask that you follow-up with your pharmacy.

## 2023-12-24 MED ORDER — VITAMIN D (ERGOCALCIFEROL) 1.25 MG (50000 UNIT) PO CAPS: 50000.0000 [IU] | ORAL_CAPSULE | ORAL | 1 refills | Status: DC

## 2023-12-24 MED ORDER — FARXIGA 10 MG PO TABS: 10.0000 mg | ORAL_TABLET | Freq: Every day | ORAL | 2 refills | Status: DC

## 2023-12-28 ENCOUNTER — Other Ambulatory Visit: Payer: Self-pay | Admitting: Internal Medicine

## 2023-12-28 DIAGNOSIS — Z1231 Encounter for screening mammogram for malignant neoplasm of breast: Secondary | ICD-10-CM

## 2023-12-30 ENCOUNTER — Ambulatory Visit (INDEPENDENT_AMBULATORY_CARE_PROVIDER_SITE_OTHER): Admitting: Internal Medicine

## 2023-12-30 ENCOUNTER — Encounter: Payer: Self-pay | Admitting: Internal Medicine

## 2023-12-30 VITALS — BP 122/80 | HR 65 | Temp 98.1°F | Ht 64.0 in | Wt 173.0 lb

## 2023-12-30 DIAGNOSIS — M7989 Other specified soft tissue disorders: Secondary | ICD-10-CM

## 2023-12-30 DIAGNOSIS — R768 Other specified abnormal immunological findings in serum: Secondary | ICD-10-CM

## 2023-12-30 DIAGNOSIS — N644 Mastodynia: Secondary | ICD-10-CM | POA: Diagnosis not present

## 2023-12-30 MED ORDER — DAPAGLIFLOZIN PROPANEDIOL 10 MG PO TABS: 10.0000 mg | ORAL_TABLET | Freq: Every day | ORAL | 2 refills | Status: DC

## 2023-12-30 MED ORDER — ROSUVASTATIN CALCIUM 40 MG PO TABS: 40.0000 mg | ORAL_TABLET | Freq: Every day | ORAL | 3 refills | Status: AC

## 2023-12-30 MED ORDER — INSULIN LISPRO (1 UNIT DIAL) 100 UNIT/ML (KWIKPEN): PEN_INJECTOR | SUBCUTANEOUS | 2 refills | Status: AC
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
## 2024-01-03 DIAGNOSIS — N644 Mastodynia: Secondary | ICD-10-CM | POA: Insufficient documentation

## 2024-01-03 DIAGNOSIS — M7989 Other specified soft tissue disorders: Secondary | ICD-10-CM | POA: Insufficient documentation

## 2024-01-03 DIAGNOSIS — R768 Other specified abnormal immunological findings in serum: Secondary | ICD-10-CM | POA: Insufficient documentation

## 2024-01-03 NOTE — Assessment & Plan Note (Signed)
 Large lesion is suggestive of lipoma on left upper back extending towards axilla, increasing in size. Causes cosmetic concern and discomfort. - Order ultrasound of the back to evaluate the lipoma.

## 2024-01-03 NOTE — Assessment & Plan Note (Signed)
 Positive ANA with generalized body aches. Referral to rheumatologist pending, appointment scheduled for September. Advised to seek earlier appointment if possible. - Encourage her to call rheumatologist for earlier appointment availability.

## 2024-01-03 NOTE — Assessment & Plan Note (Signed)
 Intermittent left breast pain, increasing in frequency, located on the lateral aspect. Diagnostic imaging required. - Order diagnostic mammogram of both breasts and and left breast ultrasound. - Coordinate with breast center for imaging appointments.

## 2024-01-03 NOTE — Patient Instructions (Signed)
 Lipoma  A lipoma is a noncancerous (benign) tumor that is made up of fat cells. This is a very common type of soft-tissue growth. Lipomas are usually found under the skin (subcutaneous). They may occur in any tissue of the body that contains fat. Common areas for lipomas to appear include the back, arms, shoulders, buttocks, and thighs. Lipomas grow slowly, and they are usually painless. Most lipomas do not cause problems and do not require treatment. What are the causes? The cause of this condition is not known. What increases the risk? You are more likely to develop this condition if: You are 18-42 years old. You have a family history of lipomas. What are the signs or symptoms? A lipoma usually appears as a small, round bump under the skin. In most cases, the lump will: Feel soft or rubbery. Not cause pain or other symptoms. However, if a lipoma is located in an area where it pushes on nerves, it can become painful or cause other symptoms. How is this diagnosed? A lipoma can usually be diagnosed with a physical exam. You may also have tests to confirm the diagnosis and to rule out other conditions. Tests may include: Imaging tests, such as a CT scan or an MRI. Removal of a tissue sample to be looked at under a microscope (biopsy). How is this treated? Treatment for this condition depends on the size of the lipoma and whether it is causing any symptoms. For small lipomas that are not causing problems, no treatment is needed. If a lipoma is bigger or it causes problems, surgery may be done to remove the lipoma. Lipomas can also be removed to improve appearance. Most often, the procedure is done after applying a medicine that numbs the area (local anesthetic). Liposuction may be done to reduce the size of the lipoma before it is removed through surgery, or it may be done to remove the lipoma. Lipomas are removed with this method to limit incision size and scarring. A liposuction tube is  inserted through a small incision into the lipoma, and the contents of the lipoma are removed through the tube with suction. Follow these instructions at home: Watch your lipoma for any changes. Keep all follow-up visits. This is important. Where to find more information OrthoInfo: orthoinfo.aaos.org Contact a health care provider if: Your lipoma becomes larger or hard. Your lipoma becomes painful, red, or increasingly swollen. These could be signs of infection or a more serious condition. Get help right away if: You develop tingling or numbness in an area near the lipoma. This could indicate that the lipoma is causing nerve damage. Summary A lipoma is a noncancerous tumor that is made up of fat cells. Most lipomas do not cause problems and do not require treatment. If a lipoma is bigger or it causes problems, surgery may be done to remove the lipoma. Contact a health care provider if your lipoma becomes larger or hard, or if it becomes painful, red, or increasingly swollen. These could be signs of infection or a more serious condition. This information is not intended to replace advice given to you by your health care provider. Make sure you discuss any questions you have with your health care provider. Document Revised: 06/28/2021 Document Reviewed: 06/28/2021 Elsevier Patient Education  2024 [employer].

## 2024-01-05 ENCOUNTER — Ambulatory Visit

## 2024-01-05 VITALS — BP 110/74 | HR 75 | Temp 98.1°F | Ht 64.0 in | Wt 173.0 lb

## 2024-01-05 DIAGNOSIS — E1122 Type 2 diabetes mellitus with diabetic chronic kidney disease: Secondary | ICD-10-CM

## 2024-01-05 NOTE — Progress Notes (Signed)
 Patient presents today to apply [place] sensor. She is changing from Dexcom. Patient agrees with this change. Patient aware if any questions or concerns, feel free to give the office a call.

## 2024-01-06 ENCOUNTER — Other Ambulatory Visit: Payer: Self-pay | Admitting: Internal Medicine

## 2024-01-18 ENCOUNTER — Other Ambulatory Visit: Payer: Self-pay | Admitting: Internal Medicine

## 2024-01-18 NOTE — Telephone Encounter (Unsigned)
 Copied from CRM [id number]. Topic: Clinical - Medication Refill >> Jan 18, 2024  3:18 PM Carlatta H wrote: Medication: pregabalin  (LYRICA ) 50 MG capsule  Has the patient contacted their pharmacy? No (Agent: If no, request that the patient contact the pharmacy for the refill. If patient does not wish to contact the pharmacy document the reason why and proceed with request.) (Agent: If yes, when and what did the pharmacy advise?)  This is the patient's preferred pharmacy:  [id number] [place], [place] - [address] AT [facility] OF [facility] & [address] [place] [address] Phone: [phone] Fax: [phone]  Is this the correct pharmacy for this prescription? Yes If no, delete pharmacy and type the correct one.   Has the prescription been filled recently? No  Is the patient out of the medication? Yes  Has the patient been seen for an appointment in the last year OR does the patient have an upcoming appointment? Yes  Can we respond through MyChart? Yes  Agent: Please be advised that Rx refills may take up to 3 business days. We ask that you follow-up with your pharmacy.

## 2024-01-19 ENCOUNTER — Other Ambulatory Visit: Payer: Self-pay | Admitting: Nurse Practitioner

## 2024-01-19 MED ORDER — PREGABALIN 50 MG PO CAPS: 50.0000 mg | ORAL_CAPSULE | Freq: Every day | ORAL | 2 refills | Status: DC

## 2024-01-19 MED ORDER — PREGABALIN 50 MG PO CAPS: 50.0000 mg | ORAL_CAPSULE | Freq: Every day | ORAL | 2 refills | Status: AC

## 2024-01-20 ENCOUNTER — Encounter: Payer: Self-pay | Admitting: Podiatry

## 2024-01-20 ENCOUNTER — Ambulatory Visit (INDEPENDENT_AMBULATORY_CARE_PROVIDER_SITE_OTHER): Admitting: Podiatry

## 2024-01-20 DIAGNOSIS — B351 Tinea unguium: Secondary | ICD-10-CM | POA: Diagnosis not present

## 2024-01-20 DIAGNOSIS — E119 Type 2 diabetes mellitus without complications: Secondary | ICD-10-CM | POA: Diagnosis not present

## 2024-01-20 DIAGNOSIS — M79674 Pain in right toe(s): Secondary | ICD-10-CM

## 2024-01-20 DIAGNOSIS — M79675 Pain in left toe(s): Secondary | ICD-10-CM

## 2024-01-23 ENCOUNTER — Encounter: Payer: Self-pay | Admitting: Podiatry

## 2024-01-23 NOTE — Progress Notes (Signed)
  Subjective:  Patient ID: [name] [name], female    DOB: [birth year],  MRN: [id number]  [name] [name] presents to clinic today for preventative diabetic foot care and painful thick toenails that are difficult to trim. Pain interferes with ambulation. Aggravating factors include wearing enclosed shoe gear. Pain is relieved with periodic professional debridement. Patient states she has not heard from [facility] regarding her diabetic shoes.  Chief Complaint  Patient presents with   Diabetes    DFC IDDM A1C 7.1 Toenail trim. LOV with PCP 12/2023.   New problem(s): None.   PCP is Jarold Medici, MD.  Allergies  Allergen Reactions   Fish Allergy Hives    Cod   Latex Rash    Review of Systems: Negative except as noted in the HPI.  Objective: No changes noted in today's physical examination. There were no vitals filed for this visit. [name] [name] is a pleasant 73 y.o. female WD, WN in NAD. AAO x 3.  Vascular Examination: Capillary refill time immediate b/l. Palpable pedal pulses. Pedal hair present b/l. Pedal edema absent. No pain with calf compression b/l. Skin temperature gradient WNL b/l. No cyanosis or clubbing b/l. No ischemia or gangrene noted b/l.   Neurological Examination: Sensation grossly intact b/l with 10 gram monofilament. Vibratory sensation intact b/l.   Dermatological Examination: Pedal skin with normal turgor, texture and tone b/l.  No open wounds. No interdigital macerations.   Toenails 1-5 b/l thick, discolored, elongated with subungual debris and pain on dorsal palpation.   No corns, calluses nor porokeratotic lesions noted.  Musculoskeletal Examination: Muscle strength 5/5 to all lower extremity muscle groups bilaterally. Hallux valgus with bunion deformity noted right foot. Hammertoe(s) 2-5 b/l. Pes planus deformity noted bilateral LE.SABRA No pain, crepitus or joint limitation noted with ROM b/l LE.  Patient ambulates independently without  assistive aids.  Radiographs: None  Last A1c:      Latest Ref Rng & Units 11/30/2023    9:01 AM 08/31/2023   10:24 AM 05/28/2023   10:20 AM  Hemoglobin A1C  Hemoglobin-A1c 4.8 - 5.6 % 7.5  7.6  7.1    Assessment/Plan: 1. Pain due to onychomycosis of toenails of both feet   2. Diabetes mellitus without complication (HCC)   -Patient was evaluated today. All questions/concerns addressed on today's visit. -Patient to continue soft, supportive shoe gear daily. Patient needs an update from [facility] regarding her diabetic shoes. -Mycotic toenails 1-5 bilaterally were debrided in length and girth with sterile nail nippers and dremel without incident. -Patient/POA to call should there be question/concern in the interim.   Return in about 3 months (around 04/21/2024).  Delon LITTIE Merlin, DPM      [facility] LOCATION: [address], [place] [address]                   Office [phone]   [facility] LOCATION: [address] [place], [place] [address] Office [phone]

## 2024-02-05 ENCOUNTER — Ambulatory Visit: Payer: Self-pay | Admitting: Internal Medicine

## 2024-02-05 ENCOUNTER — Ambulatory Visit
Admission: RE | Admit: 2024-02-05 | Discharge: 2024-02-05 | Disposition: A | Discharge status: Home / Self Care | Source: Ambulatory Visit | Attending: Internal Medicine | Admitting: Internal Medicine

## 2024-02-05 DIAGNOSIS — N644 Mastodynia: Secondary | ICD-10-CM

## 2024-02-05 DIAGNOSIS — R928 Other abnormal and inconclusive findings on diagnostic imaging of breast: Secondary | ICD-10-CM | POA: Diagnosis not present

## 2024-02-15 ENCOUNTER — Other Ambulatory Visit: Payer: Self-pay | Admitting: Internal Medicine

## 2024-02-18 ENCOUNTER — Other Ambulatory Visit: Payer: Self-pay

## 2024-02-18 DIAGNOSIS — N182 Chronic kidney disease, stage 2 (mild): Secondary | ICD-10-CM

## 2024-02-18 MED ORDER — FREESTYLE LIBRE 3 PLUS SENSOR MISC: 2 refills | Status: DC

## 2024-02-25 NOTE — Progress Notes (Signed)
 Office Visit Note  Patient: [name] [name]             Date of Birth: [birth year]           MRN: [id number]             PCP: Jarold Medici, MD Referring: Jarold Medici, MD Visit Date: 03/07/2024 Occupation: @GUAROCC @  Subjective:  Pain in multiple joints and positive ANA  History of Present Illness: [name] [name] is a 73 y.o. female seen for the evaluation of arthralgias and positive ANA.  According the patient her symptoms started in 2010 with pain in her knee joints and hands.  Patient states she was evaluated by me at the time and was diagnosed with osteoarthritis however I could not find any records.  Patient states that she used arthritis gloves and other devices for pain relief.  She states she started walking and always walked for several miles in her neighborhood.  Few years back she started having increased pain in her right knee joint and give out sensation.  She was evaluated at [facility] where she was diagnosed with osteoarthritis.  She recalls having cortisone injection to her right knee joint in her right shoulder joint about 4 years ago.  She also had physical therapy which was helpful.  She complains of ongoing pain and discomfort in her upper back, right shoulder, right wrist, both hands, right knee, both ankles.  She notices some fluid retention in her legs towards the end of the day.  None of the other joints are swollen.  She states she cooks and stands all day.  She recently had labs done by her PCP which showed positive ANA for that reason she was referred to me.She denies history of fatigue, oral ulcers, nasal ulcers, malar rash, photosensitivity, hair loss or swollen glands.  She states her muscles are sore all over. 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  She also is very active at the church she enjoys dancing at [employer].  She enjoys yard work and does walk for exercise.  She is single, gravida 4, para 2, miscarriages 2.  There is no history  of preeclampsia or DVTs.  She does not drink any alcohol.  She quit smoking in 2015.  She started smoking at age 27 and smoked about 1 pack/week.    Activities of Daily Living:  Patient reports morning stiffness for 10-15 minutes.   Patient Reports nocturnal pain.  Difficulty dressing/grooming: Denies Difficulty climbing stairs: Reports Difficulty getting out of chair: Reports Difficulty using hands for taps, buttons, cutlery, and/or writing: Reports  Review of Systems  Constitutional:  Negative for fatigue.  HENT:  Negative for mouth sores and mouth dryness.   Eyes:  Positive for dryness.  Respiratory:  Negative for shortness of breath.   Cardiovascular:  Negative for chest pain and palpitations.  Gastrointestinal:  Negative for blood in stool, constipation and diarrhea.  Endocrine: Negative for increased urination.  Genitourinary:  Negative for involuntary urination.  Musculoskeletal:  Positive for joint pain, gait problem, joint pain, joint swelling, myalgias, morning stiffness and myalgias. Negative for muscle weakness and muscle tenderness.  Skin:  Negative for color change, rash, hair loss and sensitivity to sunlight.  Allergic/Immunologic: Negative for susceptible to infections.  Neurological:  Positive for dizziness and headaches.  Hematological:  Negative for swollen glands.  Psychiatric/Behavioral:  Positive for sleep disturbance. Negative for depressed mood. The patient is not nervous/anxious.     PMFS History:  Patient  Active Problem List   Diagnosis Date Noted   Chronic insomnia 03/05/2024   Incontinence of feces 03/05/2024   Encounter for annual health examination 02/29/2024   Mastodynia of left breast 01/03/2024   Soft tissue mass 01/03/2024   Positive ANA (antinuclear antibody) 01/03/2024   Vitamin D  deficiency disease 11/30/2023   Pain in both hands 08/31/2023   Gastroesophageal reflux disease without esophagitis 05/28/2023   Sore throat 03/03/2023    Postnasal drip 03/03/2023   Supraumbilical hernia without gangrene and without obstruction 01/08/2023   Overweight with body mass index (BMI) of 29 to 29.9 in adult 01/08/2023   Atherosclerosis of aorta (HCC) 12/14/2022   Chronic renal disease, stage II 12/14/2022   Pure hypercholesterolemia 04/30/2022   Female genital prolapse 04/30/2022   Constipation 12/16/2021   Pelvic relaxation 10/17/2021   Parenchymal renal hypertension 07/04/2020   Educated about COVID-19 virus infection 03/25/2020   OSA and COPD overlap syndrome (HCC) 03/14/2019   Snoring 02/17/2019   Peripheral arterial disease (HCC) 01/26/2019   Hepatitis C carrier (HCC) 01/26/2019   Hypertensive heart and renal disease 08/19/2018   Class 1 obesity due to excess calories with serious comorbidity and body mass index (BMI) of 30.0 to 30.9 in adult 08/19/2018   Paresthesia of left upper extremity 06/29/2018   Atherosclerosis of native coronary artery of native heart with angina pectoris (HCC) 04/09/2018   Dyslipidemia 04/09/2018   Primary osteoarthritis of both feet 11/05/2017   Primary osteoarthritis of both hands 10/30/2017   Primary osteoarthritis of both knees 10/30/2017   Primary insomnia 10/30/2017   Family history of lupus erythematosus 10/30/2017   Dyspnea on exertion 12/10/2016   Type 2 diabetes mellitus with stage 2 chronic kidney disease, with long-term current use of insulin  (HCC) 09/21/2014   Acute cystitis 09/21/2014   Hyperglycemia 09/19/2014   Bradycardia 09/19/2014   Hyponatremia 09/19/2014   Elevated alkaline phosphatase level 09/19/2014   Acute kidney injury [facility])    Essential hypertension, benign 07/05/2008   Coronary atherosclerosis 07/05/2008    Past Medical History:  Diagnosis Date   CKD (chronic kidney disease), stage II    COPD (chronic obstructive pulmonary disease) (HCC)    Coronary artery disease 2005   cardiologist--- dr edison;    cardiac cath for acute angina 05-21-2004 multivessel  disease;  cath after GI consult 12/ 2005  PCTA  w/ DES to prox RCA ;  cath 04-20-2006 patent stent with nonobstruction CAD;  NUC 12/ 2013 normal no ischemia;  last ETT 07-30-2022 no ischemia   Diverticulosis of colon    Dyslipidemia    Full dentures    History of anemia    History of hepatitis C virus infection    per pt was dx many yrs ago and treated in 2017  (noted in epic HCV RNA quan not detectable 10-25-2019)   Hypertension    OSA (obstructive sleep apnea)    study in epic 03-06-2019  moderate osa , (08-14-2022  pt stated she tried  cpap  but just decided to stop using it)   Osteoarthritis    PAD (peripheral artery disease) (HCC)    Peripheral neuropathy    Prolapse of anterior vaginal wall    S/P primary angioplasty with coronary stent 05/2004   PTCA and DES x1 to pRCA   Type 2 diabetes mellitus treated with insulin  (HCC)    followed by pcp   (08-14-2022  per pt checks blood sugar multiple times dialy w/ dexcom 7,  fasting average 101)  Uterovaginal prolapse, incomplete    Wears glasses     Family History  Problem Relation Age of Onset   CAD Mother 74   Heart attack Mother    CAD Father 48   Heart attack Father    CAD Sister    Cancer Sister    Heart disease Sister    CAD Brother    Cancer Brother        prostate   Heart attack Brother    CAD Brother    Drug abuse Granddaughter    Other Son        MVA   Lupus Daughter    Past Surgical History:  Procedure Laterality Date   ANTERIOR AND POSTERIOR REPAIR WITH SACROSPINOUS FIXATION N/A 08/18/2022   Procedure: ANTERIOR AND POSTERIOR REPAIR, PERINEORRHAPHY WITH SACROSPINOUS FIXATION;  Surgeon: Marilynne Rosaline SAILOR, MD;  Location: [facility] [place];  Service: Gynecology;  Laterality: N/A;  Total time requested is 1.5hrs   APPENDECTOMY  2003   BREAST EXCISIONAL BIOPSY Right    age 69s;   benign   CARDIAC CATHETERIZATION  05/21/2004   @[facility]  by dr s. debera;   acute angina;   multivessel disease;   pRCA 80%,   dRCA 50-60%,  dLM 40%,  ostialLAD 50-60%,  pCfx 30%,  LVEF 60%   CARDIAC CATHETERIZATION  04/20/2006   @[facility]  by dr lelon. wyn;  angina;  patent RCA stent,  minimal nonobstructive coronary disease   CARPAL TUNNEL RELEASE Right 2005   CATARACT EXTRACTION W/ INTRAOCULAR LENS IMPLANT Bilateral 2022   COLONOSCOPY WITH PROPOFOL   10/12/2019   dr saintclair   CORONARY ANGIOPLASTY WITH STENT PLACEMENT  05/24/2004   @[facility]  by dr lelon. wyn;  after GI consult for bleed;   PTCA and DES x1  pRCA   CYSTOSCOPY N/A 08/18/2022   Procedure: CYSTOSCOPY;  Surgeon: Marilynne Rosaline SAILOR, MD;  Location: [facility];  Service: Gynecology;  Laterality: N/A;   TONSILLECTOMY     yrs ago   Social History   Social History Narrative   Lives alone.   One son died in a car accident.     Coffee in am ,1 cup   Immunization History  Administered Date(s) Administered   Fluad Quad(high Dose 65+) 03/04/2019, 03/10/2022, 03/09/2023   INFLUENZA, HIGH DOSE SEASONAL PF 03/21/2017, 03/11/2018, 02/29/2024   Influenza-Unspecified 03/07/2020, 03/13/2021   PFIZER(Purple Top)SARS-COV-2 Vaccination 07/30/2019, 08/20/2019, 04/16/2020, 11/23/2020, 05/01/2021   PNEUMOCOCCAL CONJUGATE-20 04/27/2021   Pneumococcal Conjugate-13 10/23/2016   Tdap 12/15/2014   Unspecified SARS-COV-2 Vaccination 03/10/2022, 03/09/2023   Zoster Recombinant(Shingrix) 01/25/2019, 03/30/2019     Objective: Vital Signs: BP 134/77   Pulse (!) 56   Temp 97.9 F (36.6 C)   Resp 16   Ht 5' 4 (1.626 m)   Wt 173 lb 3.2 oz (78.6 kg)   BMI 29.73 kg/m    Physical Exam Vitals and nursing note reviewed.  Constitutional:      Appearance: She is well-developed.  HENT:     Head: Normocephalic and atraumatic.  Eyes:     Conjunctiva/sclera: Conjunctivae normal.  Cardiovascular:     Rate and Rhythm: Normal rate and regular rhythm.     Heart sounds: Normal heart sounds.  Pulmonary:     Effort: Pulmonary effort is normal.     Breath sounds: Normal  breath sounds.  Abdominal:     General: Bowel sounds are normal.     Palpations: Abdomen is soft.  Musculoskeletal:     Cervical back: Normal  range of motion.  Lymphadenopathy:     Cervical: No cervical adenopathy.  Skin:    General: Skin is warm and dry.     Capillary Refill: Capillary refill takes less than 2 seconds.  Neurological:     Mental Status: She is alert and oriented to person, place, and time.  Psychiatric:        Behavior: Behavior normal.      Musculoskeletal Exam: Cervical spine was in good range of motion without discomfort.  She had no tenderness over thoracic or lumbar spine.  Shoulders, elbows, wrist joints with good range of motion.  She had bilateral CMC thickening.  She had bilateral DIP thickening.  No synovitis was noted.  Hip joints were in good range of motion.  Knee joints in good range of motion without any warmth swelling or effusion.  She had some discomfort range of motion of her right knee joint.  Hip joints with good range of motion without any warmth swelling or effusion.  She had bilateral mild bunions and mild PIP and DIP thickening without synovitis.  CDAI Exam: CDAI Score: -- Patient Global: --; Provider Global: -- Swollen: --; Tender: -- Joint Exam 03/07/2024   No joint exam has been documented for this visit   There is currently no information documented on the homunculus. Go to the Rheumatology activity and complete the homunculus joint exam.  Investigation: No additional findings.  Imaging: No results found.   Recent Labs: Lab Results  Component Value Date   WBC 4.5 11/30/2023   HGB 12.0 11/30/2023   PLT 248 11/30/2023   NA 142 03/01/2024   K 4.1 03/01/2024   CL 104 03/01/2024   CO2 24 03/01/2024   GLUCOSE 156 (H) 03/01/2024   BUN 9 03/01/2024   CREATININE 0.79 03/01/2024   BILITOT 0.3 03/01/2024   ALKPHOS 163 (H) 03/01/2024   AST 17 03/01/2024   ALT 10 03/01/2024   PROT 6.9 03/01/2024   ALBUMIN 4.2 03/01/2024    CALCIUM  9.3 03/01/2024   GFRAA 87 07/04/2020   August 31, 2023 lipid panel normal, hemoglobin A1c 7.6, ANA 1: 640 NS, RF negative, anti-CCP negative, sed rate 20, uric acid 2.8 November 30, 2023 alkaline phosphatase 153 (intestinal fraction 30), vitamin D  86.3  Speciality Comments: No specialty comments available.  Procedures:  No procedures performed Allergies: Fish allergy and Latex   Assessment / Plan:     Visit Diagnoses: Positive ANA (antinuclear antibody) -patient was found to have positive ANA during the recent evaluation by Dr. Jarold.  She denies any history of fatigue, oral ulcers, nasal ulcers, sicca symptoms, malar rash, photosensitivity, Raynaud's, lymphadenopathy.  She gives history of arthralgias.  She also gives history of intermittent joint swelling.  No synovitis was noted on the examination.  I will obtain additional labs.  Plan: Sedimentation rate, ANA, Anti-scleroderma antibody, RNP Antibody, Anti-Smith antibody, Sjogrens syndrome-A extractable nuclear antibody, Sjogrens syndrome-B extractable nuclear antibody, Anti-DNA antibody, double-stranded, C3 and C4, Beta-2  glycoprotein antibodies, Cardiolipin antibodies, IgG, IgM, IgA, Lupus Anticoagulant Eval w/Reflex, Glucose 6 phosphate dehydrogenase, Serum protein electrophoresis with reflex  Pain in both hands -she complains of pain and discomfort in the bilateral hands.  Bilateral CMC and DIP thickening with no synovitis was noted.  Plan: XR Hand 2 View Right, XR Hand 2 View Left.  X-rays showed bilateral CMC, PIP and DIP narrowing with left severe CMC narrowing and subluxation.  Right 2nd and 3rd MCP joint narrowing and calcification about third MCP joint was noted.  These findings suggestive of osteoarthritis and possible crystal induced arthropathy.  Primary osteoarthritis of both hands-please call findings suggestive of osteoarthritis.  Joint protection muscle strengthening was discussed.  Primary osteoarthritis of both knees  -patient gives history of osteoarthritis in her knee joints for many years.  She was under care of [facility] for right knee joint pain.  She had right knee joint injections in the past.  No warmth swelling or effusion was noted.  She had discomfort range of motion of her right knee joint.  Plan: XR KNEE 3 VIEW RIGHT, XR KNEE 3 VIEW LEFT.  X-rays were suggestive of severe osteoarthritis and severe chondromalacia patella.  A handout on lower extremity muscle strengthening exercise were given.  Primary osteoarthritis of both feet-bilateral bunions, PIP and DIP thickening was noted.  Family history of lupus erythematosus-daughter  Essential hypertension, benign-blood pressure was normal today.  Dyslipidemia  Type 2 diabetes mellitus with stage 2 chronic kidney disease, with long-term current use of insulin  (HCC)  Chronic renal disease, stage II  Peripheral arterial disease (HCC)  Atherosclerosis of native coronary artery of native heart with angina pectoris (HCC)  Elevated alkaline phosphatase level  Gastroesophageal reflux disease without esophagitis  Primary insomnia  Hepatitis C carrier (HCC)  Vitamin D  deficiency disease  Snoring  OSA and COPD overlap syndrome (HCC) - Not using CPAP.  Overweight with body mass index (BMI) of 29 to 29.9 in adult  Former smoker - She smoked 1 pack/week since she was 73 years old and quit smoking in 2015.  Family history of multiple sclerosis-niece  Orders: Orders Placed This Encounter  Procedures   XR Hand 2 View Right   XR Hand 2 View Left   XR KNEE 3 VIEW RIGHT   XR KNEE 3 VIEW LEFT   Sedimentation rate   ANA   Anti-scleroderma antibody   RNP Antibody   Anti-Smith antibody   Sjogrens syndrome-A extractable nuclear antibody   Sjogrens syndrome-B extractable nuclear antibody   Anti-DNA antibody, double-stranded   C3 and C4   Beta-2  glycoprotein antibodies   Cardiolipin antibodies, IgG, IgM, IgA   Lupus Anticoagulant Eval  w/Reflex   Glucose 6 phosphate dehydrogenase   Serum protein electrophoresis with reflex   No orders of the defined types were placed in this encounter.    Follow-Up Instructions: Return for Polyarthralgia, positive ANA.   Maya Nash, MD  Note - This record has been created using Animal nutritionist.  Chart creation errors have been sought, but may not always  have been located. Such creation errors do not reflect on  the standard of medical care.

## 2024-02-27 ENCOUNTER — Other Ambulatory Visit: Payer: Self-pay | Admitting: Internal Medicine

## 2024-02-29 ENCOUNTER — Ambulatory Visit: Payer: Self-pay | Admitting: Internal Medicine

## 2024-02-29 ENCOUNTER — Other Ambulatory Visit: Payer: Self-pay

## 2024-02-29 ENCOUNTER — Encounter: Payer: Self-pay | Admitting: Internal Medicine

## 2024-02-29 ENCOUNTER — Ambulatory Visit: Payer: Medicare HMO | Admitting: Internal Medicine

## 2024-02-29 VITALS — BP 110/70 | HR 72 | Temp 98.4°F | Ht 64.0 in | Wt 175.0 lb

## 2024-02-29 DIAGNOSIS — E7800 Pure hypercholesterolemia, unspecified: Secondary | ICD-10-CM

## 2024-02-29 DIAGNOSIS — E1122 Type 2 diabetes mellitus with diabetic chronic kidney disease: Secondary | ICD-10-CM

## 2024-02-29 DIAGNOSIS — Z0000 Encounter for general adult medical examination without abnormal findings: Secondary | ICD-10-CM | POA: Diagnosis not present

## 2024-02-29 DIAGNOSIS — I1310 Hypertensive heart and chronic kidney disease without heart failure, with stage 1 through stage 4 chronic kidney disease, or unspecified chronic kidney disease: Secondary | ICD-10-CM | POA: Diagnosis not present

## 2024-02-29 DIAGNOSIS — R159 Full incontinence of feces: Secondary | ICD-10-CM

## 2024-02-29 DIAGNOSIS — E66811 Obesity, class 1: Secondary | ICD-10-CM

## 2024-02-29 DIAGNOSIS — M7989 Other specified soft tissue disorders: Secondary | ICD-10-CM

## 2024-02-29 DIAGNOSIS — Z6830 Body mass index (BMI) 30.0-30.9, adult: Secondary | ICD-10-CM

## 2024-02-29 DIAGNOSIS — F5104 Psychophysiologic insomnia: Secondary | ICD-10-CM

## 2024-02-29 DIAGNOSIS — R748 Abnormal levels of other serum enzymes: Secondary | ICD-10-CM

## 2024-02-29 DIAGNOSIS — Z794 Long term (current) use of insulin: Secondary | ICD-10-CM | POA: Diagnosis not present

## 2024-02-29 DIAGNOSIS — Z23 Encounter for immunization: Secondary | ICD-10-CM | POA: Diagnosis not present

## 2024-02-29 DIAGNOSIS — N182 Chronic kidney disease, stage 2 (mild): Secondary | ICD-10-CM

## 2024-02-29 DIAGNOSIS — E6609 Other obesity due to excess calories: Secondary | ICD-10-CM

## 2024-02-29 LAB — POCT URINALYSIS DIP (CLINITEK)
Bilirubin, UA: NEGATIVE
Blood, UA: NEGATIVE
Glucose, UA: >=1000 mg/dL — AB
Ketones, POC UA: NEGATIVE mg/dL
Leukocytes, UA: NEGATIVE
Nitrite, UA: NEGATIVE
POC PROTEIN,UA: NEGATIVE
Spec Grav, UA: 1.015 (ref 1.010–1.025)
Urobilinogen, UA: 0.2 U/dL
pH, UA: 7 (ref 5.0–8.0)

## 2024-02-29 MED ORDER — FREESTYLE LIBRE 3 PLUS SENSOR MISC: 2 refills | Status: DC

## 2024-02-29 MED ORDER — VITAMIN D (ERGOCALCIFEROL) 1.25 MG (50000 UNIT) PO CAPS
50000.0000 [IU] | ORAL_CAPSULE | ORAL | 1 refills | Status: AC
Start: 2024-02-29 — End: ?

## 2024-02-29 MED ORDER — FARXIGA 10 MG PO TABS: 10.0000 mg | ORAL_TABLET | Freq: Every day | ORAL | 2 refills | Status: AC

## 2024-02-29 NOTE — Patient Instructions (Signed)

## 2024-02-29 NOTE — Progress Notes (Signed)
 I,Victoria T Emmitt, CMA,acting as a neurosurgeon for Catheryn LOISE Slocumb, MD.,have documented all relevant documentation on the behalf of Catheryn LOISE Slocumb, MD,as directed by  Catheryn LOISE Slocumb, MD while in the presence of Catheryn LOISE Slocumb, MD.  Subjective:    Patient ID: [name] [name] , female    DOB: 11/13/1950 , 73 y.o.   MRN: [id number]  Chief Complaint  Patient presents with   Annual Exam    Patient presents today for annual exam. She reports compliance with medications. Denies headache, chest pain & sob.    Diabetes   Hypertension   Hyperlipidemia    HPI Discussed the use of AI scribe software for clinical note transcription with the patient, who gave verbal consent to proceed.  History of Present Illness Moneka Mcquinn is a 73 year old female who presents for a routine physical exam and diabetes management.  Blood sugar levels are generally well-controlled, with morning readings typically between 105 and 115 mg/dL. She reports that she very seldom needs to take her Humalog . There was a lapse in her Farxiga  medication due to pharmacy issues. She is currently taking Trulicity  1.5 mg once a week.  She is on multiple medications including amlodipine  10 mg for blood pressure, aspirin , metoprolol  50 mg twice a day, Lyrica  50 mg at bedtime, rosuvastatin  40 mg daily for cholesterol, and trazodone for sleep. She reports that she has to walk for a long time before she is able to fall asleep.  She has a history of bladder surgery, after which she has experienced bowel incontinence. She has bowel movements every day, sometimes more frequently, and experiences occasional stomach pain and cramping.  She quit smoking in 2015 and underwent a CT of the chest in March 2023, which showed plaque in the aorta and heart arteries but no lung nodules. She engages in chair yoga and plans to start swimming at the [facility] with a neighbor.  She reports a growth on her shoulder, which she describes as a lump. She recalls  having a diagnostic mammogram in August and a bone density test in March. An ultrasound of the kidneys was ordered.   Diabetes She presents for her follow-up diabetic visit. She has type 2 diabetes mellitus. Her disease course has been improving. Pertinent negatives for hypoglycemia include no dizziness or headaches. Pertinent negatives for diabetes include no blurred vision, no chest pain, no polydipsia, no polyphagia and no polyuria. There are no hypoglycemic complications. Risk factors for coronary artery disease include diabetes mellitus, dyslipidemia, hypertension and post-menopausal. Current diabetic treatment includes insulin  injections. She is compliant with treatment most of the time. She is following a diabetic diet. She participates in exercise intermittently. Eye exam is current.  Hypertension This is a chronic problem. The current episode started more than 1 year ago. The problem has been gradually improving since onset. The problem is controlled. Pertinent negatives include no blurred vision, chest pain, headaches or palpitations. Past treatments include angiotensin blockers. The current treatment provides moderate improvement. Compliance problems include exercise and diet.  Hypertensive end-organ damage includes kidney disease. Identifiable causes of hypertension include chronic renal disease.  Hyperlipidemia This is a chronic problem. The current episode started more than 1 year ago. Exacerbating diseases include chronic renal disease. Pertinent negatives include no chest pain. Current antihyperlipidemic treatment includes statins. The current treatment provides moderate improvement of lipids. Risk factors for coronary artery disease include diabetes mellitus, dyslipidemia, hypertension, post-menopausal and a sedentary lifestyle.     Past Medical History:  Diagnosis  Date   CKD (chronic kidney disease), stage II    COPD (chronic obstructive pulmonary disease) (HCC)    Coronary artery  disease 2005   cardiologist--- dr edison;    cardiac cath for acute angina 05-21-2004 multivessel disease;  cath after GI consult 12/ 2005  PCTA  w/ DES to prox RCA ;  cath 04-20-2006 patent stent with nonobstruction CAD;  NUC 12/ 2013 normal no ischemia;  last ETT 07-30-2022 no ischemia   Diverticulosis of colon    Dyslipidemia    Full dentures    History of anemia    History of hepatitis C virus infection    per pt was dx many yrs ago and treated in 2017  (noted in epic HCV RNA quan not detectable 10-25-2019)   Hypertension    OSA (obstructive sleep apnea)    study in epic 03-06-2019  moderate osa , (08-14-2022  pt stated she tried  cpap  but just decided to stop using it)   Osteoarthritis    PAD (peripheral artery disease) (HCC)    Peripheral neuropathy    Prolapse of anterior vaginal wall    S/P primary angioplasty with coronary stent 05/2004   PTCA and DES x1 to pRCA   Type 2 diabetes mellitus treated with insulin  (HCC)    followed by pcp   (08-14-2022  per pt checks blood sugar multiple times dialy w/ dexcom 7,  fasting average 101)   Uterovaginal prolapse, incomplete    Wears glasses      Family History  Problem Relation Age of Onset   CAD Father 68   Heart attack Father    CAD Mother 68   Heart attack Mother    CAD Brother    Cancer Brother        prostate   CAD Sister    Cancer Sister        breast   CAD Brother      Current Outpatient Medications:    amLODipine  (NORVASC ) 10 MG tablet, TAKE 1 TABLET(10 MG) BY MOUTH DAILY, Disp: 90 tablet, Rfl: 1   Ascorbic Acid  (VITAMIN C ) 500 MG CHEW, Chew by mouth daily., Disp: , Rfl:    aspirin  EC 81 MG tablet, Take 81 mg by mouth daily. Swallow whole., Disp: , Rfl:    Coenzyme Q10 (COQ-10) 100 MG CAPS, Take 1 tablet by mouth daily. , Disp: , Rfl:    Continuous Glucose Sensor (FREESTYLE LIBRE 3 PLUS SENSOR) MISC, Change sensor every 15 days. USE TO CHECK BLOOD SUGARS DAILY., Disp: 2 each, Rfl: 2   Dulaglutide  (TRULICITY ) 1.5  MG/0.5ML SOPN, Inject 1.5 mg into the skin once a week. Tuesday, Disp: 6 mL, Rfl: 3   FARXIGA  10 MG TABS tablet, Take 1 tablet (10 mg total) by mouth daily., Disp: 90 tablet, Rfl: 2   Glucagon  (GVOKE HYPOPEN  2-PACK) 0.5 MG/0.1ML SOAJ, Use for blood sugars less than 60., Disp: 0.2 mL, Rfl: 3   glucosamine-chondroitin 500-400 MG tablet, Take 1 tablet by mouth daily., Disp: , Rfl:    glucose blood (ONETOUCH VERIO) test strip, CHECK BLOOD SUGAR THREE TIMES DAILY dx: e11.65, Disp: 150 each, Rfl: 11   insulin  lispro (HUMALOG  KWIKPEN) 100 UNIT/ML KwikPen, Inject subcutaneously 2 units if sugars are 150 - 199, 4 units 200 -249, 6 units 250- 299, 8 units 300 - 349, 10 units >350, Disp: 15 mL, Rfl: 2   Insulin  Pen Needle (BD PEN NEEDLE NANO 2ND GEN) 32G X 4 MM MISC, USE TO INJECT  INSULIN  DAILY, Disp: 100 each, Rfl: 2   MAGNESIUM OXIDE PO, Take 400 mg by mouth at bedtime., Disp: , Rfl:    metoprolol  tartrate (LOPRESSOR ) 50 MG tablet, TAKE 1 TABLET BY MOUTH TWICE DAILY, Disp: 180 tablet, Rfl: 2   Misc Natural Products (NEURIVA) CAPS, Take 1 capsule by mouth daily., Disp: , Rfl:    mometasone  (NASONEX ) 50 MCG/ACT nasal spray, Place 2 sprays into the nose daily., Disp: 1 each, Rfl: 2   nystatin  powder, Apply topically 3 (three) times daily. As needed, Disp: 60 g, Rfl: 0   omeprazole  (PRILOSEC) 20 MG capsule, TAKE 1 CAPSULE BY MOUTH DAILY, Disp: 90 capsule, Rfl: 1   OneTouch Delica Lancets 33G MISC, USE AS DIRECTED TO CHECK BLOOD SUGAR THREE TIMES DAILY, Disp: 100 each, Rfl: 1   pregabalin  (LYRICA ) 50 MG capsule, Take 1 capsule (50 mg total) by mouth at bedtime., Disp: 90 capsule, Rfl: 2   rosuvastatin  (CRESTOR ) 40 MG tablet, Take 1 tablet (40 mg total) by mouth daily., Disp: 90 tablet, Rfl: 3   traZODone (DESYREL) 50 MG tablet, TAKE 1 TABLET BY MOUTH EVERY NIGHT AT BEDTIME, Disp: 90 tablet, Rfl: 1   Vitamin A 3 MG (10000 UT) TABS, Take 1 tablet by mouth daily. 2,400 mcg, Disp: , Rfl:    Vitamin D ,  Ergocalciferol , (DRISDOL ) 1.25 MG (50000 UNIT) CAPS capsule, Take 1 capsule (50,000 Units total) by mouth every 7 (seven) days., Disp: 12 capsule, Rfl: 1   vitamin E  600 UNIT capsule, Take 400 Units by mouth at bedtime., Disp: , Rfl:    Allergies  Allergen Reactions   Fish Allergy Hives    Cod   Latex Rash      The patient states she uses none for birth control. No LMP recorded. Patient is postmenopausal.. Negative for Dysmenorrhea. Negative for: breast discharge, breast lump(s), breast pain and breast self exam. Associated symptoms include abnormal vaginal bleeding. Pertinent negatives include abnormal bleeding (hematology), anxiety, decreased libido, depression, difficulty falling sleep, dyspareunia, history of infertility, nocturia, sexual dysfunction, sleep disturbances, urinary incontinence, urinary urgency, vaginal discharge and vaginal itching. Diet regular.The patient states her exercise level is  intermittent.  . The patient's tobacco use is:  Social History   Tobacco Use  Smoking Status Former   Current packs/day: 0.00   Average packs/day: 0.5 packs/day for 30.0 years (15.0 ttl pk-yrs)   Types: Cigarettes   Start date: 12/21/1983   Quit date: 12/20/2013   Years since quitting: 10.2  Smokeless Tobacco Never  . She has been exposed to passive smoke. The patient's alcohol use is:  Social History   Substance and Sexual Activity  Alcohol Use Not Currently   Comment: rarely   Review of Systems  Constitutional: Negative.   HENT: Negative.    Eyes: Negative.  Negative for blurred vision.  Respiratory: Negative.    Cardiovascular: Negative.  Negative for chest pain and palpitations.  Gastrointestinal: Negative.   Endocrine: Negative.  Negative for polydipsia, polyphagia and polyuria.  Genitourinary: Negative.   Musculoskeletal: Negative.   Skin: Negative.   Allergic/Immunologic: Negative.   Neurological: Negative.  Negative for dizziness and headaches.  Hematological:  Negative.   Psychiatric/Behavioral: Negative.       Today's Vitals   02/29/24 0830  BP: 110/70  Pulse: 72  Temp: 98.4 F (36.9 C)  SpO2: 98%  Weight: 175 lb (79.4 kg)  Height: 5' 4 (1.626 m)   Body mass index is 30.04 kg/m.  Wt Readings from Last 3 Encounters:  02/29/24 175 lb (79.4 kg)  01/05/24 173 lb (78.5 kg)  12/30/23 173 lb (78.5 kg)     Objective:  Physical Exam Vitals and nursing note reviewed.  Constitutional:      General: She is not in acute distress.    Appearance: Normal appearance. She is well-developed. She is obese.  HENT:     Head: Normocephalic and atraumatic.     Right Ear: Hearing, tympanic membrane, ear canal and external ear normal. There is no impacted cerumen.     Left Ear: Hearing, tympanic membrane, ear canal and external ear normal. There is no impacted cerumen.     Nose:     Comments: Deferred - masked    Mouth/Throat:     Comments: Deferred - masked Eyes:     General: Lids are normal.     Extraocular Movements: Extraocular movements intact.     Conjunctiva/sclera: Conjunctivae normal.     Pupils: Pupils are equal, round, and reactive to light.     Funduscopic exam:    Right eye: No papilledema.        Left eye: No papilledema.  Neck:     Thyroid : No thyroid  mass.     Vascular: No carotid bruit.  Cardiovascular:     Rate and Rhythm: Normal rate and regular rhythm.     Pulses:          Dorsalis pedis pulses are 1+ on the right side and 1+ on the left side.     Heart sounds: Normal heart sounds. No murmur heard. Pulmonary:     Effort: Pulmonary effort is normal.     Breath sounds: Normal breath sounds.  Chest:     Chest wall: No mass.  Breasts:    Tanner Score is 5.     Right: Normal. No mass or tenderness.     Left: Normal. No mass or tenderness.  Abdominal:     General: Abdomen is flat. Bowel sounds are normal. There is no distension.     Palpations: Abdomen is soft.     Tenderness: There is no abdominal tenderness.   Musculoskeletal:        General: No swelling.     Cervical back: Full passive range of motion without pain, normal range of motion and neck supple.     Right lower leg: No edema.     Left lower leg: No edema.     Comments: Soft tissue mass left uppr back extending into left axilla Soft, mobile, no overlying erythema Roughly 6x6  Feet:     Right foot:     Protective Sensation: 5 sites tested.  5 sites sensed.     Skin integrity: Dry skin present.     Toenail Condition: Right toenails are normal.     Left foot:     Protective Sensation: 5 sites tested.  5 sites sensed.     Skin integrity: Dry skin present.     Toenail Condition: Left toenails are normal.  Lymphadenopathy:     Upper Body:     Right upper body: No supraclavicular, axillary or pectoral adenopathy.     Left upper body: No supraclavicular, axillary or pectoral adenopathy.  Skin:    General: Skin is warm and dry.     Capillary Refill: Capillary refill takes less than 2 seconds.  Neurological:     General: No focal deficit present.     Mental Status: She is alert and oriented to person, place, and time.  Cranial Nerves: No cranial nerve deficit.     Sensory: No sensory deficit.  Psychiatric:        Mood and Affect: Mood normal.        Behavior: Behavior normal.        Thought Content: Thought content normal.        Judgment: Judgment normal.         Assessment And Plan:     Encounter for annual health examination Assessment & Plan: A full exam was performed.  Importance of monthly self breast exams was discussed with the patient.  She is advised to get 30-45 minutes of regular exercise, no less than four to five days per week. Both weight-bearing and aerobic exercises are recommended.  She is advised to follow a healthy diet with at least six fruits/veggies per day, decrease intake of red meat and other saturated fats and to increase fish intake to twice weekly.  Meats/fish should not be fried -- baked, boiled  or broiled is preferable. It is also important to cut back on your sugar intake.  Be sure to read labels - try to avoid anything with added sugar, high fructose corn syrup or other sweeteners.  If you must use a sweetener, you can try stevia or monkfruit.  It is also important to avoid artificially sweetened foods/beverages and diet drinks. Lastly, wear SPF 50 sunscreen on exposed skin and when in direct sunlight for an extended period of time.  Be sure to avoid fast food restaurants and aim for at least 60 ounces of water daily.       Type 2 diabetes mellitus with stage 2 chronic kidney disease, with long-term current use of insulin  [facility]) Assessment & Plan: Chronic, diabetic foot exam was performed.  Type 2 diabetes with chronic kidney disease. Blood sugar well-controlled. Issues with Farxiga  prescription refills noted. Uses Libre for glucose monitoring. - Ensure prescription for Farxiga  is refilled and correct any pharmacy errors. - Use Libre for glucose monitoring. - I DISCUSSED WITH THE PATIENT AT LENGTH REGARDING THE GOALS OF GLYCEMIC CONTROL AND POSSIBLE LONG-TERM COMPLICATIONS.  I  ALSO STRESSED THE IMPORTANCE OF COMPLIANCE WITH HOME GLUCOSE MONITORING, DIETARY RESTRICTIONS INCLUDING AVOIDANCE OF SUGARY DRINKS/PROCESSED FOODS,  ALONG WITH REGULAR EXERCISE.  I  ALSO STRESSED THE IMPORTANCE OF ANNUAL EYE EXAMS, SELF FOOT CARE AND COMPLIANCE WITH OFFICE VISITS.   Orders: -     EKG 12-Lead -     CMP14+EGFR; Future -     Lipid panel; Future -     Hemoglobin A1c; Future -     TSH; Future -     POCT URINALYSIS DIP (CLINITEK) -     Microalbumin / creatinine urine ratio  Hypertensive heart and renal disease with renal failure, stage 1 through stage 4 or unspecified chronic kidney disease, without heart failure Assessment & Plan: Chronic, fair control. Goal BP<120/80. EKG performed, NSR w/ occasional PACs, nonspecific T abnormality.   - She is encouraged to follow low sodium diet. She will c/w  amlodipine  10mg   and metoprolol  daily. We discussed the need for increased exercise. She is encouraged to avoid adding salt to her foods.   Orders: -     EKG 12-Lead -     CMP14+EGFR; Future -     Lipid panel; Future -     Hemoglobin A1c; Future -     TSH; Future -     POCT URINALYSIS DIP (CLINITEK) -     Microalbumin / creatinine urine ratio  Pure hypercholesterolemia Assessment & Plan: Chronic, rosuvastatin  increased to 40mg  daily by Cardiology. - Follow heart healthy lifestyle   Soft tissue mass Assessment & Plan: Left shoulder mass likely a lipoma. No concerns from recent mammogram. - Order ultrasound of left shoulder mass before referral to specialist.  Orders: -     US  CHEST SOFT TISSUE; Future  Incontinence of feces, unspecified fecal incontinence type Assessment & Plan: Bowel incontinence noted post bladder surgery. Reports lack of control. - Recommend pelvic physical therapy after clearance from surgeon.   Chronic insomnia Assessment & Plan: Chronic, she feels trazodone is ineffective.  - Increase dose of trazodone to 100mg  nightly - May take TWO 50mg  trazodone tablets until she runs out - Develop/maintain bedtime hygiene/sleep routine   Class 1 obesity due to excess calories with serious comorbidity and body mass index (BMI) of 30.0 to 30.9 in adult Assessment & Plan: Her BMI is acceptable for her demographic. She is encouraged to aim for at least 150 minutes of exercise per week.    Immunization due -     Flu vaccine HIGH DOSE PF(Fluzone Trivalent)   Return in 2 weeks (on 03/14/2024), or covid shot-NV, for 1 year HM, 4 month dm f/u.SABRA Patient was given opportunity to ask questions. Patient verbalized understanding of the plan and was able to repeat key elements of the plan. All questions were answered to their satisfaction.   I, Catheryn LOISE Slocumb, MD, have reviewed all documentation for this visit. The documentation on 02/29/24 for the exam, diagnosis,  procedures, and orders are all accurate and complete.

## 2024-02-29 NOTE — Assessment & Plan Note (Signed)
 Chronic, diabetic foot exam was performed.  Type 2 diabetes with chronic kidney disease. Blood sugar well-controlled. Issues with Farxiga  prescription refills noted. Uses Libre for glucose monitoring. - Ensure prescription for Farxiga  is refilled and correct any pharmacy errors. - Use Libre for glucose monitoring. - I DISCUSSED WITH THE PATIENT AT LENGTH REGARDING THE GOALS OF GLYCEMIC CONTROL AND POSSIBLE LONG-TERM COMPLICATIONS.  I  ALSO STRESSED THE IMPORTANCE OF COMPLIANCE WITH HOME GLUCOSE MONITORING, DIETARY RESTRICTIONS INCLUDING AVOIDANCE OF SUGARY DRINKS/PROCESSED FOODS,  ALONG WITH REGULAR EXERCISE.  I  ALSO STRESSED THE IMPORTANCE OF ANNUAL EYE EXAMS, SELF FOOT CARE AND COMPLIANCE WITH OFFICE VISITS.

## 2024-03-01 ENCOUNTER — Other Ambulatory Visit

## 2024-03-01 DIAGNOSIS — E1122 Type 2 diabetes mellitus with diabetic chronic kidney disease: Secondary | ICD-10-CM

## 2024-03-01 DIAGNOSIS — I1310 Hypertensive heart and chronic kidney disease without heart failure, with stage 1 through stage 4 chronic kidney disease, or unspecified chronic kidney disease: Secondary | ICD-10-CM | POA: Diagnosis not present

## 2024-03-01 DIAGNOSIS — N182 Chronic kidney disease, stage 2 (mild): Secondary | ICD-10-CM | POA: Diagnosis not present

## 2024-03-01 DIAGNOSIS — Z794 Long term (current) use of insulin: Secondary | ICD-10-CM | POA: Diagnosis not present

## 2024-03-01 LAB — MICROALBUMIN / CREATININE URINE RATIO
Creatinine, Urine: 44.3 mg/dL
Microalb/Creat Ratio: 36 mg/g{creat} — ABNORMAL HIGH (ref 0–29)
Microalbumin, Urine: 15.8 ug/mL

## 2024-03-02 LAB — CMP14+EGFR
ALT: 10 IU/L (ref 0–32)
AST: 17 IU/L (ref 0–40)
Albumin: 4.2 g/dL (ref 3.8–4.8)
Alkaline Phosphatase: 163 IU/L — ABNORMAL HIGH (ref 44–121)
BUN/Creatinine Ratio: 11 — ABNORMAL LOW (ref 12–28)
BUN: 9 mg/dL (ref 8–27)
Bilirubin Total: 0.3 mg/dL (ref 0.0–1.2)
CO2: 24 mmol/L (ref 20–29)
Calcium: 9.3 mg/dL (ref 8.7–10.3)
Chloride: 104 mmol/L (ref 96–106)
Creatinine, Ser: 0.79 mg/dL (ref 0.57–1.00)
Globulin, Total: 2.7 g/dL (ref 1.5–4.5)
Glucose: 156 mg/dL — ABNORMAL HIGH (ref 70–99)
Potassium: 4.1 mmol/L (ref 3.5–5.2)
Sodium: 142 mmol/L (ref 134–144)
Total Protein: 6.9 g/dL (ref 6.0–8.5)
eGFR: 79 mL/min/1.73 (ref >59)

## 2024-03-02 LAB — LIPID PANEL
Chol/HDL Ratio: 2.3 ratio (ref 0.0–4.4)
Cholesterol, Total: 144 mg/dL (ref 100–199)
HDL: 63 mg/dL (ref >39)
LDL Chol Calc (NIH): 69 mg/dL (ref 0–99)
Triglycerides: 60 mg/dL (ref 0–149)
VLDL Cholesterol Cal: 12 mg/dL (ref 5–40)

## 2024-03-02 LAB — HEMOGLOBIN A1C
Est. average glucose Bld gHb Est-mCnc: 169 mg/dL
Hgb A1c MFr Bld: 7.5 % — ABNORMAL HIGH (ref 4.8–5.6)

## 2024-03-02 LAB — TSH: TSH: 0.993 u[IU]/mL (ref 0.450–4.500)

## 2024-03-05 DIAGNOSIS — R159 Full incontinence of feces: Secondary | ICD-10-CM | POA: Insufficient documentation

## 2024-03-05 DIAGNOSIS — F5104 Psychophysiologic insomnia: Secondary | ICD-10-CM | POA: Insufficient documentation

## 2024-03-05 NOTE — Assessment & Plan Note (Signed)
Her BMI is acceptable for her demographic. She is encouraged to aim for at least 150 minutes of exercise per week.

## 2024-03-05 NOTE — Assessment & Plan Note (Signed)
 Bowel incontinence noted post bladder surgery. Reports lack of control. - Recommend pelvic physical therapy after clearance from surgeon.

## 2024-03-05 NOTE — Assessment & Plan Note (Signed)
 Left shoulder mass likely a lipoma. No concerns from recent mammogram. - Order ultrasound of left shoulder mass before referral to specialist.

## 2024-03-05 NOTE — Assessment & Plan Note (Signed)

## 2024-03-05 NOTE — Assessment & Plan Note (Signed)
 Chronic, rosuvastatin  increased to 40mg  daily by Cardiology. - Follow heart healthy lifestyle

## 2024-03-05 NOTE — Assessment & Plan Note (Signed)
 Chronic, fair control. Goal BP<120/80. EKG performed, NSR w/ occasional PACs, nonspecific T abnormality.   - She is encouraged to follow low sodium diet. She will c/w amlodipine  10mg   and metoprolol  daily. We discussed the need for increased exercise. She is encouraged to avoid adding salt to her foods.

## 2024-03-05 NOTE — Assessment & Plan Note (Signed)
 Chronic, she feels trazodone is ineffective.  - Increase dose of trazodone to 100mg  nightly - May take TWO 50mg  trazodone tablets until she runs out - Develop/maintain bedtime hygiene/sleep routine

## 2024-03-07 ENCOUNTER — Encounter: Payer: Self-pay | Admitting: Rheumatology

## 2024-03-07 ENCOUNTER — Ambulatory Visit

## 2024-03-07 ENCOUNTER — Ambulatory Visit: Attending: Rheumatology | Admitting: Rheumatology

## 2024-03-07 VITALS — BP 134/77 | HR 56 | Temp 97.9°F | Resp 16 | Ht 64.0 in | Wt 173.2 lb

## 2024-03-07 DIAGNOSIS — I25119 Atherosclerotic heart disease of native coronary artery with unspecified angina pectoris: Secondary | ICD-10-CM

## 2024-03-07 DIAGNOSIS — M79642 Pain in left hand: Secondary | ICD-10-CM

## 2024-03-07 DIAGNOSIS — I10 Essential (primary) hypertension: Secondary | ICD-10-CM

## 2024-03-07 DIAGNOSIS — M79641 Pain in right hand: Secondary | ICD-10-CM

## 2024-03-07 DIAGNOSIS — K219 Gastro-esophageal reflux disease without esophagitis: Secondary | ICD-10-CM

## 2024-03-07 DIAGNOSIS — Z6829 Body mass index (BMI) 29.0-29.9, adult: Secondary | ICD-10-CM

## 2024-03-07 DIAGNOSIS — E663 Overweight: Secondary | ICD-10-CM

## 2024-03-07 DIAGNOSIS — M25561 Pain in right knee: Secondary | ICD-10-CM

## 2024-03-07 DIAGNOSIS — E785 Hyperlipidemia, unspecified: Secondary | ICD-10-CM

## 2024-03-07 DIAGNOSIS — M170 Bilateral primary osteoarthritis of knee: Secondary | ICD-10-CM

## 2024-03-07 DIAGNOSIS — R768 Other specified abnormal immunological findings in serum: Secondary | ICD-10-CM

## 2024-03-07 DIAGNOSIS — B182 Chronic viral hepatitis C: Secondary | ICD-10-CM

## 2024-03-07 DIAGNOSIS — J449 Chronic obstructive pulmonary disease, unspecified: Secondary | ICD-10-CM

## 2024-03-07 DIAGNOSIS — M25562 Pain in left knee: Secondary | ICD-10-CM | POA: Diagnosis not present

## 2024-03-07 DIAGNOSIS — M19071 Primary osteoarthritis, right ankle and foot: Secondary | ICD-10-CM | POA: Diagnosis not present

## 2024-03-07 DIAGNOSIS — E1122 Type 2 diabetes mellitus with diabetic chronic kidney disease: Secondary | ICD-10-CM | POA: Diagnosis not present

## 2024-03-07 DIAGNOSIS — Z794 Long term (current) use of insulin: Secondary | ICD-10-CM

## 2024-03-07 DIAGNOSIS — Z87891 Personal history of nicotine dependence: Secondary | ICD-10-CM

## 2024-03-07 DIAGNOSIS — M19041 Primary osteoarthritis, right hand: Secondary | ICD-10-CM

## 2024-03-07 DIAGNOSIS — N182 Chronic kidney disease, stage 2 (mild): Secondary | ICD-10-CM | POA: Diagnosis not present

## 2024-03-07 DIAGNOSIS — Z820 Family history of epilepsy and other diseases of the nervous system: Secondary | ICD-10-CM

## 2024-03-07 DIAGNOSIS — M19072 Primary osteoarthritis, left ankle and foot: Secondary | ICD-10-CM

## 2024-03-07 DIAGNOSIS — I739 Peripheral vascular disease, unspecified: Secondary | ICD-10-CM

## 2024-03-07 DIAGNOSIS — G4733 Obstructive sleep apnea (adult) (pediatric): Secondary | ICD-10-CM

## 2024-03-07 DIAGNOSIS — F5101 Primary insomnia: Secondary | ICD-10-CM

## 2024-03-07 DIAGNOSIS — R748 Abnormal levels of other serum enzymes: Secondary | ICD-10-CM

## 2024-03-07 DIAGNOSIS — E559 Vitamin D deficiency, unspecified: Secondary | ICD-10-CM

## 2024-03-07 DIAGNOSIS — M19042 Primary osteoarthritis, left hand: Secondary | ICD-10-CM

## 2024-03-07 DIAGNOSIS — Z840 Family history of diseases of the skin and subcutaneous tissue: Secondary | ICD-10-CM

## 2024-03-07 DIAGNOSIS — R0683 Snoring: Secondary | ICD-10-CM

## 2024-03-07 NOTE — Patient Instructions (Signed)
 Osteoarthritis  Osteoarthritis is a type of arthritis. It refers to joint pain or joint disease. Osteoarthritis affects tissue that covers the ends of bones in joints (cartilage). Cartilage acts as a cushion between the bones and helps them move smoothly. Osteoarthritis occurs when cartilage in the joints gets worn down. Osteoarthritis is sometimes called wear and tear arthritis. Osteoarthritis is the most common form of arthritis. It often occurs in older people. It is a condition that gets worse over time. The joints most often affected by this condition are in the fingers, toes, hips, knees, and spine, including the neck and lower back. What are the causes? This condition is caused by the wearing down of cartilage that covers the ends of bones. What increases the risk? The following factors may make you more likely to develop this condition: Being age 14 or older. Obesity. Overuse of joints. Past injury of a joint. Past surgery on a joint. Family history of osteoarthritis. What are the signs or symptoms? The main symptoms of this condition are pain, swelling, and stiffness in the joint. Other symptoms may include: An enlarged joint. More pain and further damage caused by small pieces of bone or cartilage that break off and float inside of the joint. Small deposits of bone (osteophytes) that grow on the edges of the joint. A grating or scraping feeling inside the joint when you move it. Popping or creaking sounds when you move. Difficulty walking or exercising. An inability to grip items, twist your hand, or control the movements of your hands and fingers. How is this diagnosed? This condition may be diagnosed based on: Your medical history. A physical exam. Your symptoms. X-rays of the affected joints. Blood tests to rule out other types of arthritis. How is this treated? There is no cure for this condition, but treatment can help control pain and improve joint function. Treatment  may include a combination of therapies, such as: Pain relief techniques, such as: Applying heat and cold to the joint. Massage. A form of talk therapy called cognitive behavioral therapy (CBT). This therapy helps you set goals and follow up on the changes that you make. Medicines for pain and inflammation. The medicines can be taken by mouth or applied to the skin. They include: NSAIDs, such as ibuprofen . Prescription medicines. Strong anti-inflammatory medicines (corticosteroids). Certain nutritional supplements. A prescribed exercise program. You may work with a physical therapist. Assistive devices, such as a brace, wrap, splint, specialized glove, or cane. A weight control plan. Surgery, such as: An osteotomy. This is done to reposition the bones and relieve pain or to remove loose pieces of bone and cartilage. Joint replacement surgery. You may need this surgery if you have advanced osteoarthritis. Follow these instructions at home: Activity Rest your affected joints as told by your health care provider. Exercise as told by your provider. The provider may recommend specific types of exercise, such as: Strengthening exercises. These are done to strengthen the muscles that support joints affected by arthritis. Aerobic activities. These are exercises, such as brisk walking or water aerobics, that increase your heart rate. Range-of-motion activities. These help your joints move more easily. Balance and agility exercises. Managing pain, stiffness, and swelling     If told, apply heat to the affected area as often as told by your provider. Use the heat source that your provider recommends, such as a moist heat pack or a heating pad. If you have a removable assistive device, remove it as told by your provider. Place a  towel between your skin and the heat source. If your provider tells you to keep the assistive device on while you apply heat, place a towel between the assistive device and  the heat source. Leave the heat on for 20-30 minutes. If told, put ice on the affected area. If you have a removable assistive device, remove it as told by your provider. Put ice in a plastic bag. Place a towel between your skin and the bag. If your provider tells you to keep the assistive device on during icing, place a towel between the assistive device and the bag. Leave the ice on for 20 minutes, 2-3 times a day. If your skin turns bright red, remove the ice or heat right away to prevent skin damage. The risk of damage is higher if you cannot feel pain, heat, or cold. Move your fingers or toes often to reduce stiffness and swelling. Raise (elevate) the affected area above the level of your heart while you are sitting or lying down. General instructions Take over-the-counter and prescription medicines only as told by your provider. Maintain a healthy weight. Follow instructions from your provider for weight control. Do not use any products that contain nicotine or tobacco. These products include cigarettes, chewing tobacco, and vaping devices, such as e-cigarettes. If you need help quitting, ask your provider. Use assistive devices as told by your provider. Where to find more information [employer] Arthritis and Musculoskeletal and Skin Diseases: niams.http://www.myers.net/ [employer] on Aging: BaseRingTones.pl American College of Rheumatology: rheumatology.org Contact a health care provider if: You have redness, swelling, or a feeling of warmth in a joint that gets worse. You have a fever along with joint or muscle aches. You develop a rash. You have trouble doing your normal activities. You have pain that gets worse and is not relieved by pain medicine. This information is not intended to replace advice given to you by your health care provider. Make sure you discuss any questions you have with your health care provider. Document Revised: 02/06/2022 Document Reviewed:  02/06/2022 Else[employer] Patient Education  2024 [employer] Exercises for Chronic Knee Pain Chronic knee pain is pain that lasts longer than 3 months. For most people with chronic knee pain, exercise and weight loss is an important part of treatment. Your health care provider may want you to focus on: Making the muscles that support your knee stronger. This can take pressure off your knee and reduce pain. Preventing knee stiffness. How far you can move your knee, keeping it there or making it farther. Losing weight (if this applies) to take pressure off your knee, lower your risk for injury, and make it easier for you to exercise. Your provider will help you make an exercise program that fits your needs and physical abilities. Below are simple, low-impact exercises you can do at home. Ask your provider or physical therapist how often you should do your exercise program and how many times to repeat each exercise. General safety tips  Get your provider's approval before doing any exercises. Start slowly and stop any time you feel pain. Do not exercise if your knee pain is flaring up. Warm up first. Stretching a cold muscle can cause an injury. Do 5-10 minutes of easy movement or light stretching before beginning your exercises. Do 5-10 minutes of low-impact activity (like walking or cycling) before starting strengthening exercises. Contact your provider any time you have pain during or after exercising. Exercise can cause discomfort but should not be painful. It  is normal to be a little stiff or sore after exercising. Stretching and range-of-motion exercises Front thigh stretch  Stand up straight and support your body by holding on to a chair or resting one hand on a wall. With your legs straight and close together, bend one knee to lift your heel up toward your butt. Using one hand for support, grab your ankle with your free hand. Pull your foot up closer toward your butt to feel the stretch in  front of your thigh. Hold the stretch for 30 seconds. Repeat __________ times. Complete this exercise __________ times a day. Back thigh stretch  Sit on the floor with your back straight and your legs out straight in front of you. Place the palms of your hands on the floor and slide them toward your feet as you bend at the hip. Try to touch your nose to your knees and feel the stretch in the back of your thighs. Hold for 30 seconds. Repeat __________ times. Complete this exercise __________ times a day. Calf stretch  Stand facing a wall. Place the palms of your hands flat against the wall, arms extended, and lean slightly against the wall. Get into a lunge position with one leg bent at the knee and the other leg stretched out straight behind you. Keep both feet facing the wall and increase the bend in your knee while keeping the heel of the other leg flat on the ground. You should feel the stretch in your calf. Hold for 30 seconds. Repeat __________ times. Complete this exercise __________ times a day. Strengthening exercises Straight leg lift  Lie on your back with one knee bent and the other leg out straight. Slowly lift the straight leg without bending the knee. Lift until your foot is about 12 inches (30 cm) off the floor. Hold for 3-5 seconds and slowly lower your leg. Repeat __________ times. Complete this exercise __________ times a day. Single leg dip  Stand between two chairs and put both hands on the backs of the chairs for support. Extend one leg out straight with your body weight resting on the heel of the standing leg. Slowly bend your standing knee to dip your body to the level that is comfortable for you. Hold for 3-5 seconds. Repeat __________ times. Complete this exercise __________ times a day. Hamstring curls  Stand straight, knees close together, facing the back of a chair. Hold on to the back of a chair with both hands. Keep one leg straight. Bend the other  knee while bringing the heel up toward the butt until the knee is bent at a 90-degree angle (right angle). Hold for 3-5 seconds. Repeat __________ times. Complete this exercise __________ times a day. Wall squat  Stand straight with your back, hips, and head against a wall. Step forward one foot at a time with your back still against the wall. Your feet should be 2 feet (61 cm) from the wall at shoulder width. Keeping your back, hips, and head against the wall, slide down the wall to as close to a sitting position as you can get. Hold for 5-10 seconds, then slowly slide back up. Repeat __________ times. Complete this exercise __________ times a day. Step-ups  Stand in front of a sturdy platform or stool that is about 6 inches (15 cm) high. Slowly step up with your left / right foot, keeping your knee in line with your hip and foot. Do not let your knee bend so far that you cannot  see your toes. Hold on to a chair for balance, but do not use it for support. Slowly unlock your knee and lower yourself to the starting position. Repeat __________ times. Complete this exercise __________ times a day. Contact a health care provider if: Your exercises cause pain. Your pain is worse after you exercise. Your pain prevents you from doing your exercises. This information is not intended to replace advice given to you by your health care provider. Make sure you discuss any questions you have with your health care provider. Document Revised: 06/24/2022 Document Reviewed: 06/24/2022 Elsevier Patient Education  2024 [employer]Hand Exercises Hand exercises can be helpful for almost anyone. They can strengthen your hands and improve flexibility and movement. The exercises can also increase blood flow to the hands. These results can make your work and daily tasks easier for you. Hand exercises can be especially helpful for people who have joint pain from arthritis or nerve damage from using their hands over  and over. These exercises can also help people who injure a hand. Exercises Most of these hand exercises are gentle stretching and motion exercises. It is usually safe to do them often throughout the day. Warming up your hands before exercise may help reduce stiffness. You can do this with gentle massage or by placing your hands in warm water for 10-15 minutes. It is normal to feel some stretching, pulling, tightness, or mild discomfort when you begin new exercises. In time, this will improve. Remember to always be careful and stop right away if you feel sudden, very bad pain or your pain gets worse. You want to get better and be safe. Ask your health care provider which exercises are safe for you. Do exercises exactly as told by your provider and adjust them as told. Do not begin these exercises until told by your provider. Knuckle bend or claw fist  Stand or sit with your arm, hand, and all five fingers pointed straight up. Make sure to keep your wrist straight. Gently bend your fingers down toward your palm until the tips of your fingers are touching your palm. Keep your big knuckle straight and only bend the small knuckles in your fingers. Hold this position for 10 seconds. Straighten your fingers back to your starting position. Repeat this exercise 5-10 times with each hand. Full finger fist  Stand or sit with your arm, hand, and all five fingers pointed straight up. Make sure to keep your wrist straight. Gently bend your fingers into your palm until the tips of your fingers are touching the middle of your palm. Hold this position for 10 seconds. Extend your fingers back to your starting position, stretching every joint fully. Repeat this exercise 5-10 times with each hand. Straight fist  Stand or sit with your arm, hand, and all five fingers pointed straight up. Make sure to keep your wrist straight. Gently bend your fingers at the big knuckle, where your fingers meet your hand, and at  the middle knuckle. Keep the knuckle at the tips of your fingers straight and try to touch the bottom of your palm. Hold this position for 10 seconds. Extend your fingers back to your starting position, stretching every joint fully. Repeat this exercise 5-10 times with each hand. Tabletop  Stand or sit with your arm, hand, and all five fingers pointed straight up. Make sure to keep your wrist straight. Gently bend your fingers at the big knuckle, where your fingers meet your hand, as far down as you  can. Keep the small knuckles in your fingers straight. Think of forming a tabletop with your fingers. Hold this position for 10 seconds. Extend your fingers back to your starting position, stretching every joint fully. Repeat this exercise 5-10 times with each hand. Finger spread  Place your hand flat on a table with your palm facing down. Make sure your wrist stays straight. Spread your fingers and thumb apart from each other as far as you can until you feel a gentle stretch. Hold this position for 10 seconds. Bring your fingers and thumb tight together again. Hold this position for 10 seconds. Repeat this exercise 5-10 times with each hand. Making circles  Stand or sit with your arm, hand, and all five fingers pointed straight up. Make sure to keep your wrist straight. Make a circle by touching the tip of your thumb to the tip of your index finger. Hold for 10 seconds. Then open your hand wide. Repeat this motion with your thumb and each of your fingers. Repeat this exercise 5-10 times with each hand. Thumb motion  Sit with your forearm resting on a table and your wrist straight. Your thumb should be facing up toward the ceiling. Keep your fingers relaxed as you move your thumb. Lift your thumb up as high as you can toward the ceiling. Hold for 10 seconds. Bend your thumb across your palm as far as you can, reaching the tip of your thumb for the small finger (pinkie) side of your palm. Hold  for 10 seconds. Repeat this exercise 5-10 times with each hand. Grip strengthening  Hold a stress ball or other soft ball in the middle of your hand. Slowly increase the pressure, squeezing the ball as much as you can without causing pain. Think of bringing the tips of your fingers into the middle of your palm. All of your finger joints should bend when doing this exercise. Hold your squeeze for 10 seconds, then relax. Repeat this exercise 5-10 times with each hand. Contact a health care provider if: Your hand pain or discomfort gets much worse when you do an exercise. Your hand pain or discomfort does not improve within 2 hours after you exercise. If you have either of these problems, stop doing these exercises right away. Do not do them again unless your provider says that you can. Get help right away if: You develop sudden, severe hand pain or swelling. If this happens, stop doing these exercises right away. Do not do them again unless your provider says that you can. This information is not intended to replace advice given to you by your health care provider. Make sure you discuss any questions you have with your health care provider. Document Revised: 06/24/2022 Document Reviewed: 06/24/2022 Elsevier Patient Education  2024 [employer].

## 2024-03-08 ENCOUNTER — Ambulatory Visit
Admission: RE | Admit: 2024-03-08 | Discharge: 2024-03-08 | Disposition: A | Discharge status: Home / Self Care | Source: Ambulatory Visit | Attending: Internal Medicine | Admitting: Internal Medicine

## 2024-03-08 DIAGNOSIS — R222 Localized swelling, mass and lump, trunk: Secondary | ICD-10-CM | POA: Diagnosis not present

## 2024-03-08 DIAGNOSIS — M7989 Other specified soft tissue disorders: Secondary | ICD-10-CM

## 2024-03-10 ENCOUNTER — Ambulatory Visit: Payer: Self-pay | Admitting: Rheumatology

## 2024-03-10 LAB — LUPUS ANTICOAGULANT EVAL W/ REFLEX
PTT-LA Screen: 37 s (ref <=40)
dRVVT: 34 s (ref <=45)

## 2024-03-10 LAB — C3 AND C4
C3 Complement: 165 mg/dL (ref 83–193)
C4 Complement: 50 mg/dL (ref 15–57)

## 2024-03-10 LAB — RNP ANTIBODY: Ribonucleic Protein(ENA) Antibody, IgG: <1 AI

## 2024-03-10 LAB — ANTI-NUCLEAR AB-TITER (ANA TITER)
ANA TITER: 1:80 {titer} — ABNORMAL HIGH
ANA Titer 1: 1:320 {titer} — ABNORMAL HIGH

## 2024-03-10 LAB — BETA-2 GLYCOPROTEIN ANTIBODIES
Beta-2 Glyco 1 IgA: <2 U/mL (ref <20.0)
Beta-2 Glyco 1 IgM: <2 U/mL (ref <20.0)
Beta-2 Glyco I IgG: <2 U/mL (ref <20.0)

## 2024-03-10 LAB — CARDIOLIPIN ANTIBODIES, IGG, IGM, IGA
Anticardiolipin IgA: <2 [APL'U]/mL (ref <20.0)
Anticardiolipin IgG: <2 [GPL'U]/mL (ref <20.0)
Anticardiolipin IgM: <2 [MPL'U]/mL (ref <20.0)

## 2024-03-10 LAB — SJOGRENS SYNDROME-A EXTRACTABLE NUCLEAR ANTIBODY: SSA (Ro) (ENA) Antibody, IgG: >8 AI — AB

## 2024-03-10 LAB — PROTEIN ELECTROPHORESIS, SERUM, WITH REFLEX
Albumin ELP: 4.2 g/dL (ref 3.8–4.8)
Alpha 1: 0.3 g/dL (ref 0.2–0.3)
Alpha 2: 0.9 g/dL (ref 0.5–0.9)
Beta 2: 0.5 g/dL (ref 0.2–0.5)
Beta Globulin: 0.4 g/dL (ref 0.4–0.6)
Gamma Globulin: 1.3 g/dL (ref 0.8–1.7)
Total Protein: 7.7 g/dL (ref 6.1–8.1)

## 2024-03-10 LAB — SJOGRENS SYNDROME-B EXTRACTABLE NUCLEAR ANTIBODY: SSB (La) (ENA) Antibody, IgG: <1 AI

## 2024-03-10 LAB — ANA: Anti Nuclear Antibody (ANA): POSITIVE — AB

## 2024-03-10 LAB — ANTI-DNA ANTIBODY, DOUBLE-STRANDED: ds DNA Ab: <1 [IU]/mL

## 2024-03-10 LAB — ANTI-SMITH ANTIBODY: ENA SM Ab Ser-aCnc: <1 AI

## 2024-03-10 LAB — ANTI-SCLERODERMA ANTIBODY: Scleroderma (Scl-70) (ENA) Antibody, IgG: <1 AI

## 2024-03-10 LAB — GLUCOSE 6 PHOSPHATE DEHYDROGENASE: G-6PDH: 24.4 U/g{Hb} — ABNORMAL HIGH (ref 7.0–20.5)

## 2024-03-10 LAB — SEDIMENTATION RATE: Sed Rate: 6 mm/h (ref 0–30)

## 2024-03-10 NOTE — Progress Notes (Signed)
 Labs are positive for ANA and SSA antibody.  Rest of the labs are negative.  I will discuss results at the follow-up visit.

## 2024-03-14 ENCOUNTER — Other Ambulatory Visit: Payer: Self-pay | Admitting: Internal Medicine

## 2024-03-14 DIAGNOSIS — M7989 Other specified soft tissue disorders: Secondary | ICD-10-CM

## 2024-03-15 ENCOUNTER — Ambulatory Visit

## 2024-03-18 DIAGNOSIS — E1165 Type 2 diabetes mellitus with hyperglycemia: Secondary | ICD-10-CM | POA: Diagnosis not present

## 2024-03-22 NOTE — Progress Notes (Signed)
 Office Visit Note  Patient: [name] [name]             Date of Birth: 01/31/1951           MRN: [id number]             PCP: Jarold Medici, MD Referring: Jarold Medici, MD Visit Date: 04/05/2024 Occupation: Data Unavailable  Subjective:  Dry eyes and joint pain  History of Present Illness: [name] [name] is a 73 y.o. female with arthralgias and positive ANA.  She returns today after her last visit on March 07, 2024.  She continues to have pain and discomfort in her hands and her knee joints.  She states she has dry eyes.  She uses over-the-counter eyedrops.  She denies  symptoms of dry mouth.  She is followed by Dr. Octavia.  She states she has an appointment coming up for evaluation of cataract surgery.  She has not noticed much joint swelling.  She states she gets swelling in her legs after prolonged standing.  She denies any shortness of breath or lymphadenopathy.    Activities of Daily Living:  Patient reports morning stiffness for 10 minutes.   Patient Reports nocturnal pain.  Difficulty dressing/grooming: Denies Difficulty climbing stairs: Denies Difficulty getting out of chair: Denies Difficulty using hands for taps, buttons, cutlery, and/or writing: Reports  Review of Systems  Constitutional:  Negative for fatigue.  HENT:  Positive for mouth dryness. Negative for mouth sores.   Eyes:  Negative for dryness.  Respiratory:  Negative for shortness of breath.   Cardiovascular:  Negative for chest pain and palpitations.  Gastrointestinal:  Positive for constipation and diarrhea. Negative for blood in stool.  Endocrine: Negative for increased urination.  Genitourinary:  Negative for involuntary urination.  Musculoskeletal:  Positive for joint pain, joint pain, joint swelling, myalgias, muscle weakness, morning stiffness, muscle tenderness and myalgias. Negative for gait problem.  Skin:  Positive for color change. Negative for rash, hair loss and sensitivity to sunlight.   Allergic/Immunologic: Negative for susceptible to infections.  Neurological:  Positive for headaches. Negative for dizziness.  Hematological:  Negative for swollen glands.  Psychiatric/Behavioral:  Negative for depressed mood and sleep disturbance. The patient is not nervous/anxious.     PMFS History:  Patient Active Problem List   Diagnosis Date Noted   Chronic insomnia 03/05/2024   Incontinence of feces 03/05/2024   Encounter for annual health examination 02/29/2024   Mastodynia of left breast 01/03/2024   Soft tissue mass 01/03/2024   Positive ANA (antinuclear antibody) 01/03/2024   Vitamin D  deficiency disease 11/30/2023   Pain in both hands 08/31/2023   Gastroesophageal reflux disease without esophagitis 05/28/2023   Sore throat 03/03/2023   Postnasal drip 03/03/2023   Supraumbilical hernia without gangrene and without obstruction 01/08/2023   Overweight with body mass index (BMI) of 29 to 29.9 in adult 01/08/2023   Atherosclerosis of aorta 12/14/2022   Chronic renal disease, stage II 12/14/2022   Pure hypercholesterolemia 04/30/2022   Female genital prolapse 04/30/2022   Constipation 12/16/2021   Pelvic relaxation 10/17/2021   Parenchymal renal hypertension 07/04/2020   Educated about COVID-19 virus infection 03/25/2020   OSA and COPD overlap syndrome (HCC) 03/14/2019   Snoring 02/17/2019   Peripheral arterial disease 01/26/2019   Hepatitis C carrier (HCC) 01/26/2019   Hypertensive heart and renal disease 08/19/2018   Class 1 obesity due to excess calories with serious comorbidity and body mass index (BMI) of 30.0 to 30.9 in adult 08/19/2018  Paresthesia of left upper extremity 06/29/2018   Atherosclerosis of native coronary artery of native heart with angina pectoris 04/09/2018   Dyslipidemia 04/09/2018   Primary osteoarthritis of both feet 11/05/2017   Primary osteoarthritis of both hands 10/30/2017   Primary osteoarthritis of both knees 10/30/2017   Primary  insomnia 10/30/2017   Family history of lupus erythematosus 10/30/2017   Dyspnea on exertion 12/10/2016   Type 2 diabetes mellitus with stage 2 chronic kidney disease, with long-term current use of insulin  (HCC) 09/21/2014   Acute cystitis 09/21/2014   Hyperglycemia 09/19/2014   Bradycardia 09/19/2014   Hyponatremia 09/19/2014   Elevated alkaline phosphatase level 09/19/2014   Acute kidney injury    Essential hypertension, benign 07/05/2008   Coronary atherosclerosis 07/05/2008    Past Medical History:  Diagnosis Date   CKD (chronic kidney disease), stage II    COPD (chronic obstructive pulmonary disease) (HCC)    Coronary artery disease 2005   cardiologist--- dr edison;    cardiac cath for acute angina 05-21-2004 multivessel disease;  cath after GI consult 12/ 2005  PCTA  w/ DES to prox RCA ;  cath 04-20-2006 patent stent with nonobstruction CAD;  NUC 12/ 2013 normal no ischemia;  last ETT 07-30-2022 no ischemia   Diverticulosis of colon    Dyslipidemia    Full dentures    History of anemia    History of hepatitis C virus infection    per pt was dx many yrs ago and treated in 2017  (noted in epic HCV RNA quan not detectable 10-25-2019)   Hypertension    OSA (obstructive sleep apnea)    study in epic 03-06-2019  moderate osa , (08-14-2022  pt stated she tried  cpap  but just decided to stop using it)   Osteoarthritis    PAD (peripheral artery disease)    Peripheral neuropathy    Prolapse of anterior vaginal wall    S/P primary angioplasty with coronary stent 05/2004   PTCA and DES x1 to pRCA   Type 2 diabetes mellitus treated with insulin  (HCC)    followed by pcp   (08-14-2022  per pt checks blood sugar multiple times dialy w/ dexcom 7,  fasting average 101)   Uterovaginal prolapse, incomplete    Wears glasses     Family History  Problem Relation Age of Onset   CAD Mother 60   Heart attack Mother    CAD Father 56   Heart attack Father    CAD Sister    Cancer Sister     Heart disease Sister    CAD Brother    Cancer Brother        prostate   Heart attack Brother    CAD Brother    Drug abuse Granddaughter    Other Son        MVA   Lupus Daughter    Past Surgical History:  Procedure Laterality Date   ANTERIOR AND POSTERIOR REPAIR WITH SACROSPINOUS FIXATION N/A 08/18/2022   Procedure: ANTERIOR AND POSTERIOR REPAIR, PERINEORRHAPHY WITH SACROSPINOUS FIXATION;  Surgeon: Marilynne Rosaline SAILOR, MD;  Location: [facility] ;  Service: Gynecology;  Laterality: N/A;  Total time requested is 1.5hrs   APPENDECTOMY  2003   BREAST EXCISIONAL BIOPSY Right    age 35s;   benign   CARDIAC CATHETERIZATION  05/21/2004   @[facility]  by dr s. debera;   acute angina;   multivessel disease;   pRCA 80%,  dRCA 50-60%,  dLM 40%,  ostialLAD  50-60%,  pCfx 30%,  LVEF 60%   CARDIAC CATHETERIZATION  04/20/2006   @[facility]  by dr w. wyn;  angina;  patent RCA stent,  minimal nonobstructive coronary disease   CARPAL TUNNEL RELEASE Right 2005   CATARACT EXTRACTION W/ INTRAOCULAR LENS IMPLANT Bilateral 2022   COLONOSCOPY WITH PROPOFOL   10/12/2019   dr saintclair   CORONARY ANGIOPLASTY WITH STENT PLACEMENT  05/24/2004   @[facility]  by dr lelon. wyn;  after GI consult for bleed;   PTCA and DES x1  pRCA   CYSTOSCOPY N/A 08/18/2022   Procedure: CYSTOSCOPY;  Surgeon: Marilynne Rosaline SAILOR, MD;  Location: [facility];  Service: Gynecology;  Laterality: N/A;   TONSILLECTOMY     yrs ago   Social History   Tobacco Use   Smoking status: Former    Current packs/day: 0.00    Average packs/day: 0.5 packs/day for 30.0 years (15.0 ttl pk-yrs)    Types: Cigarettes    Start date: 12/21/1983    Quit date: 12/20/2013    Years since quitting: 10.2    Passive exposure: Never   Smokeless tobacco: Never  Vaping Use   Vaping status: Never Used  Substance Use Topics   Alcohol use: Not Currently   Drug use: Not Currently    Comment: quit 2013   Social History   Social History Narrative    Lives alone.   One son died in a car accident.     Coffee in am ,1 cup     Immunization History  Administered Date(s) Administered   Fluad Quad(high Dose 65+) 03/04/2019, 03/10/2022, 03/09/2023   INFLUENZA, HIGH DOSE SEASONAL PF 03/21/2017, 03/11/2018, 02/29/2024   Influenza-Unspecified 03/07/2020, 03/13/2021   PFIZER(Purple Top)SARS-COV-2 Vaccination 07/30/2019, 08/20/2019, 04/16/2020, 11/23/2020, 05/01/2021   PNEUMOCOCCAL CONJUGATE-20 04/27/2021   Pneumococcal Conjugate-13 10/23/2016   Tdap 12/15/2014   Unspecified SARS-COV-2 Vaccination 03/10/2022, 03/09/2023   Zoster Recombinant(Shingrix) 01/25/2019, 03/30/2019     Objective: Vital Signs: BP 117/73   Pulse (!) 51   Temp 97.7 F (36.5 C)   Resp 14   Ht 5' 4 (1.626 m)   Wt 175 lb 3.2 oz (79.5 kg)   BMI 30.07 kg/m    Physical Exam Vitals and nursing note reviewed.  Constitutional:      Appearance: She is well-developed.  HENT:     Head: Normocephalic and atraumatic.  Eyes:     Conjunctiva/sclera: Conjunctivae normal.  Cardiovascular:     Rate and Rhythm: Normal rate and regular rhythm.     Heart sounds: Normal heart sounds.  Pulmonary:     Effort: Pulmonary effort is normal.     Breath sounds: Normal breath sounds.  Abdominal:     General: Bowel sounds are normal.     Palpations: Abdomen is soft.  Musculoskeletal:     Cervical back: Normal range of motion.  Lymphadenopathy:     Cervical: No cervical adenopathy.  Skin:    General: Skin is warm and dry.     Capillary Refill: Capillary refill takes less than 2 seconds.  Neurological:     Mental Status: She is alert and oriented to person, place, and time.  Psychiatric:        Behavior: Behavior normal.      Musculoskeletal Exam: She had good range of motion of the cervical, thoracic and lumbar spine without any tenderness.  Shoulders, elbows, wrist joints, MCPs PIPs and DIPs.  Good range of motion with bilateral CMC, PIP and DIP thickening.  Hip joints are  in  good range of motion.  She has some discomfort range of motion of her right knee joint.  No warmth swelling or effusion was noted.  There was no tenderness over her ankles or MTPs.  CDAI Exam: CDAI Score: -- Patient Global: --; Provider Global: -- Swollen: --; Tender: -- Joint Exam 04/05/2024   No joint exam has been documented for this visit   There is currently no information documented on the homunculus. Go to the Rheumatology activity and complete the homunculus joint exam.  Investigation: No additional findings.  Imaging: US  CHEST SOFT TISSUE Result Date: 03/11/2024 CLINICAL DATA:  Firm round mass of the LEFT upper back extending under the axilla. EXAM: ULTRASOUND OF CHEST SOFT TISSUES TECHNIQUE: Ultrasound examination of the chest wall soft tissues was performed in the area of clinical concern. COMPARISON:  None available FINDINGS: Targeted sonographic evaluation of the LEFT upper back demonstrates a circumscribed isoechoic mass measuring 9.8 x 5.0 x 8.3 cm. IMPRESSION: 9.8 x 5.0 x 8.3 cm mass of the LEFT upper back is most likely a lipoma, however it is difficult to fully evaluate by ultrasound due to its large size. Further evaluation with contrast enhanced MRI should be performed for definitive characterization. Electronically Signed   By: Aliene Lloyd M.D.   On: 03/11/2024 14:35   XR Hand 2 View Right Result Date: 03/07/2024 CMC, PIP and DIP narrowing was noted.  Mild 2nd and 3rd MCP narrowing was noted.  Calcification was noted around third MCP.  Intercarpal or radiocarpal joint space narrowing was noted.  No erosive changes were noted. Impression: These findings are suggestive of osteoarthritis and possible crystal induced arthropathy.  XR Hand 2 View Left Result Date: 03/07/2024 Severe CMC narrowing and subluxation was noted.  Spurring was noted.  PIP and DIP narrowing was noted.  No MCP, intercarpal or radiocarpal joint space narrowing was noted.  No erosive changes were  noted. Impression: These findings suggestive of osteoarthritis of the hand.  XR KNEE 3 VIEW LEFT Result Date: 03/07/2024 Severe medial compartment narrowing with medial, lateral and intercondylar osteophytes was noted.  Severe patellofemoral narrowing was noted.  Vascular calcification was noted. Impression: These findings suggestive of severe osteoarthritis and severe chondromalacia patella.  XR KNEE 3 VIEW RIGHT Result Date: 03/07/2024 Severe medial compartment narrowing with medial and intercondylar osteophytes was noted.  Severe patellofemoral narrowing was noted.  Vascular calcification was noted. Impression: These findings suggestive of severe osteoarthritis and severe chondromalacia patella.   Recent Labs: Lab Results  Component Value Date   WBC 4.5 11/30/2023   HGB 12.0 11/30/2023   PLT 248 11/30/2023   NA 142 03/01/2024   K 4.1 03/01/2024   CL 104 03/01/2024   CO2 24 03/01/2024   GLUCOSE 156 (H) 03/01/2024   BUN 9 03/01/2024   CREATININE 0.79 03/01/2024   BILITOT 0.3 03/01/2024   ALKPHOS 163 (H) 03/01/2024   AST 17 03/01/2024   ALT 10 03/01/2024   PROT 7.7 03/07/2024   ALBUMIN 4.2 03/01/2024   CALCIUM  9.3 03/01/2024   GFRAA 87 07/04/2020   February 26, 2024 SPEP normal, G6PD normal, ANA 1: 320 NS, 1: 80 cytoplasmic, SSA> 8.0, (SSB, dsDNA, Smith, RNP, SCL 70 negative), C3-C4 normal, anticardiolipin negative, beta-2  GP 1 negative, lupus anticoagulant negative.   August 31, 2023 lipid panel normal, hemoglobin A1c 7.6, ANA 1: 640 NS, RF negative, anti-CCP negative, sed rate 20, uric acid 2.8 November 30, 2023 alkaline phosphatase 153 (intestinal fraction 30), vitamin D  86.3  Speciality Comments: No  specialty comments available.  Procedures:  No procedures performed Allergies: Fish allergy and Latex   Assessment / Plan:     Visit Diagnoses: Sjogren's syndrome with keratoconjunctivitis sicca - Positive ANA, positive SSA, no history of sicca symptoms.  History of intermittent  joint swelling.  No synovitis was noted on the examination.  Had a detailed discussion with the patient regarding the Sjogren's disease.  Her symptoms are mostly dry eyes.  She denies any history of dry mouth.  She uses dentures.  She states her eye symptoms are well-controlled with over-the-counter products.  Detailed counsel regarding Sjogren's disease was provided and handout was placed in the AVS.  She denies any history of shortness of breath.  Association of ILD, lymphoma and arrhythmias with SSA antibodies were discussed.  I advised her to contact me if she develops any new symptoms.  No synovitis was noted on the examination.  I discussed possible use of hydroxychloroquine if she develops inflammatory arthritis.  Primary osteoarthritis of both hands -she had bilateral CMC, PIP and DIP thickening.  X-rays obtained at the last visit showed severe left CMC narrowing, right 2nd and 3rd MCP narrowing and calcification in  the third MCP joint.  It could be possible crystal induced arthropathy.  Joint protection muscle strengthening was discussed.  A handout on hand exercises was given.  Primary osteoarthritis of both knees -she complains of discomfort in her right knee joint.  She was under care of [facility] in the past and had injections.  She states the knee pain is not as bad currently.  X-rays obtained at the last visit showed severe osteoarthritis and severe chondromalacia patella of both knees.  A handout on lower extremity muscle strength exercise was given.  Primary osteoarthritis of both feet-she has intermittent discomfort in her feet after prolonged standing.  She had bunions and PIP and DIP thickening.  Elevated alkaline phosphatase level-her alkaline phosphatase has been elevated at least since 2021.  I will schedule a total body bone scan to evaluate this further.  SPEP was normal.  Calcium  was normal.  Family history of lupus erythematosus-daughter  Other medical problems are  listed as follows:  Dyslipidemia  Type 2 diabetes mellitus with stage 2 chronic kidney disease, with long-term current use of insulin  (HCC)  Essential hypertension, benign  Chronic renal disease, stage II  Peripheral arterial disease  Atherosclerosis of native coronary artery of native heart with angina pectoris  Gastroesophageal reflux disease without esophagitis  Primary insomnia  Hepatitis C carrier (HCC)  Vitamin D  deficiency disease  OSA and COPD overlap syndrome (HCC)  Snoring  Overweight with body mass index (BMI) of 29 to 29.9 in adult  Former smoker  Family history of multiple sclerosis-niece  Orders: Orders Placed This Encounter  Procedures   NM Bone Scan Whole Body   No orders of the defined types were placed in this encounter.    Follow-Up Instructions: Return in about 6 months (around 10/04/2024) for Sjogren's, Osteoarthritis.   Maya Nash, MD  Note - This record has been created using Animal nutritionist.  Chart creation errors have been sought, but may not always  have been located. Such creation errors do not reflect on  the standard of medical care.

## 2024-03-29 DIAGNOSIS — M2042 Other hammer toe(s) (acquired), left foot: Secondary | ICD-10-CM | POA: Diagnosis not present

## 2024-03-29 DIAGNOSIS — E1122 Type 2 diabetes mellitus with diabetic chronic kidney disease: Secondary | ICD-10-CM | POA: Diagnosis not present

## 2024-03-29 DIAGNOSIS — M2041 Other hammer toe(s) (acquired), right foot: Secondary | ICD-10-CM | POA: Diagnosis not present

## 2024-04-04 ENCOUNTER — Other Ambulatory Visit: Payer: Self-pay | Admitting: Nurse Practitioner

## 2024-04-05 ENCOUNTER — Ambulatory Visit: Attending: Rheumatology | Admitting: Rheumatology

## 2024-04-05 ENCOUNTER — Encounter: Payer: Self-pay | Admitting: Rheumatology

## 2024-04-05 VITALS — BP 117/73 | HR 51 | Temp 97.7°F | Resp 14 | Ht 64.0 in | Wt 175.2 lb

## 2024-04-05 DIAGNOSIS — G4733 Obstructive sleep apnea (adult) (pediatric): Secondary | ICD-10-CM

## 2024-04-05 DIAGNOSIS — I10 Essential (primary) hypertension: Secondary | ICD-10-CM

## 2024-04-05 DIAGNOSIS — M3501 Sicca syndrome with keratoconjunctivitis: Secondary | ICD-10-CM | POA: Diagnosis not present

## 2024-04-05 DIAGNOSIS — K219 Gastro-esophageal reflux disease without esophagitis: Secondary | ICD-10-CM

## 2024-04-05 DIAGNOSIS — I739 Peripheral vascular disease, unspecified: Secondary | ICD-10-CM | POA: Diagnosis not present

## 2024-04-05 DIAGNOSIS — R0683 Snoring: Secondary | ICD-10-CM

## 2024-04-05 DIAGNOSIS — M19071 Primary osteoarthritis, right ankle and foot: Secondary | ICD-10-CM | POA: Diagnosis not present

## 2024-04-05 DIAGNOSIS — E1122 Type 2 diabetes mellitus with diabetic chronic kidney disease: Secondary | ICD-10-CM

## 2024-04-05 DIAGNOSIS — E785 Hyperlipidemia, unspecified: Secondary | ICD-10-CM

## 2024-04-05 DIAGNOSIS — Z87891 Personal history of nicotine dependence: Secondary | ICD-10-CM

## 2024-04-05 DIAGNOSIS — M19042 Primary osteoarthritis, left hand: Secondary | ICD-10-CM

## 2024-04-05 DIAGNOSIS — M19041 Primary osteoarthritis, right hand: Secondary | ICD-10-CM

## 2024-04-05 DIAGNOSIS — N182 Chronic kidney disease, stage 2 (mild): Secondary | ICD-10-CM | POA: Diagnosis not present

## 2024-04-05 DIAGNOSIS — R748 Abnormal levels of other serum enzymes: Secondary | ICD-10-CM | POA: Diagnosis not present

## 2024-04-05 DIAGNOSIS — Z820 Family history of epilepsy and other diseases of the nervous system: Secondary | ICD-10-CM

## 2024-04-05 DIAGNOSIS — F5101 Primary insomnia: Secondary | ICD-10-CM

## 2024-04-05 DIAGNOSIS — M170 Bilateral primary osteoarthritis of knee: Secondary | ICD-10-CM | POA: Diagnosis not present

## 2024-04-05 DIAGNOSIS — Z840 Family history of diseases of the skin and subcutaneous tissue: Secondary | ICD-10-CM | POA: Diagnosis not present

## 2024-04-05 DIAGNOSIS — Z6829 Body mass index (BMI) 29.0-29.9, adult: Secondary | ICD-10-CM

## 2024-04-05 DIAGNOSIS — M19072 Primary osteoarthritis, left ankle and foot: Secondary | ICD-10-CM

## 2024-04-05 DIAGNOSIS — B182 Chronic viral hepatitis C: Secondary | ICD-10-CM

## 2024-04-05 DIAGNOSIS — I25119 Atherosclerotic heart disease of native coronary artery with unspecified angina pectoris: Secondary | ICD-10-CM

## 2024-04-05 DIAGNOSIS — J449 Chronic obstructive pulmonary disease, unspecified: Secondary | ICD-10-CM

## 2024-04-05 DIAGNOSIS — R7689 Other specified abnormal immunological findings in serum: Secondary | ICD-10-CM

## 2024-04-05 DIAGNOSIS — E663 Overweight: Secondary | ICD-10-CM

## 2024-04-05 DIAGNOSIS — E559 Vitamin D deficiency, unspecified: Secondary | ICD-10-CM

## 2024-04-05 DIAGNOSIS — Z794 Long term (current) use of insulin: Secondary | ICD-10-CM

## 2024-04-05 NOTE — Patient Instructions (Addendum)
 Artificial Tears Eye Solution What is this medication? ARTIFICIAL TEARS (ahr tuh FISH uhl teerz) treats discomfort caused by dry eyes. It works by moisturizing and protecting the outer surface of your eyes. This medicine may be used for other purposes; ask your health care provider or pharmacist if you have questions. COMMON BRAND NAME(S): Advanced Eye Relief, Akwa Tears, Akwa Tears Renewed, Alcon Tears, Artificial Tears, Bion Tears, Blink Gel Tears, Blink Tears, Clear eyes, Clear eyes Outdoor Dry Eye Protection, FreshKote, GenTeal Mild, GenTeal Moderate, GenTeal PF, GenTeal Tears, Gonak, Goniosoft, Hypo Tears, ImproVue, Isopto Tears, LiquiTears, Lubricating Plus, Moisture Eyes, Moisture Eyes Preservative Free, Murine, Natural Balance Tears, Nature's Tears, Ocucoat Viscoadherent, Opti-Free, Puralube Tears, Refresh, Refresh Celluvisc, Refresh Contacts Comfort, Refresh Endura, Refresh Liquigel, Refresh Optive, Refresh Optive Sensitive, Refresh Plus, Refresh RELIEVA, Refresh RELIEVA PF, Refresh Tears, Refresh TEARS PF, Reliable-1 Luibricating Tears, Retaine CMC, Soothe Lubricant Dry Eye Therapy, Stye, Systane Balance, Systane Complete, Teargen, Tears Naturale Forte, Tears Naturale II, Tears Renewed, TheraTears, Ventiva, Visine Advanced, Visine Dry Eye Relief, Visine Pure Tears, Visine Tears, Visine Tired Eye Relief, Vista Gonio, Vista Meibo, Viva What should I tell my care team before I take this medication? They need to know if you have any of these conditions: Change in vision Eye infection or trauma Wear contact lenses An unusual or allergic reaction to artificial tears, other medications, foods, dyes, or preservatives Pregnant or trying to get pregnant Breast-feeding How should I use this medication? This medication is only for use in the eye. Do not take by mouth. Follow the directions on the label. Wash hands before and after use. Tilt the head back slightly and pull down the lower eyelid with your  index finger to form a pouch. Try not to touch the tip of the dropper to your eye, fingertips, or any other surface. Squeeze the prescribed number of drops (usually one or two drops) into the pouch. Close the eye gently for a few moments to allow the drops to be in contact with the eye. Use your medication at regular intervals. Do not use your medication more often than directed. Talk to your care team about the use of this medication in children. While this medication may be used in children as young as 6 years for selected conditions, precautions do apply. Overdosage: If you think you have taken too much of this medicine contact a poison control center or emergency room at once. NOTE: This medicine is only for you. Do not share this medicine with others. What if I miss a dose? If you miss a dose, use it as soon as you can. If it is almost time for your next dose, use only that dose. Do not use double or extra doses. What may interact with this medication? Interactions are not expected. Do not use any other eye products without talking to your care team. This list may not describe all possible interactions. Give your health care provider a list of all the medicines, herbs, non-prescription drugs, or dietary supplements you use. Also tell them if you smoke, drink alcohol, or use illegal drugs. Some items may interact with your medicine. What should I watch for while using this medication? If you experience eye pain, changes in vision, continued redness or irritation of the eye, or if your eye condition gets worse or lasts longer than 72 hours, discontinue use and consult your care team. To avoid contamination of this product, do not touch the tip of the container to any surface. Do not  share this medication with others. If the product changes color or becomes cloudy, do not use. If you wear contact lenses, you should remove them before putting the drops in your eyes. Wait at least 15 minutes after  putting the drops in your eyes before putting your contact lenses back in. What side effects may I notice from receiving this medication? Side effects that you should report to your care team as soon as possible: Allergic reactions--skin rash, itching, hives, swelling of the face, lips, tongue, or throat Side effects that usually do not require medical attention (report to your care team if they continue or are bothersome): Blurry vision Eye irritation or itching This list may not describe all possible side effects. Call your doctor for medical advice about side effects. You may report side effects to FDA at 1-800-FDA-1088. Where should I keep my medication? Keep out of the reach of children and pets. Store at room temperature between 20 and 25 degrees C (68 and 77 degrees F). Do not freeze. Get rid of any unused medication after the expiration date. To get rid of medications that are no longer needed or have expired: Take the medication to a medication take-back program. Check with your pharmacy or law enforcement to find a location. If you cannot return the medication, check the label or package insert to see if the medication should be thrown out in the garbage or flushed down the toilet. If you are not sure, ask your care team. If it is safe to put it in the trash, empty the medication out of the container. Mix the medication with cat litter, dirt, coffee grounds, or other unwanted substance. Seal the mixture in a bag or container. Put it in the trash. NOTE: This sheet is a summary. It may not cover all possible information. If you have questions about this medicine, talk to your doctor, pharmacist, or health care provider.  2024 Elsevier/Gold Standard (2023-01-28 00:00:00)Sjogren's Syndrome Sjgren's syndrome is an inflammatory disease in which the body's disease-fighting system (immune system) attacks the glands that produce tears (lacrimal glands) and the glands that produce saliva (salivary  glands). This makes the eyes and mouth very dry. Sjgren's syndrome can also affect other parts of the body, causing dryness of the skin, nose, throat, and vagina. Sjgren's syndrome is a long-term (chronic) disorder that has no cure. In some cases, it is linked to other disorders (rheumatic disorders), such as rheumatoid arthritis and systemic lupus erythematosus (SLE). It may affect other parts of the body, such as the: Blood vessels. Joints. Lungs. Kidneys. Liver or pancreas. Brain, nerves, or spinal cord. What are the causes? The cause of this condition is not known. It may be passed along from parent to child (inherited), or it may be a symptom of a rheumatic disorder. What increases the risk? This condition is more likely to develop in: Women. People who are 22-51 years old and older. People who have recently had a viral infection or currently have a viral infection. What are the signs or symptoms? The main symptoms of this condition are: Dry mouth. This may include: A chalky feeling. Difficulty swallowing, speaking, or tasting. Frequent cavities in the teeth. Frequent mouth infections. Dry eyes. This may include: Burning, redness, and itching. Blurry vision. Fluctuating vision. Light sensitivity. Other symptoms may include: Dryness of the skin and the inside of the nose. Eyelid infections. Vaginal dryness (if applicable). Joint pain and stiffness. Muscle pain and stiffness. How is this diagnosed? This condition is diagnosed based  on: Your symptoms. Your medical history. A physical exam of your eyes and mouth. Tests, including: A Schirmer test. This tests your tear production. An eye exam that is done with a magnifying device (slit-lamp exam). An eye test that temporarily stains your eye with special dyes. This shows the extent of eye damage. Tests to check your salivary gland function. Biopsy. This is a removal of part of a salivary gland from inside your lower lip  to be studied under a microscope. Chest X-rays. Blood or urine tests. How is this treated? There is no cure for this condition, but treatment can help you manage your symptoms. You may be asked to see a rheumatologist for further evaluation and treatment. This condition may be treated with: Medicines to help relieve pain and stiffness. Medicines to help relieve inflammation in your body (corticosteroids). These are usually for severe cases. Medicines to help reduce the activity of your immune system (immunosuppressants). These are usually prescribed by your health care provider or a rheumatologist. Moisture replacement therapies to help relieve dryness in your skin, mouth, and eyes. Dry eyes may be treated with: Eye drops or nasal sprays to improve dryness of the eyes. Surgery or insertion of plugs to close the lacrimal glands (punctal occlusion). This helps keep more natural tears in your eyes. Soft contact lenses or hard scleral lenses. These are occasionally used to protect the surface of the eye. Biologic lubricating eye drops (serum tears). These are eye drops made from a person's own blood. They are used in some people with severe dry eye. Follow these instructions at home: Eye care  Use eye drops and other medicines as told by your health care provider. Protect your eyes from the sun and wind with sunglasses or glasses. Blink at least 5-6 times a minute. Maintain properly humidified air. You may want to use a humidifier at home and at work. Avoid smoke. Mouth care Brush your teeth and floss after every meal. Chew sugar-free gum or suck on hard candy. This may help to relieve dry mouth. Use antimicrobial mouthwash daily. Take frequent sips of water or sugar-free drinks. Use saliva substitutes or lip balm as told by your health care provider. See your dentist every 6 months. General instructions  Take over-the-counter and prescription medicines only as told by your health care  provider. Drink enough fluid to keep your urine pale yellow. Keep all follow-up visits. This is important. Contact a health care provider if: You have a fever. You have night sweats. You are always tired. You have unexplained weight loss. You develop itchy skin. You have red patches on your skin. You have a lump or swelling on your neck. Get help right away if: You develop severe eye pain. You develop sudden decreased vision. Summary Sjgren's syndrome is a disease in which the body's immune system attacks the glands that produce tears and the glands that produce saliva. This condition makes the eyes and mouth very dry. Sjgren's syndrome is a long-term (chronic) disorder. There is no cure for this condition, but treatment can help you manage your symptoms. The cause of this condition is not known. You may be asked to see a rheumatologist for further evaluation and treatment. This information is not intended to replace advice given to you by your health care provider. Make sure you discuss any questions you have with your health care provider. Document Revised: 01/07/2021 Document Reviewed: 01/07/2021 Elsevier Patient Education  2024 [employer] Inc.  Hand Exercises Hand exercises can be helpful for  almost anyone. They can strengthen your hands and improve flexibility and movement. The exercises can also increase blood flow to the hands. These results can make your work and daily tasks easier for you. Hand exercises can be especially helpful for people who have joint pain from arthritis or nerve damage from using their hands over and over. These exercises can also help people who injure a hand. Exercises Most of these hand exercises are gentle stretching and motion exercises. It is usually safe to do them often throughout the day. Warming up your hands before exercise may help reduce stiffness. You can do this with gentle massage or by placing your hands in warm water for 10-15  minutes. It is normal to feel some stretching, pulling, tightness, or mild discomfort when you begin new exercises. In time, this will improve. Remember to always be careful and stop right away if you feel sudden, very bad pain or your pain gets worse. You want to get better and be safe. Ask your health care provider which exercises are safe for you. Do exercises exactly as told by your provider and adjust them as told. Do not begin these exercises until told by your provider. Knuckle bend or claw fist  Stand or sit with your arm, hand, and all five fingers pointed straight up. Make sure to keep your wrist straight. Gently bend your fingers down toward your palm until the tips of your fingers are touching your palm. Keep your big knuckle straight and only bend the small knuckles in your fingers. Hold this position for 10 seconds. Straighten your fingers back to your starting position. Repeat this exercise 5-10 times with each hand. Full finger fist  Stand or sit with your arm, hand, and all five fingers pointed straight up. Make sure to keep your wrist straight. Gently bend your fingers into your palm until the tips of your fingers are touching the middle of your palm. Hold this position for 10 seconds. Extend your fingers back to your starting position, stretching every joint fully. Repeat this exercise 5-10 times with each hand. Straight fist  Stand or sit with your arm, hand, and all five fingers pointed straight up. Make sure to keep your wrist straight. Gently bend your fingers at the big knuckle, where your fingers meet your hand, and at the middle knuckle. Keep the knuckle at the tips of your fingers straight and try to touch the bottom of your palm. Hold this position for 10 seconds. Extend your fingers back to your starting position, stretching every joint fully. Repeat this exercise 5-10 times with each hand. Tabletop  Stand or sit with your arm, hand, and all five fingers  pointed straight up. Make sure to keep your wrist straight. Gently bend your fingers at the big knuckle, where your fingers meet your hand, as far down as you can. Keep the small knuckles in your fingers straight. Think of forming a tabletop with your fingers. Hold this position for 10 seconds. Extend your fingers back to your starting position, stretching every joint fully. Repeat this exercise 5-10 times with each hand. Finger spread  Place your hand flat on a table with your palm facing down. Make sure your wrist stays straight. Spread your fingers and thumb apart from each other as far as you can until you feel a gentle stretch. Hold this position for 10 seconds. Bring your fingers and thumb tight together again. Hold this position for 10 seconds. Repeat this exercise 5-10 times with each hand.  Making circles  Stand or sit with your arm, hand, and all five fingers pointed straight up. Make sure to keep your wrist straight. Make a circle by touching the tip of your thumb to the tip of your index finger. Hold for 10 seconds. Then open your hand wide. Repeat this motion with your thumb and each of your fingers. Repeat this exercise 5-10 times with each hand. Thumb motion  Sit with your forearm resting on a table and your wrist straight. Your thumb should be facing up toward the ceiling. Keep your fingers relaxed as you move your thumb. Lift your thumb up as high as you can toward the ceiling. Hold for 10 seconds. Bend your thumb across your palm as far as you can, reaching the tip of your thumb for the small finger (pinkie) side of your palm. Hold for 10 seconds. Repeat this exercise 5-10 times with each hand. Grip strengthening  Hold a stress ball or other soft ball in the middle of your hand. Slowly increase the pressure, squeezing the ball as much as you can without causing pain. Think of bringing the tips of your fingers into the middle of your palm. All of your finger joints should  bend when doing this exercise. Hold your squeeze for 10 seconds, then relax. Repeat this exercise 5-10 times with each hand. Contact a health care provider if: Your hand pain or discomfort gets much worse when you do an exercise. Your hand pain or discomfort does not improve within 2 hours after you exercise. If you have either of these problems, stop doing these exercises right away. Do not do them again unless your provider says that you can. Get help right away if: You develop sudden, severe hand pain or swelling. If this happens, stop doing these exercises right away. Do not do them again unless your provider says that you can. This information is not intended to replace advice given to you by your health care provider. Make sure you discuss any questions you have with your health care provider. Document Revised: 06/24/2022 Document Reviewed: 06/24/2022 Elsevier Patient Education  2024 [employer] Inc.    Exercises for Chronic Knee Pain Chronic knee pain is pain that lasts longer than 3 months. For most people with chronic knee pain, exercise and weight loss is an important part of treatment. Your health care provider may want you to focus on: Making the muscles that support your knee stronger. This can take pressure off your knee and reduce pain. Preventing knee stiffness. How far you can move your knee, keeping it there or making it farther. Losing weight (if this applies) to take pressure off your knee, lower your risk for injury, and make it easier for you to exercise. Your provider will help you make an exercise program that fits your needs and physical abilities. Below are simple, low-impact exercises you can do at home. Ask your provider or physical therapist how often you should do your exercise program and how many times to repeat each exercise. General safety tips  Get your provider's approval before doing any exercises. Start slowly and stop any time you feel pain. Do not  exercise if your knee pain is flaring up. Warm up first. Stretching a cold muscle can cause an injury. Do 5-10 minutes of easy movement or light stretching before beginning your exercises. Do 5-10 minutes of low-impact activity (like walking or cycling) before starting strengthening exercises. Contact your provider any time you have pain during or after exercising. Exercise  can cause discomfort but should not be painful. It is normal to be a little stiff or sore after exercising. Stretching and range-of-motion exercises Front thigh stretch  Stand up straight and support your body by holding on to a chair or resting one hand on a wall. With your legs straight and close together, bend one knee to lift your heel up toward your butt. Using one hand for support, grab your ankle with your free hand. Pull your foot up closer toward your butt to feel the stretch in front of your thigh. Hold the stretch for 30 seconds. Repeat __________ times. Complete this exercise __________ times a day. Back thigh stretch  Sit on the floor with your back straight and your legs out straight in front of you. Place the palms of your hands on the floor and slide them toward your feet as you bend at the hip. Try to touch your nose to your knees and feel the stretch in the back of your thighs. Hold for 30 seconds. Repeat __________ times. Complete this exercise __________ times a day. Calf stretch  Stand facing a wall. Place the palms of your hands flat against the wall, arms extended, and lean slightly against the wall. Get into a lunge position with one leg bent at the knee and the other leg stretched out straight behind you. Keep both feet facing the wall and increase the bend in your knee while keeping the heel of the other leg flat on the ground. You should feel the stretch in your calf. Hold for 30 seconds. Repeat __________ times. Complete this exercise __________ times a day. Strengthening exercises Straight  leg lift  Lie on your back with one knee bent and the other leg out straight. Slowly lift the straight leg without bending the knee. Lift until your foot is about 12 inches (30 cm) off the floor. Hold for 3-5 seconds and slowly lower your leg. Repeat __________ times. Complete this exercise __________ times a day. Single leg dip  Stand between two chairs and put both hands on the backs of the chairs for support. Extend one leg out straight with your body weight resting on the heel of the standing leg. Slowly bend your standing knee to dip your body to the level that is comfortable for you. Hold for 3-5 seconds. Repeat __________ times. Complete this exercise __________ times a day. Hamstring curls  Stand straight, knees close together, facing the back of a chair. Hold on to the back of a chair with both hands. Keep one leg straight. Bend the other knee while bringing the heel up toward the butt until the knee is bent at a 90-degree angle (right angle). Hold for 3-5 seconds. Repeat __________ times. Complete this exercise __________ times a day. Wall squat  Stand straight with your back, hips, and head against a wall. Step forward one foot at a time with your back still against the wall. Your feet should be 2 feet (61 cm) from the wall at shoulder width. Keeping your back, hips, and head against the wall, slide down the wall to as close to a sitting position as you can get. Hold for 5-10 seconds, then slowly slide back up. Repeat __________ times. Complete this exercise __________ times a day. Step-ups  Stand in front of a sturdy platform or stool that is about 6 inches (15 cm) high. Slowly step up with your left / right foot, keeping your knee in line with your hip and foot. Do not let  your knee bend so far that you cannot see your toes. Hold on to a chair for balance, but do not use it for support. Slowly unlock your knee and lower yourself to the starting position. Repeat  __________ times. Complete this exercise __________ times a day. Contact a health care provider if: Your exercises cause pain. Your pain is worse after you exercise. Your pain prevents you from doing your exercises. This information is not intended to replace advice given to you by your health care provider. Make sure you discuss any questions you have with your health care provider. Document Revised: 06/24/2022 Document Reviewed: 06/24/2022 Elsevier Patient Education  2024 [employer].

## 2024-04-11 ENCOUNTER — Ambulatory Visit
Admission: RE | Admit: 2024-04-11 | Discharge: 2024-04-11 | Disposition: A | Discharge status: Home / Self Care | Source: Ambulatory Visit | Attending: Internal Medicine | Admitting: Internal Medicine

## 2024-04-11 DIAGNOSIS — M7989 Other specified soft tissue disorders: Secondary | ICD-10-CM

## 2024-04-11 MED ORDER — GADOPICLENOL 0.5 MMOL/ML IV SOLN
8.0000 mL | Freq: Once | INTRAVENOUS | Status: AC | PRN
  Administered 2024-04-11: 8 mL via INTRAVENOUS

## 2024-04-14 ENCOUNTER — Encounter: Payer: Self-pay | Admitting: Internal Medicine

## 2024-04-14 ENCOUNTER — Ambulatory Visit: Admitting: Internal Medicine

## 2024-04-14 ENCOUNTER — Ambulatory Visit: Payer: Self-pay

## 2024-04-14 ENCOUNTER — Other Ambulatory Visit: Payer: Self-pay | Admitting: Internal Medicine

## 2024-04-14 VITALS — BP 138/98 | HR 78 | Temp 98.4°F | Ht 64.0 in | Wt 178.2 lb

## 2024-04-14 DIAGNOSIS — N182 Chronic kidney disease, stage 2 (mild): Secondary | ICD-10-CM

## 2024-04-14 DIAGNOSIS — M542 Cervicalgia: Secondary | ICD-10-CM

## 2024-04-14 DIAGNOSIS — D171 Benign lipomatous neoplasm of skin and subcutaneous tissue of trunk: Secondary | ICD-10-CM

## 2024-04-14 DIAGNOSIS — G44209 Tension-type headache, unspecified, not intractable: Secondary | ICD-10-CM | POA: Diagnosis not present

## 2024-04-14 DIAGNOSIS — I1310 Hypertensive heart and chronic kidney disease without heart failure, with stage 1 through stage 4 chronic kidney disease, or unspecified chronic kidney disease: Secondary | ICD-10-CM

## 2024-04-14 DIAGNOSIS — I700 Atherosclerosis of aorta: Secondary | ICD-10-CM

## 2024-04-14 MED ORDER — TIZANIDINE HCL 4 MG PO TABS: ORAL_TABLET | ORAL | 0 refills | Status: AC

## 2024-04-14 MED ORDER — FREESTYLE LIBRE 3 PLUS SENSOR MISC: 2 refills | Status: DC

## 2024-04-14 NOTE — Progress Notes (Signed)
 I,[name] [name], CMA,acting as a neurosurgeon for [name] LOISE Slocumb, MD.,have documented all relevant documentation on the behalf of [name] LOISE Slocumb, MD,as directed by  [name] LOISE Slocumb, MD while in the presence of [name] LOISE Slocumb, MD.  Subjective:  Patient ID: [name] [name] , female    DOB: 05-29-1951 , 73 y.o.   MRN: [id number]  Chief Complaint  Patient presents with   Headache    Patient presents today for constant headache. This initially started Wednesday. She completed MRI yesterday. She experiences stiffness & aching on the right side. She also has trouble with turning her head to the right.  Denies chest pain & sob, dizzines. Denies fever/chills.    HPI Discussed the use of AI scribe software for clinical note transcription with the patient, who gave verbal consent to proceed.  History of Present Illness [name] [name] is a 73 year old female who presents with headaches and neck stiffness.  She has been experiencing headaches for the last couple of days, described as a squeezing sensation like a 'crown on my head'. The headaches are persistent and are accompanied by stiffness on the left side of her neck, making it painful to turn her neck. No fever or chills. She is unsure about any recent exposure to illness, although she has visited a doctor's office for an MRI and attended church.  The onset of her symptoms coincided with an MRI, and she suspects that lifting a heavy retractable shade multiple times may have contributed to her symptoms. The pain radiates from her neck upwards. She has difficulty turning her head while driving.  She has a history of a benign lipoma, which was incidentally found during a CT scan in 2023 due to her smoking history. She quit smoking in 2015. The lipoma has grown, causing discomfort and social embarrassment, as it is now noticeable to others.  Her blood pressure was elevated during the visit, which she attributes to stress from her responsibilities at  church, including decorating and performing. She has been feeling overwhelmed and has decided to take a break from cooking this week to rest.   Hypertension This is a chronic problem. The current episode started more than 1 year ago. The problem has been gradually improving since onset. The problem is controlled. Associated symptoms include headaches and neck pain. Pertinent negatives include no palpitations. Past treatments include angiotensin blockers. The current treatment provides moderate improvement. Compliance problems include exercise and diet.  Hypertensive end-organ damage includes kidney disease.     Past Medical History:  Diagnosis Date   CKD (chronic kidney disease), stage II    COPD (chronic obstructive pulmonary disease) (HCC)    Coronary artery disease 2005   cardiologist--- dr edison;    cardiac cath for acute angina 05-21-2004 multivessel disease;  cath after GI consult 12/ 2005  PCTA  w/ DES to prox RCA ;  cath 04-20-2006 patent stent with nonobstruction CAD;  NUC 12/ 2013 normal no ischemia;  last ETT 07-30-2022 no ischemia   Diverticulosis of colon    Dyslipidemia    Full dentures    History of anemia    History of hepatitis C virus infection    per pt was dx many yrs ago and treated in 2017  (noted in epic HCV RNA quan not detectable 10-25-2019)   Hypertension    OSA (obstructive sleep apnea)    study in epic 03-06-2019  moderate osa , (08-14-2022  pt stated she tried  cpap  but just decided  to stop using it)   Osteoarthritis    PAD (peripheral artery disease)    Peripheral neuropathy    Prolapse of anterior vaginal wall    S/P primary angioplasty with coronary stent 05/2004   PTCA and DES x1 to pRCA   Type 2 diabetes mellitus treated with insulin  (HCC)    followed by pcp   (08-14-2022  per pt checks blood sugar multiple times dialy w/ dexcom 7,  fasting average 101)   Uterovaginal prolapse, incomplete    Wears glasses      Family History  Problem Relation  Age of Onset   CAD Mother 85   Heart attack Mother    CAD Father 21   Heart attack Father    CAD Sister    Cancer Sister    Heart disease Sister    CAD Brother    Cancer Brother        prostate   Heart attack Brother    CAD Brother    Drug abuse Granddaughter    Other Son        MVA   Lupus Daughter      Current Outpatient Medications:    amLODipine  (NORVASC ) 10 MG tablet, TAKE 1 TABLET(10 MG) BY MOUTH DAILY, Disp: 90 tablet, Rfl: 1   Ascorbic Acid  (VITAMIN C ) 500 MG CHEW, Chew by mouth daily., Disp: , Rfl:    aspirin  EC 81 MG tablet, Take 81 mg by mouth daily. Swallow whole., Disp: , Rfl:    Coenzyme Q10 (COQ-10) 100 MG CAPS, Take 1 tablet by mouth daily. , Disp: , Rfl:    Continuous Glucose Sensor (FREESTYLE LIBRE 3 PLUS SENSOR) MISC, Change sensor every 15 days. USE TO CHECK BLOOD SUGARS DAILY., Disp: 2 each, Rfl: 2   Cyanocobalamin  (VITAMIN B-12 PO), Take by mouth daily., Disp: , Rfl:    Dulaglutide  (TRULICITY ) 1.5 MG/0.5ML SOPN, Inject 1.5 mg into the skin once a week. Tuesday, Disp: 6 mL, Rfl: 3   FARXIGA  10 MG TABS tablet, Take 1 tablet (10 mg total) by mouth daily., Disp: 90 tablet, Rfl: 2   Glucagon  (GVOKE HYPOPEN  2-PACK) 0.5 MG/0.1ML SOAJ, USE AS DIRECTED FOR SUGAR LESS THAN 60, Disp: 0.2 mL, Rfl: 3   glucosamine-chondroitin 500-400 MG tablet, Take 1 tablet by mouth daily., Disp: , Rfl:    glucose blood (ONETOUCH VERIO) test strip, CHECK BLOOD SUGAR THREE TIMES DAILY dx: e11.65, Disp: 150 each, Rfl: 11   insulin  lispro (HUMALOG  KWIKPEN) 100 UNIT/ML KwikPen, Inject subcutaneously 2 units if sugars are 150 - 199, 4 units 200 -249, 6 units 250- 299, 8 units 300 - 349, 10 units >350, Disp: 15 mL, Rfl: 2   Insulin  Pen Needle (BD PEN NEEDLE NANO 2ND GEN) 32G X 4 MM MISC, USE TO INJECT INSULIN  DAILY, Disp: 100 each, Rfl: 2   MAGNESIUM OXIDE PO, Take 400 mg by mouth at bedtime., Disp: , Rfl:    metoprolol  tartrate (LOPRESSOR ) 50 MG tablet, TAKE 1 TABLET BY MOUTH TWICE DAILY,  Disp: 180 tablet, Rfl: 2   Misc Natural Products (NEURIVA) CAPS, Take 1 capsule by mouth daily., Disp: , Rfl:    mometasone  (NASONEX ) 50 MCG/ACT nasal spray, Place 2 sprays into the nose daily., Disp: 1 each, Rfl: 2   Multiple Vitamins-Minerals (ZINC PO), Take by mouth daily., Disp: , Rfl:    Omega-3 Fatty Acids (OMEGA 3 PO), Take by mouth daily., Disp: , Rfl:    omeprazole  (PRILOSEC) 20 MG capsule, TAKE 1 CAPSULE BY MOUTH  DAILY, Disp: 90 capsule, Rfl: 1   OneTouch Delica Lancets 33G MISC, USE AS DIRECTED TO CHECK BLOOD SUGAR THREE TIMES DAILY, Disp: 100 each, Rfl: 1   pregabalin  (LYRICA ) 50 MG capsule, Take 1 capsule (50 mg total) by mouth at bedtime., Disp: 90 capsule, Rfl: 2   rosuvastatin  (CRESTOR ) 40 MG tablet, Take 1 tablet (40 mg total) by mouth daily., Disp: 90 tablet, Rfl: 3   tiZANidine (ZANAFLEX) 4 MG tablet, One tab po qpm prn, Disp: 10 tablet, Rfl: 0   traZODone (DESYREL) 50 MG tablet, TAKE 1 TABLET BY MOUTH EVERY NIGHT AT BEDTIME, Disp: 90 tablet, Rfl: 1   Vitamin A 3 MG (10000 UT) TABS, Take 1 tablet by mouth daily. 2,400 mcg, Disp: , Rfl:    Vitamin D , Ergocalciferol , (DRISDOL ) 1.25 MG (50000 UNIT) CAPS capsule, Take 1 capsule (50,000 Units total) by mouth every 7 (seven) days., Disp: 12 capsule, Rfl: 1   vitamin E  600 UNIT capsule, Take 400 Units by mouth at bedtime., Disp: , Rfl:    nystatin  powder, Apply topically 3 (three) times daily. As needed (Patient not taking: Reported on 04/14/2024), Disp: 60 g, Rfl: 0   Allergies  Allergen Reactions   Fish Allergy Hives    Cod   Latex Rash     Review of Systems  Constitutional: Negative.   Respiratory: Negative.    Cardiovascular: Negative.  Negative for palpitations.  Gastrointestinal: Negative.   Musculoskeletal:  Positive for neck pain.  Neurological:  Positive for headaches.  Psychiatric/Behavioral: Negative.       Today's Vitals   04/14/24 1523 04/14/24 1607  BP: (!) 140/100 (!) 138/98  Pulse: 78   Temp: 98.4 F  (36.9 C)   SpO2: 98%   Weight: 178 lb 3.2 oz (80.8 kg)   Height: 5' 4 (1.626 m)    Body mass index is 30.59 kg/m.  Wt Readings from Last 3 Encounters:  04/14/24 178 lb 3.2 oz (80.8 kg)  04/05/24 175 lb 3.2 oz (79.5 kg)  03/07/24 173 lb 3.2 oz (78.6 kg)    BP Readings from Last 3 Encounters:  04/14/24 (!) 138/98  04/05/24 117/73  03/07/24 134/77     Objective:  Physical Exam Vitals and nursing note reviewed.  Constitutional:      Appearance: Normal appearance.  HENT:     Head: Normocephalic and atraumatic.  Eyes:     Extraocular Movements: Extraocular movements intact.  Neck:     Comments: tenderness Cardiovascular:     Rate and Rhythm: Normal rate and regular rhythm.     Heart sounds: Normal heart sounds.  Pulmonary:     Effort: Pulmonary effort is normal.     Breath sounds: Normal breath sounds.  Chest:     Comments:  Soft tissue mass left uppr back extending into left axilla Soft, mobile, no overlying erythema Roughly 6x6 inches Skin:    General: Skin is warm.  Neurological:     General: No focal deficit present.     Mental Status: She is alert.  Psychiatric:        Mood and Affect: Mood normal.        Behavior: Behavior normal.         Assessment And Plan:  Tension headache Assessment & Plan: Acute tension-type headache with neck muscle strain, likely due to stress and physical activity. Muscular origin, radiating from neck to head. - Prescribe low-dose muscle relaxant at night. - Apply pain patch to left shoulder. - Advise Vicks for  topical relief. - Instruct to message via MyChart if symptoms persist. - Advise rest and avoid heavy lifting.   Lipoma of torso Assessment & Plan: Benign lipoma confirmed by MRI, causing discomfort and noticeable to others. No muscle invasion. - Refer to surgeon for surgical evaluation and removal.  Orders: -     Ambulatory referral to General Surgery  Cervicalgia Assessment & Plan: Likely due to  musculoskeletal stiffness. Sx should resolve with use of muscle relaxer.     Hypertensive heart and renal disease with renal failure, stage 1 through stage 4 or unspecified chronic kidney disease, without heart failure Assessment & Plan: Chronic, uncontrolled.   Goal BP<120/80. Today's exacerbation likely due to pain. No med changes.   - She is encouraged to follow low sodium diet. She will c/w amlodipine  10mg   and metoprolol  daily. She is encouraged to avoid adding salt to her foods.    Atherosclerosis of aorta Assessment & Plan: Chronic, LDL goal < 70. She will c/w rosuvastatin  40mg  daily and ASA 81mg  daily. She is encouraged to follow a heart healthy lifestyle.    Chronic renal disease, stage II Assessment & Plan: Chronic, she is encouraged to keep BP well controlled and to stay well hydrated to decrease risk of CKD progression.     Other orders -     tiZANidine HCl; One tab po qpm prn  Dispense: 10 tablet; Refill: 0    Return if symptoms worsen or fail to improve.  Patient was given opportunity to ask questions. Patient verbalized understanding of the plan and was able to repeat key elements of the plan. All questions were answered to their satisfaction.   I, [name] LOISE Slocumb, MD, have reviewed all documentation for this visit. The documentation on 04/14/24 for the exam, diagnosis, procedures, and orders are all accurate and complete.   IF YOU HAVE BEEN REFERRED TO A SPECIALIST, IT MAY TAKE 1-2 WEEKS TO SCHEDULE/PROCESS THE REFERRAL. IF YOU HAVE NOT HEARD FROM US /SPECIALIST IN TWO WEEKS, PLEASE GIVE US  A CALL AT [phone] X [phone].   THE PATIENT IS ENCOURAGED TO PRACTICE SOCIAL DISTANCING DUE TO THE COVID-19 PANDEMIC.

## 2024-04-14 NOTE — Patient Instructions (Signed)
 Cluster Headache  A cluster headache is a type of primary headache that causes deep, intense head pain. Cluster headaches can last from 15 minutes to 3 hours. They usually occur on one side of the head or face. They may occur on the other side of the head when a new cluster of headaches starts. Often times, cluster headaches: Happen repeatedly over weeks to months. Happen many times a day. Occur at the same time of day, often at night. Happen in the fall and springtime. What are the causes? The cause of this condition is not known. Unlike migraine and tension headaches, a cluster headache is not usually related to triggers, such as foods, hormonal changes, or stress. What increases the risk? The following factors may make you likely to develop this condition: Being a female between the ages of 6-63 years old. Smoking or using products that contain nicotine or tobacco. Having high histamine levels. This can happen in people who have allergies. Taking medicines that cause blood vessels to get bigger, such as nitroglycerin. Having a parent or sibling who has cluster headaches. What are the signs or symptoms? Symptoms of this condition include: Very bad pain on one side of the head that begins behind or around your eye or temple. The pain may move to other areas of the face, head, and neck. Nausea. Sensitivity to light. Runny nose and nasal stuffiness. Sweating on the affected side of the face or forehead. Droopy or swollen eyelid, eye redness, or tearing on the affected side. Feeling restless or upset. Pale skin or reddened face (flushed). How is this diagnosed? Cluster headaches may be diagnosed based on: Your symptoms and the description of the attacks, including your pain level, where you feel the headaches, and when they start. How often your headaches happen and how long they last. A neurological exam to find physical signs of a neurological disorder. Your senses, reflexes, and nerves  will be tested. The exam is usually normal in people who have cluster headaches. Tests to rule out other medical conditions. Tests may include: MRI. CT scan. Blood tests. How is this treated? Cluster headaches may be treated with: Medicines to relieve pain and to prevent repeated attacks. Some people may need more than one medicine. Oxygen that is breathed in through a mask. This helps to relieve pain of an attack in 15-20 minutes. An injection near the base of the skull (occipital nerve block) with a steroid and numbing medicine. Surgery in very bad cases. Follow these instructions at home: Headache diary Keep a headache diary. Doing this can help you and your health care provider find out what triggers your headaches. In the diary, include: What time your headache started and what you were doing when it began. How long your headache lasted. Where your pain started and if it moved to other areas. The type of pain, such as burning, stabbing, throbbing, or cramping. Your pain level. Use a pain scale and rate the pain with a number from 1 (mild) up to 10 (severe). The treatment that you used, and any change in symptoms after treatment. Medicines Take over-the-counter and prescription medicines only as told by your provider. Ask your provider if the medicine prescribed to you: Requires you to avoid driving or using machinery. Can cause constipation. You may need to take these actions to prevent or treat constipation: Drink enough fluid to keep your pee (urine) pale yellow. Take over-the-counter or prescription medicines. Eat foods that are high in fiber, such as beans, whole  grains, and fresh fruits and vegetables. Limit foods that are high in fat and processed sugars, such as fried or sweet foods. Lifestyle Get 7-9 hours of sleep each night, or the amount recommended by your provider. Limit or manage stress. Talk to your provider about stress relief options, such as acupuncture,  counseling, biofeedback, and massage. Exercise regularly. Exercise for at least 30 minutes, 5 times each week. Moderate exercise may be best. Eat a healthy diet and avoid any foods that may trigger your headaches. Do not drink alcohol. Alcohol may quickly trigger a severe headache. Do not use any products that contain nicotine or tobacco. These products include cigarettes, chewing tobacco, and vaping devices, such as e-cigarettes. If you need help quitting, ask your provider. General instructions Use oxygen as told by your provider. Contact a health care provider if: Your headaches change, get worse, or happen more often. The medicine or oxygen that your provider recommends does not help. Get help right away if: You faint. You have weakness or numbness, especially on one side of your body or face. You have double vision. You have nausea or vomiting that does not go away after many hours. You have trouble talking, walking, or keeping your balance. You have pain or stiffness in your neck and you have a fever. This information is not intended to replace advice given to you by your health care provider. Make sure you discuss any questions you have with your health care provider. Document Revised: 02/03/2022 Document Reviewed: 02/03/2022 Elsevier Patient Education  2024 [employer].

## 2024-04-14 NOTE — Telephone Encounter (Signed)
 Copied from CRM [id number]. Topic: Clinical - Medication Refill >> Apr 14, 2024 12:30 PM Lauren C wrote: Medication: Continuous Glucose Sensor (FREESTYLE LIBRE 3 PLUS SENSOR) MISC   Has the patient contacted their pharmacy? Yes Too early for refill, but she states she was taking a shower and her bra strap pulled it off.  This is the patient's preferred pharmacy:  [id number] [place], [place] - [phone] AT [facility] OF [facility] & [address] [place] [address] Phone: [phone] Fax: [phone]  Is this the correct pharmacy for this prescription? Yes If no, delete pharmacy and type the correct one.   Has the prescription been filled recently? Yes  Is the patient out of the medication? Yes  Has the patient been seen for an appointment in the last year OR does the patient have an upcoming appointment? Yes  Can we respond through MyChart? Yes  Agent: Please be advised that Rx refills may take up to 3 business days. We ask that you follow-up with your pharmacy.

## 2024-04-14 NOTE — Telephone Encounter (Signed)
 FYI Only or Action Required?: FYI only for provider.  Patient was last seen in primary care on 02/29/2024 by Jarold Medici, MD.  Called Nurse Triage reporting No chief complaint on file..  Symptoms began several days ago.  Interventions attempted: Rest, hydration, or home remedies.  Symptoms are: unchanged.  Triage Disposition: See Physician Within 24 Hours  Patient/caregiver understands and will follow disposition?: Yes  Reason for Disposition  [1] MODERATE headache (e.g., interferes with normal activities) AND [2] present > 24 hours AND [3] unexplained  (Exceptions: Pain medicines not tried, typical migraine, or headache part of viral illness.)  Answer Assessment - Initial Assessment Questions Patient reports headache that began on 04/13/24, had MRI w/ contrast on 04/11/24. Described as 8/10 soreness. States has not taken anything OTC for the headache. States she has been increasing fluid intake per post contrast instructions.   1. LOCATION: Where does it hurt?      Frontal  2. ONSET: When did the headache start? (e.g., minutes, hours, days)      04/13/24  3. PATTERN: Does the pain come and go, or has it been constant since it started?     Constant  4. SEVERITY: How bad is the pain? and What does it keep you from doing?  (e.g., Scale 1-10; mild, moderate, or severe)     01/31/24  5. RECURRENT SYMPTOM: Have you ever had headaches before? If Yes, ask: When was the last time? and What happened that time?      Denies  6. CAUSE: What do you think is causing the headache?     Unknown  7. MIGRAINE: Have you been diagnosed with migraine headaches? If Yes, ask: Is this headache similar?      Denies  8. HEAD INJURY: Has there been any recent injury to your head?      Denies  Protocols used: [facility]   Message from [employer] C sent at 04/14/2024 12:32 PM EDT  Reason for Triage: Patient had MRI with contrast on Monday, since then she has been having  headaches.

## 2024-04-15 ENCOUNTER — Encounter: Payer: Self-pay | Admitting: Internal Medicine

## 2024-04-16 DIAGNOSIS — D171 Benign lipomatous neoplasm of skin and subcutaneous tissue of trunk: Secondary | ICD-10-CM | POA: Insufficient documentation

## 2024-04-16 DIAGNOSIS — M542 Cervicalgia: Secondary | ICD-10-CM | POA: Insufficient documentation

## 2024-04-16 DIAGNOSIS — G44209 Tension-type headache, unspecified, not intractable: Secondary | ICD-10-CM | POA: Insufficient documentation

## 2024-04-16 NOTE — Assessment & Plan Note (Signed)
 Chronic, uncontrolled.   Goal BP<120/80. Today's exacerbation likely due to pain. No med changes.   - She is encouraged to follow low sodium diet. She will c/w amlodipine  10mg   and metoprolol  daily. She is encouraged to avoid adding salt to her foods.

## 2024-04-16 NOTE — Assessment & Plan Note (Signed)
 Chronic, LDL goal < 70. She will c/w rosuvastatin  40mg  daily and ASA 81mg  daily. She is encouraged to follow a heart healthy lifestyle.

## 2024-04-16 NOTE — Assessment & Plan Note (Signed)
 Likely due to musculoskeletal stiffness. Sx should resolve with use of muscle relaxer.

## 2024-04-16 NOTE — Assessment & Plan Note (Signed)
 Chronic, she is encouraged to keep BP well controlled and to stay well hydrated to decrease risk of CKD progression.

## 2024-04-16 NOTE — Assessment & Plan Note (Addendum)
 Benign lipoma confirmed by MRI, causing discomfort and noticeable to others. No muscle invasion. - Refer to surgeon for surgical evaluation and removal.

## 2024-04-16 NOTE — Assessment & Plan Note (Signed)
 Acute tension-type headache with neck muscle strain, likely due to stress and physical activity. Muscular origin, radiating from neck to head. - Prescribe low-dose muscle relaxant at night. - Apply pain patch to left shoulder. - Advise Vicks for topical relief. - Instruct to message via MyChart if symptoms persist. - Advise rest and avoid heavy lifting.

## 2024-04-17 ENCOUNTER — Ambulatory Visit: Payer: Self-pay | Admitting: Internal Medicine

## 2024-04-25 ENCOUNTER — Encounter (HOSPITAL_COMMUNITY)
Admission: RE | Admit: 2024-04-25 | Discharge: 2024-04-25 | Disposition: A | Discharge status: Home / Self Care | Source: Ambulatory Visit | Attending: Rheumatology | Admitting: Rheumatology

## 2024-04-25 ENCOUNTER — Ambulatory Visit (HOSPITAL_COMMUNITY)
Admission: RE | Admit: 2024-04-25 | Discharge: 2024-04-25 | Disposition: A | Discharge status: Home / Self Care | Source: Ambulatory Visit | Attending: Rheumatology | Admitting: Rheumatology

## 2024-04-25 DIAGNOSIS — R748 Abnormal levels of other serum enzymes: Secondary | ICD-10-CM | POA: Insufficient documentation

## 2024-04-25 MED ORDER — TECHNETIUM TC 99M MEDRONATE IV KIT
20.0000 | PACK | Freq: Once | INTRAVENOUS | Status: AC | PRN
  Administered 2024-04-25: 20.3 via INTRAVENOUS

## 2024-04-26 ENCOUNTER — Ambulatory Visit: Payer: Self-pay | Admitting: Rheumatology

## 2024-04-26 NOTE — Progress Notes (Signed)
 Total body bone scan normal.  Osteoarthritic changes were noted in the knees and feet.

## 2024-05-05 ENCOUNTER — Other Ambulatory Visit: Payer: Self-pay | Admitting: Internal Medicine

## 2024-05-05 DIAGNOSIS — E1122 Type 2 diabetes mellitus with diabetic chronic kidney disease: Secondary | ICD-10-CM

## 2024-05-09 ENCOUNTER — Telehealth (HOSPITAL_BASED_OUTPATIENT_CLINIC_OR_DEPARTMENT_OTHER): Payer: Self-pay

## 2024-05-09 ENCOUNTER — Telehealth (HOSPITAL_BASED_OUTPATIENT_CLINIC_OR_DEPARTMENT_OTHER): Payer: Self-pay | Admitting: *Deleted

## 2024-05-09 NOTE — Telephone Encounter (Signed)
 Pt has been scheduled tele preop appt 05/16/24. Med rec and consent are done.

## 2024-05-09 NOTE — Telephone Encounter (Signed)
   Pre-operative Risk Assessment    Patient Name: [name] [name]  DOB: 07/09/1950 MRN: [id number]   Date of last office visit: 08/26/23 with Dr. Lavona Date of next office visit: NA   Request for Surgical Clearance    Procedure:  Back Mass Excision  Date of Surgery:  Clearance TBD                                 Surgeon:  Dr. Rubin Socks Group or Practice Name:  [facility] Phone number:  [phone] Fax number:  [phone]   Type of Clearance Requested:   - Medical  - Pharmacy:  Hold Aspirin  not indicated   Type of Anesthesia:  General    Additional requests/questions:    SignedAugustin JONETTA Daring   05/09/2024, 10:31 AM

## 2024-05-09 NOTE — Telephone Encounter (Signed)
 Pt has been scheduled tele preop appt 05/16/24. Med rec and consent are done.       Patient Consent for Virtual Visit        [name] [name] has provided verbal consent on 05/09/2024 for a virtual visit (video or telephone).   CONSENT FOR VIRTUAL VISIT FOR:  [name] [name]  By participating in this virtual visit I agree to the following:  I hereby voluntarily request, consent and authorize [facility] and its employed or contracted physicians, physician assistants, nurse practitioners or other licensed health care professionals (the Practitioner), to provide me with telemedicine health care services (the "Services) as deemed necessary by the treating Practitioner. I acknowledge and consent to receive the Services by the Practitioner via telemedicine. I understand that the telemedicine visit will involve communicating with the Practitioner through live audiovisual communication technology and the disclosure of certain medical information by electronic transmission. I acknowledge that I have been given the opportunity to request an in-person assessment or other available alternative prior to the telemedicine visit and am voluntarily participating in the telemedicine visit.  I understand that I have the right to withhold or withdraw my consent to the use of telemedicine in the course of my care at any time, without affecting my right to future care or treatment, and that the Practitioner or I may terminate the telemedicine visit at any time. I understand that I have the right to inspect all information obtained and/or recorded in the course of the telemedicine visit and may receive copies of available information for a reasonable fee.  I understand that some of the potential risks of receiving the Services via telemedicine include:  Delay or interruption in medical evaluation due to technological equipment failure or disruption; Information transmitted may not be sufficient (e.g. poor  resolution of images) to allow for appropriate medical decision making by the Practitioner; and/or  In rare instances, security protocols could fail, causing a breach of personal health information.  Furthermore, I acknowledge that it is my responsibility to provide information about my medical history, conditions and care that is complete and accurate to the best of my ability. I acknowledge that Practitioner's advice, recommendations, and/or decision may be based on factors not within their control, such as incomplete or inaccurate data provided by me or distortions of diagnostic images or specimens that may result from electronic transmissions. I understand that the practice of medicine is not an exact science and that Practitioner makes no warranties or guarantees regarding treatment outcomes. I acknowledge that a copy of this consent can be made available to me via my patient portal [facility] MyChart), or I can request a printed copy by calling the office of [facility].    I understand that my insurance will be billed for this visit.   I have read or had this consent read to me. I understand the contents of this consent, which adequately explains the benefits and risks of the Services being provided via telemedicine.  I have been provided ample opportunity to ask questions regarding this consent and the Services and have had my questions answered to my satisfaction. I give my informed consent for the services to be provided through the use of telemedicine in my medical care

## 2024-05-09 NOTE — Telephone Encounter (Signed)
   Name: [name] [name]  DOB: May 12, 1951  MRN: [id number]  Primary Cardiologist: Lynwood Schilling, MD   Preoperative team, please contact this patient and set up a phone call appointment for further preoperative risk assessment. Please obtain consent and complete medication review. Thank you for your help.  I confirm that guidance regarding antiplatelet and oral anticoagulation therapy has been completed and, if necessary, noted below.  Regarding ASA therapy, we recommend continuation of ASA throughout the perioperative period. However, if the surgeon feels that cessation of ASA is required in the perioperative period, it may be stopped 5-7 days prior to surgery with a plan to resume it as soon as felt to be feasible from a surgical standpoint in the post-operative period.    I also confirmed the patient resides in the state of [place] . As per [facility] Medical Board telemedicine laws, the patient must reside in the state in which the provider is licensed.   Josefa CHRISTELLA Beauvais, NP 05/09/2024, 1:55 PM [facility]

## 2024-05-10 ENCOUNTER — Ambulatory Visit: Admitting: Podiatry

## 2024-05-10 DIAGNOSIS — E1142 Type 2 diabetes mellitus with diabetic polyneuropathy: Secondary | ICD-10-CM

## 2024-05-10 DIAGNOSIS — M79675 Pain in left toe(s): Secondary | ICD-10-CM

## 2024-05-10 DIAGNOSIS — B351 Tinea unguium: Secondary | ICD-10-CM

## 2024-05-10 DIAGNOSIS — M79674 Pain in right toe(s): Secondary | ICD-10-CM | POA: Diagnosis not present

## 2024-05-10 DIAGNOSIS — Q828 Other specified congenital malformations of skin: Secondary | ICD-10-CM

## 2024-05-12 LAB — OPHTHALMOLOGY REPORT-SCANNED

## 2024-05-15 ENCOUNTER — Other Ambulatory Visit: Payer: Self-pay | Admitting: Internal Medicine

## 2024-05-16 ENCOUNTER — Encounter: Payer: Self-pay | Admitting: Podiatry

## 2024-05-16 ENCOUNTER — Ambulatory Visit: Attending: Cardiology | Admitting: Cardiology

## 2024-05-16 DIAGNOSIS — Z01810 Encounter for preprocedural cardiovascular examination: Secondary | ICD-10-CM | POA: Diagnosis not present

## 2024-05-16 DIAGNOSIS — Z01818 Encounter for other preprocedural examination: Secondary | ICD-10-CM

## 2024-05-16 NOTE — Progress Notes (Signed)
  Subjective:  Patient ID: [name] [name], female    DOB: 1951-03-31,  MRN: [id number]  [name] [name] presents to clinic today for at risk foot care with history of diabetic neuropathy and painful porokeratotic lesion(s) right foot and painful mycotic toenails that limit ambulation. Painful toenails interfere with ambulation. Aggravating factors include wearing enclosed shoe gear. Pain is relieved with periodic professional debridement. Painful porokeratotic lesions are aggravated when weightbearing with and without shoegear. Pain is relieved with periodic professional debridement.  Chief Complaint  Patient presents with   Diabetes    A1c 7.5 She saw Dr. Jarold in Oct.    New problem(s): None.   PCP is Jarold Medici, MD.  Allergies  Allergen Reactions   Fish Allergy Hives    Cod   Latex Rash    Review of Systems: Negative except as noted in the HPI.  Objective: No changes noted in today's physical examination. There were no vitals filed for this visit. [name] [name] is a pleasant 73 y.o. female WD, WN in NAD. AAO x 3.  Vascular Examination: Capillary refill time immediate b/l. Palpable pedal pulses. Pedal hair present b/l. Pedal edema absent. No pain with calf compression b/l. Skin temperature gradient WNL b/l. No cyanosis or clubbing b/l. No ischemia or gangrene noted b/l.   Neurological Examination: Pt has subjective symptoms of neuropathy. Sensation grossly intact b/l with 10 gram monofilament. Vibratory sensation intact b/l.   Dermatological Examination: Pedal skin with normal turgor, texture and tone b/l.  No open wounds. No interdigital macerations.   Toenails 1-5 b/l thick, discolored, elongated with subungual debris and pain on dorsal palpation.   Porokeratotic lesion(s) medial DIPJ of R 2nd toe and sub 5th met base right foot. No erythema, no edema, no drainage, no fluctuance.  Musculoskeletal Examination: Muscle strength 5/5 to all lower extremity muscle groups  bilaterally. Hallux valgus with bunion deformity noted right foot. Hammertoe(s) 2-5 b/l. Pes planus deformity noted bilateral LE.SABRA No pain, crepitus or joint limitation noted with ROM b/l LE.  Patient ambulates independently without assistive aids.  Radiographs: None  Assessment/Plan: 1. Pain due to onychomycosis of toenails of both feet   2. Porokeratosis   3. Type 2 diabetes mellitus with peripheral neuropathy [facility])   Consent given for treatment. Patient examined. All patient's and/or POA's questions/concerns addressed on today's visit. Toenails 1-5 b/l debrided in length and girth without incident. Porokeratotic lesion(s) submet head 5 right foot pared and enucleated with sharp debridement without incident. Continue foot and shoe inspections daily. Monitor blood glucose per PCP/Endocrinologist's recommendations.Continue soft, supportive shoe gear daily. Report any pedal injuries to medical professional. Call office if there are any questions/concerns. -Patient/POA to call should there be question/concern in the interim.   Return in about 3 months (around 08/10/2024).  [name] [name], DPM      [place] LOCATION: [address], [place] [address]                   Office [phone]   [name] LOCATION: [address] [place], [place] [address] Office [phone]

## 2024-05-16 NOTE — Progress Notes (Signed)
 Virtual Visit via Telephone Note   Because of [name] [name] co-morbid illnesses, she is at least at moderate risk for complications without adequate follow up.  This format is felt to be most appropriate for this patient at this time.  Due to technical limitations with video connection (technology), today's appointment will be conducted as an audio only telehealth visit, and [name] [name] verbally agreed to proceed in this manner.   All issues noted in this document were discussed and addressed.  No physical exam could be performed with this format.  Evaluation Performed:  Preoperative cardiovascular risk assessment _____________   Date:  05/16/2024   Patient ID:  [name] [name], DOB 01-27-1951, MRN [id number] Patient Location:  Home Provider location:   Office  Primary Care Provider:  Jarold Medici, MD Primary Cardiologist:  [name] Schilling, MD  Chief Complaint / Patient Profile   73 y.o. y/o female with a h/o CAD s/p PCI 2005, sinus bradycardia, dyslipidemia, DM2, hypertension, OSA intolerant of CPAP, who is pending back mass excision and presents today for telephonic preoperative cardiovascular risk assessment.  History of Present Illness    [name] [name] is a 73 y.o. female who presents via audio conferencing for a telehealth visit today.  Pt was last seen in cardiology clinic on 08/26/2023 by Dr. Schilling.  At that time [name] [name] was doing well.  The patient is now pending procedure as outlined above. Since her last visit, she has been doing well, she has no formal complaints from a cardiac perspective.  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     Past Medical History    Past Medical History:  Diagnosis Date   CKD (chronic kidney disease), stage II    COPD (chronic obstructive pulmonary disease) (HCC)     Coronary artery disease 2005   cardiologist--- dr edison;    cardiac cath for acute angina 05-21-2004 multivessel disease;  cath after GI consult 12/ 2005  PCTA  w/ DES to prox RCA ;  cath 04-20-2006 patent stent with nonobstruction CAD;  NUC 12/ 2013 normal no ischemia;  last ETT 07-30-2022 no ischemia   Diverticulosis of colon    Dyslipidemia    Full dentures    History of anemia    History of hepatitis C virus infection    per pt was dx many yrs ago and treated in 2017  (noted in epic HCV RNA quan not detectable 10-25-2019)   Hypertension    OSA (obstructive sleep apnea)    study in epic 03-06-2019  moderate osa , (08-14-2022  pt stated she tried  cpap  but just decided to stop using it)   Osteoarthritis    PAD (peripheral artery disease)    Peripheral neuropathy    Prolapse of anterior vaginal wall    S/P primary angioplasty with coronary stent 05/2004   PTCA and DES x1 to pRCA   Type 2 diabetes mellitus treated with insulin  (HCC)    followed by pcp   (08-14-2022  per pt checks blood sugar multiple times dialy w/ dexcom 7,  fasting average 101)   Uterovaginal prolapse, incomplete    Wears glasses    Past Surgical History:  Procedure Laterality Date   ANTERIOR AND POSTERIOR REPAIR WITH SACROSPINOUS FIXATION N/A 08/18/2022   Procedure: ANTERIOR AND POSTERIOR REPAIR, PERINEORRHAPHY WITH SACROSPINOUS FIXATION;  Surgeon: Marilynne Rosaline SAILOR, MD;  Location: [facility];  Service: Gynecology;  Laterality: N/A;  Total time requested is 1.5hrs   APPENDECTOMY  2003   BREAST EXCISIONAL BIOPSY Right    age 36s;   benign   CARDIAC CATHETERIZATION  05/21/2004   @[facility]  by dr s. debera;   acute angina;   multivessel disease;   pRCA 80%,  dRCA 50-60%,  dLM 40%,  ostialLAD 50-60%,  pCfx 30%,  LVEF 60%   CARDIAC CATHETERIZATION  04/20/2006   @[facility]  by dr w. wyn;  angina;  patent RCA stent,  minimal nonobstructive coronary disease   CARPAL TUNNEL RELEASE Right 2005   CATARACT  EXTRACTION W/ INTRAOCULAR LENS IMPLANT Bilateral 2022   COLONOSCOPY WITH PROPOFOL   10/12/2019   dr saintclair   CORONARY ANGIOPLASTY WITH STENT PLACEMENT  05/24/2004   @[facility]  by dr lelon. wyn;  after GI consult for bleed;   PTCA and DES x1  pRCA   CYSTOSCOPY N/A 08/18/2022   Procedure: CYSTOSCOPY;  Surgeon: Marilynne Rosaline SAILOR, MD;  Location: [facility];  Service: Gynecology;  Laterality: N/A;   TONSILLECTOMY     yrs ago    Allergies  Allergies  Allergen Reactions   Fish Allergy Hives    Cod   Latex Rash    Home Medications    Prior to Admission medications   Medication Sig Start Date End Date Taking? Authorizing Provider  amLODipine  (NORVASC ) 10 MG tablet TAKE 1 TABLET(10 MG) BY MOUTH DAILY 02/29/24   Jarold Medici, MD  Ascorbic Acid  (VITAMIN C ) 500 MG CHEW Chew by mouth daily.    [provider]  aspirin  EC 81 MG tablet Take 81 mg by mouth daily. Swallow whole.    [provider]  Coenzyme Q10 (COQ-10) 100 MG CAPS Take 1 tablet by mouth daily.     [provider]  Continuous Glucose Sensor (FREESTYLE LIBRE 3 PLUS SENSOR) MISC CHANGE SENSOR EVERY 15 DAYS USE  TO CHECK BLOOD SUGAR DAILY 05/05/24   Jarold Medici, MD  Cyanocobalamin  (VITAMIN B-12 PO) Take by mouth daily.    [provider]  Dulaglutide  (TRULICITY ) 1.5 MG/0.5ML SOPN Inject 1.5 mg into the skin once a week. Tuesday 03/05/23   Jarold Medici, MD  FARXIGA  10 MG TABS tablet Take 1 tablet (10 mg total) by mouth daily. 02/29/24   Jarold Medici, MD  Glucagon  (GVOKE HYPOPEN  2-PACK) 0.5 MG/0.1ML SOAJ USE AS DIRECTED FOR SUGAR LESS THAN 60 04/04/24   Georgina Speaks, FNP  glucosamine-chondroitin 500-400 MG tablet Take 1 tablet by mouth daily.    [provider]  glucose blood (ONETOUCH VERIO) test strip CHECK BLOOD SUGAR THREE TIMES DAILY dx: e11.65 03/23/18   Jarold Medici, MD  insulin  lispro (HUMALOG  KWIKPEN) 100 UNIT/ML KwikPen Inject subcutaneously 2 units if sugars are 150 -  199, 4 units 200 -249, 6 units 250- 299, 8 units 300 - 349, 10 units >350 12/30/23   Jarold Medici, MD  Insulin  Pen Needle (BD PEN NEEDLE NANO 2ND GEN) 32G X 4 MM MISC USE TO INJECT INSULIN  DAILY 08/11/23   Jarold Medici, MD  MAGNESIUM OXIDE PO Take 400 mg by mouth at bedtime.    [provider]  metoprolol  tartrate (LOPRESSOR ) 50 MG tablet TAKE 1 TABLET BY MOUTH TWICE DAILY 02/15/24   Jarold Medici, MD  Misc Natural Products (NEURIVA) CAPS Take 1 capsule by mouth daily.    [provider]  mometasone  (NASONEX ) 50 MCG/ACT nasal spray Place 2 sprays into the nose daily. 08/21/23  08/20/24  Petrina Pries, NP  Multiple Vitamins-Minerals (ZINC PO) Take by mouth daily.    [provider]  nystatin  powder Apply topically 3 (three) times daily. As needed 11/30/23   Jarold Medici, MD  Omega-3 Fatty Acids (OMEGA 3 PO) Take by mouth daily.    [provider]  omeprazole  (PRILOSEC) 20 MG capsule TAKE 1 CAPSULE BY MOUTH DAILY 11/09/23   Jarold Medici, MD  OneTouch Delica Lancets 33G MISC USE AS DIRECTED TO CHECK BLOOD SUGAR THREE TIMES DAILY 07/20/23   Jarold Medici, MD  pregabalin  (LYRICA ) 50 MG capsule Take 1 capsule (50 mg total) by mouth at bedtime. 01/19/24   Georgina Speaks, FNP  rosuvastatin  (CRESTOR ) 40 MG tablet Take 1 tablet (40 mg total) by mouth daily. 12/30/23   Jarold Medici, MD  tiZANidine  (ZANAFLEX ) 4 MG tablet One tab po qpm prn 04/14/24   Jarold Medici, MD  traZODone (DESYREL) 50 MG tablet TAKE 1 TABLET BY MOUTH EVERY NIGHT AT BEDTIME 11/09/23   Jarold Medici, MD  Vitamin A 3 MG (10000 UT) TABS Take 1 tablet by mouth daily. 2,400 mcg    [provider]  Vitamin D , Ergocalciferol , (DRISDOL ) 1.25 MG (50000 UNIT) CAPS capsule Take 1 capsule (50,000 Units total) by mouth every 7 (seven) days. 02/29/24   Jarold Medici, MD  vitamin E  600 UNIT capsule Take 400 Units by mouth at bedtime.    [provider]    Physical Exam    Vital Signs:  [name] [name] does not have vital signs available for review today.  Given telephonic nature of communication, physical exam is limited. AAOx3. NAD. Normal affect.  Speech and respirations are unlabored.  Accessory Clinical Findings    None  Assessment & Plan    1.  Preoperative Cardiovascular Risk Assessment:     [name] [name]'s perioperative risk of a major cardiac event is 0.9% according to the Revised Cardiac Risk Index (RCRI).  Therefore, she is at low risk for perioperative complications.   Her functional capacity is good at 5.07 METs according to the Duke Activity Status Index (DASI). Recommendations: According to ACC/AHA guidelines, no further cardiovascular testing needed.  The patient may proceed to surgery at acceptable risk.   Antiplatelet and/or Anticoagulation Recommendations: No request from physician to hold aspirin , preference is to continue throughout surgical procedure but if bleeding risk is felt to be too great, can be held for 5-7 days prior to surgery and resumed once orthostasis is achieved.   The patient was advised that if she develops new symptoms prior to surgery to contact our office to arrange for a follow-up visit, and she verbalized understanding.    A copy of this note will be routed to requesting surgeon.  Time:   Today, I have spent 10 minutes with the patient with telehealth technology discussing medical history, symptoms, and management plan.     Delon JAYSON Hoover, NP  05/16/2024, 8:14 AM

## 2024-06-06 ENCOUNTER — Telehealth: Payer: Self-pay | Admitting: Cardiology

## 2024-06-06 NOTE — Telephone Encounter (Signed)
 I spoke with patient.  She reports shortness of breath and issues with her right hand.  Was not having at time of recent telemedicine visit.  Patient reports she has been having numbness in her right hand for over a week. This morning she was having trouble gripping with her right hand. Improved as the day went on. Reports right hand is swollen.  Also has been having pain in right hand.  No facial droop, slurred speech or other extremity weakness.  Patient aware to call 911 if she develops any of these symptoms.  She has been having shortness of breath with activity for the last 3-4 days.  Occurred while walking into church yesterday.  Also had some shortness of breath this morning.  Improved with rest.  Has swelling in her ankles that comes and goes.  Does not weigh daily  No cough, no fever.  Negative covid test today. I offered patient appointment tomorrow but she is unable to be here tomorrow.  Appointment made for patient to see Hao Meng, PA on 12/31.   Patient has appointment with PCP on 12/24 and will discuss hand numbness and grip issues with provider at this appointment.

## 2024-06-06 NOTE — Telephone Encounter (Signed)
 Pt c/o Shortness Of Breath: STAT if SOB developed within the last 24 hours or pt is noticeably SOB on the phone  1. Are you currently SOB (can you hear that pt is SOB on the phone)? No  2. How long have you been experiencing SOB? 3-4 days   3. Are you SOB when sitting or when up moving around? Up moving   4. Are you currently experiencing any other symptoms? No   Pt complaining of being SOB for the past 3-4 days. She has been having episodes of SOB where she has to sit and rest. She has also been having some numbness of the right hand. Pt not SOB at time of call, but was earlier this morning. Please advise.

## 2024-06-07 ENCOUNTER — Ambulatory Visit: Admitting: Nurse Practitioner

## 2024-06-08 ENCOUNTER — Ambulatory Visit: Payer: Medicare HMO | Admitting: Internal Medicine

## 2024-06-08 ENCOUNTER — Ambulatory Visit: Payer: Medicare HMO

## 2024-06-08 ENCOUNTER — Ambulatory Visit: Payer: Self-pay

## 2024-06-14 NOTE — Progress Notes (Signed)
 I,[name] [name], CMA,acting as a neurosurgeon for [name] LOISE Slocumb, MD.,have documented all relevant documentation on the behalf of [name] LOISE Slocumb, MD,as directed by  [name] LOISE Slocumb, MD while in the presence of [name] LOISE Slocumb, MD.  Subjective:  Patient ID: [name] [name] , female    DOB: April 18, 1951 , 73 y.o.   MRN: [id number]  Chief Complaint  Patient presents with   Hypertension    Patient presents today for a diabetes & HTN check. Patient reports compliance with her meds. She denies headaches, chest pain and shortness of breath.  She has no specific concerns at this time.  AWV completed with [facility] advisor: Jiles.    Diabetes    HPI Discussed the use of AI scribe software for clinical note transcription with the patient, who gave verbal consent to proceed.  History of Present Illness [name] [name] is a 73 year old female with diabetes who presents for a routine diabetes check.  Her blood sugar levels are well-controlled, and she has no specific concerns regarding her diabetes management.  She experiences loose stools and fecal incontinence, describing them as 'loose' and sometimes 'a whole thing' but not liquidy. She feels an urgent need to defecate and must reach the bathroom quickly to avoid accidents.  She has swelling and pain in her hands, particularly affecting her ability to cook, with the right hand more affected than the left. She has been taking Tylenol  Arthritis for the pain but experienced stomach pain after taking it, which resolved when she stopped the medication. She is looking for copper gloves to help with the pain.  She mentions shortness of breath, which was a concern when she was being scheduled for surgery on January 26th. She has an appointment with a cardiologist on December 31st to address this issue.  She has been taking triazolam for sleep but reports that it only lasts for about three to four hours, after which she wakes up and cannot sleep properly.  She has  a history of bladder surgery, which improved her bladder function. However, she has not had a follow-up since earlier this year.  She avoids highways due to panic attacks following a vertigo attack and prefers taking back roads. The patient reports no trouble holding urine.   Patient presents today for a diabetes, HTN & cholesterol check. Patient reports compliance with her meds. She denies headaches, chest pain and shortness of breath.  She has no specific concerns at this time.       Diabetes She presents for her follow-up diabetic visit. She has type 2 diabetes mellitus. Her disease course has been improving. Pertinent negatives for hypoglycemia include no dizziness or headaches. Pertinent negatives for diabetes include no blurred vision, no chest pain, no polydipsia, no polyphagia and no polyuria. There are no hypoglycemic complications. Risk factors for coronary artery disease include diabetes mellitus, dyslipidemia, hypertension and post-menopausal. Current diabetic treatment includes insulin  injections. She is compliant with treatment most of the time. She is following a diabetic diet. She participates in exercise intermittently. Eye exam is current.  Hypertension This is a chronic problem. The current episode started more than 1 year ago. The problem has been gradually improving since onset. The problem is controlled. Pertinent negatives include no blurred vision, chest pain, headaches or palpitations. Past treatments include angiotensin blockers. The current treatment provides moderate improvement. Compliance problems include exercise and diet.  Hypertensive end-organ damage includes kidney disease. Identifiable causes of hypertension include chronic renal disease.  Hyperlipidemia This is a  chronic problem. The current episode started more than 1 year ago. Exacerbating diseases include chronic renal disease. Pertinent negatives include no chest pain. Current antihyperlipidemic treatment  includes statins. The current treatment provides moderate improvement of lipids. Risk factors for coronary artery disease include diabetes mellitus, dyslipidemia, hypertension, post-menopausal and a sedentary lifestyle.     Past Medical History:  Diagnosis Date   CKD (chronic kidney disease), stage II    COPD (chronic obstructive pulmonary disease) (HCC)    Coronary artery disease 2005   cardiologist--- dr edison;    cardiac cath for acute angina 05-21-2004 multivessel disease;  cath after GI consult 12/ 2005  PCTA  w/ DES to prox RCA ;  cath 04-20-2006 patent stent with nonobstruction CAD;  NUC 12/ 2013 normal no ischemia;  last ETT 07-30-2022 no ischemia   Diverticulosis of colon    Dyslipidemia    Full dentures    History of anemia    History of hepatitis C virus infection    per pt was dx many yrs ago and treated in 2017  (noted in epic HCV RNA quan not detectable 10-25-2019)   Hypertension    OSA (obstructive sleep apnea)    study in epic 03-06-2019  moderate osa , (08-14-2022  pt stated she tried  cpap  but just decided to stop using it)   Osteoarthritis    PAD (peripheral artery disease)    Peripheral neuropathy    Prolapse of anterior vaginal wall    S/P primary angioplasty with coronary stent 05/2004   PTCA and DES x1 to pRCA   Type 2 diabetes mellitus treated with insulin  (HCC)    followed by pcp   (08-14-2022  per pt checks blood sugar multiple times dialy w/ dexcom 7,  fasting average 101)   Uterovaginal prolapse, incomplete    Wears glasses      Family History  Problem Relation Age of Onset   CAD Mother 54   Heart attack Mother    CAD Father 87   Heart attack Father    CAD Sister    Cancer Sister    Heart disease Sister    CAD Brother    Cancer Brother        prostate   Heart attack Brother    CAD Brother    Drug abuse Granddaughter    Other Son        MVA   Lupus Daughter      Current Outpatient Medications:    meloxicam  (MOBIC ) 7.5 MG tablet, Take  1 tablet (7.5 mg total) by mouth daily. As needed for hand pain, Disp: 30 tablet, Rfl: 1   amLODipine  (NORVASC ) 10 MG tablet, TAKE 1 TABLET(10 MG) BY MOUTH DAILY, Disp: 90 tablet, Rfl: 1   Ascorbic Acid  (VITAMIN C ) 500 MG CHEW, Chew by mouth daily., Disp: , Rfl:    aspirin  EC 81 MG tablet, Take 81 mg by mouth daily. Swallow whole., Disp: , Rfl:    Coenzyme Q10 (COQ-10) 100 MG CAPS, Take 1 tablet by mouth daily. , Disp: , Rfl:    Continuous Glucose Sensor (FREESTYLE LIBRE 3 PLUS SENSOR) MISC, CHANGE SENSOR EVERY 15 DAYS USE  TO CHECK BLOOD SUGAR DAILY, Disp: 6 each, Rfl: 3   Cyanocobalamin  (VITAMIN B-12 PO), Take by mouth daily., Disp: , Rfl:    Dulaglutide  (TRULICITY ) 1.5 MG/0.5ML SOPN, Inject 1.5 mg into the skin once a week. Tuesday, Disp: 6 mL, Rfl: 3   FARXIGA  10 MG TABS tablet, Take 1  tablet (10 mg total) by mouth daily., Disp: 90 tablet, Rfl: 2   Glucagon  (GVOKE HYPOPEN  2-PACK) 0.5 MG/0.1ML SOAJ, USE AS DIRECTED FOR SUGAR LESS THAN 60, Disp: 0.2 mL, Rfl: 3   glucosamine-chondroitin 500-400 MG tablet, Take 1 tablet by mouth daily., Disp: , Rfl:    glucose blood (ONETOUCH VERIO) test strip, CHECK BLOOD SUGAR THREE TIMES DAILY dx: e11.65, Disp: 150 each, Rfl: 11   insulin  lispro (HUMALOG  KWIKPEN) 100 UNIT/ML KwikPen, Inject subcutaneously 2 units if sugars are 150 - 199, 4 units 200 -249, 6 units 250- 299, 8 units 300 - 349, 10 units >350, Disp: 15 mL, Rfl: 2   Insulin  Pen Needle (BD PEN NEEDLE NANO 2ND GEN) 32G X 4 MM MISC, USE TO INJECT INSULIN  DAILY, Disp: 100 each, Rfl: 2   MAGNESIUM OXIDE PO, Take 400 mg by mouth at bedtime., Disp: , Rfl:    metoprolol  tartrate (LOPRESSOR ) 50 MG tablet, TAKE 1 TABLET BY MOUTH TWICE DAILY, Disp: 180 tablet, Rfl: 2   Misc Natural Products (NEURIVA) CAPS, Take 1 capsule by mouth daily., Disp: , Rfl:    mometasone  (NASONEX ) 50 MCG/ACT nasal spray, Place 2 sprays into the nose daily., Disp: 1 each, Rfl: 2   Multiple Vitamins-Minerals (ZINC PO), Take by mouth  daily., Disp: , Rfl:    nystatin  powder, Apply topically 3 (three) times daily. As needed (Patient not taking: Reported on 06/15/2024), Disp: 60 g, Rfl: 0   Omega-3 Fatty Acids (OMEGA 3 PO), Take by mouth daily., Disp: , Rfl:    omeprazole  (PRILOSEC) 20 MG capsule, TAKE 1 CAPSULE BY MOUTH DAILY, Disp: 90 capsule, Rfl: 1   OneTouch Delica Lancets 33G MISC, USE AS DIRECTED TO CHECK BLOOD SUGAR THREE TIMES DAILY, Disp: 100 each, Rfl: 1   pregabalin  (LYRICA ) 50 MG capsule, Take 1 capsule (50 mg total) by mouth at bedtime., Disp: 90 capsule, Rfl: 2   rosuvastatin  (CRESTOR ) 40 MG tablet, Take 1 tablet (40 mg total) by mouth daily., Disp: 90 tablet, Rfl: 3   tiZANidine  (ZANAFLEX ) 4 MG tablet, One tab po qpm prn, Disp: 10 tablet, Rfl: 0   traZODone (DESYREL) 50 MG tablet, TAKE 1 TABLET BY MOUTH EVERY NIGHT AT BEDTIME, Disp: 90 tablet, Rfl: 1   Vitamin A 3 MG (10000 UT) TABS, Take 1 tablet by mouth daily. 2,400 mcg, Disp: , Rfl:    Vitamin D , Ergocalciferol , (DRISDOL ) 1.25 MG (50000 UNIT) CAPS capsule, Take 1 capsule (50,000 Units total) by mouth every 7 (seven) days., Disp: 12 capsule, Rfl: 1   vitamin E  600 UNIT capsule, Take 400 Units by mouth at bedtime., Disp: , Rfl:    Allergies  Allergen Reactions   Fish Allergy Hives    Cod   Latex Rash     Review of Systems  Constitutional: Negative.   Eyes:  Negative for blurred vision.  Respiratory: Negative.    Cardiovascular: Negative.  Negative for chest pain and palpitations.  Gastrointestinal:        C/o fecal incontinence  Endocrine: Negative for polydipsia, polyphagia and polyuria.  Musculoskeletal:  Positive for arthralgias.  Neurological: Negative.  Negative for dizziness and headaches.  Psychiatric/Behavioral: Negative.       Today's Vitals   06/15/24 0842  BP: 122/62  Pulse: (!) 53  Temp: 98.6 F (37 C)  SpO2: 98%  Weight: 184 lb (83.5 kg)  Height: 5' 4 (1.626 m)   Body mass index is 31.58 kg/m.  Wt Readings from Last 3  Encounters:  06/15/24 184 lb (83.5 kg)  06/15/24 184 lb 6.4 oz (83.6 kg)  04/14/24 178 lb 3.2 oz (80.8 kg)    The 10-year ASCVD risk score (Arnett DK, et al., 2019) is: 22.3%   Values used to calculate the score:     Age: 84 years     Clinically relevant sex: Female     Is Non-Hispanic African American: Yes     Diabetic: Yes     Tobacco smoker: No     Systolic Blood Pressure: 122 mmHg     Is BP treated: Yes     HDL Cholesterol: 63 mg/dL     Total Cholesterol: 144 mg/dL  Objective:  Physical Exam Vitals and nursing note reviewed.  Constitutional:      Appearance: Normal appearance.  HENT:     Head: Normocephalic and atraumatic.  Eyes:     Extraocular Movements: Extraocular movements intact.  Cardiovascular:     Rate and Rhythm: Normal rate and regular rhythm.     Heart sounds: Normal heart sounds.  Pulmonary:     Effort: Pulmonary effort is normal.     Breath sounds: Normal breath sounds.  Musculoskeletal:        General: Swelling present.     Cervical back: Normal range of motion.     Comments: Joint swelling b/l hands  Skin:    General: Skin is warm.  Neurological:     General: No focal deficit present.     Mental Status: She is alert.  Psychiatric:        Mood and Affect: Mood normal.        Behavior: Behavior normal.         Assessment And Plan:   Assessment & Plan Type 2 diabetes mellitus with stage 2 chronic kidney disease, with long-term current use of insulin  (HCC) Chronic, type 2 diabetes with chronic kidney disease. Blood sugar well-controlled.  Uses Libre for glucose monitoring. - Ensure prescription for Farxiga  is refilled and correct any pharmacy errors. - Use Libre for glucose monitoring. - Continue with Trulicity  weekly and Farxiga  10mg  daily.   Hypertensive heart and renal disease with renal failure, stage 1 through stage 4 or unspecified chronic kidney disease, without heart failure Chronic, well controlled.     - She is encouraged to follow  low sodium diet. She will c/w amlodipine  10mg   and metoprolol  daily. She is encouraged to avoid adding salt to her foods.  Atherosclerosis of aorta Chronic, LDL goal < 70. She will c/w rosuvastatin  40mg  daily and ASA 81mg  daily. She is encouraged to follow a heart healthy lifestyle.  Chronic renal disease, stage II Chronic, she is encouraged to keep BP well controlled and to stay well hydrated to decrease risk of CKD progression.   Incontinence of feces with fecal urgency Symptoms include loose stools and urgency since last October. Previous improvement with Metamucil. - Start Metamucil daily. - If symptoms persist after 1-2 weeks, initiate pelvic floor therapy. Bilateral hand pain Swelling and pain, especially in the right hand, affecting daily tasks. Tylenol  Arthritis caused stomach pain, resolved after stopping. - Recommend copper gloves for support. - Advise purchasing copper gloves from a medical supply store. - Continue Tylenol  with food if needed for pain management. - Trial of meloxicam , take once daily with food as needed. Pt is aware to avoid taking more than 3 - 5 days consecutively.    Orders Placed This Encounter  Procedures   CMP14+EGFR   Hemoglobin A1c   Ambulatory referral  to Physical Therapy     Return in 3 months (on 09/13/2024) for 1 year ov w thn & rs. resch jan to march for dm f/u.SABRA  Patient was given opportunity to ask questions. Patient verbalized understanding of the plan and was able to repeat key elements of the plan. All questions were answered to their satisfaction.    I, [name] LOISE Slocumb, MD, have reviewed all documentation for this visit. The documentation on 06/15/2024 for the exam, diagnosis, procedures, and orders are all accurate and complete.   IF YOU HAVE BEEN REFERRED TO A SPECIALIST, IT MAY TAKE 1-2 WEEKS TO SCHEDULE/PROCESS THE REFERRAL. IF YOU HAVE NOT HEARD FROM US /SPECIALIST IN TWO WEEKS, PLEASE GIVE US  A CALL AT [phone].

## 2024-06-14 NOTE — Patient Instructions (Addendum)
 [facility] [place] [phone])  Medical supply store [phone] Dr  [phone] Open  Closes 6?PM  Type 2 Diabetes Mellitus, Diagnosis, Adult Type 2 diabetes (type 2 diabetes mellitus) is a long-term (chronic) disease. It may happen when there is one or both of these problems: The pancreas does not make enough insulin . The body does not react in a normal way to insulin  that it makes. Insulin  lets sugars go into cells in your body. If you have type 2 diabetes, sugars cannot get into your cells. Sugars build up in the blood. This causes high blood sugar. What are the causes? The exact cause of this condition is not known. What increases the risk? Having type 2 diabetes in your family. Being overweight or very overweight. Not being active. Your body not reacting in a normal way to the insulin  it makes. Having higher than normal blood sugar over time. Having a type of diabetes when you were pregnant. Having a condition that causes small fluid-filled sacs on your ovaries. What are the signs or symptoms? At first, you may have no symptoms. You will get symptoms slowly. They may include: More thirst than normal. More hunger than normal. Needing to pee more than normal. Losing weight without trying. Feeling tired. Feeling weak. Seeing things blurry. Dark patches on your skin. How is this treated? This condition may be treated by a diabetes expert. You may need to: Follow an eating plan made by a food expert (dietitian). Get regular exercise. Find ways to deal with stress. Check blood sugar as often as told. Take medicines. Your doctor will set treatment goals for you. Your blood sugar should be at these levels: Before meals: 80-130 mg/dL (4.4-7.2 mmol/L). After meals: below 180 mg/dL (10 mmol/L). Over the last 2-3 months: less than 7%. Follow these instructions at home: Medicines Take your diabetes medicines or insulin  every day. Take medicines as told to help you  prevent other problems caused by this condition. You may need: Aspirin . Medicine to lower cholesterol. Medicine to control blood pressure. Questions to ask your doctor Should I meet with a diabetes educator? What medicines do I need, and when should I take them? What will I need to treat my condition at home? When should I check my blood sugar? Where can I find a support group? Who can I call if I have questions? When is my next doctor visit? General instructions Take over-the-counter and prescription medicines only as told by your doctor. Keep all follow-up visits. Where to find more information For help and guidance and more information about diabetes, please go to: American Diabetes Association (ADA): www.diabetes.org American Association of Diabetes Care and Education Specialists (ADCES): www.diabeteseducator.org International Diabetes Federation (IDF): dconly.dk Contact a doctor if: Your blood sugar is at or above 240 mg/dL (86.6 mmol/L) for 2 days in a row. You have been sick for 2 days or more, and you are not getting better. You have had a fever for 2 days or more, and you are not getting better. You have any of these problems for more than 6 hours: You cannot eat or drink. You feel like you may vomit. You vomit. You have watery poop (diarrhea). Get help right away if: Your blood sugar is lower than 54 mg/dL (3 mmol/L). You feel mixed up (confused). You have trouble thinking clearly. You have trouble breathing. You have medium or large ketone levels in your pee. These symptoms may be an emergency. Get help right away. Call your local emergency  services (911 in the [place] Do not wait to see if the symptoms will go away. Do not drive yourself to the hospital. Summary Type 2 diabetes is a long-term disease. Your pancreas may not make enough insulin , or your body may not react in a normal way to insulin  that it makes. This condition is treated with an eating plan,  lifestyle changes, and medicines. Your doctor will set treatment goals for you. These will help you keep your blood sugar in a healthy range. Keep all follow-up visits. This information is not intended to replace advice given to you by your health care provider. Make sure you discuss any questions you have with your health care provider. Document Revised: 09/03/2020 Document Reviewed: 09/03/2020 Elsevier Patient Education  2024 [employer].

## 2024-06-15 ENCOUNTER — Ambulatory Visit: Payer: Medicare HMO

## 2024-06-15 ENCOUNTER — Ambulatory Visit: Payer: Self-pay | Admitting: Internal Medicine

## 2024-06-15 ENCOUNTER — Encounter: Payer: Self-pay | Admitting: Internal Medicine

## 2024-06-15 VITALS — BP 122/62 | HR 53 | Temp 98.6°F | Ht 64.0 in | Wt 184.0 lb

## 2024-06-15 VITALS — BP 122/64 | HR 53 | Temp 98.6°F | Ht 64.0 in | Wt 184.4 lb

## 2024-06-15 DIAGNOSIS — Z794 Long term (current) use of insulin: Secondary | ICD-10-CM | POA: Diagnosis not present

## 2024-06-15 DIAGNOSIS — E7800 Pure hypercholesterolemia, unspecified: Secondary | ICD-10-CM

## 2024-06-15 DIAGNOSIS — R152 Fecal urgency: Secondary | ICD-10-CM

## 2024-06-15 DIAGNOSIS — I700 Atherosclerosis of aorta: Secondary | ICD-10-CM

## 2024-06-15 DIAGNOSIS — M542 Cervicalgia: Secondary | ICD-10-CM | POA: Diagnosis not present

## 2024-06-15 DIAGNOSIS — E1122 Type 2 diabetes mellitus with diabetic chronic kidney disease: Secondary | ICD-10-CM

## 2024-06-15 DIAGNOSIS — M79641 Pain in right hand: Secondary | ICD-10-CM | POA: Diagnosis not present

## 2024-06-15 DIAGNOSIS — Z0000 Encounter for general adult medical examination without abnormal findings: Secondary | ICD-10-CM | POA: Diagnosis not present

## 2024-06-15 DIAGNOSIS — M79642 Pain in left hand: Secondary | ICD-10-CM | POA: Diagnosis not present

## 2024-06-15 DIAGNOSIS — N182 Chronic kidney disease, stage 2 (mild): Secondary | ICD-10-CM

## 2024-06-15 DIAGNOSIS — I1310 Hypertensive heart and chronic kidney disease without heart failure, with stage 1 through stage 4 chronic kidney disease, or unspecified chronic kidney disease: Secondary | ICD-10-CM

## 2024-06-15 DIAGNOSIS — D171 Benign lipomatous neoplasm of skin and subcutaneous tissue of trunk: Secondary | ICD-10-CM

## 2024-06-15 DIAGNOSIS — R159 Full incontinence of feces: Secondary | ICD-10-CM

## 2024-06-15 LAB — HEMOGLOBIN A1C
Est. average glucose Bld gHb Est-mCnc: 180 mg/dL
Hgb A1c MFr Bld: 7.9 % — ABNORMAL HIGH (ref 4.8–5.6)

## 2024-06-15 LAB — CMP14+EGFR
ALT: 16 IU/L (ref 0–32)
AST: 19 IU/L (ref 0–40)
Albumin: 4.4 g/dL (ref 3.8–4.8)
Alkaline Phosphatase: 148 IU/L — ABNORMAL HIGH (ref 49–135)
BUN/Creatinine Ratio: 15 (ref 12–28)
BUN: 14 mg/dL (ref 8–27)
Bilirubin Total: 0.3 mg/dL (ref 0.0–1.2)
CO2: 24 mmol/L (ref 20–29)
Calcium: 9.4 mg/dL (ref 8.7–10.3)
Chloride: 101 mmol/L (ref 96–106)
Creatinine, Ser: 0.92 mg/dL (ref 0.57–1.00)
Globulin, Total: 2.8 g/dL (ref 1.5–4.5)
Glucose: 174 mg/dL — ABNORMAL HIGH (ref 70–99)
Potassium: 4.2 mmol/L (ref 3.5–5.2)
Sodium: 139 mmol/L (ref 134–144)
Total Protein: 7.2 g/dL (ref 6.0–8.5)
eGFR: 66 mL/min/1.73

## 2024-06-15 MED ORDER — MELOXICAM 7.5 MG PO TABS: 7.5000 mg | ORAL_TABLET | Freq: Every day | ORAL | 1 refills | Status: AC

## 2024-06-15 NOTE — Assessment & Plan Note (Addendum)
 Chronic, she is encouraged to keep BP well controlled and to stay well hydrated to decrease risk of CKD progression.

## 2024-06-15 NOTE — Assessment & Plan Note (Addendum)
 Chronic, LDL goal < 70. She will c/w rosuvastatin  40mg  daily and ASA 81mg  daily. She is encouraged to follow a heart healthy lifestyle.

## 2024-06-15 NOTE — Assessment & Plan Note (Addendum)
 Symptoms include loose stools and urgency since last October. Previous improvement with Metamucil. - Start Metamucil daily. - If symptoms persist after 1-2 weeks, initiate pelvic floor therapy.

## 2024-06-15 NOTE — Progress Notes (Signed)
 "  Chief Complaint  Patient presents with   Medicare Wellness     Subjective:   [name] [name] is a 73 y.o. female who presents for a Medicare Annual Wellness Visit.  Visit info / Clinical Intake: Medicare Wellness Visit Type:: Subsequent Annual Wellness Visit Persons participating in visit and providing information:: patient Medicare Wellness Visit Mode:: In-person (required for WTM) Interpreter Needed?: No Pre-visit prep was completed: yes AWV questionnaire completed by patient prior to visit?: no Living arrangements:: (!) lives alone Patient's Overall Health Status Rating: very good Typical amount of pain: some Does pain affect daily life?: (!) yes (sometimes) Are you currently prescribed opioids?: no  Dietary Habits and Nutritional Risks How many meals a day?: 3 Eats fruit and vegetables daily?: yes Most meals are obtained by: preparing own meals In the last 2 weeks, have you had any of the following?: (!) nausea, vomiting, diarrhea (loose stools) Diabetic:: (!) yes Any non-healing wounds?: no How often do you check your BS?: continuous glucose monitor Would you like to be referred to a Nutritionist or for Diabetic Management? : no  Functional Status Activities of Daily Living (to include ambulation/medication): Independent Ambulation: Independent Medication Administration: Independent Home Management (perform basic housework or laundry): Independent Manage your own finances?: yes Primary transportation is: driving Concerns about vision?: no *vision screening is required for WTM* Concerns about hearing?: no  Fall Screening Falls in the past year?: 0 Number of falls in past year: 0 Was there an injury with Fall?: 0 Fall Risk Category Calculator: 0 Patient Fall Risk Level: Low Fall Risk  Fall Risk Patient at Risk for Falls Due to: Medication side effect Fall risk Follow up: Falls prevention discussed; Falls evaluation completed  Home and Transportation  Safety: All rugs have non-skid backing?: yes All stairs or steps have railings?: yes Grab bars in the bathtub or shower?: yes Have non-skid surface in bathtub or shower?: yes Good home lighting?: yes Regular seat belt use?: yes Hospital stays in the last year:: no  Cognitive Assessment Difficulty concentrating, remembering, or making decisions? : no Will 6CIT or Mini Cog be Completed: yes What year is it?: 0 points What month is it?: 0 points Give patient an address phrase to remember (5 components): [address] [place] what time is it?: 3 points Count backwards from 20 to 1: 0 points Say the months of the year in reverse: 0 points Repeat the address phrase from earlier: 0 points 6 CIT Score: 3 points  Advance Directives (For Healthcare) Does Patient Have a Medical Advance Directive?: No Would patient like information on creating a medical advance directive?: No - Patient declined  Reviewed/Updated  Reviewed/Updated: Reviewed All (Medical, Surgical, Family, Medications, Allergies, Care Teams, Patient Goals)    Allergies (verified) Fish allergy and Latex   Current Medications (verified) Outpatient Encounter Medications as of 06/15/2024  Medication Sig   amLODipine  (NORVASC ) 10 MG tablet TAKE 1 TABLET(10 MG) BY MOUTH DAILY   Ascorbic Acid  (VITAMIN C ) 500 MG CHEW Chew by mouth daily.   aspirin  EC 81 MG tablet Take 81 mg by mouth daily. Swallow whole.   Coenzyme Q10 (COQ-10) 100 MG CAPS Take 1 tablet by mouth daily.    Continuous Glucose Sensor (FREESTYLE LIBRE 3 PLUS SENSOR) MISC CHANGE SENSOR EVERY 15 DAYS USE  TO CHECK BLOOD SUGAR DAILY   Cyanocobalamin  (VITAMIN B-12 PO) Take by mouth daily.   Dulaglutide  (TRULICITY ) 1.5 MG/0.5ML SOPN Inject 1.5 mg into the skin once a week. Tuesday  FARXIGA  10 MG TABS tablet Take 1 tablet (10 mg total) by mouth daily.   Glucagon  (GVOKE HYPOPEN  2-PACK) 0.5 MG/0.1ML SOAJ USE AS DIRECTED FOR SUGAR LESS THAN 60    glucosamine-chondroitin 500-400 MG tablet Take 1 tablet by mouth daily.   glucose blood (ONETOUCH VERIO) test strip CHECK BLOOD SUGAR THREE TIMES DAILY dx: e11.65   insulin  lispro (HUMALOG  KWIKPEN) 100 UNIT/ML KwikPen Inject subcutaneously 2 units if sugars are 150 - 199, 4 units 200 -249, 6 units 250- 299, 8 units 300 - 349, 10 units >350   Insulin  Pen Needle (BD PEN NEEDLE NANO 2ND GEN) 32G X 4 MM MISC USE TO INJECT INSULIN  DAILY   MAGNESIUM OXIDE PO Take 400 mg by mouth at bedtime.   metoprolol  tartrate (LOPRESSOR ) 50 MG tablet TAKE 1 TABLET BY MOUTH TWICE DAILY   Misc Natural Products (NEURIVA) CAPS Take 1 capsule by mouth daily.   mometasone  (NASONEX ) 50 MCG/ACT nasal spray Place 2 sprays into the nose daily.   Multiple Vitamins-Minerals (ZINC PO) Take by mouth daily.   Omega-3 Fatty Acids (OMEGA 3 PO) Take by mouth daily.   omeprazole  (PRILOSEC) 20 MG capsule TAKE 1 CAPSULE BY MOUTH DAILY   OneTouch Delica Lancets 33G MISC USE AS DIRECTED TO CHECK BLOOD SUGAR THREE TIMES DAILY   pregabalin  (LYRICA ) 50 MG capsule Take 1 capsule (50 mg total) by mouth at bedtime.   rosuvastatin  (CRESTOR ) 40 MG tablet Take 1 tablet (40 mg total) by mouth daily.   tiZANidine  (ZANAFLEX ) 4 MG tablet One tab po qpm prn   traZODone (DESYREL) 50 MG tablet TAKE 1 TABLET BY MOUTH EVERY NIGHT AT BEDTIME   Vitamin A 3 MG (10000 UT) TABS Take 1 tablet by mouth daily. 2,400 mcg   Vitamin D , Ergocalciferol , (DRISDOL ) 1.25 MG (50000 UNIT) CAPS capsule Take 1 capsule (50,000 Units total) by mouth every 7 (seven) days.   vitamin E  600 UNIT capsule Take 400 Units by mouth at bedtime.   nystatin  powder Apply topically 3 (three) times daily. As needed (Patient not taking: Reported on 06/15/2024)   No facility-administered encounter medications on file as of 06/15/2024.    History: Past Medical History:  Diagnosis Date   CKD (chronic kidney disease), stage II    COPD (chronic obstructive pulmonary disease) (HCC)     Coronary artery disease 2005   cardiologist--- dr edison;    cardiac cath for acute angina 05-21-2004 multivessel disease;  cath after GI consult 12/ 2005  PCTA  w/ DES to prox RCA ;  cath 04-20-2006 patent stent with nonobstruction CAD;  NUC 12/ 2013 normal no ischemia;  last ETT 07-30-2022 no ischemia   Diverticulosis of colon    Dyslipidemia    Full dentures    History of anemia    History of hepatitis C virus infection    per pt was dx many yrs ago and treated in 2017  (noted in epic HCV RNA quan not detectable 10-25-2019)   Hypertension    OSA (obstructive sleep apnea)    study in epic 03-06-2019  moderate osa , (08-14-2022  pt stated she tried  cpap  but just decided to stop using it)   Osteoarthritis    PAD (peripheral artery disease)    Peripheral neuropathy    Prolapse of anterior vaginal wall    S/P primary angioplasty with coronary stent 05/2004   PTCA and DES x1 to pRCA   Type 2 diabetes mellitus treated with insulin  (HCC)  followed by pcp   (08-14-2022  per pt checks blood sugar multiple times dialy w/ dexcom 7,  fasting average 101)   Uterovaginal prolapse, incomplete    Wears glasses    Past Surgical History:  Procedure Laterality Date   ANTERIOR AND POSTERIOR REPAIR WITH SACROSPINOUS FIXATION N/A 08/18/2022   Procedure: ANTERIOR AND POSTERIOR REPAIR, PERINEORRHAPHY WITH SACROSPINOUS FIXATION;  Surgeon: Marilynne Rosaline SAILOR, MD;  Location: [facility];  Service: Gynecology;  Laterality: N/A;  Total time requested is 1.5hrs   APPENDECTOMY  2003   BREAST EXCISIONAL BIOPSY Right    age 24s;   benign   CARDIAC CATHETERIZATION  05/21/2004   @[facility]  by dr s. debera;   acute angina;   multivessel disease;   pRCA 80%,  dRCA 50-60%,  dLM 40%,  ostialLAD 50-60%,  pCfx 30%,  LVEF 60%   CARDIAC CATHETERIZATION  04/20/2006   @[facility]  by dr w. wyn;  angina;  patent RCA stent,  minimal nonobstructive coronary disease   CARPAL TUNNEL RELEASE Right 2005   CATARACT  EXTRACTION W/ INTRAOCULAR LENS IMPLANT Bilateral 2022   COLONOSCOPY WITH PROPOFOL   10/12/2019   dr saintclair   CORONARY ANGIOPLASTY WITH STENT PLACEMENT  05/24/2004   @[facility]  by dr lelon. wyn;  after GI consult for bleed;   PTCA and DES x1  pRCA   CYSTOSCOPY N/A 08/18/2022   Procedure: CYSTOSCOPY;  Surgeon: Marilynne Rosaline SAILOR, MD;  Location: [facility];  Service: Gynecology;  Laterality: N/A;   TONSILLECTOMY     yrs ago   Family History  Problem Relation Age of Onset   CAD Mother 35   Heart attack Mother    CAD Father 70   Heart attack Father    CAD Sister    Cancer Sister    Heart disease Sister    CAD Brother    Cancer Brother        prostate   Heart attack Brother    CAD Brother    Drug abuse Granddaughter    Other Son        MVA   Lupus Daughter    Social History   Occupational History   Occupation: retired    Comment: disabled  Tobacco Use   Smoking status: Former    Current packs/day: 0.00    Average packs/day: 0.5 packs/day for 30.0 years (15.0 ttl pk-yrs)    Types: Cigarettes    Start date: 12/21/1983    Quit date: 12/20/2013    Years since quitting: 10.4    Passive exposure: Never   Smokeless tobacco: Never  Vaping Use   Vaping status: Never Used  Substance and Sexual Activity   Alcohol use: Not Currently   Drug use: Not Currently    Comment: quit 2013   Sexual activity: Not Currently    Birth control/protection: Post-menopausal   Tobacco Counseling Counseling given: Not Answered  SDOH Screenings   Food Insecurity: No Food Insecurity (06/15/2024)  Housing: Unknown (06/15/2024)  Transportation Needs: No Transportation Needs (06/15/2024)  Utilities: Not At Risk (06/15/2024)  Alcohol Screen: Low Risk (06/15/2024)  Depression (PHQ2-9): Low Risk (06/15/2024)  Financial Resource Strain: Low Risk (06/15/2024)  Physical Activity: Sufficiently Active (06/15/2024)  Social Connections: Moderately Integrated (06/15/2024)  Stress: No Stress  Concern Present (06/15/2024)  Tobacco Use: Medium Risk (06/15/2024)  Health Literacy: Adequate Health Literacy (06/15/2024)   See flowsheets for full screening details  Depression Screen PHQ 2 & 9 Depression Scale- Over the past 2 weeks, how often  have you been bothered by any of the following problems? Little interest or pleasure in doing things: 0 Feeling down, depressed, or hopeless (PHQ Adolescent also includes...irritable): 0 PHQ-2 Total Score: 0 Trouble falling or staying asleep, or sleeping too much: 0 Feeling tired or having little energy: 1 Poor appetite or overeating (PHQ Adolescent also includes...weight loss): 0 Feeling bad about yourself - or that you are a failure or have let yourself or your family down: 0 Trouble concentrating on things, such as reading the newspaper or watching television (PHQ Adolescent also includes...like school work): 0 Moving or speaking so slowly that other people could have noticed. Or the opposite - being so fidgety or restless that you have been moving around a lot more than usual: 0 Thoughts that you would be better off dead, or of hurting yourself in some way: 0 PHQ-9 Total Score: 1 If you checked off any problems, how difficult have these problems made it for you to do your work, take care of things at home, or get along with other people?: Not difficult at all  Depression Treatment Depression Interventions/Treatment : [id number] Score <4 Follow-up Not Indicated     Goals Addressed             This Visit's Progress    Patient Stated       06/15/2024, wants to start water aerobics             Objective:    Today's Vitals   06/15/24 0835  BP: 122/64  Pulse: (!) 53  Temp: 98.6 F (37 C)  TempSrc: Oral  SpO2: 95%  Weight: 184 lb 6.4 oz (83.6 kg)  Height: 5' 4 (1.626 m)   Body mass index is 31.65 kg/m.  Hearing/Vision screen Hearing Screening - Comments:: Denies hearing issues Vision Screening - Comments:: Regular eye  exam, [facility]  Immunizations and Health Maintenance Health Maintenance  Topic Date Due   COVID-19 Vaccine (8 - 2025-26 season) 02/22/2024   FOOT EXAM  07/07/2024   HEMOGLOBIN A1C  08/29/2024   DTaP/Tdap/Td (2 - Td or Tdap) 12/14/2024   Diabetic kidney evaluation - Urine ACR  02/28/2025   Diabetic kidney evaluation - eGFR measurement  03/01/2025   OPHTHALMOLOGY EXAM  05/12/2025   Medicare Annual Wellness (AWV)  06/15/2025   Mammogram  02/04/2026   Colonoscopy  10/11/2029   Pneumococcal Vaccine: 50+ Years  Completed   Influenza Vaccine  Completed   Bone Density Scan  Completed   Hepatitis C Screening  Completed   Zoster Vaccines- Shingrix  Completed   Meningococcal B Vaccine  Aged Out        Assessment/Plan:  This is a routine wellness examination for Carroll.  Patient Care Team: Jarold Medici, MD as PCP - General (Internal Medicine) Lavona Agent, MD as PCP - Cardiology (Cardiology) [facility]  I have personally reviewed and noted the following in the patients chart:   Medical and social history Use of alcohol, tobacco or illicit drugs  Current medications and supplements including opioid prescriptions. Functional ability and status Nutritional status Physical activity Advanced directives List of other physicians Hospitalizations, surgeries, and ER visits in previous 12 months Vitals Screenings to include cognitive, depression, and falls Referrals and appointments  No orders of the defined types were placed in this encounter.  In addition, I have reviewed and discussed with patient certain preventive protocols, quality metrics, and best practice recommendations. A written personalized care plan for preventive services as well as general preventive  health recommendations were provided to patient.   Ardella FORBES Dawn, LPN   [id number]   Return in 1 year (on 06/15/2025).  After Visit Summary: (In Person-Printed) AVS printed and given to the  patient  Nurse Notes: Vaccines not given: Will obtain covid at pharmacy  "

## 2024-06-15 NOTE — Assessment & Plan Note (Addendum)
 Chronic, well controlled.     - She is encouraged to follow low sodium diet. She will c/w amlodipine  10mg   and metoprolol  daily. She is encouraged to avoid adding salt to her foods.

## 2024-06-15 NOTE — Patient Instructions (Signed)
 [name] [name],  Thank you for taking the time for your Medicare Wellness Visit. I appreciate your continued commitment to your health goals. Please review the care plan we discussed, and feel free to reach out if I can assist you further.  Please note that Annual Wellness Visits do not include a physical exam. Some assessments may be limited, especially if the visit was conducted virtually. If needed, we may recommend an in-person follow-up with your provider.  Ongoing Care Seeing your primary care provider every 3 to 6 months helps us  monitor your health and provide consistent, personalized care.   Referrals If a referral was made during today's visit and you haven't received any updates within two weeks, please contact the referred provider directly to check on the status.  Recommended Screenings:  Health Maintenance  Topic Date Due   COVID-19 Vaccine (8 - 2025-26 season) 02/22/2024   Medicare Annual Wellness Visit  05/27/2024   Complete foot exam   07/07/2024   Hemoglobin A1C  08/29/2024   DTaP/Tdap/Td vaccine (2 - Td or Tdap) 12/14/2024   Yearly kidney health urinalysis for diabetes  02/28/2025   Yearly kidney function blood test for diabetes  03/01/2025   Eye exam for diabetics  05/12/2025   Breast Cancer Screening  02/04/2026   Colon Cancer Screening  10/11/2029   Pneumococcal Vaccine for age over 69  Completed   Flu Shot  Completed   Osteoporosis screening with Bone Density Scan  Completed   Hepatitis C Screening  Completed   Zoster (Shingles) Vaccine  Completed   Meningitis B Vaccine  Aged Out       06/15/2024    8:44 AM  Advanced Directives  Does Patient Have a Medical Advance Directive? No  Would patient like information on creating a medical advance directive? No - Patient declined    Vision: Annual vision screenings are recommended for early detection of glaucoma, cataracts, and diabetic retinopathy. These exams can also reveal signs of chronic conditions such as  diabetes and high blood pressure.  Dental: Annual dental screenings help detect early signs of oral cancer, gum disease, and other conditions linked to overall health, including heart disease and diabetes.  Please see the attached documents for additional preventive care recommendations.

## 2024-06-15 NOTE — Assessment & Plan Note (Addendum)
 Chronic, type 2 diabetes with chronic kidney disease. Blood sugar well-controlled.  Uses Libre for glucose monitoring. - Ensure prescription for Farxiga  is refilled and correct any pharmacy errors. - Use Libre for glucose monitoring. - Continue with Trulicity  weekly and Farxiga  10mg  daily.

## 2024-06-17 ENCOUNTER — Other Ambulatory Visit: Payer: Self-pay | Admitting: Internal Medicine

## 2024-06-21 ENCOUNTER — Ambulatory Visit: Payer: Self-pay | Admitting: Internal Medicine

## 2024-06-21 NOTE — Progress Notes (Unsigned)
 " Cardiology Office Note   Date:  06/22/2024  ID:  [name] [name], DOB [birth year], MRN [id number] PCP: Jarold Medici, MD  [facility] Providers Cardiologist:  Lynwood Schilling, MD     History of Present Illness [name] [name] is a 73 y.o. female with history of PAD, hypertension, hyperlipidemia, CAD s/p PCI to RCA, T2DM, anemia, and former tobacco use.      She has been followed by Dr. Schilling since 2013. She has previous history of PCI to RCA in 2005. In 2005 follow up cath she had 40% distal LM stenosis, ostial LAD 50-60% stenosis, and distal 50-60% in RCA with patent stent. She had stress test in 2013 demonstrated no evidence of ischemia and well-preserved EF. 01/2017 she had negative inadequate POET. She has had some atypical chest discomfort and dyspnea for years. Echo 04/19/2019 was done with results LVEF 60-65%, no LVH, normal RV, LA mildly dilated, and normal PA pressure. She had a repeat POET for surgical clearance 07/30/2022; results indicate submaximal with probability of obstructive coronary disease low. There was no high risk findings. She was last seen in office by Dr. Schilling, and appeared to be doing well from cardiac standpoint.  She was cleared for a back mass excision and was cleared via telephone visit 05/16/24. She called the office 06/06/24 with concerns of DOE and right hand weakness. She was afebrile and right hand appears to be swollen. She was seen by PCP 06/15/24 and noted right hand appears to be arthritic.     The patient presents today with concerns of shortness of breath. She had first episode of DOE when walking from her [place] into the church that improved with rest. She had another episode of DOE a few days ago when walking from her car into a store. 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 She also notes lightheadedness when bending over, that resolves with rest. She eats regular meals and drinks water throughout the day. She denies chest pain,  lower extremity edema, fatigue, palpitations, melena, hematuria, hemoptysis, diaphoresis, weakness, presyncope, syncope, orthopnea, and PND.  ROS: All systems negative unless otherwise indicated in HPI.  Studies Reviewed EKG Interpretation Date/Time:  Wednesday June 22 2024 14:07:30 EST Ventricular Rate:  73 PR Interval:  206 QRS Duration:  82 QT Interval:  426 QTC Calculation: 469 R Axis:   -29  Text Interpretation: Sinus rhythm with frequent Premature ventricular complexes Anterior infarct (cited on or before 26-Aug-2023) When compared with ECG of 26-Aug-2023 08:55, Premature ventricular complexes are now Present Confirmed by Teresa Fish [id number]) on 06/22/2024 2:10:58 PM    Cardiac Studies & Procedures   ______________________________________________________________________________________________   STRESS TESTS  EXERCISE TOLERANCE TEST (ETT) 07/30/2022  Interpretation Summary   No ST deviation was noted.   Prior study available for comparison from 02/06/2017. Non-diagnostic  Submaximal stress test test. Failure to achieve target HR. Compared to a prior study in 2018, that was also a submaximal stress test. PAC's and PVC's were noted.  No chest pain was reported.   ECHOCARDIOGRAM  ECHOCARDIOGRAM COMPLETE 04/19/2019  Narrative ECHOCARDIOGRAM REPORT    Patient Name:   [name] [name] Date of Exam: 04/19/2019 Medical Rec #:  [id number]      Height:       63.5 in Accession #:    [id number]     Weight:       194.6 lb Date of Birth:  1950/08/06      BSA:  1.92 m Patient Age:    73 years       BP:           148/86 mmHg Patient Gender: F              HR:           59 bpm. Exam Location:  [address]  Procedure: 2D Echo, Cardiac Doppler, Color Doppler, Strain Analysis and 3D Echo  Indications:    R06.00 SOB  History:        Patient has no prior history of Echocardiogram examinations. CAD Risk Factors:Dyslipidemia, Diabetes, Sleep Apnea  and Hypertension.  Sonographer:    Waldo Guadalajara RCS Referring Phys: [id number] ROBYN SANDERS  IMPRESSIONS   1. Left ventricular ejection fraction, by visual estimation, is 60 to 65%. The left ventricle has normal function. Normal left ventricular size. There is no left ventricular hypertrophy. 2. Left ventricular diastolic Doppler parameters are consistent with pseudonormalization pattern of LV diastolic filling. 3. Global right ventricle has normal systolic function.The right ventricular size is normal. No increase in right ventricular wall thickness. 4. Left atrial size was mildly dilated. 5. Right atrial size was normal. 6. The mitral valve is normal in structure. No evidence of mitral valve regurgitation. No evidence of mitral stenosis. 7. The tricuspid valve is normal in structure. Tricuspid valve regurgitation is trivial. 8. The aortic valve is tricuspid Aortic valve regurgitation was not visualized by color flow Doppler. Mild aortic valve sclerosis without stenosis. 9. The pulmonic valve was grossly normal. Pulmonic valve regurgitation is mild by color flow Doppler. 10. Normal pulmonary artery systolic pressure. 11. The inferior vena cava is normal in size with greater than 50% respiratory variability, suggesting right atrial pressure of 3 mmHg.  FINDINGS Left Ventricle: Left ventricular ejection fraction, by visual estimation, is 60 to 65%. The left ventricle has normal function. There is no left ventricular hypertrophy. Normal left ventricular size. Spectral Doppler shows Left ventricular diastolic Doppler parameters are consistent with pseudonormalization pattern of LV diastolic filling.  Right Ventricle: The right ventricular size is normal. No increase in right ventricular wall thickness. Global RV systolic function is has normal systolic function. The tricuspid regurgitant velocity is 2.51 m/s, and with an assumed right atrial pressure of 3 mmHg, the estimated right ventricular  systolic pressure is normal at 28.2 mmHg.  Left Atrium: Left atrial size was mildly dilated.  Right Atrium: Right atrial size was normal in size  Pericardium: There is no evidence of pericardial effusion.  Mitral Valve: The mitral valve is normal in structure. No evidence of mitral valve stenosis by observation. No evidence of mitral valve regurgitation.  Tricuspid Valve: The tricuspid valve is normal in structure. Tricuspid valve regurgitation is trivial by color flow Doppler.  Aortic Valve: The aortic valve is tricuspid. Aortic valve regurgitation was not visualized by color flow Doppler. Mild aortic valve sclerosis is present, with no evidence of aortic valve stenosis.  Pulmonic Valve: The pulmonic valve was grossly normal. Pulmonic valve regurgitation is mild by color flow Doppler.  Aorta: The aortic root, ascending aorta and aortic arch are all structurally normal, with no evidence of dilitation or obstruction.  Pulmonary Artery: The pulmonary artery is not well seen.  Venous: The inferior vena cava is normal in size with greater than 50% respiratory variability, suggesting right atrial pressure of 3 mmHg.  IAS/Shunts: No atrial level shunt detected by color flow Doppler. No ventricular septal defect is seen or detected. There is no evidence of an  atrial septal defect.    LEFT VENTRICLE PLAX 2D LVIDd:         4.48 cm  Diastology LVIDs:         3.05 cm  LV e' lateral:   7.29 cm/s LV PW:         1.33 cm  LV E/e' lateral: 12.4 LV IVS:        0.91 cm  LV e' medial:    5.66 cm/s LVOT diam:     2.10 cm  LV E/e' medial:  16.0 LV SV:         55 ml LV SV Index:   27.17    2D Longitudinal Strain LVOT Area:     3.46 cm 2D Strain GLS (A2C):   -20.8 % 2D Strain GLS (A3C):   -15.4 % 2D Strain GLS (A4C):   -20.2 % 2D Strain GLS Avg:     -18.8 %  RIGHT VENTRICLE RV Basal diam:  3.66 cm RV S prime:     12.10 cm/s TAPSE (M-mode): 2.9 cm RVSP:           28.2 mmHg  LEFT ATRIUM              Index       RIGHT ATRIUM           Index LA diam:        3.00 cm 1.56 cm/m  RA Pressure: 3.00 mmHg LA Vol (A2C):   67.9 ml 35.32 ml/m RA Area:     17.30 cm LA Vol (A4C):   59.6 ml 31.00 ml/m RA Volume:   45.40 ml  23.61 ml/m LA Biplane Vol: 64.8 ml 33.71 ml/m AORTIC VALVE LVOT Vmax:   82.00 cm/s LVOT Vmean:  59.800 cm/s LVOT VTI:    0.224 m  AORTA Ao Root diam: 3.20 cm  MITRAL VALVE                        TRICUSPID VALVE MV Area (PHT):                      TR Peak grad:   25.2 mmHg MV PHT:                             TR Vmax:        290.00 cm/s MV Decel Time: 187 msec             Estimated RAP:  3.00 mmHg MV E velocity: 90.70 cm/s 103 cm/s  RVSP:           28.2 mmHg MV A velocity: 80.50 cm/s 70.3 cm/s MV E/A ratio:  1.13       1.5       SHUNTS Systemic VTI:  0.22 m Systemic Diam: 2.10 cm   Shelda Bruckner MD Electronically signed by Shelda Bruckner MD Signature Date/Time: 04/19/2019/12:51:36 PM    Final          ______________________________________________________________________________________________      Risk Assessment/Calculations           Physical Exam VS:  BP 132/77 (BP Location: Left Arm, Patient Position: Sitting, Cuff Size: Normal)   Pulse 62   Resp 16   Ht 5' 4 (1.626 m)   Wt 173 lb (78.5 kg)   SpO2 96%   BMI 29.70 kg/m        Wt Readings from Last 3  Encounters:  06/22/24 173 lb (78.5 kg)  06/15/24 184 lb (83.5 kg)  06/15/24 184 lb 6.4 oz (83.6 kg)    GEN: Well nourished, well developed in no acute distress NECK: No JVD; No carotid bruits CARDIAC: RRR, no murmurs, rubs, gallops RESPIRATORY:  Clear to auscultation without rales, wheezing or rhonchi  ABDOMEN: Soft, non-tender, non-distended EXTREMITIES:  No edema; No deformity   ASSESSMENT AND PLAN  CAD s/p PCI to RCA- She has previous history of PCI to RCA in 2005. In 2005 follow up cath she had 40% distal LM stenosis, ostial LAD 50-60% stenosis, and distal  50-60% in RCA with patent stent. Echo 04/19/2019 was done with results LVEF 60-65%, no LVH, normal RV, LA mildly dilated, and normal PA pressure. EKG today shows NSR with multiple PVC's. Multiple episodes of DOE when walking from parking lot. Denies CP, dizziness, and LE edema. Repeat Echo. Cardiac PET ordered prior to surgery. If EF is down and cardiac PET normal, can consider heart monitor. Continue aspirin  81 mg, metoprolol  tartrate 50 mg BID, and rosuvastatin  40 mg.  Hypertension- BP remains controlled in office. Continue amlodipine  10 mg and metoprolol  tartrate 50 mg BID.   HLD LDL goal <70- Last LDL 69 0/0/7974. Continue rosuvastatin  40 mg.   T2DM- Last A1C 7.9 06/15/24. Continue to follow with PCP.   History of anemia- Received iron transfusions in the past. Last CBC 05/2023 WNL. Repeat CBC today.     Informed Consent   Shared Decision Making/Informed Consent The risks [chest pain, shortness of breath, cardiac arrhythmias, dizziness, blood pressure fluctuations, myocardial infarction, stroke/transient ischemic attack, nausea, vomiting, allergic reaction, radiation exposure, metallic taste sensation and life-threatening complications (estimated to be 1 in 10,000)], benefits (risk stratification, diagnosing coronary artery disease, treatment guidance) and alternatives of a cardiac PET stress test were discussed in detail with Ms. Sabatino and she agrees to proceed.     Dispo: Follow up with APP following Cardiac PET.   Signed, Mardy KATHEE Pizza, FNP  "

## 2024-06-22 ENCOUNTER — Ambulatory Visit

## 2024-06-22 ENCOUNTER — Other Ambulatory Visit: Payer: Self-pay

## 2024-06-22 ENCOUNTER — Encounter: Payer: Self-pay | Admitting: Physician Assistant

## 2024-06-22 VITALS — BP 132/77 | HR 62 | Resp 16 | Ht 64.0 in | Wt 173.0 lb

## 2024-06-22 DIAGNOSIS — I2510 Atherosclerotic heart disease of native coronary artery without angina pectoris: Secondary | ICD-10-CM

## 2024-06-22 DIAGNOSIS — E785 Hyperlipidemia, unspecified: Secondary | ICD-10-CM | POA: Diagnosis not present

## 2024-06-22 DIAGNOSIS — I10 Essential (primary) hypertension: Secondary | ICD-10-CM | POA: Diagnosis not present

## 2024-06-22 DIAGNOSIS — R0609 Other forms of dyspnea: Secondary | ICD-10-CM | POA: Diagnosis not present

## 2024-06-22 DIAGNOSIS — E118 Type 2 diabetes mellitus with unspecified complications: Secondary | ICD-10-CM | POA: Diagnosis not present

## 2024-06-22 DIAGNOSIS — Z862 Personal history of diseases of the blood and blood-forming organs and certain disorders involving the immune mechanism: Secondary | ICD-10-CM | POA: Diagnosis not present

## 2024-06-22 LAB — CBC
Hematocrit: 43.8 % (ref 34.0–46.6)
Hemoglobin: 13.1 g/dL (ref 11.1–15.9)
MCH: 23.1 pg — ABNORMAL LOW (ref 26.6–33.0)
MCHC: 29.9 g/dL — ABNORMAL LOW (ref 31.5–35.7)
MCV: 77 fL — ABNORMAL LOW (ref 79–97)
Platelets: 250 x10E3/uL (ref 150–450)
RBC: 5.67 x10E6/uL — ABNORMAL HIGH (ref 3.77–5.28)
RDW: 17.4 % — ABNORMAL HIGH (ref 11.7–15.4)
WBC: 5.7 x10E3/uL (ref 3.4–10.8)

## 2024-06-22 NOTE — Patient Instructions (Addendum)
 Medication Instructions:  Your physician recommends that you continue on your current medications as directed. Please refer to the Current Medication list given to you today.  *If you need a refill on your cardiac medications before your next appointment, please call your pharmacy*  Lab Work: CBC If you have labs (blood work) drawn today and your tests are completely normal, you will receive your results only by: MyChart Message (if you have MyChart) OR A paper copy in the mail If you have any lab test that is abnormal or we need to change your treatment, we will call you to review the results.  Testing/Procedures: ECHOCARDIOGRAM  Your physician has requested that you have an echocardiogram. Echocardiography is a painless test that uses sound waves to create images of your heart. It provides your doctor with information about the size and shape of your heart and how well your hearts chambers and valves are working. This procedure takes approximately one hour. There are no restrictions for this procedure. Please do NOT wear cologne, perfume, aftershave, or lotions (deodorant is allowed). Please arrive 15 minutes prior to your appointment time.  Please note: We ask at that you not bring children with you during ultrasound (echo/ vascular) testing. Due to room size and safety concerns, children are not allowed in the ultrasound rooms during exams. Our front office staff cannot provide observation of children in our lobby area while testing is being conducted. An adult accompanying a patient to their appointment will only be allowed in the ultrasound room at the discretion of the ultrasound technician under special circumstances. We apologize for any inconvenience.  Follow-Up: At [facility], you and your health needs are our priority.  As part of our continuing mission to provide you with exceptional heart care, our providers are all part of one team.  This team includes your primary  Cardiologist (physician) and Advanced Practice Providers or APPs (Physician Assistants and Nurse Practitioners) who all work together to provide you with the care you need, when you need it.  Your next appointment:   1 month(s) (after PET CT Stress)  Provider:   Scot Ford, PA-C      We recommend signing up for the patient portal called MyChart.  Sign up information is provided on this After Visit Summary.  MyChart is used to connect with patients for Virtual Visits (Telemedicine).  Patients are able to view lab/test results, encounter notes, upcoming appointments, etc.  Non-urgent messages can be sent to your provider as well.   To learn more about what you can do with MyChart, go to [email or web address].       Please report to Radiology at the [facility] Main Entrance 30 minutes early for your test.  [address] [place], [place] [address]                         OR   Please report to Radiology at [facility] Main Entrance, medical mall, 30 mins prior to your test.  [address]  [place], [place]  How to Prepare for Your Cardiac PET/CT Stress Test:  Nothing to eat or drink, except water, 3 hours prior to arrival time.  NO caffeine/decaffeinated products, or chocolate 12 hours prior to arrival. (Please note decaffeinated beverages (teas/coffees) still contain caffeine).  If you have caffeine within 12 hours prior, the test will need to be rescheduled.  Medication instructions: Do not take erectile dysfunction medications for 72  hours prior to test (sildenafil, tadalafil) Do not take nitrates (isosorbide mononitrate, Ranexa) the day before or day of test Do not take tamsulosin the day before or morning of test Hold theophylline containing medications for 12 hours. Hold Dipyridamole 48 hours prior to the test.  Diabetic Preparation: If able to eat breakfast prior to 3 hour fasting, you may take all medications, including your  insulin . Do not worry if you miss your breakfast dose of insulin  - start at your next meal. If you do not eat prior to 3 hour fast-Hold all diabetes (oral and insulin ) medications. Patients who wear a continuous glucose monitor MUST remove the device prior to scanning.  You may take your remaining medications with water.  NO perfume, cologne or lotion on chest or abdomen area. FEMALES - Please avoid wearing dresses to this appointment.  Total time is 1 to 2 hours; you may want to bring reading material for the waiting time.  IF YOU THINK YOU MAY BE PREGNANT, OR ARE NURSING PLEASE INFORM THE TECHNOLOGIST.  In preparation for your appointment, medication and supplies will be purchased.  Appointment availability is limited, so if you need to cancel or reschedule, please call the Radiology Department Scheduler at [phone] 24 hours in advance to avoid a cancellation fee of $100.00  What to Expect When you Arrive:  Once you arrive and check in for your appointment, you will be taken to a preparation room within the Radiology Department.  A technologist or Nurse will obtain your medical history, verify that you are correctly prepped for the exam, and explain the procedure.  Afterwards, an IV will be started in your arm and electrodes will be placed on your skin for EKG monitoring during the stress portion of the exam. Then you will be escorted to the PET/CT scanner.  There, staff will get you positioned on the scanner and obtain a blood pressure and EKG.  During the exam, you will continue to be connected to the EKG and blood pressure machines.  A small, safe amount of a radioactive tracer will be injected in your IV to obtain a series of pictures of your heart along with an injection of a stress agent.    After your Exam:  It is recommended that you eat a meal and drink a caffeinated beverage to counter act any effects of the stress agent.  Drink plenty of fluids for the remainder of the day and  urinate frequently for the first couple of hours after the exam.  Your doctor will inform you of your test results within 7-10 business days.  For more information and frequently asked questions, please visit our website: [email or web address]  For questions about your test or how to prepare for your test, please call: Cardiac Imaging Nurse Navigators Office: [phone]

## 2024-06-27 ENCOUNTER — Ambulatory Visit: Payer: Self-pay

## 2024-07-01 ENCOUNTER — Other Ambulatory Visit: Payer: Self-pay

## 2024-07-01 DIAGNOSIS — E1122 Type 2 diabetes mellitus with diabetic chronic kidney disease: Secondary | ICD-10-CM

## 2024-07-01 MED ORDER — TRULICITY 1.5 MG/0.5ML ~~LOC~~ SOAJ: SUBCUTANEOUS | 3 refills | Status: DC

## 2024-07-01 MED ORDER — TRULICITY 1.5 MG/0.5ML ~~LOC~~ SOAJ: SUBCUTANEOUS | 3 refills | Status: AC

## 2024-07-04 ENCOUNTER — Encounter: Payer: Self-pay | Admitting: *Deleted

## 2024-07-05 ENCOUNTER — Ambulatory Visit (HOSPITAL_COMMUNITY)
Admission: RE | Admit: 2024-07-05 | Discharge: 2024-07-05 | Disposition: A | Discharge status: Home / Self Care | Source: Ambulatory Visit

## 2024-07-05 DIAGNOSIS — I2510 Atherosclerotic heart disease of native coronary artery without angina pectoris: Secondary | ICD-10-CM | POA: Insufficient documentation

## 2024-07-05 DIAGNOSIS — R0609 Other forms of dyspnea: Secondary | ICD-10-CM | POA: Insufficient documentation

## 2024-07-05 LAB — ECHOCARDIOGRAM COMPLETE
AR max vel: 2.45 cm2
AV Area VTI: 2.51 cm2
AV Area mean vel: 2.11 cm2
AV Mean grad: 2 mmHg
AV Peak grad: 4.2 mmHg
Ao pk vel: 1.02 m/s
Area-P 1/2: 4.08 cm2
S' Lateral: 2.86 cm

## 2024-07-06 ENCOUNTER — Ambulatory Visit: Payer: Self-pay

## 2024-07-18 ENCOUNTER — Encounter (HOSPITAL_COMMUNITY): Payer: Self-pay

## 2024-07-19 ENCOUNTER — Ambulatory Visit: Payer: Self-pay | Admitting: Internal Medicine

## 2024-07-20 ENCOUNTER — Encounter (HOSPITAL_COMMUNITY)

## 2024-08-01 ENCOUNTER — Ambulatory Visit: Admitting: Physician Assistant

## 2024-08-01 ENCOUNTER — Ambulatory Visit: Admitting: Physical Therapy

## 2024-08-17 ENCOUNTER — Other Ambulatory Visit (HOSPITAL_COMMUNITY)

## 2024-08-23 ENCOUNTER — Ambulatory Visit: Admitting: Podiatry

## 2024-09-14 ENCOUNTER — Ambulatory Visit: Payer: Self-pay | Admitting: Internal Medicine

## 2024-10-05 ENCOUNTER — Ambulatory Visit: Admitting: Rheumatology

## 2025-03-02 ENCOUNTER — Encounter: Payer: Self-pay | Admitting: Internal Medicine

## 2025-06-13 ENCOUNTER — Ambulatory Visit: Payer: Self-pay

## 2025-06-14 ENCOUNTER — Ambulatory Visit: Payer: Self-pay | Admitting: Internal Medicine
